# Patient Record
Sex: Female | Born: 1935 | Race: White | Hispanic: No | Smoking: Never smoker
Health system: Southern US, Community
[De-identification: ages and names within clinical notes are randomized; demographics above are authoritative.]

## PROBLEM LIST (undated history)

## (undated) DIAGNOSIS — E119 Type 2 diabetes mellitus without complications: Secondary | ICD-10-CM

## (undated) DIAGNOSIS — I10 Essential (primary) hypertension: Secondary | ICD-10-CM

## (undated) DIAGNOSIS — G9341 Metabolic encephalopathy: Secondary | ICD-10-CM

## (undated) DIAGNOSIS — I639 Cerebral infarction, unspecified: Secondary | ICD-10-CM

## (undated) DIAGNOSIS — T7840XA Allergy, unspecified, initial encounter: Secondary | ICD-10-CM

## (undated) DIAGNOSIS — E7800 Pure hypercholesterolemia, unspecified: Secondary | ICD-10-CM

## (undated) DIAGNOSIS — B019 Varicella without complication: Secondary | ICD-10-CM

## (undated) HISTORY — DX: Essential (primary) hypertension: I10

## (undated) HISTORY — DX: Cerebral infarction, unspecified: I63.9

## (undated) HISTORY — DX: Varicella without complication: B01.9

## (undated) HISTORY — DX: Allergy, unspecified, initial encounter: T78.40XA

## (undated) HISTORY — PX: APPENDECTOMY: SHX54

## (undated) HISTORY — DX: Type 2 diabetes mellitus without complications: E11.9

## (undated) HISTORY — DX: Pure hypercholesterolemia, unspecified: E78.00

---
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
## 1968-07-20 HISTORY — PX: DILATION AND CURETTAGE OF UTERUS: SHX78

## 1969-07-20 HISTORY — PX: ABDOMINAL HYSTERECTOMY: SHX81

## 2004-07-22 ENCOUNTER — Ambulatory Visit: Payer: Self-pay | Admitting: Internal Medicine

## 2004-11-14 ENCOUNTER — Ambulatory Visit: Payer: Self-pay | Admitting: Internal Medicine

## 2004-11-17 ENCOUNTER — Ambulatory Visit: Payer: Self-pay | Admitting: Internal Medicine

## 2005-02-03 ENCOUNTER — Ambulatory Visit: Payer: Self-pay | Admitting: Ophthalmology

## 2005-03-17 ENCOUNTER — Ambulatory Visit: Payer: Self-pay | Admitting: Ophthalmology

## 2005-03-25 ENCOUNTER — Ambulatory Visit: Payer: Self-pay | Admitting: Ophthalmology

## 2005-08-12 ENCOUNTER — Ambulatory Visit: Payer: Self-pay | Admitting: Internal Medicine

## 2006-07-21 LAB — HM COLONOSCOPY

## 2006-10-06 ENCOUNTER — Ambulatory Visit: Payer: Self-pay | Admitting: Internal Medicine

## 2006-10-28 ENCOUNTER — Ambulatory Visit: Payer: Self-pay | Admitting: Internal Medicine

## 2007-10-04 ENCOUNTER — Ambulatory Visit: Payer: Self-pay | Admitting: Internal Medicine

## 2007-10-11 ENCOUNTER — Ambulatory Visit: Payer: Self-pay | Admitting: Vascular Surgery

## 2007-11-23 ENCOUNTER — Ambulatory Visit: Payer: Self-pay | Admitting: Internal Medicine

## 2008-11-26 ENCOUNTER — Ambulatory Visit: Payer: Self-pay | Admitting: Internal Medicine

## 2010-01-02 ENCOUNTER — Ambulatory Visit: Payer: Self-pay | Admitting: Internal Medicine

## 2010-01-02 LAB — HM MAMMOGRAPHY

## 2011-01-05 ENCOUNTER — Ambulatory Visit: Payer: Self-pay | Admitting: Internal Medicine

## 2011-09-08 ENCOUNTER — Ambulatory Visit: Payer: Self-pay | Admitting: Internal Medicine

## 2012-07-11 ENCOUNTER — Ambulatory Visit: Payer: Self-pay | Admitting: Internal Medicine

## 2012-07-21 ENCOUNTER — Encounter: Payer: Self-pay | Admitting: Internal Medicine

## 2012-07-21 ENCOUNTER — Ambulatory Visit (INDEPENDENT_AMBULATORY_CARE_PROVIDER_SITE_OTHER): Payer: Medicare Other | Admitting: Internal Medicine

## 2012-07-21 VITALS — BP 146/82 | HR 74 | Temp 98.4°F | Ht 65.0 in | Wt 118.8 lb

## 2012-07-21 DIAGNOSIS — E119 Type 2 diabetes mellitus without complications: Secondary | ICD-10-CM

## 2012-07-21 DIAGNOSIS — I639 Cerebral infarction, unspecified: Secondary | ICD-10-CM

## 2012-07-21 DIAGNOSIS — R5381 Other malaise: Secondary | ICD-10-CM

## 2012-07-21 DIAGNOSIS — Z8673 Personal history of transient ischemic attack (TIA), and cerebral infarction without residual deficits: Secondary | ICD-10-CM | POA: Insufficient documentation

## 2012-07-21 DIAGNOSIS — I10 Essential (primary) hypertension: Secondary | ICD-10-CM

## 2012-07-21 DIAGNOSIS — I6350 Cerebral infarction due to unspecified occlusion or stenosis of unspecified cerebral artery: Secondary | ICD-10-CM

## 2012-07-21 DIAGNOSIS — E7800 Pure hypercholesterolemia, unspecified: Secondary | ICD-10-CM | POA: Insufficient documentation

## 2012-07-21 DIAGNOSIS — R5383 Other fatigue: Secondary | ICD-10-CM

## 2012-07-21 DIAGNOSIS — E1169 Type 2 diabetes mellitus with other specified complication: Secondary | ICD-10-CM | POA: Insufficient documentation

## 2012-07-21 LAB — CBC WITH DIFFERENTIAL/PLATELET
Basophils Absolute: 0 10*3/uL (ref 0.0–0.1)
Basophils Relative: 0.3 % (ref 0.0–3.0)
Eosinophils Absolute: 0.2 10*3/uL (ref 0.0–0.7)
Eosinophils Relative: 2.6 % (ref 0.0–5.0)
HCT: 46.8 % — ABNORMAL HIGH (ref 36.0–46.0)
Hemoglobin: 15.9 g/dL — ABNORMAL HIGH (ref 12.0–15.0)
Lymphocytes Relative: 25.6 % (ref 12.0–46.0)
Lymphs Abs: 1.9 10*3/uL (ref 0.7–4.0)
MCHC: 33.9 g/dL (ref 30.0–36.0)
MCV: 92.9 fl (ref 78.0–100.0)
Monocytes Absolute: 0.7 10*3/uL (ref 0.1–1.0)
Monocytes Relative: 9.3 % (ref 3.0–12.0)
Neutro Abs: 4.6 10*3/uL (ref 1.4–7.7)
Neutrophils Relative %: 62.2 % (ref 43.0–77.0)
Platelets: 124 10*3/uL — ABNORMAL LOW (ref 150.0–400.0)
RBC: 5.04 Mil/uL (ref 3.87–5.11)
RDW: 12.6 % (ref 11.5–14.6)
WBC: 7.4 10*3/uL (ref 4.5–10.5)

## 2012-07-21 LAB — LIPID PANEL
Cholesterol: 155 mg/dL (ref 0–200)
HDL: 41 mg/dL (ref >39.00)
LDL Cholesterol: 78 mg/dL (ref 0–99)
Total CHOL/HDL Ratio: 4
Triglycerides: 180 mg/dL — ABNORMAL HIGH (ref 0.0–149.0)
VLDL: 36 mg/dL (ref 0.0–40.0)

## 2012-07-21 LAB — HEPATIC FUNCTION PANEL
ALT: 18 U/L (ref 0–35)
AST: 25 U/L (ref 0–37)
Albumin: 4.2 g/dL (ref 3.5–5.2)
Alkaline Phosphatase: 77 U/L (ref 39–117)
Bilirubin, Direct: 0.1 mg/dL (ref 0.0–0.3)
Total Bilirubin: 1.2 mg/dL (ref 0.3–1.2)
Total Protein: 8.3 g/dL (ref 6.0–8.3)

## 2012-07-21 LAB — BASIC METABOLIC PANEL
BUN: 17 mg/dL (ref 6–23)
CO2: 28 mEq/L (ref 19–32)
Calcium: 9.5 mg/dL (ref 8.4–10.5)
Chloride: 101 mEq/L (ref 96–112)
Creatinine, Ser: 0.8 mg/dL (ref 0.4–1.2)
GFR: 76.1 mL/min (ref >60.00)
Glucose, Bld: 184 mg/dL — ABNORMAL HIGH (ref 70–99)
Potassium: 4.5 mEq/L (ref 3.5–5.1)
Sodium: 138 mEq/L (ref 135–145)

## 2012-07-21 LAB — TSH: TSH: 3.09 u[IU]/mL (ref 0.35–5.50)

## 2012-07-22 ENCOUNTER — Other Ambulatory Visit (INDEPENDENT_AMBULATORY_CARE_PROVIDER_SITE_OTHER): Payer: Medicare Other

## 2012-07-22 ENCOUNTER — Telehealth: Payer: Self-pay | Admitting: Internal Medicine

## 2012-07-22 DIAGNOSIS — E119 Type 2 diabetes mellitus without complications: Secondary | ICD-10-CM

## 2012-07-22 LAB — HEMOGLOBIN A1C: Hgb A1c MFr Bld: 8.5 % — ABNORMAL HIGH (ref 4.6–6.5)

## 2012-07-22 NOTE — Telephone Encounter (Signed)
I need to add an a1c to her labs drawn (07/21/12).  Dx 250.00.  Thanks.  She had a cbc drawn - so should be able to add.  I apparently accidentally put the a1c for a future order.  Let me know if a problem.

## 2012-07-23 ENCOUNTER — Telehealth: Payer: Self-pay | Admitting: Internal Medicine

## 2012-07-23 ENCOUNTER — Encounter: Payer: Self-pay | Admitting: Internal Medicine

## 2012-07-23 DIAGNOSIS — D696 Thrombocytopenia, unspecified: Secondary | ICD-10-CM

## 2012-07-23 NOTE — Assessment & Plan Note (Signed)
On simvastatin.  Check lipid panel and liver function.   

## 2012-07-23 NOTE — Assessment & Plan Note (Signed)
Blood pressure doing well.  Same medication regimen.  Check metabolic panel.  

## 2012-07-23 NOTE — Telephone Encounter (Signed)
Pt notified of lab results and need for a follow up platelet count.  Pt coming 08/01/12 at 10:00 for follow up lab.  Please put on lab schedule.  Pt aware of appt.

## 2012-07-23 NOTE — Progress Notes (Signed)
  Subjective:    Patient ID: Angela Patterson, female    DOB: 1921/06/16, 79 y.o.   MRN: [id number]  HPI 79 year old female with past history of hypertension, CVA, hypercholesterolemia and diabetes who comes in today for a scheduled follow up.  She states she feels good.  Stays active.  No cardiac symptoms with increased activity or exertion.  Breathing stable.  Bowels doing well.  States her sugars in the am have been running in the 140s.  Have tried her on diabetic medications in the past.   Does not feel as good on these medications.  Overall she feels she is doing well.    Past Medical History  Diagnosis Date  . Hypertension   . Hypercholesterolemia   . Diabetes mellitus   . CVA (cerebral vascular accident)   . Chicken pox   . Allergy     Current Outpatient Prescriptions on File Prior to Visit  Medication Sig Dispense Refill  . Calcium Carbonate-Vitamin D (CALCIUM 600+D) 600-400 MG-UNIT per tablet Take 1 tablet by mouth 2 (two) times daily.      . felodipine (PLENDIL) 5 MG 24 hr tablet Take 5 mg by mouth 2 (two) times daily.      . hydrochlorothiazide (HYDRODIURIL) 25 MG tablet Take 25 mg by mouth daily.      . simvastatin (ZOCOR) 40 MG tablet Take 40 mg by mouth every evening.        Review of Systems Patient denies any headache, lightheadedness or dizziness.  No significant sinus or allergy symptoms.  No chest pain, tightness or palpitations.  No increased shortness of breath, cough or congestion.  No nausea or vomiting.  No abdominal pain or cramping.  No bowel change, such as diarrhea, constipation, BRBPR or melana.  No urine change.        Objective:   Physical Exam Filed Vitals:   07/21/12 1011  BP: 146/82  Pulse: 74  Temp: 98.4 F (36.9 C)   Blood pressure recheck:  49/71  79 year old female in no acute distress.   HEENT:  Nares - clear.  OP- without lesions or erythema.  NECK:  Supple, nontender.  No audible bruit.   HEART:  Appears to be regular. LUNGS:  Without  crackles or wheezing audible.  Respirations even and unlabored.   RADIAL PULSE:  Equal bilaterally.  ABDOMEN:  Soft, nontender.  No audible abdominal bruit.   EXTREMITIES:  No increased edema to be present.                    Assessment & Plan:  CARDIOVASCULAR.  Asymptomatic.  Continue risk factor modification.   HEALTH MAINTENANCE.  Will obtain outside records for review.  Schedule her for her physical.

## 2012-07-23 NOTE — Assessment & Plan Note (Signed)
Low carb diet.  Did not tolerate various diabetic medications.  Will allow her to run a little higher.  She feels good.  Follow. Check metabolic panel and a1c.    

## 2012-07-23 NOTE — Assessment & Plan Note (Signed)
Currently doing well.  Has had no reoccurring problems.  Continue plavix.   

## 2012-07-25 NOTE — Telephone Encounter (Signed)
Appointment made

## 2012-08-01 ENCOUNTER — Other Ambulatory Visit (INDEPENDENT_AMBULATORY_CARE_PROVIDER_SITE_OTHER): Payer: Medicare Other

## 2012-08-01 ENCOUNTER — Other Ambulatory Visit: Payer: Self-pay | Admitting: Internal Medicine

## 2012-08-01 DIAGNOSIS — D696 Thrombocytopenia, unspecified: Secondary | ICD-10-CM

## 2012-08-01 LAB — CBC WITH DIFFERENTIAL/PLATELET
Basophils Absolute: 0 10*3/uL (ref 0.0–0.1)
Basophils Relative: 0.5 % (ref 0.0–3.0)
Eosinophils Absolute: 0.1 10*3/uL (ref 0.0–0.7)
Eosinophils Relative: 2.2 % (ref 0.0–5.0)
HCT: 47.9 % — ABNORMAL HIGH (ref 36.0–46.0)
Hemoglobin: 16.1 g/dL — ABNORMAL HIGH (ref 12.0–15.0)
Lymphocytes Relative: 30 % (ref 12.0–46.0)
Lymphs Abs: 2 10*3/uL (ref 0.7–4.0)
MCHC: 33.6 g/dL (ref 30.0–36.0)
MCV: 92.7 fl (ref 78.0–100.0)
Monocytes Absolute: 0.6 10*3/uL (ref 0.1–1.0)
Monocytes Relative: 9.5 % (ref 3.0–12.0)
Neutro Abs: 3.9 10*3/uL (ref 1.4–7.7)
Neutrophils Relative %: 57.8 % (ref 43.0–77.0)
Platelets: 142 10*3/uL — ABNORMAL LOW (ref 150.0–400.0)
RBC: 5.17 Mil/uL — ABNORMAL HIGH (ref 3.87–5.11)
RDW: 12.9 % (ref 11.5–14.6)
WBC: 6.8 10*3/uL (ref 4.5–10.5)

## 2012-08-01 NOTE — Progress Notes (Signed)
Order placed for follow up lab.  

## 2012-08-04 ENCOUNTER — Encounter: Payer: Self-pay | Admitting: *Deleted

## 2012-09-06 ENCOUNTER — Other Ambulatory Visit: Payer: Self-pay | Admitting: Internal Medicine

## 2012-09-06 NOTE — Telephone Encounter (Signed)
Sent in to pharmacy.  

## 2012-09-15 ENCOUNTER — Other Ambulatory Visit: Payer: Medicare Other

## 2012-09-20 ENCOUNTER — Other Ambulatory Visit: Payer: Medicare Other

## 2012-09-27 ENCOUNTER — Other Ambulatory Visit (INDEPENDENT_AMBULATORY_CARE_PROVIDER_SITE_OTHER): Payer: Medicare Other

## 2012-09-27 DIAGNOSIS — D696 Thrombocytopenia, unspecified: Secondary | ICD-10-CM

## 2012-09-27 LAB — CBC WITH DIFFERENTIAL/PLATELET
Basophils Absolute: 0 10*3/uL (ref 0.0–0.1)
Basophils Relative: 0.6 % (ref 0.0–3.0)
Eosinophils Absolute: 0.1 10*3/uL (ref 0.0–0.7)
Eosinophils Relative: 2 % (ref 0.0–5.0)
HCT: 47.4 % — ABNORMAL HIGH (ref 36.0–46.0)
Hemoglobin: 16.1 g/dL — ABNORMAL HIGH (ref 12.0–15.0)
Lymphocytes Relative: 31.3 % (ref 12.0–46.0)
Lymphs Abs: 2.1 10*3/uL (ref 0.7–4.0)
MCHC: 34 g/dL (ref 30.0–36.0)
MCV: 92.5 fl (ref 78.0–100.0)
Monocytes Absolute: 0.6 10*3/uL (ref 0.1–1.0)
Monocytes Relative: 8.5 % (ref 3.0–12.0)
Neutro Abs: 3.8 10*3/uL (ref 1.4–7.7)
Neutrophils Relative %: 57.6 % (ref 43.0–77.0)
Platelets: 137 10*3/uL — ABNORMAL LOW (ref 150.0–400.0)
RBC: 5.13 Mil/uL — ABNORMAL HIGH (ref 3.87–5.11)
RDW: 13 % (ref 11.5–14.6)
WBC: 6.6 10*3/uL (ref 4.5–10.5)

## 2012-10-06 ENCOUNTER — Other Ambulatory Visit: Payer: Self-pay | Admitting: Internal Medicine

## 2012-10-06 NOTE — Telephone Encounter (Signed)
Sent in to pharmacy.  

## 2012-11-18 ENCOUNTER — Ambulatory Visit (INDEPENDENT_AMBULATORY_CARE_PROVIDER_SITE_OTHER): Payer: Medicare Other | Admitting: Internal Medicine

## 2012-11-18 ENCOUNTER — Encounter: Payer: Self-pay | Admitting: Internal Medicine

## 2012-11-18 VITALS — BP 138/80 | HR 74 | Temp 97.7°F | Ht 62.25 in | Wt 115.5 lb

## 2012-11-18 DIAGNOSIS — I10 Essential (primary) hypertension: Secondary | ICD-10-CM

## 2012-11-18 DIAGNOSIS — I639 Cerebral infarction, unspecified: Secondary | ICD-10-CM

## 2012-11-18 DIAGNOSIS — E7800 Pure hypercholesterolemia, unspecified: Secondary | ICD-10-CM

## 2012-11-18 DIAGNOSIS — D696 Thrombocytopenia, unspecified: Secondary | ICD-10-CM

## 2012-11-18 DIAGNOSIS — I6350 Cerebral infarction due to unspecified occlusion or stenosis of unspecified cerebral artery: Secondary | ICD-10-CM

## 2012-11-18 DIAGNOSIS — E119 Type 2 diabetes mellitus without complications: Secondary | ICD-10-CM

## 2012-11-18 LAB — HEMOGLOBIN A1C: Hgb A1c MFr Bld: 8.5 % — ABNORMAL HIGH (ref 4.6–6.5)

## 2012-11-20 ENCOUNTER — Encounter: Payer: Self-pay | Admitting: Internal Medicine

## 2012-11-20 DIAGNOSIS — D696 Thrombocytopenia, unspecified: Secondary | ICD-10-CM | POA: Insufficient documentation

## 2012-11-20 NOTE — Assessment & Plan Note (Signed)
Low carb diet.  Did not tolerate various diabetic medications.  Will allow her to run a little higher.  She feels good.  Follow. Check metabolic panel and a1c.    

## 2012-11-20 NOTE — Progress Notes (Signed)
Subjective:    Patient ID: Angela Patterson, female    DOB: 06-02-1921, 79 y.o.   MRN: [id number]  HPI 79 year old female with past history of hypertension, CVA, hypercholesterolemia and diabetes who comes in today to follow up on these issues as well as for a complete physical exam.  She states she feels good.  Stays active.  No cardiac symptoms with increased activity or exertion.  Breathing stable.  Bowels doing well.  States her sugars are doing well.  She brought in no recorded sugar readings.  Have tried her on diabetic medications in the past.   Does not feel as good on these medications.  Overall she feels she is doing well.  Has been under increased stress recently.  A good friend of hers just recently passed away unexpectedly.  Also, another good friend - is in Hospice and her brother is sick.  She feels she is coping relatively well.  Does not feel she needs any further intervention.    Past Medical History  Diagnosis Date  . Hypertension   . Hypercholesterolemia   . Diabetes mellitus   . CVA (cerebral vascular accident)   . Chicken pox   . Allergy     Current Outpatient Prescriptions on File Prior to Visit  Medication Sig Dispense Refill  . aspirin 81 MG tablet Take 81 mg by mouth daily.      . Calcium Carbonate-Vitamin D (CALCIUM 600+D) 600-400 MG-UNIT per tablet Take 1 tablet by mouth 2 (two) times daily.      . Cholecalciferol (VITAMIN D3) 2000 UNITS TABS Take 1 tablet by mouth daily.      . clopidogrel (PLAVIX) 75 MG tablet TAKE ONE (1) TABLET EACH DAY  30 tablet  5  . felodipine (PLENDIL) 5 MG 24 hr tablet TAKE ONE TABLET BY MOUTH TWICE DAILY  60 tablet  5  . fish oil-omega-3 fatty acids 1000 MG capsule Take 2 g by mouth daily.      Marland Kitchen glucose blood test strip Contour test strips Check blood sugar bid      . hydrochlorothiazide (HYDRODIURIL) 25 MG tablet Take 25 mg by mouth daily.      . Multiple Vitamin (MULTIVITAMIN) tablet Take 1 tablet by mouth daily.      . simvastatin  (ZOCOR) 40 MG tablet Take 40 mg by mouth every evening.      . timolol (BETIMOL) 0.5 % ophthalmic solution 1 drop 2 (two) times daily.       No current facility-administered medications on file prior to visit.    Review of Systems Patient denies any headache, lightheadedness or dizziness.  No significant sinus or allergy symptoms.  No chest pain, tightness or palpitations.  No increased shortness of breath, cough or congestion.  No nausea or vomiting.  No abdominal pain or cramping.  No bowel change, such as diarrhea, constipation, BRBPR or melana.  No urine change.  Coping well with the increased stress. Overall she feels she is dong well.      Objective:   Physical Exam  Filed Vitals:   11/18/12 1021  BP: 138/80  Pulse: 74  Temp: 97.7 F (36.5 C)   Blood pressure recheck:  42-4/22  79 year old female in no acute distress.   HEENT:  Nares- clear.  Oropharynx - without lesions. NECK:  Supple.  Nontender.  No audible bruit.  HEART:  Appears to be regular. LUNGS:  No crackles or wheezing audible.  Respirations even and unlabored.  RADIAL  PULSE:  Equal bilaterally.    BREASTS:  No nipple discharge or nipple retraction present.  Could not appreciate any distinct nodules or axillary adenopathy.  ABDOMEN:  Soft, nontender.  Bowel sounds present and normal.  No audible abdominal bruit.  GU:  She deferred.    RECTAL:  She deferred.  EXTREMITIES:  No increased edema present.  DP pulses palpable and equal bilaterally.           Assessment & Plan:  CARDIOVASCULAR.  Asymptomatic.  Continue risk factor modification.   INCREASED PSYCHOSOCIAL STRESSORS.  She feels she is doing well.  Handling stress well.  Desires no further intervention.  Will notify me if she feels she needs anything more.   HEALTH MAINTENANCE.  Physical today.  She declines mammogram and further screening.

## 2012-11-20 NOTE — Assessment & Plan Note (Signed)
On simvastatin.  Check lipid panel and liver function.   

## 2012-11-20 NOTE — Assessment & Plan Note (Signed)
Has been relatively stable.  Recheck cbc.   

## 2012-11-20 NOTE — Assessment & Plan Note (Signed)
Currently doing well.  Has had no reoccurring problems.  Continue plavix.   

## 2012-11-20 NOTE — Assessment & Plan Note (Signed)
Blood pressure doing well.  Same medication regimen.  Check metabolic panel.  

## 2012-11-21 LAB — LIPID PANEL
Cholesterol: 158 mg/dL (ref 0–200)
HDL: 47.9 mg/dL (ref >39.00)
LDL Cholesterol: 75 mg/dL (ref 0–99)
Total CHOL/HDL Ratio: 3
Triglycerides: 174 mg/dL — ABNORMAL HIGH (ref 0.0–149.0)
VLDL: 34.8 mg/dL (ref 0.0–40.0)

## 2012-11-21 LAB — BASIC METABOLIC PANEL
BUN: 18 mg/dL (ref 6–23)
CO2: 29 mEq/L (ref 19–32)
Calcium: 9.8 mg/dL (ref 8.4–10.5)
Chloride: 100 mEq/L (ref 96–112)
Creatinine, Ser: 0.8 mg/dL (ref 0.4–1.2)
GFR: 68.69 mL/min (ref >60.00)
Glucose, Bld: 227 mg/dL — ABNORMAL HIGH (ref 70–99)
Potassium: 4.6 mEq/L (ref 3.5–5.1)
Sodium: 138 mEq/L (ref 135–145)

## 2012-11-21 LAB — HEPATIC FUNCTION PANEL
ALT: 15 U/L (ref 0–35)
AST: 26 U/L (ref 0–37)
Albumin: 4.7 g/dL (ref 3.5–5.2)
Alkaline Phosphatase: 83 U/L (ref 39–117)
Bilirubin, Direct: 0.2 mg/dL (ref 0.0–0.3)
Total Bilirubin: 1 mg/dL (ref 0.3–1.2)
Total Protein: 9 g/dL — ABNORMAL HIGH (ref 6.0–8.3)

## 2012-11-26 ENCOUNTER — Telehealth: Payer: Self-pay | Admitting: Internal Medicine

## 2012-11-26 ENCOUNTER — Other Ambulatory Visit: Payer: Self-pay | Admitting: Internal Medicine

## 2012-11-26 DIAGNOSIS — E8809 Other disorders of plasma-protein metabolism, not elsewhere classified: Secondary | ICD-10-CM

## 2012-11-26 DIAGNOSIS — D696 Thrombocytopenia, unspecified: Secondary | ICD-10-CM

## 2012-11-26 NOTE — Telephone Encounter (Signed)
Pt notified of lab results and need for f/u lab in a couple of weeks.  She is planning to come in on 12/07/12 at 10:00 for lab.  Please put on lab schedule.  Pt aware of appt.  Thanks.

## 2012-11-26 NOTE — Progress Notes (Signed)
Order placed for follow up liver panel, cbc and SIEP.

## 2012-11-28 NOTE — Telephone Encounter (Signed)
Appointment made

## 2012-12-07 ENCOUNTER — Other Ambulatory Visit (INDEPENDENT_AMBULATORY_CARE_PROVIDER_SITE_OTHER): Payer: Medicare Other

## 2012-12-07 DIAGNOSIS — E8809 Other disorders of plasma-protein metabolism, not elsewhere classified: Secondary | ICD-10-CM

## 2012-12-07 DIAGNOSIS — D696 Thrombocytopenia, unspecified: Secondary | ICD-10-CM

## 2012-12-07 LAB — HEPATIC FUNCTION PANEL
ALT: 18 U/L (ref 0–35)
AST: 22 U/L (ref 0–37)
Albumin: 3.9 g/dL (ref 3.5–5.2)
Alkaline Phosphatase: 73 U/L (ref 39–117)
Bilirubin, Direct: 0.2 mg/dL (ref 0.0–0.3)
Total Bilirubin: 1.2 mg/dL (ref 0.3–1.2)
Total Protein: 7.1 g/dL (ref 6.0–8.3)

## 2012-12-07 LAB — CBC WITH DIFFERENTIAL/PLATELET
Basophils Absolute: 0 10*3/uL (ref 0.0–0.1)
Basophils Relative: 0.4 % (ref 0.0–3.0)
Eosinophils Absolute: 0.1 10*3/uL (ref 0.0–0.7)
Eosinophils Relative: 1.3 % (ref 0.0–5.0)
HCT: 44 % (ref 36.0–46.0)
Hemoglobin: 15 g/dL (ref 12.0–15.0)
Lymphocytes Relative: 23.2 % (ref 12.0–46.0)
Lymphs Abs: 2.2 10*3/uL (ref 0.7–4.0)
MCHC: 34.1 g/dL (ref 30.0–36.0)
MCV: 92.1 fl (ref 78.0–100.0)
Monocytes Absolute: 0.7 10*3/uL (ref 0.1–1.0)
Monocytes Relative: 7.9 % (ref 3.0–12.0)
Neutro Abs: 6.2 10*3/uL (ref 1.4–7.7)
Neutrophils Relative %: 67.2 % (ref 43.0–77.0)
Platelets: 121 10*3/uL — ABNORMAL LOW (ref 150.0–400.0)
RBC: 4.77 Mil/uL (ref 3.87–5.11)
RDW: 12.9 % (ref 11.5–14.6)
WBC: 9.3 10*3/uL (ref 4.5–10.5)

## 2012-12-09 LAB — PROTEIN ELECTROPHORESIS, SERUM
Albumin ELP: 54.5 % — ABNORMAL LOW (ref 55.8–66.1)
Alpha-1-Globulin: 3.6 % (ref 2.9–4.9)
Alpha-2-Globulin: 12.9 % — ABNORMAL HIGH (ref 7.1–11.8)
Beta 2: 8.2 % — ABNORMAL HIGH (ref 3.2–6.5)
Beta Globulin: 6.9 % (ref 4.7–7.2)
Gamma Globulin: 13.9 % (ref 11.1–18.8)
Total Protein, Serum Electrophoresis: 7.3 g/dL (ref 6.0–8.3)

## 2012-12-11 ENCOUNTER — Telehealth: Payer: Self-pay | Admitting: Internal Medicine

## 2012-12-11 DIAGNOSIS — D696 Thrombocytopenia, unspecified: Secondary | ICD-10-CM

## 2012-12-11 NOTE — Telephone Encounter (Signed)
Pt notified of lab results and need for f/u platelet count check in one month.  Pt coming in 01/11/13 at 9:30 for repeat lab.  Please put on lab schedule.  Pt aware of appt date and time.  Thanks.

## 2012-12-14 NOTE — Telephone Encounter (Signed)
Appointment made

## 2012-12-26 ENCOUNTER — Other Ambulatory Visit: Payer: Self-pay | Admitting: Internal Medicine

## 2013-01-11 ENCOUNTER — Other Ambulatory Visit (INDEPENDENT_AMBULATORY_CARE_PROVIDER_SITE_OTHER): Payer: Medicare Other

## 2013-01-11 DIAGNOSIS — D696 Thrombocytopenia, unspecified: Secondary | ICD-10-CM

## 2013-01-12 LAB — PLATELET COUNT: Platelets: 157 10*3/uL (ref 150–400)

## 2013-01-30 ENCOUNTER — Encounter: Payer: Self-pay | Admitting: Internal Medicine

## 2013-02-18 ENCOUNTER — Other Ambulatory Visit: Payer: Self-pay | Admitting: Internal Medicine

## 2013-03-28 ENCOUNTER — Other Ambulatory Visit: Payer: Self-pay | Admitting: Internal Medicine

## 2013-05-17 LAB — HM DIABETES EYE EXAM

## 2013-05-24 ENCOUNTER — Ambulatory Visit (INDEPENDENT_AMBULATORY_CARE_PROVIDER_SITE_OTHER): Payer: Medicare Other | Admitting: Internal Medicine

## 2013-05-24 ENCOUNTER — Encounter: Payer: Self-pay | Admitting: Internal Medicine

## 2013-05-24 VITALS — BP 130/70 | HR 72 | Temp 97.8°F | Ht 62.25 in | Wt 115.2 lb

## 2013-05-24 DIAGNOSIS — I639 Cerebral infarction, unspecified: Secondary | ICD-10-CM

## 2013-05-24 DIAGNOSIS — I6350 Cerebral infarction due to unspecified occlusion or stenosis of unspecified cerebral artery: Secondary | ICD-10-CM

## 2013-05-24 DIAGNOSIS — E119 Type 2 diabetes mellitus without complications: Secondary | ICD-10-CM

## 2013-05-24 DIAGNOSIS — I10 Essential (primary) hypertension: Secondary | ICD-10-CM

## 2013-05-24 DIAGNOSIS — E7800 Pure hypercholesterolemia, unspecified: Secondary | ICD-10-CM

## 2013-05-24 DIAGNOSIS — D696 Thrombocytopenia, unspecified: Secondary | ICD-10-CM

## 2013-05-24 LAB — LIPID PANEL
Cholesterol: 128 mg/dL (ref 0–200)
HDL: 42.4 mg/dL (ref >39.00)
Total CHOL/HDL Ratio: 3
Triglycerides: 206 mg/dL — ABNORMAL HIGH (ref 0.0–149.0)
VLDL: 41.2 mg/dL — ABNORMAL HIGH (ref 0.0–40.0)

## 2013-05-24 LAB — CBC WITH DIFFERENTIAL/PLATELET
Basophils Absolute: 0.1 10*3/uL (ref 0.0–0.1)
Basophils Relative: 0.7 % (ref 0.0–3.0)
Eosinophils Absolute: 0.1 10*3/uL (ref 0.0–0.7)
Eosinophils Relative: 1 % (ref 0.0–5.0)
HCT: 45.4 % (ref 36.0–46.0)
Hemoglobin: 15.6 g/dL — ABNORMAL HIGH (ref 12.0–15.0)
Lymphocytes Relative: 28.3 % (ref 12.0–46.0)
Lymphs Abs: 2.4 10*3/uL (ref 0.7–4.0)
MCHC: 34.3 g/dL (ref 30.0–36.0)
MCV: 92.3 fl (ref 78.0–100.0)
Monocytes Absolute: 0.7 10*3/uL (ref 0.1–1.0)
Monocytes Relative: 8.1 % (ref 3.0–12.0)
Neutro Abs: 5.2 10*3/uL (ref 1.4–7.7)
Neutrophils Relative %: 61.9 % (ref 43.0–77.0)
Platelets: 124 10*3/uL — ABNORMAL LOW (ref 150.0–400.0)
RBC: 4.92 Mil/uL (ref 3.87–5.11)
RDW: 12.2 % (ref 11.5–14.6)
WBC: 8.5 10*3/uL (ref 4.5–10.5)

## 2013-05-24 LAB — LDL CHOLESTEROL, DIRECT: Direct LDL: 66.4 mg/dL

## 2013-05-24 LAB — MICROALBUMIN / CREATININE URINE RATIO
Creatinine,U: 120.8 mg/dL
Microalb Creat Ratio: 1.5 mg/g (ref 0.0–30.0)
Microalb, Ur: 1.8 mg/dL (ref 0.0–1.9)

## 2013-05-24 LAB — HEPATIC FUNCTION PANEL
ALT: 27 U/L (ref 0–35)
AST: 28 U/L (ref 0–37)
Albumin: 4.4 g/dL (ref 3.5–5.2)
Alkaline Phosphatase: 68 U/L (ref 39–117)
Bilirubin, Direct: 0.2 mg/dL (ref 0.0–0.3)
Total Bilirubin: 1.1 mg/dL (ref 0.3–1.2)
Total Protein: 8.4 g/dL — ABNORMAL HIGH (ref 6.0–8.3)

## 2013-05-24 LAB — HEMOGLOBIN A1C: Hgb A1c MFr Bld: 8.5 % — ABNORMAL HIGH (ref 4.6–6.5)

## 2013-05-24 LAB — HM DIABETES FOOT EXAM

## 2013-05-24 LAB — BASIC METABOLIC PANEL
BUN: 15 mg/dL (ref 6–23)
CO2: 28 mEq/L (ref 19–32)
Calcium: 10 mg/dL (ref 8.4–10.5)
Chloride: 100 mEq/L (ref 96–112)
Creatinine, Ser: 0.6 mg/dL (ref 0.4–1.2)
GFR: 97.89 mL/min (ref >60.00)
Glucose, Bld: 172 mg/dL — ABNORMAL HIGH (ref 70–99)
Potassium: 3.9 mEq/L (ref 3.5–5.1)
Sodium: 137 mEq/L (ref 135–145)

## 2013-05-24 NOTE — Progress Notes (Signed)
Pre-visit discussion using our clinic review tool. No additional management support is needed unless otherwise documented below in the visit note.  

## 2013-05-25 ENCOUNTER — Other Ambulatory Visit: Payer: Self-pay | Admitting: Internal Medicine

## 2013-05-25 ENCOUNTER — Encounter: Payer: Self-pay | Admitting: *Deleted

## 2013-05-25 DIAGNOSIS — D696 Thrombocytopenia, unspecified: Secondary | ICD-10-CM

## 2013-05-25 DIAGNOSIS — E8809 Other disorders of plasma-protein metabolism, not elsewhere classified: Secondary | ICD-10-CM

## 2013-05-25 NOTE — Progress Notes (Signed)
Order placed for f/u labs.  

## 2013-05-28 ENCOUNTER — Encounter: Payer: Self-pay | Admitting: Internal Medicine

## 2013-05-28 NOTE — Assessment & Plan Note (Signed)
Blood pressure doing well.  Same medication regimen.  Check metabolic panel.  

## 2013-05-28 NOTE — Assessment & Plan Note (Signed)
Low carb diet.  Did not tolerate various diabetic medications.  Will allow her to run a little higher.  She feels good.  Follow. Check metabolic panel and a1c.    

## 2013-05-28 NOTE — Assessment & Plan Note (Signed)
Currently doing well.  Has had no reoccurring problems.  Continue plavix.   

## 2013-05-28 NOTE — Assessment & Plan Note (Signed)
Has been relatively stable.  Recheck cbc.   

## 2013-05-28 NOTE — Assessment & Plan Note (Signed)
On simvastatin.  Check lipid panel and liver function.   

## 2013-05-28 NOTE — Progress Notes (Signed)
Subjective:    Patient ID: Angela Patterson, female    DOB: 02-25-1921, 79 y.o.   MRN: [id number]  HPI 79 year old female with past history of hypertension, CVA, hypercholesterolemia and diabetes who comes in today for a scheduled follow up.   She states she feels good.  Stays active.  No cardiac symptoms with increased activity or exertion. Breathing stable.  Bowels doing well.  States her sugars are doing well.  She brought in no recorded sugar readings.  Have tried her on diabetic medications in the past.   Does not feel as good on these medications.  Overall she feels she is doing well.  Handling stress well.  Had her eyes checked last week.  Sees Dr Angela Patterson.    Past Medical History  Diagnosis Date  . Hypertension   . Hypercholesterolemia   . Diabetes mellitus   . CVA (cerebral vascular accident)   . Chicken pox   . Allergy     Current Outpatient Prescriptions on File Prior to Visit  Medication Sig Dispense Refill  . aspirin 81 MG tablet Take 81 mg by mouth daily.      . Calcium Carbonate-Vitamin D (CALCIUM 600+D) 600-400 MG-UNIT per tablet Take 1 tablet by mouth 2 (two) times daily.      . Cholecalciferol (VITAMIN D3) 2000 UNITS TABS Take 1 tablet by mouth daily.      . clopidogrel (PLAVIX) 75 MG tablet TAKE ONE (1) TABLET BY MOUTH EVERY DAY  30 tablet  5  . felodipine (PLENDIL) 5 MG 24 hr tablet TAKE ONE TABLET TWICE DAILY  60 tablet  5  . fish oil-omega-3 fatty acids 1000 MG capsule Take 2 g by mouth daily.      Marland Kitchen glucose blood test strip TEST BLOOD GLUCOSE LEVELS TWICE DAILY  200 each  5  . hydrochlorothiazide (HYDRODIURIL) 25 MG tablet Take 25 mg by mouth daily.      . Multiple Vitamin (MULTIVITAMIN) tablet Take 1 tablet by mouth daily.      . simvastatin (ZOCOR) 40 MG tablet TAKE ONE TABLET DAILY AT BEDTIME  30 tablet  5  . timolol (BETIMOL) 0.5 % ophthalmic solution 1 drop 2 (two) times daily.       No current facility-administered medications on file prior to visit.     Review of Systems Patient denies any headache, lightheadedness or dizziness.  No significant sinus or allergy symptoms.  No chest pain, tightness or palpitations.  No increased shortness of breath, cough or congestion.  No nausea or vomiting.  No abdominal pain or cramping.  No bowel change, such as diarrhea, constipation, BRBPR or melana.  No urine change.  Coping well with the increased stress. Overall she feels she is dong well.      Objective:   Physical Exam  Filed Vitals:   05/24/13 1100  BP: 130/70  Pulse: 72  Temp: 97.8 F (36.6 C)   Blood pressure recheck:  63/61  79 year old female in no acute distress.   HEENT:  Nares- clear.  Oropharynx - without lesions. NECK:  Supple.  Nontender.  No audible bruit.  HEART:  Appears to be regular. LUNGS:  No crackles or wheezing audible.  Respirations even and unlabored.  RADIAL PULSE:  Equal bilaterally.     ABDOMEN:  Soft, nontender.  Bowel sounds present and normal.  No audible abdominal bruit.   EXTREMITIES:  No increased edema present.  DP pulses palpable and equal bilaterally.   FEET:  Without  lesions.          Assessment & Plan:  CARDIOVASCULAR.  Asymptomatic.  Continue risk factor modification.   INCREASED PSYCHOSOCIAL STRESSORS.  She feels she is doing well.  Handling stress well.  Desires no further intervention.  Will notify me if she feels she needs anything more.   HEALTH MAINTENANCE.  Physical 11/18/12.  She declines mammogram and further screening.

## 2013-07-06 ENCOUNTER — Other Ambulatory Visit (INDEPENDENT_AMBULATORY_CARE_PROVIDER_SITE_OTHER): Payer: Medicare Other

## 2013-07-06 DIAGNOSIS — D696 Thrombocytopenia, unspecified: Secondary | ICD-10-CM

## 2013-07-06 DIAGNOSIS — E8809 Other disorders of plasma-protein metabolism, not elsewhere classified: Secondary | ICD-10-CM

## 2013-07-06 LAB — CBC WITH DIFFERENTIAL/PLATELET
Basophils Absolute: 0 10*3/uL (ref 0.0–0.1)
Basophils Relative: 0.5 % (ref 0.0–3.0)
Eosinophils Absolute: 0.1 10*3/uL (ref 0.0–0.7)
Eosinophils Relative: 1.7 % (ref 0.0–5.0)
HCT: 45.7 % (ref 36.0–46.0)
Hemoglobin: 15.5 g/dL — ABNORMAL HIGH (ref 12.0–15.0)
Lymphocytes Relative: 28 % (ref 12.0–46.0)
Lymphs Abs: 2.2 10*3/uL (ref 0.7–4.0)
MCHC: 33.9 g/dL (ref 30.0–36.0)
MCV: 93.2 fl (ref 78.0–100.0)
Monocytes Absolute: 0.7 10*3/uL (ref 0.1–1.0)
Monocytes Relative: 8.9 % (ref 3.0–12.0)
Neutro Abs: 4.8 10*3/uL (ref 1.4–7.7)
Neutrophils Relative %: 60.9 % (ref 43.0–77.0)
Platelets: 140 10*3/uL — ABNORMAL LOW (ref 150.0–400.0)
RBC: 4.91 Mil/uL (ref 3.87–5.11)
RDW: 12.9 % (ref 11.5–14.6)
WBC: 7.8 10*3/uL (ref 4.5–10.5)

## 2013-07-06 LAB — PROTEIN, TOTAL: Total Protein: 7.9 g/dL (ref 6.0–8.3)

## 2013-07-07 ENCOUNTER — Encounter: Payer: Self-pay | Admitting: *Deleted

## 2013-07-26 ENCOUNTER — Other Ambulatory Visit: Payer: Self-pay | Admitting: Internal Medicine

## 2013-08-07 ENCOUNTER — Telehealth: Payer: Self-pay | Admitting: Internal Medicine

## 2013-08-07 NOTE — Telephone Encounter (Signed)
Please advise see below.

## 2013-08-07 NOTE — Telephone Encounter (Signed)
It is ok to change her felodipine (plendil) to amlodipine 5mg  bid.

## 2013-08-07 NOTE — Telephone Encounter (Signed)
Asking [place] to call: Angela Patterson DOB 02/12/1966, granddaughter  Pt [phone]  States if there is a problem with HIPAA to please call the pt, but is afraid the pt may get confused.    States the pt is on felodipine, which is a tier 3 drug with her insurance company.  States they have spoken with her insurance company and were given suggestions of: Nifedipine EX - tier 2 Amlodipine immediate release - tier 1  Has allergic reactions to some meds but is concerned about reactions.  Would like Dr. Nicki Reaper to check into this and see if it is possible to switch the medication for cost reasons, but be sure she will not have a reaction.  Pt had f/u w/ Dr. Regarding her foot.  She is not sure of the outcome at this point.

## 2013-08-09 ENCOUNTER — Other Ambulatory Visit: Payer: Self-pay | Admitting: *Deleted

## 2013-08-09 MED ORDER — AMLODIPINE BESYLATE 5 MG PO TABS: 5.0000 mg | ORAL_TABLET | Freq: Two times a day (BID) | ORAL | Status: DC

## 2013-08-09 NOTE — Telephone Encounter (Signed)
Sent in Amlodipine to pharmacy & granddaughter aware

## 2013-08-18 ENCOUNTER — Other Ambulatory Visit: Payer: Self-pay | Admitting: *Deleted

## 2013-08-18 MED ORDER — PRAVASTATIN SODIUM 40 MG PO TABS: 40.0000 mg | ORAL_TABLET | Freq: Every day | ORAL | Status: DC

## 2013-09-11 ENCOUNTER — Telehealth: Payer: Self-pay | Admitting: *Deleted

## 2013-09-11 NOTE — Telephone Encounter (Signed)
C/O leg cramps-both legs. since changing two of her medications. (Amlodipine & Pravastatin). She has been on them 1 week now. Please advise

## 2013-09-11 NOTE — Telephone Encounter (Signed)
The amlodipine is the same type of medication she had been taking.  The cholesterol medication is more likely to cause cramps.  I would have her hold the pravastatin over the next few weeks.  See if cramps subside.  Will need to let us know either way.

## 2013-09-11 NOTE — Telephone Encounter (Signed)
Pt notified & will give an update at upcoming appt on 09/25/13

## 2013-09-25 ENCOUNTER — Ambulatory Visit (INDEPENDENT_AMBULATORY_CARE_PROVIDER_SITE_OTHER): Payer: Medicare Other | Admitting: Internal Medicine

## 2013-09-25 ENCOUNTER — Other Ambulatory Visit: Payer: Self-pay | Admitting: Internal Medicine

## 2013-09-25 ENCOUNTER — Encounter: Payer: Self-pay | Admitting: Internal Medicine

## 2013-09-25 VITALS — BP 130/70 | HR 68 | Temp 97.8°F | Ht 62.25 in | Wt 110.2 lb

## 2013-09-25 DIAGNOSIS — D696 Thrombocytopenia, unspecified: Secondary | ICD-10-CM

## 2013-09-25 DIAGNOSIS — R5383 Other fatigue: Secondary | ICD-10-CM

## 2013-09-25 DIAGNOSIS — E7800 Pure hypercholesterolemia, unspecified: Secondary | ICD-10-CM

## 2013-09-25 DIAGNOSIS — I10 Essential (primary) hypertension: Secondary | ICD-10-CM

## 2013-09-25 DIAGNOSIS — I6350 Cerebral infarction due to unspecified occlusion or stenosis of unspecified cerebral artery: Secondary | ICD-10-CM

## 2013-09-25 DIAGNOSIS — I639 Cerebral infarction, unspecified: Secondary | ICD-10-CM

## 2013-09-25 DIAGNOSIS — R5381 Other malaise: Secondary | ICD-10-CM

## 2013-09-25 DIAGNOSIS — E119 Type 2 diabetes mellitus without complications: Secondary | ICD-10-CM

## 2013-09-25 LAB — CBC WITH DIFFERENTIAL/PLATELET
Basophils Absolute: 0.1 10*3/uL (ref 0.0–0.1)
Basophils Relative: 0.7 % (ref 0.0–3.0)
Eosinophils Absolute: 0.2 10*3/uL (ref 0.0–0.7)
Eosinophils Relative: 2.5 % (ref 0.0–5.0)
HCT: 46.5 % — ABNORMAL HIGH (ref 36.0–46.0)
Hemoglobin: 15.5 g/dL — ABNORMAL HIGH (ref 12.0–15.0)
Lymphocytes Relative: 23.3 % (ref 12.0–46.0)
Lymphs Abs: 1.7 10*3/uL (ref 0.7–4.0)
MCHC: 33.3 g/dL (ref 30.0–36.0)
MCV: 93.8 fl (ref 78.0–100.0)
Monocytes Absolute: 0.6 10*3/uL (ref 0.1–1.0)
Monocytes Relative: 8.8 % (ref 3.0–12.0)
Neutro Abs: 4.6 10*3/uL (ref 1.4–7.7)
Neutrophils Relative %: 64.7 % (ref 43.0–77.0)
Platelets: 117 10*3/uL — ABNORMAL LOW (ref 150.0–400.0)
RBC: 4.96 Mil/uL (ref 3.87–5.11)
RDW: 13.1 % (ref 11.5–14.6)
WBC: 7.1 10*3/uL (ref 4.5–10.5)

## 2013-09-25 LAB — BASIC METABOLIC PANEL
BUN: 11 mg/dL (ref 6–23)
CO2: 28 mEq/L (ref 19–32)
Calcium: 9.7 mg/dL (ref 8.4–10.5)
Chloride: 99 mEq/L (ref 96–112)
Creatinine, Ser: 0.7 mg/dL (ref 0.4–1.2)
GFR: 87.78 mL/min (ref >60.00)
Glucose, Bld: 251 mg/dL — ABNORMAL HIGH (ref 70–99)
Potassium: 4.3 mEq/L (ref 3.5–5.1)
Sodium: 137 mEq/L (ref 135–145)

## 2013-09-25 LAB — HEPATIC FUNCTION PANEL
ALT: 24 U/L (ref 0–35)
AST: 26 U/L (ref 0–37)
Albumin: 4.1 g/dL (ref 3.5–5.2)
Alkaline Phosphatase: 65 U/L (ref 39–117)
Bilirubin, Direct: 0.2 mg/dL (ref 0.0–0.3)
Total Bilirubin: 1.4 mg/dL — ABNORMAL HIGH (ref 0.3–1.2)
Total Protein: 7.6 g/dL (ref 6.0–8.3)

## 2013-09-25 LAB — LIPID PANEL
Cholesterol: 183 mg/dL (ref 0–200)
HDL: 45.1 mg/dL (ref >39.00)
LDL Cholesterol: 100 mg/dL — ABNORMAL HIGH (ref 0–99)
Total CHOL/HDL Ratio: 4
Triglycerides: 191 mg/dL — ABNORMAL HIGH (ref 0.0–149.0)
VLDL: 38.2 mg/dL (ref 0.0–40.0)

## 2013-09-25 LAB — HEMOGLOBIN A1C: Hgb A1c MFr Bld: 10 % — ABNORMAL HIGH (ref 4.6–6.5)

## 2013-09-25 LAB — TSH: TSH: 2.69 u[IU]/mL (ref 0.35–5.50)

## 2013-09-25 NOTE — Assessment & Plan Note (Signed)
Low carb diet.  Did not tolerate various diabetic medications.  Will allow her to run a little higher.  She feels good.  Follow. Check metabolic panel and U9W.

## 2013-09-25 NOTE — Assessment & Plan Note (Signed)
Off simvastatin and now off pravastatin.  Pravastatin was causing cramps.  Cramps better now.   Check lipid panel and liver function.   Follow up in 2-3 weeks for reevaluation.  If continuing to do well, will try a different cholesterol medication.

## 2013-09-25 NOTE — Assessment & Plan Note (Signed)
Currently doing well.  Has had no reoccurring problems.  Continue plavix.   

## 2013-09-25 NOTE — Assessment & Plan Note (Signed)
Blood pressure doing well.  Same medication regimen.  Check metabolic panel.  

## 2013-09-25 NOTE — Assessment & Plan Note (Signed)
Has been relatively stable.  Recheck cbc.   

## 2013-09-25 NOTE — Progress Notes (Signed)
Pre-visit discussion using our clinic review tool. No additional management support is needed unless otherwise documented below in the visit note.  

## 2013-09-25 NOTE — Progress Notes (Signed)
Subjective:    Patient ID: Angela Patterson, female    DOB: 28-Dec-1920, 80 y.o.   MRN: [id number]  HPI 80 year old female with past history of hypertension, CVA, hypercholesterolemia and diabetes who comes in today for a scheduled follow up.   She states she feels good.  Stays active.  No cardiac symptoms with increased activity or exertion. Breathing stable.  Bowels doing well.  States her sugars are doing well.  She brought in no recorded sugar readings.  Sates sugars are averaging 160 both in the am and pm.  Have tried her on diabetic medications in the past.   Does not feel as good on these medications.  Overall she feels she is doing well.  Handling stress well.  Sees Dr Wallace Going for her eye exams.  Stopped her cholesterol medication recently secondary to cramps.  Feeling better.     Past Medical History  Diagnosis Date  . Hypertension   . Hypercholesterolemia   . Diabetes mellitus   . CVA (cerebral vascular accident)   . Chicken pox   . Allergy     Current Outpatient Prescriptions on File Prior to Visit  Medication Sig Dispense Refill  . amLODipine (NORVASC) 5 MG tablet Take 1 tablet (5 mg total) by mouth 2 (two) times daily.  60 tablet  5  . aspirin 81 MG tablet Take 81 mg by mouth daily.      . Calcium Carbonate-Vitamin D (CALCIUM 600+D) 600-400 MG-UNIT per tablet Take 1 tablet by mouth 2 (two) times daily.      . Cholecalciferol (VITAMIN D3) 2000 UNITS TABS Take 1 tablet by mouth daily.      . clopidogrel (PLAVIX) 75 MG tablet TAKE ONE (1) TABLET BY MOUTH EVERY DAY  30 tablet  5  . felodipine (PLENDIL) 5 MG 24 hr tablet TAKE ONE TABLET TWICE DAILY  60 tablet  5  . fish oil-omega-3 fatty acids 1000 MG capsule Take 2 g by mouth daily.      Marland Kitchen glucose blood test strip TEST BLOOD GLUCOSE LEVELS TWICE DAILY  200 each  5  . hydrochlorothiazide (HYDRODIURIL) 25 MG tablet Take 25 mg by mouth daily.      . Multiple Vitamin (MULTIVITAMIN) tablet Take 1 tablet by mouth daily.      . timolol  (BETIMOL) 0.5 % ophthalmic solution 1 drop 2 (two) times daily.       No current facility-administered medications on file prior to visit.    Review of Systems Patient denies any headache, lightheadedness or dizziness.  No significant sinus or allergy symptoms.  No chest pain, tightness or palpitations.  No increased shortness of breath, cough or congestion.  No nausea or vomiting.  No abdominal pain or cramping.  No bowel change, such as diarrhea, constipation, BRBPR or melana.  No urine change.  Coping well with the increased stress. Overall she feels she is dong well.  Had increased cramps with changing from simvastatin to pravastatin.  Off pravastatin now and cramps are better.       Objective:   Physical Exam  Filed Vitals:   09/25/13 0834  BP: 130/70  Pulse: 68  Temp: 97.8 F (36.6 C)   Blood pressure recheck:  24/38  80 year old female in no acute distress.   HEENT:  Nares- clear.  Oropharynx - without lesions. NECK:  Supple.  Nontender.  No audible bruit.  HEART:  Appears to be regular. LUNGS:  No crackles or wheezing audible.  Respirations even  and unlabored.  RADIAL PULSE:  Equal bilaterally.     ABDOMEN:  Soft, nontender.  Bowel sounds present and normal.  No audible abdominal bruit.   EXTREMITIES:  No increased edema present.  DP pulses palpable and equal bilaterally.   FEET:  Without open lesions.          Assessment & Plan:  CARDIOVASCULAR.  Asymptomatic.  Continue risk factor modification.   INCREASED PSYCHOSOCIAL STRESSORS.  She feels she is doing well.  Handling stress well.  Desires no further intervention.  Will notify me if she feels she needs anything more.   HEALTH MAINTENANCE.  Physical 11/18/12.  She declines mammogram and further screening.

## 2013-10-19 ENCOUNTER — Encounter: Payer: Self-pay | Admitting: Internal Medicine

## 2013-10-19 ENCOUNTER — Ambulatory Visit (INDEPENDENT_AMBULATORY_CARE_PROVIDER_SITE_OTHER): Payer: Medicare Other | Admitting: Internal Medicine

## 2013-10-19 VITALS — BP 130/80 | HR 74 | Temp 97.9°F | Ht 62.25 in | Wt 108.0 lb

## 2013-10-19 DIAGNOSIS — I10 Essential (primary) hypertension: Secondary | ICD-10-CM

## 2013-10-19 DIAGNOSIS — E119 Type 2 diabetes mellitus without complications: Secondary | ICD-10-CM

## 2013-10-19 DIAGNOSIS — E7800 Pure hypercholesterolemia, unspecified: Secondary | ICD-10-CM

## 2013-10-19 MED ORDER — METFORMIN HCL 500 MG PO TABS: 500.0000 mg | ORAL_TABLET | Freq: Every day | ORAL | Status: DC

## 2013-10-19 NOTE — Progress Notes (Signed)
Pre-visit discussion using our clinic review tool. No additional management support is needed unless otherwise documented below in the visit note.  

## 2013-10-22 ENCOUNTER — Encounter: Payer: Self-pay | Admitting: Internal Medicine

## 2013-10-22 NOTE — Assessment & Plan Note (Signed)
Blood pressure doing well.  Same medication regimen.  Follow metabolic panel.   

## 2013-10-22 NOTE — Assessment & Plan Note (Signed)
Off simvastatin and now off pravastatin.  Pravastatin was causing cramps.  Cramps better now.   Remain off for now.  Once cramps have subsided, will try another cholesterol medication.

## 2013-10-22 NOTE — Progress Notes (Signed)
Subjective:    Patient ID: Angela Patterson, female    DOB: 12-Sep-1920, 80 y.o.   MRN: [id number]  HPI 80 year old female with past history of hypertension, CVA, hypercholesterolemia and diabetes who comes in today for a scheduled follow up.  Here to discuss her blood sugars.  She states she feels good.  Stays active.  No cardiac symptoms with increased activity or exertion. Breathing stable.  Bowels doing well.  Brought in sugar readings from home.  AM sugars averaging 170-220s and PM sugars averaging 170-230.  A1c recently elevated to 10.0.  Higher than it has ever been.  Trying to watch what she eats.  She is off her cholesterol medication.  Cramps are better.  Still present occasionally, but better.     Past Medical History  Diagnosis Date  . Hypertension   . Hypercholesterolemia   . Diabetes mellitus   . CVA (cerebral vascular accident)   . Chicken pox   . Allergy     Current Outpatient Prescriptions on File Prior to Visit  Medication Sig Dispense Refill  . amLODipine (NORVASC) 5 MG tablet Take 1 tablet (5 mg total) by mouth 2 (two) times daily.  60 tablet  5  . aspirin 81 MG tablet Take 81 mg by mouth daily.      . Calcium Carbonate-Vitamin D (CALCIUM 600+D) 600-400 MG-UNIT per tablet Take 1 tablet by mouth 2 (two) times daily.      . Cholecalciferol (VITAMIN D3) 2000 UNITS TABS Take 1 tablet by mouth daily.      . clopidogrel (PLAVIX) 75 MG tablet TAKE ONE (1) TABLET EACH DAY  30 tablet  5  . felodipine (PLENDIL) 5 MG 24 hr tablet TAKE ONE TABLET TWICE DAILY  60 tablet  5  . fish oil-omega-3 fatty acids 1000 MG capsule Take 2 g by mouth daily.      Marland Kitchen glucose blood test strip TEST BLOOD GLUCOSE LEVELS TWICE DAILY  200 each  5  . hydrochlorothiazide (HYDRODIURIL) 25 MG tablet Take 25 mg by mouth daily.      . Multiple Vitamin (MULTIVITAMIN) tablet Take 1 tablet by mouth daily.      . timolol (BETIMOL) 0.5 % ophthalmic solution 1 drop 2 (two) times daily.       No current  facility-administered medications on file prior to visit.    Review of Systems Patient denies any headache, lightheadedness or dizziness.  No significant sinus or allergy symptoms.  No chest pain, tightness or palpitations.  No increased shortness of breath, cough or congestion.  No nausea or vomiting.  No abdominal pain or cramping.  No bowel change, such as diarrhea, constipation, BRBPR or melana.  No urine change.   Had increased cramps with changing from simvastatin to pravastatin.  Off pravastatin now and cramps are better.  Still present some , but better.  Sugars as outlined.        Objective:   Physical Exam  Filed Vitals:   10/19/13 1453  BP: 130/80  Pulse: 74  Temp: 97.9 F (47.82 C)   80 year old female in no acute distress.  NECK:  Supple.  Nontender.  No audible bruit.  HEART:  Appears to be regular. LUNGS:  No crackles or wheezing audible.  Respirations even and unlabored.  RADIAL PULSE:  Equal bilaterally.     ABDOMEN:  Soft, nontender.  Bowel sounds present and normal.  No audible abdominal bruit.          Assessment &  Plan:  CARDIOVASCULAR.  Asymptomatic.  Continue risk factor modification.   HEALTH MAINTENANCE.  Physical 11/18/12.  She declines mammogram and further screening.

## 2013-10-22 NOTE — Assessment & Plan Note (Signed)
Sugars as outlined.  Elevated.  Last a1c 10.0.  Will start metformin 500mg  q day.  Discussed with endocrinology.  Titrate up if needed.  Get her back in soon to reassess.

## 2013-10-30 ENCOUNTER — Other Ambulatory Visit: Payer: Self-pay | Admitting: Internal Medicine

## 2013-11-09 ENCOUNTER — Other Ambulatory Visit: Payer: Self-pay | Admitting: Internal Medicine

## 2013-11-09 ENCOUNTER — Telehealth: Payer: Self-pay | Admitting: *Deleted

## 2013-11-09 DIAGNOSIS — E119 Type 2 diabetes mellitus without complications: Secondary | ICD-10-CM

## 2013-11-09 NOTE — Telephone Encounter (Signed)
Date:   AM Readings:  PM Readings:  4/16   174   277    4/17   168   183  4/18   171   192    4/19   154   231  4/20   166   176  4/21   155   228  4/22   157   228

## 2013-11-09 NOTE — Telephone Encounter (Signed)
Went on Metformin close to 3 weeks ago & has not noticed any improvements. States she doesn't feel any better. Has an appt on 5/11 for a one month f/u. Please advise.

## 2013-11-09 NOTE — Telephone Encounter (Signed)
Sugars appear to be some better.  See if she can come in the next few days to confirm kidney function ok.  This will not be a fasting lab. If ok, then I will increase her medication to bid.

## 2013-11-09 NOTE — Progress Notes (Signed)
Order placed for f/u met b 

## 2013-11-09 NOTE — Telephone Encounter (Signed)
Pt notified & scheduled lab appt for 11/14/13, also want to know if she should keep appt on 11/27/13 (1 mth f/u of sugars)

## 2013-11-09 NOTE — Telephone Encounter (Signed)
I only started her on metformin 500mg  q day.  Need to know what her am sugars and pm sugars are averaging.  May need to adjust the dose of metformin.  Also, confirm no acute symptoms.

## 2013-11-09 NOTE — Telephone Encounter (Signed)
Yes, keep appt

## 2013-11-10 NOTE — Telephone Encounter (Signed)
Mailed letter with appt reminder

## 2013-11-14 ENCOUNTER — Other Ambulatory Visit (INDEPENDENT_AMBULATORY_CARE_PROVIDER_SITE_OTHER): Payer: Medicare Other

## 2013-11-14 ENCOUNTER — Ambulatory Visit (INDEPENDENT_AMBULATORY_CARE_PROVIDER_SITE_OTHER): Payer: Medicare Other | Admitting: *Deleted

## 2013-11-14 DIAGNOSIS — E119 Type 2 diabetes mellitus without complications: Secondary | ICD-10-CM

## 2013-11-14 LAB — BASIC METABOLIC PANEL
BUN: 18 mg/dL (ref 6–23)
CO2: 26 mEq/L (ref 19–32)
Calcium: 9.9 mg/dL (ref 8.4–10.5)
Chloride: 98 mEq/L (ref 96–112)
Creatinine, Ser: 0.7 mg/dL (ref 0.4–1.2)
GFR: 87.75 mL/min (ref >60.00)
Glucose, Bld: 161 mg/dL — ABNORMAL HIGH (ref 70–99)
Potassium: 4.5 mEq/L (ref 3.5–5.1)
Sodium: 135 mEq/L (ref 135–145)

## 2013-11-14 NOTE — Progress Notes (Signed)
   Subjective:    Patient ID: Angela Patterson, female    DOB: [birth year], 80 y.o.   MRN: [id number]  HPI    Review of Systems     Objective:   Physical Exam        Assessment & Plan:  Patient and her daughter came in to shown how to use the new glucometer she just received from the pharmacy. Calibrated and set up glucometer for first time use, instructed patient to make sure the code on the bottle of strips matched the code appearing on her glucometer screen anytime she began using a new bottle of test strips. While testing her home blood sugars she need to be sure she completely fill the groove of the strip or she will receive an error message. Patient was confused about where to put the needle. Informed patient to use her lancing device to do her finger stick, demonstrated how to do that accurately. Also instructed patient to use a new lancet needle for each needle stick. Patient and daughter both seem to have a better understanding of the use of the new machine. Will call office back is she has any further instructions.

## 2013-11-15 ENCOUNTER — Encounter: Payer: Self-pay | Admitting: *Deleted

## 2013-11-21 ENCOUNTER — Telehealth: Payer: Self-pay | Admitting: *Deleted

## 2013-11-21 NOTE — Telephone Encounter (Signed)
Pharmacy calling for clarification of metformin dose.

## 2013-11-21 NOTE — Telephone Encounter (Signed)
Pharmacy Note:  Pt says directions/ dose have changed please verify and update RX

## 2013-11-21 NOTE — Telephone Encounter (Signed)
Spoke with pharmacist & pt notified them both that it was prescribed once daily. Will titrate up if needed.

## 2013-11-27 ENCOUNTER — Ambulatory Visit: Payer: Medicare Other | Admitting: Internal Medicine

## 2013-11-27 ENCOUNTER — Ambulatory Visit (INDEPENDENT_AMBULATORY_CARE_PROVIDER_SITE_OTHER): Payer: Medicare Other | Admitting: Internal Medicine

## 2013-11-27 ENCOUNTER — Encounter: Payer: Self-pay | Admitting: Internal Medicine

## 2013-11-27 VITALS — BP 120/70 | HR 83 | Temp 98.1°F | Ht 62.25 in | Wt 106.5 lb

## 2013-11-27 DIAGNOSIS — D696 Thrombocytopenia, unspecified: Secondary | ICD-10-CM

## 2013-11-27 DIAGNOSIS — E119 Type 2 diabetes mellitus without complications: Secondary | ICD-10-CM

## 2013-11-27 DIAGNOSIS — I10 Essential (primary) hypertension: Secondary | ICD-10-CM

## 2013-11-27 DIAGNOSIS — R17 Unspecified jaundice: Secondary | ICD-10-CM

## 2013-11-27 MED ORDER — METFORMIN HCL 500 MG PO TABS: 500.0000 mg | ORAL_TABLET | Freq: Two times a day (BID) | ORAL | Status: DC

## 2013-11-27 NOTE — Progress Notes (Signed)
Pre visit review using our clinic review tool, if applicable. No additional management support is needed unless otherwise documented below in the visit note. 

## 2013-11-27 NOTE — Patient Instructions (Signed)
Change metformin to 500mg  twice a day.

## 2013-11-28 ENCOUNTER — Telehealth: Payer: Self-pay | Admitting: Internal Medicine

## 2013-11-28 ENCOUNTER — Encounter: Payer: Self-pay | Admitting: Internal Medicine

## 2013-11-28 NOTE — Assessment & Plan Note (Signed)
Sugars as outlined.  Elevated.  Last a1c 10.0.  On metformin 500mg  q day.  AM sugars have improved.  Will increase metformin to 500mg  bid.  Follow sugars.   Get her back in soon to reassess.

## 2013-11-28 NOTE — Telephone Encounter (Signed)
Please call and notify pt that she needs to come in for labs within the next week.  Non fasting lab.

## 2013-11-28 NOTE — Telephone Encounter (Signed)
5/19 lab appointment pt aware

## 2013-11-28 NOTE — Assessment & Plan Note (Signed)
Blood pressure doing well.  Same medication regimen.  Follow metabolic panel.   

## 2013-11-28 NOTE — Progress Notes (Signed)
Subjective:    Patient ID: Angela Patterson, female    DOB: 11/13/1920, 80 y.o.   MRN: [id number]  HPI 80 year old female with past history of hypertension, CVA, hypercholesterolemia and diabetes who comes in today for a scheduled follow up.  Here to discuss her blood sugars.  She states she feels good.  Stays active.  No cardiac symptoms with increased activity or exertion. Breathing stable.  Bowels doing well.  Brought in sugar readings from home.  AM sugars averaging 130-170 and PM sugars averaging 200s. A1c recently elevated to 10.0.  Higher than it has ever been.  Trying to watch what she eats.  Daughter accompanies her today.  States that she previously was not eating - feeling that this would lower her sugars.  She has started eating more regular now.  No nausea or vomiting.  No abdominal pain or cramping.  Taking one metformin daily now.      Past Medical History  Diagnosis Date  . Hypertension   . Hypercholesterolemia   . Diabetes mellitus   . CVA (cerebral vascular accident)   . Chicken pox   . Allergy     Current Outpatient Prescriptions on File Prior to Visit  Medication Sig Dispense Refill  . amLODipine (NORVASC) 5 MG tablet Take 1 tablet (5 mg total) by mouth 2 (two) times daily.  60 tablet  5  . aspirin 81 MG tablet Take 81 mg by mouth daily.      . Calcium Carbonate-Vitamin D (CALCIUM 600+D) 600-400 MG-UNIT per tablet Take 1 tablet by mouth 2 (two) times daily.      . Cholecalciferol (VITAMIN D3) 2000 UNITS TABS Take 1 tablet by mouth daily.      . clopidogrel (PLAVIX) 75 MG tablet TAKE ONE (1) TABLET EACH DAY  30 tablet  5  . felodipine (PLENDIL) 5 MG 24 hr tablet TAKE ONE TABLET TWICE DAILY  60 tablet  5  . fish oil-omega-3 fatty acids 1000 MG capsule Take 2 g by mouth daily.      Marland Kitchen glucose blood test strip TEST BLOOD GLUCOSE LEVELS TWICE DAILY  200 each  5  . hydrochlorothiazide (HYDRODIURIL) 25 MG tablet Take 25 mg by mouth daily.      [employer] Devices (ONE TOUCH DELICA  LANCING DEV) MISC Use twice daily Dx: 250.00  100 each  5  . Multiple Vitamin (MULTIVITAMIN) tablet Take 1 tablet by mouth daily.      . timolol (BETIMOL) 0.5 % ophthalmic solution 1 drop 2 (two) times daily.       No current facility-administered medications on file prior to visit.    Review of Systems Patient denies any headache, lightheadedness or dizziness.  No significant sinus or allergy symptoms.  No chest pain, tightness or palpitations.  No increased shortness of breath, cough or congestion.  No nausea or vomiting.  No abdominal pain or cramping.  No bowel change, such as diarrhea, constipation, BRBPR or melana.  No urine change.   Had increased cramps with changing from simvastatin to pravastatin.   Sugars as outlined.        Objective:   Physical Exam  Filed Vitals:   11/27/13 0907  BP: 120/70  Pulse: 83  Temp: 98.1 F (40.1 C)   80 year old female in no acute distress.   HEENT:  Nares- clear.  Oropharynx - without lesions. NECK:  Supple.  Nontender.  No audible bruit.  HEART:  Appears to be regular. LUNGS:  No  crackles or wheezing audible.  Respirations even and unlabored.  RADIAL PULSE:  Equal bilaterally. ABDOMEN:  Soft, nontender.  Bowel sounds present and normal.  No audible abdominal bruit.   EXTREMITIES:  No increased edema present.  DP pulses palpable and equal bilaterally.      FEET:  No lesions.        Assessment & Plan:  CARDIOVASCULAR.  Asymptomatic.  Continue risk factor modification.   HEALTH MAINTENANCE.  Physical 11/18/12.  She declines mammogram and further screening.

## 2013-12-05 ENCOUNTER — Other Ambulatory Visit (INDEPENDENT_AMBULATORY_CARE_PROVIDER_SITE_OTHER): Payer: Medicare Other

## 2013-12-05 ENCOUNTER — Encounter: Payer: Self-pay | Admitting: Internal Medicine

## 2013-12-05 DIAGNOSIS — R17 Unspecified jaundice: Secondary | ICD-10-CM

## 2013-12-05 DIAGNOSIS — D696 Thrombocytopenia, unspecified: Secondary | ICD-10-CM

## 2013-12-05 DIAGNOSIS — I10 Essential (primary) hypertension: Secondary | ICD-10-CM

## 2013-12-05 DIAGNOSIS — E119 Type 2 diabetes mellitus without complications: Secondary | ICD-10-CM

## 2013-12-05 LAB — CBC WITH DIFFERENTIAL/PLATELET
Basophils Absolute: 0 10*3/uL (ref 0.0–0.1)
Basophils Relative: 0.5 % (ref 0.0–3.0)
Eosinophils Absolute: 0.1 10*3/uL (ref 0.0–0.7)
Eosinophils Relative: 1.4 % (ref 0.0–5.0)
HCT: 45.7 % (ref 36.0–46.0)
Hemoglobin: 15.6 g/dL — ABNORMAL HIGH (ref 12.0–15.0)
Lymphocytes Relative: 24.8 % (ref 12.0–46.0)
Lymphs Abs: 1.8 10*3/uL (ref 0.7–4.0)
MCHC: 34.1 g/dL (ref 30.0–36.0)
MCV: 94.7 fl (ref 78.0–100.0)
Monocytes Absolute: 0.6 10*3/uL (ref 0.1–1.0)
Monocytes Relative: 8.2 % (ref 3.0–12.0)
Neutro Abs: 4.7 10*3/uL (ref 1.4–7.7)
Neutrophils Relative %: 65.1 % (ref 43.0–77.0)
Platelets: 144 10*3/uL — ABNORMAL LOW (ref 150.0–400.0)
RBC: 4.82 Mil/uL (ref 3.87–5.11)
RDW: 13.1 % (ref 11.5–15.5)
WBC: 7.3 10*3/uL (ref 4.0–10.5)

## 2013-12-05 LAB — HEPATIC FUNCTION PANEL
ALT: 16 U/L (ref 0–35)
AST: 23 U/L (ref 0–37)
Albumin: 3.8 g/dL (ref 3.5–5.2)
Alkaline Phosphatase: 56 U/L (ref 39–117)
Bilirubin, Direct: 0.2 mg/dL (ref 0.0–0.3)
Total Bilirubin: 0.8 mg/dL (ref 0.2–1.2)
Total Protein: 6.9 g/dL (ref 6.0–8.3)

## 2013-12-05 LAB — BASIC METABOLIC PANEL
BUN: 18 mg/dL (ref 6–23)
CO2: 28 mEq/L (ref 19–32)
Calcium: 9.4 mg/dL (ref 8.4–10.5)
Chloride: 105 mEq/L (ref 96–112)
Creatinine, Ser: 0.7 mg/dL (ref 0.4–1.2)
GFR: 78.23 mL/min (ref >60.00)
Glucose, Bld: 141 mg/dL — ABNORMAL HIGH (ref 70–99)
Potassium: 4.2 mEq/L (ref 3.5–5.1)
Sodium: 141 mEq/L (ref 135–145)

## 2013-12-27 ENCOUNTER — Ambulatory Visit (INDEPENDENT_AMBULATORY_CARE_PROVIDER_SITE_OTHER): Payer: Medicare Other | Admitting: Internal Medicine

## 2013-12-27 ENCOUNTER — Encounter: Payer: Self-pay | Admitting: Internal Medicine

## 2013-12-27 VITALS — BP 110/60 | HR 67 | Temp 98.0°F | Ht 62.25 in | Wt 105.2 lb

## 2013-12-27 DIAGNOSIS — E119 Type 2 diabetes mellitus without complications: Secondary | ICD-10-CM

## 2013-12-27 DIAGNOSIS — E7800 Pure hypercholesterolemia, unspecified: Secondary | ICD-10-CM

## 2013-12-27 DIAGNOSIS — I6350 Cerebral infarction due to unspecified occlusion or stenosis of unspecified cerebral artery: Secondary | ICD-10-CM

## 2013-12-27 DIAGNOSIS — I639 Cerebral infarction, unspecified: Secondary | ICD-10-CM

## 2013-12-27 DIAGNOSIS — R634 Abnormal weight loss: Secondary | ICD-10-CM

## 2013-12-27 DIAGNOSIS — I10 Essential (primary) hypertension: Secondary | ICD-10-CM

## 2013-12-27 DIAGNOSIS — D696 Thrombocytopenia, unspecified: Secondary | ICD-10-CM

## 2013-12-27 NOTE — Assessment & Plan Note (Signed)
Off simvastatin and now off pravastatin.  Pravastatin was causing cramps.  Cramps better now.   Remain off for now.  Once cramps have subsided, will try another cholesterol medication.  Check cholesterol with next labs.

## 2013-12-27 NOTE — Assessment & Plan Note (Signed)
Blood pressure has been doing well.  Same medication regimen.  Follow metabolic panel.

## 2013-12-27 NOTE — Assessment & Plan Note (Signed)
Has been relatively stable.  Recheck cbc.

## 2013-12-27 NOTE — Assessment & Plan Note (Signed)
Currently doing well.  Has had no reoccurring problems.  Continue plavix.

## 2013-12-27 NOTE — Assessment & Plan Note (Signed)
Sugars as outlined.  Improved.  She is eating better.  Adjusted her diet.  Feels good.  Stays active.  Continue on metformin.  Renal function ok.  Check met b and a1c with next fasting labs.

## 2013-12-27 NOTE — Assessment & Plan Note (Signed)
She initially was not eating.  Now eating better and has adjusted her diet.  Will follow her weight.

## 2013-12-27 NOTE — Progress Notes (Signed)
Subjective:    Patient ID: Angela Patterson, female    DOB: 06-06-1921, 80 y.o.   MRN: [id number]  HPI 80 year old female with past history of hypertension, CVA, hypercholesterolemia and diabetes who comes in today for a scheduled follow up.  Here to discuss her blood sugars.  She states she feels good.  Stays active.  No cardiac symptoms with increased activity or exertion. Breathing stable.  Bowels doing well.  Brought in sugar readings from home.  AM sugars averaging 120-130s and PM sugars averaging 130s-160s.   Trying to watch what she eats.  She is eating regular meals and snacks.  Eating better.  Has lost weight.  Appears to be leveling off.  Daughter accompanies her today.  States that she previously was not eating - feeling that this would lower her sugars.  She has started eating more regular now.  No nausea or vomiting.  No abdominal pain or cramping.  Overall feels food and doing better.     Past Medical History  Diagnosis Date  . Hypertension   . Hypercholesterolemia   . Diabetes mellitus   . CVA (cerebral vascular accident)   . Chicken pox   . Allergy     Current Outpatient Prescriptions on File Prior to Visit  Medication Sig Dispense Refill  . amLODipine (NORVASC) 5 MG tablet Take 1 tablet (5 mg total) by mouth 2 (two) times daily.  60 tablet  5  . aspirin 81 MG tablet Take 81 mg by mouth daily.      . Calcium Carbonate-Vitamin D (CALCIUM 600+D) 600-400 MG-UNIT per tablet Take 1 tablet by mouth 2 (two) times daily.      . Cholecalciferol (VITAMIN D3) 2000 UNITS TABS Take 1 tablet by mouth daily.      . clopidogrel (PLAVIX) 75 MG tablet TAKE ONE (1) TABLET EACH DAY  30 tablet  5  . felodipine (PLENDIL) 5 MG 24 hr tablet TAKE ONE TABLET TWICE DAILY  60 tablet  5  . fish oil-omega-3 fatty acids 1000 MG capsule Take 2 g by mouth daily.      Marland Kitchen glucose blood test strip TEST BLOOD GLUCOSE LEVELS TWICE DAILY  200 each  5  . hydrochlorothiazide (HYDRODIURIL) 25 MG tablet Take 25 mg by  mouth daily.      [employer] Devices (ONE TOUCH DELICA LANCING DEV) MISC Use twice daily Dx: 250.00  100 each  5  . metFORMIN (GLUCOPHAGE) 500 MG tablet Take 1 tablet (500 mg total) by mouth 2 (two) times daily with a meal.  60 tablet  2  . Multiple Vitamin (MULTIVITAMIN) tablet Take 1 tablet by mouth daily.      . timolol (BETIMOL) 0.5 % ophthalmic solution 1 drop 2 (two) times daily.       No current facility-administered medications on file prior to visit.    Review of Systems Patient denies any headache, lightheadedness or dizziness.  No significant sinus or allergy symptoms.  No chest pain, tightness or palpitations.  No increased shortness of breath, cough or congestion.  No nausea or vomiting.  No abdominal pain or cramping.  No bowel change, such as diarrhea, constipation, BRBPR or melana.  No urine change.  Sugars as outlined.   Eating better.  Stays active.  Feels good.       Objective:   Physical Exam  Filed Vitals:   12/27/13 0954  BP: 110/60  Pulse: 67  Temp: 98 F (36.7 C)   Blood pressure recheck:  41/40  80 year old female in no acute distress.   HEENT:  Nares- clear.  Oropharynx - without lesions. NECK:  Supple.  Nontender.    HEART:  Appears to be regular. LUNGS:  No crackles or wheezing audible.  Respirations even and unlabored.  RADIAL PULSE:  Equal bilaterally. ABDOMEN:  Soft, nontender.  Bowel sounds present and normal.  No audible abdominal bruit.   EXTREMITIES:  No increased edema present.  DP pulses palpable and equal bilaterally.      FEET:  No lesions.        Assessment & Plan:  CARDIOVASCULAR.  Asymptomatic.  Continue risk factor modification.   HEALTH MAINTENANCE.  Physical 11/18/12.  She declines mammogram and further screening.

## 2014-01-22 ENCOUNTER — Other Ambulatory Visit: Payer: Self-pay | Admitting: Internal Medicine

## 2014-02-16 ENCOUNTER — Other Ambulatory Visit (INDEPENDENT_AMBULATORY_CARE_PROVIDER_SITE_OTHER): Payer: Medicare Other

## 2014-02-16 DIAGNOSIS — E7800 Pure hypercholesterolemia, unspecified: Secondary | ICD-10-CM

## 2014-02-16 DIAGNOSIS — D696 Thrombocytopenia, unspecified: Secondary | ICD-10-CM

## 2014-02-16 DIAGNOSIS — E119 Type 2 diabetes mellitus without complications: Secondary | ICD-10-CM

## 2014-02-16 LAB — LIPID PANEL
Cholesterol: 166 mg/dL (ref 0–200)
HDL: 44.1 mg/dL (ref >39.00)
LDL Cholesterol: 96 mg/dL (ref 0–99)
NonHDL: 121.9
Total CHOL/HDL Ratio: 4
Triglycerides: 128 mg/dL (ref 0.0–149.0)
VLDL: 25.6 mg/dL (ref 0.0–40.0)

## 2014-02-16 LAB — CBC WITH DIFFERENTIAL/PLATELET
Basophils Absolute: 0 10*3/uL (ref 0.0–0.1)
Basophils Relative: 0.4 % (ref 0.0–3.0)
Eosinophils Absolute: 0.1 10*3/uL (ref 0.0–0.7)
Eosinophils Relative: 2.4 % (ref 0.0–5.0)
HCT: 45.9 % (ref 36.0–46.0)
Hemoglobin: 15.4 g/dL — ABNORMAL HIGH (ref 12.0–15.0)
Lymphocytes Relative: 26.7 % (ref 12.0–46.0)
Lymphs Abs: 1.6 10*3/uL (ref 0.7–4.0)
MCHC: 33.6 g/dL (ref 30.0–36.0)
MCV: 94.4 fl (ref 78.0–100.0)
Monocytes Absolute: 0.5 10*3/uL (ref 0.1–1.0)
Monocytes Relative: 8.6 % (ref 3.0–12.0)
Neutro Abs: 3.7 10*3/uL (ref 1.4–7.7)
Neutrophils Relative %: 61.9 % (ref 43.0–77.0)
Platelets: 159 10*3/uL (ref 150.0–400.0)
RBC: 4.87 Mil/uL (ref 3.87–5.11)
RDW: 13 % (ref 11.5–15.5)
WBC: 6 10*3/uL (ref 4.0–10.5)

## 2014-02-16 LAB — BASIC METABOLIC PANEL
BUN: 17 mg/dL (ref 6–23)
CO2: 32 mEq/L (ref 19–32)
Calcium: 9.4 mg/dL (ref 8.4–10.5)
Chloride: 104 mEq/L (ref 96–112)
Creatinine, Ser: 0.6 mg/dL (ref 0.4–1.2)
GFR: 105.68 mL/min (ref >60.00)
Glucose, Bld: 142 mg/dL — ABNORMAL HIGH (ref 70–99)
Potassium: 4.5 mEq/L (ref 3.5–5.1)
Sodium: 140 mEq/L (ref 135–145)

## 2014-02-16 LAB — HEPATIC FUNCTION PANEL
ALT: 14 U/L (ref 0–35)
AST: 20 U/L (ref 0–37)
Albumin: 3.9 g/dL (ref 3.5–5.2)
Alkaline Phosphatase: 48 U/L (ref 39–117)
Bilirubin, Direct: 0.2 mg/dL (ref 0.0–0.3)
Total Bilirubin: 1 mg/dL (ref 0.2–1.2)
Total Protein: 7.5 g/dL (ref 6.0–8.3)

## 2014-02-16 LAB — HEMOGLOBIN A1C: Hgb A1c MFr Bld: 7.3 % — ABNORMAL HIGH (ref 4.6–6.5)

## 2014-02-17 ENCOUNTER — Encounter: Payer: Self-pay | Admitting: Internal Medicine

## 2014-02-23 ENCOUNTER — Encounter: Payer: Self-pay | Admitting: Internal Medicine

## 2014-02-23 ENCOUNTER — Ambulatory Visit (INDEPENDENT_AMBULATORY_CARE_PROVIDER_SITE_OTHER): Payer: Medicare Other | Admitting: Internal Medicine

## 2014-02-23 ENCOUNTER — Other Ambulatory Visit: Payer: Self-pay | Admitting: Internal Medicine

## 2014-02-23 VITALS — BP 148/78 | HR 80 | Temp 97.9°F | Resp 16 | Ht 62.25 in | Wt 106.5 lb

## 2014-02-23 DIAGNOSIS — I6350 Cerebral infarction due to unspecified occlusion or stenosis of unspecified cerebral artery: Secondary | ICD-10-CM

## 2014-02-23 DIAGNOSIS — E119 Type 2 diabetes mellitus without complications: Secondary | ICD-10-CM

## 2014-02-23 DIAGNOSIS — I639 Cerebral infarction, unspecified: Secondary | ICD-10-CM

## 2014-02-23 DIAGNOSIS — D696 Thrombocytopenia, unspecified: Secondary | ICD-10-CM

## 2014-02-23 DIAGNOSIS — I10 Essential (primary) hypertension: Secondary | ICD-10-CM

## 2014-02-23 DIAGNOSIS — R634 Abnormal weight loss: Secondary | ICD-10-CM

## 2014-02-23 DIAGNOSIS — E7800 Pure hypercholesterolemia, unspecified: Secondary | ICD-10-CM

## 2014-02-23 NOTE — Progress Notes (Signed)
Pre-visit discussion using our clinic review tool. No additional management support is needed unless otherwise documented below in the visit note.  

## 2014-02-25 ENCOUNTER — Encounter: Payer: Self-pay | Admitting: Internal Medicine

## 2014-02-25 NOTE — Assessment & Plan Note (Addendum)
Off simvastatin and now off pravastatin.  Pravastatin was causing cramps.  Cramps better now.   Remain off for now.  Follow cholesterol.

## 2014-02-25 NOTE — Assessment & Plan Note (Signed)
Stable now.  Up one pound from last check.  Follow.

## 2014-02-25 NOTE — Assessment & Plan Note (Signed)
Has been relatively stable.  Last check platelet count wnl.

## 2014-02-25 NOTE — Assessment & Plan Note (Signed)
Blood pressure has been doing well.  Same medication regimen.  Follow metabolic panel.

## 2014-02-25 NOTE — Progress Notes (Signed)
Subjective:    Patient ID: Angela Patterson, female    DOB: 18-Mar-1921, 80 y.o.   MRN: [id number]  HPI 80 year old female with past history of hypertension, CVA, hypercholesterolemia and diabetes who comes in today for a scheduled follow up.   She states she feels good.  Stays active.  No cardiac symptoms with increased activity or exertion. Breathing stable.  Bowels doing well.  Brought in sugar readings from home.  AM sugars averaging 120-130s and PM sugars averaging 160-200.   Trying to watch what she eats.  She is eating regular meals and snacks.  Eating better.  Had lost weight.  Has leveled off now.  Increased one pound now.   Daughter accompanies her today.  States that she previously was not eating - feeling that this would lower her sugars.  She has started eating more regular now.  No nausea or vomiting.  No abdominal pain or cramping.  Overall feels food and doing better.     Past Medical History  Diagnosis Date  . Hypertension   . Hypercholesterolemia   . Diabetes mellitus   . CVA (cerebral vascular accident)   . Chicken pox   . Allergy     Current Outpatient Prescriptions on File Prior to Visit  Medication Sig Dispense Refill  . aspirin 81 MG tablet Take 81 mg by mouth daily.      . Calcium Carbonate-Vitamin D (CALCIUM 600+D) 600-400 MG-UNIT per tablet Take 1 tablet by mouth 2 (two) times daily.      . Cholecalciferol (VITAMIN D3) 2000 UNITS TABS Take 1 tablet by mouth daily.      . clopidogrel (PLAVIX) 75 MG tablet TAKE ONE (1) TABLET EACH DAY  30 tablet  5  . felodipine (PLENDIL) 5 MG 24 hr tablet TAKE ONE TABLET TWICE DAILY  60 tablet  5  . fish oil-omega-3 fatty acids 1000 MG capsule Take 2 g by mouth daily.      Marland Kitchen glucose blood test strip TEST BLOOD GLUCOSE LEVELS TWICE DAILY  200 each  5  . hydrochlorothiazide (HYDRODIURIL) 25 MG tablet Take 25 mg by mouth daily.      [employer] Devices (ONE TOUCH DELICA LANCING DEV) MISC Use twice daily Dx: 250.00  100 each  5  . metFORMIN  (GLUCOPHAGE) 500 MG tablet TAKE ONE TABLET BY MOUTH TWICE DAILY WITH MEALS  60 tablet  2  . Multiple Vitamin (MULTIVITAMIN) tablet Take 1 tablet by mouth daily.      . timolol (BETIMOL) 0.5 % ophthalmic solution 1 drop 2 (two) times daily.       No current facility-administered medications on file prior to visit.    Review of Systems Patient denies any headache, lightheadedness or dizziness.  No significant sinus or allergy symptoms.  No chest pain, tightness or palpitations.  No increased shortness of breath, cough or congestion.  No nausea or vomiting.  No abdominal pain or cramping.  No bowel change, such as diarrhea, constipation, BRBPR or melana.  No urine change.  Sugars as outlined.   Eating better.  Stays active.  Feels good.       Objective:   Physical Exam  Filed Vitals:   02/23/14 1505  BP: 148/78  Pulse: 80  Temp: 97.9 F (36.6 C)  Resp: 16   Blood pressure recheck:  40/80  80 year old female in no acute distress.   HEENT:  Nares- clear.  Oropharynx - without lesions. NECK:  Supple.  Nontender.  HEART:  Appears to be regular. LUNGS:  No crackles or wheezing audible.  Respirations even and unlabored.  RADIAL PULSE:  Equal bilaterally. ABDOMEN:  Soft, nontender.  Bowel sounds present and normal.  No audible abdominal bruit.   EXTREMITIES:  No increased edema present.  DP pulses palpable and equal bilaterally.      FEET:  No lesions.        Assessment & Plan:  CARDIOVASCULAR.  Asymptomatic.  Continue risk factor modification.   HEALTH MAINTENANCE.  Physical 11/18/12.  She declines mammogram and further screening.

## 2014-02-25 NOTE — Assessment & Plan Note (Signed)
Currently doing well.  Has had no reoccurring problems.  Continue plavix.

## 2014-02-25 NOTE — Assessment & Plan Note (Signed)
Sugars as outlined.  Improved.  She is eating better.  Adjusted her diet.  Feels good.  Stays active.  Continue on metformin.  Renal function ok.  Check met b and a1c with next fasting labs.     

## 2014-03-27 ENCOUNTER — Other Ambulatory Visit: Payer: Self-pay | Admitting: Internal Medicine

## 2014-05-02 ENCOUNTER — Other Ambulatory Visit: Payer: Self-pay | Admitting: Internal Medicine

## 2014-06-18 ENCOUNTER — Other Ambulatory Visit: Payer: Self-pay | Admitting: *Deleted

## 2014-06-18 MED ORDER — GLUCOSE BLOOD VI STRP: ORAL_STRIP | Status: DC

## 2014-06-25 ENCOUNTER — Other Ambulatory Visit: Payer: Self-pay | Admitting: Internal Medicine

## 2014-07-06 ENCOUNTER — Other Ambulatory Visit (INDEPENDENT_AMBULATORY_CARE_PROVIDER_SITE_OTHER): Payer: Medicare Other

## 2014-07-06 DIAGNOSIS — E119 Type 2 diabetes mellitus without complications: Secondary | ICD-10-CM

## 2014-07-06 DIAGNOSIS — E7800 Pure hypercholesterolemia, unspecified: Secondary | ICD-10-CM

## 2014-07-06 LAB — HEPATIC FUNCTION PANEL
ALT: 15 U/L (ref 0–35)
AST: 19 U/L (ref 0–37)
Albumin: 4 g/dL (ref 3.5–5.2)
Alkaline Phosphatase: 51 U/L (ref 39–117)
Bilirubin, Direct: 0.1 mg/dL (ref 0.0–0.3)
Total Bilirubin: 1 mg/dL (ref 0.2–1.2)
Total Protein: 7.7 g/dL (ref 6.0–8.3)

## 2014-07-06 LAB — LIPID PANEL
Cholesterol: 175 mg/dL (ref 0–200)
HDL: 36.2 mg/dL — ABNORMAL LOW (ref >39.00)
LDL Cholesterol: 115 mg/dL — ABNORMAL HIGH (ref 0–99)
NonHDL: 138.8
Total CHOL/HDL Ratio: 5
Triglycerides: 121 mg/dL (ref 0.0–149.0)
VLDL: 24.2 mg/dL (ref 0.0–40.0)

## 2014-07-06 LAB — HEMOGLOBIN A1C: Hgb A1c MFr Bld: 6.8 % — ABNORMAL HIGH (ref 4.6–6.5)

## 2014-07-06 LAB — BASIC METABOLIC PANEL
BUN: 14 mg/dL (ref 6–23)
CO2: 27 mEq/L (ref 19–32)
Calcium: 9.6 mg/dL (ref 8.4–10.5)
Chloride: 104 mEq/L (ref 96–112)
Creatinine, Ser: 0.6 mg/dL (ref 0.4–1.2)
GFR: 101.47 mL/min (ref >60.00)
Glucose, Bld: 140 mg/dL — ABNORMAL HIGH (ref 70–99)
Potassium: 4.2 mEq/L (ref 3.5–5.1)
Sodium: 141 mEq/L (ref 135–145)

## 2014-07-08 ENCOUNTER — Encounter: Payer: Self-pay | Admitting: Internal Medicine

## 2014-07-09 ENCOUNTER — Ambulatory Visit (INDEPENDENT_AMBULATORY_CARE_PROVIDER_SITE_OTHER): Payer: Medicare Other | Admitting: Internal Medicine

## 2014-07-09 ENCOUNTER — Encounter: Payer: Self-pay | Admitting: Internal Medicine

## 2014-07-09 VITALS — BP 130/80 | HR 73 | Temp 97.5°F | Ht 63.25 in | Wt 105.5 lb

## 2014-07-09 DIAGNOSIS — I639 Cerebral infarction, unspecified: Secondary | ICD-10-CM

## 2014-07-09 DIAGNOSIS — D696 Thrombocytopenia, unspecified: Secondary | ICD-10-CM

## 2014-07-09 DIAGNOSIS — E7800 Pure hypercholesterolemia, unspecified: Secondary | ICD-10-CM

## 2014-07-09 DIAGNOSIS — R634 Abnormal weight loss: Secondary | ICD-10-CM

## 2014-07-09 DIAGNOSIS — E119 Type 2 diabetes mellitus without complications: Secondary | ICD-10-CM

## 2014-07-09 DIAGNOSIS — I10 Essential (primary) hypertension: Secondary | ICD-10-CM

## 2014-07-09 DIAGNOSIS — Z23 Encounter for immunization: Secondary | ICD-10-CM

## 2014-07-09 LAB — HM DIABETES FOOT EXAM

## 2014-07-09 NOTE — Progress Notes (Signed)
Pre visit review using our clinic review tool, if applicable. No additional management support is needed unless otherwise documented below in the visit note. 

## 2014-07-09 NOTE — Progress Notes (Signed)
Subjective:    Patient ID: Angela Patterson, female    DOB: [birth year], 80 y.o.   MRN: [id number]  HPI 80 year old female with past history of hypertension, CVA, hypercholesterolemia and diabetes who comes in today to follow up on these issues as well as for a complete physical exam.   She states she feels good.  Stays active.  No cardiac symptoms with increased activity or exertion. Breathing stable.  Bowels doing well.  Brought in sugar readings from home.  AM sugars averaging 120-130s and PM sugars averaging 160-200.   Trying to watch what she eats.  She is eating regular meals and snacks.  Eating better.  Had lost weight.  Has leveled off now.  Decreased one pound from last check.   Daughter accompanies her today.  No nausea or vomiting.  No abdominal pain or cramping.  Overall feels food and doing better.  Has persistent left foot lesion.  Seeing Dr Elvina Mattes.  Lesion better.     Past Medical History  Diagnosis Date  . Hypertension   . Hypercholesterolemia   . Diabetes mellitus   . CVA (cerebral vascular accident)   . Chicken pox   . Allergy     Current Outpatient Prescriptions on File Prior to Visit  Medication Sig Dispense Refill  . amLODipine (NORVASC) 5 MG tablet TAKE ONE TABLET TWICE DAILY 60 tablet 6  . aspirin 81 MG tablet Take 81 mg by mouth daily.    . Calcium Carbonate-Vitamin D (CALCIUM 600+D) 600-400 MG-UNIT per tablet Take 1 tablet by mouth 2 (two) times daily.    . Cholecalciferol (VITAMIN D3) 2000 UNITS TABS Take 1 tablet by mouth daily.    . clopidogrel (PLAVIX) 75 MG tablet TAKE ONE (1) TABLET BY MOUTH EVERY DAY 30 tablet 6  . felodipine (PLENDIL) 5 MG 24 hr tablet TAKE ONE TABLET TWICE DAILY 60 tablet 5  . fish oil-omega-3 fatty acids 1000 MG capsule Take 2 g by mouth daily.    Marland Kitchen glucose blood test strip TEST BLOOD GLUCOSE LEVELS TWICE DAILY. One Touch. Dx E11.9 100 each 5  . hydrochlorothiazide (HYDRODIURIL) 25 MG tablet Take 25 mg by mouth daily.    [employer] Devices  (ONE TOUCH DELICA LANCING DEV) MISC Use twice daily Dx: 250.00 100 each 5  . metFORMIN (GLUCOPHAGE) 500 MG tablet TAKE ONE TABLET BY MOUTH TWICE DAILY WITH FOOD AS DIRECTED 60 tablet 6  . Multiple Vitamin (MULTIVITAMIN) tablet Take 1 tablet by mouth daily.    . timolol (BETIMOL) 0.5 % ophthalmic solution 1 drop 2 (two) times daily.     No current facility-administered medications on file prior to visit.    Review of Systems Patient denies any headache, lightheadedness or dizziness.  No significant sinus or allergy symptoms.  No chest pain, tightness or palpitations.  No increased shortness of breath, cough or congestion.  No nausea or vomiting.  No abdominal pain or cramping.  No bowel change, such as diarrhea, constipation, BRBPR or melana.  No urine change.  Sugars as outlined.   Eating better.  Stays active.  Feels good.  She did notice sugars increased.  Stopped mello yellows.       Objective:   Physical Exam  Filed Vitals:   07/09/14 0943  BP: 130/80  Pulse: 73  Temp: 97.5 F (45.54 C)   80 year old female in no acute distress.   HEENT:  Nares- clear.  Oropharynx - without lesions. NECK:  Supple.  Nontender.  No audible bruit.  HEART:  Appears to be regular. LUNGS:  No crackles or wheezing audible.  Respirations even and unlabored.  RADIAL PULSE:  Equal bilaterally.    BREASTS:  No nipple discharge or nipple retraction present.  Could not appreciate any distinct nodules or axillary adenopathy.  ABDOMEN:  Soft, nontender.  Bowel sounds present and normal.  No audible abdominal bruit.  GU:  Not performed.     EXTREMITIES:  No increased edema present.  DP pulses palpable and equal bilaterally.    FEET:  Healing lesion.         Assessment & Plan:  1. Essential hypertension Blood pressure doing well.  Same medication regimen.  Follow.    2. CVA (cerebral vascular accident) No reoccurring symptoms.  On plavix.  Follow.    3. Type 2 diabetes mellitus without complication Low  carb diet.  Eat regular meals.   - Basic metabolic panel; Future - Hemoglobin A1c; Future - Microalbumin / creatinine urine ratio; Future Lab Results  Component Value Date   HGBA1C 6.8* 07/06/2014   4. Thrombocytopenia Platelet count 02/16/14 - wnl - 159.    5. Hypercholesterolemia Low cholesterol diet and exercise.   - Lipid panel; Future - Hepatic function panel; Future Lab Results  Component Value Date   CHOL 175 07/06/2014   HDL 36.20* 07/06/2014   LDLCALC 115* 07/06/2014   LDLDIRECT 66.4 05/24/2013   TRIG 121.0 07/06/2014   CHOLHDL 5 07/06/2014   6. Weight loss Overall stable.  Slightly decreased from the last check, but overall stable.  Follow.   - TSH; Future  7. Need for prophylactic vaccination against Streptococcus pneumoniae (pneumococcus) - Pneumococcal conjugate vaccine 13-valent  8. CARDIOVASCULAR.  Asymptomatic.  Continue risk factor modification.   HEALTH MAINTENANCE.  Physical today.  She declines mammogram and further screening.

## 2014-07-15 ENCOUNTER — Encounter: Payer: Self-pay | Admitting: Internal Medicine

## 2014-09-21 ENCOUNTER — Other Ambulatory Visit: Payer: Self-pay | Admitting: Internal Medicine

## 2014-09-24 ENCOUNTER — Encounter: Payer: Self-pay | Admitting: Surgery

## 2014-09-24 DIAGNOSIS — L97512 Non-pressure chronic ulcer of other part of right foot with fat layer exposed: Secondary | ICD-10-CM | POA: Diagnosis not present

## 2014-09-24 DIAGNOSIS — E11621 Type 2 diabetes mellitus with foot ulcer: Secondary | ICD-10-CM | POA: Diagnosis not present

## 2014-09-24 DIAGNOSIS — L97522 Non-pressure chronic ulcer of other part of left foot with fat layer exposed: Secondary | ICD-10-CM | POA: Diagnosis not present

## 2014-09-25 DIAGNOSIS — L97509 Non-pressure chronic ulcer of other part of unspecified foot with unspecified severity: Secondary | ICD-10-CM | POA: Diagnosis not present

## 2014-09-26 ENCOUNTER — Ambulatory Visit: Payer: Self-pay | Admitting: Surgery

## 2014-09-26 DIAGNOSIS — M7989 Other specified soft tissue disorders: Secondary | ICD-10-CM | POA: Diagnosis not present

## 2014-09-26 DIAGNOSIS — S91302A Unspecified open wound, left foot, initial encounter: Secondary | ICD-10-CM | POA: Diagnosis not present

## 2014-09-26 DIAGNOSIS — M85872 Other specified disorders of bone density and structure, left ankle and foot: Secondary | ICD-10-CM | POA: Diagnosis not present

## 2014-10-04 DIAGNOSIS — L97522 Non-pressure chronic ulcer of other part of left foot with fat layer exposed: Secondary | ICD-10-CM | POA: Diagnosis not present

## 2014-10-04 DIAGNOSIS — L97512 Non-pressure chronic ulcer of other part of right foot with fat layer exposed: Secondary | ICD-10-CM | POA: Diagnosis not present

## 2014-10-04 DIAGNOSIS — E11621 Type 2 diabetes mellitus with foot ulcer: Secondary | ICD-10-CM | POA: Diagnosis not present

## 2014-10-09 ENCOUNTER — Ambulatory Visit: Payer: Self-pay | Admitting: Surgery

## 2014-10-09 DIAGNOSIS — L97529 Non-pressure chronic ulcer of other part of left foot with unspecified severity: Secondary | ICD-10-CM | POA: Diagnosis not present

## 2014-10-09 DIAGNOSIS — S91302A Unspecified open wound, left foot, initial encounter: Secondary | ICD-10-CM | POA: Diagnosis not present

## 2014-10-11 DIAGNOSIS — L97512 Non-pressure chronic ulcer of other part of right foot with fat layer exposed: Secondary | ICD-10-CM | POA: Diagnosis not present

## 2014-10-11 DIAGNOSIS — L97522 Non-pressure chronic ulcer of other part of left foot with fat layer exposed: Secondary | ICD-10-CM | POA: Diagnosis not present

## 2014-10-11 DIAGNOSIS — E11621 Type 2 diabetes mellitus with foot ulcer: Secondary | ICD-10-CM | POA: Diagnosis not present

## 2014-10-19 ENCOUNTER — Other Ambulatory Visit (INDEPENDENT_AMBULATORY_CARE_PROVIDER_SITE_OTHER): Payer: Medicare Other

## 2014-10-19 DIAGNOSIS — E7800 Pure hypercholesterolemia, unspecified: Secondary | ICD-10-CM

## 2014-10-19 DIAGNOSIS — R634 Abnormal weight loss: Secondary | ICD-10-CM

## 2014-10-19 DIAGNOSIS — E119 Type 2 diabetes mellitus without complications: Secondary | ICD-10-CM | POA: Diagnosis not present

## 2014-10-19 LAB — HEPATIC FUNCTION PANEL
ALT: 11 U/L (ref 0–35)
AST: 18 U/L (ref 0–37)
Albumin: 4.1 g/dL (ref 3.5–5.2)
Alkaline Phosphatase: 53 U/L (ref 39–117)
Bilirubin, Direct: 0.2 mg/dL (ref 0.0–0.3)
Total Bilirubin: 1 mg/dL (ref 0.2–1.2)
Total Protein: 7.3 g/dL (ref 6.0–8.3)

## 2014-10-19 LAB — LIPID PANEL
Cholesterol: 163 mg/dL (ref 0–200)
HDL: 48.7 mg/dL (ref >39.00)
LDL Cholesterol: 90 mg/dL (ref 0–99)
NonHDL: 114.3
Total CHOL/HDL Ratio: 3
Triglycerides: 122 mg/dL (ref 0.0–149.0)
VLDL: 24.4 mg/dL (ref 0.0–40.0)

## 2014-10-19 LAB — BASIC METABOLIC PANEL
BUN: 12 mg/dL (ref 6–23)
CO2: 32 mEq/L (ref 19–32)
Calcium: 9.5 mg/dL (ref 8.4–10.5)
Chloride: 104 mEq/L (ref 96–112)
Creatinine, Ser: 0.61 mg/dL (ref 0.40–1.20)
GFR: 97.58 mL/min (ref >60.00)
Glucose, Bld: 136 mg/dL — ABNORMAL HIGH (ref 70–99)
Potassium: 4.1 mEq/L (ref 3.5–5.1)
Sodium: 140 mEq/L (ref 135–145)

## 2014-10-19 LAB — HEMOGLOBIN A1C: Hgb A1c MFr Bld: 6.8 % — ABNORMAL HIGH (ref 4.6–6.5)

## 2014-10-19 LAB — TSH: TSH: 4.11 u[IU]/mL (ref 0.35–4.50)

## 2014-10-19 LAB — MICROALBUMIN / CREATININE URINE RATIO
Creatinine,U: 60.6 mg/dL
Microalb Creat Ratio: 2.3 mg/g (ref 0.0–30.0)
Microalb, Ur: 1.4 mg/dL (ref 0.0–1.9)

## 2014-10-20 ENCOUNTER — Encounter: Payer: Self-pay | Admitting: Internal Medicine

## 2014-10-23 ENCOUNTER — Ambulatory Visit (INDEPENDENT_AMBULATORY_CARE_PROVIDER_SITE_OTHER): Payer: Medicare Other | Admitting: Internal Medicine

## 2014-10-23 ENCOUNTER — Encounter: Payer: Self-pay | Admitting: Internal Medicine

## 2014-10-23 VITALS — BP 120/70 | HR 85 | Temp 97.7°F | Ht 63.25 in | Wt 105.4 lb

## 2014-10-23 DIAGNOSIS — E7800 Pure hypercholesterolemia, unspecified: Secondary | ICD-10-CM

## 2014-10-23 DIAGNOSIS — D696 Thrombocytopenia, unspecified: Secondary | ICD-10-CM

## 2014-10-23 DIAGNOSIS — Z0000 Encounter for general adult medical examination without abnormal findings: Secondary | ICD-10-CM

## 2014-10-23 DIAGNOSIS — E119 Type 2 diabetes mellitus without complications: Secondary | ICD-10-CM | POA: Diagnosis not present

## 2014-10-23 DIAGNOSIS — I10 Essential (primary) hypertension: Secondary | ICD-10-CM | POA: Diagnosis not present

## 2014-10-23 DIAGNOSIS — I639 Cerebral infarction, unspecified: Secondary | ICD-10-CM

## 2014-10-23 DIAGNOSIS — R634 Abnormal weight loss: Secondary | ICD-10-CM

## 2014-10-23 MED ORDER — METFORMIN HCL 500 MG PO TABS: ORAL_TABLET | ORAL | Status: DC

## 2014-10-23 MED ORDER — AMLODIPINE BESYLATE 5 MG PO TABS: ORAL_TABLET | ORAL | Status: DC

## 2014-10-23 MED ORDER — CLOPIDOGREL BISULFATE 75 MG PO TABS: ORAL_TABLET | ORAL | Status: DC

## 2014-10-23 NOTE — Telephone Encounter (Signed)
Unread mychart message mailed to patient 

## 2014-10-23 NOTE — Progress Notes (Signed)
Pre visit review using our clinic review tool, if applicable. No additional management support is needed unless otherwise documented below in the visit note. 

## 2014-10-23 NOTE — Progress Notes (Signed)
Patient ID: Angela Patterson, female   DOB: 11/07/1920, 81 y.o.   MRN: [id number]   Subjective:    Patient ID: Angela Patterson, female    DOB: 1921/04/13, 81 y.o.   MRN: [id number]  HPI  Patient here for a scheduled follow up.  State she is doing well.  Stays active.  No cardiac symptoms with increased activity or exertion.  Breathing stable.  Eating and drinking well.  Weight stable.  Feels good.  Sugars doing well.     Past Medical History  Diagnosis Date  . Hypertension   . Hypercholesterolemia   . Diabetes mellitus   . CVA (cerebral vascular accident)   . Chicken pox   . Allergy     Current Outpatient Prescriptions on File Prior to Visit  Medication Sig Dispense Refill  . aspirin 81 MG tablet Take 81 mg by mouth daily.    . Calcium Carbonate-Vitamin D (CALCIUM 600+D) 600-400 MG-UNIT per tablet Take 1 tablet by mouth 2 (two) times daily.    . Cholecalciferol (VITAMIN D3) 2000 UNITS TABS Take 1 tablet by mouth daily.    . felodipine (PLENDIL) 5 MG 24 hr tablet TAKE ONE TABLET TWICE DAILY 60 tablet 5  . fish oil-omega-3 fatty acids 1000 MG capsule Take 2 g by mouth daily.    Marland Kitchen glucose blood test strip TEST BLOOD GLUCOSE LEVELS TWICE DAILY. One Touch. Dx E11.9 100 each 5  . hydrochlorothiazide (HYDRODIURIL) 25 MG tablet Take 25 mg by mouth daily.    [employer] (ONE TOUCH DELICA LANCING DEV) MISC Use twice daily Dx: 250.00 100 each 5  . Multiple Vitamin (MULTIVITAMIN) tablet Take 1 tablet by mouth daily.    . timolol (BETIMOL) 0.5 % ophthalmic solution 1 drop 2 (two) times daily.     No current facility-administered medications on file prior to visit.    Review of Systems  Constitutional: Negative for appetite change and unexpected weight change.  HENT: Negative for congestion and sinus pressure.   Respiratory: Negative for cough, chest tightness and shortness of breath.   Cardiovascular: Negative for chest pain, palpitations and leg swelling.  Gastrointestinal: Negative for  nausea, vomiting, abdominal pain and diarrhea.  Neurological: Negative for dizziness, light-headedness and headaches.       Objective:    Physical Exam  HENT:  Nose: Nose normal.  Mouth/Throat: Oropharynx is clear and moist.  Neck: Neck supple. No thyromegaly present.  Cardiovascular: Normal rate and regular rhythm.   Pulmonary/Chest: Breath sounds normal. No respiratory distress. She has no wheezes.  Abdominal: Soft. Bowel sounds are normal. There is no tenderness.  Musculoskeletal: She exhibits no edema or tenderness.  Lymphadenopathy:    She has no cervical adenopathy.    BP 120/70 mmHg  Pulse 85  Temp(Src) 97.7 F (36.5 C) (Oral)  Ht 5' 3.25" (1.607 m)  Wt 105 lb 6 oz (47.798 kg)  BMI 18.51 kg/m2  SpO2 97%  LMP 07/21/1966 Wt Readings from Last 3 Encounters:  10/23/14 105 lb 6 oz (47.798 kg)  07/09/14 105 lb 8 oz (47.854 kg)  02/23/14 106 lb 8 oz (48.308 kg)     Lab Results  Component Value Date   WBC 6.0 02/16/2014   HGB 15.4* 02/16/2014   HCT 45.9 02/16/2014   PLT 159.0 02/16/2014   GLUCOSE 136* 10/19/2014   CHOL 163 10/19/2014   TRIG 122.0 10/19/2014   HDL 48.70 10/19/2014   LDLDIRECT 66.4 05/24/2013   LDLCALC 90 10/19/2014   ALT 11  10/19/2014   AST 18 10/19/2014   NA 140 10/19/2014   K 4.1 10/19/2014   CL 104 10/19/2014   CREATININE 0.61 10/19/2014   BUN 12 10/19/2014   CO2 32 10/19/2014   TSH 4.11 10/19/2014   HGBA1C 6.8* 10/19/2014   MICROALBUR 1.4 10/19/2014       Assessment & Plan:   Problem List Items Addressed This Visit    CVA (cerebral vascular accident) - Primary    Currently doing well.  On plavix.  No recurring symptoms.  Follow.       Relevant Medications   amLODIpine (NORVASC) tablet   Diabetes mellitus    A1c just checked - 6.8.  Continue same medication regimen.  Follow sugars.        Relevant Medications   metFORMIN (GLUCOPHAGE) tablet   Other Relevant Orders   Hemoglobin A1c   Health care maintenance    Physical  07/09/14.  Declines mammogram and colon evaluation.       Hypercholesterolemia    Cholesterol doing well.  LDL 90.  Follow.       Relevant Medications   amLODIpine (NORVASC) tablet   Other Relevant Orders   Lipid panel   Hepatic function panel   Hypertension    Blood pressure doing well.  Same medication regimen.  Follow met b.  Follow pressures.        Relevant Medications   amLODIpine (NORVASC) tablet   Other Relevant Orders   Basic metabolic panel   Thrombocytopenia    Recheck cbc with next labs.       Relevant Orders   CBC with Differential/Platelet   Weight loss    Weight stable.  Eating and drinking well.  Follow.            Einar Pheasant, MD

## 2014-10-27 ENCOUNTER — Encounter: Payer: Self-pay | Admitting: Internal Medicine

## 2014-10-27 DIAGNOSIS — Z0000 Encounter for general adult medical examination without abnormal findings: Secondary | ICD-10-CM | POA: Insufficient documentation

## 2014-10-27 NOTE — Assessment & Plan Note (Signed)
Weight stable.  Eating and drinking well.  Follow.

## 2014-10-27 NOTE — Assessment & Plan Note (Signed)
A1c just checked - 6.8.  Continue same medication regimen.  Follow sugars.

## 2014-10-27 NOTE — Assessment & Plan Note (Signed)
Currently doing well.  On plavix.  No recurring symptoms.  Follow.

## 2014-10-27 NOTE — Assessment & Plan Note (Signed)
Physical 07/09/14.  Declines mammogram and colon evaluation.

## 2014-10-27 NOTE — Assessment & Plan Note (Signed)
Recheck cbc with next labs.   

## 2014-10-27 NOTE — Assessment & Plan Note (Signed)
Blood pressure doing well.  Same medication regimen.  Follow met b.  Follow pressures.

## 2014-10-27 NOTE — Assessment & Plan Note (Signed)
Cholesterol doing well.  LDL 90.  Follow.

## 2014-11-19 DIAGNOSIS — H4011X2 Primary open-angle glaucoma, moderate stage: Secondary | ICD-10-CM | POA: Diagnosis not present

## 2014-11-28 ENCOUNTER — Other Ambulatory Visit: Payer: Self-pay | Admitting: Internal Medicine

## 2014-12-03 ENCOUNTER — Telehealth: Payer: Self-pay

## 2014-12-03 DIAGNOSIS — S91309A Unspecified open wound, unspecified foot, initial encounter: Secondary | ICD-10-CM

## 2014-12-03 NOTE — Telephone Encounter (Signed)
The patient's son called and is hoping to get the patient a referral to a wound care specialist.  He states he has a sore on her foot the size of a silver dollar.   Wound Care - Dr.Britto (in [place])

## 2014-12-03 NOTE — Telephone Encounter (Signed)
I have placed the order for the referral.  Do they feel she needs to be seen here first for evaluation - to see if need something more before going to wound center.

## 2014-12-04 ENCOUNTER — Encounter: Payer: Self-pay | Admitting: *Deleted

## 2014-12-04 ENCOUNTER — Encounter: Payer: Medicare Other | Attending: Surgery | Admitting: Surgery

## 2014-12-04 DIAGNOSIS — L97522 Non-pressure chronic ulcer of other part of left foot with fat layer exposed: Secondary | ICD-10-CM | POA: Diagnosis not present

## 2014-12-04 DIAGNOSIS — E11621 Type 2 diabetes mellitus with foot ulcer: Secondary | ICD-10-CM | POA: Insufficient documentation

## 2014-12-04 DIAGNOSIS — Z8673 Personal history of transient ischemic attack (TIA), and cerebral infarction without residual deficits: Secondary | ICD-10-CM | POA: Diagnosis not present

## 2014-12-04 DIAGNOSIS — L84 Corns and callosities: Secondary | ICD-10-CM | POA: Diagnosis not present

## 2014-12-04 DIAGNOSIS — L97521 Non-pressure chronic ulcer of other part of left foot limited to breakdown of skin: Secondary | ICD-10-CM | POA: Diagnosis not present

## 2014-12-04 NOTE — Telephone Encounter (Signed)
Sent mychart message

## 2014-12-06 DIAGNOSIS — H4011X2 Primary open-angle glaucoma, moderate stage: Secondary | ICD-10-CM | POA: Diagnosis not present

## 2014-12-06 LAB — HM DIABETES EYE EXAM

## 2014-12-06 NOTE — Progress Notes (Signed)
VERBIE, BABIC ([id number]) Visit Report for 12/04/2014 Allergy List Details Patient Name: Angela Patterson, Angela Patterson. Date of Service: 12/04/2014 1:00 PM Medical Record Number: [id number] Patient Account Number: [id number] Date of Birth/Sex: 21-Mar-1921 (81 y.o. Female) Treating RN: Afful, RN, BSN, Velva Harman Primary Care Physician: Einar Pheasant Other Clinician: Referring Physician: Einar Pheasant Treating Physician/Extender: Frann Rider in Treatment: 0 Allergies Active Allergies Iodinated Contrast Media - IV Dye Allergy Notes Electronic Signature(s) Signed: 12/05/2014 5:13:49 PM By: Regan Lemming BSN, RN Entered By: Regan Lemming on 12/04/2014 13:27:42 Angela Patterson ([id number]) -------------------------------------------------------------------------------- Arrival Information Details Patient Name: Angela Patterson. Date of Service: 12/04/2014 1:00 PM Medical Record Number: [id number] Patient Account Number: [id number] Date of Birth/Sex: Apr 18, 1921 (81 y.o. Female) Treating RN: Afful, RN, BSN, Velva Harman Primary Care Physician: Einar Pheasant Other Clinician: Referring Physician: Einar Pheasant Treating Physician/Extender: Frann Rider in Treatment: 0 Visit Information Patient Arrived: Ambulatory Arrival Time: 13:19 Accompanied By: son Transfer Assistance: None Patient Identification Verified: Yes Secondary Verification Process Yes Completed: Patient Requires Transmission- No Based Precautions: Patient Has Alerts: Yes Patient Alerts: 09/24/14 ABI L:1.05, R:0.99 DM II History Since Last Visit Any new allergies or adverse reactions: No Had a fall or experienced change in activities of daily living that may affect risk of falls: No Signs or symptoms of abuse/neglect since last visito No Hospitalized since last visit: No Electronic Signature(s) Signed: 12/05/2014 5:13:49 PM By: Regan Lemming BSN, RN Entered By: Regan Lemming on 12/04/2014 14:27:21 Angela Patterson  ([id number]) -------------------------------------------------------------------------------- Clinic Level of Care Assessment Details Patient Name: Angela Patterson. Date of Service: 12/04/2014 1:00 PM Medical Record Number: [id number] Patient Account Number: [id number] Date of Birth/Sex: 05-29-1921 (81 y.o. Female) Treating RN: Junious Dresser Primary Care Physician: Einar Pheasant Other Clinician: Referring Physician: Einar Pheasant Treating Physician/Extender: Frann Rider in Treatment: 0 Clinic Level of Care Assessment Items TOOL 1 Quantity Score []  - Use when EandM and Procedure is performed on INITIAL visit 0 ASSESSMENTS - Nursing Assessment / Reassessment []  - General Physical Exam (combine w/ comprehensive assessment (listed just 0 below) when performed on new pt. evals) X - Comprehensive Assessment (HX, ROS, Risk Assessments, Wounds Hx, etc.) 1 25 ASSESSMENTS - Wound and Skin Assessment / Reassessment []  - Dermatologic / Skin Assessment (not related to wound area) 0 ASSESSMENTS - Ostomy and/or Continence Assessment and Care []  - Incontinence Assessment and Management 0 []  - Ostomy Care Assessment and Management (repouching, etc.) 0 PROCESS - Coordination of Care X - Simple Patient / Family Education for ongoing care 1 15 []  - Complex (extensive) Patient / Family Education for ongoing care 0 X - Staff obtains Programmer, systems, Records, Test Results / Process Orders 1 10 []  - Staff telephones HHA, Nursing Homes / Clarify orders / etc 0 []  - Routine Transfer to another Facility (non-emergent condition) 0 []  - Routine Hospital Admission (non-emergent condition) 0 X - New Admissions / Biomedical engineer / Ordering NPWT, Apligraf, etc. 1 15 []  - Emergency Hospital Admission (emergent condition) 0 PROCESS - Special Needs []  - Pediatric / Minor Patient Management 0 []  - Isolation Patient Management 0 AALIYANA, FREDERICKS ([id number]) []  - Hearing / Language / Visual special needs  0 []  - Assessment of Community assistance (transportation, D/C planning, etc.) 0 []  - Additional assistance / Altered mentation 0 []  - Support Surface(s) Assessment (bed, cushion, seat, etc.) 0 INTERVENTIONS - Miscellaneous []  - External ear exam 0 []  - Patient Transfer (multiple staff / Civil Service fast streamer / Similar devices) 0 []  -  Simple Staple / Suture removal (25 or less) 0 []  - Complex Staple / Suture removal (26 or more) 0 []  - Hypo/Hyperglycemic Management (do not check if billed separately) 0 X - Ankle / Brachial Index (ABI) - do not check if billed separately 1 15 Has the patient been seen at the hospital within the last three years: Yes Total Score: 80 Level Of Care: New/Established - Level 3 Electronic Signature(s) Signed: 12/04/2014 4:40:32 PM By: Junious Dresser RN Entered By: Junious Dresser on 12/04/2014 15:05:40 Angela Patterson ([id number]) -------------------------------------------------------------------------------- Encounter Discharge Information Details Patient Name: Angela Patterson. Date of Service: 12/04/2014 1:00 PM Medical Record Number: [id number] Patient Account Number: [id number] Date of Birth/Sex: 04-19-1921 (81 y.o. Female) Treating RN: Primary Care Physician: Einar Pheasant Other Clinician: Referring Physician: Einar Pheasant Treating Physician/Extender: Frann Rider in Treatment: 0 Encounter Discharge Information Items Schedule Follow-up Appointment: No Medication Reconciliation completed No and provided to Patient/Care Novah Goza: Provided on Clinical Summary of Care: 12/04/2014 Form Type Recipient Paper Patient EG Electronic Signature(s) Signed: 12/04/2014 11:18:10 AM By: Ruthine Dose Entered By: Ruthine Dose on 12/04/2014 14:18:10 Angela Patterson ([id number]) -------------------------------------------------------------------------------- Lower Extremity Assessment Details Patient Name: Angela Patterson. Date of Service: 12/04/2014 1:00 PM Medical  Record Number: [id number] Patient Account Number: [id number] Date of Birth/Sex: [birth year] (81 y.o. Female) Treating RN: Afful, RN, BSN, Velva Harman Primary Care Physician: Einar Pheasant Other Clinician: Referring Physician: Einar Pheasant Treating Physician/Extender: Frann Rider in Treatment: 0 Vascular Assessment Claudication: Claudication Assessment [Left:None] Pulses: Posterior Tibial Palpable: [Left:Yes] Dorsalis Pedis Palpable: [Left:Yes] Extremity colors, hair growth, and conditions: Extremity Color: [Left:Normal] Hair Growth on Extremity: [Left:No] Temperature of Extremity: [Left:Warm] Capillary Refill: [Left:< 3 seconds] Dependent Rubor: [Left:No] Blanched when Elevated: [Left:No] Toe Nail Assessment Left: Right: Thick: Yes Discolored: Yes Deformed: No Improper Length and Hygiene: No Electronic Signature(s) Signed: 12/05/2014 5:13:49 PM By: Regan Lemming BSN, RN Entered By: Regan Lemming on 12/04/2014 13:31:48 Angela Patterson ([id number]) -------------------------------------------------------------------------------- Multi Wound Chart Details Patient Name: Angela Patterson. Date of Service: 12/04/2014 1:00 PM Medical Record Number: [id number] Patient Account Number: [id number] Date of Birth/Sex: 10/20/1920 (81 y.o. 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] Comorbid History: [2:Cataracts, Glaucoma, Hypertension, Type II Diabetes, History of  pressure wounds, Neuropathy] [N/A:N/A] Date Acquired: [2:11/13/2014] [N/A:N/A] Weeks of Treatment: [2:0] [N/A:N/A] Wound Status: [2:Open] [N/A:N/A] Measurements L x W x D 2.5x4x0.1 [N/A:N/A] (cm) Area (cm) : [2:7.854] [N/A:N/A] Volume (cm) : [2:0.785] [N/A:N/A] % Reduction in Area: [2:0.00%] [N/A:N/A] % Reduction in Volume: 0.00% [N/A:N/A] Classification: [2:Unable to visualize wound N/A bed] Exudate Amount: [2:Small] [N/A:N/A] Exudate Type: [2:Serosanguineous] [N/A:N/A] Exudate Color: [2:red, brown] [N/A:N/A] Foul Odor After [2:Yes] [N/A:N/A] Cleansing: Odor Anticipated Due to No [N/A:N/A] Product Use: Wound Margin: [2:Indistinct, nonvisible] [N/A:N/A] Granulation Amount: [2:None Present (0%)] [N/A:N/A] Necrotic Amount: Large (67-100%) N/A N/A Necrotic Tissue: Eschar, Adherent Slough N/A N/A Exposed Structures: Fascia: No N/A N/A Fat: No Tendon: No Muscle: No Joint: No Bone: No Limited to Skin Breakdown Epithelialization: None N/A N/A Periwound Skin Texture: Callus: Yes N/A N/A Edema: No Excoriation: No Induration: No Crepitus: No Fluctuance: No Friable: No Rash: No Scarring: No Periwound  Skin Moist: Yes N/A N/A Moisture: Maceration: No Dry/Scaly: No Periwound Skin Color: Atrophie Blanche: No N/A N/A Cyanosis: No Ecchymosis: No Erythema: No Hemosiderin Staining: No Mottled: No Pallor: No Rubor: No Temperature: No Abnormality N/A N/A Tenderness on No N/A N/A Palpation: Wound Preparation: Ulcer Cleansing: N/A N/A Rinsed/Irrigated with Saline Topical Anesthetic Applied: Other: Lidocaine 4% Ointment Treatment Notes Electronic Signature(s) Signed: 12/04/2014 4:40:32 PM By: Junious Dresser RN Entered By: Junious Dresser on 12/04/2014 13:50:15 Angela Patterson ([id number]) -------------------------------------------------------------------------------- [place] Details Patient Name: Angela Patterson. Date of Service: 12/04/2014 1:00  PM Medical Record Number: [id number] Patient Account Number: [id number] Date of Birth/Sex: [birth year] (81 y.o. Female) Treating RN: Junious Dresser Primary Care Physician: Einar Pheasant Other Clinician: Referring Physician: Einar Pheasant Treating Physician/Extender: Frann Rider in Treatment: 0 Active Inactive Orientation to the Wound Care Program Nursing Diagnoses: Knowledge deficit related to the wound healing center program Goals: Patient/caregiver will verbalize understanding of the [facility] Program Date Initiated: 12/04/2014 Goal Status: Active Interventions: Provide education on orientation to the wound center Notes: Wound/Skin Impairment Nursing Diagnoses: Impaired tissue integrity Goals: Patient/caregiver will verbalize understanding of skin care regimen Date Initiated: 12/04/2014 Goal Status: Active Ulcer/skin breakdown will heal within 14 weeks Date Initiated: 12/04/2014 Goal Status: Active Interventions: Assess patient/caregiver ability to obtain necessary supplies Assess patient/caregiver ability to perform ulcer/skin care regimen upon admission and as needed Provide education on ulcer and skin care Treatment Activities: Skin care regimen initiated : 12/04/2014 Topical wound management initiated : 12/04/2014 Angela Patterson ([id number]) Notes: Electronic Signature(s) Signed: 12/04/2014 4:40:32 PM By: Junious Dresser RN Entered By: Junious Dresser on 12/04/2014 13:49:15 Angela Patterson ([id number]) -------------------------------------------------------------------------------- Pain Assessment Details Patient Name: Angela Patterson. Date of Service: 12/04/2014 1:00 PM Medical Record Number: [id number] Patient Account Number: [id number] Date of Birth/Sex: [birth year] (81 y.o. Female) Treating RN: Baruch Gouty, RN, BSN, Velva Harman Primary Care Physician: Einar Pheasant Other Clinician: Referring Physician: Einar Pheasant Treating Physician/Extender: Frann Rider in Treatment: 0 Active Problems Location of Pain Severity and Description of Pain Patient Has Paino No Site Locations Pain Management and Medication Current Pain Management: Electronic Signature(s) Signed: 12/05/2014 5:13:49 PM By: Regan Lemming BSN, RN Entered By: Regan Lemming on 12/04/2014 13:20:41 Angela Patterson ([id number]) -------------------------------------------------------------------------------- Wound Assessment Details Patient Name: Angela Patterson. Date of Service: 12/04/2014 1:00 PM Medical Record Number: [id number] Patient Account Number: [id number] Date of Birth/Sex: 09-07-1920 (81 y.o. Female) Treating RN: Afful, RN, BSN, Velva Harman Primary Care Physician: Einar Pheasant Other Clinician: Referring Physician: Einar Pheasant Treating Physician/Extender: Frann Rider in Treatment: 0 Wound Status Wound Number: 2 Primary Diabetic Wound/Ulcer of the Lower Etiology: Extremity Wound Location: Left Foot - Plantar Wound Open Wounding Event: Gradually Appeared Status: Date Acquired: 11/13/2014 Comorbid Cataracts, Glaucoma, Hypertension, Weeks Of Treatment: 0 History: Type II Diabetes, History of pressure Clustered Wound: No wounds, Neuropathy Photos Photo Uploaded By: Regan Lemming on 12/04/2014 14:32:59 Wound Measurements Length: (cm) 2.5 Width: (cm) 4 Depth: (cm) 0.1 Area: (cm) 7.854 Volume: (cm) 0.785 % Reduction in Area: 0% % Reduction in Volume: 0% Epithelialization: None Tunneling: No Wound Description Classification: Unable to visualize wound bed Wound Margin: Indistinct, nonvisible Exudate Amount: Small Exudate Type: Serosanguineous Exudate Color: red, brown Foul Odor After Cleansing: Yes Due to Product Use: No Wound Bed Granulation Amount: None Present (0%) Exposed Structure Necrotic Amount: Large (67-100%) Fascia Exposed: No Necrotic Quality: Eschar, Adherent Slough Fat Layer Exposed: No Tendon Exposed: No Angela Patterson  ([id number]) Muscle Exposed: No  Joint Exposed: No Bone Exposed: No Limited to Skin Breakdown Periwound Skin Texture Texture Color No Abnormalities Noted: No No Abnormalities Noted: No Callus: Yes Atrophie Blanche: No Crepitus: No Cyanosis: No Excoriation: No Ecchymosis: No Fluctuance: No Erythema: No Friable: No Hemosiderin Staining: No Induration: No Mottled: No Localized Edema: No Pallor: No Rash: No Rubor: No Scarring: No Temperature / Pain Moisture Temperature: No Abnormality No Abnormalities Noted: No Dry / Scaly: No Maceration: No Moist: Yes Wound Preparation Ulcer Cleansing: Rinsed/Irrigated with Saline Topical Anesthetic Applied: Other: Lidocaine 4% Ointment, Electronic Signature(s) Signed: 12/05/2014 5:13:49 PM By: Regan Lemming BSN, RN Entered By: Regan Lemming on 12/04/2014 13:34:23 Angela Patterson ([id number]) -------------------------------------------------------------------------------- Vitals Details Patient Name: Angela Patterson. Date of Service: 12/04/2014 1:00 PM Medical Record Number: [id number] Patient Account Number: [id number] Date of Birth/Sex: 13-Apr-1921 (81 y.o. Female) Treating RN: Afful, RN, BSN, Velva Harman Primary Care Physician: Einar Pheasant Other Clinician: Referring Physician: Einar Pheasant Treating Physician/Extender: Frann Rider in Treatment: 0 Vital Signs Time Taken: 13:20 Temperature (F): 97.7 Height (in): 65 Pulse (bpm): 67 Source: Stated Respiratory Rate (breaths/min): 16 Weight (lbs): 105 Blood Pressure (mmHg): 162/66 Source: Measured Reference Range: 80 - 120 mg / dl Body Mass Index (BMI): 17.5 Electronic Signature(s) Signed: 12/05/2014 5:13:49 PM By: Regan Lemming BSN, RN Entered By: Regan Lemming on 12/04/2014 13:39:18

## 2014-12-06 NOTE — Progress Notes (Signed)
Angela Patterson, Angela Patterson ([id number]) Visit Report for 12/04/2014 Chief Complaint Document Details Patient Name: Angela Patterson, Angela Patterson. Date of Service: 12/04/2014 1:00 PM Medical Record Number: [id number] Patient Account Number: [id number] Date of Birth/Sex: 12/17/1920 (81 y.o. Female) Treating RN: Primary Care Physician: Einar Pheasant Other Clinician: Referring Physician: Einar Pheasant Treating Physician/Extender: Frann Rider in Treatment: 0 Information Obtained from: Patient Chief Complaint Patient presents to the wound care center for a consult due non healing wound Pleasant 81 year old comes along with her son with a recurrent ulceration and callosity on her left foot. she has had this for about 2 weeks now. Electronic Signature(s) Signed: 12/04/2014 2:50:30 PM By: Christin Fudge MD, FACS Entered By: Christin Fudge on 12/04/2014 14:09:29 Angela Patterson ([id number]) -------------------------------------------------------------------------------- Debridement Details Patient Name: Angela Patterson. Date of Service: 12/04/2014 1:00 PM Medical Record Number: [id number] Patient Account Number: [id number] Date of Birth/Sex: 23-Feb-1921 (81 y.o. Female) Treating RN: Primary Care Physician: Einar Pheasant Other Clinician: Referring Physician: Einar Pheasant Treating Physician/Extender: Frann Rider in Treatment: 0 Debridement Performed for Wound #2 Left,Plantar Foot Assessment: Performed By: Physician Pat Patrick., MD Debridement: Open Wound/Selective Debridement Selective Description: Pre-procedure Yes Verification/Time Out Taken: Start Time: 13:51 Pain Control: Lidocaine 4% Topical Solution Level: Non-Viable Tissue Total Area Debrided (L x 2.5 (cm) x 4 (cm) = 10 (cm) W): Tissue and other Viable, Non-Viable, Callus, Eschar, Fibrin/Slough, Skin, Subcutaneous material debrided: Instrument: Forceps, Scissors Bleeding: Minimum Hemostasis Achieved: Pressure End Time:  14:00 Procedural Pain: 0 Post Procedural Pain: 0 Response to Treatment: Procedure was tolerated well Post Debridement Measurements of Total Wound Length: (cm) 1 Width: (cm) 0.6 Depth: (cm) 0.3 Volume: (cm) 0.141 Electronic Signature(s) Signed: 12/04/2014 2:50:30 PM By: Christin Fudge MD, FACS Entered By: Christin Fudge on 12/04/2014 14:08:17 Angela Patterson ([id number]) -------------------------------------------------------------------------------- HPI Details Patient Name: Angela Patterson. Date of Service: 12/04/2014 1:00 PM Medical Record Number: [id number] Patient Account Number: [id number] Date of Birth/Sex: 27-May-1921 (81 y.o. Female) Treating RN: Primary Care Physician: Einar Pheasant Other Clinician: Referring Physician: Einar Pheasant Treating Physician/Extender: Frann Rider in Treatment: 0 History of Present Illness Location: left foot Quality: mild pain occasionally. Severity: the problem is been getting worse for the last 1 months or so. Duration: she's had this for almost a year now. Timing: pain is intermittent and occasional. Context: she was seen by me earlier in March and was healed by 10/14/2014. Associated Signs and Symptoms: worse on standing on her feet for too long. HPI Description: this very pleasant 81 year old patient who is extremely active and ambulating all day has had problems with the left foot for over a year. Her daughter who is her caregiver says she drives around and even goes around and does her chores by herself. she saw her PCP for this who has been taking care of this by trimming the callus and has also requested some special diabetic shoes. She has been on the shoes for about 6 months now. her diabetes is under control and her blood sugars run from a anywhere between 110 to 130 and she checks them twice a day. no fever or discharge. 10/04/2014 she has been doing very well and wearing her darko offloading shoe and her daughter has  been doing the dressing appropriately. She has no fresh complaints. An x-ray of her left foot was done on 09/26/2014. There was no cortical erosion noted there was mild osteopenia in the distal first metatarsal. Radiologist has recommended a MRI of the left foot to exclude osteomyelitis. 10/11/2014 --  MRI done on 10/09/2014 reveals that the area where she had a phlegmon is without osteomyelitis. the bone marrow and other areas are compatible with reactive edema rather than osteomyelitis. She was seen by me in March 2016, after 3 visits we had healed out her left forefoot where she had a ulceration with condoms and callosities. She had done very well and was supposed to be fitted with diabetic shoes, and proper insoles but for some reason this did not get done.her last hemoglobin A1c done in April 2016 was 6.8. Electronic Signature(s) Signed: 12/04/2014 2:50:30 PM By: Christin Fudge MD, FACS Entered By: Christin Fudge on 12/04/2014 14:31:40 Angela Patterson ([id number]) -------------------------------------------------------------------------------- Physical Exam Details Patient Name: Angela Patterson. Date of Service: 12/04/2014 1:00 PM Medical Record Number: [id number] Patient Account Number: [id number] Date of Birth/Sex: [birth year] (81 y.o. Female) Treating RN: Primary Care Physician: Einar Pheasant Other Clinician: Referring Physician: Einar Pheasant Treating Physician/Extender: Frann Rider in Treatment: 0 Constitutional . Pulse regular. Respirations normal and unlabored. Afebrile. . Eyes Nonicteric. Reactive to light. Ears, Nose, Mouth, and Throat Lips, teeth, and gums WNL.Marland Kitchen Moist mucosa without lesions . Neck supple and nontender. No palpable supraclavicular or cervical adenopathy. Normal sized without goiter. Respiratory WNL. No retractions.. Cardiovascular Pedal Pulses WNL. No clubbing, cyanosis or edema. Gastrointestinal (GI) Abdomen without masses or tenderness.. No  liver or spleen enlargement or tenderness.. Musculoskeletal Adexa without tenderness or enlargement.. Digits and nails w/o clubbing, cyanosis, infection, petechiae, ischemia, or inflammatory conditions.. Integumentary (Hair, Skin) she has a large callosity on the plantar aspect of the left foot near the head of the first metatarsal. It is foul- smelling and has some moisture under it.. No crepitus or fluctuance. No peri-wound warmth or erythema. No masses.Marland Kitchen Psychiatric Judgement and insight Intact.. No evidence of depression, anxiety, or agitation.. Electronic Signature(s) Signed: 12/04/2014 2:50:30 PM By: Christin Fudge MD, FACS Entered By: Christin Fudge on 12/04/2014 14:32:45 Angela Patterson ([id number]) -------------------------------------------------------------------------------- Physician Orders Details Patient Name: Angela Patterson. Date of Service: 12/04/2014 1:00 PM Medical Record Number: [id number] Patient Account Number: [id number] Date of Birth/Sex: 1920-09-30 (81 y.o. Female) Treating RN: Junious Dresser Primary Care Physician: Einar Pheasant Other Clinician: Referring Physician: Einar Pheasant Treating Physician/Extender: Frann Rider in Treatment: 0 Verbal / Phone Orders: Yes Clinician: Junious Dresser Read Back and Verified: Yes Diagnosis Coding Wound Cleansing Wound #2 [place] wound with Normal Saline. Anesthetic Wound #2 Left,Plantar Foot o Topical Lidocaine 4% cream applied to wound bed prior to debridement Primary Wound Dressing Wound #2 Left,Plantar Foot o Aquacel Ag Secondary Dressing Wound #2 Left,Plantar Foot o Gauze and Kerlix/Conform - ring of felt cut to fit around ulcer Dressing Change Frequency Wound #2 Left,Plantar Foot o Change dressing every other day. Follow-up Appointments Wound #2 [place] o Return Appointment in 1 week. Off-Loading Wound #2 Left,Plantar Foot o Other: - front off-loading  darco Electronic Signature(s) Signed: 12/04/2014 2:50:30 PM By: Christin Fudge MD, FACS Signed: 12/04/2014 4:40:32 PM By: Junious Dresser RN Entered By: Junious Dresser on 12/04/2014 14:05:17 Angela Patterson ([id number]) Angela Patterson, Angela Patterson ([id number]) -------------------------------------------------------------------------------- Problem List Details Patient Name: ANJU, SERENO. Date of Service: 12/04/2014 1:00 PM Medical Record Number: [id number] Patient Account Number: [id number] Date of Birth/Sex: [birth year] (81 y.o. 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 Date of Service: 12/04/2014 1:00 PM Medical Record Number: [id number] Patient Account Number: [id number] Date of Birth/Sex: 04-04-1921 (81 y.o. Female) Treating RN: Primary Care Physician: Einar Pheasant Other Clinician: Referring Physician: Einar Pheasant Treating Physician/Extender: Frann Rider in Treatment: 0 Subjective Chief Complaint Information obtained from Patient Patient presents to the wound care center for a consult due non healing wound Pleasant 81 year old comes along with her son with a recurrent ulceration and callosity on her left foot. she has had this for about 2 weeks  now. History of Present Illness (HPI) The following HPI elements were documented for the patient's wound: Location: left foot Quality: mild pain occasionally. Severity: the problem is been getting worse for the last 1 months or so. Duration: she's had this for almost a year now. Timing: pain is intermittent and occasional. Context: she was seen by me earlier in March and was healed by 10/14/2014. Associated Signs and Symptoms: worse on standing on her feet for too long. this very pleasant 81 year old patient who is extremely active and ambulating all day has had problems with the left foot for over a year. Her daughter who is her caregiver says she drives around and even goes around and does her chores by herself. she saw her PCP for this who has been taking care of this by trimming the callus and has also requested some special diabetic shoes. She has been on the shoes for about 6 months now. her diabetes is under control and her blood sugars run from a anywhere between 110 to 130 and she checks them twice a day. no fever or discharge. 10/04/2014 she has been doing very well and wearing her darko offloading shoe and her daughter has been doing the dressing appropriately. She has no fresh complaints. An x-ray of her left foot was done on 09/26/2014. There was no cortical erosion noted there was mild osteopenia in the distal first metatarsal. Radiologist has recommended a MRI of the left foot to exclude osteomyelitis. 10/11/2014 -- MRI done on 10/09/2014 reveals that the area where she had a phlegmon is without osteomyelitis. the bone marrow and other areas are compatible with reactive edema rather than osteomyelitis. She was seen by me in March 2016, after 3 visits we had healed out her left forefoot where she had a Angela Patterson, Angela Patterson. ([id number]) ulceration with condoms and callosities. She had done very well and was supposed to be fitted with diabetic shoes, and proper insoles but for  some reason this did not get done.her last hemoglobin A1c done in April 2016 was 6.8. Wound History Patient presents with 1 open wound that has been present for approximately unsure. The wound has been healed in the past but has re-opened. Laboratory tests have not been performed in the last month. Patient reportedly has not tested positive for an antibiotic resistant organism. Patient reportedly has not tested positive for osteomyelitis. Patient History Information obtained from Patient. Allergies Iodinated Contrast Media - IV Dye Family History Cancer - Father, Diabetes - Siblings, Stroke - Mother, No family history of Heart Disease, Hereditary Spherocytosis, Hypertension, Kidney Disease, Lung Disease, Seizures, Thyroid Problems, Tuberculosis. Social History Never smoker, Marital Status - Widowed,  Alcohol Use - Never, Drug Use - No History, Caffeine Use - Daily. Medical History Ear/Nose/Mouth/Throat Denies history of Chronic sinus problems/congestion, Middle ear problems Hematologic/Lymphatic Denies history of Anemia, Hemophilia, Human Immunodeficiency Virus, Lymphedema, Sickle Cell Disease Respiratory Denies history of Aspiration, Asthma, Chronic Obstructive Pulmonary Disease (COPD), Pneumothorax, Sleep Apnea, Tuberculosis Gastrointestinal Denies history of Cirrhosis , Crohn s, Hepatitis A, Hepatitis B, Hepatitis C Endocrine Patient has history of Type II Diabetes Genitourinary Denies history of End Stage Renal Disease Immunological Denies history of Lupus Erythematosus, Raynaud s, Scleroderma Musculoskeletal Denies history of Gout, Rheumatoid Arthritis, Osteoarthritis, Osteomyelitis Patient is treated with Oral Agents. Blood sugar results noted at the following times: Breakfast - 120. Medical And Surgical History Notes Cardiovascular TIA Neurologic Angela Patterson, Angela Patterson. ([id number]) TIA Review of Systems (ROS) Eyes The patient has no complaints or  symptoms. Ear/Nose/Mouth/Throat The patient has no complaints or symptoms. Hematologic/Lymphatic The patient has no complaints or symptoms. Respiratory The patient has no complaints or symptoms. Cardiovascular The patient has no complaints or symptoms. Gastrointestinal The patient has no complaints or symptoms. Endocrine The patient has no complaints or symptoms. Immunological The patient has no complaints or symptoms. Musculoskeletal The patient has no complaints or symptoms. Neurologic The patient has no complaints or symptoms. Objective Constitutional Pulse regular. Respirations normal and unlabored. Afebrile. Vitals Time Taken: 1:20 PM, Height: 65 in, Source: Stated, Weight: 105 lbs, Source: Measured, BMI: 17.5, Temperature: 97.7 F, Pulse: 67 bpm, Respiratory Rate: 16 breaths/min, Blood Pressure: 162/66 mmHg. Eyes Nonicteric. Reactive to light. Ears, Nose, Mouth, and Throat Lips, teeth, and gums WNL.Marland Kitchen Moist mucosa without lesions . Neck supple and nontender. No palpable supraclavicular or cervical adenopathy. Normal sized without goiter. Respiratory WNL. No retractions.Marland Kitchen Angela Patterson, Angela Patterson ([id number]) Cardiovascular Pedal Pulses WNL. No clubbing, cyanosis or edema. Gastrointestinal (GI) Abdomen without masses or tenderness.. No liver or spleen enlargement or tenderness.. Musculoskeletal Adexa without tenderness or enlargement.. Digits and nails w/o clubbing, cyanosis, infection, petechiae, ischemia, or inflammatory conditions.Marland Kitchen Psychiatric Judgement and insight Intact.. No evidence of depression, anxiety, or agitation.. Integumentary (Hair, Skin) she has a large callosity on the plantar aspect of the left foot near the head of the first metatarsal. It is foul- smelling and has some moisture under it.. No crepitus or fluctuance. No peri-wound warmth or erythema. No masses.. Wound #2 status is Open. Original cause of wound was Gradually Appeared. The wound is located on  the [place]. The wound measures 2.5cm length x 4cm width x 0.1cm depth; 7.854cm^2 area and 0.785cm^3 volume. The wound is limited to skin breakdown. There is no tunneling noted. There is a small amount of serosanguineous drainage noted. The wound margin is indistinct and nonvisible. There is no granulation within the wound bed. There is a large (67-100%) amount of necrotic tissue within the wound bed including Eschar and Adherent Slough. The periwound skin appearance exhibited: Callus, Moist. The periwound skin appearance did not exhibit: Crepitus, Excoriation, Fluctuance, Friable, Induration, Localized Edema, Rash, Scarring, Dry/Scaly, Maceration, Atrophie Blanche, Cyanosis, Ecchymosis, Hemosiderin Staining, Mottled, Pallor, Rubor, Erythema. Periwound temperature was noted as No Abnormality. Assessment Active Problems ICD-10 E11.621 - Type 2 diabetes mellitus with foot ulcer L97.522 - Non-pressure chronic ulcer of other part of left foot with fat layer exposed L84 - Corns and callosities Diagnoses ICD-10 E11.621: Type 2 diabetes mellitus with foot ulcer L97.522: Non-pressure chronic ulcer of other part of left foot with fat layer exposed L84: Corns and callosities Angela Patterson, Angela Patterson. ([id number]) This pleasant and who was seen by  me in March 2016 with a similar problem has now a recurrent callus and a large area which needed to be debrided sharply today. Under that there is a small ulcerated area but the crux of the problem was she did not get her diabetic shoes and has not had any protection for her feet since we discharged her at the end of March. She has a Wagner grade 2 ulceration of the foot and will need silver alginate along with offloading to be done. I have recommended a felt pad to be circularly cut around this and to wear a Darco front offloading shoe. Her son is at the bedside seems very concerned and he is going to make sure that this thing gets taken care of this time  around and that a proper referable to the PCP and then to her orthotic shoe company will be done appropriately. All questions have been answered and I will see her back next week. Procedures Wound #2 Wound #2 is a Diabetic Wound/Ulcer of the Lower Extremity located on the Left,Plantar Foot . There was a Non-Viable Tissue Open Wound/Selective [phone]) debridement with total area of 10 sq cm performed by Benyamin Jeff, Jackson Latino., MD. with the following instrument(s): Forceps and Scissors to remove Viable and Non-Viable tissue/material including Fibrin/Slough, Eschar, Skin, Callus, and Subcutaneous after achieving pain control using Lidocaine 4% Topical Solution. A time out was conducted prior to the start of the procedure. A Minimum amount of bleeding was controlled with Pressure. The procedure was tolerated well with a pain level of 0 throughout and a pain level of 0 following the procedure. Post Debridement Measurements: 1cm length x 0.6cm width x 0.3cm depth; 0.141cm^3 volume. Plan Wound Cleansing: Wound #2 Left,Plantar Foot: Clean wound with Normal Saline. Anesthetic: Wound #2 Left,Plantar Foot: Topical Lidocaine 4% cream applied to wound bed prior to debridement Primary Wound Dressing: Wound #2 Left,Plantar Foot: Aquacel Ag Secondary Dressing: Wound #2 Left,Plantar Foot: Gauze and Kerlix/Conform - ring of felt cut to fit around ulcer Dressing Change Frequency: Angela Patterson, Angela Patterson ([id number]) Wound #2 Left,Plantar Foot: Change dressing every other day. Follow-up Appointments: Wound #2 Left,Plantar Foot: Return Appointment in 1 week. Off-Loading: Wound #2 Left,Plantar Foot: Other: - front off-loading darco Follow-Up Appointments: A Patient Clinical Summary of Care was provided to EG This pleasant and who was seen by me in March 2016 with a similar problem has now a recurrent callus and a large area which needed to be debrided sharply today. Under that there is a small ulcerated area  but the crux of the problem was she did not get her diabetic shoes and has not had any protection for her feet since we discharged her at the end of March. She has a Wagner grade 2 ulceration of the foot and will need silver alginate along with offloading to be done. I have recommended a felt pad to be circularly cut around this and to wear a Darco front offloading shoe. Her son is at the bedside seems very concerned and he is going to make sure that this thing gets taken care of this time around and that a proper referable to the PCP and then to her orthotic shoe company will be done appropriately. All questions have been answered and I will see her back next week. Electronic Signature(s) Signed: 12/04/2014 3:08:23 PM By: Junious Dresser RN Signed: 12/04/2014 4:07:01 PM By: Christin Fudge MD, FACS Previous Signature: 12/04/2014 2:50:30 PM Version By: Christin Fudge MD, FACS Entered By: Junious Dresser on 12/04/2014  15:08:23 Angela Patterson, Angela Patterson ([id number]) -------------------------------------------------------------------------------- ROS/PFSH Details Patient Name: Angela Patterson, Angela Patterson. Date of Service: 12/04/2014 1:00 PM Medical Record Patient Account Number: [id number] Number: Afful, RN, BSN, Treating RN: [birth year] [phone] y.o. Velva Harman Date of Birth/Sex: Female) Other Clinician: Primary Care Physician: Einar Pheasant Treating Tyeshia Cornforth Referring Physician: Einar Pheasant Physician/Extender: Suella [place] in Treatment: 0 Information Obtained From Patient Wound History Do you currently have one or more open woundso Yes How many open wounds do you currently haveo 1 Approximately how long have you had your woundso unsure Has your wound(s) ever healed and then re-openedo Yes Have you had any lab work done in the past montho No Have you tested positive for an antibiotic resistant organism (MRSA, VRE)o No Have you tested positive for osteomyelitis (bone infection)o No Endocrine Complaints and  Symptoms: No Complaints or Symptoms Complaints and Symptoms: Negative for: Hepatitis; Thyroid disease; Polydypsia (Excessive Thirst) Medical History: Positive for: Type II Diabetes Time with diabetes: 2 years Treated with: Oral agents Blood sugar testing results: Breakfast: 120 Eyes Complaints and Symptoms: No Complaints or Symptoms Medical History: Positive for: Cataracts - removed "a few years ago" (B ); Glaucoma Ear/Nose/Mouth/Throat Complaints and Symptoms: No Complaints or Symptoms AMIAYAH, GIEBEL ([id number]) Medical History: Negative for: Chronic sinus problems/congestion; Middle ear problems Hematologic/Lymphatic Complaints and Symptoms: No Complaints or Symptoms Medical History: Negative for: Anemia; Hemophilia; Human Immunodeficiency Virus; Lymphedema; Sickle Cell Disease Respiratory Complaints and Symptoms: No Complaints or Symptoms Medical History: Negative for: Aspiration; Asthma; Chronic Obstructive Pulmonary Disease (COPD); Pneumothorax; Sleep Apnea; Tuberculosis Cardiovascular Complaints and Symptoms: No Complaints or Symptoms Medical History: Positive for: Hypertension Past Medical History Notes: TIA Gastrointestinal Complaints and Symptoms: No Complaints or Symptoms Medical History: Negative for: Cirrhosis ; Crohnos; Hepatitis A; Hepatitis B; Hepatitis C Genitourinary Medical History: Negative for: End Stage Renal Disease Immunological Complaints and Symptoms: No Complaints or Symptoms Medical History: Negative for: Lupus Erythematosus; Raynaudos; Scleroderma Integumentary (Skin) DEANNIE, RESETAR ([id number]) Medical History: Positive for: History of pressure wounds Negative for: History of Burn Musculoskeletal Complaints and Symptoms: No Complaints or Symptoms Medical History: Negative for: Gout; Rheumatoid Arthritis; Osteoarthritis; Osteomyelitis Neurologic Complaints and Symptoms: No Complaints or Symptoms Medical History: Positive  for: Neuropathy Past Medical History Notes: TIA HBO Extended History Items Eyes: Eyes: Cataracts Glaucoma Family and Social History Cancer: Yes - Father; Diabetes: Yes - Siblings; Heart Disease: No; Hereditary Spherocytosis: No; Hypertension: No; Kidney Disease: No; Lung Disease: No; Seizures: No; Stroke: Yes - Mother; Thyroid Problems: No; Tuberculosis: No; Never smoker; Marital Status - Widowed; Alcohol Use: Never; Drug Use: No History; Caffeine Use: Daily; Financial Concerns: No; Food, Clothing or Shelter Needs: No; Support System Lacking: No; Transportation Concerns: No; Advanced Directives: Yes (Not Provided); Patient does not want information on Advanced Directives; Do not resuscitate: No; Living Will: Yes (Copy provided); Medical Power of Attorney: Yes - Olen Cordial- daughter (Copy provided) Physician Affirmation I have reviewed and agree with the above information. Electronic Signature(s) Signed: 12/04/2014 2:50:30 PM By: Christin Fudge MD, FACS Signed: 12/05/2014 5:13:49 PM By: Regan Lemming BSN, RN Entered By: Christin Fudge on 12/04/2014 14:33:05 Angela Patterson ([id number]) -------------------------------------------------------------------------------- SuperBill Details Patient Name: Angela Patterson. Date of Service: 12/04/2014 Medical Record Number: [id number] Patient Account Number: [id number] Date of Birth/Sex: 31-Mar-1921 (81 y.o. Female) Treating RN: Primary Care Physician: Einar Pheasant Other Clinician: Referring Physician: Einar Pheasant Treating Physician/Extender: Frann Rider in Treatment: 0 Diagnosis Coding ICD-10 Codes Code Description E11.621 Type 2 diabetes mellitus with foot ulcer L97.522 Non-pressure  chronic ulcer of other part of left foot with fat layer exposed L84 Corns and callosities Facility Procedures CPT4 Code Description: 18841660 99213 - WOUND CARE VISIT-LEV 3 EST PT Modifier: Quantity: 1 CPT4 Code Description: 63016010 97597 - [facility]BR[facility]  WOUND 1ST 20 SQ CM OR < ICD-10 Description Diagnosis E11.621 Type 2 diabetes mellitus with 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 L84 Corns and callosities Modifier: with fat lay Quantity: 1 er exposed Physician Procedures CPT4 Code Description: 9323557 99214 - WC PHYS LEVEL 4 - EST PT ICD-10 Description Diagnosis E11.621 Type 2 diabetes mellitus with 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 L84 Corns and callosities Modifier: with fat laye Quantity: 1 r exposed CPT4 Code Description: 3220254 27062 - WC PHYS DEBR WO ANESTH 20 SQ CM ICD-10 Description Diagnosis E11.621 Type 2 diabetes mellitus with 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 L84 Corns and callosities NICKAYLA, MCINNIS  ([id number]) Modifier: with fat laye Quantity: 1 r exposed Electronic Signature(s) Signed: 12/04/2014 4:07:01 PM By: Christin Fudge MD, FACS Signed: 12/04/2014 4:40:32 PM By: Junious Dresser RN Previous Signature: 12/04/2014 2:50:30 PM Version By: Christin Fudge MD, FACS Entered By: Junious Dresser on 12/04/2014 15:05:56

## 2014-12-06 NOTE — Progress Notes (Signed)
Angela Patterson, Angela Patterson ([id number]) Visit Report for 12/04/2014 Abuse/Suicide Risk Screen Details Patient Name: Angela Patterson, Angela Patterson. Date of Service: 12/04/2014 1:00 PM Medical Record Patient Account Number: [id number] Number: Afful, RN, BSN, Treating RN: 1920-10-27 [phone] y.o. Angela Patterson Date of Birth/Sex: Female) Other Clinician: Primary Care Physician: Einar Pheasant Treating Britto, Errol Referring Physician: Einar Pheasant Physician/Extender: Weeks in Treatment: 0 Abuse/Suicide Risk Screen Items Answer ABUSE/SUICIDE RISK SCREEN: Has anyone close to you tried to hurt or harm you recentlyo No Do you feel uncomfortable with anyone in your familyo No Has anyone forced you do things that you didnot want to doo No Do you have any thoughts of harming yourselfo No Patient displays signs or symptoms of abuse and/or neglect. No Electronic Signature(s) Signed: 12/05/2014 5:13:49 PM By: Regan Lemming BSN, RN Entered By: Regan Lemming on 12/04/2014 13:24:12 Angela Patterson ([id number]) -------------------------------------------------------------------------------- Activities of Daily Living Details Patient Name: Angela Patterson, Angela Patterson. Date of Service: 12/04/2014 1:00 PM Medical Record Patient Account Number: [id number] Number: Afful, RN, BSN, Treating RN: October 28, 1920 [phone] y.o. Angela Patterson Date of Birth/Sex: Female) Other Clinician: Primary Care Physician: Einar Pheasant Treating Christin Fudge Referring Physician: Einar Pheasant Physician/Extender: Suella Grove in Treatment: 0 Activities of Daily Living Items Answer Activities of Daily Living (Please select one for each item) Drive Automobile Completely Able Take Medications Completely Able Use Telephone Completely Able Care for Appearance Completely Able Use Toilet Completely Able Bath / Shower Completely Able Dress Self Completely Able Feed Self Completely Able Walk Completely Able Get In / Out Bed Completely Able Housework Need Assistance Prepare  Meals Need Assistance Handle Money Completely Able Shop for Self Completely Able Electronic Signature(s) Signed: 12/05/2014 5:13:49 PM By: Regan Lemming BSN, RN Entered By: Regan Lemming on 12/04/2014 13:24:49 Angela Patterson ([id number]) -------------------------------------------------------------------------------- Education Assessment Details Patient Name: Angela Patterson. Date of Service: 12/04/2014 1:00 PM Medical Record Patient Account Number: [id number] Number: Afful, RN, BSN, Treating RN: [birth year] [phone] y.o. Angela Patterson Date of Birth/Sex: Female) Other Clinician: Primary Care Physician: Einar Pheasant Treating Christin Fudge Referring Physician: Einar Pheasant Physician/Extender: Suella Grove in Treatment: 0 Primary Learner Assessed: Patient Learning Preferences/Education Level/Primary Language Learning Preference: Explanation Highest Education Level: College or Above Preferred Language: English Cognitive Barrier Assessment/Beliefs Language Barrier: No Physical Barrier Assessment Impaired Vision: No Impaired Hearing: No Decreased Hand dexterity: No Knowledge/Comprehension Assessment Knowledge Level: Medium Comprehension Level: Medium Ability to understand written Medium instructions: Ability to understand verbal Medium instructions: Motivation Assessment Anxiety Level: Calm Cooperation: Cooperative Education Importance: Acknowledges Need Interest in Health Problems: Asks Questions Perception: Coherent Willingness to Engage in Self- High Management Activities: Readiness to Engage in Self- High Management Activities: Electronic Signature(s) Signed: 12/05/2014 5:13:49 PM By: Regan Lemming BSN, RN Entered By: Regan Lemming on 12/04/2014 13:26:46 Angela Patterson ([id number]) Angela Patterson, Angela Patterson ([id number]) -------------------------------------------------------------------------------- Fall Risk Assessment Details Patient Name: Angela Patterson. Date of Service: 12/04/2014  1:00 PM Medical Record Patient Account Number: [id number] Number: Afful, RN, BSN, Treating RN: 1920/12/20 [phone] y.o. Angela Patterson Date of Birth/Sex: Female) Other Clinician: Primary Care Physician: Einar Pheasant Treating Britto, Errol Referring Physician: Einar Pheasant Physician/Extender: Suella Grove in Treatment: 0 Fall Risk Assessment Items FALL RISK ASSESSMENT: History of falling - immediate or within 3 months 0 No Secondary diagnosis 0 No Ambulatory aid None/bed rest/wheelchair/nurse 0 Yes Crutches/cane/walker 0 No Furniture 0 No IV Access/Saline Lock 0 No Gait/Training Normal/bed rest/immobile 0 Yes Weak 0 No Impaired 0 No Mental Status Oriented to own ability 0 Yes Electronic Signature(s) Signed: 12/05/2014 5:13:49 PM  By: Regan Lemming BSN, RN Entered By: Regan Lemming on 12/04/2014 13:26:55 Angela Patterson ([id number]) -------------------------------------------------------------------------------- Foot Assessment Details Patient Name: Angela Patterson. Date of Service: 12/04/2014 1:00 PM Medical Record Patient Account Number: [id number] Number: Afful, RN, BSN, Treating RN: [phone] y.o. Angela Patterson Date of Birth/Sex: Female) Other Clinician: Primary Care Physician: Einar Pheasant Treating Britto, Errol Referring Physician: Einar Pheasant Physician/Extender: Suella Grove in Treatment: 0 Foot Assessment Items Site Locations + = Sensation present, - = Sensation absent, C = Callus, U = Ulcer R = Redness, W = Warmth, M = Maceration, PU = Pre-ulcerative lesion F = Fissure, S = Swelling, D = Dryness Assessment Right: Left: Other Deformity: No No Prior Foot Ulcer: No No Prior Amputation: No No Charcot Joint: No No Ambulatory Status: Ambulatory Without Help Gait: Steady Electronic Signature(s) Signed: 12/05/2014 5:13:49 PM By: Regan Lemming BSN, RN Entered By: Regan Lemming on 12/04/2014 13:27:25 Angela Patterson  ([id number]) -------------------------------------------------------------------------------- Nutrition Risk Assessment Details Patient Name: Angela Patterson. Date of Service: 12/04/2014 1:00 PM Medical Record Patient Account Number: [id number] Number: Afful, RN, BSN, Treating RN: 08-11-1920 [phone] y.o. Angela Patterson Date of Birth/Sex: Female) Other Clinician: Primary Care Physician: Einar Pheasant Treating Britto, Errol Referring Physician: Einar Pheasant Physician/Extender: Suella Grove in Treatment: 0 Height (in): 65 Weight (lbs): 105 Body Mass Index (BMI): 17.5 Nutrition Risk Assessment Items NUTRITION RISK SCREEN: I have an illness or condition that made me change the kind and/or 0 No amount of food I eat I eat fewer than two meals per day 0 No I eat few fruits and vegetables, or milk products 0 No I have three or more drinks of beer, liquor or wine almost every day 0 No I have tooth or mouth problems that make it hard for me to eat 0 No I don't always have enough money to buy the food I need 0 No I eat alone most of the time 0 No I take three or more different prescribed or over-the-counter drugs a 0 No day Without wanting to, I have lost or gained 10 pounds in the last six 0 No months I am not always physically able to shop, cook and/or feed myself 0 No Nutrition Protocols Good Risk Protocol 0 No interventions needed Moderate Risk Protocol Electronic Signature(s) Signed: 12/05/2014 5:13:49 PM By: Regan Lemming BSN, RN Entered By: Regan Lemming on 12/04/2014 13:27:01

## 2014-12-07 ENCOUNTER — Encounter: Payer: Self-pay | Admitting: *Deleted

## 2014-12-13 ENCOUNTER — Encounter: Payer: Medicare Other | Admitting: Surgery

## 2014-12-13 DIAGNOSIS — E11621 Type 2 diabetes mellitus with foot ulcer: Secondary | ICD-10-CM | POA: Diagnosis not present

## 2014-12-13 DIAGNOSIS — L84 Corns and callosities: Secondary | ICD-10-CM | POA: Diagnosis not present

## 2014-12-13 DIAGNOSIS — Z8673 Personal history of transient ischemic attack (TIA), and cerebral infarction without residual deficits: Secondary | ICD-10-CM | POA: Diagnosis not present

## 2014-12-13 DIAGNOSIS — L97522 Non-pressure chronic ulcer of other part of left foot with fat layer exposed: Secondary | ICD-10-CM | POA: Diagnosis not present

## 2014-12-14 ENCOUNTER — Telehealth: Payer: Self-pay | Admitting: Internal Medicine

## 2014-12-14 NOTE — Telephone Encounter (Signed)
Pt need to get rx to [place] to get diabetic shoe/msn

## 2014-12-14 NOTE — Telephone Encounter (Signed)
Pt would need to be seen and this addressed at appt.  Can address this at next appt if ok.

## 2014-12-14 NOTE — Progress Notes (Signed)
WYLODEAN, SHIMMEL ([id number]) Visit Report for 12/13/2014 Arrival Information Details Patient Name: Angela Patterson, Angela Patterson. Date of Service: 12/13/2014 3:45 PM Medical Record Number: [id number] Patient Account Number: [id number] Date of Birth/Sex: 1920-10-04 (81 y.o. Female) Treating RN: Cornell Barman Primary Care Physician: Einar Pheasant Other Clinician: Referring Physician: Einar Pheasant Treating Physician/Extender: BURNS, Charlean Sanfilippo in Treatment: 1 Visit Information History Since Last Visit Added or deleted any medications: No Patient Arrived: Ambulatory Any new allergies or adverse reactions: No Arrival Time: 15:55 Had a fall or experienced change in No Accompanied By: daughter activities of daily living that may affect Transfer Assistance: None risk of falls: Patient Identification Verified: Yes Signs or symptoms of abuse/neglect since last No Secondary Verification Process Yes visito Completed: Hospitalized since last visit: No Patient Requires Transmission- No Has Dressing in Place as Prescribed: Yes Based Precautions: Has Footwear/Offloading in Place as Yes Patient Has Alerts: Yes Prescribed: Patient Alerts: 09/24/14 ABI L:1.05, Left: Wedge R:0.99 Shoe DM II Pain Present Now: No Electronic Signature(s) Signed: 12/13/2014 4:59:01 PM By: Gretta Cool, RN, BSN, Kim RN, BSN Entered By: Gretta Cool, RN, BSN, Kim on 12/13/2014 15:56:02 Angela Patterson ([id number]) -------------------------------------------------------------------------------- Encounter Discharge Information Details Patient Name: Angela Patterson. Date of Service: 12/13/2014 3:45 PM Medical Record Number: [id number] Patient Account Number: [id number] Date of Birth/Sex: [birth year] (81 y.o. Female) Treating RN: Cornell Barman Primary Care Physician: Einar Pheasant Other Clinician: Referring Physician: Einar Pheasant Treating Physician/Extender: BURNS, Charlean Sanfilippo in Treatment: 1 Encounter Discharge Information Items Discharge  Pain Level: 0 Discharge Condition: Stable Ambulatory Status: Ambulatory Discharge Destination: Home Transportation: Private Auto Accompanied By: daughter Schedule Follow-up Appointment: Yes Medication Reconciliation completed and provided to Patient/Care Yes Hillari Zumwalt: Provided on Clinical Summary of Care: 12/13/2014 Form Type Recipient Paper Patient EG Electronic Signature(s) Signed: 12/13/2014 4:59:01 PM By: Gretta Cool RN, BSN, Kim RN, BSN Previous Signature: 12/13/2014 4:29:54 PM Version By: Ruthine Dose Entered By: Gretta Cool RN, BSN, Kim on 12/13/2014 16:35:47 Angela Patterson ([id number]) -------------------------------------------------------------------------------- Lower Extremity Assessment Details Patient Name: Angela Patterson. Date of Service: 12/13/2014 3:45 PM Medical Record Number: [id number] Patient Account Number: [id number] Date of Birth/Sex: [birth year] (81 y.o. Female) Treating RN: Cornell Barman Primary Care Physician: Einar Pheasant Other Clinician: Referring Physician: Einar Pheasant Treating Physician/Extender: BURNS, Charlean Sanfilippo in Treatment: 1 Edema Assessment Assessed: [Left: No] [Right: No] Edema: [Left: N] [Right: o] Vascular Assessment Pulses: Posterior Tibial Dorsalis Pedis Palpable: [Left:Yes] Extremity colors, hair growth, and conditions: Extremity Color: [Left:Normal] Hair Growth on Extremity: [Left:No] Temperature of Extremity: [Left:Warm] Capillary Refill: [Left:< 3 seconds] Toe Nail Assessment Left: Right: Thick: No Discolored: No Deformed: No Improper Length and Hygiene: No Electronic Signature(s) Signed: 12/13/2014 4:59:01 PM By: Gretta Cool, RN, BSN, Kim RN, BSN Entered By: Gretta Cool, RN, BSN, Kim on 12/13/2014 15:59:56 Angela Patterson ([id number]) -------------------------------------------------------------------------------- Multi Wound Chart Details Patient Name: Angela Patterson. Date of Service: 12/13/2014 3:45 PM Medical Record Number:  [id number] Patient Account Number: [id number] Date of Birth/Sex: 1921-03-29 (81 y.o. Female) Treating RN: Cornell Barman Primary Care Physician: Einar Pheasant Other Clinician: Referring Physician: Einar Pheasant Treating Physician/Extender: BURNS, Charlean Sanfilippo in Treatment: 1 Vital Signs Height(in): 65 Pulse(bpm): 56 Weight(lbs): 105 Blood Pressure 157/98 (mmHg): Body Mass Index(BMI): 17 Temperature(F): 97.7 Respiratory Rate 18 (breaths/min): Photos: [2:No Photos] [N/A:N/A] Wound Location: [2:Left Foot - Plantar] [N/A:N/A] Wounding Event: [2:Gradually Appeared] [N/A:N/A] Primary Etiology: [2:Diabetic Wound/Ulcer of N/A the Lower Extremity] Comorbid History: [2:Cataracts, Glaucoma, Hypertension, Type II Diabetes, History of pressure wounds, Neuropathy] [N/A:N/A] Date Acquired: [2:11/13/2014] [N/A:N/A] Weeks of Treatment: [2:1] [  N/A:N/A] Wound Status: [2:Open] [N/A:N/A] Measurements L x W x D 0.2x0.3x0.1 [N/A:N/A] (cm) Area (cm) : [2:0.047] [N/A:N/A] Volume (cm) : [2:0.005] [N/A:N/A] % Reduction in Area: [2:99.40%] [N/A:N/A] % Reduction in Volume: 99.40% [N/A:N/A] Classification: [2:Unable to visualize wound N/A bed] Exudate Amount: [2:Small] [N/A:N/A] Exudate Type: [2:Serosanguineous] [N/A:N/A] Exudate Color: [2:red, brown] [N/A:N/A] Foul Odor After [2:Yes] [N/A:N/A] Cleansing: Odor Anticipated Due to No [N/A:N/A] Product Use: Wound Margin: [2:Indistinct, nonvisible] [N/A:N/A] Granulation Amount: [2:Medium (34-66%)] [N/A:N/A] Necrotic Amount: Small (1-33%) N/A N/A Exposed Structures: Fascia: No N/A N/A Fat: No Tendon: No Muscle: No Joint: No Bone: No Limited to Skin Breakdown Epithelialization: None N/A N/A Debridement: N/A N/A N/A Periwound Skin Texture: Callus: Yes N/A N/A Edema: No Excoriation: No Induration: No Crepitus: No Fluctuance: No Friable: No Rash: No Scarring: No Periwound Skin Moist: Yes N/A N/A Moisture: Maceration: No Dry/Scaly:  No Periwound Skin Color: Atrophie Blanche: No N/A N/A Cyanosis: No Ecchymosis: No Erythema: No Hemosiderin Staining: No Mottled: No Pallor: No Rubor: No Temperature: No Abnormality N/A N/A Tenderness on No N/A N/A Palpation: Wound Preparation: Ulcer Cleansing: N/A N/A Rinsed/Irrigated with Saline Topical Anesthetic Applied: Other: Lidocaine 4% Ointment Procedures Performed: Debridement N/A N/A Treatment Notes Electronic Signature(s) Signed: 12/13/2014 4:59:01 PM By: Gretta Cool, RN, BSN, Kim RN, BSN Entered By: Gretta Cool, RN, BSN, Kim on 12/13/2014 16:18:11 Angela Patterson ([name] ([id number]) -------------------------------------------------------------------------------- [place] Details Patient Name: Angela Patterson, Angela Patterson. Date of Service: 12/13/2014 3:45 PM Medical Record Number: [id number] Patient Account Number: [id number] Date of Birth/Sex: [birth year] (81 y.o. Female) Treating RN: Cornell Barman Primary Care Physician: Einar Pheasant Other Clinician: Referring Physician: Einar Pheasant Treating Physician/Extender: BURNS, Charlean Sanfilippo in Treatment: 1 Active Inactive Orientation to the Wound Care Program Nursing Diagnoses: Knowledge deficit related to the wound healing center program Goals: Patient/caregiver will verbalize understanding of the [place] Program Date Initiated: 12/04/2014 Goal Status: Active Interventions: Provide education on orientation to the wound center Notes: Wound/Skin Impairment Nursing Diagnoses: Impaired tissue integrity Goals: Patient/caregiver will verbalize understanding of skin care regimen Date Initiated: 12/04/2014 Goal Status: Active Ulcer/skin breakdown will heal within 14 weeks Date Initiated: 12/04/2014 Goal Status: Active Interventions: Assess patient/caregiver ability to obtain necessary supplies Assess patient/caregiver ability to perform ulcer/skin care regimen upon admission and as  needed Provide education on ulcer and skin care Treatment Activities: Skin care regimen initiated : 12/13/2014 Topical wound management initiated : 12/13/2014 Angela Patterson ([id number]) Notes: Electronic Signature(s) Signed: 12/13/2014 4:59:01 PM By: Gretta Cool, RN, BSN, Kim RN, BSN Entered By: Gretta Cool, RN, BSN, Kim on 12/13/2014 16:18:03 Angela Patterson ([id number]) -------------------------------------------------------------------------------- Pain Assessment Details Patient Name: Angela Patterson. Date of Service: 12/13/2014 3:45 PM Medical Record Number: [id number] Patient Account Number: [id number] Date of Birth/Sex: [birth year] (81 y.o. Female) Treating RN: Cornell Barman Primary Care Physician: Einar Pheasant Other Clinician: Referring Physician: Einar Pheasant Treating Physician/Extender: BURNS, Charlean Sanfilippo in Treatment: 1 Active Problems Location of Pain Severity and Description of Pain Patient Has Paino No Site Locations Pain Management and Medication Current Pain Management: Electronic Signature(s) Signed: 12/13/2014 4:59:01 PM By: Gretta Cool, RN, BSN, Kim RN, BSN Entered By: Gretta Cool, RN, BSN, Kim on 12/13/2014 15:56:08 Angela Patterson ([id number]) -------------------------------------------------------------------------------- Patient/Caregiver Education Details Patient Name: Angela Patterson. Date of Service: 12/13/2014 3:45 PM Medical Record Number: [id number] Patient Account Number: [id number] Date of Birth/Gender: 16-Apr-1921 (81 y.o. Female) Treating RN: Cornell Barman Primary Care Physician: Einar Pheasant Other Clinician: Referring Physician: Einar Pheasant Treating Physician/Extender: BURNS, Charlean Sanfilippo in Treatment: 1  Education Assessment Education Provided To: Patient Education Topics Provided Offloading: Handouts: Other: Need to be fitted for diabetic shoes Methods: Explain/Verbal Responses: State content correctly Electronic Signature(s) Signed: 12/13/2014 4:59:01 PM By:  Gretta Cool, RN, BSN, Kim RN, BSN Entered By: Gretta Cool, RN, BSN, Kim on 12/13/2014 16:34:43 Angela Patterson ([id number]) -------------------------------------------------------------------------------- Wound Assessment Details Patient Name: Angela Patterson. Date of Service: 12/13/2014 3:45 PM Medical Record Number: [id number] Patient Account Number: [id number] Date of Birth/Sex: [birth year] (81 y.o. Female) Treating RN: Cornell Barman Primary Care Physician: Einar Pheasant Other Clinician: Referring Physician: Einar Pheasant Treating Physician/Extender: BURNS, Charlean Sanfilippo in Treatment: 1 Wound Status Wound Number: 2 Primary Diabetic Wound/Ulcer of the Lower Etiology: Extremity Wound Location: Left Foot - Plantar Wound Open Wounding Event: Gradually Appeared Status: Date Acquired: 11/13/2014 Comorbid Cataracts, Glaucoma, Hypertension, Weeks Of Treatment: 1 History: Type II Diabetes, History of pressure Clustered Wound: No wounds, Neuropathy Wound Measurements Length: (cm) 0.2 Width: (cm) 0.3 Depth: (cm) 0.1 Area: (cm) 0.047 Volume: (cm) 0.005 % Reduction in Area: 99.4% % Reduction in Volume: 99.4% Epithelialization: None Wound Description Classification: Unable to visualize wound bed Wound Margin: Indistinct, nonvisible Exudate Amount: Small Exudate Type: Serosanguineous Exudate Color: red, brown Foul Odor After Cleansing: Yes Due to Product Use: No Wound Bed Granulation Amount: Medium (34-66%) Exposed Structure Necrotic Amount: Small (1-33%) Fascia Exposed: No Necrotic Quality: Adherent Slough Fat Layer Exposed: No Tendon Exposed: No Muscle Exposed: No Joint Exposed: No Bone Exposed: No Limited to Skin Breakdown Periwound Skin Texture Texture Color No Abnormalities Noted: No No Abnormalities Noted: No Callus: Yes Atrophie Blanche: No Crepitus: No Cyanosis: No Excoriation: No Ecchymosis: No KAITLYNNE, WENZ ([id number]) Fluctuance: No Erythema: No Friable:  No Hemosiderin Staining: No Induration: No Mottled: No Localized Edema: No Pallor: No Rash: No Rubor: No Scarring: No Temperature / Pain Moisture Temperature: No Abnormality No Abnormalities Noted: No Dry / Scaly: No Maceration: No Moist: Yes Wound Preparation Ulcer Cleansing: Rinsed/Irrigated with Saline Topical Anesthetic Applied: Other: Lidocaine 4% Ointment, Treatment Notes Wound #2 (Left, Plantar Foot) 1. Cleansed with: Clean wound with Normal Saline 2. Anesthetic Topical Lidocaine 4% cream to wound bed prior to debridement 4. Dressing Applied: Prisma Ag 6. Footwear/Offloading device applied Multipodus Splint/Boot Notes felt for offloading, conform to hold in place Electronic Signature(s) Signed: 12/13/2014 4:59:01 PM By: Gretta Cool, RN, BSN, Kim RN, BSN Entered By: Gretta Cool, RN, BSN, Kim on 12/13/2014 16:06:52 Angela Patterson ([id number]) -------------------------------------------------------------------------------- [place] Details Patient Name: Angela Patterson. Date of Service: 12/13/2014 3:45 PM Medical Record Number: [id number] Patient Account Number: [id number] Date of Birth/Sex: [birth year] (81 y.o. Female) Treating RN: Cornell Barman Primary Care Physician: Einar Pheasant Other Clinician: Referring Physician: Einar Pheasant Treating Physician/Extender: BURNS, Charlean Sanfilippo in Treatment: 1 Vital Signs Time Taken: 15:36 Temperature (F): 97.7 Height (in): 65 Pulse (bpm): 56 Weight (lbs): 105 Respiratory Rate (breaths/min): 18 Body Mass Index (BMI): 17.5 Blood Pressure (mmHg): 157/98 Reference Range: 80 - 120 mg / dl Electronic Signature(s) Signed: 12/13/2014 4:59:01 PM By: Gretta Cool, RN, BSN, Kim RN, BSN Entered By: Gretta Cool, RN, BSN, Kim on 12/13/2014 15:56:30

## 2014-12-14 NOTE — Telephone Encounter (Signed)
Dr. Nicki Reaper,  Is this something you are willing to write for? Please advise

## 2014-12-14 NOTE — Progress Notes (Signed)
GABRIELLAH, RABEL ([id number]) Visit Report for 12/13/2014 Chief Complaint Document Details Patient Name: Angela Patterson, Angela Patterson. Date of Service: 12/13/2014 3:45 PM Medical Record Number: [id number] Patient Account Number: [id number] Date of Birth/Sex: [birth year] (81 y.o. Female) Treating RN: Primary Care Physician: Einar Pheasant Other Clinician: Referring Physician: Einar Pheasant Treating Physician/Extender: BURNS, Charlean Sanfilippo in Treatment: 1 Information Obtained from: Patient Chief Complaint Patient presents to the wound care center for a consult due non healing wound Pleasant 81 year old comes along with her son with a recurrent ulceration and callosity on her left foot. she has had this for about 2 weeks now. Electronic Signature(s) Signed: 12/13/2014 4:25:37 PM By: Loletha Grayer MD Entered By: Loletha Grayer on 12/13/2014 16:21:49 Angela Patterson ([id number]) -------------------------------------------------------------------------------- Debridement Details Patient Name: Angela Patterson. Date of Service: 12/13/2014 3:45 PM Medical Record Number: [id number] Patient Account Number: [id number] Date of Birth/Sex: [birth year] (81 y.o. Female) Treating RN: Primary Care Physician: Einar Pheasant Other Clinician: Referring Physician: Einar Pheasant Treating Physician/Extender: BURNS, Charlean Sanfilippo in Treatment: 1 Debridement Performed for Wound #2 Left,Plantar Foot Assessment: Performed By: Physician BURNS, Teressa Senter., MD Debridement: Debridement Pre-procedure Yes Verification/Time Out Taken: Start Time: 04:12 Level: Skin/Subcutaneous Tissue Total Area Debrided (L x 0.2 (cm) x 0.3 (cm) = 0.06 (cm) W): Tissue and other Viable, Non-Viable, Callus, Fat, Fibrin/Slough, Skin, Subcutaneous material debrided: Instrument: Curette, Scissors Bleeding: Minimum Hemostasis Achieved: Pressure End Time: 04:17 Procedural Pain: 0 Post Procedural Pain: 0 Response to Treatment: Procedure  was tolerated well Post Debridement Measurements of Total Wound Length: (cm) 0.2 Width: (cm) 0.3 Depth: (cm) 0.2 Volume: (cm) 0.009 Electronic Signature(s) Signed: 12/13/2014 4:25:37 PM By: Loletha Grayer MD Entered By: Loletha Grayer on 12/13/2014 16:21:38 Angela Patterson ([id number]) -------------------------------------------------------------------------------- HPI Details Patient Name: Angela Patterson. Date of Service: 12/13/2014 3:45 PM Medical Record Number: [id number] Patient Account Number: [id number] Date of Birth/Sex: [birth year] (81 y.o. Female) Treating RN: Primary Care Physician: Einar Pheasant Other Clinician: Referring Physician: Einar Pheasant Treating Physician/Extender: BURNS, Charlean Sanfilippo in Treatment: 1 History of Present Illness Location: left foot Quality: mild pain occasionally. Severity: the problem is been getting worse for the last 1 months or so. Duration: she's had this for almost a year now. Timing: pain is intermittent and occasional. Context: she was seen by me earlier in March and was healed by 10/14/2014. Associated Signs and Symptoms: worse on standing on her feet for too long. HPI Description: this very pleasant 81 year old patient who is extremely active and ambulating all day has had problems with the left foot for over a year. Her daughter who is her caregiver says she drives around and even goes around and does her chores by herself. she saw her PCP for this who has been taking care of this by trimming the callus and has also requested some special diabetic shoes. She has been on the shoes for about 6 months now. her diabetes is under control and her blood sugars run from a anywhere between 110 to 130 and she checks them twice a day. no fever or discharge. 10/04/2014 she has been doing very well and wearing her darko offloading shoe and her daughter has been doing the dressing appropriately. She has no fresh complaints. An x-ray  of her left foot was done on 09/26/2014. There was no cortical erosion noted there was mild osteopenia in the distal first metatarsal. Radiologist has recommended a MRI of the left foot to exclude osteomyelitis. 10/11/2014 -- MRI done on 10/09/2014 reveals that the  area where she had a phlegmon is without osteomyelitis. the bone marrow and other areas are compatible with reactive edema rather than osteomyelitis. She was seen by me in March 2016, after 3 visits we had healed out her left forefoot where she had a ulceration with corns and callosities. She had done very well and was supposed to be fitted with diabetic shoes, and proper insoles but for some reason this did not get done.her last hemoglobin A1c done in April 2016 was 6.8. 12/13/2014 - No complaints today. Tolerating Darco shoe. No falls. No pain. No fever or chills. No significant drainage. Electronic Signature(s) Signed: 12/13/2014 4:25:37 PM By: Loletha Grayer MD Angela Patterson ([id number]) Entered By: Loletha Grayer on 12/13/2014 16:22:52 Angela Patterson ([id number]) -------------------------------------------------------------------------------- Physical Exam Details Patient Name: Angela Patterson. Date of Service: 12/13/2014 3:45 PM Medical Record Number: [id number] Patient Account Number: [id number] Date of Birth/Sex: [birth year] (81 y.o. Female) Treating RN: Primary Care Physician: Einar Pheasant Other Clinician: Referring Physician: Einar Pheasant Treating Physician/Extender: BURNS, Charlean Sanfilippo in Treatment: 1 Constitutional . Pulse regular. Respirations normal and unlabored. Afebrile. Marland Kitchen Respiratory WNL. No retractions.. Cardiovascular Pedal Pulses WNL. Integumentary (Hair, Skin) .Marland Kitchen Neurological . Psychiatric Judgement and insight Intact.. Oriented times 3.. No evidence of depression, anxiety, or agitation.. Notes Left first metatarsal head ulceration much improved based on pictures and measurements.  Central areas full-thickness. No exposed deep structures. No probed bone. Surrounding callus and nonviable skin sharply excised. No cellulitis. Palpable DP. No edema. Electronic Signature(s) Signed: 12/13/2014 4:25:37 PM By: Loletha Grayer MD Entered By: Loletha Grayer on 12/13/2014 16:24:02 Angela Patterson ([id number]) -------------------------------------------------------------------------------- Physician Orders Details Patient Name: Angela Patterson. Date of Service: 12/13/2014 3:45 PM Medical Record Number: [id number] Patient Account Number: [id number] Date of Birth/Sex: Nov 05, 1920 (81 y.o. Female) Treating RN: Cornell Barman Primary Care Physician: Einar Pheasant Other Clinician: Referring Physician: Einar Pheasant Treating Physician/Extender: BURNS, Charlean Sanfilippo in Treatment: 1 Verbal / Phone Orders: Yes Clinician: Cornell Barman Read Back and Verified: Yes Diagnosis Coding Wound Cleansing Wound #2 Left,Plantar Foot o Clean wound with Normal Saline. Anesthetic Wound #2 Left,Plantar Foot o Topical Lidocaine 4% cream applied to wound bed prior to debridement Primary Wound Dressing Wound #2 Left,Plantar Foot o Prisma Ag Secondary Dressing Wound #2 Left,Plantar Foot o Gauze and Kerlix/Conform - ring of felt cut to fit around ulcer Dressing Change Frequency Wound #2 Left,Plantar Foot o Change dressing every other day. Follow-up Appointments Wound #2 [place] o Return Appointment in 1 week. Off-Loading Wound #2 Left,Plantar Foot o Other: - front off-loading darco Electronic Signature(s) Signed: 12/13/2014 4:25:37 PM By: Loletha Grayer MD Signed: 12/13/2014 4:59:01 PM By: Gretta Cool RN, BSN, Kim RN, BSN Entered By: Gretta Cool, RN, BSN, Kim on 12/13/2014 16:18:51 Angela Patterson ([name] ([id number]) -------------------------------------------------------------------------------- Problem List Details Patient Name: MACHAELA, CATERINO. Date  of Service: 12/13/2014 3:45 PM Medical Record Number: [id number] Patient Account Number: [id number] Date of Birth/Sex: [birth year] (81 y.o. Female) Treating RN: Primary Care Physician: Einar Pheasant Other Clinician: Referring Physician: Einar Pheasant Treating Physician/Extender: BURNS, Charlean Sanfilippo in Treatment: 1 Active Problems ICD-10 Encounter Code Description Active Date Diagnosis E11.621 Type 2 diabetes mellitus with foot ulcer 12/04/2014 Yes L97.522 Non-pressure chronic ulcer of other part of left foot with fat 12/04/2014 Yes layer exposed L84 Corns and callosities 12/04/2014 Yes Inactive Problems Resolved Problems Electronic Signature(s) Signed: 12/13/2014 4:25:37 PM By: Loletha Grayer MD Entered By: Loletha Grayer on 12/13/2014 16:20:32 Angela Sandhoff  Jerilynn Patterson ([id number]) -------------------------------------------------------------------------------- Progress Note Details Patient Name: Angela Patterson, Angela Patterson. Date of Service: 12/13/2014 3:45 PM Medical Record Number: [id number] Patient Account Number: [id number] Date of Birth/Sex: 1920-11-15 (81 y.o. Female) Treating RN: Primary Care Physician: Einar Pheasant Other Clinician: Referring Physician: Einar Pheasant Treating Physician/Extender: BURNS, Charlean Sanfilippo in Treatment: 1 Subjective Chief Complaint Information obtained from Patient Patient presents to the wound care center for a consult due non healing wound Pleasant 81 year old comes along with her son with a recurrent ulceration and callosity on her left foot. she has had this for about 2 weeks now. History of Present Illness (HPI) The following HPI elements were documented for the patient's wound: Location: left foot Quality: mild pain occasionally. Severity: the problem is been getting worse for the last 1 months or so. Duration: she's had this for almost a year now. Timing: pain is intermittent and occasional. Context: she was seen by me earlier in March and was  healed by 10/14/2014. Associated Signs and Symptoms: worse on standing on her feet for too long. this very pleasant 81 year old patient who is extremely active and ambulating all day has had problems with the left foot for over a year. Her daughter who is her caregiver says she drives around and even goes around and does her chores by herself. she saw her PCP for this who has been taking care of this by trimming the callus and has also requested some special diabetic shoes. She has been on the shoes for about 6 months now. her diabetes is under control and her blood sugars run from a anywhere between 110 to 130 and she checks them twice a day. no fever or discharge. 10/04/2014 she has been doing very well and wearing her darko offloading shoe and her daughter has been doing the dressing appropriately. She has no fresh complaints. An x-ray of her left foot was done on 09/26/2014. There was no cortical erosion noted there was mild osteopenia in the distal first metatarsal. Radiologist has recommended a MRI of the left foot to exclude osteomyelitis. 10/11/2014 -- MRI done on 10/09/2014 reveals that the area where she had a phlegmon is without osteomyelitis. the bone marrow and other areas are compatible with reactive edema rather than osteomyelitis. She was seen by me in March 2016, after 3 visits we had healed out her left forefoot where she had a Angela Patterson, Angela Patterson. ([id number]) ulceration with corns and callosities. She had done very well and was supposed to be fitted with diabetic shoes, and proper insoles but for some reason this did not get done.her last hemoglobin A1c done in April 2016 was 6.8. 12/13/2014 - No complaints today. Tolerating Darco shoe. No falls. No pain. No fever or chills. No significant drainage. Objective Constitutional Pulse regular. Respirations normal and unlabored. Afebrile. Vitals Time Taken: 3:36 PM, Height: 65 in, Weight: 105 lbs, BMI: 17.5, Temperature: 97.7  F, Pulse: 56 bpm, Respiratory Rate: 18 breaths/min, Blood Pressure: 157/98 mmHg. Respiratory WNL. No retractions.. Cardiovascular Pedal Pulses WNL. Psychiatric Judgement and insight Intact.. Oriented times 3.. No evidence of depression, anxiety, or agitation.. General Notes: Left first metatarsal head ulceration much improved based on pictures and measurements. Central areas full-thickness. No exposed deep structures. No probed bone. Surrounding callus and nonviable skin sharply excised. No cellulitis. Palpable DP. No edema. Integumentary (Hair, Skin) Wound #2 status is Open. Original cause of wound was Gradually Appeared. The wound is located on the [place]. The wound measures 0.2cm length x 0.3cm width x 0.1cm depth; 0.047cm^2  area and 0.005cm^3 volume. The wound is limited to skin breakdown. There is a small amount of serosanguineous drainage noted. The wound margin is indistinct and nonvisible. There is medium (34-66%) granulation within the wound bed. There is a small (1-33%) amount of necrotic tissue within the wound bed including Adherent Slough. The periwound skin appearance exhibited: Callus, Moist. The periwound skin appearance did not exhibit: Crepitus, Excoriation, Fluctuance, Friable, Induration, Localized Edema, Rash, Scarring, Dry/Scaly, Maceration, Atrophie Blanche, Cyanosis, Ecchymosis, Hemosiderin Staining, Mottled, Pallor, Rubor, Erythema. Periwound temperature was noted as No Abnormality. Angela Patterson, Angela Patterson ([id number]) Assessment Active Problems ICD-10 E11.621 - Type 2 diabetes mellitus with foot ulcer L97.522 - Non-pressure chronic ulcer of other part of left foot with fat layer exposed L84 - Corns and callosities Left first metatarsal head diabetic foot ulceration, Wagner grade 1. Procedures Wound #2 Wound #2 is a Diabetic Wound/Ulcer of the Lower Extremity located on the Left,Plantar Foot . There was a Skin/Subcutaneous Tissue Debridement ([id number])  debridement with total area of 0.06 sq cm performed by BURNS, Teressa Senter., MD. with the following instrument(s): Curette and Scissors to remove Viable and Non-Viable tissue/material including Fat, Fibrin/Slough, Skin, Callus, and Subcutaneous. A time out was conducted prior to the start of the procedure. A Minimum amount of bleeding was controlled with Pressure. The procedure was tolerated well with a pain level of 0 throughout and a pain level of 0 following the procedure. Post Debridement Measurements: 0.2cm length x 0.3cm width x 0.2cm depth; 0.009cm^3 volume. Plan Wound Cleansing: Wound #2 Left,Plantar Foot: Clean wound with Normal Saline. Anesthetic: Wound #2 Left,Plantar Foot: Topical Lidocaine 4% cream applied to wound bed prior to debridement Primary Wound Dressing: Wound #2 Left,Plantar Foot: Prisma Ag Secondary Dressing: Wound #2 Left,Plantar Foot: Gauze and Kerlix/Conform - ring of felt cut to fit around ulcer Angela Patterson ([id number]) Dressing Change Frequency: Wound #2 Left,Plantar Foot: Change dressing every other day. Follow-up Appointments: Wound #2 Left,Plantar Foot: Return Appointment in 1 week. Off-Loading: Wound #2 Left,Plantar Foot: Other: - front off-loading darco Prisma. Continue offloading with Darco shoe. Patient is awaiting new orthotic. Electronic Signature(s) Signed: 12/13/2014 4:25:37 PM By: Loletha Grayer MD Entered By: Loletha Grayer on 12/13/2014 16:24:52 Angela Patterson ([id number]) -------------------------------------------------------------------------------- SuperBill Details Patient Name: Angela Patterson. Date of Service: 12/13/2014 Medical Record Number: [id number] Patient Account Number: [id number] Date of Birth/Sex: 1921-01-31 (81 y.o. Female) Treating RN: Primary Care Physician: Einar Pheasant Other Clinician: Referring Physician: Einar Pheasant Treating Physician/Extender: BURNS, Charlean Sanfilippo in Treatment: 1 Diagnosis  Coding ICD-10 Codes Code Description E11.621 Type 2 diabetes mellitus with 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 with fat layer exposed L84 Corns and callosities Facility Procedures CPT4 Code: [id number] Description: 79480 - DEB SUBQ TISSUE 20 SQ CM/< ICD-10 Description Diagnosis E11.621 Type 2 diabetes mellitus with foot ulcer Modifier: Quantity: 1 Physician Procedures CPT4 Code: [id number] Description: WC PHYS LEVEL 3 o NEW PT ICD-10 Description Diagnosis E11.621 Type 2 diabetes mellitus with foot ulcer Modifier: Quantity: 1 CPT4 Code: [id number] Description: 75449 - WC PHYS SUBQ TISS 20 SQ CM ICD-10 Description Diagnosis E11.621 Type 2 diabetes mellitus with foot ulcer Modifier: Quantity: 1 Electronic Signature(s) Signed: 12/13/2014 4:25:37 PM By: Loletha Grayer MD Entered By: Loletha Grayer on 12/13/2014 16:25:09

## 2014-12-14 NOTE — Telephone Encounter (Signed)
Latoya,  Can you assist with this?  I am not sure how it is done.  Thanks

## 2014-12-14 NOTE — Telephone Encounter (Signed)
Spoke with Patient on the phone.  Patient is currently in a boot and doesn't know when the boot will come off.  I told her that she should ask at her follow up wound appointment next week if it is something they can handle writing for as they have a clear idea of what is needed.  Patient agreed and will call back if needed.

## 2014-12-20 ENCOUNTER — Encounter: Payer: Medicare Other | Attending: Surgery | Admitting: Surgery

## 2014-12-20 DIAGNOSIS — L97522 Non-pressure chronic ulcer of other part of left foot with fat layer exposed: Secondary | ICD-10-CM | POA: Diagnosis not present

## 2014-12-20 DIAGNOSIS — E11621 Type 2 diabetes mellitus with foot ulcer: Secondary | ICD-10-CM | POA: Diagnosis not present

## 2014-12-20 DIAGNOSIS — L84 Corns and callosities: Secondary | ICD-10-CM | POA: Diagnosis not present

## 2014-12-21 NOTE — Progress Notes (Signed)
TIFFNAY, BOSSI ([id number]) Visit Report for 12/20/2014 Arrival Information Details Patient Name: Angela Patterson, Angela Patterson. Date of Service: 12/20/2014 4:00 PM Medical Record Number: [id number] Patient Account Number: [id number] Date of Birth/Sex: 1921-05-07 (81 y.o. Female) Treating RN: Montey Hora Primary Care Physician: Einar Pheasant Other Clinician: Referring Physician: Einar Pheasant Treating Physician/Extender: Frann Rider in Treatment: 2 Visit Information History Since Last Visit Any new allergies or adverse reactions: No Patient Arrived: Ambulatory Had a fall or experienced change in No Arrival Time: 16:09 activities of daily living that may affect Accompanied By: dtgr risk of falls: Transfer Assistance: None Signs or symptoms of abuse/neglect since last No Patient Identification Verified: Yes visito Secondary Verification Process Yes Hospitalized since last visit: No Completed: Has Dressing in Place as Prescribed: Yes Patient Requires Transmission- No Pain Present Now: No Based Precautions: Patient Has Alerts: Yes Patient Alerts: 09/24/14 ABI L:1.05, R:0.99 DM II Electronic Signature(s) Signed: 12/20/2014 5:10:45 PM By: Montey Hora Entered By: Montey Hora on 12/20/2014 16:09:32 Angela Patterson ([id number]) -------------------------------------------------------------------------------- Encounter Discharge Information Details Patient Name: Angela Patterson. Date of Service: 12/20/2014 4:00 PM Medical Record Number: [id number] Patient Account Number: [id number] Date of Birth/Sex: August 27, 1920 (81 y.o. Female) Treating RN: Montey Hora Primary Care Physician: Einar Pheasant Other Clinician: Referring Physician: Einar Pheasant Treating Physician/Extender: Frann Rider in Treatment: 2 Encounter Discharge Information Items Discharge Pain Level: 0 Discharge Condition: Stable Ambulatory Status: Ambulatory Discharge Destination: Home Transportation: Private  Auto Accompanied By: dtr Schedule Follow-up Appointment: Yes Medication Reconciliation completed and provided to Patient/Care No Tiwanna Tuch: Provided on Clinical Summary of Care: 12/20/2014 Form Type Recipient Paper Patient EG Electronic Signature(s) Signed: 12/20/2014 4:21:52 PM By: Ruthine Dose Entered By: Ruthine Dose on 12/20/2014 16:21:52 Angela Patterson ([id number]) -------------------------------------------------------------------------------- General Visit Notes Details Patient Name: Angela Patterson. Date of Service: 12/20/2014 4:00 PM Medical Record Number: [id number] Patient Account Number: [id number] Date of Birth/Sex: August 05, 1920 (81 y.o. Female) Treating RN: Baruch Gouty, RN, BSN, Velva Harman Primary Care Physician: Einar Pheasant Other Clinician: Referring Physician: Einar Pheasant Treating Physician/Extender: Frann Rider in Treatment: 2 Notes Foam felt applied for offloading. Electronic Signature(s) Unsigned Entered By: Regan Lemming on 12/20/2014 16:47:40 Signature(s): Date(s): Angela Patterson ([id number]) -------------------------------------------------------------------------------- Lower Extremity Assessment Details Patient Name: Angela Patterson. Date of Service: 12/20/2014 4:00 PM Medical Record Number: [id number] Patient Account Number: [id number] Date of Birth/Sex: Feb 21, 1921 (81 y.o. Female) Treating RN: Montey Hora Primary Care Physician: Einar Pheasant Other Clinician: Referring Physician: Einar Pheasant Treating Physician/Extender: Frann Rider in Treatment: 2 Vascular Assessment Pulses: Posterior Tibial Extremity colors, hair growth, and conditions: Extremity Color: [Left:Normal] Hair Growth on Extremity: [Left:Yes] Temperature of Extremity: [Left:Warm] Capillary Refill: [Left:< 3 seconds] Dependent Rubor: [Left:No] Blanched when Elevated: [Left:No] Lipodermatosclerosis: [Left:No] Toe Nail Assessment Left: Right: Thick: No Discolored:  No Deformed: No Improper Length and Hygiene: No Electronic Signature(s) Signed: 12/20/2014 5:10:45 PM By: Montey Hora Entered By: Montey Hora on 12/20/2014 16:09:07 Angela Patterson ([id number]) -------------------------------------------------------------------------------- Multi Wound Chart Details Patient Name: Angela Patterson. Date of Service: 12/20/2014 4:00 PM Medical Record Number: [id number] Patient Account Number: [id number] Date of Birth/Sex: 1921/07/05 (81 y.o. Female) Treating RN: Montey Hora Primary Care Physician: Einar Pheasant Other Clinician: Referring Physician: Einar Pheasant Treating Physician/Extender: Frann Rider in Treatment: 2 Photos: [2:No Photos] [N/A:N/A] Wound Location: [2:Left, Plantar Foot] [N/A:N/A] Wounding Event: [2:Gradually Appeared] [N/A:N/A] Primary Etiology: [2:Diabetic Wound/Ulcer of N/A the Lower Extremity] Date Acquired: [2:11/13/2014] [N/A:N/A] Weeks of Treatment: [2:2] [N/A:N/A] Wound Status: [2:Open] [N/A:N/A] Measurements L x W  x D 0x0x0 [N/A:N/A] (cm) Area (cm) : [2:0] [N/A:N/A] Volume (cm) : [2:0] [N/A:N/A] % Reduction in Area: [2:100.00%] [N/A:N/A] % Reduction in Volume: 100.00% [N/A:N/A] Classification: [2:Unable to visualize wound N/A bed] Periwound Skin Texture: No Abnormalities Noted N/A Periwound Skin [2:No Abnormalities Noted N/A] Moisture: Periwound Skin Color: No Abnormalities Noted N/A Tenderness on [2:No] [N/A:N/A] Treatment Notes Electronic Signature(s) Signed: 12/20/2014 5:10:45 PM By: Montey Hora Entered By: Montey Hora on 12/20/2014 16:12:56 Angela Patterson ([id number]) -------------------------------------------------------------------------------- [place] Details Patient Name: Angela Patterson. Date of Service: 12/20/2014 4:00 PM Medical Record Number: [id number] Patient Account Number: [id number] Date of Birth/Sex: Nov 26, 1920 (81 y.o. Female) Treating RN: Montey Hora Primary Care Physician: Einar Pheasant Other Clinician: Referring Physician: Einar Pheasant Treating Physician/Extender: Frann Rider in Treatment: 2 Active Inactive Electronic Signature(s) Signed: 12/20/2014 5:10:45 PM By: Montey Hora Entered By: Montey Hora on 12/20/2014 16:14:30 Angela Patterson ([id number]) -------------------------------------------------------------------------------- Pain Assessment Details Patient Name: Angela Patterson. Date of Service: 12/20/2014 4:00 PM Medical Record Number: [id number] Patient Account Number: [id number] Date of Birth/Sex: 1920-08-23 (81 y.o. Female) Treating RN: Montey Hora Primary Care Physician: Einar Pheasant Other Clinician: Referring Physician: Einar Pheasant Treating Physician/Extender: Frann Rider in Treatment: 2 Active Problems Location of Pain Severity and Description of Pain Patient Has Paino No Site Locations Pain Management and Medication Current Pain Management: Electronic Signature(s) Signed: 12/20/2014 5:10:45 PM By: Montey Hora Entered By: Montey Hora on 12/20/2014 16:09:14 Angela Patterson ([id number]) -------------------------------------------------------------------------------- Patient/Caregiver Education Details Patient Name: Angela Patterson. Date of Service: 12/20/2014 4:00 PM Medical Record Number: [id number] Patient Account Number: [id number] Date of Birth/Gender: February 16, 1921 (81 y.o. Female) Treating RN: Montey Hora Primary Care Physician: Einar Pheasant Other Clinician: Referring Physician: Einar Pheasant Treating Physician/Extender: Frann Rider in Treatment: 2 Education Assessment Education Provided To: Patient and Caregiver Education Topics Provided Offloading: Handouts: Other: need for diabetic shoes - must have Methods: Explain/Verbal Responses: State content correctly Electronic Signature(s) Signed: 12/20/2014 5:10:45 PM By: Montey Hora Entered By:  Montey Hora on 12/20/2014 16:13:38 Angela Patterson ([id number]) -------------------------------------------------------------------------------- Wound Assessment Details Patient Name: Angela Patterson. Date of Service: 12/20/2014 4:00 PM Medical Record Number: [id number] Patient Account Number: [id number] Date of Birth/Sex: 02/10/1921 (81 y.o. Female) Treating RN: Montey Hora Primary Care Physician: Einar Pheasant Other Clinician: Referring Physician: Einar Pheasant Treating Physician/Extender: Frann Rider in Treatment: 2 Wound Status Wound Number: 2 Primary Diabetic Wound/Ulcer of the Lower Etiology: Extremity Wound Location: Left, Plantar Foot Wound Status: Open Wounding Event: Gradually Appeared Date Acquired: 11/13/2014 Weeks Of Treatment: 2 Clustered Wound: No Photos Photo Uploaded By: Regan Lemming on 12/20/2014 16:43:10 Wound Measurements Length: (cm) Width: (cm) Depth: (cm) Area: (cm) Volume: (cm) 0 % Reduction in Area: 100% 0 % Reduction in Volume: 100% 0 0 0 Wound Description Classification: Unable to visualize wound bed Periwound Skin Texture Texture Color No Abnormalities Noted: No No Abnormalities Noted: No Moisture No Abnormalities Noted: No Electronic Signature(s) Signed: 12/20/2014 5:10:45 PM By: Shelva Majestic ([id number]) Entered By: Montey Hora on 12/20/2014 16:08:48 Angela Patterson ([id number]) -------------------------------------------------------------------------------- Vitals Details Patient Name: Angela Patterson. Date of Service: 12/20/2014 4:00 PM Medical Record Number: [id number] Patient Account Number: [id number] Date of Birth/Sex: 1921/02/11 (81 y.o. Female) Treating RN: Montey Hora Primary Care Physician: Einar Pheasant Other Clinician: Referring Physician: Einar Pheasant Treating Physician/Extender: Frann Rider in Treatment: 2 Vital Signs Time Taken: 16:09 Temperature (F): 97.6 Height  (in): 65 Pulse (bpm): 72 Weight (lbs): 105 Respiratory  Rate (breaths/min): 16 Body Mass Index (BMI): 17.5 Blood Pressure (mmHg): 118/76 Reference Range: 80 - 120 mg / dl Electronic Signature(s) Signed: 12/20/2014 4:46:46 PM By: Regan Lemming BSN, RN Entered By: Regan Lemming on 12/20/2014 16:30:17

## 2014-12-21 NOTE — Progress Notes (Signed)
Angela Patterson, Angela Patterson ([id number]) Visit Report for 12/20/2014 Chief Complaint Document Details Patient Name: Angela Patterson, Angela Patterson. Date of Service: 12/20/2014 4:00 PM Medical Record Number: [id number] Patient Account Number: [id number] Date of Birth/Sex: 12/11/1920 (81 y.o. Female) Treating RN: Primary Care Physician: Einar Pheasant Other Clinician: Referring Physician: Einar Pheasant Treating Physician/Extender: Frann Rider in Treatment: 2 Information Obtained from: Patient Chief Complaint Patient presents to the wound care center for a consult due non healing wound Pleasant 81 year old comes along with her son with a recurrent ulceration and callosity on her left foot. she has had this for about 2 weeks now. Electronic Signature(s) Signed: 12/20/2014 4:40:03 PM By: Christin Fudge MD, FACS Entered By: Christin Fudge on 12/20/2014 16:36:33 Angela Patterson ([id number]) -------------------------------------------------------------------------------- HPI Details Patient Name: Angela Patterson. Date of Service: 12/20/2014 4:00 PM Medical Record Number: [id number] Patient Account Number: [id number] Date of Birth/Sex: 09-08-1920 (81 y.o. Female) Treating RN: Primary Care Physician: Einar Pheasant Other Clinician: Referring Physician: Einar Pheasant Treating Physician/Extender: Frann Rider in Treatment: 2 History of Present Illness Location: left foot Quality: mild pain occasionally. Severity: the problem is been getting worse for the last 1 months or so. Duration: she's had this for almost a year now. Timing: pain is intermittent and occasional. Context: she was seen by me earlier in March and was healed by 10/14/2014. Associated Signs and Symptoms: worse on standing on her feet for too long. HPI Description: this very pleasant 81 year old patient who is extremely active and ambulating all day has had problems with the left foot for over a year. Her daughter who is her caregiver says she  drives around and even goes around and does her chores by herself. she saw her PCP for this who has been taking care of this by trimming the callus and has also requested some special diabetic shoes. She has been on the shoes for about 6 months now. her diabetes is under control and her blood sugars run from a anywhere between 110 to 130 and she checks them twice a day. no fever or discharge. 10/04/2014 she has been doing very well and wearing her darko offloading shoe and her daughter has been doing the dressing appropriately. She has no fresh complaints. An x-ray of her left foot was done on 09/26/2014. There was no cortical erosion noted there was mild osteopenia in the distal first metatarsal. Radiologist has recommended a MRI of the left foot to exclude osteomyelitis. 10/11/2014 -- MRI done on 10/09/2014 reveals that the area where she had a phlegmon is without osteomyelitis. the bone marrow and other areas are compatible with reactive edema rather than osteomyelitis. She was seen by me in March 2016, after 3 visits we had healed out her left forefoot where she had a ulceration with corns and callosities. She had done very well and was supposed to be fitted with diabetic shoes, and proper insoles but for some reason this did not get done.her last hemoglobin A1c done in April 2016 was 6.8. 12/13/2014 - No complaints today. Tolerating Darco shoe. No falls. No pain. No fever or chills. No significant drainage. Electronic Signature(s) Signed: 12/20/2014 4:40:03 PM By: Christin Fudge MD, FACS Angela Patterson ([id number]) Entered By: Christin Fudge on 12/20/2014 16:36:43 Angela Patterson ([id number]) -------------------------------------------------------------------------------- Physical Exam Details Patient Name: Angela Patterson. Date of Service: 12/20/2014 4:00 PM Medical Record Number: [id number] Patient Account Number: [id number] Date of Birth/Sex: [birth year] (81 y.o. Female) Treating  RN: Primary Care Physician: Einar Pheasant Other Clinician: Referring Physician:  SCOTT, CHARLENE Treating Physician/Extender: Christin Fudge Weeks in Treatment: 2 Constitutional . Pulse regular. Respirations normal and unlabored. Afebrile. . Eyes Nonicteric. Reactive to light. Ears, Nose, Mouth, and Throat Lips, teeth, and gums WNL.Marland Kitchen Moist mucosa without lesions . Neck supple and nontender. No palpable supraclavicular or cervical adenopathy. Normal sized without goiter. Respiratory WNL. No retractions.. Cardiovascular Pedal Pulses WNL. No clubbing, cyanosis or edema. Musculoskeletal Adexa without tenderness or enlargement.. Digits and nails w/o clubbing, cyanosis, infection, petechiae, ischemia, or inflammatory conditions.. Integumentary (Hair, Skin) No suspicious lesions. the ulcerated area is completely healed and there is no open ulceration.. No crepitus or fluctuance. No peri-wound warmth or erythema. No masses.Marland Kitchen Psychiatric Judgement and insight Intact.. No evidence of depression, anxiety, or agitation.. Electronic Signature(s) Signed: 12/20/2014 4:40:03 PM By: Christin Fudge MD, FACS Entered By: Christin Fudge on 12/20/2014 16:37:41 Angela Patterson ([id number]) -------------------------------------------------------------------------------- Physician Orders Details Patient Name: Angela Patterson. Date of Service: 12/20/2014 4:00 PM Medical Record Number: [id number] Patient Account Number: [id number] Date of Birth/Sex: March 31, 1921 (81 y.o. Female) Treating RN: Montey Hora Primary Care Physician: Einar Pheasant Other Clinician: Referring Physician: Einar Pheasant Treating Physician/Extender: Frann Rider in Treatment: 2 Verbal / Phone Orders: Yes Clinician: Montey Hora Read Back and Verified: Yes Diagnosis Coding Discharge From [facility] Services o Discharge from [place] - you must get diabetic shoes Electronic Signature(s) Signed: 12/20/2014 4:40:03 PM By:  Christin Fudge MD, FACS Signed: 12/20/2014 5:10:45 PM By: Montey Hora Entered By: Montey Hora on 12/20/2014 16:14:56 Angela Patterson ([id number]) -------------------------------------------------------------------------------- Problem List Details Patient Name: Angela Patterson. Date of Service: 12/20/2014 4:00 PM Medical Record Number: [id number] Patient Account Number: [id number] Date of Birth/Sex: 1920-10-25 (81 y.o. Female) Treating RN: Primary Care Physician: Einar Pheasant Other Clinician: Referring Physician: Einar Pheasant Treating Physician/Extender: Frann Rider in Treatment: 2 Active Problems ICD-10 Encounter Code Description Active Date Diagnosis E11.621 Type 2 diabetes mellitus with foot ulcer 12/04/2014 Yes L97.522 Non-pressure chronic ulcer of other part of left foot with fat 12/04/2014 Yes layer exposed L84 Corns and callosities 12/04/2014 Yes Inactive Problems Resolved Problems Electronic Signature(s) Signed: 12/20/2014 4:40:03 PM By: Christin Fudge MD, FACS Entered By: Christin Fudge on 12/20/2014 16:36:25 Angela Patterson ([id number]) -------------------------------------------------------------------------------- Progress Note Details Patient Name: Angela Patterson. Date of Service: 12/20/2014 4:00 PM Medical Record Number: [id number] Patient Account Number: [id number] Date of Birth/Sex: 12-Dec-1920 (81 y.o. Female) Treating RN: Primary Care Physician: Einar Pheasant Other Clinician: Referring Physician: Einar Pheasant Treating Physician/Extender: Frann Rider in Treatment: 2 Subjective Chief Complaint Information obtained from Patient Patient presents to the wound care center for a consult due non healing wound Pleasant 81 year old comes along with her son with a recurrent ulceration and callosity on her left foot. she has had this for about 2 weeks now. History of Present Illness (HPI) The following HPI elements were documented for the patient's  wound: Location: left foot Quality: mild pain occasionally. Severity: the problem is been getting worse for the last 1 months or so. Duration: she's had this for almost a year now. Timing: pain is intermittent and occasional. Context: she was seen by me earlier in March and was healed by 10/14/2014. Associated Signs and Symptoms: worse on standing on her feet for too long. this very pleasant 81 year old patient who is extremely active and ambulating all day has had problems with the left foot for over a year. Her daughter who is her caregiver says she drives around and even goes around and does her chores by  herself. she saw her PCP for this who has been taking care of this by trimming the callus and has also requested some special diabetic shoes. She has been on the shoes for about 6 months now. her diabetes is under control and her blood sugars run from a anywhere between 110 to 130 and she checks them twice a day. no fever or discharge. 10/04/2014 she has been doing very well and wearing her darko offloading shoe and her daughter has been doing the dressing appropriately. She has no fresh complaints. An x-ray of her left foot was done on 09/26/2014. There was no cortical erosion noted there was mild osteopenia in the distal first metatarsal. Radiologist has recommended a MRI of the left foot to exclude osteomyelitis. 10/11/2014 -- MRI done on 10/09/2014 reveals that the area where she had a phlegmon is without osteomyelitis. the bone marrow and other areas are compatible with reactive edema rather than osteomyelitis. She was seen by me in March 2016, after 3 visits we had healed out her left forefoot where she had a Angela Patterson, Angela Patterson. ([id number]) ulceration with corns and callosities. She had done very well and was supposed to be fitted with diabetic shoes, and proper insoles but for some reason this did not get done.her last hemoglobin A1c done in April 2016 was 6.8. 12/13/2014 - No  complaints today. Tolerating Darco shoe. No falls. No pain. No fever or chills. No significant drainage. Objective Constitutional Pulse regular. Respirations normal and unlabored. Afebrile. Vitals Time Taken: 4:09 PM, Height: 65 in, Weight: 105 lbs, BMI: 17.5, Temperature: 97.6 F, Pulse: 72 bpm, Respiratory Rate: 16 breaths/min, Blood Pressure: 118/76 mmHg. Eyes Nonicteric. Reactive to light. Ears, Nose, Mouth, and Throat Lips, teeth, and gums WNL.Marland Kitchen Moist mucosa without lesions . Neck supple and nontender. No palpable supraclavicular or cervical adenopathy. Normal sized without goiter. Respiratory WNL. No retractions.. Cardiovascular Pedal Pulses WNL. No clubbing, cyanosis or edema. Musculoskeletal Adexa without tenderness or enlargement.. Digits and nails w/o clubbing, cyanosis, infection, petechiae, ischemia, or inflammatory conditions.Marland Kitchen Psychiatric Judgement and insight Intact.. No evidence of depression, anxiety, or agitation.. Integumentary (Hair, Skin) No suspicious lesions. the ulcerated area is completely healed and there is no open ulceration.. No crepitus or fluctuance. No peri-wound warmth or erythema. No masses.. Wound #2 status is Open. Original cause of wound was Gradually Appeared. The wound is located on the [place]. ([id number]) Left,Plantar Foot. The wound measures 0cm length x 0cm width x 0cm depth; 0cm^2 area and 0cm^3 volume. Assessment Active Problems ICD-10 E11.621 - Type 2 diabetes mellitus with foot ulcer L97.522 - Non-pressure chronic ulcer of other part of left foot with fat layer exposed L84 - Corns and callosities The patient's wound is completely healed and I have recommended a felt offloading device to be placed on her foot and then she continued to whether dark or shoe. I spent a great deal of time discussing with the daughter who was at the bedside that my instructions for a diabetic shoe was not followed the last time around. Given  detailed instructions regarding how this has to be gotten from the [employer] in [place]. The daughter fully understands the treatment plan and will get a letter from the PCP to provide details. The patient is discharged from our services and was seen back on an as-needed basis. Plan Discharge From [facility] Services: Discharge from [place] - you must get diabetic shoes The patient's wound is completely healed and I have recommended  a felt offloading device to be placed on her foot and then she continued to whether dark or shoe. I spent a great deal of time discussing with the daughter who was at the bedside that my instructions for a diabetic shoe was not followed the last time around. Given detailed instructions regarding how this has to be gotten from the [employer] in [place]. Angela Patterson, Angela Patterson ([id number]) The daughter fully understands the treatment plan and will get a letter from the PCP to provide details. The patient is discharged from our services and was seen back on an as-needed basis. Electronic Signature(s) Signed: 12/20/2014 4:40:03 PM By: Christin Fudge MD, FACS Entered By: Christin Fudge on 12/20/2014 16:39:15 Angela Patterson ([id number]) -------------------------------------------------------------------------------- [place] Details Patient Name: Angela Patterson. Date of Service: 12/20/2014 Medical Record Number: [id number] Patient Account Number: [id number] Date of Birth/Sex: 10-28-1920 (81 y.o. Female) Treating RN: Primary Care Physician: Einar Pheasant Other Clinician: Referring Physician: Einar Pheasant Treating Physician/Extender: Frann Rider in Treatment: 2 Diagnosis Coding ICD-10 Codes Code Description E11.621 Type 2 diabetes mellitus with 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 with fat layer exposed L84 Corns and callosities Physician Procedures CPT4  Code Description: [id number] - WC PHYS LEVEL 3 - EST PT ICD-10 Description Diagnosis E11.621 Type 2 diabetes mellitus with foot ulcer L97.522 Non-pressure chronic ulcer of other part of left foo L84 Corns and callosities Modifier: t with fat laye Quantity: 1 r exposed Electronic Signature(s) Signed: 12/20/2014 4:40:03 PM By: Christin Fudge MD, FACS Entered By: Christin Fudge on 12/20/2014 16:39:32

## 2014-12-24 ENCOUNTER — Encounter: Payer: Self-pay | Admitting: Internal Medicine

## 2014-12-24 ENCOUNTER — Ambulatory Visit (INDEPENDENT_AMBULATORY_CARE_PROVIDER_SITE_OTHER): Payer: Medicare Other | Admitting: Internal Medicine

## 2014-12-24 VITALS — BP 157/73 | HR 73 | Temp 97.6°F | Ht 63.25 in | Wt 104.2 lb

## 2014-12-24 DIAGNOSIS — E1140 Type 2 diabetes mellitus with diabetic neuropathy, unspecified: Secondary | ICD-10-CM

## 2014-12-24 DIAGNOSIS — I10 Essential (primary) hypertension: Secondary | ICD-10-CM

## 2014-12-24 DIAGNOSIS — R634 Abnormal weight loss: Secondary | ICD-10-CM

## 2014-12-24 DIAGNOSIS — S91309D Unspecified open wound, unspecified foot, subsequent encounter: Secondary | ICD-10-CM | POA: Diagnosis not present

## 2014-12-24 NOTE — Progress Notes (Signed)
Patient ID: Angela Patterson, female   DOB: [birth year], 81 y.o.   MRN: [id number]   Subjective:    Patient ID: Angela Patterson, female    DOB: [birth year], 81 y.o.   MRN: [id number]  HPI  Patient here as a work in with concerns regarding a persistent foot lesion/wound and is in need of diabetic shoes.  This has been present for months.  Has been to the wound center.  They have been following.  Still not healed.  They recommended for her to get diabetic shoes.  She lives by herself and is still very active.  Needs to be able to walk around and take care of herself and things around her house.  States she is eating and drinking well.  Sugars have been doing better.    Past Medical History  Diagnosis Date  . Hypertension   . Hypercholesterolemia   . Diabetes mellitus   . CVA (cerebral vascular accident)   . Chicken pox   . Allergy     Current Outpatient Prescriptions on File Prior to Visit  Medication Sig Dispense Refill  . amLODipine (NORVASC) 5 MG tablet TAKE ONE (1) TABLET BY MOUTH TWO (2) TIMES DAILY 180 tablet 1  . aspirin 81 MG tablet Take 81 mg by mouth daily.    . Calcium Carbonate-Vitamin D (CALCIUM 600+D) 600-400 MG-UNIT per tablet Take 1 tablet by mouth 2 (two) times daily.    . Cholecalciferol (VITAMIN D3) 2000 UNITS TABS Take 1 tablet by mouth daily.    . clopidogrel (PLAVIX) 75 MG tablet TAKE ONE (1) TABLET BY MOUTH EVERY DAY 90 tablet 1  . felodipine (PLENDIL) 5 MG 24 hr tablet TAKE ONE TABLET TWICE DAILY 60 tablet 5  . fish oil-omega-3 fatty acids 1000 MG capsule Take 2 g by mouth daily.    Marland Kitchen glucose blood test strip TEST BLOOD GLUCOSE LEVELS TWICE DAILY. One Touch. Dx E11.9 100 each 5  . hydrochlorothiazide (HYDRODIURIL) 25 MG tablet Take 25 mg by mouth daily.    Elmore Guise Devices (ONE TOUCH DELICA LANCING DEV) MISC Use twice daily Dx: 250.00 100 each 5  . metFORMIN (GLUCOPHAGE) 500 MG tablet TAKE ONE TABLET BY MOUTH TWICE DAILY WITH FOOD AS DIRECTED 180 tablet 1  . metFORMIN  (GLUCOPHAGE) 500 MG tablet TAKE ONE TABLET BY MOUTH TWICE DAILY WITH FOOD AS DIRECTED 60 tablet 6  . Multiple Vitamin (MULTIVITAMIN) tablet Take 1 tablet by mouth daily.    . timolol (BETIMOL) 0.5 % ophthalmic solution 1 drop 2 (two) times daily.     No current facility-administered medications on file prior to visit.    Review of Systems  Constitutional: Negative for appetite change and unexpected weight change (weight overall stable from  our last check.  son was concerned.  ).  Respiratory: Negative for cough and shortness of breath.   Gastrointestinal: Negative for nausea, vomiting, abdominal pain and diarrhea.  Musculoskeletal: Negative for back pain and joint swelling.  Skin:       Wound plantar surface of foot with hard thickened skin surrounding.  Open area in center.  No surrounding erythema.    Neurological: Negative for dizziness and light-headedness.       Objective:    Physical Exam  Constitutional: She appears well-developed and well-nourished. No distress.  HENT:  Nose: Nose normal.  Mouth/Throat: Oropharynx is clear and moist.  Neck: Neck supple.  Cardiovascular: Normal rate and regular rhythm.   Pulmonary/Chest: Breath sounds normal. No respiratory distress. She  has no wheezes.  Abdominal: Soft. Bowel sounds are normal. There is no tenderness.  Musculoskeletal: She exhibits no tenderness.  Lymphadenopathy:    She has no cervical adenopathy.  Skin:  Circular wound plantar surface of foot.  Hard thickened skin surrounding.  Open central area.  No surrounding erythema.  DP pulse palpable and equal bilateral.    BP 157/73 mmHg  Pulse 73  Temp(Src) 97.6 F (36.4 C) (Oral)  Ht 5' 3.25" (1.607 m)  Wt 104 lb 4 oz (47.287 kg)  BMI 18.31 kg/m2  SpO2 97%  LMP 07/21/1966 Wt Readings from Last 3 Encounters:  12/24/14 104 lb 4 oz (47.287 kg)  10/23/14 105 lb 6 oz (47.798 kg)  07/09/14 105 lb 8 oz (47.854 kg)     Lab Results  Component Value Date   WBC 6.0  02/16/2014   HGB 15.4* 02/16/2014   HCT 45.9 02/16/2014   PLT 159.0 02/16/2014   GLUCOSE 136* 10/19/2014   CHOL 163 10/19/2014   TRIG 122.0 10/19/2014   HDL 48.70 10/19/2014   LDLDIRECT 66.4 05/24/2013   LDLCALC 90 10/19/2014   ALT 11 10/19/2014   AST 18 10/19/2014   NA 140 10/19/2014   K 4.1 10/19/2014   CL 104 10/19/2014   CREATININE 0.61 10/19/2014   BUN 12 10/19/2014   CO2 32 10/19/2014   TSH 4.11 10/19/2014   HGBA1C 6.8* 10/19/2014   MICROALBUR 1.4 10/19/2014       Assessment & Plan:   Problem List Items Addressed This Visit    Diabetes mellitus    Is diabetic.  Has neuropathy - evident on exam.  Persistent foot wound.  Needs diabetic shoes.  Followed at wound clinic.  Form completed for shoes.        Hypertension - Primary    Blood pressure is under good control.  Same medication regimen.       Open wound of foot excluding toes without complication    Persistent open area and lesion.  Has been to the wound center.  In need of diabetic shoes.  Has diabetes and neuropathy as well.  Form completed for shoes.  Continue to f/u with wound center.        Weight loss    Discussed weight loss and discussed nutritional supplements.          I spent 25 minutes with the patient and more than 50% of the time was spent in consultation regarding the above.     Einar Pheasant, MD

## 2014-12-24 NOTE — Progress Notes (Signed)
Pre visit review using our clinic review tool, if applicable. No additional management support is needed unless otherwise documented below in the visit note. 

## 2014-12-26 ENCOUNTER — Encounter: Payer: Self-pay | Admitting: Internal Medicine

## 2014-12-26 ENCOUNTER — Telehealth: Payer: Self-pay | Admitting: *Deleted

## 2014-12-26 DIAGNOSIS — S91309A Unspecified open wound, unspecified foot, initial encounter: Secondary | ICD-10-CM | POA: Insufficient documentation

## 2014-12-26 NOTE — Telephone Encounter (Signed)
Note in system.  Please fax.  Thanks.

## 2014-12-26 NOTE — Telephone Encounter (Signed)
Pharmacist from [facility] called requesting OV notes from 6.6.16 indicating need for DM shoes.  Office notes not in system.  Requesting office notes be faxed to [phone].  Please advise

## 2014-12-26 NOTE — Assessment & Plan Note (Signed)
Persistent open area and lesion.  Has been to the wound center.  In need of diabetic shoes.  Has diabetes and neuropathy as well.  Form completed for shoes.  Continue to f/u with wound center.

## 2014-12-26 NOTE — Telephone Encounter (Signed)
Office notes faxed to pharmacy

## 2014-12-26 NOTE — Assessment & Plan Note (Signed)
Discussed weight loss and discussed nutritional supplements.

## 2014-12-26 NOTE — Assessment & Plan Note (Signed)
Is diabetic.  Has neuropathy - evident on exam.  Persistent foot wound.  Needs diabetic shoes.  Followed at wound clinic.  Form completed for shoes.

## 2014-12-26 NOTE — Assessment & Plan Note (Signed)
Blood pressure is under good control.  Same medication regimen.

## 2015-01-24 DIAGNOSIS — E119 Type 2 diabetes mellitus without complications: Secondary | ICD-10-CM | POA: Diagnosis not present

## 2015-02-16 ENCOUNTER — Other Ambulatory Visit: Payer: Self-pay | Admitting: Internal Medicine

## 2015-02-18 ENCOUNTER — Other Ambulatory Visit: Payer: Self-pay | Admitting: Internal Medicine

## 2015-02-22 ENCOUNTER — Encounter: Payer: Self-pay | Admitting: Internal Medicine

## 2015-02-22 ENCOUNTER — Ambulatory Visit (INDEPENDENT_AMBULATORY_CARE_PROVIDER_SITE_OTHER): Payer: Medicare Other | Admitting: Internal Medicine

## 2015-02-22 VITALS — BP 118/70 | HR 69 | Temp 98.0°F | Ht 63.25 in | Wt 106.5 lb

## 2015-02-22 DIAGNOSIS — I639 Cerebral infarction, unspecified: Secondary | ICD-10-CM

## 2015-02-22 DIAGNOSIS — E7800 Pure hypercholesterolemia, unspecified: Secondary | ICD-10-CM

## 2015-02-22 DIAGNOSIS — E1140 Type 2 diabetes mellitus with diabetic neuropathy, unspecified: Secondary | ICD-10-CM | POA: Diagnosis not present

## 2015-02-22 DIAGNOSIS — I10 Essential (primary) hypertension: Secondary | ICD-10-CM

## 2015-02-22 DIAGNOSIS — R634 Abnormal weight loss: Secondary | ICD-10-CM

## 2015-02-22 DIAGNOSIS — Z0000 Encounter for general adult medical examination without abnormal findings: Secondary | ICD-10-CM | POA: Diagnosis not present

## 2015-02-22 DIAGNOSIS — D696 Thrombocytopenia, unspecified: Secondary | ICD-10-CM

## 2015-02-22 NOTE — Progress Notes (Signed)
Pre visit review using our clinic review tool, if applicable. No additional management support is needed unless otherwise documented below in the visit note. 

## 2015-02-22 NOTE — Progress Notes (Signed)
Patient ID: Angela Patterson, female   DOB: 10/27/1920, 81 y.o.   MRN: [id number]   Subjective:    Patient ID: Angela Patterson, female    DOB: Dec 22, 1920, 81 y.o.   MRN: [id number]  HPI  Patient here for a scheduled follow up.  Weight is up a couple of pounds.  She is eating more regular meals.  Stays active.  No cardiac symptoms with increased activity or exertion.  No sob.  No acid reflux.  No nausea or vomiting.  Bowels stable.  Sugars in am averaging 115-130 and pm sugars averaging [phone].     Past Medical History  Diagnosis Date  . Hypertension   . Hypercholesterolemia   . Diabetes mellitus   . CVA (cerebral vascular accident)   . Chicken pox   . Allergy     Outpatient Encounter Prescriptions as of 02/22/2015  Medication Sig  . amLODipine (NORVASC) 5 MG tablet Take 1 tablet by mouth two  times daily  . aspirin 81 MG tablet Take 81 mg by mouth daily.  . Calcium Carbonate-Vitamin D (CALCIUM 600+D) 600-400 MG-UNIT per tablet Take 1 tablet by mouth 2 (two) times daily.  . Cholecalciferol (VITAMIN D3) 2000 UNITS TABS Take 1 tablet by mouth daily.  . clopidogrel (PLAVIX) 75 MG tablet Take 1 tablet by mouth  every day  . felodipine (PLENDIL) 5 MG 24 hr tablet TAKE ONE TABLET TWICE DAILY  . fish oil-omega-3 fatty acids 1000 MG capsule Take 2 g by mouth daily.  Marland Kitchen glucose blood test strip TEST BLOOD GLUCOSE LEVELS TWICE DAILY. One Touch. Dx E11.9  . hydrochlorothiazide (HYDRODIURIL) 25 MG tablet Take 25 mg by mouth daily.  Elmore Guise Devices (ONE TOUCH DELICA LANCING DEV) MISC Use twice daily Dx: 250.00  . metFORMIN (GLUCOPHAGE) 500 MG tablet TAKE ONE TABLET BY MOUTH TWICE DAILY WITH FOOD AS DIRECTED  . metFORMIN (GLUCOPHAGE) 500 MG tablet Take 1 tablet by mouth  twice a day as directed  . Multiple Vitamin (MULTIVITAMIN) tablet Take 1 tablet by mouth daily.  . timolol (BETIMOL) 0.5 % ophthalmic solution 1 drop 2 (two) times daily.  . [DISCONTINUED] metFORMIN (GLUCOPHAGE) 500 MG tablet TAKE ONE  TABLET BY MOUTH TWICE DAILY WITH FOOD AS DIRECTED   No facility-administered encounter medications on file as of 02/22/2015.    Review of Systems  Constitutional: Negative for appetite change and unexpected weight change.       Weight is up a couple of pounds.    HENT: Negative for congestion and sinus pressure.   Respiratory: Negative for cough, chest tightness and shortness of breath.   Cardiovascular: Negative for chest pain, palpitations and leg swelling.  Gastrointestinal: Negative for nausea, vomiting, abdominal pain and diarrhea.  Skin: Negative for color change and rash.  Neurological: Negative for dizziness, light-headedness and headaches.  Psychiatric/Behavioral: Negative for dysphoric mood and agitation.       Objective:    Physical Exam  Constitutional: She appears well-developed and well-nourished. No distress.  HENT:  Nose: Nose normal.  Mouth/Throat: Oropharynx is clear and moist.  Neck: Neck supple. No thyromegaly present.  Cardiovascular: Normal rate and regular rhythm.   Pulmonary/Chest: Breath sounds normal. No respiratory distress. She has no wheezes.  Abdominal: Soft. Bowel sounds are normal. There is no tenderness.  Musculoskeletal: She exhibits no edema or tenderness.  Lymphadenopathy:    She has no cervical adenopathy.  Skin: No rash noted. No erythema.  Psychiatric: She has a normal mood and affect. Her behavior  is normal.    BP 118/70 mmHg  Pulse 69  Temp(Src) 98 F (36.7 C) (Oral)  Ht 5' 3.25" (1.607 m)  Wt 106 lb 8 oz (48.308 kg)  BMI 18.71 kg/m2  SpO2 94%  LMP 07/21/1966 Wt Readings from Last 3 Encounters:  02/22/15 106 lb 8 oz (48.308 kg)  12/24/14 104 lb 4 oz (47.287 kg)  10/23/14 105 lb 6 oz (47.798 kg)     Lab Results  Component Value Date   WBC 6.0 02/16/2014   HGB 15.4* 02/16/2014   HCT 45.9 02/16/2014   PLT 159.0 02/16/2014   GLUCOSE 136* 10/19/2014   CHOL 163 10/19/2014   TRIG 122.0 10/19/2014   HDL 48.70 10/19/2014    LDLDIRECT 66.4 05/24/2013   LDLCALC 90 10/19/2014   ALT 11 10/19/2014   AST 18 10/19/2014   NA 140 10/19/2014   K 4.1 10/19/2014   CL 104 10/19/2014   CREATININE 0.61 10/19/2014   BUN 12 10/19/2014   CO2 32 10/19/2014   TSH 4.11 10/19/2014   HGBA1C 6.8* 10/19/2014   MICROALBUR 1.4 10/19/2014       Assessment & Plan:   Problem List Items Addressed This Visit    CVA (cerebral vascular accident) - Primary    Currently doing well.  On plavix.  No recurring symptoms.  Follow.       Diabetes mellitus    Sugars as outlined.  Follow met b and a1c.  Has been doing well.  Eating more regular meals.   Lab Results  Component Value Date   HGBA1C 6.8* 10/19/2014        Health care maintenance    Physical 07/09/14.  Declines mammogram and colon evaluation.       Hypercholesterolemia    Follow lipid panel.   Lab Results  Component Value Date   CHOL 163 10/19/2014   HDL 48.70 10/19/2014   LDLCALC 90 10/19/2014   LDLDIRECT 66.4 05/24/2013   TRIG 122.0 10/19/2014   CHOLHDL 3 10/19/2014        Hypertension    Blood pressure has been under good control.  Continue same medication regimen.  Follow pressures.  Follow metabolic panel.        Thrombocytopenia    Follow cbc.       Weight loss    Weight is up a couple of pounds.  Eating more regular meals.  Follow.            Einar Pheasant, MD

## 2015-02-24 ENCOUNTER — Encounter: Payer: Self-pay | Admitting: Internal Medicine

## 2015-02-24 NOTE — Assessment & Plan Note (Signed)
Physical 07/09/14.  Declines mammogram and colon evaluation.

## 2015-02-24 NOTE — Assessment & Plan Note (Signed)
Sugars as outlined.  Follow met b and a1c.  Has been doing well.  Eating more regular meals.   Lab Results  Component Value Date   HGBA1C 6.8* 10/19/2014

## 2015-02-24 NOTE — Assessment & Plan Note (Signed)
Currently doing well.  On plavix.  No recurring symptoms.  Follow.

## 2015-02-24 NOTE — Assessment & Plan Note (Signed)
Follow lipid panel.   Lab Results  Component Value Date   CHOL 163 10/19/2014   HDL 48.70 10/19/2014   LDLCALC 90 10/19/2014   LDLDIRECT 66.4 05/24/2013   TRIG 122.0 10/19/2014   CHOLHDL 3 10/19/2014

## 2015-02-24 NOTE — Assessment & Plan Note (Signed)
Weight is up a couple of pounds.  Eating more regular meals.  Follow.

## 2015-02-24 NOTE — Assessment & Plan Note (Signed)
Blood pressure has been under good control.  Continue same medication regimen.  Follow pressures.  Follow metabolic panel.   

## 2015-02-24 NOTE — Assessment & Plan Note (Signed)
Follow cbc.  

## 2015-03-14 ENCOUNTER — Other Ambulatory Visit (INDEPENDENT_AMBULATORY_CARE_PROVIDER_SITE_OTHER): Payer: Medicare Other

## 2015-03-14 DIAGNOSIS — E119 Type 2 diabetes mellitus without complications: Secondary | ICD-10-CM

## 2015-03-14 DIAGNOSIS — E7800 Pure hypercholesterolemia, unspecified: Secondary | ICD-10-CM

## 2015-03-14 DIAGNOSIS — I10 Essential (primary) hypertension: Secondary | ICD-10-CM | POA: Diagnosis not present

## 2015-03-14 DIAGNOSIS — D696 Thrombocytopenia, unspecified: Secondary | ICD-10-CM

## 2015-03-14 LAB — HEPATIC FUNCTION PANEL
ALT: 12 U/L (ref 0–35)
AST: 19 U/L (ref 0–37)
Albumin: 4.2 g/dL (ref 3.5–5.2)
Alkaline Phosphatase: 47 U/L (ref 39–117)
Bilirubin, Direct: 0.1 mg/dL (ref 0.0–0.3)
Total Bilirubin: 0.9 mg/dL (ref 0.2–1.2)
Total Protein: 7.6 g/dL (ref 6.0–8.3)

## 2015-03-14 LAB — BASIC METABOLIC PANEL
BUN: 15 mg/dL (ref 6–23)
CO2: 30 mEq/L (ref 19–32)
Calcium: 9.5 mg/dL (ref 8.4–10.5)
Chloride: 104 mEq/L (ref 96–112)
Creatinine, Ser: 0.62 mg/dL (ref 0.40–1.20)
GFR: 95.68 mL/min (ref >60.00)
Glucose, Bld: 137 mg/dL — ABNORMAL HIGH (ref 70–99)
Potassium: 4.2 mEq/L (ref 3.5–5.1)
Sodium: 140 mEq/L (ref 135–145)

## 2015-03-14 LAB — CBC WITH DIFFERENTIAL/PLATELET
Basophils Absolute: 0 10*3/uL (ref 0.0–0.1)
Basophils Relative: 0.5 % (ref 0.0–3.0)
Eosinophils Absolute: 0.2 10*3/uL (ref 0.0–0.7)
Eosinophils Relative: 3.6 % (ref 0.0–5.0)
HCT: 44.1 % (ref 36.0–46.0)
Hemoglobin: 14.8 g/dL (ref 12.0–15.0)
Lymphocytes Relative: 30.2 % (ref 12.0–46.0)
Lymphs Abs: 2 10*3/uL (ref 0.7–4.0)
MCHC: 33.6 g/dL (ref 30.0–36.0)
MCV: 93.9 fl (ref 78.0–100.0)
Monocytes Absolute: 0.5 10*3/uL (ref 0.1–1.0)
Monocytes Relative: 7.3 % (ref 3.0–12.0)
Neutro Abs: 3.9 10*3/uL (ref 1.4–7.7)
Neutrophils Relative %: 58.4 % (ref 43.0–77.0)
Platelets: 149 10*3/uL — ABNORMAL LOW (ref 150.0–400.0)
RBC: 4.69 Mil/uL (ref 3.87–5.11)
RDW: 13.2 % (ref 11.5–15.5)
WBC: 6.6 10*3/uL (ref 4.0–10.5)

## 2015-03-14 LAB — LIPID PANEL
Cholesterol: 170 mg/dL (ref 0–200)
HDL: 44.7 mg/dL (ref >39.00)
LDL Cholesterol: 96 mg/dL (ref 0–99)
NonHDL: 125.76
Total CHOL/HDL Ratio: 4
Triglycerides: 150 mg/dL — ABNORMAL HIGH (ref 0.0–149.0)
VLDL: 30 mg/dL (ref 0.0–40.0)

## 2015-03-14 LAB — HEMOGLOBIN A1C: Hgb A1c MFr Bld: 6.5 % (ref 4.6–6.5)

## 2015-03-15 ENCOUNTER — Other Ambulatory Visit: Payer: Self-pay | Admitting: Internal Medicine

## 2015-03-15 ENCOUNTER — Encounter: Payer: Self-pay | Admitting: *Deleted

## 2015-03-15 DIAGNOSIS — D696 Thrombocytopenia, unspecified: Secondary | ICD-10-CM

## 2015-03-15 NOTE — Progress Notes (Signed)
Order placed for f/u cbc.   

## 2015-04-16 ENCOUNTER — Other Ambulatory Visit (INDEPENDENT_AMBULATORY_CARE_PROVIDER_SITE_OTHER): Payer: Medicare Other

## 2015-04-16 DIAGNOSIS — D696 Thrombocytopenia, unspecified: Secondary | ICD-10-CM | POA: Diagnosis not present

## 2015-04-16 LAB — CBC WITH DIFFERENTIAL/PLATELET
Basophils Absolute: 0 10*3/uL (ref 0.0–0.1)
Basophils Relative: 0.6 % (ref 0.0–3.0)
Eosinophils Absolute: 0.1 10*3/uL (ref 0.0–0.7)
Eosinophils Relative: 1.8 % (ref 0.0–5.0)
HCT: 42.8 % (ref 36.0–46.0)
Hemoglobin: 14.2 g/dL (ref 12.0–15.0)
Lymphocytes Relative: 26.2 % (ref 12.0–46.0)
Lymphs Abs: 2 10*3/uL (ref 0.7–4.0)
MCHC: 33.2 g/dL (ref 30.0–36.0)
MCV: 95.1 fl (ref 78.0–100.0)
Monocytes Absolute: 0.8 10*3/uL (ref 0.1–1.0)
Monocytes Relative: 10.9 % (ref 3.0–12.0)
Neutro Abs: 4.6 10*3/uL (ref 1.4–7.7)
Neutrophils Relative %: 60.5 % (ref 43.0–77.0)
Platelets: 148 10*3/uL — ABNORMAL LOW (ref 150.0–400.0)
RBC: 4.5 Mil/uL (ref 3.87–5.11)
RDW: 13.4 % (ref 11.5–15.5)
WBC: 7.7 10*3/uL (ref 4.0–10.5)

## 2015-04-17 ENCOUNTER — Encounter: Payer: Self-pay | Admitting: Internal Medicine

## 2015-04-22 NOTE — Telephone Encounter (Signed)
Unread mychart message mailed to patient 

## 2015-04-25 ENCOUNTER — Other Ambulatory Visit: Payer: Self-pay | Admitting: Internal Medicine

## 2015-06-04 DIAGNOSIS — H401132 Primary open-angle glaucoma, bilateral, moderate stage: Secondary | ICD-10-CM | POA: Diagnosis not present

## 2015-07-11 ENCOUNTER — Encounter: Payer: Self-pay | Admitting: Internal Medicine

## 2015-07-11 ENCOUNTER — Ambulatory Visit (INDEPENDENT_AMBULATORY_CARE_PROVIDER_SITE_OTHER): Payer: Medicare Other | Admitting: Internal Medicine

## 2015-07-11 VITALS — BP 130/80 | HR 84 | Temp 97.4°F | Ht 63.25 in | Wt 105.0 lb

## 2015-07-11 DIAGNOSIS — R634 Abnormal weight loss: Secondary | ICD-10-CM | POA: Diagnosis not present

## 2015-07-11 DIAGNOSIS — Q845 Enlarged and hypertrophic nails: Secondary | ICD-10-CM

## 2015-07-11 DIAGNOSIS — E1140 Type 2 diabetes mellitus with diabetic neuropathy, unspecified: Secondary | ICD-10-CM

## 2015-07-11 DIAGNOSIS — Z0000 Encounter for general adult medical examination without abnormal findings: Secondary | ICD-10-CM | POA: Diagnosis not present

## 2015-07-11 DIAGNOSIS — L602 Onychogryphosis: Secondary | ICD-10-CM

## 2015-07-11 DIAGNOSIS — Z8673 Personal history of transient ischemic attack (TIA), and cerebral infarction without residual deficits: Secondary | ICD-10-CM

## 2015-07-11 DIAGNOSIS — E7800 Pure hypercholesterolemia, unspecified: Secondary | ICD-10-CM

## 2015-07-11 DIAGNOSIS — S91309D Unspecified open wound, unspecified foot, subsequent encounter: Secondary | ICD-10-CM

## 2015-07-11 DIAGNOSIS — I10 Essential (primary) hypertension: Secondary | ICD-10-CM | POA: Diagnosis not present

## 2015-07-11 DIAGNOSIS — D696 Thrombocytopenia, unspecified: Secondary | ICD-10-CM

## 2015-07-11 NOTE — Progress Notes (Signed)
Pre-visit discussion using our clinic review tool. No additional management support is needed unless otherwise documented below in the visit note.  

## 2015-07-11 NOTE — Progress Notes (Signed)
Patient ID: Angela Patterson, female   DOB: 04-02-1921, 81 y.o.   MRN: [id number]   Subjective:    Patient ID: Angela Patterson, female    DOB: 10-27-1920, 81 y.o.   MRN: [id number]  HPI  Patient with past history of hypercholesterolemia, CVA, hypertension and diabetes.  She comes in today to follow up on these issues as well as for a complete physical exam.  She is accompanied by her daughter.  History obtained from both of them.  She stays active.  No cardiac symptoms with increased activity or exertion.  Discussed eating regular meals.  Sugars averaging 120-140 in the am and 140-180s in the pm.  No nausea or vomiting.  Bowels stable.   Still has the foot lesion.  She has been applying neosporin and soaking for one hour per day.  We discussed letting the area dry out.  Hold on topical medication and soaks for now.  Discussed referral back to the wound center.  Her daughter request referral to podiatry for toe nails.  Also having issues with decreased hearing.  Request referral for formal hearing evaluation.  She did trip over a chair recently.  Hit her elbow on the counter.  No other pain.  Did not hit her head.     Past Medical History  Diagnosis Date  . Hypertension   . Hypercholesterolemia   . Diabetes mellitus ([place])   . CVA (cerebral vascular accident) ([place])   . Chicken pox   . Allergy    Past Surgical History  Procedure Laterality Date  . Appendectomy  age 61  . Dilation and curettage of uterus  1970  . Abdominal hysterectomy  1971    excessive bleeding  . Breast lumpectomy  1958    benign   Family History  Problem Relation Age of Onset  . Liver cancer Father   . Stroke Mother   . Breast cancer Neg Hx   . Colon cancer Neg Hx    Social History   Social History  . Marital Status: Widowed    Spouse Name: N/A  . Number of Children: 3  . Years of Education: N/A   Social History Main Topics  . Smoking status: Never Smoker   . Smokeless tobacco: Never Used  . Alcohol Use: No  .  Drug Use: No  . Sexual Activity: Not Asked   Other Topics Concern  . None   Social History Narrative    Outpatient Encounter Prescriptions as of 07/11/2015  Medication Sig  . amLODipine (NORVASC) 5 MG tablet Take 1 tablet by mouth two  times daily  . aspirin 81 MG tablet Take 81 mg by mouth daily.  . Calcium Carbonate-Vitamin D (CALCIUM 600+D) 600-400 MG-UNIT per tablet Take 1 tablet by mouth 2 (two) times daily.  . Cholecalciferol (VITAMIN D3) 2000 UNITS TABS Take 1 tablet by mouth daily.  . clopidogrel (PLAVIX) 75 MG tablet Take 1 tablet by mouth  every day  . felodipine (PLENDIL) 5 MG 24 hr tablet TAKE ONE TABLET TWICE DAILY  . fish oil-omega-3 fatty acids 1000 MG capsule Take 2 g by mouth daily.  . hydrochlorothiazide (HYDRODIURIL) 25 MG tablet Take 25 mg by mouth daily.  Elmore Guise Devices (ONE TOUCH DELICA LANCING DEV) MISC Use twice daily Dx: 250.00  . metFORMIN (GLUCOPHAGE) 500 MG tablet Take 1 tablet by mouth  twice a day as directed  . Multiple Vitamin (MULTIVITAMIN) tablet Take 1 tablet by mouth daily.  . ONE TOUCH ULTRA TEST  test strip TEST BG TWICE DAILY  . timolol (BETIMOL) 0.5 % ophthalmic solution 1 drop 2 (two) times daily.  . [DISCONTINUED] metFORMIN (GLUCOPHAGE) 500 MG tablet TAKE ONE TABLET BY MOUTH TWICE DAILY WITH FOOD AS DIRECTED   No facility-administered encounter medications on file as of 07/11/2015.    Review of Systems  Constitutional: Negative for appetite change and unexpected weight change.  HENT: Negative for congestion and sinus pressure.   Eyes: Negative for pain and visual disturbance.  Respiratory: Negative for cough, chest tightness and shortness of breath.   Cardiovascular: Negative for chest pain, palpitations and leg swelling.  Gastrointestinal: Negative for nausea, vomiting, abdominal pain and diarrhea.  Genitourinary: Negative for dysuria and difficulty urinating.  Musculoskeletal: Negative for back pain and joint swelling.  Skin: Negative  for color change and rash.       Persistent foot lesion/wound.    Neurological: Negative for dizziness and headaches.  Hematological: Negative for adenopathy. Does not bruise/bleed easily.  Psychiatric/Behavioral: Negative for dysphoric mood and agitation.       Objective:    Physical Exam  Constitutional: She is oriented to person, place, and time. She appears well-developed and well-nourished. No distress.  HENT:  Nose: Nose normal.  Mouth/Throat: Oropharynx is clear and moist.  Eyes: Right eye exhibits no discharge. Left eye exhibits no discharge. No scleral icterus.  Neck: Neck supple. No thyromegaly present.  Cardiovascular: Normal rate and regular rhythm.   Pulmonary/Chest: Breath sounds normal. No accessory muscle usage. No tachypnea. No respiratory distress. She has no decreased breath sounds. She has no wheezes. She has no rhonchi. Right breast exhibits no inverted nipple, no mass, no nipple discharge and no tenderness (no axillary adenopathy). Left breast exhibits no inverted nipple, no mass, no nipple discharge and no tenderness (no axilarry adenopathy).  Abdominal: Soft. Bowel sounds are normal. There is no tenderness.  Musculoskeletal: She exhibits no edema or tenderness.  Lymphadenopathy:    She has no cervical adenopathy.  Neurological: She is alert and oriented to person, place, and time.  Skin: Skin is warm. No rash noted. No erythema.  Persistent circular wound/lesion - bottom of foot.  No surrounding erythema.   Skin abrasion - left elbow.  No surrounding erythema.   Psychiatric: She has a normal mood and affect. Her behavior is normal.    BP 130/80 mmHg  Pulse 84  Temp(Src) 97.4 F (36.3 C) (Oral)  Ht 5' 3.25" (1.607 m)  Wt 105 lb (47.628 kg)  BMI 18.44 kg/m2  SpO2 96%  LMP 07/21/1966 Wt Readings from Last 3 Encounters:  07/11/15 105 lb (47.628 kg)  02/22/15 106 lb 8 oz (48.308 kg)  12/24/14 104 lb 4 oz (47.287 kg)     Lab Results  Component Value  Date   WBC 7.7 04/16/2015   HGB 14.2 04/16/2015   HCT 42.8 04/16/2015   PLT 148.0* 04/16/2015   GLUCOSE 137* 03/14/2015   CHOL 170 03/14/2015   TRIG 150.0* 03/14/2015   HDL 44.70 03/14/2015   LDLDIRECT 66.4 05/24/2013   LDLCALC 96 03/14/2015   ALT 12 03/14/2015   AST 19 03/14/2015   NA 140 03/14/2015   K 4.2 03/14/2015   CL 104 03/14/2015   CREATININE 0.62 03/14/2015   BUN 15 03/14/2015   CO2 30 03/14/2015   TSH 4.11 10/19/2014   HGBA1C 6.5 03/14/2015   MICROALBUR 1.4 10/19/2014       Assessment & Plan:   Problem List Items Addressed This Visit  Diabetes mellitus ([place])    Sugars as outlined.  Discussed continuing to eat regular meals and snacks.  Follow met b and a1c.   Lab Results  Component Value Date   HGBA1C 6.5 03/14/2015        Relevant Orders   Hemoglobin A1c   Health care maintenance    Physical today 07/11/15.  Declines mammogram and colon evaluation.       History of CVA (cerebrovascular accident)    On plavix.  Doing well.        Hypercholesterolemia    Low cholesterol diet and exercise.  Follow lipid panel.   Lab Results  Component Value Date   CHOL 170 03/14/2015   HDL 44.70 03/14/2015   LDLCALC 96 03/14/2015   LDLDIRECT 66.4 05/24/2013   TRIG 150.0* 03/14/2015   CHOLHDL 4 03/14/2015        Relevant Orders   Lipid panel   Hepatic function panel   Hypertension    Blood pressure has been doing well.  Same medication regimen.  Follow pressures.  Follow metabolic panel.        Relevant Orders   Basic metabolic panel   Open wound of foot excluding toes without complication    Persistent.  Discussed referral back to wound center.  She is in agreement.  Order placed for referral.  Stop the neosporin.  Will hold on soaking.  Has been soaking for one hour.  Also, daughter wanted referral to podiatry for toe nails as well.        Relevant Orders   AMB referral to wound care center   Ambulatory referral to Podiatry   Thrombocytopenia  ([place])    Last platelet count stable at 148.  Follow.        Relevant Orders   CBC with Differential/Platelet   Weight loss    Weight stable over the last few checks.  Discussed diet and eating regular meals and snacks.  Follow.         Other Visit Diagnoses    Routine general medical examination at a health care facility    -  Primary    Thickened nails        Relevant Orders    Ambulatory referral to Podiatry        Einar Pheasant, MD

## 2015-07-16 ENCOUNTER — Encounter: Payer: Self-pay | Admitting: Internal Medicine

## 2015-07-16 NOTE — Assessment & Plan Note (Signed)
Blood pressure has been doing well.  Same medication regimen.  Follow pressures.  Follow metabolic panel.   

## 2015-07-16 NOTE — Assessment & Plan Note (Signed)
Weight stable over the last few checks.  Discussed diet and eating regular meals and snacks.  Follow.

## 2015-07-16 NOTE — Assessment & Plan Note (Signed)
Persistent.  Discussed referral back to wound center.  She is in agreement.  Order placed for referral.  Stop the neosporin.  Will hold on soaking.  Has been soaking for one hour.  Also, daughter wanted referral to podiatry for toe nails as well.

## 2015-07-16 NOTE — Assessment & Plan Note (Signed)
On plavix.  Doing well.  

## 2015-07-16 NOTE — Assessment & Plan Note (Signed)
Low cholesterol diet and exercise.  Follow lipid panel.   Lab Results  Component Value Date   CHOL 170 03/14/2015   HDL 44.70 03/14/2015   LDLCALC 96 03/14/2015   LDLDIRECT 66.4 05/24/2013   TRIG 150.0* 03/14/2015   CHOLHDL 4 03/14/2015

## 2015-07-16 NOTE — Assessment & Plan Note (Signed)
Sugars as outlined.  Discussed continuing to eat regular meals and snacks.  Follow met b and a1c.   Lab Results  Component Value Date   HGBA1C 6.5 03/14/2015

## 2015-07-16 NOTE — Assessment & Plan Note (Signed)
Physical today 07/11/15.  Declines mammogram and colon evaluation.

## 2015-07-16 NOTE — Assessment & Plan Note (Signed)
Last platelet count stable at 148.  Follow.

## 2015-07-26 ENCOUNTER — Other Ambulatory Visit (INDEPENDENT_AMBULATORY_CARE_PROVIDER_SITE_OTHER): Payer: Medicare Other

## 2015-07-26 DIAGNOSIS — E1140 Type 2 diabetes mellitus with diabetic neuropathy, unspecified: Secondary | ICD-10-CM | POA: Diagnosis not present

## 2015-07-26 DIAGNOSIS — I10 Essential (primary) hypertension: Secondary | ICD-10-CM

## 2015-07-26 DIAGNOSIS — D696 Thrombocytopenia, unspecified: Secondary | ICD-10-CM

## 2015-07-26 DIAGNOSIS — E7800 Pure hypercholesterolemia, unspecified: Secondary | ICD-10-CM | POA: Diagnosis not present

## 2015-07-26 LAB — CBC WITH DIFFERENTIAL/PLATELET
Basophils Absolute: 0 10*3/uL (ref 0.0–0.1)
Basophils Relative: 0.5 % (ref 0.0–3.0)
Eosinophils Absolute: 0.2 10*3/uL (ref 0.0–0.7)
Eosinophils Relative: 2.4 % (ref 0.0–5.0)
HCT: 43.8 % (ref 36.0–46.0)
Hemoglobin: 14.5 g/dL (ref 12.0–15.0)
Lymphocytes Relative: 29.2 % (ref 12.0–46.0)
Lymphs Abs: 2 10*3/uL (ref 0.7–4.0)
MCHC: 33.1 g/dL (ref 30.0–36.0)
MCV: 93.6 fl (ref 78.0–100.0)
Monocytes Absolute: 0.6 10*3/uL (ref 0.1–1.0)
Monocytes Relative: 9.3 % (ref 3.0–12.0)
Neutro Abs: 4 10*3/uL (ref 1.4–7.7)
Neutrophils Relative %: 58.6 % (ref 43.0–77.0)
Platelets: 153 10*3/uL (ref 150.0–400.0)
RBC: 4.67 Mil/uL (ref 3.87–5.11)
RDW: 12.7 % (ref 11.5–15.5)
WBC: 6.8 10*3/uL (ref 4.0–10.5)

## 2015-07-26 LAB — BASIC METABOLIC PANEL
BUN: 15 mg/dL (ref 6–23)
CO2: 30 mEq/L (ref 19–32)
Calcium: 9.7 mg/dL (ref 8.4–10.5)
Chloride: 102 mEq/L (ref 96–112)
Creatinine, Ser: 0.59 mg/dL (ref 0.40–1.20)
GFR: 101.24 mL/min (ref >60.00)
Glucose, Bld: 150 mg/dL — ABNORMAL HIGH (ref 70–99)
Potassium: 4.4 mEq/L (ref 3.5–5.1)
Sodium: 140 mEq/L (ref 135–145)

## 2015-07-26 LAB — LIPID PANEL
Cholesterol: 181 mg/dL (ref 0–200)
HDL: 46.5 mg/dL (ref >39.00)
LDL Cholesterol: 105 mg/dL — ABNORMAL HIGH (ref 0–99)
NonHDL: 134.05
Total CHOL/HDL Ratio: 4
Triglycerides: 144 mg/dL (ref 0.0–149.0)
VLDL: 28.8 mg/dL (ref 0.0–40.0)

## 2015-07-26 LAB — HEPATIC FUNCTION PANEL
ALT: 11 U/L (ref 0–35)
AST: 17 U/L (ref 0–37)
Albumin: 4.2 g/dL (ref 3.5–5.2)
Alkaline Phosphatase: 68 U/L (ref 39–117)
Bilirubin, Direct: 0.2 mg/dL (ref 0.0–0.3)
Total Bilirubin: 0.9 mg/dL (ref 0.2–1.2)
Total Protein: 7.2 g/dL (ref 6.0–8.3)

## 2015-07-26 LAB — HEMOGLOBIN A1C: Hgb A1c MFr Bld: 6.6 % — ABNORMAL HIGH (ref 4.6–6.5)

## 2015-07-27 ENCOUNTER — Encounter: Payer: Self-pay | Admitting: Internal Medicine

## 2015-07-30 ENCOUNTER — Other Ambulatory Visit: Payer: Self-pay | Admitting: Surgery

## 2015-07-30 ENCOUNTER — Encounter: Payer: Medicare Other | Attending: Surgery | Admitting: Surgery

## 2015-07-30 ENCOUNTER — Ambulatory Visit
Admission: RE | Admit: 2015-07-30 | Discharge: 2015-07-30 | Disposition: A | Discharge status: Home / Self Care | Payer: Medicare Other | Source: Ambulatory Visit | Attending: Surgery | Admitting: Surgery

## 2015-07-30 DIAGNOSIS — E1140 Type 2 diabetes mellitus with diabetic neuropathy, unspecified: Secondary | ICD-10-CM | POA: Diagnosis not present

## 2015-07-30 DIAGNOSIS — I10 Essential (primary) hypertension: Secondary | ICD-10-CM | POA: Insufficient documentation

## 2015-07-30 DIAGNOSIS — L97522 Non-pressure chronic ulcer of other part of left foot with fat layer exposed: Secondary | ICD-10-CM | POA: Insufficient documentation

## 2015-07-30 DIAGNOSIS — L84 Corns and callosities: Secondary | ICD-10-CM | POA: Diagnosis not present

## 2015-07-30 DIAGNOSIS — M8610 Other acute osteomyelitis, unspecified site: Secondary | ICD-10-CM

## 2015-07-30 DIAGNOSIS — M8580 Other specified disorders of bone density and structure, unspecified site: Secondary | ICD-10-CM | POA: Insufficient documentation

## 2015-07-30 DIAGNOSIS — E11621 Type 2 diabetes mellitus with foot ulcer: Secondary | ICD-10-CM | POA: Diagnosis not present

## 2015-07-30 DIAGNOSIS — M868X7 Other osteomyelitis, ankle and foot: Secondary | ICD-10-CM | POA: Insufficient documentation

## 2015-07-30 DIAGNOSIS — S91302A Unspecified open wound, left foot, initial encounter: Secondary | ICD-10-CM | POA: Diagnosis not present

## 2015-07-30 DIAGNOSIS — Z8673 Personal history of transient ischemic attack (TIA), and cerebral infarction without residual deficits: Secondary | ICD-10-CM | POA: Diagnosis not present

## 2015-07-31 NOTE — Progress Notes (Signed)
[name], [name] ([id number]) Visit Report for 07/30/2015 Chief Complaint Document Details Patient Name: Angela, Patterson. Date of Service: 07/30/2015 1:30 PM Medical Record Patient Account Number: [id number] Number: Afful, RN, BSN, Treating RN: 06/10/1921 [phone] y.o. Velva Harman Date of Birth/Sex: Female) Other Clinician: Primary Care Physician: Einar Pheasant Treating Christin Fudge Referring Physician: Einar Pheasant Physician/Extender: Suella Grove in Treatment: 0 Information Obtained from: Patient Chief Complaint Patient presents to the wound care center for a consult due non healing wound Pleasant 82 year old comes along with her daughter with a recurrent ulceration and callosity on her left foot. she has had this for about 3 weeks now. Electronic Signature(s) Signed: 07/30/2015 2:33:46 PM By: Christin Fudge MD, FACS Entered By: Christin Fudge on 07/30/2015 14:33:46 Angela Patterson ([id number]) -------------------------------------------------------------------------------- Debridement Details Patient Name: Angela Patterson. Date of Service: 07/30/2015 1:30 PM Medical Record Patient Account Number: [id number] Number: Afful, RN, BSN, Treating RN: 09-27-1920 [phone] y.o. Velva Harman Date of Birth/Sex: Female) Other Clinician: Primary Care Physician: Einar Pheasant Treating Marlena Barbato Referring Physician: Einar Pheasant Physician/Extender: Suella Grove in Treatment: 0 Debridement Performed for Wound #3 Left,Plantar Metatarsal head fifth Assessment: Performed By: Physician Christin Fudge, MD Debridement: Debridement Pre-procedure Yes Verification/Time Out Taken: Start Time: 14:16 Pain Control: Lidocaine 4% Topical Solution Level: Skin/Subcutaneous Tissue Total Area Debrided (L x 2 (cm) x 3 (cm) = 6 (cm) W): Tissue and other Viable, Non-Viable, Callus, Eschar, Exudate, Fibrin/Slough, Skin, material debrided: Subcutaneous Instrument: Forceps, Scissors Bleeding: Large Hemostasis  Achieved: Silver Nitrate End Time: 14:29 Procedural Pain: 0 Post Procedural Pain: 0 Response to Treatment: Procedure was tolerated well Post Debridement Measurements of Total Wound Length: (cm) 2 Width: (cm) 3 Depth: (cm) 0.2 Volume: (cm) 0.942 Post Procedure Diagnosis Same as Pre-procedure Electronic Signature(s) Signed: 07/30/2015 2:33:04 PM By: Christin Fudge MD, FACS Signed: 07/30/2015 4:56:50 PM By: Regan Lemming BSN, RN Entered By: Christin Fudge on 07/30/2015 14:33:04 Angela Patterson ([id number]) -------------------------------------------------------------------------------- HPI Details Patient Name: Angela Patterson. Date of Service: 07/30/2015 1:30 PM Medical Record Patient Account Number: [id number] Number: Afful, RN, BSN, Treating RN: 09-11-1920 [phone] y.o. Velva Harman Date of Birth/Sex: Female) Other Clinician: Primary Care Physician: Einar Pheasant Treating Christin Fudge Referring Physician: Einar Pheasant Physician/Extender: Weeks in Treatment: 0 History of Present Illness Location: left foot Quality: mild pain occasionally. Severity: the problem is been getting worse for the last 3 weeks Duration: she's had this for almost a year now on and off. Timing: pain is intermittent and occasional. Context: she was seen by me earlier in March and again in May and was healed by early June 2016 Modifying Factors: she has had diabetic shoes made and is compliant with wearing these. Associated Signs and Symptoms: worse on standing on her feet for too long. HPI Description: this very pleasant 82 year old patient who is extremely active and ambulating all day has had problems with the left foot for over a year. Her daughter who is her caregiver says she drives around and even goes around and does her chores by herself. this is the third recurrence and after the last time we saw in June and discharged her her daughter has got her to pace of diabetic shoes and she's been wearing  these regularly. She has been on the shoes for about 6 months now. Her diabetes is under control and her blood sugars run from a anywhere between 110 to 130 and she checks them twice a day. no fever or discharge. During her initial visit, an x-ray of her left foot  was done on 09/26/2014. There was no cortical erosion noted there was mild osteopenia in the distal first metatarsal. Radiologist has recommended a MRI of the left foot to exclude osteomyelitis. MRI done on 10/09/2014 reveals that the area where she had a phlegmon is without osteomyelitis. the bone marrow and other areas are compatible with reactive edema rather than osteomyelitis. Electronic Signature(s) Signed: 07/30/2015 2:37:09 PM By: Christin Fudge MD, FACS Entered By: Christin Fudge on 07/30/2015 14:37:08 Angela Patterson ([id number]) -------------------------------------------------------------------------------- Physical Exam Details Patient Name: Angela Patterson. Date of Service: 07/30/2015 1:30 PM Medical Record Patient Account Number: [id number] Number: Afful, RN, BSN, Treating RN: 1920-08-21 [phone] y.o. Velva Harman Date of Birth/Sex: Female) Other Clinician: Primary Care Physician: Einar Pheasant Treating Christin Fudge Referring Physician: Einar Pheasant Physician/Extender: Weeks in Treatment: 0 Constitutional . Pulse regular. Respirations normal and unlabored. Afebrile. . Eyes Nonicteric. Reactive to light. Ears, Nose, Mouth, and Throat Lips, teeth, and gums WNL.Marland Kitchen Moist mucosa without lesions. Neck supple and nontender. No palpable supraclavicular or cervical adenopathy. Normal sized without goiter. Respiratory WNL. No retractions.. Cardiovascular Pedal Pulses WNL. ABI on the left is 0.92 in the right is 0.95. No clubbing, cyanosis or edema. Lymphatic No adneopathy. No adenopathy. No adenopathy. Musculoskeletal Adexa without tenderness or enlargement.. Digits and nails w/o clubbing, cyanosis, infection,  petechiae, ischemia, or inflammatory conditions.. Integumentary (Hair, Skin) No suspicious lesions. No crepitus or fluctuance. No peri-wound warmth or erythema. No masses.Marland Kitchen Psychiatric Judgement and insight Intact.. No evidence of depression, anxiety, or agitation.. Notes she has a large callus buildup on the plantar aspect of her left forefoot in the region of the first metatarsal head. This is rather large and necrotic and will need sharp debridement and this has been done with forceps and scissors down to the subcutis tissue where there has been brisk bleeding which is controlled with Silver nitrate sticks. Electronic Signature(s) Signed: 07/30/2015 2:38:00 PM By: Christin Fudge MD, FACS Entered By: Christin Fudge on 07/30/2015 14:37:59 Angela Patterson ([id number]) -------------------------------------------------------------------------------- Physician Orders Details Patient Name: Angela Patterson. Date of Service: 07/30/2015 1:30 PM Medical Record Patient Account Number: [id number] Number: Afful, RN, BSN, Treating RN: Oct 10, 1920 [phone] y.o. Velva Harman Date of Birth/Sex: Female) Other Clinician: Primary Care Physician: Einar Pheasant Treating Christin Fudge Referring Physician: Einar Pheasant Physician/Extender: Suella Grove in Treatment: 0 Verbal / Phone Orders: Yes Clinician: Afful, RN, BSN, Rita Read Back and Verified: Yes Diagnosis Coding Wound Cleansing Wound #3 Left,Plantar Metatarsal head fifth o Cleanse wound with mild soap and water o May Shower, gently pat wound dry prior to applying new dressing. Anesthetic Wound #3 Left,Plantar Metatarsal head fifth o Topical Lidocaine 4% cream applied to wound bed prior to debridement Primary Wound Dressing Wound #3 Left,Plantar Metatarsal head fifth o Aquacel Ag Secondary Dressing Wound #3 Left,Plantar Metatarsal head fifth o Boardered Foam Dressing Dressing Change Frequency Wound #3 Left,Plantar Metatarsal head  fifth o Change dressing every other day. Follow-up Appointments Wound #3 Left,Plantar Metatarsal head fifth o Return Appointment in 1 week. Off-Loading Wound #3 Left,Plantar Metatarsal head fifth o Open toe surgical shoe to: - front offloader darco Additional Orders / Instructions Wound #3 Left,Plantar Metatarsal head fifth o Increase protein intake. o Activity as tolerated Angela, Patterson. ([id number]) o Other: - Keep blood sugars under control Radiology o X-ray, foot - 3 plus view of left foot. Electronic Signature(s) Signed: 07/30/2015 4:32:01 PM By: Christin Fudge MD, FACS Signed: 07/30/2015 4:56:50 PM By: Regan Lemming BSN, RN Entered By: Regan Lemming on 07/30/2015  14:32:50 Angela, Patterson ([id number]) -------------------------------------------------------------------------------- Problem List Details Patient Name: Angela, Patterson. Date of Service: 07/30/2015 1:30 PM Medical Record Patient Account Number: [id number] Number: Afful, RN, BSN, Treating RN: 1921-01-28 [phone] y.o. Velva Harman Date of Birth/Sex: Female) Other Clinician: Primary Care Physician: Einar Pheasant Treating Christin Fudge Referring Physician: Einar Pheasant Physician/Extender: Suella Grove in Treatment: 0 Active Problems ICD-10 Encounter Code Description Active Date Diagnosis E11.621 Type 2 diabetes mellitus with foot ulcer 07/30/2015 Yes L97.522 Non-pressure chronic ulcer of other part of left foot with fat 07/30/2015 Yes layer exposed L84 Corns and callosities 07/30/2015 Yes Inactive Problems Resolved Problems Electronic Signature(s) Signed: 07/30/2015 2:32:43 PM By: Christin Fudge MD, FACS Entered By: Christin Fudge on 07/30/2015 14:32:43 Angela Patterson ([id number]) -------------------------------------------------------------------------------- Progress Note Details Patient Name: Angela Patterson. Date of Service: 07/30/2015 1:30 PM Medical Record Patient Account Number:  [id number] Number: Afful, RN, BSN, Treating RN: 01-29-1921 [phone] y.o. Velva Harman Date of Birth/Sex: Female) Other Clinician: Primary Care Physician: Einar Pheasant Treating Christin Fudge Referring Physician: Einar Pheasant Physician/Extender: Suella Grove in Treatment: 0 Subjective Chief Complaint Information obtained from Patient Patient presents to the wound care center for a consult due non healing wound Pleasant 82 year old comes along with her daughter with a recurrent ulceration and callosity on her left foot. she has had this for about 3 weeks now. History of Present Illness (HPI) The following HPI elements were documented for the patient's wound: Location: left foot Quality: mild pain occasionally. Severity: the problem is been getting worse for the last 3 weeks Duration: she's had this for almost a year now on and off. Timing: pain is intermittent and occasional. Context: she was seen by me earlier in March and again in May and was healed by early June 2016 Modifying Factors: she has had diabetic shoes made and is compliant with wearing these. Associated Signs and Symptoms: worse on standing on her feet for too long. this very pleasant 82 year old patient who is extremely active and ambulating all day has had problems with the left foot for over a year. Her daughter who is her caregiver says she drives around and even goes around and does her chores by herself. this is the third recurrence and after the last time we saw in June and discharged her her daughter has got her to pace of diabetic shoes and she's been wearing these regularly. She has been on the shoes for about 6 months now. Her diabetes is under control and her blood sugars run from a anywhere between 110 to 130 and she checks them twice a day. no fever or discharge. During her initial visit, an x-ray of her left foot was done on 09/26/2014. There was no cortical erosion noted there was mild osteopenia in the  distal first metatarsal. Radiologist has recommended a MRI of the left foot to exclude osteomyelitis. MRI done on 10/09/2014 reveals that the area where she had a phlegmon is without osteomyelitis. the bone marrow and other areas are compatible with reactive edema rather than osteomyelitis. Wound History Patient presents with 1 open wound that has been present for approximately 66month. Patient has been Angela, Patterson. ([id number]) treating wound in the following manner: dry dressing, neosporin. The wound has been healed in the past but has re-opened. Laboratory tests have been performed in the last month. Patient reportedly has not tested positive for an antibiotic resistant organism. Patient reportedly has not tested positive for osteomyelitis. Patient reportedly has not had testing performed to evaluate circulation in the legs.  Patient History Information obtained from Patient. Allergies Iodinated Contrast Media - IV Dye Family History Cancer - Father, Diabetes - Siblings, Stroke - Mother, No family history of Heart Disease, Hereditary Spherocytosis, Hypertension, Kidney Disease, Lung Disease, Seizures, Thyroid Problems, Tuberculosis. Social History Never smoker, Marital Status - Widowed, Alcohol Use - Never, Drug Use - No History, Caffeine Use - Daily. Medical History Oncologic Denies history of Received Chemotherapy, Received Radiation Medical And Surgical History Notes Cardiovascular TIA Neurologic TIA Review of Systems (ROS) Eyes The patient has no complaints or symptoms. Ear/Nose/Mouth/Throat The patient has no complaints or symptoms. Respiratory The patient has no complaints or symptoms. Cardiovascular The patient has no complaints or symptoms. Gastrointestinal The patient has no complaints or symptoms. Endocrine The patient has no complaints or symptoms. Genitourinary The patient has no complaints or symptoms. Immunological The patient has no complaints or  symptoms. Integumentary (Skin) Complains or has symptoms of Wounds, Breakdown. Neurologic The patient has no complaints or symptoms. Angela, Patterson ([id number]) Oncologic The patient has no complaints or symptoms. Objective Constitutional Pulse regular. Respirations normal and unlabored. Afebrile. Vitals Time Taken: 1:32 PM, Height: 65 in, Source: Stated, Weight: 107 lbs, Source: Measured, BMI: 17.8, Pulse: 68 bpm, Respiratory Rate: 18 breaths/min, Blood Pressure: 172/53 mmHg. Eyes Nonicteric. Reactive to light. Ears, Nose, Mouth, and Throat Lips, teeth, and gums WNL.Marland Kitchen Moist mucosa without lesions. Neck supple and nontender. No palpable supraclavicular or cervical adenopathy. Normal sized without goiter. Respiratory WNL. No retractions.. Cardiovascular Pedal Pulses WNL. ABI on the left is 0.92 in the right is 0.95. No clubbing, cyanosis or edema. Lymphatic No adneopathy. No adenopathy. No adenopathy. Musculoskeletal Adexa without tenderness or enlargement.. Digits and nails w/o clubbing, cyanosis, infection, petechiae, ischemia, or inflammatory conditions.Marland Kitchen Psychiatric Judgement and insight Intact.. No evidence of depression, anxiety, or agitation.. General Notes: she has a large callus buildup on the plantar aspect of her left forefoot in the region of the first metatarsal head. This is rather large and necrotic and will need sharp debridement and this has been done with forceps and scissors down to the subcutis tissue where there has been brisk bleeding which is controlled with Silver nitrate sticks. Integumentary (Hair, Skin) No suspicious lesions. No crepitus or fluctuance. No peri-wound warmth or erythema. No masses.Marland Kitchen Angela, Patterson ([id number]) Wound #3 status is Open. Original cause of wound was Gradually Appeared. The wound is located on the Left,Plantar Metatarsal head fifth. The wound measures 2cm length x 3cm width x 0.2cm depth; 4.712cm^2 area and 0.942cm^3 volume.  The wound is limited to skin breakdown. There is no tunneling or undermining noted. There is a small amount of serous drainage noted. The wound margin is distinct with the outline attached to the wound base. There is no granulation within the wound bed. There is a large (67-100%) amount of necrotic tissue within the wound bed including Eschar. The periwound skin appearance exhibited: Callus, Dry/Scaly, Moist. The periwound skin appearance did not exhibit: Crepitus, Excoriation, Fluctuance, Friable, Induration, Localized Edema, Rash, Scarring, Maceration, Atrophie Blanche, Cyanosis, Ecchymosis, Hemosiderin Staining, Mottled, Pallor, Rubor, Erythema. Periwound temperature was noted as No Abnormality. Assessment Active Problems ICD-10 E11.621 - Type 2 diabetes mellitus with foot ulcer L97.522 - Non-pressure chronic ulcer of other part of left foot with fat layer exposed L84 - Corns and callosities This very pleasant 82 year old patient was known to me from 2 previous visits now comes with her third recurrence of 4 large callosity in the left forefoot in the region of her first metatarsal  head. This time around she has been compliant with wearing the diabetic shoes and in spite of this she has developed this. And distend she has a podiatry appointment pending and I would like to speak to the podiatrist about her care. After sharply debriding the wound I have recommended silver alginate with a offloading felt ring around it. She will also use a Darco front off loading shoe. She will come back to see me every week. Procedures Wound #3 Wound #3 is a Diabetic Wound/Ulcer of the Lower Extremity located on the Left,Plantar Metatarsal head fifth . There was a Skin/Subcutaneous Tissue Debridement [id number]) debridement with total area of 6 sq cm performed by Christin Fudge, MD. with the following instrument(s): Forceps and Scissors to remove Viable and Non-Viable tissue/material including Exudate,  Fibrin/Slough, Eschar, Skin, Callus, and Subcutaneous after achieving pain control using Lidocaine 4% Topical Solution. A time out was conducted prior to the start of the procedure. A Large amount of bleeding was controlled with Silver Nitrate. The procedure was tolerated well with a pain level of 0 throughout and a pain level of 0 following the procedure. Angela, Patterson ([id number]) Post Debridement Measurements: 2cm length x 3cm width x 0.2cm depth; 0.942cm^3 volume. Post procedure Diagnosis Wound #3: Same as Pre-Procedure Plan Wound Cleansing: Wound #3 Left,Plantar Metatarsal head fifth: Cleanse wound with mild soap and water May Shower, gently pat wound dry prior to applying new dressing. Anesthetic: Wound #3 Left,Plantar Metatarsal head fifth: Topical Lidocaine 4% cream applied to wound bed prior to debridement Primary Wound Dressing: Wound #3 Left,Plantar Metatarsal head fifth: Aquacel Ag Secondary Dressing: Wound #3 Left,Plantar Metatarsal head fifth: Boardered Foam Dressing Dressing Change Frequency: Wound #3 Left,Plantar Metatarsal head fifth: Change dressing every other day. Follow-up Appointments: Wound #3 Left,Plantar Metatarsal head fifth: Return Appointment in 1 week. Off-Loading: Wound #3 Left,Plantar Metatarsal head fifth: Open toe surgical shoe to: - front offloader darco Additional Orders / Instructions: Wound #3 Left,Plantar Metatarsal head fifth: Increase protein intake. Activity as tolerated Other: - Keep blood sugars under control Radiology ordered were: X-ray, foot - 3 plus view of left foot. This very pleasant 82 year old patient was known to me from 2 previous visits now comes with her third recurrence of 4 large callosity in the left forefoot in the region of her first metatarsal head. Angela, Patterson ([id number]) This time around she has been compliant with wearing the diabetic shoes and in spite of this she has developed this. And distend she has a  podiatry appointment pending and I would like to speak to the podiatrist about her care. After sharply debriding the wound I have recommended silver alginate with a offloading felt ring around it. She will also use a Darco front off loading shoe. She will come back to see me every week. Electronic Signature(s) Signed: 07/30/2015 2:39:30 PM By: Christin Fudge MD, FACS Entered By: Christin Fudge on 07/30/2015 14:39:29 Angela Patterson ([id number]) -------------------------------------------------------------------------------- ROS/PFSH Details Patient Name: Angela Patterson. Date of Service: 07/30/2015 1:30 PM Medical Record Patient Account Number: [id number] Number: Afful, RN, BSN, Treating RN: [birth year] [phone] y.o. Velva Harman Date of Birth/Sex: Female) Other Clinician: Primary Care Physician: Einar Pheasant Treating Christin Fudge Referring Physician: Einar Pheasant Physician/Extender: Suella Grove in Treatment: 0 Information Obtained From Patient Wound History Do you currently have one or more open woundso Yes How many open wounds do you currently haveo 1 Approximately how long have you had your woundso 46month How have you been treating your wound(s) until nowo dry  dressing, neosporin Has your wound(s) ever healed and then re-openedo Yes Have you tested positive for an antibiotic resistant organism (MRSA, VRE)o No Have you tested positive for osteomyelitis (bone infection)o No Have you had any tests for circulation on your legso No Integumentary (Skin) Complaints and Symptoms: Positive for: Wounds; Breakdown Medical History: Positive for: History of pressure wounds Negative for: History of Burn Eyes Complaints and Symptoms: No Complaints or Symptoms Medical History: Positive for: Cataracts - removed "a few years ago" (B ); Glaucoma Ear/Nose/Mouth/Throat Complaints and Symptoms: No Complaints or Symptoms Medical History: Negative for: Chronic sinus problems/congestion; Middle ear  problems Hematologic/Lymphatic HADIYA, HALTIWANGER ([id number]) Medical History: Negative for: Anemia; Hemophilia; Human Immunodeficiency Virus; Lymphedema; Sickle Cell Disease Respiratory Complaints and Symptoms: No Complaints or Symptoms Medical History: Negative for: Aspiration; Asthma; Chronic Obstructive Pulmonary Disease (COPD); Pneumothorax; Sleep Apnea; Tuberculosis Cardiovascular Complaints and Symptoms: No Complaints or Symptoms Medical History: Positive for: Hypertension Past Medical History Notes: TIA Gastrointestinal Complaints and Symptoms: No Complaints or Symptoms Medical History: Negative for: Cirrhosis ; Crohnos; Hepatitis A; Hepatitis B; Hepatitis C Endocrine Complaints and Symptoms: No Complaints or Symptoms Medical History: Positive for: Type II Diabetes Time with diabetes: 2 years Treated with: Oral agents Blood sugar testing results: Breakfast: 120 Genitourinary Complaints and Symptoms: No Complaints or Symptoms Medical History: Negative for: End Stage Renal Disease Angela, Patterson ([id number]) Immunological Complaints and Symptoms: No Complaints or Symptoms Medical History: Negative for: Lupus Erythematosus; Raynaudos; Scleroderma Musculoskeletal Medical History: Negative for: Gout; Rheumatoid Arthritis; Osteoarthritis; Osteomyelitis Neurologic Complaints and Symptoms: No Complaints or Symptoms Medical History: Positive for: Neuropathy Past Medical History Notes: TIA Oncologic Complaints and Symptoms: No Complaints or Symptoms Medical History: Negative for: Received Chemotherapy; Received Radiation HBO Extended History Items Eyes: Eyes: Cataracts Glaucoma Family and Social History Cancer: Yes - Father; Diabetes: Yes - Siblings; Heart Disease: No; Hereditary Spherocytosis: No; Hypertension: No; Kidney Disease: No; Lung Disease: No; Seizures: No; Stroke: Yes - Mother; Thyroid Problems: No; Tuberculosis: No; Never smoker; Marital Status -  Widowed; Alcohol Use: Never; Drug Use: No History; Caffeine Use: Daily; Financial Concerns: No; Food, Clothing or Shelter Needs: No; Support System Lacking: No; Transportation Concerns: No; Advanced Directives: Yes (Not Provided); Patient does not want information on Advanced Directives; Do not resuscitate: No; Living Will: Yes (Copy provided); Medical Power of Attorney: Yes - Angela Patterson- daughter (Copy provided) Physician Affirmation I have reviewed and agree with the above information. Electronic Signature(s) Signed: 07/30/2015 2:31:53 PM By: Christin Fudge MD, FACS Signed: 07/30/2015 4:56:50 PM By: Regan Lemming BSN, RN Entered By: Christin Fudge on 07/30/2015 14:31:51 Angela Patterson ([id number], Patterson ([id number]) -------------------------------------------------------------------------------- SuperBill Details Patient Name: Angela Patterson. Date of Service: 07/30/2015 Medical Record Patient Account Number: [id number] Number: Afful, RN, BSN, Treating RN: 07-Sep-1920 [phone] y.o. 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 L84 Corns and callosities Facility Procedures CPT4 Code Description: [id number] - WOUND CARE VISIT-LEV 3 EST PT Modifier: Quantity: 1 CPT4 Code Description: [id number] - DEB SUBQ TISSUE 20 SQ CM/< ICD-10 Description Diagnosis E11.621 Type 2 diabetes mellitus with 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 L84 Corns and callosities Modifier: with fat laye Quantity: 1 r exposed Physician  Procedures CPT4 Code Description: [id number] - WC PHYS LEVEL 4 - EST PT ICD-10 Description  Diagnosis E11.621 Type 2 diabetes mellitus with 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 L84 Corns and callosities Modifier: with fat layer Quantity: 1 exposed CPT4 Code Description: [id number] - WC PHYS SUBQ TISS 20 SQ CM ICD-10 Description Diagnosis E11.621 Type 2 diabetes mellitus with 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 L84 Corns and callosities ARISBEL, KEITA  ([id number]) Modifier: with fat layer Quantity: 1 exposed Electronic Signature(s) Signed: 07/30/2015 4:32:01 PM By: Christin Fudge MD, FACS Signed: 07/30/2015 4:56:50 PM By: Regan Lemming BSN, RN Previous Signature: 07/30/2015 2:39:48 PM Version By: Christin Fudge MD, FACS Entered By: Regan Lemming on 07/30/2015 14:55:49

## 2015-07-31 NOTE — Progress Notes (Signed)
Angela Patterson ([id number]) Visit Report for 07/30/2015 Abuse/Suicide Risk Screen Details Patient Name: Angela Patterson, Angela Patterson. Date of Service: 07/30/2015 1:30 PM Medical Record Patient Account Number: [id number] Number: Afful, RN, BSN, Treating RN: [birth year] [phone] y.o. Velva Harman Date of Birth/Sex: Female) Other Clinician: Primary Care Physician: Einar Pheasant Treating Britto, Errol Referring Physician: Einar Pheasant Physician/Extender: Weeks in Treatment: 0 Abuse/Suicide Risk Screen Items Answer ABUSE/SUICIDE RISK SCREEN: Has anyone close to you tried to hurt or harm you recentlyo No Do you feel uncomfortable with anyone in your familyo No Has anyone forced you do things that you didnot want to doo No Do you have any thoughts of harming yourselfo No Patient displays signs or symptoms of abuse and/or neglect. No Electronic Signature(s) Signed: 07/30/2015 4:56:50 PM By: Regan Lemming BSN, RN Entered By: Regan Lemming on 07/30/2015 13:38:12 Angela Patterson ([id number]) -------------------------------------------------------------------------------- Activities of Daily Living Details Patient Name: Angela Patterson. Date of Service: 07/30/2015 1:30 PM Medical Record Patient Account Number: [id number] Number: Afful, RN, BSN, Treating RN: 12-01-1920 [phone] y.o. Velva Harman Date of Birth/Sex: Female) Other Clinician: Primary Care Physician: Einar Pheasant Treating Christin Fudge Referring Physician: Einar Pheasant Physician/Extender: Suella Grove in Treatment: 0 Activities of Daily Living Items Answer Activities of Daily Living (Please select one for each item) Drive Automobile Need Assistance Take Medications Need Assistance Use Telephone Need Assistance Care for Appearance Need Assistance Use Toilet Need Assistance Bath / Shower Need Assistance Dress Self Need Assistance Feed Self Completely Able Walk Completely Able Get In / Out Bed Completely Lester Need Assistance Shop for Self Need Assistance Electronic Signature(s) Signed: 07/30/2015 4:56:50 PM By: Regan Lemming BSN, RN Entered By: Regan Lemming on 07/30/2015 13:38:40 Angela Patterson ([id number]) -------------------------------------------------------------------------------- Education Assessment Details Patient Name: Angela Patterson. Date of Service: 07/30/2015 1:30 PM Medical Record Patient Account Number: [id number] Number: Afful, RN, BSN, Treating RN: 03/02/1921 [phone] y.o. Velva Harman Date of Birth/Sex: Female) Other Clinician: Primary Care Physician: Einar Pheasant Treating Christin Fudge Referring Physician: Einar Pheasant Physician/Extender: Suella Grove in Treatment: 0 Primary Learner Assessed: Patient Learning Preferences/Education Level/Primary Language Learning Preference: Explanation Highest Education Level: College or Above Preferred Language: English Cognitive Barrier Assessment/Beliefs Language Barrier: No Physical Barrier Assessment Impaired Vision: No Impaired Hearing: No Decreased Hand dexterity: No Knowledge/Comprehension Assessment Knowledge Level: Low Comprehension Level: Low Ability to understand written Low instructions: Ability to understand verbal Low instructions: Motivation Assessment Anxiety Level: Calm Cooperation: Cooperative Education Importance: Acknowledges Need Interest in Health Problems: Asks Questions Perception: Coherent Willingness to Engage in Self- Low Management Activities: Readiness to Engage in Self- Low Management Activities: Electronic Signature(s) Signed: 07/30/2015 4:56:50 PM By: Regan Lemming BSN, RN Entered By: Regan Lemming on 07/30/2015 13:39:11 Angela Patterson ([id number], BROOKING ([id number]) -------------------------------------------------------------------------------- Fall Risk Assessment Details Patient Name: Angela Patterson. Date of Service: 07/30/2015 1:30 PM Medical  Record Patient Account Number: [id number] Number: Afful, RN, BSN, Treating RN: June 01, 1921 [phone] y.o. Velva Harman Date of Birth/Sex: Female) Other Clinician: Primary Care Physician: Einar Pheasant Treating Britto, Errol Referring Physician: Einar Pheasant Physician/Extender: Suella Grove in Treatment: 0 Fall Risk Assessment Items Have you had 2 or more falls in the last 12 monthso 0 No Have you had any fall that resulted in injury in the last 12 monthso 0 No FALL RISK ASSESSMENT: History of falling - immediate or within 3 months 0 No Secondary diagnosis 0 No Ambulatory aid None/bed rest/wheelchair/nurse 0 Yes Crutches/cane/walker 0 No Furniture 0 No  IV Access/Saline Lock 0 No Gait/Training Normal/bed rest/immobile 0 Yes Weak 0 No Impaired 0 No Mental Status Oriented to own ability 0 Yes Electronic Signature(s) Signed: 07/30/2015 4:56:50 PM By: Regan Lemming BSN, RN Entered By: Regan Lemming on 07/30/2015 13:40:28 Angela Patterson ([id number]) -------------------------------------------------------------------------------- Foot Assessment Details Patient Name: Angela Patterson. Date of Service: 07/30/2015 1:30 PM Medical Record Patient Account Number: [id number] Number: Afful, RN, BSN, Treating RN: 07-23-1920 [phone] y.o. Velva Harman Date of Birth/Sex: Female) Other Clinician: Primary Care Physician: Einar Pheasant Treating Britto, Errol Referring Physician: Einar Pheasant Physician/Extender: Suella Grove in Treatment: 0 Foot Assessment Items Site Locations + = Sensation present, - = Sensation absent, C = Callus, U = Ulcer R = Redness, W = Warmth, M = Maceration, PU = Pre-ulcerative lesion F = Fissure, S = Swelling, D = Dryness Assessment Right: Left: Other Deformity: No No Prior Foot Ulcer: No No Prior Amputation: No No Charcot Joint: No No Ambulatory Status: Ambulatory Without Help Gait: Steady Electronic Signature(s) Signed: 07/30/2015 4:56:50 PM By: Regan Lemming BSN,  RN Entered By: Regan Lemming on 07/30/2015 13:40:43 Angela Patterson ([id number]) -------------------------------------------------------------------------------- Nutrition Risk Assessment Details Patient Name: Angela Patterson. Date of Service: 07/30/2015 1:30 PM Medical Record Patient Account Number: [id number] Number: Afful, RN, BSN, Treating RN: 12/19/1920 [phone] y.o. Velva Harman Date of Birth/Sex: Female) Other Clinician: Primary Care Physician: Einar Pheasant Treating Britto, Errol Referring Physician: Einar Pheasant Physician/Extender: Suella Grove in Treatment: 0 Height (in): 65 Weight (lbs): 107 Body Mass Index (BMI): 17.8 Nutrition Risk Assessment Items NUTRITION RISK SCREEN: I have an illness or condition that made me change the kind and/or 0 No amount of food I eat I eat fewer than two meals per day 0 No I eat few fruits and vegetables, or milk products 0 No I have three or more drinks of beer, liquor or wine almost every day 0 No I have tooth or mouth problems that make it hard for me to eat 0 No I don't always have enough money to buy the food I need 0 No I eat alone most of the time 0 No I take three or more different prescribed or over-the-counter drugs a 0 No day Without wanting to, I have lost or gained 10 pounds in the last six 0 No months I am not always physically able to shop, cook and/or feed myself 0 No Nutrition Protocols Good Risk Protocol 0 No interventions needed Moderate Risk Protocol Electronic Signature(s) Signed: 07/30/2015 4:56:50 PM By: Regan Lemming BSN, RN Entered By: Regan Lemming on 07/30/2015 13:40:34

## 2015-07-31 NOTE — Progress Notes (Signed)
Angela, Patterson ([id number]) Visit Report for 07/30/2015 Allergy List Details Patient Name: Angela Patterson, Angela Patterson. Date of Service: 07/30/2015 1:30 PM Medical Record Number: [id number] Patient Account Number: [id number] Date of Birth/Sex: 06-07-1921 (82 y.o. Female) Treating RN: Afful, RN, BSN, Velva Harman Primary Care Physician: Einar Pheasant Other Clinician: Referring Physician: Einar Pheasant Treating Physician/Extender: Frann Rider in Treatment: 0 Allergies Active Allergies Iodinated Contrast Media - IV Dye Allergy Notes Electronic Signature(s) Signed: 07/30/2015 4:56:50 PM By: Regan Lemming BSN, RN Entered By: Regan Lemming on 07/30/2015 13:34:33 Angela Patterson ([id number]) -------------------------------------------------------------------------------- Arrival Information Details Patient Name: Angela Patterson. Date of Service: 07/30/2015 1:30 PM Medical Record Number: [id number] Patient Account Number: [id number] Date of Birth/Sex: Jun 02, 1921 (82 y.o. Female) Treating RN: Afful, RN, BSN, Velva Harman Primary Care Physician: Einar Pheasant Other Clinician: Referring Physician: Einar Pheasant Treating Physician/Extender: Frann Rider in Treatment: 0 Visit Information Patient Arrived: Ambulatory Arrival Time: 13:31 Accompanied By: dtr Transfer Assistance: None Patient Identification Verified: Yes Secondary Verification Process Yes Completed: Patient Requires Transmission-Based No Precautions: Patient Has Alerts: No History Since Last Visit Added or deleted any medications: No Any new allergies or adverse reactions: No Had a fall or experienced change in activities of daily living that may affect risk of falls: No Signs or symptoms of abuse/neglect since last visito No Hospitalized since last visit: No Has Dressing in Place as Prescribed: Yes Electronic Signature(s) Signed: 07/30/2015 4:56:50 PM By: Regan Lemming BSN, RN Entered By: Regan Lemming on 07/30/2015 13:31:38 Angela Patterson ([id number]) -------------------------------------------------------------------------------- Clinic Level of Care Assessment Details Patient Name: Angela Patterson. Date of Service: 07/30/2015 1:30 PM Medical Record Number: [id number] Patient Account Number: [id number] Date of Birth/Sex: 08/14/1920 (82 y.o. Female) Treating RN: Afful, RN, BSN, Velva Harman Primary Care Physician: Einar Pheasant Other Clinician: Referring Physician: Einar Pheasant Treating Physician/Extender: Frann Rider in Treatment: 0 Clinic Level of Care Assessment Items TOOL 1 Quantity Score []  - Use when EandM and Procedure is performed on INITIAL visit 0 ASSESSMENTS - Nursing Assessment / Reassessment X - General Physical Exam (combine w/ comprehensive assessment (listed just 1 20 below) when performed on new pt. evals) X - Comprehensive Assessment (HX, ROS, Risk Assessments, Wounds Hx, etc.) 1 25 ASSESSMENTS - Wound and Skin Assessment / Reassessment []  - Dermatologic / Skin Assessment (not related to wound area) 0 ASSESSMENTS - Ostomy and/or Continence Assessment and Care []  - Incontinence Assessment and Management 0 []  - Ostomy Care Assessment and Management (repouching, etc.) 0 PROCESS - Coordination of Care X - Simple Patient / Family Education for ongoing care 1 15 []  - Complex (extensive) Patient / Family Education for ongoing care 0 X - Staff obtains Programmer, systems, Records, Test Results / Process Orders 1 10 []  - Staff telephones HHA, Nursing Homes / Clarify orders / etc 0 []  - Routine Transfer to another Facility (non-emergent condition) 0 []  - Routine Hospital Admission (non-emergent condition) 0 X - New Admissions / Biomedical engineer / Ordering NPWT, Apligraf, etc. 1 15 []  - Emergency Hospital Admission (emergent condition) 0 PROCESS - Special Needs []  - Pediatric / Minor Patient Management 0 []  - Isolation Patient Management 0 SHERYLE, FESTA ([id number]) []  - Hearing / Language / Visual  special needs 0 []  - Assessment of Community assistance (transportation, D/C planning, etc.) 0 []  - Additional assistance / Altered mentation 0 []  - Support Surface(s) Assessment (bed, cushion, seat, etc.) 0 INTERVENTIONS - Miscellaneous []  - External ear exam 0 []  - Patient Transfer (multiple staff / Harrel Lemon  Lift / Similar devices) 0 []  - Simple Staple / Suture removal (25 or less) 0 []  - Complex Staple / Suture removal (26 or more) 0 []  - Hypo/Hyperglycemic Management (do not check if billed separately) 0 X - Ankle / Brachial Index (ABI) - do not check if billed separately 1 15 Has the patient been seen at the hospital within the last three years: Yes Total Score: 100 Level Of Care: New/Established - Level 3 Electronic Signature(s) Signed: 07/30/2015 4:56:50 PM By: Regan Lemming BSN, RN Entered By: Regan Lemming on 07/30/2015 14:55:09 Angela Patterson ([id number]) -------------------------------------------------------------------------------- Encounter Discharge Information Details Patient Name: Angela Patterson. Date of Service: 07/30/2015 1:30 PM Medical Record Number: [id number] Patient Account Number: [id number] Date of Birth/Sex: 12-Feb-1921 (82 y.o. Female) Treating RN: Afful, RN, BSN, Velva Harman Primary Care Physician: Einar Pheasant Other Clinician: Referring Physician: Einar Pheasant Treating Physician/Extender: Frann Rider in Treatment: 0 Encounter Discharge Information Items Discharge Pain Level: 0 Discharge Condition: Stable Ambulatory Status: Ambulatory Discharge Destination: Home Transportation: Private Auto Accompanied By: dtr Schedule Follow-up Appointment: No Medication Reconciliation completed and provided to Patient/Care No Tupac Jeffus: Provided on Clinical Summary of Care: 07/30/2015 Form Type Recipient Paper Patient EG Electronic Signature(s) Signed: 07/30/2015 4:56:50 PM By: Regan Lemming BSN, RN Previous Signature: 07/30/2015 2:43:37 PM Version By: Ruthine Dose Entered By: Regan Lemming on 07/30/2015 14:47:48 Angela Patterson ([id number]) -------------------------------------------------------------------------------- Lower Extremity Assessment Details Patient Name: Angela Patterson. Date of Service: 07/30/2015 1:30 PM Medical Record Number: [id number] Patient Account Number: [id number] Date of Birth/Sex: May 25, 1921 (82 y.o. Female) Treating RN: Afful, RN, BSN, Velva Harman Primary Care Physician: Einar Pheasant Other Clinician: Referring Physician: Einar Pheasant Treating Physician/Extender: Frann Rider in Treatment: 0 Vascular Assessment Pulses: Posterior Tibial Palpable: [Left:Yes] [Right:Yes] Doppler: [Left:Multiphasic] [Right:Multiphasic] Dorsalis Pedis Palpable: [Left:Yes] [Right:Yes] Doppler: [Left:Multiphasic] [Right:Monophasic] Extremity colors, hair growth, and conditions: Extremity Color: [Left:Normal] [Right:Normal] Hair Growth on Extremity: [Left:Yes] [Right:Yes] Temperature of Extremity: [Left:Warm] [Right:Warm] Capillary Refill: [Left:< 3 seconds] [Right:< 3 seconds] Blood Pressure: Brachial: [Left:172] Dorsalis Pedis: [Left:Dorsalis Pedis: [id number] Ankle: Posterior Tibial: 158 [Left:Posterior Tibial: 160 0.92] [Right:0.95] Toe Nail Assessment Left: Right: Thick: Yes Yes Discolored: No No Deformed: No No Improper Length and Hygiene: No No Electronic Signature(s) Signed: 07/30/2015 4:56:50 PM By: Regan Lemming BSN, RN Entered By: Regan Lemming on 07/30/2015 13:51:24 Angela Patterson ([id number]) -------------------------------------------------------------------------------- Multi Wound Chart Details Patient Name: Angela Patterson. Date of Service: 07/30/2015 1:30 PM Medical Record Number: [id number] Patient Account Number: [id number] Date of Birth/Sex: [birth year] (82 y.o. 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] Comorbid History: [3:Cataracts, Glaucoma, Hypertension, Type II Diabetes, History of pressure wounds, Neuropathy] [N/A:N/A] Date Acquired: [3:07/01/2015] [N/A:N/A] Weeks of Treatment: [3:0] [N/A:N/A] Wound Status: [3:Open] [N/A:N/A] Measurements L x W x D 2x3x0.2 [N/A:N/A] (cm) Area (cm) : [3:4.712] [N/A:N/A] Volume (cm) : [3:0.942] [N/A:N/A] % Reduction in Area: [3:0.00%] [N/A:N/A] % Reduction in Volume: 0.00% [N/A:N/A] Classification: [3:Grade 1] [N/A:N/A] Exudate Amount: [3:Small] [N/A:N/A] Exudate Type: [3:Serous] [N/A:N/A] Exudate Color: [3:amber] [N/A:N/A] Wound Margin: [3:Distinct, outline attached N/A] Granulation Amount: [3:None Present (0%)] [N/A:N/A] Necrotic Amount: [3:Large (67-100%)] [N/A:N/A] Necrotic Tissue: [3:Eschar] [  N/A:N/A] Exposed Structures: [3:Fascia: No Fat: No] [N/A:N/A] Tendon: No Muscle: No Joint: No Bone: No Limited to Skin Breakdown Epithelialization: None N/A N/A Debridement: Debridement [id number]- N/A N/A [id number]) Time-Out Taken: Yes N/A N/A Pain Control: Lidocaine 4% Topical N/A N/A Solution Tissue Debrided: Fibrin/Slough, Callus, N/A N/A Subcutaneous Level: Skin/Subcutaneous N/A N/A Tissue Debridement Area (sq 6 N/A N/A cm): Instrument: Forceps, Scissors N/A N/A Bleeding: Minimum N/A N/A Hemostasis Achieved: Pressure N/A N/A Procedural Pain: 0 N/A N/A Post Procedural Pain: 0 N/A N/A Debridement Treatment Procedure was tolerated N/A N/A Response: well Post Debridement 2x3x0.2 N/A N/A Measurements  L x W x D (cm) Post Debridement 0.942 N/A N/A Volume: (cm) Periwound Skin Texture: Callus: Yes N/A N/A Edema: No Excoriation: No Induration: No Crepitus: No Fluctuance: No Friable: No Rash: No Scarring: No Periwound Skin Moist: Yes N/A N/A Moisture: Dry/Scaly: Yes Maceration: No Periwound Skin Color: Atrophie Blanche: No N/A N/A Cyanosis: No Ecchymosis: No Erythema: No Hemosiderin Staining: No Mottled: No Pallor: No Rubor: No IVELIS, MIMBS. ([id number]) Temperature: No Abnormality N/A N/A Tenderness on No N/A N/A Palpation: Wound Preparation: Ulcer Cleansing: N/A N/A Rinsed/Irrigated with Saline Topical Anesthetic Applied: Other: lidocaine 4% Procedures Performed: Debridement N/A N/A Treatment Notes Electronic Signature(s) Signed: 07/30/2015 4:56:50 PM By: Regan Lemming BSN, RN Entered By: Regan Lemming on 07/30/2015 14:29:07 Angela Patterson ([id number]) -------------------------------------------------------------------------------- [place] Details Patient Name: Angela Patterson. Date of Service: 07/30/2015 1:30 PM Medical Record Number: [id number] Patient Account Number: [id number] Date of Birth/Sex: 11-09-1920 (82 y.o. 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 Anxiety related to knowledge deficit of hyperbaric oxygen therapy and treatment procedures Discomfort related to temperature and humidity changes inside hyperbaric chamber Potential for barotraumas to ears, sinuses, teeth, and lungs or cerebral gas embolism related to changes in atmospheric pressure inside hyperbaric oxygen chamber Potential for oxygen toxicity seizures related to delivery of 100%  oxygen at an increased atmospheric pressure Potential for pulmonary oxygen toxicity related to delivery of 100% oxygen at an increased atmospheric pressure Goals: Barotrauma will be prevented during HBO2 Date Initiated: 07/30/2015 Goal Status: Active Patient and/or family will be able to state/discuss factors appropriate to the management of their disease process during treatment Date Initiated: 07/30/2015 Goal Status: Active Patient will tolerate the hyperbaric oxygen therapy treatment Date Initiated: 07/30/2015 Goal Status: Active Patient will tolerate the internal climate of the chamber Date Initiated: 07/30/2015 Goal Status: Active Patient/caregiver will verbalize understanding of HBO goals, rationale, procedures and potential hazards Date Initiated: 07/30/2015 Goal Status: Active Signs and symptoms of pulmonary oxygen toxicity will be recognized and promptly addressed Date Initiated: 07/30/2015 Goal Status: Active Signs and symptoms of seizure will be recognized and promptly addressed ; seizing patients will suffer no harm Date Initiated: 07/30/2015 Angela Patterson ([id number]) Goal Status: Active Interventions: Administer a five (5) minute air break for patient if signs and symptoms of seizure appear and notify the hyperbaric physician Administer a ten (10) minute air break for patient if signs and symptoms of seizure appear and notify the hyperbaric physician Administer decongestants, per physician orders, prior to HBO2 Administer the correct therapeutic gas delivery based on the patients needs and limitations, per physician order Assess and provide for patientos comfort related to the hyperbaric environment and equalization of middle ear Assess for  signs and symptoms related to adverse events, including but not limited to confinement anxiety, pneumothorax, oxygen toxicity and baurotrauma Assess patient for any history of confinement anxiety Assess patient's knowledge and  expectations regarding hyperbaric medicine and provide education related to the hyperbaric environment, goals of treatment and prevention of adverse events Implement protocols to decrease risk of pneumothorax in high risk patients Notes: Orientation to the Wound Care Program Nursing Diagnoses: Knowledge deficit related to the wound healing center program Goals: Patient/caregiver will verbalize understanding of the [facility] Program Date Initiated: 07/30/2015 Goal Status: Active Interventions: Provide education on orientation to the wound center Notes: Wound/Skin Impairment Nursing Diagnoses: Impaired tissue integrity Knowledge deficit related to smoking impact on wound healing Goals: Patient will have a decrease in wound volume by X% from date: (specify in notes) Date Initiated: 07/30/2015 Goal Status: Active Patient/caregiver will verbalize understanding of skin care regimen LISMARY, HANDS ([id number]) Date Initiated: 07/30/2015 Goal Status: Active Ulcer/skin breakdown will have a volume reduction of 30% by week 4 Date Initiated: 07/30/2015 Goal Status: Active Ulcer/skin breakdown will have a volume reduction of 50% by week 8 Date Initiated: 07/30/2015 Goal Status: Active Ulcer/skin breakdown will have a volume reduction of 80% by week 12 Date Initiated: 07/30/2015 Goal Status: Active Ulcer/skin breakdown will heal within 14 weeks Date Initiated: 07/30/2015 Goal Status: Active Interventions: Assess patient/caregiver ability to obtain necessary supplies Assess patient/caregiver ability to perform ulcer/skin care regimen upon admission and as needed Provide education on ulcer and skin care Notes: Electronic Signature(s) Signed: 07/30/2015 4:56:50 PM By: Regan Lemming BSN, RN Entered By: Regan Lemming on 07/30/2015 14:22:49 Angela Patterson ([id number]) -------------------------------------------------------------------------------- Pain Assessment Details Patient Name:  Angela Patterson. Date of Service: 07/30/2015 1:30 PM Medical Record Number: [id number] Patient Account Number: [id number] Date of Birth/Sex: [birth year] (82 y.o. Female) Treating RN: Baruch Gouty, RN, BSN, Velva Harman Primary Care Physician: Einar Pheasant Other Clinician: Referring Physician: Einar Pheasant Treating Physician/Extender: Frann Rider in Treatment: 0 Active Problems Location of Pain Severity and Description of Pain Patient Has Paino No Site Locations Pain Management and Medication Current Pain Management: Electronic Signature(s) Signed: 07/30/2015 4:56:50 PM By: Regan Lemming BSN, RN Entered By: Regan Lemming on 07/30/2015 13:31:47 Angela Patterson ([id number]) -------------------------------------------------------------------------------- Patient/Caregiver Education Details Patient Name: Angela Patterson. Date of Service: 07/30/2015 1:30 PM Medical Record Number: [id number] Patient Account Number: [id number] Date of Birth/Gender: 02/05/1921 (82 y.o. Female) Treating RN: Baruch Gouty, RN, BSN, Velva Harman Primary Care Physician: Einar Pheasant Other Clinician: Referring Physician: Einar Pheasant Treating Physician/Extender: Frann Rider in Treatment: 0 Education Assessment Education Provided To: Patient Education Topics Provided Welcome To [facility] [place]: Methods: Explain/Verbal Responses: State content correctly Wound/Skin Impairment: Methods: Explain/Verbal Responses: State content correctly Electronic Signature(s) Signed: 07/30/2015 4:56:50 PM By: Regan Lemming BSN, RN Entered By: Regan Lemming on 07/30/2015 14:48:20 Angela Patterson ([id number]) -------------------------------------------------------------------------------- Wound Assessment Details Patient Name: Angela Patterson. Date of Service: 07/30/2015 1:30 PM Medical Record Number: [id number] Patient Account Number: [id number] Date of Birth/Sex: May 02, 1921 (82 y.o. Female) Treating RN: Afful, RN, BSN, Velva Harman Primary Care  Physician: Einar Pheasant Other Clinician: Referring Physician: Einar Pheasant Treating Physician/Extender: Frann Rider in Treatment: 0 Wound Status Wound Number: 3 Primary Diabetic Wound/Ulcer of the Lower Etiology: Extremity Wound Location: Left Metatarsal head fifth - Plantar Wound Open Status: Wounding Event: Gradually Appeared Comorbid Cataracts, Glaucoma, Hypertension, Date Acquired: 07/01/2015 History: Type II Diabetes, History of pressure Weeks Of Treatment: 0 wounds, Neuropathy Clustered Wound: No Photos Photo Uploaded By:  Regan Lemming on 07/30/2015 16:33:24 Wound Measurements Length: (cm) 2 Width: (cm) 3 Depth: (cm) 0.2 Area: (cm) 4.712 Volume: (cm) 0.942 % Reduction in Area: 0% % Reduction in Volume: 0% Epithelialization: None Tunneling: No Undermining: No Wound Description Classification: Grade 1 Wound Margin: Distinct, outline attached Exudate Amount: Small Exudate Type: Serous Exudate Color: amber Foul Odor After Cleansing: No Wound Bed Granulation Amount: None Present (0%) Exposed Structure Necrotic Amount: Large (67-100%) Fascia Exposed: No Necrotic Quality: Eschar Fat Layer Exposed: No OLIVIAGRACE, TERNES ([id number]) Tendon Exposed: No Muscle Exposed: No Joint Exposed: No Bone Exposed: No Limited to Skin Breakdown Periwound Skin Texture Texture Color No Abnormalities Noted: No No Abnormalities Noted: No Callus: Yes Atrophie Blanche: No Crepitus: No Cyanosis: No Excoriation: No Ecchymosis: No Fluctuance: No Erythema: No Friable: No Hemosiderin Staining: No Induration: No Mottled: No Localized Edema: No Pallor: No Rash: No Rubor: No Scarring: No Temperature / Pain Moisture Temperature: No Abnormality No Abnormalities Noted: No Dry / Scaly: Yes Maceration: No Moist: Yes Wound Preparation Ulcer Cleansing: Rinsed/Irrigated with Saline Topical Anesthetic Applied: Other: lidocaine 4%, Treatment Notes Wound #3 (Left,  Plantar Metatarsal head fifth) 1. Cleansed with: Clean wound with Normal Saline 4. Dressing Applied: Aquacel Ag 5. Secondary Dressing Applied Bordered Foam Dressing 6. Footwear/Offloading device applied Wedge shoe Notes darco front Dietitian) Signed: 07/30/2015 4:56:50 PM By: Regan Lemming BSN, RN Entered By: Regan Lemming on 07/30/2015 13:43:45 Angela Patterson ([id number]) -------------------------------------------------------------------------------- Vitals Details Patient Name: Angela Patterson. Date of Service: 07/30/2015 1:30 PM Medical Record Number: [id number] Patient Account Number: [id number] Date of Birth/Sex: [birth year] (82 y.o. Female) Treating RN: Afful, RN, BSN, [place] Primary Care Physician: Einar Pheasant Other Clinician: Referring Physician: Einar Pheasant Treating Physician/Extender: Frann Rider in Treatment: 0 Vital Signs Time Taken: 13:32 Pulse (bpm): 68 Height (in): 65 Respiratory Rate (breaths/min): 18 Source: Stated Blood Pressure (mmHg): 172/53 Weight (lbs): 107 Reference Range: 80 - 120 mg / dl Source: Measured Body Mass Index (BMI): 17.8 Electronic Signature(s) Signed: 07/30/2015 4:56:50 PM By: Regan Lemming BSN, RN Entered By: Regan Lemming on 07/30/2015 13:36:47

## 2015-08-03 ENCOUNTER — Other Ambulatory Visit: Payer: Self-pay | Admitting: Internal Medicine

## 2015-08-06 ENCOUNTER — Encounter: Payer: Medicare Other | Admitting: Surgery

## 2015-08-06 DIAGNOSIS — I10 Essential (primary) hypertension: Secondary | ICD-10-CM | POA: Diagnosis not present

## 2015-08-06 DIAGNOSIS — L97522 Non-pressure chronic ulcer of other part of left foot with fat layer exposed: Secondary | ICD-10-CM | POA: Diagnosis not present

## 2015-08-06 DIAGNOSIS — E11621 Type 2 diabetes mellitus with foot ulcer: Secondary | ICD-10-CM | POA: Diagnosis not present

## 2015-08-06 DIAGNOSIS — L84 Corns and callosities: Secondary | ICD-10-CM | POA: Diagnosis not present

## 2015-08-06 DIAGNOSIS — Z8673 Personal history of transient ischemic attack (TIA), and cerebral infarction without residual deficits: Secondary | ICD-10-CM | POA: Diagnosis not present

## 2015-08-06 DIAGNOSIS — E1140 Type 2 diabetes mellitus with diabetic neuropathy, unspecified: Secondary | ICD-10-CM | POA: Diagnosis not present

## 2015-08-07 NOTE — Progress Notes (Signed)
NADIRAH, FINNEN ([id number]) Visit Report for 08/06/2015 Chief Complaint Document Details Patient Name: Angela Patterson, Angela Patterson. Date of Service: 08/06/2015 3:45 PM Medical Record Patient Account Number: [id number] Number: Afful, RN, BSN, Treating RN: 1921/04/15 [phone] y.o. Velva Harman Date of Birth/Sex: Female) Other Clinician: Primary Care Physician: Einar Pheasant Treating Christin Fudge Referring Physician: Einar Pheasant Physician/Extender: Suella Grove in Treatment: 1 Information Obtained from: Patient Chief Complaint Patient presents to the wound care center for a consult due non healing wound Pleasant 82 year old comes along with her daughter with a recurrent ulceration and callosity on her left foot. she has had this for about 3 weeks now. Electronic Signature(s) Signed: 08/06/2015 4:02:23 PM By: Christin Fudge MD, FACS Entered By: Christin Fudge on 08/06/2015 16:02:23 Angela Patterson ([id number]) -------------------------------------------------------------------------------- HPI Details Patient Name: Angela Patterson. Date of Service: 08/06/2015 3:45 PM Medical Record Patient Account Number: [id number] Number: Afful, RN, BSN, Treating RN: Jun 16, 1921 [phone] y.o. Velva Harman Date of Birth/Sex: Female) Other Clinician: Primary Care Physician: Einar Pheasant Treating Christin Fudge Referring Physician: Einar Pheasant Physician/Extender: Weeks in Treatment: 1 History of Present Illness Location: left foot Quality: mild pain occasionally. Severity: the problem is been getting worse for the last 3 weeks Duration: she's had this for almost a year now on and off. Timing: pain is intermittent and occasional. Context: she was seen by me earlier in March and again in May and was healed by early June 2016 Modifying Factors: she has had diabetic shoes made and is compliant with wearing these. Associated Signs and Symptoms: worse on standing on her feet for too long. HPI Description: this very  pleasant 82 year old patient who is extremely active and ambulating all day has had problems with the left foot for over a year. Her daughter who is her caregiver says she drives around and even goes around and does her chores by herself. this is the third recurrence and after the last time we saw in June and discharged her her daughter has got her to pace of diabetic shoes and she's been wearing these regularly. She has been on the shoes for about 6 months now. Her diabetes is under control and her blood sugars run from a anywhere between 110 to 130 and she checks them twice a day. no fever or discharge. During her initial visit, an x-ray of her left foot was done on 09/26/2014. There was no cortical erosion noted there was mild osteopenia in the distal first metatarsal. Radiologist has recommended a MRI of the left foot to exclude osteomyelitis. MRI done on 10/09/2014 reveals that the area where she had a phlegmon is without osteomyelitis. the bone marrow and other areas are compatible with reactive edema rather than osteomyelitis. 08/06/2015 -- x-ray of the left foot done on 07/30/2015 shows IMPRESSION:Soft tissue wound with a bandage is noted over the plantar aspect of the distal left foot. Diffuse osteopenia degenerative change. No acute bony abnormality Electronic Signature(s) Signed: 08/06/2015 4:03:08 PM By: Christin Fudge MD, FACS Entered By: Christin Fudge on 08/06/2015 16:03:08 Angela Patterson ([id number]) -------------------------------------------------------------------------------- Physical Exam Details Patient Name: Angela Patterson. Date of Service: 08/06/2015 3:45 PM Medical Record Patient Account Number: [id number] Number: Afful, RN, BSN, Treating RN: Apr 16, 1921 [phone] y.o. Velva Harman Date of Birth/Sex: Female) Other Clinician: Primary Care Physician: Einar Pheasant Treating Christin Fudge Referring Physician: Einar Pheasant Physician/Extender: Weeks in Treatment:  1 Constitutional . Pulse regular. Respirations normal and unlabored. Afebrile. . Eyes Nonicteric. Reactive to light. Ears, Nose, Mouth, and Throat Lips, teeth, and  gums WNL.Marland Kitchen Moist mucosa without lesions. Neck supple and nontender. No palpable supraclavicular or cervical adenopathy. Normal sized without goiter. Respiratory WNL. No retractions.. Breath sounds WNL, No rubs, rales, rhonchi, or wheeze.. Cardiovascular Heart rhythm and rate regular, no murmur or gallop.. Pedal Pulses WNL. No clubbing, cyanosis or edema. Chest Breasts symmetical and no nipple discharge.. Breast tissue WNL, no masses, lumps, or tenderness.. Lymphatic No adneopathy. No adenopathy. No adenopathy. Musculoskeletal Adexa without tenderness or enlargement.. Digits and nails w/o clubbing, cyanosis, infection, petechiae, ischemia, or inflammatory conditions.. Integumentary (Hair, Skin) No suspicious lesions. No crepitus or fluctuance. No peri-wound warmth or erythema. No masses.Marland Kitchen Psychiatric Judgement and insight Intact.. No evidence of depression, anxiety, or agitation.. Notes the callus is supple and there is good resolution around the ulcerated area which is clean and does not have any debris to sharply dissect Electronic Signature(s) Signed: 08/06/2015 4:03:36 PM By: Christin Fudge MD, FACS Entered By: Christin Fudge on 08/06/2015 16:03:35 Angela Patterson ([id number]) -------------------------------------------------------------------------------- Physician Orders Details Patient Name: Angela Patterson. Date of Service: 08/06/2015 3:45 PM Medical Record Patient Account Number: [id number] Number: Afful, RN, BSN, Treating RN: [birth year] [phone] y.o. Velva Harman Date of Birth/Sex: Female) Other Clinician: Primary Care Physician: Einar Pheasant Treating Christin Fudge Referring Physician: Einar Pheasant Physician/Extender: Suella Grove in Treatment: 1 Verbal / Phone Orders: Yes Clinician: Afful, RN, BSN, Rita Read  Back and Verified: Yes Diagnosis Coding Wound Cleansing Wound #3 Left,Plantar Metatarsal head fifth o Cleanse wound with mild soap and water o May Shower, gently pat wound dry prior to applying new dressing. Anesthetic Wound #3 Left,Plantar Metatarsal head fifth o Topical Lidocaine 4% cream applied to wound bed prior to debridement Primary Wound Dressing Wound #3 Left,Plantar Metatarsal head fifth o Aquacel Ag Secondary Dressing Wound #3 Left,Plantar Metatarsal head fifth o Boardered Foam Dressing Dressing Change Frequency Wound #3 Left,Plantar Metatarsal head fifth o Change dressing every other day. Follow-up Appointments Wound #3 Left,Plantar Metatarsal head fifth o Return Appointment in 1 week. Off-Loading Wound #3 Left,Plantar Metatarsal head fifth o Open toe surgical shoe to: - front offloader darco Additional Orders / Instructions Wound #3 Left,Plantar Metatarsal head fifth o Increase protein intake. o Activity as tolerated Angela Patterson, Angela Patterson ([id number]) o Other: - Keep blood sugars under control Electronic Signature(s) Signed: 08/06/2015 4:29:09 PM By: Christin Fudge MD, FACS Signed: 08/06/2015 5:00:07 PM By: Regan Lemming BSN, RN Entered By: Regan Lemming on 08/06/2015 15:58:05 Angela Patterson ([id number]) -------------------------------------------------------------------------------- Problem List Details Patient Name: Angela Patterson. Date of Service: 08/06/2015 3:45 PM Medical Record Patient Account Number: [id number] Number: Afful, RN, BSN, Treating RN: 1920-10-07 [phone] y.o. Velva Harman Date of Birth/Sex: Female) Other Clinician: Primary Care Physician: Einar Pheasant Treating Christin Fudge Referring Physician: Einar Pheasant Physician/Extender: Suella Grove in Treatment: 1 Active Problems ICD-10 Encounter Code Description Active Date Diagnosis E11.621 Type 2 diabetes mellitus with foot ulcer 07/30/2015 Yes L97.522 Non-pressure chronic ulcer of  other part of left foot with fat 07/30/2015 Yes layer exposed L84 Corns and callosities 07/30/2015 Yes Inactive Problems Resolved Problems Electronic Signature(s) Signed: 08/06/2015 4:02:17 PM By: Christin Fudge MD, FACS Entered By: Christin Fudge on 08/06/2015 16:02:17 Angela Patterson ([id number]) -------------------------------------------------------------------------------- Progress Note Details Patient Name: Angela Patterson. Date of Service: 08/06/2015 3:45 PM Medical Record Patient Account Number: [id number] Number: Afful, RN, BSN, Treating RN: 1921/05/27 [phone] y.o. Velva Harman Date of Birth/Sex: Female) Other Clinician: Primary Care Physician: Einar Pheasant Treating Christin Fudge Referring Physician: Einar Pheasant Physician/Extender: Suella Grove in Treatment: 1 Subjective Chief Complaint  Information obtained from Patient Patient presents to the wound care center for a consult due non healing wound Pleasant 82 year old comes along with her daughter with a recurrent ulceration and callosity on her left foot. she has had this for about 3 weeks now. History of Present Illness (HPI) The following HPI elements were documented for the patient's wound: Location: left foot Quality: mild pain occasionally. Severity: the problem is been getting worse for the last 3 weeks Duration: she's had this for almost a year now on and off. Timing: pain is intermittent and occasional. Context: she was seen by me earlier in March and again in May and was healed by early June 2016 Modifying Factors: she has had diabetic shoes made and is compliant with wearing these. Associated Signs and Symptoms: worse on standing on her feet for too long. this very pleasant 82 year old patient who is extremely active and ambulating all day has had problems with the left foot for over a year. Her daughter who is her caregiver says she drives around and even goes around and does her chores by herself. this is the  third recurrence and after the last time we saw in June and discharged her her daughter has got her to pace of diabetic shoes and she's been wearing these regularly. She has been on the shoes for about 6 months now. Her diabetes is under control and her blood sugars run from a anywhere between 110 to 130 and she checks them twice a day. no fever or discharge. During her initial visit, an x-ray of her left foot was done on 09/26/2014. There was no cortical erosion noted there was mild osteopenia in the distal first metatarsal. Radiologist has recommended a MRI of the left foot to exclude osteomyelitis. MRI done on 10/09/2014 reveals that the area where she had a phlegmon is without osteomyelitis. the bone marrow and other areas are compatible with reactive edema rather than osteomyelitis. 08/06/2015 -- x-ray of the left foot done on 07/30/2015 shows IMPRESSION:Soft tissue wound with a bandage is noted over the plantar aspect of the distal left foot. Diffuse osteopenia degenerative change. No acute bony abnormality Angela Patterson, Angela Patterson. ([id number]) Objective Constitutional Pulse regular. Respirations normal and unlabored. Afebrile. Vitals Time Taken: 3:51 PM, Height: 65 in, Weight: 107 lbs, BMI: 17.8, Temperature: 97.3 F, Pulse: 72 bpm, Respiratory Rate: 20 breaths/min, Blood Pressure: 160/80 mmHg. Eyes Nonicteric. Reactive to light. Ears, Nose, Mouth, and Throat Lips, teeth, and gums WNL.Marland Kitchen Moist mucosa without lesions. Neck supple and nontender. No palpable supraclavicular or cervical adenopathy. Normal sized without goiter. Respiratory WNL. No retractions.. Breath sounds WNL, No rubs, rales, rhonchi, or wheeze.. Cardiovascular Heart rhythm and rate regular, no murmur or gallop.. Pedal Pulses WNL. No clubbing, cyanosis or edema. Chest Breasts symmetical and no nipple discharge.. Breast tissue WNL, no masses, lumps, or tenderness.. Lymphatic No adneopathy. No adenopathy. No  adenopathy. Musculoskeletal Adexa without tenderness or enlargement.. Digits and nails w/o clubbing, cyanosis, infection, petechiae, ischemia, or inflammatory conditions.Marland Kitchen Psychiatric Judgement and insight Intact.. No evidence of depression, anxiety, or agitation.. General Notes: the callus is supple and there is good resolution around the ulcerated area which is clean and does not have any debris to sharply dissect Integumentary (Hair, Skin) No suspicious lesions. No crepitus or fluctuance. No peri-wound warmth or erythema. No masses.Marland Kitchen Angela Patterson, [id number] ([id number]) Wound #3 status is Open. Original cause of wound was Gradually Appeared. The wound is located on the Left,Plantar Metatarsal head fifth. The wound measures 1.2cm length x  1cm width x 0.2cm depth; 0.942cm^2 area and 0.188cm^3 volume. The wound is limited to skin breakdown. There is no tunneling or undermining noted. There is a small amount of serous drainage noted. The wound margin is distinct with the outline attached to the wound base. There is large (67-100%) pink, pale granulation within the wound bed. There is a small (1-33%) amount of necrotic tissue within the wound bed including Eschar. The periwound skin appearance exhibited: Callus, Dry/Scaly, Moist. The periwound skin appearance did not exhibit: Crepitus, Excoriation, Fluctuance, Friable, Induration, Localized Edema, Rash, Scarring, Maceration, Atrophie Blanche, Cyanosis, Ecchymosis, Hemosiderin Staining, Mottled, Pallor, Rubor, Erythema. Periwound temperature was noted as No Abnormality. Assessment Active Problems ICD-10 E11.621 - Type 2 diabetes mellitus with foot ulcer L97.522 - Non-pressure chronic ulcer of other part of left foot with fat layer exposed L84 - Corns and callosities Plan Wound Cleansing: Wound #3 Left,Plantar Metatarsal head fifth: Cleanse wound with mild soap and water May Shower, gently pat wound dry prior to applying new  dressing. Anesthetic: Wound #3 Left,Plantar Metatarsal head fifth: Topical Lidocaine 4% cream applied to wound bed prior to debridement Primary Wound Dressing: Wound #3 Left,Plantar Metatarsal head fifth: Aquacel Ag Secondary Dressing: Wound #3 Left,Plantar Metatarsal head fifth: Boardered Foam Dressing Dressing Change Frequency: Wound #3 Left,Plantar Metatarsal head fifth: Change dressing every other day. Follow-up Appointments: Wound #3 Left,Plantar Metatarsal head fifth: Return Appointment in 1 week. Off-Loading: Angela Patterson, Angela Patterson ([id number]) Wound #3 Left,Plantar Metatarsal head fifth: Open toe surgical shoe to: - front offloader darco Additional Orders / Instructions: Wound #3 Left,Plantar Metatarsal head fifth: Increase protein intake. Activity as tolerated Other: - Keep blood sugars under control I have recommended silver alginate with a offloading felt ring around it. She will also use a Darco front off loading shoe. She will come back to see me every week. The daughter and I have discussed need for a podiatry opinion which she will be having next week and also talk to the [facility] for possible readjustment of her insoles appropriately. Electronic Signature(s) Signed: 08/06/2015 4:04:52 PM By: Christin Fudge MD, FACS Entered By: Christin Fudge on 08/06/2015 16:04:51 Angela Patterson ([id number]) -------------------------------------------------------------------------------- SuperBill Details Patient Name: Angela Patterson. Date of Service: 08/06/2015 Medical Record Patient Account Number: [id number] Number: Afful, RN, BSN, Treating RN: [birth year] [phone] y.o. Velva Harman Date of Birth/Sex: Female) Other Clinician: Primary Care Physician: Einar Pheasant Treating Christin Fudge Referring Physician: Einar Pheasant Physician/Extender: Suella Grove in Treatment: 1 Diagnosis Coding ICD-10 Codes Code Description E11.621 Type 2 diabetes mellitus with foot ulcer L97.522  Non-pressure chronic ulcer of other part of left foot with fat layer exposed L84 Corns and callosities Facility Procedures CPT4 Code: [id number] Description: [id number] - WOUND CARE VISIT-LEV 2 EST PT Modifier: Quantity: 1 Physician Procedures CPT4 Code Description: [id number] - WC PHYS LEVEL 3 - EST PT ICD-10 Description Diagnosis E11.621 Type 2 diabetes mellitus with foot ulcer L97.522 Non-pressure chronic ulcer of other part of left foo L84 Corns and callosities Modifier: t with fat layer Quantity: 1 exposed Electronic Signature(s) Signed: 08/06/2015 4:05:04 PM By: Christin Fudge MD, FACS Entered By: Christin Fudge on 08/06/2015 16:05:04

## 2015-08-07 NOTE — Progress Notes (Signed)
Angela, Patterson ([id number]) Visit Report for 08/06/2015 Arrival Information Details Patient Name: Angela Patterson, Angela Patterson. Date of Service: 08/06/2015 3:45 PM Medical Record Number: [id number] Patient Account Number: [id number] Date of Birth/Sex: Dec 12, 1920 (82 y.o. Female) Treating RN: Afful, RN, BSN, Velva Harman Primary Care Physician: Einar Pheasant Other Clinician: Referring Physician: Einar Pheasant Treating Physician/Extender: Frann Rider in Treatment: 1 Visit Information History Since Last Visit Added or deleted any medications: No Patient Arrived: Ambulatory Any new allergies or adverse reactions: No Arrival Time: 15:45 Had a fall or experienced change in No Accompanied By: dtr activities of daily living that may affect Transfer Assistance: None risk of falls: Patient Identification Verified: Yes Signs or symptoms of abuse/neglect since last No Secondary Verification Process Yes visito Completed: Hospitalized since last visit: No Patient Requires Transmission-Based No Has Dressing in Place as Prescribed: Yes Precautions: Pain Present Now: No Patient Has Alerts: No Electronic Signature(s) Signed: 08/06/2015 5:00:07 PM By: Regan Lemming BSN, RN Entered By: Regan Lemming on 08/06/2015 15:45:47 Angela Patterson ([id number]) -------------------------------------------------------------------------------- Clinic Level of Care Assessment Details Patient Name: Angela Patterson. Date of Service: 08/06/2015 3:45 PM Medical Record Number: [id number] Patient Account Number: [id number] Date of Birth/Sex: [birth year] (82 y.o. Female) Treating RN: Afful, RN, BSN, Velva Harman Primary Care Physician: Einar Pheasant Other Clinician: Referring Physician: Einar Pheasant Treating Physician/Extender: Frann Rider in Treatment: 1 Clinic Level of Care Assessment Items TOOL 4 Quantity Score []  - Use when only an EandM is performed on FOLLOW-UP visit 0 ASSESSMENTS - Nursing Assessment / Reassessment X  - Reassessment of Co-morbidities (includes updates in patient status) 1 10 X - Reassessment of Adherence to Treatment Plan 1 5 ASSESSMENTS - Wound and Skin Assessment / Reassessment X - Simple Wound Assessment / Reassessment - one wound 1 5 []  - Complex Wound Assessment / Reassessment - multiple wounds 0 []  - Dermatologic / Skin Assessment (not related to wound area) 0 ASSESSMENTS - Focused Assessment []  - Circumferential Edema Measurements - multi extremities 0 []  - Nutritional Assessment / Counseling / Intervention 0 X - Lower Extremity Assessment (monofilament, tuning fork, pulses) 1 5 []  - Peripheral Arterial Disease Assessment (using hand held doppler) 0 ASSESSMENTS - Ostomy and/or Continence Assessment and Care []  - Incontinence Assessment and Management 0 []  - Ostomy Care Assessment and Management (repouching, etc.) 0 PROCESS - Coordination of Care X - Simple Patient / Family Education for ongoing care 1 15 []  - Complex (extensive) Patient / Family Education for ongoing care 0 []  - Staff obtains Programmer, systems, Records, Test Results / Process Orders 0 []  - Staff telephones HHA, Nursing Homes / Clarify orders / etc 0 []  - Routine Transfer to another Facility (non-emergent condition) 0 Angela Patterson, Angela Patterson ([id number]) []  - Routine Hospital Admission (non-emergent condition) 0 []  - New Admissions / Insurance Authorizations / Ordering NPWT, Apligraf, etc. 0 []  - Emergency Hospital Admission (emergent condition) 0 []  - Simple Discharge Coordination 0 []  - Complex (extensive) Discharge Coordination 0 PROCESS - Special Needs []  - Pediatric / Minor Patient Management 0 []  - Isolation Patient Management 0 []  - Hearing / Language / Visual special needs 0 []  - Assessment of Community assistance (transportation, D/C planning, etc.) 0 []  - Additional assistance / Altered mentation 0 []  - Support Surface(s) Assessment (bed, cushion, seat, etc.) 0 INTERVENTIONS - Wound Cleansing / Measurement X -  Simple Wound Cleansing - one wound 1 5 []  - Complex Wound Cleansing - multiple wounds 0 X - Wound Imaging (photographs - any number of  wounds) 1 5 []  - Wound Tracing (instead of photographs) 0 X - Simple Wound Measurement - one wound 1 5 []  - Complex Wound Measurement - multiple wounds 0 INTERVENTIONS - Wound Dressings X - Small Wound Dressing one or multiple wounds 1 10 []  - Medium Wound Dressing one or multiple wounds 0 []  - Large Wound Dressing one or multiple wounds 0 []  - Application of Medications - topical 0 []  - Application of Medications - injection 0 INTERVENTIONS - Miscellaneous []  - External ear exam 0 Angela Patterson, Angela Patterson ([id number]) []  - Specimen Collection (cultures, biopsies, blood, body fluids, etc.) 0 []  - Specimen(s) / Culture(s) sent or taken to Lab for analysis 0 []  - Patient Transfer (multiple staff / Harrel Lemon Lift / Similar devices) 0 []  - Simple Staple / Suture removal (25 or less) 0 []  - Complex Staple / Suture removal (26 or more) 0 []  - Hypo / Hyperglycemic Management (close monitor of Blood Glucose) 0 []  - Ankle / Brachial Index (ABI) - do not check if billed separately 0 X - Vital Signs 1 5 Has the patient been seen at the hospital within the last three years: Yes Total Score: 70 Level Of Care: New/Established - Level 2 Electronic Signature(s) Signed: 08/06/2015 5:00:07 PM By: Regan Lemming BSN, RN Entered By: Regan Lemming on 08/06/2015 15:58:37 Angela Patterson ([id number]) -------------------------------------------------------------------------------- Encounter Discharge Information Details Patient Name: Angela Patterson. Date of Service: 08/06/2015 3:45 PM Medical Record Number: [id number] Patient Account Number: [id number] Date of Birth/Sex: 1920-12-15 (82 y.o. Female) Treating RN: Afful, RN, BSN, Velva Harman Primary Care Physician: Einar Pheasant Other Clinician: Referring Physician: Einar Pheasant Treating Physician/Extender: Frann Rider in Treatment:  1 Encounter Discharge Information Items Discharge Pain Level: 0 Discharge Condition: Stable Ambulatory Status: Ambulatory Discharge Destination: Home Transportation: Private Auto Accompanied By: dtr Schedule Follow-up Appointment: No Medication Reconciliation completed and provided to Patient/Care No Angela Patterson: Provided on Clinical Summary of Care: 08/06/2015 Form Type Recipient Paper Patient EG Electronic Signature(s) Signed: 08/06/2015 4:07:31 PM By: Ruthine Dose Entered By: Ruthine Dose on 08/06/2015 16:07:30 Angela Patterson ([id number]) -------------------------------------------------------------------------------- Lower Extremity Assessment Details Patient Name: Angela Patterson. Date of Service: 08/06/2015 3:45 PM Medical Record Number: [id number] Patient Account Number: [id number] Date of Birth/Sex: 1921-06-18 (82 y.o. Female) Treating RN: Afful, RN, BSN, Velva Harman Primary Care Physician: Einar Pheasant Other Clinician: Referring Physician: Einar Pheasant Treating Physician/Extender: Frann Rider in Treatment: 1 Vascular Assessment Pulses: Posterior Tibial Dorsalis Pedis Palpable: [Left:Yes] Extremity colors, hair growth, and conditions: Extremity Color: [Left:Normal] Hair Growth on Extremity: [Left:No] Temperature of Extremity: [Left:Warm] Capillary Refill: [Left:< 3 seconds] Toe Nail Assessment Left: Right: Thick: Yes Discolored: No Deformed: No Improper Length and Hygiene: No Electronic Signature(s) Signed: 08/06/2015 5:00:07 PM By: Regan Lemming BSN, RN Entered By: Regan Lemming on 08/06/2015 15:48:16 Angela Patterson ([id number]) -------------------------------------------------------------------------------- Multi Wound Chart Details Patient Name: Angela Patterson. Date of Service: 08/06/2015 3:45 PM Medical Record Number: [id number] Patient Account Number: [id number] Date of Birth/Sex: 1920-09-05 (82 y.o. 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] Comorbid History: [3:Cataracts, Glaucoma, Hypertension, Type II Diabetes, History of  pressure wounds, Neuropathy] [N/A:N/A] Date Acquired: [3:07/01/2015] [N/A:N/A] Weeks of Treatment: [3:1] [N/A:N/A] Wound Status: [3:Open] [N/A:N/A] Measurements L x W x D 1.2x1x0.2 [N/A:N/A] (cm) Area (cm) : [3:0.942] [N/A:N/A] Volume (cm) : [3:0.188] [N/A:N/A] % Reduction in Area: [3:80.00%] [N/A:N/A] % Reduction in Volume: 80.00% [N/A:N/A] Classification: [3:Grade 1] [N/A:N/A] Exudate Amount: [3:Small] [N/A:N/A] Exudate Type: [3:Serous] [N/A:N/A] Exudate Color: [3:amber] [N/A:N/A] Wound Margin: [3:Distinct, outline attached N/A] Granulation Amount: [3:Large (67-100%)] [N/A:N/A] Granulation Quality: [3:Pink, Pale] [N/A:N/A] Necrotic Amount: [3:Small (1-33%)] [N/A:N/A] Necrotic Tissue: [3:Eschar] [N/A:N/A] Exposed Structures: [N/A:N/A] Fascia: No Fat: No Tendon: No Muscle: No Joint: No Bone: No Limited to Skin Breakdown Epithelialization: None N/A N/A Periwound Skin Texture: Callus: Yes N/A N/A Edema: No Excoriation: No Induration: No Crepitus: No Fluctuance: No Friable: No Rash: No Scarring: No Periwound Skin Moist: Yes N/A N/A Moisture: Dry/Scaly: Yes Maceration: No Periwound Skin Color: Atrophie Blanche: No N/A N/A Cyanosis: No Ecchymosis: No Erythema: No Hemosiderin Staining: No Mottled: No Pallor: No Rubor: No Temperature: No  Abnormality N/A N/A Tenderness on No N/A N/A Palpation: Wound Preparation: Ulcer Cleansing: N/A N/A Rinsed/Irrigated with Saline Topical Anesthetic Applied: Other: lidocaine 4% Treatment Notes Electronic Signature(s) Signed: 08/06/2015 5:00:07 PM By: Regan Lemming BSN, RN Entered By: Regan Lemming on 08/06/2015 15:57:17 Angela Patterson ([id number]) -------------------------------------------------------------------------------- [facility] Details Patient Name: Angela Patterson. Date of Service: 08/06/2015 3:45 PM Medical Record Number: [id number] Patient Account Number: [id number] Date of Birth/Sex: 1921-02-22 (82 y.o. 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 Anxiety related to knowledge deficit of hyperbaric oxygen therapy and treatment procedures Discomfort related to temperature and humidity changes inside hyperbaric chamber Potential for barotraumas to ears, sinuses, teeth, and lungs or cerebral gas embolism related to changes in atmospheric pressure inside hyperbaric oxygen chamber Potential for oxygen toxicity seizures related to delivery of 100% oxygen at an increased atmospheric pressure Potential for pulmonary oxygen toxicity related to delivery of 100% oxygen at an increased atmospheric pressure Goals: Barotrauma will be prevented during HBO2 Date Initiated: 07/30/2015 Goal Status: Active Patient and/or family will be able to state/discuss factors appropriate to the management of their disease process during treatment Date Initiated: 07/30/2015 Goal Status: Active Patient will tolerate the hyperbaric oxygen therapy treatment Date Initiated: 07/30/2015 Goal Status:  Active Patient will tolerate the internal climate of the chamber Date Initiated: 07/30/2015 Goal Status: Active Patient/caregiver will verbalize understanding of HBO goals, rationale, procedures and potential hazards Date Initiated: 07/30/2015 Goal Status: Active Signs and symptoms of pulmonary oxygen toxicity will be recognized and promptly addressed Date Initiated: 07/30/2015 Goal Status: Active Signs and symptoms of seizure will be recognized and promptly addressed ; seizing patients will suffer no harm Date Initiated: 07/30/2015 Angela Patterson ([id number]) Goal Status: Active Interventions: Administer a five (5) minute air break for patient if signs and symptoms of seizure appear and notify the hyperbaric physician Administer a ten (10) minute air break for patient if signs and symptoms of seizure appear and notify the hyperbaric physician Administer decongestants, per physician orders, prior to HBO2 Administer the correct therapeutic gas delivery based on the patients needs and limitations, per physician order Assess and provide for patientos comfort related to the hyperbaric environment and equalization of middle ear Assess for signs and symptoms related to adverse events, including but not limited to  confinement anxiety, pneumothorax, oxygen toxicity and baurotrauma Assess patient for any history of confinement anxiety Assess patient's knowledge and expectations regarding hyperbaric medicine and provide education related to the hyperbaric environment, goals of treatment and prevention of adverse events Implement protocols to decrease risk of pneumothorax in high risk patients Notes: Orientation to the Wound Care Program Nursing Diagnoses: Knowledge deficit related to the wound healing center program Goals: Patient/caregiver will verbalize understanding of the [facility] Date Initiated: 07/30/2015 Goal Status: Active Interventions: Provide education on  orientation to the wound center Notes: Wound/Skin Impairment Nursing Diagnoses: Impaired tissue integrity Knowledge deficit related to smoking impact on wound healing Goals: Patient will have a decrease in wound volume by X% from date: (specify in notes) Date Initiated: 07/30/2015 Goal Status: Active Patient/caregiver will verbalize understanding of skin care regimen Angela Patterson, Angela Patterson ([id number]) Date Initiated: 07/30/2015 Goal Status: Active Ulcer/skin breakdown will have a volume reduction of 30% by week 4 Date Initiated: 07/30/2015 Goal Status: Active Ulcer/skin breakdown will have a volume reduction of 50% by week 8 Date Initiated: 07/30/2015 Goal Status: Active Ulcer/skin breakdown will have a volume reduction of 80% by week 12 Date Initiated: 07/30/2015 Goal Status: Active Ulcer/skin breakdown will heal within 14 weeks Date Initiated: 07/30/2015 Goal Status: Active Interventions: Assess patient/caregiver ability to obtain necessary supplies Assess patient/caregiver ability to perform ulcer/skin care regimen upon admission and as needed Provide education on ulcer and skin care Notes: Electronic Signature(s) Signed: 08/06/2015 5:00:07 PM By: Regan Lemming BSN, RN Entered By: Regan Lemming on 08/06/2015 15:57:08 Angela Patterson ([id number]) -------------------------------------------------------------------------------- Pain Assessment Details Patient Name: Angela Patterson. Date of Service: 08/06/2015 3:45 PM Medical Record Number: [id number] Patient Account Number: [id number] Date of Birth/Sex: Jun 15, 1921 (82 y.o. Female) Treating RN: Baruch Gouty, RN, BSN, Velva Harman Primary Care Physician: Einar Pheasant Other Clinician: Referring Physician: Einar Pheasant Treating Physician/Extender: Frann Rider in Treatment: 1 Active Problems Location of Pain Severity and Description of Pain Patient Has Paino No Site Locations Pain Management and Medication Current Pain Management: Electronic  Signature(s) Signed: 08/06/2015 5:00:07 PM By: Regan Lemming BSN, RN Entered By: Regan Lemming on 08/06/2015 15:45:59 Angela Patterson ([id number]) -------------------------------------------------------------------------------- Patient/Caregiver Education Details Patient Name: Angela Patterson. Date of Service: 08/06/2015 3:45 PM Medical Record Number: [id number] Patient Account Number: [id number] Date of Birth/Gender: 08-22-1920 (82 y.o. Female) Treating RN: Baruch Gouty, RN, BSN, Velva Harman Primary Care Physician: Einar Pheasant Other Clinician: Referring Physician: Einar Pheasant Treating Physician/Extender: Frann Rider in Treatment: 1 Education Assessment Education Provided To: Patient Education Topics Provided Welcome To The [facility]: Methods: Explain/Verbal Responses: State content correctly Wound/Skin Impairment: Methods: Explain/Verbal Responses: State content correctly Electronic Signature(s) Signed: 08/06/2015 5:00:07 PM By: Regan Lemming BSN, RN Entered By: Regan Lemming on 08/06/2015 15:59:41 Angela Patterson ([id number]) -------------------------------------------------------------------------------- Wound Assessment Details Patient Name: Angela Patterson. Date of Service: 08/06/2015 3:45 PM Medical Record Number: [id number] Patient Account Number: [id number] Date of Birth/Sex: [birth year] (82 y.o. Female) Treating RN: Afful, RN, BSN, Velva Harman Primary Care Physician: Einar Pheasant Other Clinician: Referring Physician: Einar Pheasant Treating Physician/Extender: Frann Rider in Treatment: 1 Wound Status Wound Number: 3 Primary Diabetic Wound/Ulcer of the Lower Etiology: Extremity Wound Location: Left Metatarsal head fifth - Plantar Wound Open Status: Wounding Event: Gradually Appeared Comorbid Cataracts, Glaucoma, Hypertension, Date Acquired: 07/01/2015 History: Type II Diabetes, History of pressure Weeks Of Treatment: 1 wounds, Neuropathy Clustered Wound:  No Photos Photo Uploaded By: Regan Lemming on 08/06/2015 16:33:15 Wound Measurements Length: (cm) 1.2 Width: (cm)  1 Depth: (cm) 0.2 Area: (cm) 0.942 Volume: (cm) 0.188 % Reduction in Area: 80% % Reduction in Volume: 80% Epithelialization: None Tunneling: No Undermining: No Wound Description Classification: Grade 1 Wound Margin: Distinct, outline attached Exudate Amount: Small Exudate Type: Serous Exudate Color: amber Foul Odor After Cleansing: No Wound Bed Granulation Amount: Large (67-100%) Exposed Structure Granulation Quality: Pink, Pale Fascia Exposed: No Necrotic Amount: Small (1-33%) Fat Layer Exposed: No Angela Patterson, Angela Patterson ([id number]) Necrotic Quality: Eschar Tendon Exposed: No Muscle Exposed: No Joint Exposed: No Bone Exposed: No Limited to Skin Breakdown Periwound Skin Texture Texture Color No Abnormalities Noted: No No Abnormalities Noted: No Callus: Yes Atrophie Blanche: No Crepitus: No Cyanosis: No Excoriation: No Ecchymosis: No Fluctuance: No Erythema: No Friable: No Hemosiderin Staining: No Induration: No Mottled: No Localized Edema: No Pallor: No Rash: No Rubor: No Scarring: No Temperature / Pain Moisture Temperature: No Abnormality No Abnormalities Noted: No Dry / Scaly: Yes Maceration: No Moist: Yes Wound Preparation Ulcer Cleansing: Rinsed/Irrigated with Saline Topical Anesthetic Applied: Other: lidocaine 4%, Treatment Notes Wound #3 (Left, Plantar Metatarsal head fifth) 1. Cleansed with: Clean wound with Normal Saline 4. Dressing Applied: Aquacel Ag 5. Secondary Dressing Applied Foam Kerlix/Conform 6. Footwear/Offloading device applied Other footwear/offloading device applied (specify in notes) Notes darco front Dietitian) Signed: 08/06/2015 5:00:07 PM By: Regan Lemming BSN, RN Entered By: Regan Lemming on 08/06/2015 15:54:52 Angela Patterson ([name]  ([id number]) -------------------------------------------------------------------------------- Vitals Details Patient Name: Angela Patterson. Date of Service: 08/06/2015 3:45 PM Medical Record Number: [id number] Patient Account Number: [id number] Date of Birth/Sex: [birth year] (82 y.o. Female) Treating RN: Afful, RN, BSN, Velva Harman Primary Care Physician: Einar Pheasant Other Clinician: Referring Physician: Einar Pheasant Treating Physician/Extender: Frann Rider in Treatment: 1 Vital Signs Time Taken: 15:51 Temperature (F): 97.3 Height (in): 65 Pulse (bpm): 72 Weight (lbs): 107 Respiratory Rate (breaths/min): 20 Body Mass Index (BMI): 17.8 Blood Pressure (mmHg): 160/80 Reference Range: 80 - 120 mg / dl Electronic Signature(s) Signed: 08/06/2015 5:00:07 PM By: Regan Lemming BSN, RN Entered By: Regan Lemming on 08/06/2015 15:51:29

## 2015-08-09 ENCOUNTER — Ambulatory Visit (INDEPENDENT_AMBULATORY_CARE_PROVIDER_SITE_OTHER): Payer: Medicare Other | Admitting: Sports Medicine

## 2015-08-09 ENCOUNTER — Encounter: Payer: Self-pay | Admitting: Sports Medicine

## 2015-08-09 VITALS — BP 138/72 | HR 80 | Resp 16

## 2015-08-09 DIAGNOSIS — E1142 Type 2 diabetes mellitus with diabetic polyneuropathy: Secondary | ICD-10-CM

## 2015-08-09 DIAGNOSIS — M79672 Pain in left foot: Secondary | ICD-10-CM | POA: Diagnosis not present

## 2015-08-09 DIAGNOSIS — E13621 Other specified diabetes mellitus with foot ulcer: Secondary | ICD-10-CM | POA: Diagnosis not present

## 2015-08-09 DIAGNOSIS — L02619 Cutaneous abscess of unspecified foot: Secondary | ICD-10-CM | POA: Diagnosis not present

## 2015-08-09 DIAGNOSIS — M79671 Pain in right foot: Secondary | ICD-10-CM

## 2015-08-09 DIAGNOSIS — B351 Tinea unguium: Secondary | ICD-10-CM

## 2015-08-09 DIAGNOSIS — L97509 Non-pressure chronic ulcer of other part of unspecified foot with unspecified severity: Secondary | ICD-10-CM | POA: Diagnosis not present

## 2015-08-09 DIAGNOSIS — L03119 Cellulitis of unspecified part of limb: Secondary | ICD-10-CM

## 2015-08-09 MED ORDER — AMOXICILLIN-POT CLAVULANATE 875-125 MG PO TABS: 1.0000 | ORAL_TABLET | Freq: Two times a day (BID) | ORAL | Status: DC

## 2015-08-09 NOTE — Progress Notes (Signed)
Subjective:    Patient ID: Angela Patterson, female    DOB: 26-Sep-1920, 82 y.o.   MRN: [id number]  HPI Angela Patterson is a 82 y.o. female patient seen in office for nail care referred by Dr. Nicki Reaper. Patient also has an ulceration to the left foot that is being treated by [facility]. Patient is assisted by daughter who states that wound has been present 2 years and has opened up and healed twice. Patient has a history of diabetes and a blood glucose level  today that wasn't recorded but is "good". Patient has daughter changing the dressing every other day when she is not at wound center using aquacel. Denies nausea/fever/vomiting/chills/night sweats/shortness of breath/pain. Patient has no other pedal complaints at this time.  Patient Active Problem List   Diagnosis Date Noted  . Open wound of foot excluding toes without complication 56/21/3086  . Health care maintenance 10/27/2014  . Weight loss 12/27/2013  . Thrombocytopenia ([place]) 11/20/2012  . Hypertension 07/21/2012  . Hypercholesterolemia 07/21/2012  . History of CVA (cerebrovascular accident) 07/21/2012  . Diabetes mellitus ([place]) 07/21/2012  . Cerebral artery occlusion with cerebral infarction ([place]) 07/21/2012  . Essential (primary) hypertension 07/21/2012  . Pure hypercholesterolemia 07/21/2012  . Type 2 diabetes mellitus ([place]) 07/21/2012   Current Outpatient Prescriptions on File Prior to Visit  Medication Sig Dispense Refill  . amLODipine (NORVASC) 5 MG tablet Take 1 tablet by mouth two  times daily 180 tablet 1  . clopidogrel (PLAVIX) 75 MG tablet Take 1 tablet by mouth  every day 90 tablet 3  . Lancet Devices (ONE TOUCH DELICA LANCING DEV) MISC Use twice daily Dx: 250.00 100 each 5  . metFORMIN (GLUCOPHAGE) 500 MG tablet Take 1 tablet by mouth  twice a day as directed 180 tablet 1  . Multiple Vitamin (MULTIVITAMIN) tablet Take 1 tablet by mouth daily.    . ONE TOUCH ULTRA TEST test strip TEST BG TWICE DAILY 100 each 3  .  timolol (BETIMOL) 0.5 % ophthalmic solution 1 drop 2 (two) times daily.     No current facility-administered medications on file prior to visit.   Allergies  Allergen Reactions  . Actos [Pioglitazone] Swelling  . Contrast Media [Iodinated Diagnostic Agents] Swelling  . Prandin [Repaglinide] Swelling  . Pravastatin Sodium     cramps    Review of Systems  Gastrointestinal: Positive for constipation.  Skin: Positive for wound.  Hematological: Bruises/bleeds easily.  All other systems reviewed and are negative.      Objective:   Physical Exam  Objective: Vitals: Reviewed  General: Patient is awake, alert, oriented x 3 and in no acute distress.  Dermatology: Nails x 10 severely thickened and dystrophic with subungal debris consistent with onychomycosis. Skin is warm and dry bilateral with a full thickness ulceration present left sub met 1. Ulceration measures 1 cm x 1.8cm x 0.4cm. There is a  Mildly keratotic border with dry heme from recent debridement with a granular base. The ulceration does not  probe to bone. There is no malodor, + serous active drainage, mild erythema, mild edema and warmth. No other acute signs of infection.   Vascular: Dorsalis Pedis pulse = 1/4 Bilateral,  Posterior Tibial pulse = 1/4 Bilateral,  Capillary Fill Time < 5 seconds  Neurologic: Epicritic sensation absent to the level of ankle using the 5.07/10g Semmes Weinstein Monofilament bilateral.  Musculosketal:No Pain with palpation to ulcerated area. No pain with compression to calves bilateral. Significant bunion L>R and  lesser hammertoe bony deformities noted bilateral.  Xray 07-30-15 CLINICAL DATA: Nonhealing wound.  EXAM: LEFT FOOT - COMPLETE 3+ VIEW  COMPARISON: MRI 10/09/2014.  FINDINGS: Diffuse osteopenia and degenerative change. No acute bony abnormality identified. A a wound with a bandage is noted over the plantar aspect of the distal foot. No adjacent acute bony abnormality. If  osteomyelitis is of concern MRI can be obtained.  IMPRESSION: Soft tissue wound with a bandage is noted over the plantar aspect of the distal left foot. Diffuse osteopenia degenerative change. No acute bony abnormality.    Assessment & Plan:   Problem List Items Addressed This Visit    None    Visit Diagnoses    Cellulitis and abscess of foot, except toes    -  Primary    Relevant Medications    amoxicillin-clavulanate (AUGMENTIN) 875-125 MG tablet    Foot ulcer due to secondary DM (HCC)        Left sub met 1    Relevant Medications    amoxicillin-clavulanate (AUGMENTIN) 875-125 MG tablet    Diabetic polyneuropathy associated with type 2 diabetes mellitus (HCC)        Relevant Medications    amoxicillin-clavulanate (AUGMENTIN) 875-125 MG tablet    Dermatophytosis of nail        Relevant Medications    mupirocin cream (BACTROBAN) 2 %    Foot pain, bilateral          -Patient seen and evaluated -Mechanically debrided nails x 10 using sterile nail nipper and dremel without complication. -Xrays reviewed. Discussed the progression of the wound and treatment alternatives.Patient to continue with follow up with wound care center for left foot ulcer wound care. -Applied offloading pad and dry sterile dressing and instructed patient to continue with daily dressings at home consisting of Aquacel and dry sterile dressing as instructed by wound care center. -Due to concern for infection started patient on 10 day course of Augmentin 853m bid -Cont with post op shoe on left - Advised patient to go to the ER or return to office if the wound worsens or if constitutional symptoms are present. -Patient to return to office as scheduled in 3 months for routine foot care or sooner if problems arise. Advised daughter if wound is not healing with care at wound care center that she and her mother can return to me for care for ulceration; Daughter expressed understanding and stated that they will  continue for now with wound care at wound care center.   TLandis Martins DPM

## 2015-08-13 ENCOUNTER — Encounter: Payer: Medicare Other | Admitting: Surgery

## 2015-08-13 DIAGNOSIS — E11621 Type 2 diabetes mellitus with foot ulcer: Secondary | ICD-10-CM | POA: Diagnosis not present

## 2015-08-13 DIAGNOSIS — I10 Essential (primary) hypertension: Secondary | ICD-10-CM | POA: Diagnosis not present

## 2015-08-13 DIAGNOSIS — E1140 Type 2 diabetes mellitus with diabetic neuropathy, unspecified: Secondary | ICD-10-CM | POA: Diagnosis not present

## 2015-08-13 DIAGNOSIS — Z8673 Personal history of transient ischemic attack (TIA), and cerebral infarction without residual deficits: Secondary | ICD-10-CM | POA: Diagnosis not present

## 2015-08-13 DIAGNOSIS — L97522 Non-pressure chronic ulcer of other part of left foot with fat layer exposed: Secondary | ICD-10-CM | POA: Diagnosis not present

## 2015-08-13 DIAGNOSIS — L84 Corns and callosities: Secondary | ICD-10-CM | POA: Diagnosis not present

## 2015-08-13 NOTE — Progress Notes (Signed)
Angela Patterson ([id number]) Visit Report for 08/13/2015 Arrival Information Details Patient Name: Angela Patterson, Angela Patterson. Date of Service: 08/13/2015 10:00 AM Medical Record Number: [id number] Patient Account Number: [id number] Date of Birth/Sex: 10-11-1920 (82 y.o. Female) Treating RN: Afful, RN, BSN, Velva Harman Primary Care Physician: Einar Pheasant Other Clinician: Referring Physician: Einar Pheasant Treating Physician/Extender: Frann Rider in Treatment: 2 Visit Information History Since Last Visit Any new allergies or adverse reactions: No Patient Arrived: Ambulatory Had a fall or experienced change in No Arrival Time: 09:56 activities of daily living that may affect Accompanied By: grddtr risk of falls: Transfer Assistance: None Signs or symptoms of abuse/neglect since last No Patient Identification Verified: Yes visito Secondary Verification Process Yes Hospitalized since last visit: No Completed: Has Dressing in Place as Prescribed: Yes Patient Requires Transmission-Based No Pain Present Now: No Precautions: Patient Has Alerts: No Electronic Signature(s) Signed: 08/13/2015 1:00:56 PM By: Regan Lemming BSN, RN Entered By: Regan Lemming on 08/13/2015 09:57:51 Angela Patterson ([id number]) -------------------------------------------------------------------------------- Encounter Discharge Information Details Patient Name: Angela Patterson. Date of Service: 08/13/2015 10:00 AM Medical Record Number: [id number] Patient Account Number: [id number] Date of Birth/Sex: 1921/06/05 (82 y.o. Female) Treating RN: Baruch Gouty, RN, BSN, Velva Harman Primary Care Physician: Einar Pheasant Other Clinician: Referring Physician: Einar Pheasant Treating Physician/Extender: Frann Rider in Treatment: 2 Encounter Discharge Information Items Schedule Follow-up Appointment: No Medication Reconciliation completed No and provided to Patient/Care Surena Welge: Provided on Clinical Summary of  Care: 08/13/2015 Form Type Recipient Paper Patient EG Electronic Signature(s) Signed: 08/13/2015 10:18:05 AM By: Ruthine Dose Entered By: Ruthine Dose on 08/13/2015 10:18:05 Angela Patterson ([id number]) -------------------------------------------------------------------------------- Lower Extremity Assessment Details Patient Name: Angela Patterson. Date of Service: 08/13/2015 10:00 AM Medical Record Number: [id number] Patient Account Number: [id number] Date of Birth/Sex: [birth year] (82 y.o. Female) Treating RN: Afful, RN, BSN, Velva Harman Primary Care Physician: Einar Pheasant Other Clinician: Referring Physician: Einar Pheasant Treating Physician/Extender: Frann Rider in Treatment: 2 Vascular Assessment Pulses: Popliteal Palpable: [Left:No] Posterior Tibial Dorsalis Pedis Palpable: [Left:Yes] Extremity colors, hair growth, and conditions: Extremity Color: [Left:Normal] Hair Growth on Extremity: [Left:Yes] Temperature of Extremity: [Left:Warm] Capillary Refill: [Left:< 3 seconds] Toe Nail Assessment Left: Right: Thick: No Discolored: No Deformed: No Improper Length and Hygiene: No Electronic Signature(s) Signed: 08/13/2015 1:00:56 PM By: Regan Lemming BSN, RN Entered By: Regan Lemming on 08/13/2015 09:59:24 Angela Patterson ([id number]) -------------------------------------------------------------------------------- Multi Wound Chart Details Patient Name: Angela Patterson. Date of Service: 08/13/2015 10:00 AM Medical Record Number: [id number] Patient Account Number: [id number] Date of Birth/Sex: Jan 03, 1921 (82 y.o. Female) Treating RN: Baruch Gouty, RN, BSN, Velva Harman Primary Care Physician: Einar Pheasant Other Clinician: Referring Physician: Einar Pheasant Treating Physician/Extender: Frann Rider in Treatment: 2 Vital Signs Height(in): 65 Pulse(bpm): 66 Weight(lbs): 107 Blood Pressure 158/48 (mmHg): Body Mass Index(BMI): 18 Temperature(F): 97.5 Respiratory  Rate 20 (breaths/min): Photos: [3:No Photos] [N/A:N/A] Wound Location: [3:Left Metatarsal head fifth - N/A Plantar] Wounding Event: [3:Gradually Appeared] [N/A:N/A] Primary Etiology: [3:Diabetic Wound/Ulcer of N/A the Lower Extremity] Comorbid History: [3:Cataracts, Glaucoma, Hypertension, Type II Diabetes, History of pressure wounds, Neuropathy] [N/A:N/A] Date Acquired: [3:07/01/2015] [N/A:N/A] Weeks of Treatment: [3:2] [N/A:N/A] Wound Status: [3:Open] [N/A:N/A] Measurements L x W x D 1x1x0.2 [N/A:N/A] (cm) Area (cm) : [3:0.785] [N/A:N/A] Volume (cm) : [3:0.157] [N/A:N/A] % Reduction in Area: [3:83.30%] [N/A:N/A] % Reduction in Volume: 83.30% [N/A:N/A] Classification: [3:Grade 1] [N/A:N/A] Exudate Amount: [3:Small] [N/A:N/A] Exudate Type: [3:Serous] [N/A:N/A] Exudate Color: [3:amber] [N/A:N/A] Wound Margin: [3:Distinct, outline attached N/A] Granulation Amount: [3:Large (67-100%)] [N/A:N/A] Granulation Quality: [3:Pink,  Pale] [N/A:N/A] Necrotic Amount: [3:Small (1-33%)] [N/A:N/A] Necrotic Tissue: [3:Eschar] [N/A:N/A] Exposed Structures: [N/A:N/A] Fascia: No Fat: No Tendon: No Muscle: No Joint: No Bone: No Limited to Skin Breakdown Epithelialization: None N/A N/A Periwound Skin Texture: Callus: Yes N/A N/A Edema: No Excoriation: No Induration: No Crepitus: No Fluctuance: No Friable: No Rash: No Scarring: No Periwound Skin Moist: Yes N/A N/A Moisture: Maceration: No Dry/Scaly: No Periwound Skin Color: Atrophie Blanche: No N/A N/A Cyanosis: No Ecchymosis: No Erythema: No Hemosiderin Staining: No Mottled: No Pallor: No Rubor: No Temperature: No Abnormality N/A N/A Tenderness on No N/A N/A Palpation: Wound Preparation: Ulcer Cleansing: N/A N/A Rinsed/Irrigated with Saline Topical Anesthetic Applied: Other: lidocaine 4% Treatment Notes Electronic Signature(s) Signed: 08/13/2015 1:00:56 PM By: Regan Lemming BSN, RN Entered By: Regan Lemming on 08/13/2015  10:07:05 Angela Patterson ([id number]) -------------------------------------------------------------------------------- [place] Details Patient Name: Angela Patterson. Date of Service: 08/13/2015 10:00 AM Medical Record Number: [id number] Patient Account Number: [id number] Date of Birth/Sex: 1920-12-16 (82 y.o. Female) Treating RN: Afful, RN, BSN, Velva Harman Primary Care Physician: Einar Pheasant Other Clinician: Referring Physician: Einar Pheasant Treating Physician/Extender: Frann Rider in Treatment: 2 Active Inactive HBO Nursing Diagnoses: Anxiety related to feelings of confinement associated with the hyperbaric oxygen chamber Anxiety related to knowledge deficit of hyperbaric oxygen therapy and treatment procedures Discomfort related to temperature and humidity changes inside hyperbaric chamber Potential for barotraumas to ears, sinuses, teeth, and lungs or cerebral gas embolism related to changes in atmospheric pressure inside hyperbaric oxygen chamber Potential for oxygen toxicity seizures related to delivery of 100% oxygen at an increased atmospheric pressure Potential for pulmonary oxygen toxicity related to delivery of 100% oxygen at an increased atmospheric pressure Goals: Barotrauma will be prevented during HBO2 Date Initiated: 07/30/2015 Goal Status: Active Patient and/or family will be able to state/discuss factors appropriate to the management of their disease process during treatment Date Initiated: 07/30/2015 Goal Status: Active Patient will tolerate the hyperbaric oxygen therapy treatment Date Initiated: 07/30/2015 Goal Status: Active Patient will tolerate the internal climate of the chamber Date Initiated: 07/30/2015 Goal Status: Active Patient/caregiver will verbalize understanding of HBO goals, rationale, procedures and potential hazards Date Initiated: 07/30/2015 Goal Status: Active Signs and symptoms of pulmonary oxygen toxicity will be  recognized and promptly addressed Date Initiated: 07/30/2015 Goal Status: Active Signs and symptoms of seizure will be recognized and promptly addressed ; seizing patients will suffer no harm Date Initiated: 07/30/2015 Angela Patterson ([id number]) Goal Status: Active Interventions: Administer a five (5) minute air break for patient if signs and symptoms of seizure appear and notify the hyperbaric physician Administer a ten (10) minute air break for patient if signs and symptoms of seizure appear and notify the hyperbaric physician Administer decongestants, per physician orders, prior to HBO2 Administer the correct therapeutic gas delivery based on the patients needs and limitations, per physician order Assess and provide for patientos comfort related to the hyperbaric environment and equalization of middle ear Assess for signs and symptoms related to adverse events, including but not limited to confinement anxiety, pneumothorax, oxygen toxicity and baurotrauma Assess patient for any history of confinement anxiety Assess patient's knowledge and expectations regarding hyperbaric medicine and provide education related to the hyperbaric environment, goals of treatment and prevention of adverse events Implement protocols to decrease risk of pneumothorax in high risk patients Notes: Orientation to the Wound Care Program Nursing Diagnoses: Knowledge deficit related to the wound healing center program Goals: Patient/caregiver will verbalize understanding of the [facility]  Program Date Initiated: 07/30/2015 Goal Status: Active Interventions: Provide education on orientation to the wound center Notes: Wound/Skin Impairment Nursing Diagnoses: Impaired tissue integrity Knowledge deficit related to smoking impact on wound healing Goals: Patient will have a decrease in wound volume by X% from date: (specify in notes) Date Initiated: 07/30/2015 Goal Status: Active Patient/caregiver  will verbalize understanding of skin care regimen LAMECA, KOLAKOWSKI ([id number]) Date Initiated: 07/30/2015 Goal Status: Active Ulcer/skin breakdown will have a volume reduction of 30% by week 4 Date Initiated: 07/30/2015 Goal Status: Active Ulcer/skin breakdown will have a volume reduction of 50% by week 8 Date Initiated: 07/30/2015 Goal Status: Active Ulcer/skin breakdown will have a volume reduction of 80% by week 12 Date Initiated: 07/30/2015 Goal Status: Active Ulcer/skin breakdown will heal within 14 weeks Date Initiated: 07/30/2015 Goal Status: Active Interventions: Assess patient/caregiver ability to obtain necessary supplies Assess patient/caregiver ability to perform ulcer/skin care regimen upon admission and as needed Provide education on ulcer and skin care Notes: Electronic Signature(s) Signed: 08/13/2015 1:00:56 PM By: Regan Lemming BSN, RN Entered By: Regan Lemming on 08/13/2015 10:04:10 Angela Patterson ([id number]) -------------------------------------------------------------------------------- Pain Assessment Details Patient Name: Angela Patterson. Date of Service: 08/13/2015 10:00 AM Medical Record Number: [id number] Patient Account Number: [id number] Date of Birth/Sex: 08/24/1920 (82 y.o. Female) Treating RN: Baruch Gouty, RN, BSN, Velva Harman Primary Care Physician: Einar Pheasant Other Clinician: Referring Physician: Einar Pheasant Treating Physician/Extender: Frann Rider in Treatment: 2 Active Problems Location of Pain Severity and Description of Pain Patient Has Paino No Site Locations Pain Management and Medication Current Pain Management: Electronic Signature(s) Signed: 08/13/2015 1:00:56 PM By: Regan Lemming BSN, RN Entered By: Regan Lemming on 08/13/2015 09:58:05 Angela Patterson ([id number]) -------------------------------------------------------------------------------- Wound Assessment Details Patient Name: Angela Patterson. Date of Service: 08/13/2015 10:00 AM Medical  Record Number: [id number] Patient Account Number: [id number] Date of Birth/Sex: 1921/06/18 (82 y.o. Female) Treating RN: Afful, RN, BSN, Velva Harman Primary Care Physician: Einar Pheasant Other Clinician: Referring Physician: Einar Pheasant Treating Physician/Extender: Frann Rider in Treatment: 2 Wound Status Wound Number: 3 Primary Diabetic Wound/Ulcer of the Lower Etiology: Extremity Wound Location: Left Metatarsal head fifth - Plantar Wound Open Status: Wounding Event: Gradually Appeared Comorbid Cataracts, Glaucoma, Hypertension, Date Acquired: 07/01/2015 History: Type II Diabetes, History of pressure Weeks Of Treatment: 2 wounds, Neuropathy Clustered Wound: No Photos Photo Uploaded By: Regan Lemming on 08/13/2015 12:59:56 Wound Measurements Length: (cm) 1 Width: (cm) 1 Depth: (cm) 0.2 Area: (cm) 0.785 Volume: (cm) 0.157 % Reduction in Area: 83.3% % Reduction in Volume: 83.3% Epithelialization: None Tunneling: No Undermining: No Wound Description Classification: Grade 1 Wound Margin: Distinct, outline attached Exudate Amount: Small Exudate Type: Serous Exudate Color: amber Foul Odor After Cleansing: No Wound Bed Granulation Amount: Large (67-100%) Exposed Structure Granulation Quality: Pink, Pale Fascia Exposed: No Necrotic Amount: Small (1-33%) Fat Layer Exposed: No LINDITA, JUNGHANS ([id number]) Necrotic Quality: Eschar Tendon Exposed: No Muscle Exposed: No Joint Exposed: No Bone Exposed: No Limited to Skin Breakdown Periwound Skin Texture Texture Color No Abnormalities Noted: No No Abnormalities Noted: No Callus: Yes Atrophie Blanche: No Crepitus: No Cyanosis: No Excoriation: No Ecchymosis: No Fluctuance: No Erythema: No Friable: No Hemosiderin Staining: No Induration: No Mottled: No Localized Edema: No Pallor: No Rash: No Rubor: No Scarring: No Temperature / Pain Moisture Temperature: No Abnormality No Abnormalities Noted: No Dry /  Scaly: No Maceration: No Moist: Yes Wound Preparation Ulcer Cleansing: Rinsed/Irrigated with Saline Topical Anesthetic Applied: Other: lidocaine 4%, Treatment Notes Wound #3 (Left, Plantar  Metatarsal head fifth) 1. Cleansed with: Clean wound with Normal Saline 4. Dressing Applied: Aquacel Ag 5. Secondary Dressing Applied Bordered Foam Dressing 6. Footwear/Offloading device applied Other footwear/offloading device applied (specify in notes) Notes darco front Dietitian) Signed: 08/13/2015 1:00:56 PM By: Regan Lemming BSN, RN Entered By: Regan Lemming on 08/13/2015 10:04:03 Angela Patterson ([id number]) -------------------------------------------------------------------------------- Vitals Details Patient Name: Angela Patterson. Date of Service: 08/13/2015 10:00 AM Medical Record Number: [id number] Patient Account Number: [id number] Date of Birth/Sex: 11/12/1920 (82 y.o. Female) Treating RN: Afful, RN, BSN, Velva Harman Primary Care Physician: Einar Pheasant Other Clinician: Referring Physician: Einar Pheasant Treating Physician/Extender: Frann Rider in Treatment: 2 Vital Signs Time Taken: 09:58 Temperature (F): 97.5 Height (in): 65 Pulse (bpm): 66 Weight (lbs): 107 Respiratory Rate (breaths/min): 20 Body Mass Index (BMI): 17.8 Blood Pressure (mmHg): 158/48 Reference Range: 80 - 120 mg / dl Electronic Signature(s) Signed: 08/13/2015 1:00:56 PM By: Regan Lemming BSN, RN Entered By: Regan Lemming on 08/13/2015 09:58:30

## 2015-08-13 NOTE — Progress Notes (Signed)
MALLEY, ESCOBAR ([id number]) Visit Report for 08/13/2015 Chief Complaint Document Details Patient Name: Angela Patterson, Angela Patterson 08/13/2015 10:00 Date of Service: AM Medical Record [id number] Number: Patient Account Number: [id number] 02/02/1921 (82 y.o. Treating RN: Afful, RN, BSN, Velva Harman Date of Birth/Sex: Female) Other Clinician: Primary Care Physician: Einar Pheasant Treating Shariya Gaster Referring Physician: Einar Pheasant Physician/Extender: Weeks in Treatment: 2 Information Obtained from: Patient Chief Complaint Patient presents to the wound care center for a consult due non healing wound Pleasant 82 year old comes along with her daughter with a recurrent ulceration and callosity on her left foot. she has had this for about 3 weeks now. Electronic Signature(s) Signed: 08/13/2015 10:21:11 AM By: Christin Fudge MD, FACS Entered By: Christin Fudge on 08/13/2015 10:21:11 [name] ([id number]) -------------------------------------------------------------------------------- Debridement Details Patient Name: Angela Patterson 08/13/2015 10:00 Date of Service: AM Medical Record [id number] Number: Patient Account Number: [id number] 03/25/1921 (82 y.o. Treating RN: Afful, RN, BSN, Velva Harman Date of Birth/Sex: Female) Other Clinician: Primary Care Physician: Einar Pheasant Treating Alcee Sipos Referring Physician: Einar Pheasant Physician/Extender: Weeks in Treatment: 2 Debridement Performed for Wound #3 Left,Plantar Metatarsal head fifth Assessment: Performed By: Physician Christin Fudge, MD Debridement: Debridement Pre-procedure Yes Verification/Time Out Taken: Start Time: 10:07 Pain Control: Lidocaine 4% Topical Solution Level: Skin/Subcutaneous Tissue Total Area Debrided (L x 1 (cm) x 1 (cm) = 1 (cm) W): Tissue and other Viable, Non-Viable, Callus, Fibrin/Slough, Skin, Subcutaneous material debrided: Instrument: Curette Bleeding: Moderate Hemostasis Achieved: Silver  Nitrate End Time: 10:12 Procedural Pain: 0 Post Procedural Pain: 0 Response to Treatment: Procedure was tolerated well Post Debridement Measurements of Total Wound Length: (cm) 1 Width: (cm) 1 Depth: (cm) 0.2 Volume: (cm) 0.157 Post Procedure Diagnosis Same as Pre-procedure Electronic Signature(s) Signed: 08/13/2015 10:21:05 AM By: Christin Fudge MD, FACS Signed: 08/13/2015 1:00:56 PM By: Regan Lemming BSN, RN Entered By: Christin Fudge on 08/13/2015 10:21:05 [name] ([id number]) -------------------------------------------------------------------------------- HPI Details Patient Name: Angela Patterson. 08/13/2015 10:00 Date of Service: AM Medical Record [id number] Number: Patient Account Number: [id number] December 20, 1920 (82 y.o. Treating RN: Baruch Gouty, RN, BSN, Velva Harman Date of Birth/Sex: Female) Other Clinician: Primary Care Physician: Einar Pheasant Treating Aretha Levi Referring Physician: Einar Pheasant Physician/Extender: Weeks in Treatment: 2 History of Present Illness Location: left foot Quality: mild pain occasionally. Severity: the problem is been getting worse for the last 3 weeks Duration: she's had this for almost a year now on and off. Timing: pain is intermittent and occasional. Context: she was seen by me earlier in March and again in May and was healed by early June 2016 Modifying Factors: she has had diabetic shoes made and is compliant with wearing these. Associated Signs and Symptoms: worse on standing on her feet for too long. HPI Description: this very pleasant 82 year old patient who is extremely active and ambulating all day has had problems with the left foot for over a year. Her daughter who is her caregiver says she drives around and even goes around and does her chores by herself. this is the third recurrence and after the last time we saw in June and discharged her her daughter has got her to pace of diabetic shoes and she's been wearing these  regularly. She has been on the shoes for about 6 months now. Her diabetes is under control and her blood sugars run from a anywhere between 110 to 130 and she checks them twice a day. no fever or discharge. During her initial visit, an x-ray of her left foot was done on  09/26/2014. There was no cortical erosion noted there was mild osteopenia in the distal first metatarsal. Radiologist has recommended a MRI of the left foot to exclude osteomyelitis. MRI done on 10/09/2014 reveals that the area where she had a phlegmon is without osteomyelitis. the bone marrow and other areas are compatible with reactive edema rather than osteomyelitis. 08/06/2015 -- x-ray of the left foot done on 07/30/2015 shows IMPRESSION:Soft tissue wound with a bandage is noted over the plantar aspect of the distal left foot. Diffuse osteopenia degenerative change. No acute bony abnormality Electronic Signature(s) Signed: 08/13/2015 10:21:16 AM By: Christin Fudge MD, FACS Entered By: Christin Fudge on 08/13/2015 10:21:16 [name] ([id number]) -------------------------------------------------------------------------------- Physical Exam Details Patient Name: Angela Patterson 08/13/2015 10:00 Date of Service: AM Medical Record [id number] Number: Patient Account Number: [id number] 06-Aug-1920 (82 y.o. Treating RN: Baruch Gouty, RN, BSN, Velva Harman Date of Birth/Sex: Female) Other Clinician: Primary Care Physician: Einar Pheasant Treating Madolin Twaddle Referring Physician: Einar Pheasant Physician/Extender: Weeks in Treatment: 2 Constitutional . Pulse regular. Respirations normal and unlabored. Afebrile. . Eyes Nonicteric. Reactive to light. Ears, Nose, Mouth, and Throat Lips, teeth, and gums WNL.Marland Kitchen Moist mucosa without lesions. Neck supple and nontender. No palpable supraclavicular or cervical adenopathy. Normal sized without goiter. Respiratory WNL. No retractions.. Cardiovascular Pedal Pulses WNL. No clubbing,  cyanosis or edema. Gastrointestinal (GI) Abdomen without masses or tenderness.. No liver or spleen enlargement or tenderness.. Lymphatic No adneopathy. No adenopathy. No adenopathy. Musculoskeletal Adexa without tenderness or enlargement.. Digits and nails w/o clubbing, cyanosis, infection, petechiae, ischemia, or inflammatory conditions.. Integumentary (Hair, Skin) No suspicious lesions. No crepitus or fluctuance. No peri-wound warmth or erythema. No masses.Marland Kitchen Psychiatric Judgement and insight Intact.. No evidence of depression, anxiety, or agitation.. Notes she continues to have some callus buildup at the edges and this was sharply debrided down to the subcutaneous tissue with a curette. Electronic Signature(s) Signed: 08/13/2015 10:21:48 AM By: Christin Fudge MD, FACS Entered By: Christin Fudge on 08/13/2015 10:21:47 [name] ([id number]) -------------------------------------------------------------------------------- Physician Orders Details Patient Name: CHAYLEN, FALCK 08/13/2015 10:00 Date of Service: AM Medical Record [id number] Number: Patient Account Number: [id number] [birth year] (82 y.o. Treating RN: Afful, RN, BSN, Velva Harman Date of Birth/Sex: Female) Other Clinician: Primary Care Physician: Einar Pheasant Treating Otillia Cordone Referring Physician: Einar Pheasant Physician/Extender: Suella Grove in Treatment: 2 Verbal / Phone Orders: Yes Clinician: Afful, RN, BSN, Rita Read Back and Verified: Yes Diagnosis Coding Wound Cleansing Wound #3 Left,Plantar Metatarsal head fifth o Cleanse wound with mild soap and water o May Shower, gently pat wound dry prior to applying new dressing. Anesthetic Wound #3 Left,Plantar Metatarsal head fifth o Topical Lidocaine 4% cream applied to wound bed prior to debridement Primary Wound Dressing Wound #3 Left,Plantar Metatarsal head fifth o Aquacel Ag Secondary Dressing Wound #3 Left,Plantar Metatarsal head fifth o Boardered  Foam Dressing Dressing Change Frequency Wound #3 Left,Plantar Metatarsal head fifth o Change dressing every other day. Follow-up Appointments Wound #3 Left,Plantar Metatarsal head fifth o Return Appointment in 1 week. Off-Loading Wound #3 Left,Plantar Metatarsal head fifth o Open toe surgical shoe to: - front offloader darco Additional Orders / Instructions Wound #3 Left,Plantar Metatarsal head fifth o Increase protein intake. o Activity as tolerated JOSSELYNN, FEESE ([id number]) o Other: - Keep blood sugars under control Electronic Signature(s) Signed: 08/13/2015 1:00:56 PM By: Regan Lemming BSN, RN Signed: 08/13/2015 2:32:13 PM By: Christin Fudge MD, FACS Entered By: Regan Lemming on 08/13/2015 10:12:25 [name] ([id number]) -------------------------------------------------------------------------------- Problem List Details Patient Name: Virdia, Sabater  Sissy M. 08/13/2015 10:00 Date of Service: AM Medical Record [id number] Number: Patient Account Number: [id number] August 08, 1920 (82 y.o. Treating RN: Afful, RN, BSN, Velva Harman Date of Birth/Sex: Female) Other Clinician: Primary Care Physician: Einar Pheasant Treating Christin Fudge Referring Physician: Einar Pheasant Physician/Extender: Weeks in Treatment: 2 Active Problems ICD-10 Encounter Code Description Active Date Diagnosis E11.621 Type 2 diabetes mellitus with foot ulcer 07/30/2015 Yes L97.522 Non-pressure chronic ulcer of other part of left foot with fat 07/30/2015 Yes layer exposed L84 Corns and callosities 07/30/2015 Yes Inactive Problems Resolved Problems Electronic Signature(s) Signed: 08/13/2015 10:20:51 AM By: Christin Fudge MD, FACS Entered By: Christin Fudge on 08/13/2015 10:20:51 [name] ([id number]) -------------------------------------------------------------------------------- Progress Note Details Patient Name: [name] 08/13/2015 10:00 Date of Service: AM Medical  Record [id number] Number: Patient Account Number: [id number] 1920-08-29 (82 y.o. Treating RN: Baruch Gouty, RN, BSN, Velva Harman Date of Birth/Sex: Female) Other Clinician: Primary Care Physician: Einar Pheasant Treating Wilsie Kern Referring Physician: Einar Pheasant Physician/Extender: Weeks in Treatment: 2 Subjective Chief Complaint Information obtained from Patient Patient presents to the wound care center for a consult due non healing wound Pleasant 82 year old comes along with her daughter with a recurrent ulceration and callosity on her left foot. she has had this for about 3 weeks now. History of Present Illness (HPI) The following HPI elements were documented for the patient's wound: Location: left foot Quality: mild pain occasionally. Severity: the problem is been getting worse for the last 3 weeks Duration: she's had this for almost a year now on and off. Timing: pain is intermittent and occasional. Context: she was seen by me earlier in March and again in May and was healed by early June 2016 Modifying Factors: she has had diabetic shoes made and is compliant with wearing these. Associated Signs and Symptoms: worse on standing on her feet for too long. this very pleasant 82 year old patient who is extremely active and ambulating all day has had problems with the left foot for over a year. Her daughter who is her caregiver says she drives around and even goes around and does her chores by herself. this is the third recurrence and after the last time we saw in June and discharged her her daughter has got her to pace of diabetic shoes and she's been wearing these regularly. She has been on the shoes for about 6 months now. Her diabetes is under control and her blood sugars run from a anywhere between 110 to 130 and she checks them twice a day. no fever or discharge. During her initial visit, an x-ray of her left foot was done on 09/26/2014. There was no cortical erosion noted there  was mild osteopenia in the distal first metatarsal. Radiologist has recommended a MRI of the left foot to exclude osteomyelitis. MRI done on 10/09/2014 reveals that the area where she had a phlegmon is without osteomyelitis. the bone marrow and other areas are compatible with reactive edema rather than osteomyelitis. 08/06/2015 -- x-ray of the left foot done on 07/30/2015 shows IMPRESSION:Soft tissue wound with a bandage is noted over the plantar aspect of the distal left foot. Diffuse osteopenia degenerative change. No acute bony abnormality PERLA, EBLE. ([id number]) Objective Constitutional Pulse regular. Respirations normal and unlabored. Afebrile. Vitals Time Taken: 9:58 AM, Height: 65 in, Weight: 107 lbs, BMI: 17.8, Temperature: 97.5 F, Pulse: 66 bpm, Respiratory Rate: 20 breaths/min, Blood Pressure: 158/48 mmHg. Eyes Nonicteric. Reactive to light. Ears, Nose, Mouth, and Throat Lips, teeth, and gums WNL.Marland Kitchen Moist mucosa without lesions.  Neck supple and nontender. No palpable supraclavicular or cervical adenopathy. Normal sized without goiter. Respiratory WNL. No retractions.. Cardiovascular Pedal Pulses WNL. No clubbing, cyanosis or edema. Gastrointestinal (GI) Abdomen without masses or tenderness.. No liver or spleen enlargement or tenderness.. Lymphatic No adneopathy. No adenopathy. No adenopathy. Musculoskeletal Adexa without tenderness or enlargement.. Digits and nails w/o clubbing, cyanosis, infection, petechiae, ischemia, or inflammatory conditions.Marland Kitchen Psychiatric Judgement and insight Intact.. No evidence of depression, anxiety, or agitation.. General Notes: she continues to have some callus buildup at the edges and this was sharply debrided down to the subcutaneous tissue with a curette. Integumentary (Hair, Skin) No suspicious lesions. No crepitus or fluctuance. No peri-wound warmth or erythema. No masses.Marland Kitchen DENESIA, [id number] ([id number]) Wound #3 status is Open.  Original cause of wound was Gradually Appeared. The wound is located on the Left,Plantar Metatarsal head fifth. The wound measures 1cm length x 1cm width x 0.2cm depth; 0.785cm^2 area and 0.157cm^3 volume. The wound is limited to skin breakdown. There is no tunneling or undermining noted. There is a small amount of serous drainage noted. The wound margin is distinct with the outline attached to the wound base. There is large (67-100%) pink, pale granulation within the wound bed. There is a small (1-33%) amount of necrotic tissue within the wound bed including Eschar. The periwound skin appearance exhibited: Callus, Moist. The periwound skin appearance did not exhibit: Crepitus, Excoriation, Fluctuance, Friable, Induration, Localized Edema, Rash, Scarring, Dry/Scaly, Maceration, Atrophie Blanche, Cyanosis, Ecchymosis, Hemosiderin Staining, Mottled, Pallor, Rubor, Erythema. Periwound temperature was noted as No Abnormality. 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 She will also use a Darco front off loading shoe. She will come back to see me every week. Procedures Wound #3 Wound #3 is a Diabetic Wound/Ulcer of the Lower Extremity located on the Left,Plantar Metatarsal head fifth . There was a Skin/Subcutaneous Tissue Debridement [id number]) debridement with total area of 1 sq cm performed by Christin Fudge, MD. with the following instrument(s): Curette to remove Viable and Non- Viable tissue/material including Fibrin/Slough, Skin, Callus, and Subcutaneous after achieving pain control using Lidocaine 4% Topical Solution. A time out was conducted prior to the start of the procedure. A Moderate amount of bleeding was controlled with Silver Nitrate. The procedure was  tolerated well with a pain level of 0 throughout and a pain level of 0 following the procedure. Post Debridement Measurements: 1cm length x 1cm width x 0.2cm depth; 0.157cm^3 volume. Post procedure Diagnosis Wound #3: Same as Pre-Procedure SHARMONIQUE, MILMAN ([id number]) Plan Wound Cleansing: Wound #3 Left,Plantar Metatarsal head fifth: Cleanse wound with mild soap and water May Shower, gently pat wound dry prior to applying new dressing. Anesthetic: Wound #3 Left,Plantar Metatarsal head fifth: Topical Lidocaine 4% cream applied to wound bed prior to debridement Primary Wound Dressing: Wound #3 Left,Plantar Metatarsal head fifth: Aquacel Ag Secondary Dressing: Wound #3 Left,Plantar Metatarsal head fifth: Boardered Foam Dressing Dressing Change Frequency: Wound #3 Left,Plantar Metatarsal head fifth: Change dressing every other day. Follow-up Appointments: Wound #3 Left,Plantar Metatarsal head fifth: Return Appointment in 1 week. Off-Loading: Wound #3 Left,Plantar Metatarsal head fifth: Open toe surgical shoe to: - front offloader darco Additional Orders / Instructions: Wound #3 Left,Plantar Metatarsal head fifth: Increase protein intake. Activity as tolerated Other: - Keep blood sugars under  control I have recommended silver alginate with a offloading felt ring around it. She will also use a Darco front off loading shoe. She will come back to see me every week. Electronic Signature(s) Signed: 08/13/2015 10:22:28 AM By: Christin Fudge MD, FACS Entered By: Christin Fudge on 08/13/2015 10:22:27 [name] ([id number]) -------------------------------------------------------------------------------- SuperBill Details Patient Name: [name] Date of Service: 08/13/2015 Medical Record Patient Account Number: [id number] Number: Afful, RN, BSN, Treating RN: [birth year] [phone] y.o. Velva Harman Date of Birth/Sex: Female) Other Clinician: Primary Care Physician: Einar Pheasant  Treating Merril Isakson Referring Physician: Einar Pheasant Physician/Extender: Suella Grove in Treatment: 2 Diagnosis Coding ICD-10 Codes Code Description E11.621 Type 2 diabetes mellitus with 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 with fat layer exposed L84 Corns and callosities Facility Procedures CPT4 Code Description: [id number] - DEB SUBQ TISSUE 20 SQ CM/< ICD-10 Description Diagnosis E11.621 Type 2 diabetes mellitus with 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 L84 Corns and callosities Modifier: with fat laye Quantity: 1 r exposed Physician Procedures CPT4 Code Description: [id number] - WC PHYS SUBQ TISS 20 SQ CM ICD-10 Description Diagnosis E11.621 Type 2 diabetes mellitus with 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 L84 Corns and callosities Modifier: with fat layer Quantity: 1 exposed Electronic Signature(s) Signed: 08/13/2015 10:22:39 AM By: Christin Fudge MD, FACS Entered By: Christin Fudge on 08/13/2015 10:22:38

## 2015-08-20 ENCOUNTER — Encounter: Payer: Medicare Other | Admitting: Surgery

## 2015-08-20 DIAGNOSIS — E11621 Type 2 diabetes mellitus with foot ulcer: Secondary | ICD-10-CM | POA: Diagnosis not present

## 2015-08-20 DIAGNOSIS — L97521 Non-pressure chronic ulcer of other part of left foot limited to breakdown of skin: Secondary | ICD-10-CM | POA: Diagnosis not present

## 2015-08-20 DIAGNOSIS — I10 Essential (primary) hypertension: Secondary | ICD-10-CM | POA: Diagnosis not present

## 2015-08-20 DIAGNOSIS — E1140 Type 2 diabetes mellitus with diabetic neuropathy, unspecified: Secondary | ICD-10-CM | POA: Diagnosis not present

## 2015-08-20 DIAGNOSIS — L97522 Non-pressure chronic ulcer of other part of left foot with fat layer exposed: Secondary | ICD-10-CM | POA: Diagnosis not present

## 2015-08-20 DIAGNOSIS — L84 Corns and callosities: Secondary | ICD-10-CM | POA: Diagnosis not present

## 2015-08-20 DIAGNOSIS — Z8673 Personal history of transient ischemic attack (TIA), and cerebral infarction without residual deficits: Secondary | ICD-10-CM | POA: Diagnosis not present

## 2015-08-22 NOTE — Progress Notes (Signed)
HERMELA, NEEDLEMAN ([id number]) Visit Report for 08/20/2015 Arrival Information Details Patient Name: Angela Patterson, Angela Patterson. Date of Service: 08/20/2015 12:45 PM Medical Record Number: [id number] Patient Account Number: [id number] Date of Birth/Sex: 06-02-1921 (82 y.o. Female) Treating RN: Carolyne Fiscal, Debi Primary Care Physician: Einar Pheasant Other Clinician: Referring Physician: Einar Pheasant Treating Physician/Extender: Frann Rider in Treatment: 3 Visit Information History Since Last Visit All ordered tests and consults were completed: No Patient Arrived: Ambulatory Added or deleted any medications: No Arrival Time: 13:02 Any new allergies or adverse reactions: No Accompanied By: granddaughter Had a fall or experienced change in No Transfer Assistance: None activities of daily living that may affect Patient Identification Verified: Yes risk of falls: Secondary Verification Process Yes Signs or symptoms of abuse/neglect since last No Completed: visito Patient Requires Transmission- No Hospitalized since last visit: No Based Precautions: Pain Present Now: No Patient Has Alerts: No Electronic Signature(s) Signed: 08/21/2015 5:03:09 PM By: Alric Quan Entered By: Alric Quan on 08/20/2015 13:02:58 Angela Patterson ([id number]) -------------------------------------------------------------------------------- Encounter Discharge Information Details Patient Name: Angela Patterson. Date of Service: 08/20/2015 12:45 PM Medical Record Number: [id number] Patient Account Number: [id number] Date of Birth/Sex: 12-Feb-1921 (82 y.o. Female) Treating RN: Carolyne Fiscal, Debi Primary Care Physician: Einar Pheasant Other Clinician: Referring Physician: Einar Pheasant Treating Physician/Extender: Frann Rider in Treatment: 3 Encounter Discharge Information Items Discharge Pain Level: 0 Discharge Condition: Stable Ambulatory Status: Ambulatory Discharge Destination:  Home Transportation: Private Auto Accompanied By: granddaughter Schedule Follow-up Appointment: Yes Medication Reconciliation completed and provided to Yes Patient/Care Derrin Currey: Provided on Clinical Summary of Care: 08/20/2015 Form Type Recipient Paper Patient EG Electronic Signature(s) Signed: 08/21/2015 5:03:09 PM By: Alric Quan Previous Signature: 08/20/2015 1:40:28 PM Version By: Ruthine Dose Entered By: Alric Quan on 08/20/2015 13:42:59 Angela Patterson ([id number]) -------------------------------------------------------------------------------- Lower Extremity Assessment Details Patient Name: Angela Patterson. Date of Service: 08/20/2015 12:45 PM Medical Record Number: [id number] Patient Account Number: [id number] Date of Birth/Sex: 08/17/1920 (82 y.o. Female) Treating RN: Carolyne Fiscal, Debi Primary Care Physician: Einar Pheasant Other Clinician: Referring Physician: Einar Pheasant Treating Physician/Extender: Frann Rider in Treatment: 3 Vascular Assessment Pulses: Posterior Tibial Dorsalis Pedis Palpable: [Left:Yes] Extremity colors, hair growth, and conditions: Extremity Color: [Left:Normal] Temperature of Extremity: [Left:Warm] Capillary Refill: [Left:< 3 seconds] Toe Nail Assessment Left: Right: Thick: No Discolored: No Deformed: No Improper Length and Hygiene: No Electronic Signature(s) Signed: 08/21/2015 5:03:09 PM By: Alric Quan Entered By: Alric Quan on 08/20/2015 13:08:19 Angela Patterson ([id number]) -------------------------------------------------------------------------------- Multi Wound Chart Details Patient Name: Angela Patterson. Date of Service: 08/20/2015 12:45 PM Medical Record Number: [id number] Patient Account Number: [id number] Date of Birth/Sex: 1920-08-16 (82 y.o. 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] Comorbid History: [3:Cataracts, Glaucoma, Hypertension, Type II Diabetes, History of pressure wounds, Neuropathy] [N/A:N/A] Date Acquired: [3:07/01/2015] [N/A:N/A] Weeks of Treatment: [3:3] [N/A:N/A] Wound Status: [3:Open] [N/A:N/A] Measurements L x W x D 1.2x1.2x0.2 [N/A:N/A] (cm) Area (cm) : [3:1.131] [N/A:N/A] Volume (cm) : [3:0.226] [N/A:N/A] % Reduction in Area: [3:76.00%] [N/A:N/A] % Reduction in Volume: 76.00% [N/A:N/A] Starting Position 1 9 (o'clock): Ending Position 1 [3:9] (o'clock): Maximum Distance 1 1 (  cm): Undermining: [3:Yes] [N/A:N/A] Classification: [3:Grade 1] [N/A:N/A] Exudate Amount: [3:Large] [N/A:N/A] Exudate Type: [3:Serosanguineous] [N/A:N/A] Exudate Color: red, brown N/A N/A Wound Margin: Distinct, outline attached N/A N/A Granulation Amount: Small (1-33%) N/A N/A Granulation Quality: Pink, Pale N/A N/A Necrotic Amount: Large (67-100%) N/A N/A Necrotic Tissue: Eschar, Adherent Slough N/A N/A Exposed Structures: Fascia: No N/A N/A Fat: No Tendon: No Muscle: No Joint: No Bone: No Limited to Skin Breakdown Epithelialization: None N/A N/A Periwound Skin Texture: Callus: Yes N/A N/A Edema: No Excoriation: No Induration: No Crepitus: No Fluctuance: No Friable: No Rash: No Scarring: No Periwound Skin Moist: Yes N/A N/A Moisture: Maceration: No Dry/Scaly: No Periwound Skin Color: Atrophie Blanche: No N/A N/A Cyanosis: No Ecchymosis: No Erythema: No Hemosiderin Staining: No Mottled: No Pallor:  No Rubor: No Temperature: No Abnormality N/A N/A Tenderness on Yes N/A N/A Palpation: Wound Preparation: Ulcer Cleansing: N/A N/A Rinsed/Irrigated with Saline Topical Anesthetic Applied: Other: lidocaine 4% Treatment Notes Electronic Signature(s) Angela Patterson, Angela Patterson ([id number]) Signed: 08/21/2015 5:03:09 PM By: Alric Quan Entered By: Alric Quan on 08/20/2015 13:14:47 Angela Patterson ([id number]) -------------------------------------------------------------------------------- [place] Details Patient Name: Angela Patterson. Date of Service: 08/20/2015 12:45 PM Medical Record Number: [id number] Patient Account Number: [id number] Date of Birth/Sex: 27-Apr-1921 (82 y.o. 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 Anxiety related to knowledge deficit of hyperbaric oxygen therapy and treatment procedures Discomfort related to temperature and humidity changes inside hyperbaric chamber Potential for barotraumas to ears, sinuses, teeth, and lungs or cerebral gas embolism related to changes in atmospheric pressure inside hyperbaric oxygen chamber Potential for oxygen toxicity seizures related to delivery of 100% oxygen at an increased atmospheric pressure Potential for pulmonary oxygen toxicity related to delivery of 100% oxygen at an increased atmospheric pressure Goals: Barotrauma will be prevented during HBO2 Date Initiated: 07/30/2015 Goal Status: Active Patient and/or family will be able to state/discuss factors appropriate to the management of their disease process during treatment Date Initiated: 07/30/2015 Goal Status: Active Patient will tolerate the hyperbaric oxygen therapy  treatment Date Initiated: 07/30/2015 Goal Status: Active Patient will tolerate the internal climate of the chamber Date Initiated: 07/30/2015 Goal Status: Active Patient/caregiver will verbalize understanding of HBO goals, rationale, procedures and potential hazards Date Initiated: 07/30/2015 Goal Status: Active Signs and symptoms of pulmonary oxygen toxicity will be recognized and promptly addressed Date Initiated: 07/30/2015 Goal Status: Active Signs and symptoms of seizure will be recognized and promptly addressed ; seizing patients will suffer no harm Date Initiated: 07/30/2015 Angela Patterson ([id number]) Goal Status: Active Interventions: Administer a five (5) minute air break for patient if signs and symptoms of seizure appear and notify the hyperbaric physician Administer a ten (10) minute air break for patient if signs and symptoms of seizure appear and notify the hyperbaric physician Administer decongestants, per physician orders, prior to HBO2 Administer the correct therapeutic gas delivery based on the patients needs and limitations, per physician order Assess and provide for patientos comfort related to the hyperbaric environment and equalization of middle ear Assess for signs and symptoms related to adverse events, including but not limited to confinement anxiety, pneumothorax, oxygen toxicity and baurotrauma Assess patient for any history of confinement anxiety Assess patient's knowledge and expectations regarding hyperbaric medicine and provide education related to the hyperbaric environment, goals of treatment  and prevention of adverse events Implement protocols to decrease risk of pneumothorax in high risk patients Notes: Orientation to the Wound Care Program Nursing Diagnoses: Knowledge deficit related to the wound healing center program Goals: Patient/caregiver will verbalize understanding of the [place] Date Initiated: 07/30/2015 Goal Status:  Active Interventions: Provide education on orientation to the wound center Notes: Wound/Skin Impairment Nursing Diagnoses: Impaired tissue integrity Knowledge deficit related to smoking impact on wound healing Goals: Patient will have a decrease in wound volume by X% from date: (specify in notes) Date Initiated: 07/30/2015 Goal Status: Active Patient/caregiver will verbalize understanding of skin care regimen Angela Patterson, Angela Patterson ([id number]) Date Initiated: 07/30/2015 Goal Status: Active Ulcer/skin breakdown will have a volume reduction of 30% by week 4 Date Initiated: 07/30/2015 Goal Status: Active Ulcer/skin breakdown will have a volume reduction of 50% by week 8 Date Initiated: 07/30/2015 Goal Status: Active Ulcer/skin breakdown will have a volume reduction of 80% by week 12 Date Initiated: 07/30/2015 Goal Status: Active Ulcer/skin breakdown will heal within 14 weeks Date Initiated: 07/30/2015 Goal Status: Active Interventions: Assess patient/caregiver ability to obtain necessary supplies Assess patient/caregiver ability to perform ulcer/skin care regimen upon admission and as needed Provide education on ulcer and skin care Notes: Electronic Signature(s) Signed: 08/21/2015 5:03:09 PM By: Alric Quan Entered By: Alric Quan on 08/20/2015 13:14:38 Angela Patterson ([id number]) -------------------------------------------------------------------------------- Pain Assessment Details Patient Name: Angela Patterson. Date of Service: 08/20/2015 12:45 PM Medical Record Number: [id number] Patient Account Number: [id number] Date of Birth/Sex: Feb 04, 1921 (82 y.o. Female) Treating RN: Carolyne Fiscal, Debi Primary Care Physician: Einar Pheasant Other Clinician: Referring Physician: Einar Pheasant Treating Physician/Extender: Frann Rider in Treatment: 3 Active Problems Location of Pain Severity and Description of Pain Patient Has Paino No Site Locations Pain Management and  Medication Current Pain Management: Electronic Signature(s) Signed: 08/21/2015 5:03:09 PM By: Alric Quan Entered By: Alric Quan on 08/20/2015 13:03:04 Angela Patterson ([id number]) -------------------------------------------------------------------------------- Patient/Caregiver Education Details Patient Name: Angela Patterson. Date of Service: 08/20/2015 12:45 PM Medical Record Number: [id number] Patient Account Number: [id number] Date of Birth/Gender: 07-31-1920 (82 y.o. Female) Treating RN: Carolyne Fiscal, Debi Primary Care Physician: Einar Pheasant Other Clinician: Referring Physician: Einar Pheasant Treating Physician/Extender: Frann Rider in Treatment: 3 Education Assessment Education Provided To: Patient Education Topics Provided Wound/Skin Impairment: Handouts: Other: change dressing as ordered Methods: Demonstration, Explain/Verbal Responses: State content correctly Electronic Signature(s) Signed: 08/21/2015 5:03:09 PM By: Alric Quan Entered By: Alric Quan on 08/20/2015 13:43:13 Angela Patterson ([id number]) -------------------------------------------------------------------------------- Wound Assessment Details Patient Name: Angela Patterson. Date of Service: 08/20/2015 12:45 PM Medical Record Number: [id number] Patient Account Number: [id number] Date of Birth/Sex: 1921-04-16 (82 y.o. Female) Treating RN: Carolyne Fiscal, Debi Primary Care Physician: Einar Pheasant Other Clinician: Referring Physician: Einar Pheasant Treating Physician/Extender: Frann Rider in Treatment: 3 Wound Status Wound Number: 3 Primary Diabetic Wound/Ulcer of the Lower Etiology: Extremity Wound Location: Left Metatarsal head fifth - Plantar Wound Open Status: Wounding Event: Gradually Appeared Comorbid Cataracts, Glaucoma, Hypertension, Date Acquired: 07/01/2015 History: Type II Diabetes, History of pressure Weeks Of Treatment: 3 wounds, Neuropathy Clustered  Wound: No Photos Photo Uploaded By: Alric Quan on 08/20/2015 15:44:35 Wound Measurements Length: (cm) 1.2 Width: (cm) 1.2 Depth: (cm) 0.2 Area: (cm) 1.131 Volume: (cm) 0.226 % Reduction in Area: 76% % Reduction in Volume: 76% Epithelialization: None Tunneling: No Undermining: Yes Starting Position (o'clock): 9 Ending Position (o'clock): 9 Maximum Distance: (cm) 1 Wound Description Classification: Grade 1 Wound Margin: Distinct, outline attached Exudate Amount: Large  Exudate Type: Serosanguineous Exudate Color: red, brown Foul Odor After Cleansing: No Wound Bed Angela Patterson, Angela Patterson. ([id number]) Granulation Amount: Small (1-33%) Exposed Structure Granulation Quality: Pink, Pale Fascia Exposed: No Necrotic Amount: Large (67-100%) Fat Layer Exposed: No Necrotic Quality: Eschar, Adherent Slough Tendon Exposed: No Muscle Exposed: No Joint Exposed: No Bone Exposed: No Limited to Skin Breakdown Periwound Skin Texture Texture Color No Abnormalities Noted: No No Abnormalities Noted: No Callus: Yes Atrophie Blanche: No Crepitus: No Cyanosis: No Excoriation: No Ecchymosis: No Fluctuance: No Erythema: No Friable: No Hemosiderin Staining: No Induration: No Mottled: No Localized Edema: No Pallor: No Rash: No Rubor: No Scarring: No Temperature / Pain Moisture Temperature: No Abnormality No Abnormalities Noted: No Tenderness on Palpation: Yes Dry / Scaly: No Maceration: No Moist: Yes Wound Preparation Ulcer Cleansing: Rinsed/Irrigated with Saline Topical Anesthetic Applied: Other: lidocaine 4%, Treatment Notes Wound #3 (Left, Plantar Metatarsal head fifth) 1. Cleansed with: Clean wound with Normal Saline 2. Anesthetic Topical Lidocaine 4% cream to wound bed prior to debridement 4. Dressing Applied: Aquacel Ag Foam 5. Secondary Dressing Applied Gauze and Kerlix/Conform 7. 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 Date of Service: 08/20/2015 12:45 PM Medical Record Number: [id number] Patient Account Number: [id number] Date of Birth/Sex: 1921/06/03 (82 y.o. Female) Treating RN: Carolyne Fiscal, Debi Primary Care Physician: Einar Pheasant Other Clinician: Referring Physician: Einar Pheasant Treating Physician/Extender: Frann Rider in Treatment: 3 Vital Signs Time Taken: 13:08 Temperature (F): 97.8 Height (in): 65 Pulse (bpm): 86 Weight (lbs): 107 Respiratory Rate (breaths/min): 20 Body Mass Index (BMI): 17.8 Blood Pressure (mmHg): 161/81 Reference Range: 80 - 120 mg / dl Electronic Signature(s) Signed: 08/21/2015 5:03:09 PM By: Alric Quan Entered By: Alric Quan on 08/20/2015 13:08:12

## 2015-08-22 NOTE — Progress Notes (Signed)
Angela Patterson ([id number]) Visit Report for 08/20/2015 Chief Complaint Document Details Patient Name: Angela Patterson 08/20/2015 12:45 Date of Service: PM Medical Record [id number] Number: Patient Account Number: [id number] Jan 28, 1921 (82 y.o. Treating RN: Ahmed Prima Date of Birth/Sex: Female) Other Clinician: Primary Care Physician: Einar Pheasant Treating Christin Fudge Referring Physician: Einar Pheasant Physician/Extender: Suella Grove in Treatment: 3 Information Obtained from: Patient Chief Complaint Patient presents to the wound care center for a consult due non healing wound Pleasant 82 year old comes along with her daughter with a recurrent ulceration and callosity on her left foot. she has had this for about 3 weeks now. Electronic Signature(s) Signed: 08/20/2015 1:43:47 PM By: Christin Fudge MD, FACS Entered By: Christin Fudge on 08/20/2015 13:43:47 Angela Patterson ([id number]) -------------------------------------------------------------------------------- Debridement Details Patient Name: Angela Patterson 08/20/2015 12:45 Date of Service: PM Medical Record [id number] Number: Patient Account Number: [id number] 12/08/1920 (82 y.o. Treating RN: Ahmed Prima Date of Birth/Sex: Female) Other Clinician: Primary Care Physician: Einar Pheasant Treating Christin Fudge Referring Physician: Einar Pheasant Physician/Extender: Suella Grove in Treatment: 3 Debridement Performed for Wound #3 Left,Plantar Metatarsal head fifth Assessment: Performed By: Physician Christin Fudge, MD Debridement: Debridement Pre-procedure Yes Verification/Time Out Taken: Start Time: 13:21 Pain Control: Other : lidocaine 4% cream Level: Skin/Subcutaneous Tissue Total Area Debrided (L x 1.2 (cm) x 1.2 (cm) = 1.44 (cm) W): Tissue and other Viable, Non-Viable, Callus, Exudate, Fibrin/Slough, Subcutaneous material debrided: Instrument: Forceps, Scissors Bleeding: Moderate Hemostasis Achieved: Silver  Nitrate End Time: 13:28 Procedural Pain: 0 Post Procedural Pain: 0 Response to Treatment: Procedure was tolerated well Post Debridement Measurements of Total Wound Length: (cm) 1.2 Width: (cm) 1.2 Depth: (cm) 0.2 Volume: (cm) 0.226 Post Procedure Diagnosis Same as Pre-procedure Electronic Signature(s) Signed: 08/20/2015 1:42:17 PM By: Christin Fudge MD, FACS Signed: 08/21/2015 5:03:09 PM By: Alric Quan Entered By: Christin Fudge on 08/20/2015 13:42:17 Angela Patterson ([id number]) -------------------------------------------------------------------------------- HPI Details Patient Name: Angela Patterson. 08/20/2015 12:45 Date of Service: PM Medical Record [id number] Number: Patient Account Number: [id number] [birth year] (82 y.o. Treating RN: Ahmed Prima Date of Birth/Sex: Female) Other Clinician: Primary Care Physician: Einar Pheasant Treating Christin Fudge Referring Physician: Einar Pheasant Physician/Extender: Weeks in Treatment: 3 History of Present Illness Location: left foot Quality: mild pain occasionally. Severity: the problem is been getting worse for the last 3 weeks Duration: she's had this for almost a year now on and off. Timing: pain is intermittent and occasional. Context: she was seen by me earlier in March and again in May and was healed by early June 2016 Modifying Factors: she has had diabetic shoes made and is compliant with wearing these. Associated Signs and Symptoms: worse on standing on her feet for too long. HPI Description: this very pleasant 82 year old patient who is extremely active and ambulating all day has had problems with the left foot for over a year. Her daughter who is her caregiver says she drives around and even goes around and does her chores by herself. this is the third recurrence and after the last time we saw in June and discharged her her daughter has got her to pace of diabetic shoes and she's been wearing these  regularly. She has been on the shoes for about 6 months now. Her diabetes is under control and her blood sugars run from a anywhere between 110 to 130 and she checks them twice a day. no fever or discharge. During her initial visit, an x-ray of her left foot was done on 09/26/2014. There was no cortical erosion  noted there was mild osteopenia in the distal first metatarsal. Radiologist has recommended a MRI of the left foot to exclude osteomyelitis. MRI done on 10/09/2014 reveals that the area where she had a phlegmon is without osteomyelitis. the bone marrow and other areas are compatible with reactive edema rather than osteomyelitis. 08/06/2015 -- x-ray of the left foot done on 07/30/2015 shows IMPRESSION:Soft tissue wound with a bandage is noted over the plantar aspect of the distal left foot. Diffuse osteopenia degenerative change. No acute bony abnormality Electronic Signature(s) Signed: 08/20/2015 1:43:54 PM By: Christin Fudge MD, FACS Entered By: Christin Fudge on 08/20/2015 13:43:54 Angela Patterson ([id number]) -------------------------------------------------------------------------------- Physical Exam Details Patient Name: Angela Patterson 08/20/2015 12:45 Date of Service: PM Medical Record [id number] Number: Patient Account Number: [id number] 31-Jul-1920 (82 y.o. Treating RN: Ahmed Prima Date of Birth/Sex: Female) Other Clinician: Primary Care Physician: Einar Pheasant Treating Christin Fudge Referring Physician: Einar Pheasant Physician/Extender: Weeks in Treatment: 3 Constitutional . Pulse regular. Respirations normal and unlabored. Afebrile. . Eyes Nonicteric. Reactive to light. Ears, Nose, Mouth, and Throat Lips, teeth, and gums WNL.Marland Kitchen Moist mucosa without lesions. Neck supple and nontender. No palpable supraclavicular or cervical adenopathy. Normal sized without goiter. Respiratory WNL. No retractions.. Cardiovascular Pedal Pulses WNL. No clubbing, cyanosis or  edema. Chest Breasts symmetical and no nipple discharge.. Breast tissue WNL, no masses, lumps, or tenderness.. Lymphatic No adneopathy. No adenopathy. No adenopathy. Musculoskeletal Adexa without tenderness or enlargement.. Digits and nails w/o clubbing, cyanosis, infection, petechiae, ischemia, or inflammatory conditions.. Integumentary (Hair, Skin) No suspicious lesions. No crepitus or fluctuance. No peri-wound warmth or erythema. No masses.Marland Kitchen Psychiatric Judgement and insight Intact.. No evidence of depression, anxiety, or agitation.. Notes she continues to have some callus buildup medially and this was sharply debrided including some of the subcutaneous tissue and hemostasis was achieved with silver nitrate. Electronic Signature(s) Signed: 08/20/2015 1:44:31 PM By: Christin Fudge MD, FACS Entered By: Christin Fudge on 08/20/2015 13:44:30 Angela Patterson ([id number]) -------------------------------------------------------------------------------- Physician Orders Details Patient Name: MAKALEIGH, HAUSCH 08/20/2015 12:45 Date of Service: PM Medical Record [id number] Number: Patient Account Number: [id number] April 26, 1921 (82 y.o. Treating RN: Ahmed Prima Date of Birth/Sex: Female) Other Clinician: Primary Care Physician: Einar Pheasant Treating Christin Fudge Referring Physician: Einar Pheasant Physician/Extender: Suella Grove in Treatment: 3 Verbal / Phone Orders: Yes ClinicianCarolyne Fiscal, Debi Read Back and Verified: Yes Diagnosis Coding Wound Cleansing Wound #3 Left,Plantar Metatarsal head fifth o Cleanse wound with mild soap and water o May Shower, gently pat wound dry prior to applying new dressing. Anesthetic Wound #3 Left,Plantar Metatarsal head fifth o Topical Lidocaine 4% cream applied to wound bed prior to debridement Primary Wound Dressing Wound #3 Left,Plantar Metatarsal head fifth o Aquacel Ag Secondary Dressing Wound #3 Left,Plantar Metatarsal head  fifth o Gauze and Kerlix/Conform o Foam Dressing Change Frequency Wound #3 Left,Plantar Metatarsal head fifth o Change dressing every other day. Follow-up Appointments Wound #3 Left,Plantar Metatarsal head fifth o Return Appointment in 1 week. Off-Loading Wound #3 Left,Plantar Metatarsal head fifth o Open toe surgical shoe to: - front offloader darco Additional Orders / Instructions Wound #3 Left,Plantar Metatarsal head fifth o Increase protein intake. RAELAN, TERUEL ([id number]) o Activity as tolerated o Other: - Keep blood sugars under control Electronic Signature(s) Signed: 08/20/2015 3:33:01 PM By: Christin Fudge MD, FACS Signed: 08/21/2015 5:03:09 PM By: Alric Quan Entered By: Alric Quan on 08/20/2015 13:31:34 Angela Patterson ([id number]) -------------------------------------------------------------------------------- Problem List Details Patient Name: ABELINA, WARNELL. 08/20/2015 12:45 Date of Service: PM  Medical Record [id number] Number: Patient Account Number: [id number] 1920-09-03 (82 y.o. 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 08/20/2015 12:45 Date of Service: PM Medical  Record [id number] Number: Patient Account Number: [id number] [birth year] (82 y.o. Treating RN: Ahmed Prima Date of Birth/Sex: Female) Other Clinician: Primary Care Physician: Einar Pheasant Treating Christin Fudge Referring Physician: Einar Pheasant Physician/Extender: Suella Grove in Treatment: 3 Subjective Chief Complaint Information obtained from Patient Patient presents to the wound care center for a consult due non healing wound Pleasant 82 year old comes along with her daughter with a recurrent ulceration and callosity on her left foot. she has had this for about 3 weeks now. History of Present Illness (HPI) The following HPI elements were documented for the patient's wound: Location: left foot Quality: mild pain occasionally. Severity: the problem is been getting worse for the last 3 weeks Duration: she's had this for almost a year now on and off. Timing: pain is intermittent and occasional. Context: she was seen by me earlier in March and again in May and was healed by early June 2016 Modifying Factors: she has had diabetic shoes made and is compliant with wearing these. Associated Signs and Symptoms: worse on standing on her feet for too long. this very pleasant 82 year old patient who is extremely active and ambulating all day has had problems with the left foot for over a year. Her daughter who is her caregiver says she drives around and even goes around and does her chores by herself. this is the third recurrence and after the last time we saw in June and discharged her her daughter has got her to pace of diabetic shoes and she's been wearing these regularly. She has been on the shoes for about 6 months now. Her diabetes is under control and her blood sugars run from a anywhere between 110 to 130 and she checks them twice a day. no fever or discharge. During her initial visit, an x-ray of her left foot was done on 09/26/2014. There was no cortical erosion noted there was  mild osteopenia in the distal first metatarsal. Radiologist has recommended a MRI of the left foot to exclude osteomyelitis. MRI done on 10/09/2014 reveals that the area where she had a phlegmon is without osteomyelitis. the bone marrow and other areas are compatible with reactive edema rather than osteomyelitis. 08/06/2015 -- x-ray of the left foot done on 07/30/2015 shows IMPRESSION:Soft tissue wound with a bandage is noted over the plantar aspect of the distal left foot. Diffuse osteopenia degenerative change. No acute bony abnormality RAYLENA, CORNWALL. ([id number]) Objective Constitutional Pulse regular. Respirations normal and unlabored. Afebrile. Vitals Time Taken: 1:08 PM, Height: 65 in, Weight: 107 lbs, BMI: 17.8, Temperature: 97.8 F, Pulse: 86 bpm, Respiratory Rate: 20 breaths/min, Blood Pressure: 161/81 mmHg. Eyes Nonicteric. Reactive to light. Ears, Nose, Mouth, and Throat Lips, teeth, and gums WNL.Marland Kitchen Moist mucosa without lesions. Neck supple and nontender. No palpable supraclavicular or cervical adenopathy. Normal sized  without goiter. Respiratory WNL. No retractions.. Cardiovascular Pedal Pulses WNL. No clubbing, cyanosis or edema. Chest Breasts symmetical and no nipple discharge.. Breast tissue WNL, no masses, lumps, or tenderness.. Lymphatic No adneopathy. No adenopathy. No adenopathy. Musculoskeletal Adexa without tenderness or enlargement.. Digits and nails w/o clubbing, cyanosis, infection, petechiae, ischemia, or inflammatory conditions.Marland Kitchen Psychiatric Judgement and insight Intact.. No evidence of depression, anxiety, or agitation.. General Notes: she continues to have some callus buildup medially and this was sharply debrided including some of the subcutaneous tissue and hemostasis was achieved with silver nitrate. Integumentary (Hair, Skin) No suspicious lesions. No crepitus or fluctuance. No peri-wound warmth or erythema. No masses.Marland Kitchen KINZE, [id number]  ([id number]) Wound #3 status is Open. Original cause of wound was Gradually Appeared. The wound is located on the Left,Plantar Metatarsal head fifth. The wound measures 1.2cm length x 1.2cm width x 0.2cm depth; 1.131cm^2 area and 0.226cm^3 volume. The wound is limited to skin breakdown. There is no tunneling noted, however, there is undermining starting at 9:00 and ending at 9:00 with a maximum distance of 1cm. There is a large amount of serosanguineous drainage noted. The wound margin is distinct with the outline attached to the wound base. There is small (1-33%) pink, pale granulation within the wound bed. There is a large (67-100%) amount of necrotic tissue within the wound bed including Eschar and Adherent Slough. The periwound skin appearance exhibited: Callus, Moist. The periwound skin appearance did not exhibit: Crepitus, Excoriation, Fluctuance, Friable, Induration, Localized Edema, Rash, Scarring, Dry/Scaly, Maceration, Atrophie Blanche, Cyanosis, Ecchymosis, Hemosiderin Staining, Mottled, Pallor, Rubor, Erythema. Periwound temperature was noted as No Abnormality. The periwound has tenderness on palpation. Assessment Active Problems ICD-10 E11.621 - Type 2 diabetes mellitus with foot ulcer L97.522 - Non-pressure chronic ulcer of other part of left foot with fat layer exposed L84 - Corns and callosities Procedures Wound #3 Wound #3 is a Diabetic Wound/Ulcer of the Lower Extremity located on the Left,Plantar Metatarsal head fifth . There was a Skin/Subcutaneous Tissue Debridement [id number]) debridement with total area of 1.44 sq cm performed by Christin Fudge, MD. with the following instrument(s): Forceps and Scissors to remove Viable and Non-Viable tissue/material including Exudate, Fibrin/Slough, Callus, and Subcutaneous after achieving pain control using Other (lidocaine 4% cream). A time out was conducted prior to the start of the procedure. A Moderate amount of bleeding was  controlled with Silver Nitrate. The procedure was tolerated well with a pain level of 0 throughout and a pain level of 0 following the procedure. Post Debridement Measurements: 1.2cm length x 1.2cm width x 0.2cm depth; 0.226cm^3 volume. Post procedure Diagnosis Wound #3: Same as Pre-Procedure Plan TERA, EMGE ([id number]) Wound Cleansing: Wound #3 Left,Plantar Metatarsal head fifth: Cleanse wound with mild soap and water May Shower, gently pat wound dry prior to applying new dressing. Anesthetic: Wound #3 Left,Plantar Metatarsal head fifth: Topical Lidocaine 4% cream applied to wound bed prior to debridement Primary Wound Dressing: Wound #3 Left,Plantar Metatarsal head fifth: Aquacel Ag Secondary Dressing: Wound #3 Left,Plantar Metatarsal head fifth: Gauze and Kerlix/Conform Foam Dressing Change Frequency: Wound #3 Left,Plantar Metatarsal head fifth: Change dressing every other day. Follow-up Appointments: Wound #3 Left,Plantar Metatarsal head fifth: Return Appointment in 1 week. Off-Loading: Wound #3 Left,Plantar Metatarsal head fifth: Open toe surgical shoe to: - front offloader darco Additional Orders / Instructions: Wound #3 Left,Plantar Metatarsal head fifth: Increase protein intake. Activity as tolerated Other: - Keep blood sugars under control I have recommended silver alginate with a offloading felt ring around it.  She will also use a Darco front off loading shoe. She will come back to see me every week. Electronic Signature(s) Signed: 08/20/2015 1:44:50 PM By: Christin Fudge MD, FACS Entered By: Christin Fudge on 08/20/2015 13:44:50 Angela Patterson ([id number]) -------------------------------------------------------------------------------- SuperBill Details Patient Name: Angela Patterson. Date of Service: 08/20/2015 Medical Record Number: [id number] Patient Account Number: [id number] Date of Birth/Sex: 04/02/1921 (82 y.o. Female) Treating RN: Carolyne Fiscal, Debi Primary  Care Physician: Einar Pheasant Other Clinician: Referring Physician: Einar Pheasant Treating Physician/Extender: Frann Rider in Treatment: 3 Diagnosis Coding ICD-10 Codes Code Description E11.621 Type 2 diabetes mellitus with 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 with fat layer exposed L84 Corns and callosities Facility Procedures CPT4 Code Description: [id number] - DEB SUBQ TISSUE 20 SQ CM/< ICD-10 Description Diagnosis E11.621 Type 2 diabetes mellitus with 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 L84 Corns and callosities Modifier: with fat lay Quantity: 1 er exposed Physician Procedures CPT4 Code Description: [id number] - WC PHYS SUBQ TISS 20 SQ CM ICD-10 Description Diagnosis E11.621 Type 2 diabetes mellitus with 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 L84 Corns and callosities Modifier: with fat laye Quantity: 1 r exposed Electronic Signature(s) Signed: 08/20/2015 1:45:01 PM By: Christin Fudge MD, FACS Entered By: Christin Fudge on 08/20/2015 13:45:01

## 2015-08-26 ENCOUNTER — Encounter: Payer: Medicare Other | Attending: Surgery | Admitting: Surgery

## 2015-08-26 DIAGNOSIS — I10 Essential (primary) hypertension: Secondary | ICD-10-CM | POA: Diagnosis not present

## 2015-08-26 DIAGNOSIS — E1140 Type 2 diabetes mellitus with diabetic neuropathy, unspecified: Secondary | ICD-10-CM | POA: Insufficient documentation

## 2015-08-26 DIAGNOSIS — L97522 Non-pressure chronic ulcer of other part of left foot with fat layer exposed: Secondary | ICD-10-CM | POA: Diagnosis not present

## 2015-08-26 DIAGNOSIS — E11621 Type 2 diabetes mellitus with foot ulcer: Secondary | ICD-10-CM | POA: Insufficient documentation

## 2015-08-26 DIAGNOSIS — L97509 Non-pressure chronic ulcer of other part of unspecified foot with unspecified severity: Secondary | ICD-10-CM | POA: Diagnosis not present

## 2015-08-26 DIAGNOSIS — L84 Corns and callosities: Secondary | ICD-10-CM | POA: Insufficient documentation

## 2015-08-26 DIAGNOSIS — L97521 Non-pressure chronic ulcer of other part of left foot limited to breakdown of skin: Secondary | ICD-10-CM | POA: Diagnosis not present

## 2015-08-27 ENCOUNTER — Ambulatory Visit: Payer: Medicare Other | Admitting: Internal Medicine

## 2015-08-27 NOTE — Progress Notes (Signed)
NAKOMA, BROWNBACK ([id number]) Visit Report for 08/26/2015 Chief Complaint Document Details Patient Name: Angela Patterson, Angela Patterson. Date of Service: 08/26/2015 10:00 AM Medical Record Number: [id number] Patient Account Number: [id number] Date of Birth/Sex: 1920-11-23 (82 y.o. Female) Treating RN: Carolyne Fiscal, Debi Primary Care Physician: Einar Pheasant Other Clinician: Referring Physician: Einar Pheasant Treating Physician/Extender: Frann Rider in Treatment: 3 Information Obtained from: Patient Chief Complaint Patient presents to the wound care center for a consult due non healing wound Pleasant 82 year old comes along with her daughter with a recurrent ulceration and callosity on her left foot. she has had this for about 3 weeks now. Electronic Signature(s) Signed: 08/26/2015 10:46:58 AM By: Christin Fudge MD, FACS Entered By: Christin Fudge on 08/26/2015 10:46:58 Angela Patterson ([id number]) -------------------------------------------------------------------------------- Debridement Details Patient Name: Angela Patterson. Date of Service: 08/26/2015 10:00 AM Medical Record Number: [id number] Patient Account Number: [id number] Date of Birth/Sex: [birth year] (82 y.o. Female) Treating RN: Carolyne Fiscal, Debi Primary Care Physician: Einar Pheasant Other Clinician: Referring Physician: Einar Pheasant Treating Physician/Extender: Frann Rider in Treatment: 3 Debridement Performed for Wound #3 Left,Plantar Metatarsal head fifth Assessment: Performed By: Physician Christin Fudge, MD Debridement: Debridement Pre-procedure Yes Verification/Time Out Taken: Start Time: 10:32 Pain Control: Other : lidocaine 4% cream Level: Skin/Subcutaneous Tissue Total Area Debrided (L x 0.5 (cm) x 0.9 (cm) = 0.45 (cm) W): Tissue and other Viable, Non-Viable, Callus, Exudate, Fibrin/Slough, Subcutaneous material debrided: Instrument: Forceps, Scissors Bleeding: Minimum Hemostasis Achieved: Pressure End Time:  10:40 Procedural Pain: 0 Post Procedural Pain: 0 Response to Treatment: Procedure was tolerated well Post Debridement Measurements of Total Wound Length: (cm) 0.8 Width: (cm) 1 Depth: (cm) 0.3 Volume: (cm) 0.188 Post Procedure Diagnosis Same as Pre-procedure Electronic Signature(s) Signed: 08/26/2015 10:46:51 AM By: Christin Fudge MD, FACS Signed: 08/26/2015 4:43:39 PM By: Alric Quan Entered By: Christin Fudge on 08/26/2015 10:46:51 Angela Patterson ([id number]) -------------------------------------------------------------------------------- HPI Details Patient Name: Angela Patterson. Date of Service: 08/26/2015 10:00 AM Medical Record Number: [id number] Patient Account Number: [id number] Date of Birth/Sex: 24-Mar-1921 (82 y.o. Female) Treating RN: Carolyne Fiscal, Debi Primary Care Physician: Einar Pheasant Other Clinician: Referring Physician: Einar Pheasant Treating Physician/Extender: Frann Rider in Treatment: 3 History of Present Illness Location: left foot Quality: mild pain occasionally. Severity: the problem is been getting worse for the last 3 weeks Duration: she's had this for almost a year now on and off. Timing: pain is intermittent and occasional. Context: she was seen by me earlier in March and again in May and was healed by early June 2016 Modifying Factors: she has had diabetic shoes made and is compliant with wearing these. Associated Signs and Symptoms: worse on standing on her feet for too long. HPI Description: this very pleasant 82 year old patient who is extremely active and ambulating all day has had problems with the left foot for over a year. Her daughter who is her caregiver says she drives around and even goes around and does her chores by herself. this is the third recurrence and after the last time we saw in June and discharged her her daughter has got her to pace of diabetic shoes and she's been wearing these regularly. She has been on the shoes  for about 6 months now. Her diabetes is under control and her blood sugars run from a anywhere between 110 to 130 and she checks them twice a day. no fever or discharge. During her initial visit, an x-ray of her left foot was done on 09/26/2014. There was no cortical erosion noted  there was mild osteopenia in the distal first metatarsal. Radiologist has recommended a MRI of the left foot to exclude osteomyelitis. MRI done on 10/09/2014 reveals that the area where she had a phlegmon is without osteomyelitis. the bone marrow and other areas are compatible with reactive edema rather than osteomyelitis. 08/06/2015 -- x-ray of the left foot done on 07/30/2015 shows IMPRESSION:Soft tissue wound with a bandage is noted over the plantar aspect of the distal left foot. Diffuse osteopenia degenerative change. No acute bony abnormality Electronic Signature(s) Signed: 08/26/2015 10:47:03 AM By: Christin Fudge MD, FACS Entered By: Christin Fudge on 08/26/2015 10:47:03 Angela Patterson ([id number]) -------------------------------------------------------------------------------- Physical Exam Details Patient Name: Angela Patterson. Date of Service: 08/26/2015 10:00 AM Medical Record Number: [id number] Patient Account Number: [id number] Date of Birth/Sex: Sep 21, 1920 (82 y.o. Female) Treating RN: Carolyne Fiscal, Debi Primary Care Physician: Einar Pheasant Other Clinician: Referring Physician: Einar Pheasant Treating Physician/Extender: Frann Rider in Treatment: 3 Constitutional . Pulse regular. Respirations normal and unlabored. Afebrile. . Eyes Nonicteric. Reactive to light. Ears, Nose, Mouth, and Throat Lips, teeth, and gums WNL.Marland Kitchen Moist mucosa without lesions. Neck supple and nontender. No palpable supraclavicular or cervical adenopathy. Normal sized without goiter. Respiratory WNL. No retractions.. Cardiovascular Pedal Pulses WNL. No clubbing, cyanosis or edema. Lymphatic No adneopathy. No  adenopathy. No adenopathy. Musculoskeletal Adexa without tenderness or enlargement.. Digits and nails w/o clubbing, cyanosis, infection, petechiae, ischemia, or inflammatory conditions.. Integumentary (Hair, Skin) No suspicious lesions. No crepitus or fluctuance. No peri-wound warmth or erythema. No masses.Marland Kitchen Psychiatric Judgement and insight Intact.. No evidence of depression, anxiety, or agitation.. Notes sharp debridement was done today with forceps and scissors and allowed of the callus and the subcutis debris was removed and bleeding controlled with pressure Electronic Signature(s) Signed: 08/26/2015 10:47:37 AM By: Christin Fudge MD, FACS Entered By: Christin Fudge on 08/26/2015 10:47:37 Angela Patterson ([id number]) -------------------------------------------------------------------------------- Physician Orders Details Patient Name: Angela Patterson. Date of Service: 08/26/2015 10:00 AM Medical Record Number: [id number] Patient Account Number: [id number] Date of Birth/Sex: 12/06/1920 (82 y.o. Female) Treating RN: Carolyne Fiscal, Debi Primary Care Physician: Einar Pheasant Other Clinician: Referring Physician: Einar Pheasant Treating Physician/Extender: Frann Rider in Treatment: 3 Verbal / Phone Orders: Yes Clinician: Carolyne Fiscal, Debi Read Back and Verified: Yes Diagnosis Coding Wound Cleansing Wound #3 Left,Plantar Metatarsal head fifth o Cleanse wound with mild soap and water o May Shower, gently pat wound dry prior to applying new dressing. Anesthetic Wound #3 Left,Plantar Metatarsal head fifth o Topical Lidocaine 4% cream applied to wound bed prior to debridement Primary Wound Dressing Wound #3 Left,Plantar Metatarsal head fifth o Aquacel Ag Secondary Dressing Wound #3 Left,Plantar Metatarsal head fifth o Gauze and Kerlix/Conform o Foam Dressing Change Frequency Wound #3 Left,Plantar Metatarsal head fifth o Change dressing every other day. Follow-up  Appointments Wound #3 Left,Plantar Metatarsal head fifth o Return Appointment in 1 week. Off-Loading Wound #3 Left,Plantar Metatarsal head fifth o Open toe surgical shoe to: - front offloader darco Additional Orders / Instructions Wound #3 Left,Plantar Metatarsal head fifth o Increase protein intake. o Activity as tolerated o Other: - Keep blood sugars under control Angela Patterson, Angela Patterson ([id number]) Electronic Signature(s) Signed: 08/26/2015 4:17:59 PM By: Christin Fudge MD, FACS Signed: 08/26/2015 4:43:39 PM By: Alric Quan Entered By: Alric Quan on 08/26/2015 10:43:14 Angela Patterson ([id number]) -------------------------------------------------------------------------------- Problem List Details Patient Name: Angela Patterson. Date of Service: 08/26/2015 10:00 AM Medical Record Number: [id number] Patient Account Number: [id number] Date of Birth/Sex: 12/01/1920 (82 y.o. 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 Date of Service: 08/26/2015 10:00 AM Medical Record Number: [id number] Patient Account Number: [id number] Date of Birth/Sex: 1920/11/29 (82 y.o. Female) Treating RN: Carolyne Fiscal, Debi Primary Care Physician: Einar Pheasant Other Clinician: Referring Physician: Einar Pheasant Treating Physician/Extender: Frann Rider in Treatment: 3 Subjective Chief Complaint Information obtained from Patient Patient presents to the wound care center for a consult due non healing wound Pleasant 82 year old comes along with her daughter with a recurrent ulceration and callosity on her left foot. she has had this for about 3 weeks now. History of Present Illness (HPI) The following HPI elements were documented for the patient's wound: Location: left foot Quality: mild pain occasionally. Severity: the problem is been getting worse for the last 3 weeks Duration: she's had this for almost a year now on and off. Timing: pain is intermittent and occasional. Context: she was seen by me earlier in March and again in May and was healed by early June 2016 Modifying Factors: she has had diabetic shoes made and is compliant with wearing these. Associated Signs and Symptoms: worse on standing on her feet for too long. this very pleasant 82 year old patient who is extremely active and ambulating all day has had problems with the left foot for over a year. Her daughter who is her caregiver says she drives around and even goes around and does her chores by herself. this is the third recurrence and after the last time we saw in June and discharged her her daughter has got her to pace of diabetic shoes and she's been wearing these regularly. She has been on the shoes for about 6 months now. Her diabetes is under control and her blood sugars run from a anywhere between 110 to 130 and she checks them twice a day. no fever or discharge. During her initial visit, an x-ray of her left foot was done on 09/26/2014. There was no cortical erosion noted there was mild osteopenia in the distal first metatarsal. Radiologist has recommended a MRI of the left foot to exclude osteomyelitis. MRI done on 10/09/2014 reveals that the area  where she had a phlegmon is without osteomyelitis. the bone marrow and other areas are compatible with reactive edema rather than osteomyelitis. 08/06/2015 -- x-ray of the left foot done on 07/30/2015 shows IMPRESSION:Soft tissue wound with a bandage is noted over the plantar aspect of the distal left foot. Diffuse osteopenia degenerative change. No acute bony abnormality Angela Patterson, Angela Patterson. ([id number]) Objective Constitutional Pulse regular. Respirations normal and unlabored. Afebrile. Vitals Time Taken: 10:16 AM, Height: 65 in, Weight: 107 lbs, BMI: 17.8, Temperature: 97.5 F, Pulse: 68 bpm, Respiratory Rate: 20 breaths/min, Blood Pressure: 152/69 mmHg. Eyes Nonicteric. Reactive to light. Ears, Nose, Mouth, and Throat Lips, teeth, and gums WNL.Marland Kitchen Moist mucosa without lesions. Neck supple and nontender. No palpable supraclavicular or cervical adenopathy. Normal sized without goiter. Respiratory WNL. No retractions.. Cardiovascular Pedal Pulses WNL. No clubbing, cyanosis or edema. Lymphatic No  adneopathy. No adenopathy. No adenopathy. Musculoskeletal Adexa without tenderness or enlargement.. Digits and nails w/o clubbing, cyanosis, infection, petechiae, ischemia, or inflammatory conditions.Marland Kitchen Psychiatric Judgement and insight Intact.. No evidence of depression, anxiety, or agitation.. General Notes: sharp debridement was done today with forceps and scissors and allowed of the callus and the subcutis debris was removed and bleeding controlled with pressure Integumentary (Hair, Skin) No suspicious lesions. No crepitus or fluctuance. No peri-wound warmth or erythema. No masses.. Wound #3 status is Open. Original cause of wound was Gradually Appeared. The wound is located on the Left,Plantar Metatarsal head fifth. The wound measures 0.5cm length x 0.9cm width x 0.3cm depth; 0.353cm^2 area and 0.106cm^3 volume. The wound is limited to skin breakdown. There is no tunneling noted, however,  there is undermining starting at 3:00 and ending at :00 with a maximum distance of 0.3cm. There is a large amount of serosanguineous drainage noted. The wound margin is distinct with the outline attached to the wound base. There is medium (34-66%) pink, pale granulation within the wound bed. There Angela Patterson, Angela Patterson. ([id number]) is a medium (34-66%) amount of necrotic tissue within the wound bed including Eschar and Adherent Slough. The periwound skin appearance exhibited: Callus, Moist. The periwound skin appearance did not exhibit: Crepitus, Excoriation, Fluctuance, Friable, Induration, Localized Edema, Rash, Scarring, Dry/Scaly, Maceration, Atrophie Blanche, Cyanosis, Ecchymosis, Hemosiderin Staining, Mottled, Pallor, Rubor, Erythema. Periwound temperature was noted as No Abnormality. The periwound has tenderness on palpation. Assessment Active Problems ICD-10 E11.621 - Type 2 diabetes mellitus with foot ulcer L97.522 - Non-pressure chronic ulcer of other part of left foot with fat layer exposed L84 - Corns and callosities Procedures Wound #3 Wound #3 is a Diabetic Wound/Ulcer of the Lower Extremity located on the Left,Plantar Metatarsal head fifth . There was a Skin/Subcutaneous Tissue Debridement [id number]) debridement with total area of 0.45 sq cm performed by Christin Fudge, MD. with the following instrument(s): Forceps and Scissors to remove Viable and Non-Viable tissue/material including Exudate, Fibrin/Slough, Callus, and Subcutaneous after achieving pain control using Other (lidocaine 4% cream). A time out was conducted prior to the start of the procedure. A Minimum amount of bleeding was controlled with Pressure. The procedure was tolerated well with a pain level of 0 throughout and a pain level of 0 following the procedure. Post Debridement Measurements: 0.8cm length x 1cm width x 0.3cm depth; 0.188cm^3 volume. Post procedure Diagnosis Wound #3: Same as Pre-Procedure Plan Wound  Cleansing: Wound #3 Left,Plantar Metatarsal head fifth: Cleanse wound with mild soap and water May Shower, gently pat wound dry prior to applying new dressing. Anesthetic: Angela Patterson, Angela Patterson ([id number]) Wound #3 Left,Plantar Metatarsal head fifth: Topical Lidocaine 4% cream applied to wound bed prior to debridement Primary Wound Dressing: Wound #3 Left,Plantar Metatarsal head fifth: Aquacel Ag Secondary Dressing: Wound #3 Left,Plantar Metatarsal head fifth: Gauze and Kerlix/Conform Foam Dressing Change Frequency: Wound #3 Left,Plantar Metatarsal head fifth: Change dressing every other day. Follow-up Appointments: Wound #3 Left,Plantar Metatarsal head fifth: Return Appointment in 1 week. Off-Loading: Wound #3 Left,Plantar Metatarsal head fifth: Open toe surgical shoe to: - front offloader darco Additional Orders / Instructions: Wound #3 Left,Plantar Metatarsal head fifth: Increase protein intake. Activity as tolerated Other: - Keep blood sugars under control I have recommended silver alginate with a offloading felt ring around it. She will also use a Darco front off loading shoe. She will come back to see me every week. Electronic Signature(s) Signed: 08/26/2015 10:47:48 AM By: Christin Fudge MD, FACS Entered By: Christin Fudge  on 08/26/2015 10:47:48 Angela Patterson, Angela Patterson ([id number]) -------------------------------------------------------------------------------- SuperBill Details Patient Name: Angela Patterson, Angela Patterson. Date of Service: 08/26/2015 Medical Record Number: [id number] Patient Account Number: [id number] Date of Birth/Sex: 31-Mar-1921 (82 y.o. Female) Treating RN: Carolyne Fiscal, Debi Primary Care Physician: Einar Pheasant Other Clinician: Referring Physician: Einar Pheasant Treating Physician/Extender: Frann Rider in Treatment: 3 Diagnosis Coding ICD-10 Codes Code Description E11.621 Type 2 diabetes mellitus with foot ulcer L97.522 Non-pressure chronic ulcer of other part of  left foot with fat layer exposed L84 Corns and callosities Facility Procedures CPT4 Code Description: [id number] - DEB SUBQ TISSUE 20 SQ CM/< ICD-10 Description Diagnosis E11.621 Type 2 diabetes mellitus with 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 L84 Corns and callosities Modifier: with fat lay Quantity: 1 er exposed Physician Procedures CPT4 Code Description: [id number] - WC PHYS SUBQ TISS 20 SQ CM ICD-10 Description Diagnosis E11.621 Type 2 diabetes mellitus with 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 L84 Corns and callosities Modifier: with fat laye Quantity: 1 r exposed Electronic Signature(s) Signed: 08/26/2015 10:47:58 AM By: Christin Fudge MD, FACS Entered By: Christin Fudge on 08/26/2015 10:47:57

## 2015-08-27 NOTE — Progress Notes (Signed)
Angela Patterson, Angela Patterson ([id number]) Visit Report for 08/26/2015 Arrival Information Details Patient Name: Angela Patterson, Angela Patterson. Date of Service: 08/26/2015 10:00 AM Medical Record Number: [id number] Patient Account Number: [id number] Date of Birth/Sex: [birth year] (82 y.o. Female) Treating RN: Carolyne Fiscal, Debi Primary Care Physician: Einar Pheasant Other Clinician: Referring Physician: Einar Pheasant Treating Physician/Extender: Frann Rider in Treatment: 3 Visit Information History Since Last Visit All ordered tests and consults were completed: No Patient Arrived: Ambulatory Added or deleted any medications: No Arrival Time: 10:14 Any new allergies or adverse reactions: No Accompanied By: granddaughter Had a fall or experienced change in No Transfer Assistance: None activities of daily living that may affect Patient Identification Verified: Yes risk of falls: Secondary Verification Process Yes Signs or symptoms of abuse/neglect since last No Completed: visito Patient Requires Transmission- No Hospitalized since last visit: No Based Precautions: Pain Present Now: No Patient Has Alerts: No Electronic Signature(s) Signed: 08/26/2015 4:43:39 PM By: Alric Quan Entered By: Alric Quan on 08/26/2015 12:02:11 Angela Patterson ([id number]) -------------------------------------------------------------------------------- Encounter Discharge Information Details Patient Name: Angela Patterson. Date of Service: 08/26/2015 10:00 AM Medical Record Number: [id number] Patient Account Number: [id number] Date of Birth/Sex: 04/27/1921 (82 y.o. Female) Treating RN: Carolyne Fiscal, Debi Primary Care Physician: Einar Pheasant Other Clinician: Referring Physician: Einar Pheasant Treating Physician/Extender: Frann Rider in Treatment: 3 Encounter Discharge Information Items Discharge Pain Level: 0 Discharge Condition: Stable Ambulatory Status: Ambulatory Discharge Destination:  Home Transportation: Private Auto Accompanied By: granddaughter Schedule Follow-up Appointment: Yes Medication Reconciliation completed and provided to Patient/Care Yes Evanthia Maund: Provided on Clinical Summary of Care: 08/26/2015 Form Type Recipient Paper Patient EG Electronic Signature(s) Signed: 08/26/2015 10:51:19 AM By: Ruthine Dose Entered By: Ruthine Dose on 08/26/2015 10:51:19 Angela Patterson ([id number]) -------------------------------------------------------------------------------- Lower Extremity Assessment Details Patient Name: Angela Patterson. Date of Service: 08/26/2015 10:00 AM Medical Record Number: [id number] Patient Account Number: [id number] Date of Birth/Sex: 04-Mar-1921 (82 y.o. Female) Treating RN: Carolyne Fiscal, Debi Primary Care Physician: Einar Pheasant Other Clinician: Referring Physician: Einar Pheasant Treating Physician/Extender: Frann Rider in Treatment: 3 Vascular Assessment Pulses: Posterior Tibial Dorsalis Pedis Palpable: [Left:Yes] Extremity colors, hair growth, and conditions: Extremity Color: [Left:Normal] Temperature of Extremity: [Left:Warm] Capillary Refill: [Left:< 3 seconds] Toe Nail Assessment Left: Right: Thick: No Discolored: No Deformed: No Improper Length and Hygiene: No Electronic Signature(s) Signed: 08/26/2015 4:43:39 PM By: Alric Quan Entered By: Alric Quan on 08/26/2015 10:19:54 Angela Patterson ([id number]) -------------------------------------------------------------------------------- Multi Wound Chart Details Patient Name: Angela Patterson. Date of Service: 08/26/2015 10:00 AM Medical Record Number: [id number] Patient Account Number: [id number] Date of Birth/Sex: [birth year] (82 y.o. 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] Comorbid History: [3:Cataracts, Glaucoma, Hypertension, Type II Diabetes, History of pressure wounds, Neuropathy] [N/A:N/A] Date Acquired: [3:07/01/2015] [N/A:N/A] Weeks of Treatment: [3:3] [N/A:N/A] Wound Status: [3:Open] [N/A:N/A] Measurements L x W x D 0.5x0.9x0.3 [N/A:N/A] (cm) Area (cm) : [3:0.353] [N/A:N/A] Volume (cm) : [3:0.106] [N/A:N/A] % Reduction in Area: [3:92.50%] [N/A:N/A] % Reduction in Volume: 88.70% [N/A:N/A] Starting Position 1 3 (o'clock): Ending Position 1 (o'clock): Maximum Distance 1 0.3 (cm): Undermining: [3:Yes] [N/A:N/A] Classification: [3:Grade 1] [N/A:N/A] Exudate Amount: [  3:Large] [N/A:N/A] Exudate Type: [3:Serosanguineous] [N/A:N/A] Exudate Color: red, brown N/A N/A Wound Margin: Distinct, outline attached N/A N/A Granulation Amount: Medium (34-66%) N/A N/A Granulation Quality: Pink, Pale N/A N/A Necrotic Amount: Medium (34-66%) N/A N/A Necrotic Tissue: Eschar, Adherent Slough N/A N/A Exposed Structures: Fascia: No N/A N/A Fat: No Tendon: No Muscle: No Joint: No Bone: No Limited to Skin Breakdown Epithelialization: None N/A N/A Periwound Skin Texture: Callus: Yes N/A N/A Edema: No Excoriation: No Induration: No Crepitus: No Fluctuance: No Friable: No Rash: No Scarring: No Periwound Skin Moist: Yes N/A N/A Moisture: Maceration: No Dry/Scaly: No Periwound Skin Color: Atrophie Blanche: No N/A N/A Cyanosis: No Ecchymosis: No Erythema: No Hemosiderin Staining: No Mottled: No Pallor: No Rubor: No Temperature: No Abnormality N/A N/A Tenderness on Yes N/A  N/A Palpation: Wound Preparation: Ulcer Cleansing: N/A N/A Rinsed/Irrigated with Saline Topical Anesthetic Applied: Other: lidocaine 4% Treatment Notes Electronic Signature(s) Angela Patterson, Angela Patterson ([id number]) Signed: 08/26/2015 4:43:39 PM By: Alric Quan Entered By: Alric Quan on 08/26/2015 10:27:26 Angela Patterson ([id number]) -------------------------------------------------------------------------------- [place] Details Patient Name: Angela Patterson. Date of Service: 08/26/2015 10:00 AM Medical Record Number: [id number] Patient Account Number: [id number] Date of Birth/Sex: Jul 17, 1921 (82 y.o. 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 Anxiety related to knowledge deficit of hyperbaric oxygen therapy and treatment procedures Discomfort related to temperature and humidity changes inside hyperbaric chamber Potential for barotraumas to ears, sinuses, teeth, and lungs or cerebral gas embolism related to changes in atmospheric pressure inside hyperbaric oxygen chamber Potential for oxygen toxicity seizures related to delivery of 100% oxygen at an increased atmospheric pressure Potential for pulmonary oxygen toxicity related to delivery of 100% oxygen at an increased atmospheric pressure Goals: Barotrauma will be prevented during HBO2 Date Initiated: 07/30/2015 Goal Status: Active Patient and/or family will be able to state/discuss factors appropriate to the management of their disease process during treatment Date Initiated: 07/30/2015 Goal Status: Active Patient will tolerate the hyperbaric oxygen therapy treatment Date Initiated: 07/30/2015 Goal Status: Active Patient will  tolerate the internal climate of the chamber Date Initiated: 07/30/2015 Goal Status: Active Patient/caregiver will verbalize understanding of HBO goals, rationale, procedures and potential hazards Date Initiated: 07/30/2015 Goal Status: Active Signs and symptoms of pulmonary oxygen toxicity will be recognized and promptly addressed Date Initiated: 07/30/2015 Goal Status: Active Signs and symptoms of seizure will be recognized and promptly addressed ; seizing patients will suffer no harm Date Initiated: 07/30/2015 Angela Patterson ([id number]) Goal Status: Active Interventions: Administer a five (5) minute air break for patient if signs and symptoms of seizure appear and notify the hyperbaric physician Administer a ten (10) minute air break for patient if signs and symptoms of seizure appear and notify the hyperbaric physician Administer decongestants, per physician orders, prior to HBO2 Administer the correct therapeutic gas delivery based on the patients needs and limitations, per physician order Assess and provide for patientos comfort related to the hyperbaric environment and equalization of middle ear Assess for signs and symptoms related to adverse events, including but not limited to confinement anxiety, pneumothorax, oxygen toxicity and baurotrauma Assess patient for any history of confinement anxiety Assess patient's knowledge and expectations regarding hyperbaric medicine and provide education related to the hyperbaric environment, goals of treatment and prevention of adverse events Implement protocols to decrease risk  of pneumothorax in high risk patients Notes: Orientation to the Wound Care Program Nursing Diagnoses: Knowledge deficit related to the wound healing center program Goals: Patient/caregiver will verbalize understanding of the [facility] [place] [facility] Date Initiated: 07/30/2015 Goal Status: Active Interventions: Provide education on orientation to the wound  center Notes: Wound/Skin Impairment Nursing Diagnoses: Impaired tissue integrity Knowledge deficit related to smoking impact on wound healing Goals: Patient will have a decrease in wound volume by X% from date: (specify in notes) Date Initiated: 07/30/2015 Goal Status: Active Patient/caregiver will verbalize understanding of skin care regimen Angela Patterson, Angela Patterson ([id number]) Date Initiated: 07/30/2015 Goal Status: Active Ulcer/skin breakdown will have a volume reduction of 30% by week 4 Date Initiated: 07/30/2015 Goal Status: Active Ulcer/skin breakdown will have a volume reduction of 50% by week 8 Date Initiated: 07/30/2015 Goal Status: Active Ulcer/skin breakdown will have a volume reduction of 80% by week 12 Date Initiated: 07/30/2015 Goal Status: Active Ulcer/skin breakdown will heal within 14 weeks Date Initiated: 07/30/2015 Goal Status: Active Interventions: Assess patient/caregiver ability to obtain necessary supplies Assess patient/caregiver ability to perform ulcer/skin care regimen upon admission and as needed Provide education on ulcer and skin care Notes: Electronic Signature(s) Signed: 08/26/2015 4:43:39 PM By: Alric Quan Entered By: Alric Quan on 08/26/2015 10:27:18 Angela Patterson ([id number]) -------------------------------------------------------------------------------- Pain Assessment Details Patient Name: Angela Patterson. Date of Service: 08/26/2015 10:00 AM Medical Record Number: [id number] Patient Account Number: [id number] Date of Birth/Sex: 09/03/1920 (82 y.o. Female) Treating RN: Carolyne Fiscal, Debi Primary Care Physician: Einar Pheasant Other Clinician: Referring Physician: Einar Pheasant Treating Physician/Extender: Frann Rider in Treatment: 3 Active Problems Location of Pain Severity and Description of Pain Patient Has Paino No Site Locations Pain Management and Medication Current Pain Management: Electronic Signature(s) Signed:  08/26/2015 4:43:39 PM By: Alric Quan Entered By: Alric Quan on 08/26/2015 10:16:23 Angela Patterson ([id number]) -------------------------------------------------------------------------------- Patient/Caregiver Education Details Patient Name: Angela Patterson. Date of Service: 08/26/2015 10:00 AM Medical Record Number: [id number] Patient Account Number: [id number] Date of Birth/Gender: October 19, 1920 (82 y.o. Female) Treating RN: Carolyne Fiscal, Debi Primary Care Physician: Einar Pheasant Other Clinician: Referring Physician: Einar Pheasant Treating Physician/Extender: Frann Rider in Treatment: 3 Education Assessment Education Provided To: Patient Education Topics Provided Wound/Skin Impairment: Handouts: Other: change dressing as ordered Methods: Demonstration, Explain/Verbal Responses: State content correctly Electronic Signature(s) Signed: 08/26/2015 4:43:39 PM By: Alric Quan Entered By: Alric Quan on 08/26/2015 10:44:45 Angela Patterson ([id number]) -------------------------------------------------------------------------------- Wound Assessment Details Patient Name: Angela Patterson. Date of Service: 08/26/2015 10:00 AM Medical Record Number: [id number] Patient Account Number: [id number] Date of Birth/Sex: [birth year] (82 y.o. Female) Treating RN: Carolyne Fiscal, Debi Primary Care Physician: Einar Pheasant Other Clinician: Referring Physician: Einar Pheasant Treating Physician/Extender: Frann Rider in Treatment: 3 Wound Status Wound Number: 3 Primary Diabetic Wound/Ulcer of the Lower Etiology: Extremity Wound Location: Left Metatarsal head fifth - Plantar Wound Open Status: Wounding Event: Gradually Appeared Comorbid Cataracts, Glaucoma, Hypertension, Date Acquired: 07/01/2015 History: Type II Diabetes, History of pressure Weeks Of Treatment: 3 wounds, Neuropathy Clustered Wound: No Photos Photo Uploaded By: Alric Quan on 08/26/2015  13:26:09 Wound Measurements Length: (cm) 0.5 Width: (cm) 0.9 Depth: (cm) 0.3 Area: (cm) 0.353 Volume: (cm) 0.106 % Reduction in Area: 92.5% % Reduction in Volume: 88.7% Epithelialization: None Tunneling: No Undermining: Yes Starting Position (o'clock): 3 Maximum Distance: (cm) 0.3 Wound Description Classification: Grade 1 Wound Margin: Distinct, outline attached Exudate Amount: Large Exudate Type: Serosanguineous Exudate Color: red, brown Foul Odor After Cleansing: No Wound Bed  Angela Patterson, Angela Patterson ([id number]) Granulation Amount: Medium (34-66%) Exposed Structure Granulation Quality: Pink, Pale Fascia Exposed: No Necrotic Amount: Medium (34-66%) Fat Layer Exposed: No Necrotic Quality: Eschar, Adherent Slough Tendon Exposed: No Muscle Exposed: No Joint Exposed: No Bone Exposed: No Limited to Skin Breakdown Periwound Skin Texture Texture Color No Abnormalities Noted: No No Abnormalities Noted: No Callus: Yes Atrophie Blanche: No Crepitus: No Cyanosis: No Excoriation: No Ecchymosis: No Fluctuance: No Erythema: No Friable: No Hemosiderin Staining: No Induration: No Mottled: No Localized Edema: No Pallor: No Rash: No Rubor: No Scarring: No Temperature / Pain Moisture Temperature: No Abnormality No Abnormalities Noted: No Tenderness on Palpation: Yes Dry / Scaly: No Maceration: No Moist: Yes Wound Preparation Ulcer Cleansing: Rinsed/Irrigated with Saline Topical Anesthetic Applied: Other: lidocaine 4%, Treatment Notes Wound #3 (Left, Plantar Metatarsal head fifth) 1. Cleansed with: Clean wound with Normal Saline 2. Anesthetic Topical Lidocaine 4% cream to wound bed prior to debridement 4. Dressing Applied: Aquacel Ag Foam 5. Secondary Dressing Applied Gauze and Kerlix/Conform 7. 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 Date of Service: 08/26/2015 10:00 AM Medical Record Number: [id number] Patient Account Number: [id number] Date of Birth/Sex: 28-Jan-1921 (82 y.o. Female) Treating RN: Carolyne Fiscal, Debi Primary Care Physician: Einar Pheasant Other Clinician: Referring Physician: Einar Pheasant Treating Physician/Extender: Frann Rider in Treatment: 3 Vital Signs Time Taken: 10:16 Temperature (F): 97.5 Height (in): 65 Pulse (bpm): 68 Weight (lbs): 107 Respiratory Rate (breaths/min): 20 Body Mass Index (BMI): 17.8 Blood Pressure (mmHg): 152/69 Reference Range: 80 - 120 mg / dl Electronic Signature(s) Signed: 08/26/2015 4:43:39 PM By: Alric Quan Entered By: Alric Quan on 08/26/2015 10:18:59

## 2015-09-02 ENCOUNTER — Encounter: Payer: Medicare Other | Admitting: Surgery

## 2015-09-02 DIAGNOSIS — E1140 Type 2 diabetes mellitus with diabetic neuropathy, unspecified: Secondary | ICD-10-CM | POA: Diagnosis not present

## 2015-09-02 DIAGNOSIS — L97522 Non-pressure chronic ulcer of other part of left foot with fat layer exposed: Secondary | ICD-10-CM | POA: Diagnosis not present

## 2015-09-02 DIAGNOSIS — I10 Essential (primary) hypertension: Secondary | ICD-10-CM | POA: Diagnosis not present

## 2015-09-02 DIAGNOSIS — L97521 Non-pressure chronic ulcer of other part of left foot limited to breakdown of skin: Secondary | ICD-10-CM | POA: Diagnosis not present

## 2015-09-02 DIAGNOSIS — L84 Corns and callosities: Secondary | ICD-10-CM | POA: Diagnosis not present

## 2015-09-02 DIAGNOSIS — E11621 Type 2 diabetes mellitus with foot ulcer: Secondary | ICD-10-CM | POA: Diagnosis not present

## 2015-09-03 NOTE — Progress Notes (Signed)
REBCCA, KNIPFER ([id number]) Visit Report for 09/02/2015 Arrival Information Details Patient Name: Angela Patterson, Angela Patterson. Date of Service: 09/02/2015 3:30 PM Medical Record Number: [id number] Patient Account Number: [id number] Date of Birth/Sex: 1920/10/11 (82 y.o. Female) Treating RN: Carolyne Fiscal, Debi Primary Care Physician: Einar Pheasant Other Clinician: Referring Physician: Einar Pheasant Treating Physician/Extender: Frann Rider in Treatment: 4 Visit Information History Since Last Visit All ordered tests and consults were completed: No Patient Arrived: Ambulatory Added or deleted any medications: No Arrival Time: 15:30 Any new allergies or adverse reactions: No Accompanied By: daughter Had a fall or experienced change in No Transfer Assistance: None activities of daily living that may affect Patient Identification Verified: Yes risk of falls: Secondary Verification Process Yes Signs or symptoms of abuse/neglect since last No Completed: visito Patient Requires Transmission-Based No Hospitalized since last visit: No Precautions: Pain Present Now: No Patient Has Alerts: No Electronic Signature(s) Signed: 09/02/2015 5:59:16 PM By: Alric Quan Entered By: Alric Quan on 09/02/2015 15:32:34 Angela Patterson ([id number]) -------------------------------------------------------------------------------- Encounter Discharge Information Details Patient Name: Angela Patterson. Date of Service: 09/02/2015 3:30 PM Medical Record Number: [id number] Patient Account Number: [id number] Date of Birth/Sex: 1921-07-18 (82 y.o. Female) Treating RN: Carolyne Fiscal, Debi Primary Care Physician: Einar Pheasant Other Clinician: Referring Physician: Einar Pheasant Treating Physician/Extender: Frann Rider in Treatment: 4 Encounter Discharge Information Items Discharge Pain Level: 0 Discharge Condition: Stable Ambulatory Status: Ambulatory Discharge Destination: Home Transportation:  Private Auto Accompanied By: daughter Schedule Follow-up Appointment: Yes Medication Reconciliation completed and provided to Patient/Care Yes Layton Naves: Provided on Clinical Summary of Care: 09/02/2015 Form Type Recipient Paper Patient EG Electronic Signature(s) Signed: 09/02/2015 5:59:16 PM By: Alric Quan Previous Signature: 09/02/2015 4:06:54 PM Version By: Ruthine Dose Entered By: Alric Quan on 09/02/2015 16:12:04 Angela Patterson ([id number]) -------------------------------------------------------------------------------- Lower Extremity Assessment Details Patient Name: Angela Patterson. Date of Service: 09/02/2015 3:30 PM Medical Record Number: [id number] Patient Account Number: [id number] Date of Birth/Sex: 1920-12-17 (82 y.o. Female) Treating RN: Carolyne Fiscal, Debi Primary Care Physician: Einar Pheasant Other Clinician: Referring Physician: Einar Pheasant Treating Physician/Extender: Frann Rider in Treatment: 4 Vascular Assessment Pulses: Posterior Tibial Dorsalis Pedis Palpable: [Left:Yes] Extremity colors, hair growth, and conditions: Extremity Color: [Left:Normal] Temperature of Extremity: [Left:Warm] Capillary Refill: [Left:< 3 seconds] Toe Nail Assessment Left: Right: Thick: No Discolored: No Deformed: No Improper Length and Hygiene: No Electronic Signature(s) Signed: 09/02/2015 5:59:16 PM By: Alric Quan Entered By: Alric Quan on 09/02/2015 15:36:30 Angela Patterson ([id number]) -------------------------------------------------------------------------------- Multi Wound Chart Details Patient Name: Angela Patterson. Date of Service: 09/02/2015 3:30 PM Medical Record Number: [id number] Patient Account Number: [id number] Date of Birth/Sex: [birth year] (82 y.o. 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] Comorbid History: [3:Cataracts, Glaucoma, Hypertension, Type II Diabetes, History of pressure wounds, Neuropathy] [N/A:N/A] Date Acquired: [3:07/01/2015] [N/A:N/A] Weeks of Treatment: [3:4] [N/A:N/A] Wound Status: [3:Open] [N/A:N/A] Measurements L x W x D 0.8x0.7x0.3 [N/A:N/A] (cm) Area (cm) : [3:0.44] [N/A:N/A] Volume (cm) : [3:0.132] [N/A:N/A] % Reduction in Area: [3:90.70%] [N/A:N/A] % Reduction in Volume: 86.00% [N/A:N/A] Classification: [3:Grade 1] [N/A:N/A] Exudate Amount: [3:Large] [N/A:N/A] Exudate Type: [3:Serosanguineous] [N/A:N/A] Exudate Color: [3:red,  brown] [N/A:N/A] Wound Margin: [3:Distinct, outline attached N/A] Granulation Amount: [3:Medium (34-66%)] [N/A:N/A] Granulation Quality: [3:Pink, Pale] [N/A:N/A] Necrotic Amount: [3:Medium (34-66%)] [N/A:N/A] Necrotic Tissue: [3:Eschar, Adherent Slough N/A] Exposed Structures: [N/A:N/A] Fascia: No Fat: No Tendon: No Muscle: No Joint: No Bone: No Limited to Skin Breakdown Epithelialization: None N/A N/A Periwound Skin Texture: Callus: Yes N/A N/A Edema: No Excoriation: No Induration: No Crepitus: No Fluctuance: No Friable: No Rash: No Scarring: No Periwound Skin Moist: Yes N/A N/A Moisture: Maceration: No Dry/Scaly: No Periwound Skin Color: Atrophie Blanche: No N/A N/A Cyanosis: No Ecchymosis: No Erythema: No Hemosiderin Staining: No Mottled: No Pallor: No Rubor: No Temperature: No Abnormality N/A N/A Tenderness on Yes N/A N/A Palpation: Wound Preparation: Ulcer Cleansing: N/A  N/A Rinsed/Irrigated with Saline Topical Anesthetic Applied: Other: lidocaine 4% Treatment Notes Electronic Signature(s) Signed: 09/02/2015 5:59:16 PM By: Alric Quan Entered By: Alric Quan on 09/02/2015 15:41:57 Angela Patterson ([id number]) -------------------------------------------------------------------------------- [place] Details Patient Name: Angela Patterson. Date of Service: 09/02/2015 3:30 PM Medical Record Number: [id number] Patient Account Number: [id number] Date of Birth/Sex: Aug 31, 1920 (82 y.o. 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 Anxiety related to knowledge deficit of hyperbaric oxygen therapy and treatment procedures Discomfort related to temperature and humidity changes inside hyperbaric chamber Potential for barotraumas to ears, sinuses, teeth, and lungs or cerebral gas embolism related to changes in atmospheric pressure inside hyperbaric oxygen chamber Potential for oxygen toxicity seizures related to delivery of 100% oxygen at an increased atmospheric pressure Potential for pulmonary oxygen toxicity related to delivery of 100% oxygen at an increased atmospheric pressure Goals: Barotrauma will be prevented during HBO2 Date Initiated: 07/30/2015 Goal Status: Active Patient and/or family will be able to state/discuss factors appropriate to the management of their disease process during treatment Date Initiated: 07/30/2015 Goal Status: Active Patient will tolerate the hyperbaric oxygen therapy treatment Date Initiated: 07/30/2015 Goal Status: Active Patient will tolerate the internal climate of the chamber Date Initiated: 07/30/2015 Goal Status:  Active Patient/caregiver will verbalize understanding of HBO goals, rationale, procedures and potential hazards Date Initiated: 07/30/2015 Goal Status: Active Signs and symptoms of pulmonary oxygen toxicity will be recognized and promptly addressed Date Initiated: 07/30/2015 Goal Status: Active Signs and symptoms of seizure will be recognized and promptly addressed ; seizing patients will suffer no harm Date Initiated: 07/30/2015 Angela Patterson ([id number]) Goal Status: Active Interventions: Administer a five (5) minute air break for patient if signs and symptoms of seizure appear and notify the hyperbaric physician Administer a ten (10) minute air break for patient if signs and symptoms of seizure appear and notify the hyperbaric physician Administer decongestants, per physician orders, prior to HBO2 Administer the correct therapeutic gas delivery based on the patients needs and limitations, per physician order Assess and provide for patientos comfort related to the hyperbaric environment and equalization of middle ear Assess for signs and symptoms related to adverse events, including but not limited to confinement anxiety, pneumothorax, oxygen toxicity and baurotrauma Assess patient for any history of confinement anxiety Assess patient's knowledge and expectations regarding hyperbaric medicine and provide education related to the hyperbaric environment, goals of treatment and prevention of adverse events Implement protocols to decrease risk of pneumothorax in high risk patients Notes: Orientation to the Wound Care Program Nursing Diagnoses: Knowledge deficit related to the  wound healing center program Goals: Patient/caregiver will verbalize understanding of the [place] Date Initiated: 07/30/2015 Goal Status: Active Interventions: Provide education on orientation to the wound center Notes: Wound/Skin Impairment Nursing Diagnoses: Impaired tissue  integrity Knowledge deficit related to smoking impact on wound healing Goals: Patient will have a decrease in wound volume by X% from date: (specify in notes) Date Initiated: 07/30/2015 Goal Status: Active Patient/caregiver will verbalize understanding of skin care regimen KRISTARA, TARRO ([id number]) Date Initiated: 07/30/2015 Goal Status: Active Ulcer/skin breakdown will have a volume reduction of 30% by week 4 Date Initiated: 07/30/2015 Goal Status: Active Ulcer/skin breakdown will have a volume reduction of 50% by week 8 Date Initiated: 07/30/2015 Goal Status: Active Ulcer/skin breakdown will have a volume reduction of 80% by week 12 Date Initiated: 07/30/2015 Goal Status: Active Ulcer/skin breakdown will heal within 14 weeks Date Initiated: 07/30/2015 Goal Status: Active Interventions: Assess patient/caregiver ability to obtain necessary supplies Assess patient/caregiver ability to perform ulcer/skin care regimen upon admission and as needed Provide education on ulcer and skin care Notes: Electronic Signature(s) Signed: 09/02/2015 5:59:16 PM By: Alric Quan Entered By: Alric Quan on 09/02/2015 15:41:50 Angela Patterson ([id number]) -------------------------------------------------------------------------------- Pain Assessment Details Patient Name: Angela Patterson. Date of Service: 09/02/2015 3:30 PM Medical Record Number: [id number] Patient Account Number: [id number] Date of Birth/Sex: 1920-08-20 (82 y.o. Female) Treating RN: Carolyne Fiscal, Debi Primary Care Physician: Einar Pheasant Other Clinician: Referring Physician: Einar Pheasant Treating Physician/Extender: Frann Rider in Treatment: 4 Active Problems Location of Pain Severity and Description of Pain Patient Has Paino No Site Locations Pain Management and Medication Current Pain Management: Electronic Signature(s) Signed: 09/02/2015 5:59:16 PM By: Alric Quan Entered By: Alric Quan on  09/02/2015 15:32:40 Angela Patterson ([id number]) -------------------------------------------------------------------------------- Patient/Caregiver Education Details Patient Name: Angela Patterson. Date of Service: 09/02/2015 3:30 PM Medical Record Number: [id number] Patient Account Number: [id number] Date of Birth/Gender: Sep 29, 1920 (82 y.o. Female) Treating RN: Carolyne Fiscal, Debi Primary Care Physician: Einar Pheasant Other Clinician: Referring Physician: Einar Pheasant Treating Physician/Extender: Frann Rider in Treatment: 4 Education Assessment Education Provided To: Patient and Caregiver Education Topics Provided Wound/Skin Impairment: Handouts: Other: change dressing as directed Methods: Demonstration, Explain/Verbal Responses: State content correctly Electronic Signature(s) Signed: 09/02/2015 5:59:16 PM By: Alric Quan Entered By: Alric Quan on 09/02/2015 16:12:21 Angela Patterson ([id number]) -------------------------------------------------------------------------------- Wound Assessment Details Patient Name: Angela Patterson. Date of Service: 09/02/2015 3:30 PM Medical Record Number: [id number] Patient Account Number: [id number] Date of Birth/Sex: 20-Nov-1920 (82 y.o. Female) Treating RN: Carolyne Fiscal, Debi Primary Care Physician: Einar Pheasant Other Clinician: Referring Physician: Einar Pheasant Treating Physician/Extender: Frann Rider in Treatment: 4 Wound Status Wound Number: 3 Primary Diabetic Wound/Ulcer of the Lower Etiology: Extremity Wound Location: Left Metatarsal head fifth - Plantar Wound Open Status: Wounding Event: Gradually Appeared Comorbid Cataracts, Glaucoma, Hypertension, Date Acquired: 07/01/2015 History: Type II Diabetes, History of pressure Weeks Of Treatment: 4 wounds, Neuropathy Clustered Wound: No Photos Photo Uploaded By: Alric Quan on 09/02/2015 16:30:29 Wound Measurements Length: (cm) 0.8 Width: (cm)  0.7 Depth: (cm) 0.3 Area: (cm) 0.44 Volume: (cm) 0.132 % Reduction in Area: 90.7% % Reduction in Volume: 86% Epithelialization: None Tunneling: No Undermining: No Wound Description Classification: Grade 1 Wound Margin: Distinct, outline attached Exudate Amount: Large Exudate Type: Serosanguineous Exudate Color: red, brown Foul Odor After Cleansing: No Wound Bed Granulation Amount: Medium (34-66%) Exposed Structure Granulation Quality: Pink, Pale Fascia Exposed: No Necrotic Amount: Medium (34-66%) Fat Layer Exposed: No Angela Patterson ([id number]) Necrotic  Quality: Eschar, Adherent Slough Tendon Exposed: No Muscle Exposed: No Joint Exposed: No Bone Exposed: No Limited to Skin Breakdown Periwound Skin Texture Texture Color No Abnormalities Noted: No No Abnormalities Noted: No Callus: Yes Atrophie Blanche: No Crepitus: No Cyanosis: No Excoriation: No Ecchymosis: No Fluctuance: No Erythema: No Friable: No Hemosiderin Staining: No Induration: No Mottled: No Localized Edema: No Pallor: No Rash: No Rubor: No Scarring: No Temperature / Pain Moisture Temperature: No Abnormality No Abnormalities Noted: No Tenderness on Palpation: Yes Dry / Scaly: No Maceration: No Moist: Yes Wound Preparation Ulcer Cleansing: Rinsed/Irrigated with Saline Topical Anesthetic Applied: Other: lidocaine 4%, Treatment Notes Wound #3 (Left, Plantar Metatarsal head fifth) 1. Cleansed with: Clean wound with Normal Saline 2. Anesthetic Topical Lidocaine 4% cream to wound bed prior to debridement 4. Dressing Applied: Aquacel Ag 5. Secondary Dressing Applied Gauze and Kerlix/Conform 7. Secured with Tape Notes darco front offloader, felt Electronic Signature(s) Signed: 09/02/2015 5:59:16 PM By: Alric Quan Entered By: Alric Quan on 09/02/2015 15:40:15 Angela Patterson ([id number]) NALLA, TAULBEE  ([id number]) -------------------------------------------------------------------------------- Vitals Details Patient Name: Angela Patterson. Date of Service: 09/02/2015 3:30 PM Medical Record Number: [id number] Patient Account Number: [id number] Date of Birth/Sex: 1921/05/18 (82 y.o. Female) Treating RN: Carolyne Fiscal, Debi Primary Care Physician: Einar Pheasant Other Clinician: Referring Physician: Einar Pheasant Treating Physician/Extender: Frann Rider in Treatment: 4 Vital Signs Time Taken: 15:32 Temperature (F): 98.1 Height (in): 65 Pulse (bpm): 79 Weight (lbs): 107 Respiratory Rate (breaths/min): 20 Body Mass Index (BMI): 17.8 Blood Pressure (mmHg): 164/73 Reference Range: 80 - 120 mg / dl Electronic Signature(s) Signed: 09/02/2015 5:59:16 PM By: Alric Quan Entered By: Alric Quan on 09/02/2015 15:35:25

## 2015-09-03 NOTE — Progress Notes (Signed)
GABBRIELLA, STROMGREN ([id number]) Visit Report for 09/02/2015 Chief Complaint Document Details Patient Name: Angela Patterson, Angela Patterson. Date of Service: 09/02/2015 3:30 PM Medical Record Number: [id number] Patient Account Number: [id number] Date of Birth/Sex: 06-16-1921 (82 y.o. Female) Treating RN: Carolyne Fiscal, Debi Primary Care Physician: Einar Pheasant Other Clinician: Referring Physician: Einar Pheasant Treating Physician/Extender: Frann Rider in Treatment: 4 Information Obtained from: Patient Chief Complaint Patient presents to the wound care center for a consult due non healing wound Pleasant 82 year old comes along with her daughter with a recurrent ulceration and callosity on her left foot. she has had this for about 3 weeks now. Electronic Signature(s) Signed: 09/02/2015 4:24:24 PM By: Christin Fudge MD, FACS Entered By: Christin Fudge on 09/02/2015 16:24:24 Angela Patterson ([id number]) -------------------------------------------------------------------------------- Debridement Details Patient Name: Angela Patterson. Date of Service: 09/02/2015 3:30 PM Medical Record Number: [id number] Patient Account Number: [id number] Date of Birth/Sex: 1921-07-11 (82 y.o. Female) Treating RN: Carolyne Fiscal, Debi Primary Care Physician: Einar Pheasant Other Clinician: Referring Physician: Einar Pheasant Treating Physician/Extender: Frann Rider in Treatment: 4 Debridement Performed for Wound #3 Left,Plantar Metatarsal head fifth Assessment: Performed By: Physician Christin Fudge, MD Debridement: Debridement Pre-procedure Yes Verification/Time Out Taken: Start Time: 15:47 Pain Control: Other : lidocaine 4% cream Level: Skin/Subcutaneous Tissue Total Area Debrided (L x 0.8 (cm) x 0.7 (cm) = 0.56 (cm) W): Tissue and other Viable, Non-Viable, Callus, Fibrin/Slough, Other, Subcutaneous material debrided: Instrument: Curette Bleeding: Minimum Hemostasis Achieved: Pressure End Time:  15:52 Procedural Pain: 0 Post Procedural Pain: 0 Response to Treatment: Procedure was tolerated well Post Debridement Measurements of Total Wound Length: (cm) 0.8 Width: (cm) 0.7 Depth: (cm) 0.3 Volume: (cm) 0.132 Post Procedure Diagnosis Same as Pre-procedure Electronic Signature(s) Signed: 09/02/2015 4:24:11 PM By: Christin Fudge MD, FACS Signed: 09/02/2015 5:59:16 PM By: Alric Quan Entered By: Christin Fudge on 09/02/2015 16:24:11 Angela Patterson ([id number]) -------------------------------------------------------------------------------- HPI Details Patient Name: Angela Patterson. Date of Service: 09/02/2015 3:30 PM Medical Record Number: [id number] Patient Account Number: [id number] Date of Birth/Sex: December 31, 1920 (82 y.o. Female) Treating RN: Carolyne Fiscal, Debi Primary Care Physician: Einar Pheasant Other Clinician: Referring Physician: Einar Pheasant Treating Physician/Extender: Frann Rider in Treatment: 4 History of Present Illness Location: left foot Quality: mild pain occasionally. Severity: the problem is been getting worse for the last 3 weeks Duration: she's had this for almost a year now on and off. Timing: pain is intermittent and occasional. Context: she was seen by me earlier in March and again in May and was healed by early June 2016 Modifying Factors: she has had diabetic shoes made and is compliant with wearing these. Associated Signs and Symptoms: worse on standing on her feet for too long. HPI Description: this very pleasant 82 year old patient who is extremely active and ambulating all day has had problems with the left foot for over a year. Her daughter who is her caregiver says she drives around and even goes around and does her chores by herself. this is the third recurrence and after the last time we saw in June and discharged her her daughter has got her to pace of diabetic shoes and she's been wearing these regularly. She has been on the shoes  for about 6 months now. Her diabetes is under control and her blood sugars run from a anywhere between 110 to 130 and she checks them twice a day. no fever or discharge. During her initial visit, an x-ray of her left foot was done on 09/26/2014. There was no cortical erosion noted there  was mild osteopenia in the distal first metatarsal. Radiologist has recommended a MRI of the left foot to exclude osteomyelitis. MRI done on 10/09/2014 reveals that the area where she had a phlegmon is without osteomyelitis. the bone marrow and other areas are compatible with reactive edema rather than osteomyelitis. 08/06/2015 -- x-ray of the left foot done on 07/30/2015 shows IMPRESSION:Soft tissue wound with a bandage is noted over the plantar aspect of the distal left foot. Diffuse osteopenia degenerative change. No acute bony abnormality Electronic Signature(s) Signed: 09/02/2015 4:24:29 PM By: Christin Fudge MD, FACS Entered By: Christin Fudge on 09/02/2015 16:24:29 Angela Patterson ([id number]) -------------------------------------------------------------------------------- Physical Exam Details Patient Name: Angela Patterson. Date of Service: 09/02/2015 3:30 PM Medical Record Number: [id number] Patient Account Number: [id number] Date of Birth/Sex: Jul 21, 1920 (82 y.o. Female) Treating RN: Carolyne Fiscal, Debi Primary Care Physician: Einar Pheasant Other Clinician: Referring Physician: Einar Pheasant Treating Physician/Extender: Frann Rider in Treatment: 4 Constitutional . Pulse regular. Respirations normal and unlabored. Afebrile. . Eyes Nonicteric. Reactive to light. Ears, Nose, Mouth, and Throat Lips, teeth, and gums WNL.Marland Kitchen Moist mucosa without lesions. Neck supple and nontender. No palpable supraclavicular or cervical adenopathy. Normal sized without goiter. Respiratory WNL. No retractions.. Cardiovascular Pedal Pulses WNL. No clubbing, cyanosis or edema. Lymphatic No adneopathy. No  adenopathy. No adenopathy. Musculoskeletal Adexa without tenderness or enlargement.. Digits and nails w/o clubbing, cyanosis, infection, petechiae, ischemia, or inflammatory conditions.. Integumentary (Hair, Skin) No suspicious lesions. No crepitus or fluctuance. No peri-wound warmth or erythema. No masses.Marland Kitchen Psychiatric Judgement and insight Intact.. No evidence of depression, anxiety, or agitation.. Notes a lot of subcutaneous debris and surrounding callus was sharply removed with a curette and brisk bleeding was controlled with pressure. Electronic Signature(s) Signed: 09/02/2015 4:24:55 PM By: Christin Fudge MD, FACS Entered By: Christin Fudge on 09/02/2015 16:24:55 Angela Patterson ([id number]) -------------------------------------------------------------------------------- Physician Orders Details Patient Name: Angela Patterson. Date of Service: 09/02/2015 3:30 PM Medical Record Number: [id number] Patient Account Number: [id number] Date of Birth/Sex: 02-04-1921 (82 y.o. Female) Treating RN: Carolyne Fiscal, Debi Primary Care Physician: Einar Pheasant Other Clinician: Referring Physician: Einar Pheasant Treating Physician/Extender: Frann Rider in Treatment: 4 Verbal / Phone Orders: Yes Clinician: Carolyne Fiscal, Debi Read Back and Verified: Yes Diagnosis Coding Wound Cleansing Wound #3 Left,Plantar Metatarsal head fifth o Cleanse wound with mild soap and water o May Shower, gently pat wound dry prior to applying new dressing. Anesthetic Wound #3 Left,Plantar Metatarsal head fifth o Topical Lidocaine 4% cream applied to wound bed prior to debridement Primary Wound Dressing Wound #3 Left,Plantar Metatarsal head fifth o Aquacel Ag Secondary Dressing Wound #3 Left,Plantar Metatarsal head fifth o Gauze and Kerlix/Conform o Foam - felt Dressing Change Frequency Wound #3 Left,Plantar Metatarsal head fifth o Change dressing every other day. Follow-up Appointments Wound  #3 Left,Plantar Metatarsal head fifth o Return Appointment in 1 week. Off-Loading Wound #3 Left,Plantar Metatarsal head fifth o Open toe surgical shoe to: - front offloader darco Additional Orders / Instructions Wound #3 Left,Plantar Metatarsal head fifth o Increase protein intake. o Activity as tolerated o Other: - Keep blood sugars under control Angela Patterson, Angela Patterson ([id number]) Electronic Signature(s) Signed: 09/02/2015 4:26:55 PM By: Christin Fudge MD, FACS Signed: 09/02/2015 5:59:16 PM By: Alric Quan Entered By: Alric Quan on 09/02/2015 16:09:29 Angela Patterson ([id number]) -------------------------------------------------------------------------------- Progress Note Details Patient Name: Angela Patterson. Date of Service: 09/02/2015 3:30 PM Medical Record Number: [id number] Patient Account Number: [id number] Date of Birth/Sex: 10-13-1920 (82 y.o. Female) Treating RN: Carolyne Fiscal, Debi Primary  Care Physician: Einar Pheasant Other Clinician: Referring Physician: Einar Pheasant Treating Physician/Extender: Frann Rider in Treatment: 4 Subjective Chief Complaint Information obtained from Patient Patient presents to the wound care center for a consult due non healing wound Pleasant 82 year old comes along with her daughter with a recurrent ulceration and callosity on her left foot. she has had this for about 3 weeks now. History of Present Illness (HPI) The following HPI elements were documented for the patient's wound: Location: left foot Quality: mild pain occasionally. Severity: the problem is been getting worse for the last 3 weeks Duration: she's had this for almost a year now on and off. Timing: pain is intermittent and occasional. Context: she was seen by me earlier in March and again in May and was healed by early June 2016 Modifying Factors: she has had diabetic shoes made and is compliant with wearing these. Associated Signs and Symptoms: worse on  standing on her feet for too long. this very pleasant 82 year old patient who is extremely active and ambulating all day has had problems with the left foot for over a year. Her daughter who is her caregiver says she drives around and even goes around and does her chores by herself. this is the third recurrence and after the last time we saw in June and discharged her her daughter has got her to pace of diabetic shoes and she's been wearing these regularly. She has been on the shoes for about 6 months now. Her diabetes is under control and her blood sugars run from a anywhere between 110 to 130 and she checks them twice a day. no fever or discharge. During her initial visit, an x-ray of her left foot was done on 09/26/2014. There was no cortical erosion noted there was mild osteopenia in the distal first metatarsal. Radiologist has recommended a MRI of the left foot to exclude osteomyelitis. MRI done on 10/09/2014 reveals that the area where she had a phlegmon is without osteomyelitis. the bone marrow and other areas are compatible with reactive edema rather than osteomyelitis. 08/06/2015 -- x-ray of the left foot done on 07/30/2015 shows IMPRESSION:Soft tissue wound with a bandage is noted over the plantar aspect of the distal left foot. Diffuse osteopenia degenerative change. No acute bony abnormality Angela Patterson, Angela Patterson. ([id number]) Objective Constitutional Pulse regular. Respirations normal and unlabored. Afebrile. Vitals Time Taken: 3:32 PM, Height: 65 in, Weight: 107 lbs, BMI: 17.8, Temperature: 98.1 F, Pulse: 79 bpm, Respiratory Rate: 20 breaths/min, Blood Pressure: 164/73 mmHg. Eyes Nonicteric. Reactive to light. Ears, Nose, Mouth, and Throat Lips, teeth, and gums WNL.Marland Kitchen Moist mucosa without lesions. Neck supple and nontender. No palpable supraclavicular or cervical adenopathy. Normal sized without goiter. Respiratory WNL. No retractions.. Cardiovascular Pedal Pulses WNL. No  clubbing, cyanosis or edema. Lymphatic No adneopathy. No adenopathy. No adenopathy. Musculoskeletal Adexa without tenderness or enlargement.. Digits and nails w/o clubbing, cyanosis, infection, petechiae, ischemia, or inflammatory conditions.Marland Kitchen Psychiatric Judgement and insight Intact.. No evidence of depression, anxiety, or agitation.. General Notes: a lot of subcutaneous debris and surrounding callus was sharply removed with a curette and brisk bleeding was controlled with pressure. Integumentary (Hair, Skin) No suspicious lesions. No crepitus or fluctuance. No peri-wound warmth or erythema. No masses.. Wound #3 status is Open. Original cause of wound was Gradually Appeared. The wound is located on the Left,Plantar Metatarsal head fifth. The wound measures 0.8cm length x 0.7cm width x 0.3cm depth; 0.44cm^2 area and 0.132cm^3 volume. The wound is limited to skin breakdown. There is no  tunneling or undermining noted. There is a large amount of serosanguineous drainage noted. The wound margin is distinct with the outline attached to the wound base. There is medium (34-66%) pink, pale granulation within the wound bed. There is a medium (34-66%) amount of necrotic tissue within the wound bed including Angela Patterson, Angela Patterson. ([id number]) Eschar and Adherent Slough. The periwound skin appearance exhibited: Callus, Moist. The periwound skin appearance did not exhibit: Crepitus, Excoriation, Fluctuance, Friable, Induration, Localized Edema, Rash, Scarring, Dry/Scaly, Maceration, Atrophie Blanche, Cyanosis, Ecchymosis, Hemosiderin Staining, Mottled, Pallor, Rubor, Erythema. Periwound temperature was noted as No Abnormality. The periwound has tenderness on palpation. Procedures Wound #3 Wound #3 is a Diabetic Wound/Ulcer of the Lower Extremity located on the Left,Plantar Metatarsal head fifth . There was a Skin/Subcutaneous Tissue Debridement [id number]) debridement with total area of 0.56 sq cm  performed by Christin Fudge, MD. with the following instrument(s): Curette to remove Viable and Non-Viable tissue/material including Fibrin/Slough, Other, Callus, and Subcutaneous after achieving pain control using Other (lidocaine 4% cream). A time out was conducted prior to the start of the procedure. A Minimum amount of bleeding was controlled with Pressure. The procedure was tolerated well with a pain level of 0 throughout and a pain level of 0 following the procedure. Post Debridement Measurements: 0.8cm length x 0.7cm width x 0.3cm depth; 0.132cm^3 volume. Post procedure Diagnosis Wound #3: Same as Pre-Procedure Plan Wound Cleansing: Wound #3 Left,Plantar Metatarsal head fifth: Cleanse wound with mild soap and water May Shower, gently pat wound dry prior to applying new dressing. Anesthetic: Wound #3 Left,Plantar Metatarsal head fifth: Topical Lidocaine 4% cream applied to wound bed prior to debridement Primary Wound Dressing: Wound #3 Left,Plantar Metatarsal head fifth: Aquacel Ag Secondary Dressing: Wound #3 Left,Plantar Metatarsal head fifth: Gauze and Kerlix/Conform Foam - felt Dressing Change Frequency: Wound #3 Left,Plantar Metatarsal head fifth: Change dressing every other day. Follow-up Appointments: Wound #3 Left,Plantar Metatarsal head fifth: Return Appointment in 1 week. Angela Patterson, Angela Patterson ([id number]) Off-Loading: Wound #3 Left,Plantar Metatarsal head fifth: Open toe surgical shoe to: - front offloader darco Additional Orders / Instructions: Wound #3 Left,Plantar Metatarsal head fifth: Increase protein intake. Activity as tolerated Other: - Keep blood sugars under control I have recommended silver alginate with a offloading felt ring around it. She will also use a Darco front off loading shoe. time around she has not done so well and if she does not continue to improve I may need to order a DH walking boot. She is unable to tolerate a total contact cast as she is  very unsteady and her feet. She will come back to see me every week. Electronic Signature(s) Signed: 09/02/2015 4:25:48 PM By: Christin Fudge MD, FACS Entered By: Christin Fudge on 09/02/2015 16:25:48 Angela Patterson ([id number]) -------------------------------------------------------------------------------- SuperBill Details Patient Name: Angela Patterson. Date of Service: 09/02/2015 Medical Record Number: [id number] Patient Account Number: [id number] Date of Birth/Sex: 12-Dec-1920 (82 y.o. Female) Treating RN: Carolyne Fiscal, Debi Primary Care Physician: Einar Pheasant Other Clinician: Referring Physician: Einar Pheasant Treating Physician/Extender: Frann Rider in Treatment: 4 Diagnosis Coding ICD-10 Codes Code Description E11.621 Type 2 diabetes mellitus with 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 with fat layer exposed L84 Corns and callosities Facility Procedures CPT4 Code Description: [id number] - DEB SUBQ TISSUE 20 SQ CM/< ICD-10 Description Diagnosis E11.621 Type 2 diabetes mellitus with 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 L84 Corns and callosities Modifier: with fat lay Quantity:  1 er exposed Physician Procedures CPT4 Code Description: [id number] - WC PHYS SUBQ TISS 20 SQ CM ICD-10 Description Diagnosis E11.621 Type 2 diabetes mellitus with 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 L84 Corns and callosities Modifier: with fat laye Quantity: 1 r exposed Electronic Signature(s) Signed: 09/02/2015 4:25:56 PM By: Christin Fudge MD, FACS Entered By: Christin Fudge on 09/02/2015 16:25:56

## 2015-09-09 ENCOUNTER — Encounter: Payer: Medicare Other | Admitting: Surgery

## 2015-09-09 DIAGNOSIS — L84 Corns and callosities: Secondary | ICD-10-CM | POA: Diagnosis not present

## 2015-09-09 DIAGNOSIS — E11621 Type 2 diabetes mellitus with foot ulcer: Secondary | ICD-10-CM | POA: Diagnosis not present

## 2015-09-09 DIAGNOSIS — I10 Essential (primary) hypertension: Secondary | ICD-10-CM | POA: Diagnosis not present

## 2015-09-09 DIAGNOSIS — L97522 Non-pressure chronic ulcer of other part of left foot with fat layer exposed: Secondary | ICD-10-CM | POA: Diagnosis not present

## 2015-09-09 DIAGNOSIS — L97521 Non-pressure chronic ulcer of other part of left foot limited to breakdown of skin: Secondary | ICD-10-CM | POA: Diagnosis not present

## 2015-09-09 DIAGNOSIS — E1140 Type 2 diabetes mellitus with diabetic neuropathy, unspecified: Secondary | ICD-10-CM | POA: Diagnosis not present

## 2015-09-10 ENCOUNTER — Ambulatory Visit: Payer: Medicare Other | Admitting: Sports Medicine

## 2015-09-10 NOTE — Progress Notes (Signed)
Angela Patterson, Angela Patterson ([id number]) Visit Report for 09/09/2015 Arrival Information Details Patient Name: Angela Patterson, RAK. Date of Service: 09/09/2015 3:30 PM Medical Record Number: [id number] Patient Account Number: [id number] Date of Birth/Sex: [birth year] (82 y.o. Female) Treating RN: Carolyne Fiscal, Debi Primary Care Physician: Einar Pheasant Other Clinician: Referring Physician: Einar Pheasant Treating Physician/Extender: Frann Rider in Treatment: 5 Visit Information History Since Last Visit All ordered tests and consults were completed: No Patient Arrived: Ambulatory Added or deleted any medications: No Arrival Time: 15:29 Any new allergies or adverse reactions: No Accompanied By: son and daughter in law Had a fall or experienced change in No activities of daily living that may affect Transfer Assistance: None risk of falls: Patient Identification Verified: Yes Signs or symptoms of abuse/neglect since last No Secondary Verification Process Yes visito Completed: Hospitalized since last visit: No Patient Requires Transmission- No Pain Present Now: No Based Precautions: Patient Has Alerts: No Electronic Signature(s) Signed: 09/09/2015 4:54:11 PM By: Rebecca Eaton, RN, Sendra Entered By: Rebecca Eaton RN, Sendra on 09/09/2015 16:23:08 Angela Patterson ([id number]) -------------------------------------------------------------------------------- Encounter Discharge Information Details Patient Name: Angela Patterson. Date of Service: 09/09/2015 3:30 PM Medical Record Number: [id number] Patient Account Number: [id number] Date of Birth/Sex: Jul 16, 1921 (82 y.o. Female) Treating RN: Carolyne Fiscal, Debi Primary Care Physician: Einar Pheasant Other Clinician: Referring Physician: Einar Pheasant Treating Physician/Extender: Frann Rider in Treatment: 5 Encounter Discharge Information Items Discharge Pain Level: 0 Discharge Condition: Stable Ambulatory Status: Ambulatory Discharge  Destination: Home Transportation: Private Auto son and Accompanied By: daughter in law Schedule Follow-up Appointment: Yes Medication Reconciliation completed and provided to Patient/Care Yes Hamzah Savoca: Clinical Summary of Care: Electronic Signature(s) Signed: 09/09/2015 4:54:11 PM By: Rebecca Eaton, RN, Sendra Entered By: Rebecca Eaton, RN, Sendra on 09/09/2015 16:22:35 Angela Patterson ([id number]) -------------------------------------------------------------------------------- Lower Extremity Assessment Details Patient Name: Angela Patterson. Date of Service: 09/09/2015 3:30 PM Medical Record Number: [id number] Patient Account Number: [id number] Date of Birth/Sex: 04-14-1921 (82 y.o. Female) Treating RN: Carolyne Fiscal, Debi Primary Care Physician: Einar Pheasant Other Clinician: Referring Physician: Einar Pheasant Treating Physician/Extender: Frann Rider in Treatment: 5 Vascular Assessment Pulses: Posterior Tibial Dorsalis Pedis Palpable: [Left:Yes] Extremity colors, hair growth, and conditions: Extremity Color: [Left:Normal] Temperature of Extremity: [Left:Warm] Capillary Refill: [Left:< 3 seconds] Toe Nail Assessment Left: Right: Thick: No Discolored: No Deformed: No Improper Length and Hygiene: No Electronic Signature(s) Signed: 09/09/2015 4:54:11 PM By: Ardean Larsen Signed: 09/10/2015 8:19:40 AM By: Alric Quan Entered By: Rebecca Eaton RN, Sendra on 09/09/2015 16:23:27 Angela Patterson ([id number]) -------------------------------------------------------------------------------- Multi Wound Chart Details Patient Name: Angela Patterson. Date of Service: 09/09/2015 3:30 PM Medical Record Number: [id number] Patient Account Number: [id number] Date of Birth/Sex: [birth year] (82 y.o. 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] Comorbid History: [3:Cataracts, Glaucoma, Hypertension, Type II Diabetes, History of pressure wounds, Neuropathy] [N/A:N/A] Date Acquired: [3:07/01/2015] [N/A:N/A] Weeks of Treatment: [3:5] [N/A:N/A] Wound Status: [3:Open] [N/A:N/A] Measurements L x W x D 0.4x0.4x0.3 [N/A:N/A] (cm) Area (cm) : [3:0.126] [N/A:N/A] Volume (cm) : [3:0.038] [N/A:N/A] % Reduction in Area: [3:97.30%] [N/A:N/A] % Reduction in Volume: 96.00% [N/A:N/A] Starting Position 1 12 (o'clock): Ending Position 1 [3:12] (o'clock): Maximum  Distance 1 1.4 (cm): Undermining: [3:Yes] [N/A:N/A] Classification: [3:Grade 1] [N/A:N/A] Exudate Amount: [3:Large] [N/A:N/A] Exudate Type: [3:Serosanguineous] [N/A:N/A] Exudate Color: red, brown N/A N/A Wound Margin: Distinct, outline attached N/A N/A Granulation Amount: Large (67-100%) N/A N/A Granulation Quality: Red, Pink N/A N/A Necrotic Amount: Small (1-33%) N/A N/A Exposed Structures: Fascia: No N/A N/A Fat: No Tendon: No Muscle: No Joint: No Bone: No Limited to Skin Breakdown Epithelialization: None N/A N/A Debridement: Debridement [id number] N/A N/A [id number]) Time-Out Taken: Yes N/A N/A Pain Control: Other N/A N/A Tissue Debrided: Fibrin/Slough, Exudates, N/A N/A Callus, Subcutaneous Level: Skin/Subcutaneous N/A N/A Tissue Debridement Area (sq 0.16 N/A N/A cm): Instrument: Forceps, Scissors N/A N/A Bleeding: Large N/A N/A Hemostasis Achieved: Silver Nitrate N/A N/A Procedural Pain: 0 N/A N/A Post Procedural Pain: 0 N/A N/A Debridement Treatment Procedure  was tolerated N/A N/A Response: well Post Debridement 0.8x2x0.2 N/A N/A Measurements L x W x D (cm) Post Debridement 0.251 N/A N/A Volume: (cm) Periwound Skin Texture: Callus: Yes N/A N/A Edema: No Excoriation: No Induration: No Crepitus: No Fluctuance: No Friable: No Rash: No Scarring: No Periwound Skin Moist: Yes N/A N/A Moisture: Maceration: No Dry/Scaly: No Periwound Skin Color: Atrophie Blanche: No N/A N/A Cyanosis: No Angela Patterson, Angela Patterson ([id number]) Ecchymosis: No Erythema: No Hemosiderin Staining: No Mottled: No Pallor: No Rubor: No Temperature: No Abnormality N/A N/A Tenderness on Yes N/A N/A Palpation: Wound Preparation: Ulcer Cleansing: N/A N/A Rinsed/Irrigated with Saline Topical Anesthetic Applied: Other: lidocaine 4% Procedures Performed: Debridement N/A N/A Treatment Notes Wound #3 (Left, Plantar Metatarsal head fifth) 1. Cleansed with: Clean wound with Normal Saline 2. Anesthetic Topical Lidocaine 4% cream to wound bed prior to debridement 3. Peri-wound Care: Skin Prep 4. Dressing Applied: Aquacel Ag 5. Secondary Dressing Applied Foam Gauze and Kerlix/Conform 6. Footwear/Offloading device applied Felt/Foam 7. Secured with Paper tape Notes darco front Dietitian) Signed: 09/09/2015 4:54:11 PM By: Rebecca Eaton, RN, Sendra Entered By: Rebecca Eaton RN, Sendra on 09/09/2015 16:23:51 Angela Patterson ([id number]) -------------------------------------------------------------------------------- [place] Details Patient Name: Angela Patterson. Date of Service: 09/09/2015 3:30 PM Medical Record Number: [id number] Patient Account Number: [id number] Date of Birth/Sex: 1920/07/23 (82 y.o. 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 Anxiety related to knowledge deficit of hyperbaric oxygen therapy and treatment procedures Discomfort related to temperature and humidity changes inside hyperbaric chamber Potential for barotraumas to ears, sinuses, teeth, and lungs or cerebral gas embolism related to changes in atmospheric pressure inside hyperbaric oxygen chamber Potential for oxygen toxicity seizures related to delivery of 100% oxygen at an increased atmospheric pressure Potential for pulmonary oxygen toxicity related to delivery of 100% oxygen at an increased atmospheric pressure Goals: Barotrauma will be prevented during HBO2 Date Initiated: 07/30/2015 Goal Status: Active Patient and/or family will be able to state/discuss factors appropriate to the management of their disease process during treatment Date Initiated: 07/30/2015 Goal Status: Active Patient will tolerate the hyperbaric oxygen therapy treatment Date Initiated: 07/30/2015 Goal Status: Active Patient will tolerate the internal climate of the chamber Date Initiated: 07/30/2015 Goal Status: Active Patient/caregiver will verbalize understanding of HBO goals, rationale, procedures and potential hazards Date Initiated: 07/30/2015 Goal Status: Active Signs and symptoms of pulmonary oxygen toxicity will be recognized and promptly addressed Date Initiated: 07/30/2015 Goal Status: Active Signs and symptoms of seizure will be  recognized and promptly addressed ; seizing patients will suffer no harm Date Initiated: 07/30/2015 Angela Patterson ([id number]) Goal Status: Active Interventions: Administer a five (5) minute air break for patient if signs and symptoms of seizure appear and notify the hyperbaric physician Administer a ten (10) minute air break for patient if signs and symptoms of seizure appear and notify the hyperbaric physician Administer decongestants,  per physician orders, prior to HBO2 Administer the correct therapeutic gas delivery based on the patients needs and limitations, per physician order Assess and provide for patientos comfort related to the hyperbaric environment and equalization of middle ear Assess for signs and symptoms related to adverse events, including but not limited to confinement anxiety, pneumothorax, oxygen toxicity and baurotrauma Assess patient for any history of confinement anxiety Assess patient's knowledge and expectations regarding hyperbaric medicine and provide education related to the hyperbaric environment, goals of treatment and prevention of adverse events Implement protocols to decrease risk of pneumothorax in high risk patients Notes: Orientation to the Wound Care Program Nursing Diagnoses: Knowledge deficit related to the wound healing center program Goals: Patient/caregiver will verbalize understanding of the [place] Program Date Initiated: 07/30/2015 Goal Status: Active Interventions: Provide education on orientation to the wound center Notes: Wound/Skin Impairment Nursing Diagnoses: Impaired tissue integrity Knowledge deficit related to smoking impact on wound healing Goals: Patient will have a decrease in wound volume by X% from date: (specify in notes) Date Initiated: 07/30/2015 Goal Status: Active Patient/caregiver will verbalize understanding of skin care regimen Angela Patterson, Angela Patterson ([id number]) Date Initiated: 07/30/2015 Goal Status: Active Ulcer/skin breakdown will have a volume reduction of 30% by week 4 Date Initiated: 07/30/2015 Goal Status: Active Ulcer/skin breakdown will have a volume reduction of 50% by week 8 Date Initiated: 07/30/2015 Goal Status: Active Ulcer/skin breakdown will have a volume reduction of 80% by week 12 Date Initiated: 07/30/2015 Goal Status: Active Ulcer/skin breakdown will heal within 14 weeks Date Initiated: 07/30/2015 Goal Status:  Active Interventions: Assess patient/caregiver ability to obtain necessary supplies Assess patient/caregiver ability to perform ulcer/skin care regimen upon admission and as needed Provide education on ulcer and skin care Notes: Electronic Signature(s) Signed: 09/09/2015 4:54:11 PM By: Rebecca Eaton RN, Roslynn Amble Signed: 09/10/2015 8:19:40 AM By: Alric Quan Entered By: Rebecca Eaton RN, Sendra on 09/09/2015 16:23:40 Angela Patterson ([id number]) -------------------------------------------------------------------------------- Pain Assessment Details Patient Name: Angela Patterson. Date of Service: 09/09/2015 3:30 PM Medical Record Number: [id number] Patient Account Number: [id number] Date of Birth/Sex: 02/26/1921 (82 y.o. Female) Treating RN: Carolyne Fiscal, Debi Primary Care Physician: Einar Pheasant Other Clinician: Referring Physician: Einar Pheasant Treating Physician/Extender: Frann Rider in Treatment: 5 Active Problems Location of Pain Severity and Description of Pain Patient Has Paino No Site Locations Pain Management and Medication Current Pain Management: Electronic Signature(s) Signed: 09/09/2015 4:54:11 PM By: Ardean Larsen Signed: 09/10/2015 8:19:40 AM By: Alric Quan Entered By: Rebecca Eaton RN, Sendra on 09/09/2015 16:23:15 Angela Patterson ([id number]) -------------------------------------------------------------------------------- Patient/Caregiver Education Details Patient Name: Angela Patterson. Date of Service: 09/09/2015 3:30 PM Medical Record Number: [id number] Patient Account Number: [id number] Date of Birth/Gender: 1921/03/26 (82 y.o. Female) Treating RN: Carolyne Fiscal, Debi Primary Care Physician: Einar Pheasant Other Clinician: Referring Physician: Einar Pheasant Treating Physician/Extender: Frann Rider in Treatment: 5 Education Assessment Education Provided To: Patient Education Topics Provided Wound/Skin Impairment: Handouts: Other: change  dressing as ordered Methods: Demonstration, Explain/Verbal Responses: State content correctly Electronic Signature(s) Signed: 09/09/2015 4:54:11 PM By: Rebecca Eaton, RN, Sendra Entered By: Rebecca Eaton, RN, Sendra on 09/09/2015 16:22:56 Angela Patterson. (  [id number]) -------------------------------------------------------------------------------- Wound Assessment Details Patient Name: Angela Patterson, Angela Patterson. Date of Service: 09/09/2015 3:30 PM Medical Record Number: [id number] Patient Account Number: [id number] Date of Birth/Sex: 1920/09/10 (82 y.o. Female) Treating RN: Macarthur Critchley Primary Care Physician: Einar Pheasant Other Clinician: Referring Physician: Einar Pheasant Treating Physician/Extender: Frann Rider in Treatment: 5 Wound Status Wound Number: 3 Primary Diabetic Wound/Ulcer of the Lower Etiology: Extremity Wound Location: Left Metatarsal head fifth - Plantar Wound Open Status: Wounding Event: Gradually Appeared Comorbid Cataracts, Glaucoma, Hypertension, Date Acquired: 07/01/2015 History: Type II Diabetes, History of pressure Weeks Of Treatment: 5 wounds, Neuropathy Clustered Wound: No Photos Photo Uploaded By: Alric Quan on 09/09/2015 17:29:23 Wound Measurements Length: (cm) 0.4 Width: (cm) 0.4 Depth: (cm) 0.3 Area: (cm) 0.126 Volume: (cm) 0.038 % Reduction in Area: 97.3% % Reduction in Volume: 96% Epithelialization: None Tunneling: No Undermining: Yes Starting Position (o'clock): 12 Ending Position (o'clock): 12 Maximum Distance: (cm) 1.4 Wound Description Classification: Grade 1 Wound Margin: Distinct, outline attached Exudate Amount: Large Exudate Type: Serosanguineous Exudate Color: red, brown Foul Odor After Cleansing: No Wound Bed Angela Patterson ([id number]) Granulation Amount: Large (67-100%) Exposed Structure Granulation Quality: Red, Pink Fascia Exposed: No Necrotic Amount: Small (1-33%) Fat Layer Exposed: No Necrotic Quality:  Adherent Slough Tendon Exposed: No Muscle Exposed: No Joint Exposed: No Bone Exposed: No Limited to Skin Breakdown Periwound Skin Texture Texture Color No Abnormalities Noted: No No Abnormalities Noted: No Callus: Yes Atrophie Blanche: No Crepitus: No Cyanosis: No Excoriation: No Ecchymosis: No Fluctuance: No Erythema: No Friable: No Hemosiderin Staining: No Induration: No Mottled: No Localized Edema: No Pallor: No Rash: No Rubor: No Scarring: No Temperature / Pain Moisture Temperature: No Abnormality No Abnormalities Noted: No Tenderness on Palpation: Yes Dry / Scaly: No Maceration: No Moist: Yes Wound Preparation Ulcer Cleansing: Rinsed/Irrigated with Saline Topical Anesthetic Applied: Other: lidocaine 4%, Treatment Notes Wound #3 (Left, Plantar Metatarsal head fifth) 1. Cleansed with: Clean wound with Normal Saline 2. Anesthetic Topical Lidocaine 4% cream to wound bed prior to debridement 3. Peri-wound Care: Skin Prep 4. Dressing Applied: Aquacel Ag 5. Secondary Dressing Applied Foam Gauze and Kerlix/Conform 6. Footwear/Offloading device applied Felt/Foam Angela Patterson, DRYE. ([id number]) 7. Secured with Paper tape Notes darco front Dietitian) Signed: 09/09/2015 4:54:11 PM By: Rebecca Eaton, RN, Sendra Entered By: Rebecca Eaton RN, Sendra on 09/09/2015 15:40:52 Angela Patterson ([id number]) -------------------------------------------------------------------------------- [place] Details Patient Name: Angela Patterson. Date of Service: 09/09/2015 3:30 PM Medical Record Number: [id number] Patient Account Number: [id number] Date of Birth/Sex: 03-13-1921 (82 y.o. Female) Treating RN: Carolyne Fiscal, Debi Primary Care Physician: Einar Pheasant Other Clinician: Referring Physician: Einar Pheasant Treating Physician/Extender: Frann Rider in Treatment: 5 Vital Signs Time Taken: 15:31 Temperature (F): 98.1 Height (in): 65 Pulse (bpm):  87 Weight (lbs): 107 Respiratory Rate (breaths/min): 20 Body Mass Index (BMI): 17.8 Blood Pressure (mmHg): 163/70 Reference Range: 80 - 120 mg / dl Electronic Signature(s) Signed: 09/09/2015 4:54:11 PM By: Rebecca Eaton RN, Sendra Entered By: Rebecca Eaton RN, Sendra on 09/09/2015 16:23:21

## 2015-09-10 NOTE — Progress Notes (Signed)
LAKELYN, FOCHTMAN ([id number]) Visit Report for 09/09/2015 Chief Complaint Document Details Patient Name: Angela Patterson, Angela Patterson. Date of Service: 09/09/2015 3:30 PM Medical Record Number: [id number] Patient Account Number: [id number] Date of Birth/Sex: [birth year] (82 y.o. Female) Treating RN: Carolyne Fiscal, Debi Primary Care Physician: Einar Pheasant Other Clinician: Referring Physician: Einar Pheasant Treating Physician/Extender: Frann Rider in Treatment: 5 Information Obtained from: Patient Chief Complaint Patient presents to the wound care center for a consult due non healing wound Pleasant 82 year old comes along with her daughter with a recurrent ulceration and callosity on her left foot. she has had this for about 3 weeks now. Electronic Signature(s) Signed: 09/09/2015 4:57:36 PM By: Christin Fudge MD, FACS Entered By: Christin Fudge on 09/09/2015 16:57:36 Angela Patterson ([id number]) -------------------------------------------------------------------------------- Debridement Details Patient Name: Angela Patterson. Date of Service: 09/09/2015 3:30 PM Medical Record Number: [id number] Patient Account Number: [id number] Date of Birth/Sex: July 03, 1921 (82 y.o. Female) Treating RN: Carolyne Fiscal, Debi Primary Care Physician: Einar Pheasant Other Clinician: Referring Physician: Einar Pheasant Treating Physician/Extender: Frann Rider in Treatment: 5 Debridement Performed for Wound #3 Left,Plantar Metatarsal head fifth Assessment: Performed By: Physician Christin Fudge, MD Debridement: Debridement Pre-procedure Yes Verification/Time Out Taken: Start Time: 15:49 Pain Control: Other : lidocaine 4% Level: Skin/Subcutaneous Tissue Total Area Debrided (L x 0.4 (cm) x 0.4 (cm) = 0.16 (cm) W): Tissue and other Viable, Non-Viable, Callus, Exudate, Fibrin/Slough, Subcutaneous material debrided: Instrument: Forceps, Scissors Bleeding: Large Hemostasis Achieved: Silver Nitrate End Time:  16:05 Procedural Pain: 0 Post Procedural Pain: 0 Response to Treatment: Procedure was tolerated well Post Debridement Measurements of Total Wound Length: (cm) 0.8 Width: (cm) 2 Depth: (cm) 0.2 Volume: (cm) 0.251 Post Procedure Diagnosis Same as Pre-procedure Electronic Signature(s) Signed: 09/09/2015 4:57:30 PM By: Christin Fudge MD, FACS Signed: 09/10/2015 8:19:40 AM By: Alric Quan Previous Signature: 09/09/2015 4:54:11 PM Version By: Rebecca Eaton RN, Sendra Entered By: Christin Fudge on 09/09/2015 16:57:30 Angela Patterson ([id number]) -------------------------------------------------------------------------------- HPI Details Patient Name: Angela Patterson. Date of Service: 09/09/2015 3:30 PM Medical Record Number: [id number] Patient Account Number: [id number] Date of Birth/Sex: [birth year] (82 y.o. Female) Treating RN: Carolyne Fiscal, Debi Primary Care Physician: Einar Pheasant Other Clinician: Referring Physician: Einar Pheasant Treating Physician/Extender: Frann Rider in Treatment: 5 History of Present Illness Location: left foot Quality: mild pain occasionally. Severity: the problem is been getting worse for the last 3 weeks Duration: she's had this for almost a year now on and off. Timing: pain is intermittent and occasional. Context: she was seen by me earlier in March and again in May and was healed by early June 2016 Modifying Factors: she has had diabetic shoes made and is compliant with wearing these. Associated Signs and Symptoms: worse on standing on her feet for too long. HPI Description: this very pleasant 82 year old patient who is extremely active and ambulating all day has had problems with the left foot for over a year. Her daughter who is her caregiver says she drives around and even goes around and does her chores by herself. this is the third recurrence and after the last time we saw in June and discharged her her daughter has got her to pace of diabetic  shoes and she's been wearing these regularly. She has been on the shoes for about 6 months now. Her diabetes is under control and her blood sugars run from a anywhere between 110 to 130 and she checks them twice a day. no fever or discharge. During her initial visit, an x-ray of her left foot  was done on 09/26/2014. There was no cortical erosion noted there was mild osteopenia in the distal first metatarsal. Radiologist has recommended a MRI of the left foot to exclude osteomyelitis. MRI done on 10/09/2014 reveals that the area where she had a phlegmon is without osteomyelitis. the bone marrow and other areas are compatible with reactive edema rather than osteomyelitis. 08/06/2015 -- x-ray of the left foot done on 07/30/2015 shows IMPRESSION:Soft tissue wound with a bandage is noted over the plantar aspect of the distal left foot. Diffuse osteopenia degenerative change. No acute bony abnormality Electronic Signature(s) Signed: 09/09/2015 4:57:44 PM By: Christin Fudge MD, FACS Entered By: Christin Fudge on 09/09/2015 16:57:44 Angela Patterson ([id number]) -------------------------------------------------------------------------------- Physical Exam Details Patient Name: Angela Patterson. Date of Service: 09/09/2015 3:30 PM Medical Record Number: [id number] Patient Account Number: [id number] Date of Birth/Sex: 17-Oct-1920 (82 y.o. Female) Treating RN: Carolyne Fiscal, Debi Primary Care Physician: Einar Pheasant Other Clinician: Referring Physician: Einar Pheasant Treating Physician/Extender: Frann Rider in Treatment: 5 Constitutional . Pulse regular. Respirations normal and unlabored. Afebrile. . Eyes Nonicteric. Reactive to light. Ears, Nose, Mouth, and Throat Lips, teeth, and gums WNL.Marland Kitchen Moist mucosa without lesions. Neck supple and nontender. No palpable supraclavicular or cervical adenopathy. Normal sized without goiter. Respiratory WNL. No retractions.. Cardiovascular Pedal Pulses  WNL. No clubbing, cyanosis or edema. Lymphatic No adneopathy. No adenopathy. No adenopathy. Musculoskeletal Adexa without tenderness or enlargement.. Digits and nails w/o clubbing, cyanosis, infection, petechiae, ischemia, or inflammatory conditions.. Integumentary (Hair, Skin) No suspicious lesions. No crepitus or fluctuance. No peri-wound warmth or erythema. No masses.Marland Kitchen Psychiatric Judgement and insight Intact.. No evidence of depression, anxiety, or agitation.. Notes with a forcep and scissors sharply removed a lot of subcutaneous tissue, callus and she had brisk bleeding which had to be controlled with Silver nitrate stick Electronic Signature(s) Signed: 09/09/2015 4:58:17 PM By: Christin Fudge MD, FACS Entered By: Christin Fudge on 09/09/2015 16:58:17 Angela Patterson ([id number]) -------------------------------------------------------------------------------- Physician Orders Details Patient Name: Angela Patterson. Date of Service: 09/09/2015 3:30 PM Medical Record Number: [id number] Patient Account Number: [id number] Date of Birth/Sex: June 30, 1921 (82 y.o. Female) Treating RN: Carolyne Fiscal, Debi Primary Care Physician: Einar Pheasant Other Clinician: Referring Physician: Einar Pheasant Treating Physician/Extender: Frann Rider in Treatment: 5 Verbal / Phone Orders: Yes Clinician: Carolyne Fiscal, Debi Read Back and Verified: Yes Diagnosis Coding Wound Cleansing Wound #3 Left,Plantar Metatarsal head fifth o Cleanse wound with mild soap and water o May Shower, gently pat wound dry prior to applying new dressing. Anesthetic Wound #3 Left,Plantar Metatarsal head fifth o Topical Lidocaine 4% cream applied to wound bed prior to debridement Primary Wound Dressing Wound #3 Left,Plantar Metatarsal head fifth o Aquacel Ag Secondary Dressing Wound #3 Left,Plantar Metatarsal head fifth o Gauze and Kerlix/Conform o Foam - felt Dressing Change Frequency Wound #3 Left,Plantar  Metatarsal head fifth o Change dressing every other day. Follow-up Appointments Wound #3 Left,Plantar Metatarsal head fifth o Return Appointment in 1 week. Off-Loading Wound #3 Left,Plantar Metatarsal head fifth o Open toe surgical shoe to: - front offloader darco o Other: - prescription for [facility] Walking Boot Additional Orders / Instructions Wound #3 Left,Plantar Metatarsal head fifth o Increase protein intake. o Activity as tolerated JAMILETH, FEULNER ([id number]) o Other: - Keep blood sugars under control Electronic Signature(s) Signed: 09/09/2015 4:54:11 PM By: Ardean Larsen Signed: 09/09/2015 5:00:32 PM By: Christin Fudge MD, FACS Entered By: Rebecca Eaton RN, Sendra on 09/09/2015 16:24:39 Angela Patterson ([id number]) -------------------------------------------------------------------------------- Problem List Details Patient Name: [name]  M. Date of Service: 09/09/2015 3:30 PM Medical Record Number: [id number] Patient Account Number: [id number] Date of Birth/Sex: 11-Oct-1920 (82 y.o. 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 Date of Service: 09/09/2015 3:30 PM Medical Record Number: [id number] Patient Account  Number: [id number] Date of Birth/Sex: 1920/09/30 (82 y.o. Female) Treating RN: Carolyne Fiscal, Debi Primary Care Physician: Einar Pheasant Other Clinician: Referring Physician: Einar Pheasant Treating Physician/Extender: Frann Rider in Treatment: 5 Subjective Chief Complaint Information obtained from Patient Patient presents to the wound care center for a consult due non healing wound Pleasant 82 year old comes along with her daughter with a recurrent ulceration and callosity on her left foot. she has had this for about 3 weeks now. History of Present Illness (HPI) The following HPI elements were documented for the patient's wound: Location: left foot Quality: mild pain occasionally. Severity: the problem is been getting worse for the last 3 weeks Duration: she's had this for almost a year now on and off. Timing: pain is intermittent and occasional. Context: she was seen by me earlier in March and again in May and was healed by early June 2016 Modifying Factors: she has had diabetic shoes made and is compliant with wearing these. Associated Signs and Symptoms: worse on standing on her feet for too long. this very pleasant 82 year old patient who is extremely active and ambulating all day has had problems with the left foot for over a year. Her daughter who is her caregiver says she drives around and even goes around and does her chores by herself. this is the third recurrence and after the last time we saw in June and discharged her her daughter has got her to pace of diabetic shoes and she's been wearing these regularly. She has been on the shoes for about 6 months now. Her diabetes is under control and her blood sugars run from a anywhere between 110 to 130 and she checks them twice a day. no fever or discharge. During her initial visit, an x-ray of her left foot was done on 09/26/2014. There was no cortical erosion noted there was mild osteopenia in the distal first  metatarsal. Radiologist has recommended a MRI of the left foot to exclude osteomyelitis. MRI done on 10/09/2014 reveals that the area where she had a phlegmon is without osteomyelitis. the bone marrow and other areas are compatible with reactive edema rather than osteomyelitis. 08/06/2015 -- x-ray of the left foot done on 07/30/2015 shows IMPRESSION:Soft tissue wound with a bandage is noted over the plantar aspect of the distal left foot. Diffuse osteopenia degenerative change. No acute bony abnormality VESSIE, SCHOENTHALER. ([id number]) Objective Constitutional Pulse regular. Respirations normal and unlabored. Afebrile. Vitals Time Taken: 3:31 PM, Height: 65 in, Weight: 107 lbs, BMI: 17.8, Temperature: 98.1 F, Pulse: 87 bpm, Respiratory Rate: 20 breaths/min, Blood Pressure: 163/70 mmHg. Eyes Nonicteric. Reactive to light. Ears, Nose, Mouth, and Throat Lips, teeth, and gums WNL.Marland Kitchen Moist mucosa without lesions. Neck supple and nontender. No  palpable supraclavicular or cervical adenopathy. Normal sized without goiter. Respiratory WNL. No retractions.. Cardiovascular Pedal Pulses WNL. No clubbing, cyanosis or edema. Lymphatic No adneopathy. No adenopathy. No adenopathy. Musculoskeletal Adexa without tenderness or enlargement.. Digits and nails w/o clubbing, cyanosis, infection, petechiae, ischemia, or inflammatory conditions.Marland Kitchen Psychiatric Judgement and insight Intact.. No evidence of depression, anxiety, or agitation.. General Notes: with a forcep and scissors sharply removed a lot of subcutaneous tissue, callus and she had brisk bleeding which had to be controlled with Silver nitrate stick Integumentary (Hair, Skin) No suspicious lesions. No crepitus or fluctuance. No peri-wound warmth or erythema. No masses.. Wound #3 status is Open. Original cause of wound was Gradually Appeared. The wound is located on the Left,Plantar Metatarsal head fifth. The wound measures 0.4cm length x 0.4cm width  x 0.3cm depth; 0.126cm^2 area and 0.038cm^3 volume. The wound is limited to skin breakdown. There is no tunneling noted, however, there is undermining starting at 12:00 and ending at 12:00 with a maximum distance of 1.4cm. There is a large amount of serosanguineous drainage noted. The wound margin is distinct with the outline attached to the wound base. There is large (67-100%) red, pink granulation within the wound bed. JOA, MADDUX ([id number]) There is a small (1-33%) amount of necrotic tissue within the wound bed including Adherent Slough. The periwound skin appearance exhibited: Callus, Moist. The periwound skin appearance did not exhibit: Crepitus, Excoriation, Fluctuance, Friable, Induration, Localized Edema, Rash, Scarring, Dry/Scaly, Maceration, Atrophie Blanche, Cyanosis, Ecchymosis, Hemosiderin Staining, Mottled, Pallor, Rubor, Erythema. Periwound temperature was noted as No Abnormality. The periwound has tenderness on palpation. Assessment Active Problems ICD-10 E11.621 - Type 2 diabetes mellitus with foot ulcer L97.522 - Non-pressure chronic ulcer of other part of left foot with fat layer exposed L84 - Corns and callosities Procedures Wound #3 Wound #3 is a Diabetic Wound/Ulcer of the Lower Extremity located on the Left,Plantar Metatarsal head fifth . There was a Skin/Subcutaneous Tissue Debridement [id number]) debridement with total area of 0.16 sq cm performed by Christin Fudge, MD. with the following instrument(s): Forceps and Scissors to remove Viable and Non-Viable tissue/material including Exudate, Fibrin/Slough, Callus, and Subcutaneous after achieving pain control using Other (lidocaine 4%). A time out was conducted prior to the start of the procedure. A Large amount of bleeding was controlled with Silver Nitrate. The procedure was tolerated well with a pain level of 0 throughout and a pain level of 0 following the procedure. Post Debridement Measurements: 0.8cm  length x 2cm width x 0.2cm depth; 0.251cm^3 volume. Post procedure Diagnosis Wound #3: Same as Pre-Procedure Plan Wound Cleansing: Wound #3 Left,Plantar Metatarsal head fifth: Cleanse wound with mild soap and water May Shower, gently pat wound dry prior to applying new dressing. Anesthetic: SAPHYRA, BOST ([id number]) Wound #3 Left,Plantar Metatarsal head fifth: Topical Lidocaine 4% cream applied to wound bed prior to debridement Primary Wound Dressing: Wound #3 Left,Plantar Metatarsal head fifth: Aquacel Ag Secondary Dressing: Wound #3 Left,Plantar Metatarsal head fifth: Gauze and Kerlix/Conform Foam - felt Dressing Change Frequency: Wound #3 Left,Plantar Metatarsal head fifth: Change dressing every other day. Follow-up Appointments: Wound #3 Left,Plantar Metatarsal head fifth: Return Appointment in 1 week. Off-Loading: Wound #3 Left,Plantar Metatarsal head fifth: Open toe surgical shoe to: - front offloader darco Other: - prescription for [facility] Walking Boot Additional Orders / Instructions: Wound #3 Left,Plantar Metatarsal head fifth: Increase protein intake. Activity as tolerated Other: - Keep blood sugars under control I have recommended silver alginate with a offloading felt ring around it. I  have ordered her a DH walking offloading boot and her son was at the bedside understands the treatment plan. Until then she will also use a Darco front off loading shoe. This time around she has not done so well and the fact remains that she is unable to tolerate a total contact cast as she is very unsteady and her feet. She will come back to see me every week. Electronic Signature(s) Signed: 09/09/2015 4:59:56 PM By: Christin Fudge MD, FACS Entered By: Christin Fudge on 09/09/2015 16:59:56 Angela Patterson ([id number]) -------------------------------------------------------------------------------- SuperBill Details Patient Name: Angela Patterson. Date of Service: 09/09/2015 Medical  Record Number: [id number] Patient Account Number: [id number] Date of Birth/Sex: 09/15/1920 (82 y.o. Female) Treating RN: Carolyne Fiscal, Debi Primary Care Physician: Einar Pheasant Other Clinician: Referring Physician: Einar Pheasant Treating Physician/Extender: Frann Rider in Treatment: 5 Diagnosis Coding ICD-10 Codes Code Description E11.621 Type 2 diabetes mellitus with 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 with fat layer exposed L84 Corns and callosities Facility Procedures CPT4 Code Description: [id number] - DEB SUBQ TISSUE 20 SQ CM/< ICD-10 Description Diagnosis E11.621 Type 2 diabetes mellitus with 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 L84 Corns and callosities Modifier: with fat lay Quantity: 1 er exposed Physician Procedures CPT4 Code Description: [id number] - WC PHYS SUBQ TISS 20 SQ CM ICD-10 Description Diagnosis E11.621 Type 2 diabetes mellitus with 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 L84 Corns and callosities Modifier: with fat laye Quantity: 1 r exposed Electronic Signature(s) Signed: 09/09/2015 5:00:06 PM By: Christin Fudge MD, FACS Entered By: Christin Fudge on 09/09/2015 17:00:05

## 2015-09-16 ENCOUNTER — Encounter: Payer: Medicare Other | Admitting: Surgery

## 2015-09-16 DIAGNOSIS — I10 Essential (primary) hypertension: Secondary | ICD-10-CM | POA: Diagnosis not present

## 2015-09-16 DIAGNOSIS — E11621 Type 2 diabetes mellitus with foot ulcer: Secondary | ICD-10-CM | POA: Diagnosis not present

## 2015-09-16 DIAGNOSIS — L97522 Non-pressure chronic ulcer of other part of left foot with fat layer exposed: Secondary | ICD-10-CM | POA: Diagnosis not present

## 2015-09-16 DIAGNOSIS — L97521 Non-pressure chronic ulcer of other part of left foot limited to breakdown of skin: Secondary | ICD-10-CM | POA: Diagnosis not present

## 2015-09-16 DIAGNOSIS — L84 Corns and callosities: Secondary | ICD-10-CM | POA: Diagnosis not present

## 2015-09-16 DIAGNOSIS — E1140 Type 2 diabetes mellitus with diabetic neuropathy, unspecified: Secondary | ICD-10-CM | POA: Diagnosis not present

## 2015-09-17 NOTE — Progress Notes (Signed)
Angela, Patterson ([id number]) Visit Report for 09/16/2015 Chief Complaint Document Details Patient Name: Angela Patterson, Angela Patterson. Date of Service: 09/16/2015 2:00 PM Medical Record Number: [id number] Patient Account Number: [id number] Date of Birth/Sex: April 15, 1921 (82 y.o. Female) Treating RN: Carolyne Fiscal, Debi Primary Care Physician: Einar Pheasant Other Clinician: Referring Physician: Einar Pheasant Treating Physician/Extender: Frann Rider in Treatment: 6 Information Obtained from: Patient Chief Complaint Patient presents to the wound care center for a consult due non healing wound Pleasant 82 year old comes along with her daughter with a recurrent ulceration and callosity on her left foot. she has had this for about 3 weeks now. Electronic Signature(s) Signed: 09/16/2015 3:01:55 PM By: Christin Fudge MD, FACS Entered By: Christin Fudge on 09/16/2015 15:01:55 Angela Patterson ([id number]) -------------------------------------------------------------------------------- Debridement Details Patient Name: Angela Patterson. Date of Service: 09/16/2015 2:00 PM Medical Record Number: [id number] Patient Account Number: [id number] Date of Birth/Sex: December 29, 1920 (82 y.o. Female) Treating RN: Carolyne Fiscal, Debi Primary Care Physician: Einar Pheasant Other Clinician: Referring Physician: Einar Pheasant Treating Physician/Extender: Frann Rider in Treatment: 6 Debridement Performed for Wound #3 Left,Plantar Metatarsal head fifth Assessment: Performed By: Physician Christin Fudge, MD Debridement: Debridement Pre-procedure Yes Verification/Time Out Taken: Start Time: 14:45 Pain Control: Other : lidocaine 4% cream Level: Skin/Subcutaneous Tissue Total Area Debrided (L x 0.7 (cm) x 0.9 (cm) = 0.63 (cm) W): Tissue and other Viable, Non-Viable, Exudate, Fibrin/Slough, Subcutaneous material debrided: Instrument: Forceps Bleeding: Minimum Hemostasis Achieved: Pressure End Time:  14:48 Procedural Pain: 0 Post Procedural Pain: 0 Response to Treatment: Procedure was tolerated well Post Debridement Measurements of Total Wound Length: (cm) 0.7 Width: (cm) 0.9 Depth: (cm) 0.2 Volume: (cm) 0.099 Post Procedure Diagnosis Same as Pre-procedure Electronic Signature(s) Signed: 09/16/2015 3:01:48 PM By: Christin Fudge MD, FACS Signed: 09/16/2015 4:52:05 PM By: Alric Quan Entered By: Christin Fudge on 09/16/2015 15:01:48 Angela Patterson ([id number]) -------------------------------------------------------------------------------- HPI Details Patient Name: Angela Patterson. Date of Service: 09/16/2015 2:00 PM Medical Record Number: [id number] Patient Account Number: [id number] Date of Birth/Sex: [birth year] (82 y.o. Female) Treating RN: Carolyne Fiscal, Debi Primary Care Physician: Einar Pheasant Other Clinician: Referring Physician: Einar Pheasant Treating Physician/Extender: Frann Rider in Treatment: 6 History of Present Illness Location: left foot Quality: mild pain occasionally. Severity: the problem is been getting worse for the last 3 weeks Duration: she's had this for almost a year now on and off. Timing: pain is intermittent and occasional. Context: she was seen by me earlier in March and again in May and was healed by early June 2016 Modifying Factors: she has had diabetic shoes made and is compliant with wearing these. Associated Signs and Symptoms: worse on standing on her feet for too long. HPI Description: this very pleasant 82 year old patient who is extremely active and ambulating all day has had problems with the left foot for over a year. Her daughter who is her caregiver says she drives around and even goes around and does her chores by herself. this is the third recurrence and after the last time we saw in June and discharged her her daughter has got her to pace of diabetic shoes and she's been wearing these regularly. She has been on the shoes  for about 6 months now. Her diabetes is under control and her blood sugars run from a anywhere between 110 to 130 and she checks them twice a day. no fever or discharge. During her initial visit, an x-ray of her left foot was done on 09/26/2014. There was no cortical erosion noted there was  mild osteopenia in the distal first metatarsal. Radiologist has recommended a MRI of the left foot to exclude osteomyelitis. MRI done on 10/09/2014 reveals that the area where she had a phlegmon is without osteomyelitis. the bone marrow and other areas are compatible with reactive edema rather than osteomyelitis. 08/06/2015 -- x-ray of the left foot done on 07/30/2015 shows IMPRESSION:Soft tissue wound with a bandage is noted over the plantar aspect of the distal left foot. Diffuse osteopenia degenerative change. No acute bony abnormality 09/16/2015 -- last week she had gone [facility] and met with Harrington Challenger who had spoken to me over the phone. Instead of a DH walking boot he has given her an offloading boot which will make her more steady and this boot is called DH healing sandal with a custom-made insole to offload this area. Electronic Signature(s) Signed: 09/16/2015 3:11:33 PM By: Christin Fudge MD, FACS Previous Signature: 09/16/2015 3:04:36 PM Version By: Christin Fudge MD, FACS Previous Signature: 09/16/2015 3:04:28 PM Version By: Christin Fudge MD, FACS Entered By: Christin Fudge on 09/16/2015 15:11:33 Angela Patterson ([name] ([id number]) -------------------------------------------------------------------------------- Physical Exam Details Patient Name: Angela Patterson, Angela Patterson. Date of Service: 09/16/2015 2:00 PM Medical Record Number: [id number] Patient Account Number: [id number] Date of Birth/Sex: 1920-11-05 (82 y.o. Female) Treating RN: Carolyne Fiscal, Debi Primary Care Physician: Einar Pheasant Other Clinician: Referring Physician: Einar Pheasant Treating  Physician/Extender: Frann Rider in Treatment: 6 Constitutional . Pulse regular. Respirations normal and unlabored. Afebrile. . Eyes Nonicteric. Reactive to light. Ears, Nose, Mouth, and Throat Lips, teeth, and gums WNL.Marland Kitchen Moist mucosa without lesions. Neck supple and nontender. No palpable supraclavicular or cervical adenopathy. Normal sized without goiter. Respiratory WNL. No retractions.. Cardiovascular Pedal Pulses WNL. No clubbing, cyanosis or edema. Lymphatic No adneopathy. No adenopathy. No adenopathy. Musculoskeletal Adexa without tenderness or enlargement.. Digits and nails w/o clubbing, cyanosis, infection, petechiae, ischemia, or inflammatory conditions.. Integumentary (Hair, Skin) No suspicious lesions. No crepitus or fluctuance. No peri-wound warmth or erythema. No masses.Marland Kitchen Psychiatric Judgement and insight Intact.. No evidence of depression, anxiety, or agitation.. Notes with the forcep I was able to debride all the subcutaneous debris and down to healthy granulation tissue. There is no undermining and the minimal callus is supple Electronic Signature(s) Signed: 09/16/2015 3:05:11 PM By: Christin Fudge MD, FACS Entered By: Christin Fudge on 09/16/2015 15:05:11 Angela Patterson ([id number]) -------------------------------------------------------------------------------- Physician Orders Details Patient Name: Angela Patterson. Date of Service: 09/16/2015 2:00 PM Medical Record Number: [id number] Patient Account Number: [id number] Date of Birth/Sex: 1921/05/17 (82 y.o. Female) Treating RN: Carolyne Fiscal, Debi Primary Care Physician: Einar Pheasant Other Clinician: Referring Physician: Einar Pheasant Treating Physician/Extender: Frann Rider in Treatment: 6 Verbal / Phone Orders: Yes Clinician: Carolyne Fiscal, Debi Read Back and Verified: Yes Diagnosis Coding Wound Cleansing Wound #3 Left,Plantar Metatarsal head fifth o Cleanse wound with mild soap and  water o May Shower, gently pat wound dry prior to applying new dressing. Anesthetic Wound #3 Left,Plantar Metatarsal head fifth o Topical Lidocaine 4% cream applied to wound bed prior to debridement Primary Wound Dressing Wound #3 Left,Plantar Metatarsal head fifth o Aquacel Ag Secondary Dressing Wound #3 Left,Plantar Metatarsal head fifth o Gauze and Kerlix/Conform o Foam - felt Dressing Change Frequency Wound #3 Left,Plantar Metatarsal head fifth o Change dressing every other day. Follow-up Appointments Wound #3 Left,Plantar Metatarsal head fifth o Return Appointment in 1 week. Off-Loading Wound #3 Left,Plantar Metatarsal head fifth o Other: - special shoe from [employer] Additional Orders / Instructions  Wound #3 Left,Plantar Metatarsal head fifth o Increase protein intake. o Activity as tolerated o Other: - Keep blood sugars under control Angela Patterson, Angela Patterson ([id number]) Electronic Signature(s) Signed: 09/16/2015 4:45:18 PM By: Christin Fudge MD, FACS Signed: 09/16/2015 4:52:05 PM By: Alric Quan Entered By: Alric Quan on 09/16/2015 14:50:39 Angela Patterson ([id number]) -------------------------------------------------------------------------------- Problem List Details Patient Name: Angela Patterson. Date of Service: 09/16/2015 2:00 PM Medical Record Number: [id number] Patient Account Number: [id number] Date of Birth/Sex: March 11, 1921 (82 y.o. Female) Treating RN: Carolyne Fiscal, Debi Primary Care Physician: Einar Pheasant Other Clinician: Referring Physician: Einar Pheasant Treating Physician/Extender: Frann Rider in Treatment: 6 Active Problems ICD-10 Encounter Code Description Active Date Diagnosis E11.621 Type 2 diabetes mellitus with foot ulcer 07/30/2015 Yes L97.522 Non-pressure chronic ulcer of other part of left foot with fat 07/30/2015 Yes layer exposed L84 Corns and callosities 07/30/2015 Yes Inactive Problems Resolved  Problems Electronic Signature(s) Signed: 09/16/2015 3:01:36 PM By: Christin Fudge MD, FACS Entered By: Christin Fudge on 09/16/2015 15:01:35 Angela Patterson ([id number]) -------------------------------------------------------------------------------- Progress Note Details Patient Name: Angela Patterson. Date of Service: 09/16/2015 2:00 PM Medical Record Number: [id number] Patient Account Number: [id number] Date of Birth/Sex: [birth year] (82 y.o. Female) Treating RN: Carolyne Fiscal, Debi Primary Care Physician: Einar Pheasant Other Clinician: Referring Physician: Einar Pheasant Treating Physician/Extender: Frann Rider in Treatment: 6 Subjective Chief Complaint Information obtained from Patient Patient presents to the wound care center for a consult due non healing wound Pleasant 82 year old comes along with her daughter with a recurrent ulceration and callosity on her left foot. she has had this for about 3 weeks now. History of Present Illness (HPI) The following HPI elements were documented for the patient's wound: Location: left foot Quality: mild pain occasionally. Severity: the problem is been getting worse for the last 3 weeks Duration: she's had this for almost a year now on and off. Timing: pain is intermittent and occasional. Context: she was seen by me earlier in March and again in May and was healed by early June 2016 Modifying Factors: she has had diabetic shoes made and is compliant with wearing these. Associated Signs and Symptoms: worse on standing on her feet for too long. this very pleasant 82 year old patient who is extremely active and ambulating all day has had problems with the left foot for over a year. Her daughter who is her caregiver says she drives around and even goes around and does her chores by herself. this is the third recurrence and after the last time we saw in June and discharged her her daughter has got her to pace of diabetic shoes and she's been  wearing these regularly. She has been on the shoes for about 6 months now. Her diabetes is under control and her blood sugars run from a anywhere between 110 to 130 and she checks them twice a day. no fever or discharge. During her initial visit, an x-ray of her left foot was done on 09/26/2014. There was no cortical erosion noted there was mild osteopenia in the distal first metatarsal. Radiologist has recommended a MRI of the left foot to exclude osteomyelitis. MRI done on 10/09/2014 reveals that the area where she had a phlegmon is without osteomyelitis. the bone marrow and other areas are compatible with reactive edema rather than osteomyelitis. 08/06/2015 -- x-ray of the left foot done on 07/30/2015 shows IMPRESSION:Soft tissue wound with a bandage is noted over the plantar aspect of the distal left foot. Diffuse osteopenia degenerative change. No acute bony abnormality 09/16/2015 --  last week she had gone [facility] and met with Harrington Challenger who had spoken to me over the phone. Angela Patterson, Angela Patterson ([id number]) Instead of a DH walking boot he has given her an offloading boot which will make her more steady and this boot is called DH healing sandal with a custom-made insole to offload this area. Objective Constitutional Pulse regular. Respirations normal and unlabored. Afebrile. Vitals Time Taken: 2:07 PM, Height: 65 in, Weight: 107 lbs, BMI: 17.8, Temperature: 98.4 F, Pulse: 65 bpm, Respiratory Rate: 20 breaths/min, Blood Pressure: 124/69 mmHg. Eyes Nonicteric. Reactive to light. Ears, Nose, Mouth, and Throat Lips, teeth, and gums WNL.Marland Kitchen Moist mucosa without lesions. Neck supple and nontender. No palpable supraclavicular or cervical adenopathy. Normal sized without goiter. Respiratory WNL. No retractions.. Cardiovascular Pedal Pulses WNL. No clubbing, cyanosis or edema. Lymphatic No adneopathy. No adenopathy. No adenopathy. Musculoskeletal Adexa  without tenderness or enlargement.. Digits and nails w/o clubbing, cyanosis, infection, petechiae, ischemia, or inflammatory conditions.Marland Kitchen Psychiatric Judgement and insight Intact.. No evidence of depression, anxiety, or agitation.. General Notes: with the forcep I was able to debride all the subcutaneous debris and down to healthy granulation tissue. There is no undermining and the minimal callus is supple Integumentary (Hair, Skin) No suspicious lesions. No crepitus or fluctuance. No peri-wound warmth or erythema. No masses.. Wound #3 status is Open. Original cause of wound was Gradually Appeared. The wound is located on the Angela Patterson, Angela Patterson. ([id number]) Left,Plantar Metatarsal head fifth. The wound measures 0.7cm length x 0.9cm width x 0.1cm depth; 0.495cm^2 area and 0.049cm^3 volume. The wound is limited to skin breakdown. There is no tunneling noted, however, there is undermining starting at 12:00 and ending at 3:00 with a maximum distance of 0.7cm. There is a large amount of serosanguineous drainage noted. The wound margin is distinct with the outline attached to the wound base. There is large (67-100%) red, pink granulation within the wound bed. There is a small (1-33%) amount of necrotic tissue within the wound bed including Eschar and Adherent Slough. The periwound skin appearance exhibited: Callus, Moist. The periwound skin appearance did not exhibit: Crepitus, Excoriation, Fluctuance, Friable, Induration, Localized Edema, Rash, Scarring, Dry/Scaly, Maceration, Atrophie Blanche, Cyanosis, Ecchymosis, Hemosiderin Staining, Mottled, Pallor, Rubor, Erythema. Periwound temperature was noted as No Abnormality. The periwound has tenderness on palpation. Assessment Active Problems ICD-10 E11.621 - Type 2 diabetes mellitus with foot ulcer L97.522 - Non-pressure chronic ulcer of other part of left foot with fat layer exposed L84 - Corns and callosities The patient's son from [place] is down and I have had a detailed discussion with him regarding the offloading technique with the new shoe obtained from [employer]. I have also discussed the dressing changes and I have discussed offloading in great detail. They will continue with Aquacel Ag a felt pad and dressing to be changed every other day. She will see me back next week. Procedures Wound #3 Wound #3 is a Diabetic Wound/Ulcer of the Lower Extremity located on the Left,Plantar Metatarsal head fifth . There was a Skin/Subcutaneous Tissue Debridement ([id number]) debridement with total area of 0.63 sq cm performed by Christin Fudge, MD. with the following instrument(s): Forceps to remove Viable and Non-Viable tissue/material including Exudate, Fibrin/Slough, and Subcutaneous after achieving pain control using Other (lidocaine 4% cream). A time out was conducted prior to the start of the procedure. A Minimum amount of bleeding was controlled with Pressure. The procedure was tolerated well with a pain level of 0  throughout and a pain level of 0 following the procedure. Post Debridement Measurements: 0.7cm length x 0.9cm width x 0.2cm depth; 0.099cm^3 volume. Post procedure Diagnosis Wound #3: Same as Pre-Procedure Angela Patterson, Angela Patterson ([id number]) Plan Wound Cleansing: Wound #3 Left,Plantar Metatarsal head fifth: Cleanse wound with mild soap and water May Shower, gently pat wound dry prior to applying new dressing. Anesthetic: Wound #3 Left,Plantar Metatarsal head fifth: Topical Lidocaine 4% cream applied to wound bed prior to debridement Primary Wound Dressing: Wound #3 Left,Plantar Metatarsal head fifth: Aquacel Ag Secondary Dressing: Wound #3 Left,Plantar Metatarsal head fifth: Gauze and Kerlix/Conform Foam - felt Dressing Change Frequency: Wound #3 Left,Plantar Metatarsal head fifth: Change dressing every other day. Follow-up Appointments: Wound #3 Left,Plantar Metatarsal head fifth: Return  Appointment in 1 week. Off-Loading: Wound #3 Left,Plantar Metatarsal head fifth: Other: - special shoe from [employer] Additional Orders / Instructions: Wound #3 Left,Plantar Metatarsal head fifth: Increase protein intake. Activity as tolerated Other: - Keep blood sugars under control The patient's son from [place] is down and I have had a detailed discussion with him regarding the offloading technique with the new shoe obtained from [employer]. I have also discussed the dressing changes and I have discussed offloading in great detail. They will continue with Aquacel Ag a felt pad and dressing to be changed every other day. She will see me back next week. Electronic Signature(s) Signed: 09/16/2015 3:11:47 PM By: Christin Fudge MD, FACS Previous Signature: 09/16/2015 3:10:48 PM Version By: Christin Fudge MD, FACS Angela Patterson ([id number]) Entered By: Christin Fudge on 09/16/2015 15:11:46 Angela Patterson ([id number]) -------------------------------------------------------------------------------- SuperBill Details Patient Name: Angela Patterson. Date of Service: 09/16/2015 Medical Record Number: [id number] Patient Account Number: [id number] Date of Birth/Sex: [birth year] (82 y.o. Female) Treating RN: Carolyne Fiscal, Debi Primary Care Physician: Einar Pheasant Other Clinician: Referring Physician: Einar Pheasant Treating Physician/Extender: Frann Rider in Treatment: 6 Diagnosis Coding ICD-10 Codes Code Description E11.621 Type 2 diabetes mellitus with 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 with fat layer exposed L84 Corns and callosities Facility Procedures CPT4 Code Description: [id number] - DEB SUBQ TISSUE 20 SQ CM/< ICD-10 Description Diagnosis E11.621 Type 2 diabetes mellitus with 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 L84 Corns and callosities Modifier: with fat lay Quantity: 1 er  exposed Physician Procedures CPT4 Code Description: [id number] - WC PHYS SUBQ TISS 20 SQ CM ICD-10 Description Diagnosis E11.621 Type 2 diabetes mellitus with 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 L84 Corns and callosities Modifier: with fat laye Quantity: 1 r exposed Electronic Signature(s) Signed: 09/16/2015 3:11:59 PM By: Christin Fudge MD, FACS Entered By: Christin Fudge on 09/16/2015 15:11:58

## 2015-09-17 NOTE — Progress Notes (Signed)
Angela Patterson, Angela Patterson ([id number]) Visit Report for 09/16/2015 Arrival Information Details Patient Name: Angela Patterson, Angela Patterson. Date of Service: 09/16/2015 2:00 PM Medical Record Number: [id number] Patient Account Number: [id number] Date of Birth/Sex: 03/15/1921 (82 y.o. Female) Treating RN: Carolyne Fiscal, Debi Primary Care Physician: Einar Pheasant Other Clinician: Referring Physician: Einar Pheasant Treating Physician/Extender: Frann Rider in Treatment: 6 Visit Information History Since Last Visit All ordered tests and consults were completed: No Patient Arrived: Ambulatory Added or deleted any medications: No Arrival Time: 14:06 Any new allergies or adverse reactions: No Accompanied By: son Had a fall or experienced change in No Transfer Assistance: None activities of daily living that may affect Patient Identification Verified: Yes risk of falls: Secondary Verification Process Yes Signs or symptoms of abuse/neglect since last No Completed: visito Patient Requires Transmission-Based No Hospitalized since last visit: No Precautions: Pain Present Now: No Patient Has Alerts: No Electronic Signature(s) Signed: 09/16/2015 4:52:05 PM By: Alric Quan Entered By: Alric Quan on 09/16/2015 14:07:18 Angela Patterson ([id number]) -------------------------------------------------------------------------------- Encounter Discharge Information Details Patient Name: Angela Patterson. Date of Service: 09/16/2015 2:00 PM Medical Record Number: [id number] Patient Account Number: [id number] Date of Birth/Sex: 02/14/1921 (82 y.o. Female) Treating RN: Carolyne Fiscal, Debi Primary Care Physician: Einar Pheasant Other Clinician: Referring Physician: Einar Pheasant Treating Physician/Extender: Frann Rider in Treatment: 6 Encounter Discharge Information Items Discharge Pain Level: 0 Discharge Condition: Stable Ambulatory Status: Ambulatory Discharge Destination: Home Transportation:  Private Auto Accompanied By: son Schedule Follow-up Appointment: Yes Medication Reconciliation completed and provided to Patient/Care Yes Tasnim Balentine: Provided on Clinical Summary of Care: 09/16/2015 Form Type Recipient Paper Patient EG Electronic Signature(s) Signed: 09/16/2015 3:08:19 PM By: Ruthine Dose Entered By: Ruthine Dose on 09/16/2015 15:08:19 Angela Patterson ([id number]) -------------------------------------------------------------------------------- Lower Extremity Assessment Details Patient Name: Angela Patterson. Date of Service: 09/16/2015 2:00 PM Medical Record Number: [id number] Patient Account Number: [id number] Date of Birth/Sex: Aug 25, 1920 (82 y.o. Female) Treating RN: Carolyne Fiscal, Debi Primary Care Physician: Einar Pheasant Other Clinician: Referring Physician: Einar Pheasant Treating Physician/Extender: Frann Rider in Treatment: 6 Vascular Assessment Pulses: Posterior Tibial Dorsalis Pedis Palpable: [Left:Yes] Extremity colors, hair growth, and conditions: Extremity Color: [Left:Normal] Temperature of Extremity: [Left:Warm] Capillary Refill: [Left:< 3 seconds] Toe Nail Assessment Left: Right: Thick: No Discolored: No Deformed: No Improper Length and Hygiene: No Electronic Signature(s) Signed: 09/16/2015 4:52:05 PM By: Alric Quan Entered By: Alric Quan on 09/16/2015 14:10:54 Angela Patterson ([id number]) -------------------------------------------------------------------------------- Multi Wound Chart Details Patient Name: Angela Patterson. Date of Service: 09/16/2015 2:00 PM Medical Record Number: [id number] Patient Account Number: [id number] Date of Birth/Sex: 12/02/1920 (82 y.o. 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] Comorbid History: [3:Cataracts, Glaucoma, Hypertension, Type II Diabetes, History of pressure wounds, Neuropathy] [N/A:N/A] Date Acquired: [3:07/01/2015] [N/A:N/A] Weeks of Treatment: [3:6] [N/A:N/A] Wound Status: [3:Open] [N/A:N/A] Measurements L x W x D 0.7x0.9x0.1 [N/A:N/A] (cm) Area (cm) : [3:0.495] [N/A:N/A] Volume (cm) : [3:0.049] [N/A:N/A] % Reduction in Area: [3:89.50%] [N/A:N/A] % Reduction in Volume: 94.80% [N/A:N/A] Starting Position 1 12 (o'clock): Ending Position 1 [3:3] (o'clock): Maximum Distance 1 0.7 (cm): Undermining: [3:Yes] [N/A:N/A] Classification: [3:Grade 1] [N/A:N/A] Exudate Amount: [  3:Large] [N/A:N/A] Exudate Type: [3:Serosanguineous] [N/A:N/A] Exudate Color: red, brown N/A N/A Wound Margin: Distinct, outline attached N/A N/A Granulation Amount: Large (67-100%) N/A N/A Granulation Quality: Red, Pink N/A N/A Necrotic Amount: Small (1-33%) N/A N/A Necrotic Tissue: Eschar, Adherent Slough N/A N/A Exposed Structures: Fascia: No N/A N/A Fat: No Tendon: No Muscle: No Joint: No Bone: No Limited to Skin Breakdown Epithelialization: None N/A N/A Periwound Skin Texture: Callus: Yes N/A N/A Edema: No Excoriation: No Induration: No Crepitus: No Fluctuance: No Friable: No Rash: No Scarring: No Periwound Skin Moist: Yes N/A N/A Moisture: Maceration: No Dry/Scaly: No Periwound Skin Color: Atrophie Blanche: No N/A N/A Cyanosis: No Ecchymosis: No Erythema: No Hemosiderin Staining: No Mottled: No Pallor: No Rubor: No Temperature: No Abnormality N/A N/A Tenderness on Yes N/A N/A Palpation: Wound Preparation: Ulcer  Cleansing: N/A N/A Rinsed/Irrigated with Saline Topical Anesthetic Applied: Other: lidocaine 4% Treatment Notes Electronic Signature(s) Angela Patterson, Angela Patterson ([id number]) Signed: 09/16/2015 4:52:05 PM By: Alric Quan Entered By: Alric Quan on 09/16/2015 14:19:01 Angela Patterson ([id number]) -------------------------------------------------------------------------------- [place] Details Patient Name: Angela Patterson. Date of Service: 09/16/2015 2:00 PM Medical Record Number: [id number] Patient Account Number: [id number] Date of Birth/Sex: 1920/07/29 (82 y.o. 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 Anxiety related to knowledge deficit of hyperbaric oxygen therapy and treatment procedures Discomfort related to temperature and humidity changes inside hyperbaric chamber Potential for barotraumas to ears, sinuses, teeth, and lungs or cerebral gas embolism related to changes in atmospheric pressure inside hyperbaric oxygen chamber Potential for oxygen toxicity seizures related to delivery of 100% oxygen at an increased atmospheric pressure Potential for pulmonary oxygen toxicity related to delivery of 100% oxygen at an increased atmospheric pressure Goals: Barotrauma will be prevented during HBO2 Date Initiated: 07/30/2015 Goal Status: Active Patient and/or family will be able to state/discuss factors appropriate to the management of their disease process during treatment Date Initiated: 07/30/2015 Goal Status: Active Patient will tolerate the hyperbaric oxygen therapy treatment Date Initiated: 07/30/2015 Goal Status: Active Patient will tolerate the internal climate of the  chamber Date Initiated: 07/30/2015 Goal Status: Active Patient/caregiver will verbalize understanding of HBO goals, rationale, procedures and potential hazards Date Initiated: 07/30/2015 Goal Status: Active Signs and symptoms of pulmonary oxygen toxicity will be recognized and promptly addressed Date Initiated: 07/30/2015 Goal Status: Active Signs and symptoms of seizure will be recognized and promptly addressed ; seizing patients will suffer no harm Date Initiated: 07/30/2015 Angela Patterson ([id number]) Goal Status: Active Interventions: Administer a five (5) minute air break for patient if signs and symptoms of seizure appear and notify the hyperbaric physician Administer a ten (10) minute air break for patient if signs and symptoms of seizure appear and notify the hyperbaric physician Administer decongestants, per physician orders, prior to HBO2 Administer the correct therapeutic gas delivery based on the patients needs and limitations, per physician order Assess and provide for patientos comfort related to the hyperbaric environment and equalization of middle ear Assess for signs and symptoms related to adverse events, including but not limited to confinement anxiety, pneumothorax, oxygen toxicity and baurotrauma Assess patient for any history of confinement anxiety Assess patient's knowledge and expectations regarding hyperbaric medicine and provide education related to the hyperbaric environment, goals of treatment and prevention of adverse events Implement protocols to decrease risk  of pneumothorax in high risk patients Notes: Orientation to the Wound Care Program Nursing Diagnoses: Knowledge deficit related to the wound healing center program Goals: Patient/caregiver will verbalize understanding of the [place] Program Date Initiated: 07/30/2015 Goal Status: Active Interventions: Provide education on orientation to the wound center Notes: Wound/Skin  Impairment Nursing Diagnoses: Impaired tissue integrity Knowledge deficit related to smoking impact on wound healing Goals: Patient will have a decrease in wound volume by X% from date: (specify in notes) Date Initiated: 07/30/2015 Goal Status: Active Patient/caregiver will verbalize understanding of skin care regimen Angela Patterson, Angela Patterson ([id number]) Date Initiated: 07/30/2015 Goal Status: Active Ulcer/skin breakdown will have a volume reduction of 30% by week 4 Date Initiated: 07/30/2015 Goal Status: Active Ulcer/skin breakdown will have a volume reduction of 50% by week 8 Date Initiated: 07/30/2015 Goal Status: Active Ulcer/skin breakdown will have a volume reduction of 80% by week 12 Date Initiated: 07/30/2015 Goal Status: Active Ulcer/skin breakdown will heal within 14 weeks Date Initiated: 07/30/2015 Goal Status: Active Interventions: Assess patient/caregiver ability to obtain necessary supplies Assess patient/caregiver ability to perform ulcer/skin care regimen upon admission and as needed Provide education on ulcer and skin care Notes: Electronic Signature(s) Signed: 09/16/2015 4:52:05 PM By: Alric Quan Entered By: Alric Quan on 09/16/2015 14:18:53 Angela Patterson ([id number]) -------------------------------------------------------------------------------- Pain Assessment Details Patient Name: Angela Patterson. Date of Service: 09/16/2015 2:00 PM Medical Record Number: [id number] Patient Account Number: [id number] Date of Birth/Sex: [birth year] (82 y.o. Female) Treating RN: Carolyne Fiscal, Debi Primary Care Physician: Einar Pheasant Other Clinician: Referring Physician: Einar Pheasant Treating Physician/Extender: Frann Rider in Treatment: 6 Active Problems Location of Pain Severity and Description of Pain Patient Has Paino No Site Locations Pain Management and Medication Current Pain Management: Electronic Signature(s) Signed: 09/16/2015 4:52:05 PM By:  Alric Quan Entered By: Alric Quan on 09/16/2015 14:07:49 Angela Patterson ([id number]) -------------------------------------------------------------------------------- Patient/Caregiver Education Details Patient Name: Angela Patterson. Date of Service: 09/16/2015 2:00 PM Medical Record Number: [id number] Patient Account Number: [id number] Date of Birth/Gender: [birth year] (82 y.o. Female) Treating RN: Carolyne Fiscal, Debi Primary Care Physician: Einar Pheasant Other Clinician: Referring Physician: Einar Pheasant Treating Physician/Extender: Frann Rider in Treatment: 6 Education Assessment Education Provided To: Patient and Caregiver Education Topics Provided Wound/Skin Impairment: Handouts: Other: change dressing as ordered Methods: Demonstration, Explain/Verbal Responses: State content correctly Electronic Signature(s) Signed: 09/16/2015 4:52:05 PM By: Alric Quan Entered By: Alric Quan on 09/16/2015 14:35:16 Angela Patterson ([id number]) -------------------------------------------------------------------------------- Wound Assessment Details Patient Name: Angela Patterson. Date of Service: 09/16/2015 2:00 PM Medical Record Number: [id number] Patient Account Number: [id number] Date of Birth/Sex: Dec 29, 1920 (82 y.o. Female) Treating RN: Carolyne Fiscal, Debi Primary Care Physician: Einar Pheasant Other Clinician: Referring Physician: Einar Pheasant Treating Physician/Extender: Frann Rider in Treatment: 6 Wound Status Wound Number: 3 Primary Diabetic Wound/Ulcer of the Lower Etiology: Extremity Wound Location: Left Metatarsal head fifth - Plantar Wound Open Status: Wounding Event: Gradually Appeared Comorbid Cataracts, Glaucoma, Hypertension, Date Acquired: 07/01/2015 History: Type II Diabetes, History of pressure Weeks Of Treatment: 6 wounds, Neuropathy Clustered Wound: No Photos Photo Uploaded By: Alric Quan on 09/16/2015 16:46:54 Wound  Measurements Length: (cm) 0.7 Width: (cm) 0.9 Depth: (cm) 0.1 Area: (cm) 0.495 Volume: (cm) 0.049 % Reduction in Area: 89.5% % Reduction in Volume: 94.8% Epithelialization: None Tunneling: No Undermining: Yes Starting Position (o'clock): 12 Ending Position (o'clock): 3 Maximum Distance: (cm) 0.7 Wound Description Classification: Grade 1 Wound Margin: Distinct, outline attached Exudate Amount: Large Exudate Type: Serosanguineous Exudate Color: red, brown Foul  Odor After Cleansing: No Wound Bed Angela Patterson, Angela Patterson. ([id number]) Granulation Amount: Large (67-100%) Exposed Structure Granulation Quality: Red, Pink Fascia Exposed: No Necrotic Amount: Small (1-33%) Fat Layer Exposed: No Necrotic Quality: Eschar, Adherent Slough Tendon Exposed: No Muscle Exposed: No Joint Exposed: No Bone Exposed: No Limited to Skin Breakdown Periwound Skin Texture Texture Color No Abnormalities Noted: No No Abnormalities Noted: No Callus: Yes Atrophie Blanche: No Crepitus: No Cyanosis: No Excoriation: No Ecchymosis: No Fluctuance: No Erythema: No Friable: No Hemosiderin Staining: No Induration: No Mottled: No Localized Edema: No Pallor: No Rash: No Rubor: No Scarring: No Temperature / Pain Moisture Temperature: No Abnormality No Abnormalities Noted: No Tenderness on Palpation: Yes Dry / Scaly: No Maceration: No Moist: Yes Wound Preparation Ulcer Cleansing: Rinsed/Irrigated with Saline Topical Anesthetic Applied: Other: lidocaine 4%, Treatment Notes Wound #3 (Left, Plantar Metatarsal head fifth) 1. Cleansed with: Clean wound with Normal Saline 2. Anesthetic Topical Lidocaine 4% cream to wound bed prior to debridement 3. Peri-wound Care: Skin Prep 4. Dressing Applied: Aquacel Ag 5. Secondary Dressing Applied Dry Gauze 6. Footwear/Offloading device applied Felt/Foam 7. Secured with Angela Patterson ([id number]) Tape Notes special shoe from [employer] Electronic  Signature(s) Signed: 09/16/2015 4:52:05 PM By: Alric Quan Entered By: Alric Quan on 09/16/2015 14:17:17 Angela Patterson ([id number]) -------------------------------------------------------------------------------- [place] Details Patient Name: Angela Patterson. Date of Service: 09/16/2015 2:00 PM Medical Record Number: [id number] Patient Account Number: [id number] Date of Birth/Sex: 05/07/1921 (82 y.o. Female) Treating RN: Carolyne Fiscal, Debi Primary Care Physician: Einar Pheasant Other Clinician: Referring Physician: Einar Pheasant Treating Physician/Extender: Frann Rider in Treatment: 6 Vital Signs Time Taken: 14:07 Temperature (F): 98.4 Height (in): 65 Pulse (bpm): 65 Weight (lbs): 107 Respiratory Rate (breaths/min): 20 Body Mass Index (BMI): 17.8 Blood Pressure (mmHg): 124/69 Reference Range: 80 - 120 mg / dl Electronic Signature(s) Signed: 09/16/2015 4:52:05 PM By: Alric Quan Entered By: Alric Quan on 09/16/2015 14:10:33

## 2015-09-23 ENCOUNTER — Encounter: Payer: Medicare Other | Attending: Surgery | Admitting: Surgery

## 2015-09-23 DIAGNOSIS — I10 Essential (primary) hypertension: Secondary | ICD-10-CM | POA: Insufficient documentation

## 2015-09-23 DIAGNOSIS — E11621 Type 2 diabetes mellitus with foot ulcer: Secondary | ICD-10-CM | POA: Diagnosis not present

## 2015-09-23 DIAGNOSIS — L84 Corns and callosities: Secondary | ICD-10-CM | POA: Diagnosis not present

## 2015-09-23 DIAGNOSIS — L97522 Non-pressure chronic ulcer of other part of left foot with fat layer exposed: Secondary | ICD-10-CM | POA: Diagnosis not present

## 2015-09-23 DIAGNOSIS — E1140 Type 2 diabetes mellitus with diabetic neuropathy, unspecified: Secondary | ICD-10-CM | POA: Insufficient documentation

## 2015-09-23 DIAGNOSIS — L97521 Non-pressure chronic ulcer of other part of left foot limited to breakdown of skin: Secondary | ICD-10-CM | POA: Diagnosis not present

## 2015-09-23 NOTE — Progress Notes (Addendum)
Angela Patterson, Angela Patterson ([id number]) Visit Report for 09/23/2015 Arrival Information Details Patient Name: Angela Patterson, Angela Patterson. Date of Service: 09/23/2015 1:30 PM Medical Record Number: [id number] Patient Account Number: [id number] Date of Birth/Sex: 1921/07/18 (82 y.o. Female) Treating RN: Afful, RN, BSN, Velva Harman Primary Care Physician: Einar Pheasant Other Clinician: Referring Physician: Einar Pheasant Treating Physician/Extender: Frann Rider in Treatment: 7 Visit Information History Since Last Visit Added or deleted any medications: No Patient Arrived: Ambulatory Any new allergies or adverse reactions: No Arrival Time: 13:29 Had a fall or experienced change in No Accompanied By: dtr activities of daily living that may affect Transfer Assistance: None risk of falls: Patient Identification Verified: Yes Signs or symptoms of abuse/neglect since last No Secondary Verification Process Yes visito Completed: Hospitalized since last visit: No Patient Requires Transmission-Based No Has Dressing in Place as Prescribed: Yes Precautions: Pain Present Now: No Patient Has Alerts: No Electronic Signature(s) Signed: 09/23/2015 2:30:40 PM By: Regan Lemming BSN, RN Entered By: Regan Lemming on 09/23/2015 13:29:46 Angela Patterson ([id number]) -------------------------------------------------------------------------------- Encounter Discharge Information Details Patient Name: Angela Patterson. Date of Service: 09/23/2015 1:30 PM Medical Record Number: [id number] Patient Account Number: [id number] Date of Birth/Sex: Dec 28, 1920 (82 y.o. Female) Treating RN: Afful, RN, BSN, Velva Harman Primary Care Physician: Einar Pheasant Other Clinician: Referring Physician: Einar Pheasant Treating Physician/Extender: Frann Rider in Treatment: 7 Encounter Discharge Information Items Discharge Pain Level: 0 Discharge Condition: Stable Ambulatory Status: Ambulatory Discharge Destination: Home Transportation: Private  Auto Accompanied By: dtr in law Schedule Follow-up Appointment: No Medication Reconciliation completed and provided to Patient/Care No Angela Patterson: Provided on Clinical Summary of Care: 09/23/2015 Form Type Recipient Paper Patient EG Electronic Signature(s) Signed: 09/23/2015 2:08:11 PM By: Ruthine Dose Entered By: Ruthine Dose on 09/23/2015 14:08:11 Angela Patterson ([id number]) -------------------------------------------------------------------------------- Lower Extremity Assessment Details Patient Name: Angela Patterson. Date of Service: 09/23/2015 1:30 PM Medical Record Number: [id number] Patient Account Number: [id number] Date of Birth/Sex: 1920/08/09 (82 y.o. Female) Treating RN: Afful, RN, BSN, Velva Harman Primary Care Physician: Einar Pheasant Other Clinician: Referring Physician: Einar Pheasant Treating Physician/Extender: Frann Rider in Treatment: 7 Vascular Assessment Pulses: Posterior Tibial Dorsalis Pedis Palpable: [Left:Yes] Extremity colors, hair growth, and conditions: Extremity Color: [Left:Normal] Hair Growth on Extremity: [Left:No] Temperature of Extremity: [Left:Warm] Capillary Refill: [Left:< 3 seconds] Toe Nail Assessment Left: Right: Thick: No Discolored: No Deformed: No Improper Length and Hygiene: No Electronic Signature(s) Signed: 09/23/2015 2:30:40 PM By: Regan Lemming BSN, RN Entered By: Regan Lemming on 09/23/2015 13:30:14 Angela Patterson ([id number]) -------------------------------------------------------------------------------- Multi Wound Chart Details Patient Name: Angela Patterson. Date of Service: 09/23/2015 1:30 PM Medical Record Number: [id number] Patient Account Number: [id number] Date of Birth/Sex: [birth year] (82 y.o. 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] Comorbid History: [3:Cataracts, Glaucoma, Hypertension, Type II Diabetes, History of pressure wounds, Neuropathy] [N/A:N/A] Date Acquired: [3:07/01/2015] [N/A:N/A] Weeks of Treatment: [3:7] [N/A:N/A] Wound Status: [3:Open] [N/A:N/A] Measurements L x W x D 2x2x0.3 [N/A:N/A] (cm) Area (cm) : [3:3.142] [N/A:N/A] Volume (cm) : [3:0.942] [N/A:N/A] % Reduction in Area: [3:33.30%] [N/A:N/A] % Reduction in Volume: 0.00% [N/A:N/A] Classification: [3:Grade 1] [N/A:N/A] Exudate Amount: [  3:Large] [N/A:N/A] Exudate Type: [3:Serosanguineous] [N/A:N/A] Exudate Color: [3:red, brown] [N/A:N/A] Foul Odor After [3:Yes] [N/A:N/A] Cleansing: Odor Anticipated Due to No [N/A:N/A] Product Use: Wound Margin: [3:Distinct, outline attached N/A] Granulation Amount: [3:Large (67-100%)] [N/A:N/A] Granulation Quality: Red, Pink N/A N/A Necrotic Amount: Small (1-33%) N/A N/A Necrotic Tissue: Eschar, Adherent Slough N/A N/A Exposed Structures: Fascia: No N/A N/A Fat: No Tendon: No Muscle: No Joint: No Bone: No Limited to Skin Breakdown Epithelialization: None N/A N/A Periwound Skin Texture: Callus: Yes N/A N/A Edema: No Excoriation: No Induration: No Crepitus: No Fluctuance: No Friable: No Rash: No Scarring: No Periwound Skin Moist: Yes N/A N/A Moisture: Maceration: No Dry/Scaly: No Periwound Skin Color: Atrophie Blanche: No N/A N/A Cyanosis: No Ecchymosis: No Erythema: No Hemosiderin Staining: No Mottled: No Pallor: No Rubor: No Temperature: No Abnormality N/A N/A Tenderness on Yes N/A N/A Palpation: Wound  Preparation: Ulcer Cleansing: N/A N/A Rinsed/Irrigated with Saline Topical Anesthetic Applied: Other: lidocaine 4% Treatment Notes Electronic Signature(s) Signed: 09/23/2015 2:30:40 PM By: Regan Lemming BSN, RN Entered By: Regan Lemming on 09/23/2015 Angela Patterson, [place]. ([name] ([id number]) -------------------------------------------------------------------------------- [place]thalia Details Patient Name: Angela Patterson, Angela Patterson. Date of Service: 09/23/2015 1:30 PM Medical Record Number: [id number] Patient Account Number: [id number] Date of Birth/Sex: [birth year] (82 y.o. Female) Treating RN: Afful, RN, BSN, Velva Harman Primary Care Physician: Einar Pheasant Other Clinician: Referring Physician: Einar Pheasant Treating Physician/Extender: Frann Rider in Treatment: 7 Active Inactive Electronic Signature(s) Signed: 10/18/2015 1:08:23 PM By: Regan Lemming BSN, RN Previous Signature: 09/23/2015 2:30:40 PM Version By: Regan Lemming BSN, RN Entered By: Regan Lemming on 10/18/2015 13:08:22 Angela Patterson ([id number]) -------------------------------------------------------------------------------- Pain Assessment Details Patient Name: Angela Patterson. Date of Service: 09/23/2015 1:30 PM Medical Record Number: [id number] Patient Account Number: [id number] Date of Birth/Sex: 1921/05/03 (82 y.o. Female) Treating RN: Baruch Gouty, RN, BSN, Velva Harman Primary Care Physician: Einar Pheasant Other Clinician: Referring Physician: Einar Pheasant Treating Physician/Extender: Frann Rider in Treatment: 7 Active Problems Location of Pain Severity and Description of Pain Patient Has Paino No Site Locations Pain Management and Medication Current Pain Management: Electronic Signature(s) Signed: 09/23/2015 2:30:40 PM By: Regan Lemming BSN, RN Entered By: Regan Lemming on 09/23/2015 13:32:48 Angela Patterson  ([id number]) -------------------------------------------------------------------------------- Patient/Caregiver Education Details Patient Name: Angela Patterson. Date of Service: 09/23/2015 1:30 PM Medical Record Number: [id number] Patient Account Number: [id number] Date of Birth/Gender: [birth year] (82 y.o. Female) Treating RN: Baruch Gouty, RN, BSN, Velva Harman Primary Care Physician: Einar Pheasant Other Clinician: Referring Physician: Einar Pheasant Treating Physician/Extender: Frann Rider in Treatment: 7 Education Assessment Education Provided To: Patient Education Topics Provided Basic Hygiene: Methods: Explain/Verbal Responses: State content correctly Welcome [facility]: Methods: Explain/Verbal Responses: State content correctly Wound/Skin Impairment: Methods: Explain/Verbal Responses: State content correctly Electronic Signature(s) Signed: 09/23/2015 2:30:40 PM By: Regan Lemming BSN, RN Entered By: Regan Lemming on 09/23/2015 14:01:40 Angela Patterson ([id number]) -------------------------------------------------------------------------------- Wound Assessment Details Patient Name: Angela Patterson. Date of Service: 09/23/2015 1:30 PM Medical Record Number: [id number] Patient Account Number: [id number] Date of Birth/Sex: 10/02/1920 (82 y.o. Female) Treating RN: Afful, RN, BSN, Velva Harman Primary Care Physician: Einar Pheasant Other Clinician: Referring Physician: Einar Pheasant Treating Physician/Extender: Frann Rider in Treatment: 7 Wound Status Wound Number: 3 Primary Diabetic Wound/Ulcer of the Lower Etiology: Extremity Wound Location: Left Metatarsal head fifth - Plantar Wound Open Status: Wounding Event: Gradually Appeared Comorbid Cataracts, Glaucoma, Hypertension, Date Acquired: 07/01/2015 History: Type II Diabetes, History of pressure Weeks Of Treatment: 7 wounds, Neuropathy  Clustered Wound: No Photos Photo Uploaded By: Regan Lemming on 09/23/2015  14:27:59 Wound Measurements Length: (cm) 2 Width: (cm) 2 Depth: (cm) 0.3 Area: (cm) 3.142 Volume: (cm) 0.942 % Reduction in Area: 33.3% % Reduction in Volume: 0% Epithelialization: None Tunneling: No Undermining: No Wound Description Classification: Grade 1 Foul Odor Af Wound Margin: Distinct, outline attached Due to Produ Exudate Amount: Large Exudate Type: Serosanguineous Exudate Color: red, brown ter Cleansing: Yes ct Use: No Wound Bed Granulation Amount: Large (67-100%) Exposed Structure Granulation Quality: Red, Pink Fascia Exposed: No Necrotic Amount: Small (1-33%) Fat Layer Exposed: No Angela Patterson, Angela Patterson ([id number]) Necrotic Quality: Eschar, Adherent Slough Tendon Exposed: No Muscle Exposed: No Joint Exposed: No Bone Exposed: No Limited to Skin Breakdown Periwound Skin Texture Texture Color No Abnormalities Noted: No No Abnormalities Noted: No Callus: Yes Atrophie Blanche: No Crepitus: No Cyanosis: No Excoriation: No Ecchymosis: No Fluctuance: No Erythema: No Friable: No Hemosiderin Staining: No Induration: No Mottled: No Localized Edema: No Pallor: No Rash: No Rubor: No Scarring: No Temperature / Pain Moisture Temperature: No Abnormality No Abnormalities Noted: No Tenderness on Palpation: Yes Dry / Scaly: No Maceration: No Moist: Yes Wound Preparation Ulcer Cleansing: Rinsed/Irrigated with Saline Topical Anesthetic Applied: Other: lidocaine 4%, Treatment Notes Wound #3 (Left, Plantar Metatarsal head fifth) 1. Cleansed with: Clean wound with Normal Saline 4. Dressing Applied: Aquacel Ag 5. Secondary Dressing Applied Foam 6. Footwear/Offloading device applied Diabetic shoe Notes special shoe from [employer]) Signed: 09/23/2015 2:30:40 PM By: Regan Lemming BSN, RN Entered By: Regan Lemming on 09/23/2015 13:39:53 Angela Patterson  ([id number]) -------------------------------------------------------------------------------- Vitals Details Patient Name: Angela Patterson. Date of Service: 09/23/2015 1:30 PM Medical Record Number: [id number] Patient Account Number: [id number] Date of Birth/Sex: 03/19/1921 (82 y.o. Female) Treating RN: Afful, RN, BSN, Velva Harman Primary Care Physician: Einar Pheasant Other Clinician: Referring Physician: Einar Pheasant Treating Physician/Extender: Frann Rider in Treatment: 7 Vital Signs Time Taken: 13:34 Temperature (F): 97.8 Height (in): 65 Pulse (bpm): 72 Weight (lbs): 107 Respiratory Rate (breaths/min): 18 Body Mass Index (BMI): 17.8 Blood Pressure (mmHg): 144/52 Reference Range: 80 - 120 mg / dl Electronic Signature(s) Signed: 09/23/2015 2:30:40 PM By: Regan Lemming BSN, RN Entered By: Regan Lemming on 09/23/2015 13:34:59

## 2015-09-25 NOTE — Progress Notes (Signed)
[name], [name] ([id number]) Visit Report for 09/23/2015 Chief Complaint Document Details Patient Name: Angela Patterson, Angela Patterson. Date of Service: 09/23/2015 1:30 PM Medical Record Patient Account Number: [id number] Number: Afful, RN, BSN, Treating RN: 14-Oct-1920 [phone] y.o. Velva Harman Date of Birth/Sex: Female) Other Clinician: Primary Care Physician: Einar Pheasant Treating Christin Fudge Referring Physician: Einar Pheasant Physician/Extender: Suella Grove in Treatment: 7 Information Obtained from: Patient Chief Complaint Patient presents to the wound care center for a consult due non healing wound Pleasant 82 year old comes along with her daughter with a recurrent ulceration and callosity on her left foot. she has had this for about 3 weeks now. Electronic Signature(s) Signed: 09/23/2015 3:31:34 PM By: Christin Fudge MD, FACS Entered By: Christin Fudge on 09/23/2015 15:31:33 Angela Patterson ([id number]) -------------------------------------------------------------------------------- Debridement Details Patient Name: Angela Patterson. Date of Service: 09/23/2015 1:30 PM Medical Record Patient Account Number: [id number] Number: Afful, RN, BSN, Treating RN: [birth year] [phone] y.o. Velva Harman Date of Birth/Sex: Female) Other Clinician: Primary Care Physician: Einar Pheasant Treating Christin Fudge Referring Physician: Einar Pheasant Physician/Extender: Suella Grove in Treatment: 7 Debridement Performed for Wound #3 Left,Plantar Metatarsal head fifth Assessment: Performed By: Physician Christin Fudge, MD Debridement: Debridement Pre-procedure Yes Verification/Time Out Taken: Start Time: 13:50 Pain Control: Lidocaine 4% Topical Solution Level: Skin/Subcutaneous Tissue Total Area Debrided (L x 2 (cm) x 2 (cm) = 4 (cm) W): Tissue and other Viable, Non-Viable, Callus, Exudate, Fibrin/Slough, Skin, Subcutaneous material debrided: Instrument: Forceps, Scissors Bleeding: Moderate Hemostasis Achieved:  Silver Nitrate End Time: 13:58 Procedural Pain: 0 Post Procedural Pain: 0 Response to Treatment: Procedure was tolerated well Post Debridement Measurements of Total Wound Length: (cm) 2 Width: (cm) 2 Depth: (cm) 0.3 Volume: (cm) 0.942 Post Procedure Diagnosis Same as Pre-procedure Electronic Signature(s) Signed: 09/23/2015 3:31:27 PM By: Christin Fudge MD, FACS Signed: 09/24/2015 4:45:50 PM By: Regan Lemming BSN, RN Previous Signature: 09/23/2015 2:30:40 PM Version By: Regan Lemming BSN, RN Entered By: Christin Fudge on 09/23/2015 15:31:26 Angela Patterson ([id number], ARRASMITH ([id number]) -------------------------------------------------------------------------------- HPI Details Patient Name: Angela Patterson. Date of Service: 09/23/2015 1:30 PM Medical Record Patient Account Number: [id number] Number: Afful, RN, BSN, Treating RN: [birth year] [phone] y.o. Velva Harman Date of Birth/Sex: Female) Other Clinician: Primary Care Physician: Einar Pheasant Treating Christin Fudge Referring Physician: Einar Pheasant Physician/Extender: Weeks in Treatment: 7 History of Present Illness Location: left foot Quality: mild pain occasionally. Severity: the problem is been getting worse for the last 3 weeks Duration: she's had this for almost a year now on and off. Timing: pain is intermittent and occasional. Context: she was seen by me earlier in March and again in May and was healed by early June 2016 Modifying Factors: she has had diabetic shoes made and is compliant with wearing these. Associated Signs and Symptoms: worse on standing on her feet for too long. HPI Description: this very pleasant 82 year old patient who is extremely active and ambulating all day has had problems with the left foot for over a year. Her daughter who is her caregiver says she drives around and even goes around and does her chores by herself. this is the third recurrence and after the last time we saw in June and  discharged her her daughter has got her to pace of diabetic shoes and she's been wearing these regularly. She has been on the shoes for about 6 months now. Her diabetes is under control and her blood sugars run from a anywhere between 110 to 130 and she checks them twice a day.  no fever or discharge. During her initial visit, an x-ray of her left foot was done on 09/26/2014. There was no cortical erosion noted there was mild osteopenia in the distal first metatarsal. Radiologist has recommended a MRI of the left foot to exclude osteomyelitis. MRI done on 10/09/2014 reveals that the area where she had a phlegmon is without osteomyelitis. the bone marrow and other areas are compatible with reactive edema rather than osteomyelitis. 08/06/2015 -- x-ray of the left foot done on 07/30/2015 shows IMPRESSION:Soft tissue wound with a bandage is noted over the plantar aspect of the distal left foot. Diffuse osteopenia degenerative change. No acute bony abnormality 09/16/2015 -- last week she had gone to [facility] and met with Harrington Challenger who had spoken to me over the phone. Instead of a DH walking boot he has given her an offloading boot which will make her more steady and this boot is called DH healing sandal with a custom-made insole to offload this area. 09/23/2015 -- the patient says she's been very compliant and using the boot all the time but she continues to have a lot of drainage and from a lot of macerated callus. Electronic Signature(s) Signed: 09/23/2015 3:32:21 PM By: Christin Fudge MD, FACS Angela Patterson ([id number]) Entered By: Christin Fudge on 09/23/2015 15:32:20 Angela Patterson ([id number]) -------------------------------------------------------------------------------- Physical Exam Details Patient Name: Angela Patterson. Date of Service: 09/23/2015 1:30 PM Medical Record Patient Account Number: [id number] Number: Afful, RN, BSN, Treating  RN: [birth year] [phone] y.o. Velva Harman Date of Birth/Sex: Female) Other Clinician: Primary Care Physician: Einar Pheasant Treating Christin Fudge Referring Physician: Einar Pheasant Physician/Extender: Weeks in Treatment: 7 Constitutional . Pulse regular. Respirations normal and unlabored. Afebrile. . Eyes Nonicteric. Reactive to light. Ears, Nose, Mouth, and Throat Lips, teeth, and gums WNL.Marland Kitchen Moist mucosa without lesions. Neck supple and nontender. No palpable supraclavicular or cervical adenopathy. Normal sized without goiter. Respiratory WNL. No retractions.. Cardiovascular Pedal Pulses WNL. No clubbing, cyanosis or edema. Lymphatic No adneopathy. No adenopathy. No adenopathy. Musculoskeletal Adexa without tenderness or enlargement.. Digits and nails w/o clubbing, cyanosis, infection, petechiae, ischemia, or inflammatory conditions.. Integumentary (Hair, Skin) No suspicious lesions. No crepitus or fluctuance. No peri-wound warmth or erythema. No masses.Marland Kitchen Psychiatric Judgement and insight Intact.. No evidence of depression, anxiety, or agitation.. Notes using a forcep and scissors I have removed a lot of necrotic debris, macerated callus and subcutaneous necrotic tissue with a forcep and scissors and bleeding was brisk. I used silver nitrate sticks to control the bleeding. Electronic Signature(s) Signed: 09/23/2015 3:33:33 PM By: Christin Fudge MD, FACS Entered By: Christin Fudge on 09/23/2015 15:33:33 Angela Patterson ([id number]) -------------------------------------------------------------------------------- Physician Orders Details Patient Name: Angela Patterson. Date of Service: 09/23/2015 1:30 PM Medical Record Patient Account Number: [id number] Number: Afful, RN, BSN, Treating RN: 1921-02-20 [phone] y.o. Velva Harman Date of Birth/Sex: Female) Other Clinician: Primary Care Physician: Einar Pheasant Treating Christin Fudge Referring Physician: Einar Pheasant Physician/Extender: Suella Grove in Treatment: 7 Verbal / Phone Orders: Yes Clinician: Afful, RN, BSN, Rita Read Back and Verified: Yes Diagnosis Coding Wound Cleansing Wound #3 Left,Plantar Metatarsal head fifth o Cleanse wound with mild soap and water o May Shower, gently pat wound dry prior to applying new dressing. Anesthetic Wound #3 Left,Plantar Metatarsal head fifth o Topical Lidocaine 4% cream applied to wound bed prior to debridement Primary Wound Dressing Wound #3 Left,Plantar Metatarsal head fifth o Aquacel Ag Secondary Dressing Wound #3 Left,Plantar Metatarsal head fifth o Gauze and Kerlix/Conform   o Foam - felt Dressing Change Frequency Wound #3 Left,Plantar Metatarsal head fifth o Change dressing every day. Follow-up Appointments Wound #3 Left,Plantar Metatarsal head fifth o Return Appointment in 1 week. Off-Loading Wound #3 Left,Plantar Metatarsal head fifth o Other: - special shoe from [employer] Additional Orders / Instructions Wound #3 Left,Plantar Metatarsal head fifth o Increase protein intake. MARLENI, GALLARDO ([id number]) o Activity as tolerated o Other: - Keep blood sugars under control Medications-please add to medication list. Wound #3 Left,Plantar Metatarsal head fifth o P.O. Antibiotics - Doxy 160m BID x 14 days Patient Medications Allergies: Iodinated Contrast Media - IV Dye Notifications Medication Indication Start End doxycycline hyclate 09/23/2015 DOSE 1 - oral 100 mg capsule - 1 capsule oral bid Electronic Signature(s) Signed: 09/23/2015 3:30:18 PM By: BChristin FudgeMD, FACS Previous Signature: 09/23/2015 2:30:40 PM Version By: ARegan LemmingBSN, RN Entered By: BChristin Fudgeon 09/23/2015 15:30:18 GOtilio Patterson([id number] -------------------------------------------------------------------------------- Prescription 09/23/2015 Patient Name: GOtilio Patterson Physician: BChristin FudgeMD Date of Birth: [birth year]NPI#:  [id number]: F DEA#: [id number] #: [phone] #: Patient Address[address] [place] [address] [place] [phone] Iodinated Contrast Media - IV Dye Physician's Orders P.O. Antibiotics - Doxy 1071mBID x 14 days Signature(s): Date(s): Electronic Signature(s) Signed: 09/23/2015 3:57:47 PM By: BrChristin FudgeD, FACS Previous Signature: 09/23/2015 2:30:40 PM Version By: AfRegan LemmingSN, RN Entered By: BrChristin Fudgen 09/23/2015 15:30:33 GROtilio [id number]--------------------------------------------------------------------------------  Problem List Details Patient Name: GROtilio MiuDate of Service: 09/23/2015 1:30 PM Medical Record Patient Account Number: [id number]: Afful, RN, BSN, Treating RN: [phone].o. RiVelva Harmanate of Birth/Sex: Female) Other Clinician: Primary Care Physician: SCEinar Pheasantreating BrChristin Fudgeeferring Physician: SCEinar Pheasanthysician/Extender: WeSuella Groven Treatment: 7 Active Problems ICD-10 Encounter Code Description Active Date Diagnosis E11.621 Type 2 diabetes mellitus with foot ulcer 07/30/2015 Yes L97.522 Non-pressure chronic ulcer of other part of left foot with fat 07/30/2015 Yes layer exposed L84 Corns and callosities 07/30/2015 Yes Inactive Problems Resolved Problems Electronic Signature(s) Signed: 09/23/2015 3:30:58 PM By: BrChristin FudgeD, FACS Entered By: BrChristin Fudgen 09/23/2015 15:30:58 GROtilio [id number]-------------------------------------------------------------------------------- Progress Note Details Patient Name: GROtilio MiuDate of Service: 09/23/2015 1:30 PM Medical Record Patient Account Number: [id number]: Afful, RN, BSN, Treating RN: [birth year] [phone].o. RiVelva Harmanate of Birth/Sex: Female)  Other Clinician: Primary Care Physician: SCEinar Pheasantreating BrChristin Fudgeeferring Physician: SCEinar Pheasanthysician/Extender: WeSuella Groven Treatment: 7 Subjective Chief Complaint Information obtained from Patient Patient presents to the wound care center for a consult due non healing wound Pleasant [age]ear old comes along with her daughter with a recurrent ulceration and callosity on her left foot. she has had this for about 3 weeks now. History of Present Illness (HPI) The following HPI elements were documented for the patient's wound: Location: left foot Quality: mild pain occasionally. Severity: the problem is been getting worse for the last 3 weeks Duration: she's had this for almost a year now on and off. Timing: pain is intermittent and occasional. Context: she was seen by me earlier in March and again in May and was healed by early June 2016 Modifying Factors: she has had diabetic shoes made and is compliant with wearing these. Associated Signs and Symptoms: worse on standing on her feet for too long. this very pleasant [age]ear old patient who is  extremely active and ambulating all day has had problems with the left foot for over a year. Her daughter who is her caregiver says she drives around and even goes around and does her chores by herself. this is the third recurrence and after the last time we saw in June and discharged her her daughter has got her to pace of diabetic shoes and she's been wearing these regularly. She has been on the shoes for about 6 months now. Her diabetes is under control and her blood sugars run from a anywhere between 110 to 130 and she checks them twice a day. no fever or discharge. During her initial visit, an x-ray of her left foot was done on 09/26/2014. There was no cortical erosion noted there was mild osteopenia in the distal first metatarsal. Radiologist has recommended a MRI of the left foot to exclude osteomyelitis. MRI done on  10/09/2014 reveals that the area where she had a phlegmon is without osteomyelitis. the bone marrow and other areas are compatible with reactive edema rather than osteomyelitis. 08/06/2015 -- x-ray of the left foot done on 07/30/2015 shows IMPRESSION:Soft tissue wound with a bandage is noted over the plantar aspect of the distal left foot. Diffuse osteopenia degenerative change. No acute bony abnormality HERBERT, AGUINALDO ([id number]) 09/16/2015 -- last week she had gone to the [facility] and met with Harrington Challenger who had spoken to me over the phone. Instead of a DH walking boot he has given her an offloading boot which will make her more steady and this boot is called DH healing sandal with a custom-made insole to offload this area. 09/23/2015 -- the patient says she's been very compliant and using the boot all the time but she continues to have a lot of drainage and from a lot of macerated callus. Objective Constitutional Pulse regular. Respirations normal and unlabored. Afebrile. Vitals Time Taken: 1:34 PM, Height: 65 in, Weight: 107 lbs, BMI: 17.8, Temperature: 97.8 F, Pulse: 72 bpm, Respiratory Rate: 18 breaths/min, Blood Pressure: 144/52 mmHg. Eyes Nonicteric. Reactive to light. Ears, Nose, Mouth, and Throat Lips, teeth, and gums WNL.Marland Kitchen Moist mucosa without lesions. Neck supple and nontender. No palpable supraclavicular or cervical adenopathy. Normal sized without goiter. Respiratory WNL. No retractions.. Cardiovascular Pedal Pulses WNL. No clubbing, cyanosis or edema. Lymphatic No adneopathy. No adenopathy. No adenopathy. Musculoskeletal Adexa without tenderness or enlargement.. Digits and nails w/o clubbing, cyanosis, infection, petechiae, ischemia, or inflammatory conditions.Marland Kitchen Psychiatric Judgement and insight Intact.. No evidence of depression, anxiety, or agitation.. General Notes: using a forcep and scissors I have removed a lot of necrotic debris,  macerated callus and subcutaneous necrotic tissue with a forcep and scissors and bleeding was brisk. I used silver nitrate sticks to control the bleeding. LETHER, TESCH ([id number]) Integumentary (Hair, Skin) No suspicious lesions. No crepitus or fluctuance. No peri-wound warmth or erythema. No masses.. Wound #3 status is Open. Original cause of wound was Gradually Appeared. The wound is located on the Left,Plantar Metatarsal head fifth. The wound measures 2cm length x 2cm width x 0.3cm depth; 3.142cm^2 area and 0.942cm^3 volume. The wound is limited to skin breakdown. There is no tunneling or undermining noted. There is a large amount of serosanguineous drainage noted. The wound margin is distinct with the outline attached to the wound base. There is large (67-100%) red, pink granulation within the wound bed. There is a small (1-33%) amount of necrotic tissue within the wound bed including Eschar and Adherent Slough. The periwound skin  appearance exhibited: Callus, Moist. The periwound skin appearance did not exhibit: Crepitus, Excoriation, Fluctuance, Friable, Induration, Localized Edema, Rash, Scarring, Dry/Scaly, Maceration, Atrophie Blanche, Cyanosis, Ecchymosis, Hemosiderin Staining, Mottled, Pallor, Rubor, Erythema. Periwound temperature was noted as No Abnormality. The periwound has tenderness on palpation. Assessment Active Problems ICD-10 E11.621 - Type 2 diabetes mellitus with foot ulcer L97.522 - Non-pressure chronic ulcer of other part of left foot with fat layer exposed L84 - Corns and callosities In spite of every good effort, the patient continues to have a lot of discharge, maceration and I don't believe she is being very compliant with offloading and staying off her feet. She has the [facility] walking sandal with a custom insert and I have told her to wear it all through the day. She is also encouraged to stay off her feet as much as possible and I have also recommended local  care on a daily basis as her wound is getting very macerated. We would use aqua cell AG and a bordered foam. I will also put her on doxycycline 100 mg twice a day for 2 weeks. This has been discussed thoroughly with her caregiver is at the bedside and she will communicate this to the rest of the family Procedures Wound #3 Wound #3 is a Diabetic Wound/Ulcer of the Lower Extremity located on the [place] Metatarsal head ANITRA, DOXTATER. ([id number]) fifth . There was a Skin/Subcutaneous Tissue Debridement ([id number]) debridement with total area of 4 sq cm performed by Christin Fudge, MD. with the following instrument(s): Forceps and Scissors to remove Viable and Non-Viable tissue/material including Exudate, Fibrin/Slough, Skin, Callus, and Subcutaneous after achieving pain control using Lidocaine 4% Topical Solution. A time out was conducted prior to the start of the procedure. A Moderate amount of bleeding was controlled with Silver Nitrate. The procedure was tolerated well with a pain level of 0 throughout and a pain level of 0 following the procedure. Post Debridement Measurements: 2cm length x 2cm width x 0.3cm depth; 0.942cm^3 volume. Post procedure Diagnosis Wound #3: Same as Pre-Procedure Plan Wound Cleansing: Wound #3 Left,Plantar Metatarsal head fifth: Cleanse wound with mild soap and water May Shower, gently pat wound dry prior to applying new dressing. Anesthetic: Wound #3 Left,Plantar Metatarsal head fifth: Topical Lidocaine 4% cream applied to wound bed prior to debridement Primary Wound Dressing: Wound #3 Left,Plantar Metatarsal head fifth: Aquacel Ag Secondary Dressing: Wound #3 Left,Plantar Metatarsal head fifth: Gauze and Kerlix/Conform Foam - felt Dressing Change Frequency: Wound #3 Left,Plantar Metatarsal head fifth: Change dressing every day. Follow-up Appointments: Wound #3 Left,Plantar Metatarsal head fifth: Return Appointment in 1  week. Off-Loading: Wound #3 Left,Plantar Metatarsal head fifth: Other: - special shoe from [employer] Additional Orders / Instructions: Wound #3 Left,Plantar Metatarsal head fifth: Increase protein intake. Activity as tolerated Other: - Keep blood sugars under control Medications-please add to medication list.: Wound #3 Left,Plantar Metatarsal head fifth: P.O. Antibiotics - Doxy 15m BID x 14 days The following medication(s) was prescribed: doxycycline hyclate oral 100 mg capsule 1 1 capsule oral bid starting 09/23/2015 GOtilio Patterson ([id number] In spite of every good effort, the patient continues to have a lot of discharge, maceration and I don't believe she is being very compliant with offloading and staying off her feet. [facility] with a custom insert and I have told her to wear it all through the day. She is also encouraged to stay off her feet as much as possible and I have also recommended local care on  a daily basis as her wound is getting very macerated. We would use aqua cell AG and a bordered foam. I will also put her on doxycycline 100 mg twice a day for 2 weeks. This has been discussed thoroughly with her caregiver is at the bedside and she will communicate this to the rest of the family Electronic Signature(s) Signed: 09/23/2015 3:35:28 PM By: Christin Fudge MD, FACS Entered By: Christin Fudge on 09/23/2015 15:35:28 Angela Patterson ([id number]) -------------------------------------------------------------------------------- SuperBill Details Patient Name: Angela Patterson. Date of Service: 09/23/2015 Medical Record Patient Account Number: [id number] Number: Afful, RN, BSN, Treating RN: 1921/04/02 [phone] y.o. Velva Harman Date of Birth/Sex: Female) Other Clinician: Primary Care Physician: Einar Pheasant Treating Jasira Robinson Referring Physician: Einar Pheasant Physician/Extender: Suella Grove in Treatment: 7 Diagnosis Coding ICD-10 Codes Code  Description E11.621 Type 2 diabetes mellitus with 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 with fat layer exposed L84 Corns and callosities Facility Procedures CPT4 Code Description: [id number] 11042 - DEB SUBQ TISSUE 20 SQ CM/< ICD-10 Description Diagnosis E11.621 Type 2 diabetes mellitus with 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 L84 Corns and callosities Modifier: with fat laye Quantity: 1 r exposed Physician Procedures CPT4 Code Description: [id number] 11042 - WC PHYS SUBQ TISS 20 SQ CM ICD-10 Description Diagnosis E11.621 Type 2 diabetes mellitus with 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 L84 Corns and callosities Modifier: with fat layer Quantity: 1 exposed Electronic Signature(s) Signed: 09/23/2015 3:35:41 PM By: Christin Fudge MD, FACS Entered By: Christin Fudge on 09/23/2015 15:35:41

## 2015-09-30 ENCOUNTER — Ambulatory Visit: Payer: Medicare Other | Admitting: Surgery

## 2015-09-30 ENCOUNTER — Encounter: Payer: Self-pay | Admitting: Internal Medicine

## 2015-09-30 DIAGNOSIS — S91309D Unspecified open wound, unspecified foot, subsequent encounter: Secondary | ICD-10-CM

## 2015-10-01 ENCOUNTER — Telehealth: Payer: Self-pay | Admitting: *Deleted

## 2015-10-01 ENCOUNTER — Ambulatory Visit (INDEPENDENT_AMBULATORY_CARE_PROVIDER_SITE_OTHER): Payer: Medicare Other | Admitting: Sports Medicine

## 2015-10-01 ENCOUNTER — Encounter: Payer: Self-pay | Admitting: Sports Medicine

## 2015-10-01 DIAGNOSIS — E1142 Type 2 diabetes mellitus with diabetic polyneuropathy: Secondary | ICD-10-CM

## 2015-10-01 DIAGNOSIS — M79673 Pain in unspecified foot: Secondary | ICD-10-CM

## 2015-10-01 DIAGNOSIS — L97522 Non-pressure chronic ulcer of other part of left foot with fat layer exposed: Secondary | ICD-10-CM

## 2015-10-01 DIAGNOSIS — L89891 Pressure ulcer of other site, stage 1: Secondary | ICD-10-CM | POA: Diagnosis not present

## 2015-10-01 MED ORDER — MUPIROCIN 2 % EX OINT: 1.0000 "application " | TOPICAL_OINTMENT | Freq: Two times a day (BID) | CUTANEOUS | Status: DC

## 2015-10-01 MED ORDER — MUPIROCIN CALCIUM 2 % EX CREA: 1.0000 "application " | TOPICAL_CREAM | Freq: Two times a day (BID) | CUTANEOUS | Status: DC

## 2015-10-01 NOTE — Progress Notes (Signed)
Patient ID: Angela Patterson, female   DOB: [birth year], 82 y.o.   MRN: [id number] Subjective: Angela Patterson is a 82 y.o. female patient seen in office for evaluation of ulceration of the sub met 1 ulceration on left foot; Patient has been going to wound care center without improvement for > 40month and was being treated there with Aquacel and PO antibiotics currently on Doxycycline. Patient is assisted by daughter who states that she wants something more to be done for her mother because she's not making improvements with wound care center. Patient's daughter also states that her sister ordered DermaWound and she was wondering if this is ok to use once they get it in the mail.  Patient has a history of diabetes and a blood glucose level  today of 140 mg/dl.  Patient is changing the dressing using Aquacel currently at home/ with help of daughter. Denies nausea/fever/vomiting/chills/night sweats/shortness of breath/pain. Patient has no other pedal complaints at this time.  Patient Active Problem List   Diagnosis Date Noted  . Open wound of foot excluding toes without complication 061/53/7943 . Health care maintenance 10/27/2014  . Weight loss 12/27/2013  . [place]ocyt[place] ([place] 11/20/2012  . Hypertension 07/21/2012  . Hypercholesterolemia 07/21/2012  . History of CVA (cerebrovascular accident) 07/21/2012  . Diabetes mellitus ([place] 07/21/2012  . Cerebral artery occlusion with cerebral infarction ([place] 07/21/2012  . Essential (primary) hypertension 07/21/2012  . Pure hypercholesterolemia 07/21/2012  . Type 2 diabetes mellitus ([place] 07/21/2012   Current Outpatient Prescriptions on File Prior to Visit  Medication Sig Dispense Refill  . amLODipine (NORVASC) 5 MG tablet Take 1 tablet by mouth two  times daily 180 tablet 1  . amoxicillin-clavulanate (AUGMENTIN) 875-125 MG tablet Take 1 tablet by mouth 2 (two) times daily. 20 tablet 0  . clopidogrel (PLAVIX) 75 MG tablet Take 1 tablet by mouth  every day  90 tablet 3  . Lancet Devices (ONE TOUCH DELICA LANCING DEV) MISC Use twice daily Dx: 250.00 100 each 5  . metFORMIN (GLUCOPHAGE) 500 MG tablet Take 1 tablet by mouth  twice a day as directed 180 tablet 1  . Multiple Vitamin (MULTIVITAMIN) tablet Take 1 tablet by mouth daily.    . ONE TOUCH ULTRA TEST test strip TEST BG TWICE DAILY 100 each 3  . timolol (BETIMOL) 0.5 % ophthalmic solution 1 drop 2 (two) times daily.     No current facility-administered medications on file prior to visit.   Allergies  Allergen Reactions  . Actos [Pioglitazone] Swelling  . Contrast Media [Iodinated Diagnostic Agents] Swelling  . Prandin [Repaglinide] Swelling  . Pravastatin Sodium     cramps    Recent Results (from the past 2160 hour(s))  CBC with Differential/Platelet     Status: None   Collection Time: 07/26/15  9:34 AM  Result Value Ref Range   WBC 6.8 4.0 - 10.5 K/uL   RBC 4.67 3.87 - 5.11 Mil/uL   Hemoglobin 14.5 12.0 - 15.0 g/dL   HCT 43.8 36.0 - 46.0 %   MCV 93.6 78.0 - 100.0 fl   MCHC 33.1 30.0 - 36.0 g/dL   RDW 12.7 11.5 - 15.5 %   Platelets 153.0 150.0 - 400.0 K/uL   Neutrophils Relative % 58.6 43.0 - 77.0 %   Lymphocytes Relative 29.2 12.0 - 46.0 %   Monocytes Relative 9.3 3.0 - 12.0 %   Eosinophils Relative 2.4 0.0 - 5.0 %   Basophils Relative 0.5 0.0 - 3.0 %  Neutro Abs 4.0 1.4 - 7.7 K/uL   Lymphs Abs 2.0 0.7 - 4.0 K/uL   Monocytes Absolute 0.6 0.1 - 1.0 K/uL   Eosinophils Absolute 0.2 0.0 - 0.7 K/uL   Basophils Absolute 0.0 0.0 - 0.1 K/uL  Lipid panel     Status: Abnormal   Collection Time: 07/26/15  9:34 AM  Result Value Ref Range   Cholesterol 181 0 - 200 mg/dL    Comment: ATP III Classification       Desirable:  < 200 mg/dL               Borderline High:  200 - 239 mg/dL          High:  > = 240 mg/dL   Triglycerides 144.0 0.0 - 149.0 mg/dL    Comment: Normal:  <150 mg/dLBorderline High:  150 - 199 mg/dL   HDL 46.50 >39.00 mg/dL   VLDL 28.8 0.0 - 40.0 mg/dL   LDL  Cholesterol 105 (H) 0 - 99 mg/dL   Total CHOL/HDL Ratio 4     Comment:                Men          Women1/2 Average Risk     3.4          3.3Average Risk          5.0          4.42X Average Risk          9.6          7.13X Average Risk          15.0          11.0                       NonHDL 134.05     Comment: NOTE:  Non-HDL goal should be 30 mg/dL higher than patient's LDL goal (i.e. LDL goal of < 70 mg/dL, would have non-HDL goal of < 100 mg/dL)  Hepatic function panel     Status: None   Collection Time: 07/26/15  9:34 AM  Result Value Ref Range   Total Bilirubin 0.9 0.2 - 1.2 mg/dL   Bilirubin, Direct 0.2 0.0 - 0.3 mg/dL   Alkaline Phosphatase 68 39 - 117 U/L   AST 17 0 - 37 U/L   ALT 11 0 - 35 U/L   Total Protein 7.2 6.0 - 8.3 g/dL   Albumin 4.2 3.5 - 5.2 g/dL  Hemoglobin A1c     Status: Abnormal   Collection Time: 07/26/15  9:34 AM  Result Value Ref Range   Hgb A1c MFr Bld 6.6 (H) 4.6 - 6.5 %    Comment: Glycemic Control Guidelines for People with Diabetes:Non Diabetic:  <6%Goal of Therapy: <7%Additional Action Suggested:  >4%   Basic metabolic panel     Status: Abnormal   Collection Time: 07/26/15  9:34 AM  Result Value Ref Range   Sodium 140 135 - 145 mEq/L   Potassium 4.4 3.5 - 5.1 mEq/L   Chloride 102 96 - 112 mEq/L   CO2 30 19 - 32 mEq/L   Glucose, Bld 150 (H) 70 - 99 mg/dL   BUN 15 6 - 23 mg/dL   Creatinine, Ser 0.59 0.40 - 1.20 mg/dL   Calcium 9.7 8.4 - 10.5 mg/dL   GFR 101.24 >60.00 mL/min    Objective: There were no vitals filed for this visit.  General: Patient  is awake, alert, oriented x 3 and in no acute distress.  Dermatology: Skin is warm and dry bilateral with a full thickness ulceration present  Sub met 1 left foot. Ulceration measures 2 cm x 1 cm x 0.5cm post debridement. There is a  Keratotic border with subdermal heme with a granular base. The ulceration does not  probe to bone. There is no malodor, no active drainage, no erythema, no edema. No acute  signs of infection. Nails are short and thickened, currently asymptomatic.    Vascular: Dorsalis Pedis pulse = 1/4 Bilateral,  Posterior Tibial pulse = 1/4 Bilateral,  Capillary Fill Time < 5 seconds  Neurologic: Epicritic sensation absent to the level of ankle using the 5.07/10g Semmes Weinstein Monofilament bilateral.  Musculosketal: Asymptomatic L>R bunion and hammertoes. No Pain with palpation to ulcerated area on left foot. No pain with compression to calves bilateral.  Assessment and Plan:  Problem List Items Addressed This Visit    None    Visit Diagnoses    Foot ulcer with fat layer exposed, left ([place])    -  Primary    sub met 1     Diabetic polyneuropathy associated with type 2 diabetes mellitus (HCC)        Foot pain, unspecified laterality          -Examined patient and discussed the progression of the wound and treatment alternatives. -Previous Xrays reviewed - Excisionally dedbrided ulceration to healthy bleeding borders using a sterile chisel  blade. -Applied topical antibiotic and dry sterile dressing and instructed patient to continue with daily dressings at home consisting of bactroban and bandaid/dry sterile dressing until dermawound is recieved. -Cont with offloading wound healing shoe -Will request biologic to assist with wound closure; Affinity fresh allograft  - Advised patient to go to the ER or return to office if the wound worsens or if constitutional symptoms are present. -Patient to return to office in 2 weeks for follow up care and evaluation or sooner if problems arise.  Landis Martins, DPM

## 2015-10-01 NOTE — Telephone Encounter (Signed)
Amy - [facility] states Mupirocin cream is $100.00 and the ointment is $20.00, they would like to change to the ointment.  Dr. Marcene Duos change to ointment.

## 2015-10-02 ENCOUNTER — Telehealth: Payer: Self-pay | Admitting: *Deleted

## 2015-10-02 NOTE — Telephone Encounter (Addendum)
-----   Message from Landis Martins, [place] sent at 10/01/2015  9:50 PM EDT ----- Regarding: Affinity fresh allograft Hi Noe [place] Can we request for Affinity Fresh Allograft for left foot ulceration. Richardson Landry from [facility]  Thanks Dr. Cannon Kettle.  10/02/2015-Contacted Richardson Landry of [facility] to order Affinity Fresh Allograft for pt.  Richardson Landry request pt's demographics and wound measurement sent to his email. Emailed requested pt information to:  [email or web address]  10/04/2015-Steve Thera Flake from [facility] will not discuss pt coverage with him, must call [phone] Benefits Coordinators, and once coverage was confirmed, call him and he would order the Affinity Fresh Allograft..  I left message with Baker Janus [phone] requesting help getting prior authorization for the Affinity Fresh Allograft and left my office number.  Rudene Christians states will send a 1.  [employer] Agreement that can be signed by an authority to disclose pt information, 2. Pt intake form, 3. Pt HIPPA form from the office and their office would see if needed prior approval, they would do the PA for insurances other than government or Medicare, but would give [place] the contact information to do so.

## 2015-10-02 NOTE — Telephone Encounter (Signed)
See my chart message.  Order placed for referral to wound center of her choice.

## 2015-10-15 ENCOUNTER — Encounter: Payer: Self-pay | Admitting: Sports Medicine

## 2015-10-15 ENCOUNTER — Ambulatory Visit (INDEPENDENT_AMBULATORY_CARE_PROVIDER_SITE_OTHER): Payer: Medicare Other | Admitting: Sports Medicine

## 2015-10-15 DIAGNOSIS — L89891 Pressure ulcer of other site, stage 1: Secondary | ICD-10-CM | POA: Diagnosis not present

## 2015-10-15 DIAGNOSIS — E1142 Type 2 diabetes mellitus with diabetic polyneuropathy: Secondary | ICD-10-CM

## 2015-10-15 DIAGNOSIS — M79673 Pain in unspecified foot: Secondary | ICD-10-CM

## 2015-10-15 DIAGNOSIS — L97521 Non-pressure chronic ulcer of other part of left foot limited to breakdown of skin: Secondary | ICD-10-CM

## 2015-10-15 NOTE — Progress Notes (Signed)
Patient ID: Angela Patterson, female   DOB: 1920/12/07, 82 y.o.   MRN: [id number]   Subjective: Angela Patterson is a 82 y.o. female patient seen in office for follow up evaluation of ulceration sub met 1 on left foot; Patient used bactroban for a few days but now has started with Dermawound with improvement. Patient has a history of diabetes and a blood glucose level today of 130 mg/dl.  Patient is changing the dressing using Dermawound currently at home/ with help of daughter. Denies nausea/fever/vomiting/chills/night sweats/shortness of breath/pain. Patient has no other pedal complaints at this time.  Patient is assisted by son at this visit.   Patient Active Problem List   Diagnosis Date Noted  . Open wound of foot excluding toes without complication 43/15/4008  . Health care maintenance 10/27/2014  . Weight loss 12/27/2013  . Thrombocytopenia ([place]) 11/20/2012  . Hypertension 07/21/2012  . Hypercholesterolemia 07/21/2012  . History of CVA (cerebrovascular accident) 07/21/2012  . Diabetes mellitus ([place]) 07/21/2012  . Cerebral artery occlusion with cerebral infarction ([place]) 07/21/2012  . Essential (primary) hypertension 07/21/2012  . Pure hypercholesterolemia 07/21/2012  . Type 2 diabetes mellitus ([place]) 07/21/2012   Current Outpatient Prescriptions on File Prior to Visit  Medication Sig Dispense Refill  . amLODipine (NORVASC) 5 MG tablet Take 1 tablet by mouth two  times daily 180 tablet 1  . amoxicillin-clavulanate (AUGMENTIN) 875-125 MG tablet Take 1 tablet by mouth 2 (two) times daily. 20 tablet 0  . clopidogrel (PLAVIX) 75 MG tablet Take 1 tablet by mouth  every day 90 tablet 3  . doxycycline (VIBRAMYCIN) 100 MG capsule     . Lancet Devices (ONE TOUCH DELICA LANCING DEV) MISC Use twice daily Dx: 250.00 100 each 5  . metFORMIN (GLUCOPHAGE) 500 MG tablet Take 1 tablet by mouth  twice a day as directed 180 tablet 1  . Multiple Vitamin (MULTIVITAMIN) tablet Take 1 tablet by mouth daily.    .  mupirocin ointment (BACTROBAN) 2 % Place 1 application into the nose 2 (two) times daily. 22 g 0  . ONE TOUCH ULTRA TEST test strip TEST BG TWICE DAILY 100 each 3  . timolol (BETIMOL) 0.5 % ophthalmic solution 1 drop 2 (two) times daily.    . timolol (TIMOPTIC) 0.5 % ophthalmic solution      No current facility-administered medications on file prior to visit.   Allergies  Allergen Reactions  . Actos [Pioglitazone] Swelling  . Contrast Media [Iodinated Diagnostic Agents] Swelling  . Prandin [Repaglinide] Swelling  . Pravastatin Sodium     cramps    Recent Results (from the past 2160 hour(s))  CBC with Differential/Platelet     Status: None   Collection Time: 07/26/15  9:34 AM  Result Value Ref Range   WBC 6.8 4.0 - 10.5 K/uL   RBC 4.67 3.87 - 5.11 Mil/uL   Hemoglobin 14.5 12.0 - 15.0 g/dL   HCT 43.8 36.0 - 46.0 %   MCV 93.6 78.0 - 100.0 fl   MCHC 33.1 30.0 - 36.0 g/dL   RDW 12.7 11.5 - 15.5 %   Platelets 153.0 150.0 - 400.0 K/uL   Neutrophils Relative % 58.6 43.0 - 77.0 %   Lymphocytes Relative 29.2 12.0 - 46.0 %   Monocytes Relative 9.3 3.0 - 12.0 %   Eosinophils Relative 2.4 0.0 - 5.0 %   Basophils Relative 0.5 0.0 - 3.0 %   Neutro Abs 4.0 1.4 - 7.7 K/uL   Lymphs Abs 2.0 0.7 -  4.0 K/uL   Monocytes Absolute 0.6 0.1 - 1.0 K/uL   Eosinophils Absolute 0.2 0.0 - 0.7 K/uL   Basophils Absolute 0.0 0.0 - 0.1 K/uL  Lipid panel     Status: Abnormal   Collection Time: 07/26/15  9:34 AM  Result Value Ref Range   Cholesterol 181 0 - 200 mg/dL    Comment: ATP III Classification       Desirable:  < 200 mg/dL               Borderline High:  200 - 239 mg/dL          High:  > = 240 mg/dL   Triglycerides 144.0 0.0 - 149.0 mg/dL    Comment: Normal:  <150 mg/dLBorderline High:  150 - 199 mg/dL   HDL 46.50 >39.00 mg/dL   VLDL 28.8 0.0 - 40.0 mg/dL   LDL Cholesterol 105 (H) 0 - 99 mg/dL   Total CHOL/HDL Ratio 4     Comment:                Men          Women1/2 Average Risk     3.4           3.3Average Risk          5.0          4.42X Average Risk          9.6          7.13X Average Risk          15.0          11.0                       NonHDL 134.05     Comment: NOTE:  Non-HDL goal should be 30 mg/dL higher than patient's LDL goal (i.e. LDL goal of < 70 mg/dL, would have non-HDL goal of < 100 mg/dL)  Hepatic function panel     Status: None   Collection Time: 07/26/15  9:34 AM  Result Value Ref Range   Total Bilirubin 0.9 0.2 - 1.2 mg/dL   Bilirubin, Direct 0.2 0.0 - 0.3 mg/dL   Alkaline Phosphatase 68 39 - 117 U/L   AST 17 0 - 37 U/L   ALT 11 0 - 35 U/L   Total Protein 7.2 6.0 - 8.3 g/dL   Albumin 4.2 3.5 - 5.2 g/dL  Hemoglobin A1c     Status: Abnormal   Collection Time: 07/26/15  9:34 AM  Result Value Ref Range   Hgb A1c MFr Bld 6.6 (H) 4.6 - 6.5 %    Comment: Glycemic Control Guidelines for People with Diabetes:Non Diabetic:  <6%Goal of Therapy: <7%Additional Action Suggested:  >1%   Basic metabolic panel     Status: Abnormal   Collection Time: 07/26/15  9:34 AM  Result Value Ref Range   Sodium 140 135 - 145 mEq/L   Potassium 4.4 3.5 - 5.1 mEq/L   Chloride 102 96 - 112 mEq/L   CO2 30 19 - 32 mEq/L   Glucose, Bld 150 (H) 70 - 99 mg/dL   BUN 15 6 - 23 mg/dL   Creatinine, Ser 0.59 0.40 - 1.20 mg/dL   Calcium 9.7 8.4 - 10.5 mg/dL   GFR 101.24 >60.00 mL/min    Objective: There were no vitals filed for this visit.  General: Patient is awake, alert, oriented x 3 and in no acute distress.  Dermatology: Skin  is warm and dry bilateral with a now partial thickness ulceration present  Sub met 1 left foot. Ulceration measures 1cmx 0.2cmx 0.1cm (last measurement 2 cm x 1 cm x 0.5cm) post debridement. There is a Keratotic border with subdermal heme with a granular base. The ulceration does not  probe to bone. There is no malodor, no active drainage, no erythema, no edema. No acute signs of infection. Nails are short and thickened, currently asymptomatic.    Vascular:  Dorsalis Pedis pulse = 1/4 Bilateral,  Posterior Tibial pulse = 1/4 Bilateral,  Capillary Fill Time < 5 seconds  Neurologic: Epicritic sensation absent to the level of ankle using the 5.07/10g Semmes Weinstein Monofilament bilateral.  Musculosketal: Asymptomatic L>R bunion and hammertoes. No Pain with palpation to ulcerated area on left foot. No pain with compression to calves bilateral.  Assessment and Plan:  Problem List Items Addressed This Visit    None    Visit Diagnoses    Foot ulcer, left, limited to breakdown of skin ([place])    -  Primary    sub met 1     Diabetic polyneuropathy associated with type 2 diabetes mellitus (HCC)        Foot pain, unspecified laterality          -Examined patient and discussed the progression of the wound and treatment alternatives. - Excisionally dedbrided ulceration to healthy bleeding borders using a sterile chisel  blade. -Applied dry sterile dressing and instructed patient to continue with daily dressings at home consisting of Dermawound and dry sterile dressing with offloading/extra 4x4 padding once daily. Ok to leave open to air a few hours before redressing. -Cont with offloading wound healing shoe -Awaiting biologic to assist with wound closure; Affinity fresh allograft requested; Advised son that if not completely closed at next visit will proceed with graft request. -Son informed me as well that patient is planning to go to a wound care specialist in [place]; I advised son that his mother's wound is >50% improved since last visit and that the wound is making progress. May get another opinion if they prefer but I don't see an immediate need to do so. - Advised patient to go to the ER or return to office if the wound worsens or if constitutional symptoms are present. -Patient to return to office in 2 weeks for follow up care/ulcer check or sooner if problems arise.  Landis Martins, DPM

## 2015-10-29 ENCOUNTER — Encounter: Payer: Self-pay | Admitting: Sports Medicine

## 2015-10-29 ENCOUNTER — Ambulatory Visit (INDEPENDENT_AMBULATORY_CARE_PROVIDER_SITE_OTHER): Payer: Medicare Other | Admitting: Sports Medicine

## 2015-10-29 VITALS — BP 159/80 | HR 75 | Resp 16

## 2015-10-29 DIAGNOSIS — L97521 Non-pressure chronic ulcer of other part of left foot limited to breakdown of skin: Secondary | ICD-10-CM

## 2015-10-29 DIAGNOSIS — M79673 Pain in unspecified foot: Secondary | ICD-10-CM

## 2015-10-29 DIAGNOSIS — L89891 Pressure ulcer of other site, stage 1: Secondary | ICD-10-CM

## 2015-10-29 DIAGNOSIS — E1142 Type 2 diabetes mellitus with diabetic polyneuropathy: Secondary | ICD-10-CM

## 2015-10-29 NOTE — Progress Notes (Signed)
Patient ID: Angela Patterson, female   DOB: 03-09-1921, 82 y.o.   MRN: [id number]  Subjective: Angela Patterson is a 82 y.o. female patient seen in office for follow up evaluation of ulceration sub met 1 on left foot; Patient has a history of diabetes and a blood glucose level today of 132 mg/dl.  Patient is changing the dressing using Dermawound currently at home/ with help of daughter. Denies nausea/fever/vomiting/chills/night sweats/shortness of breath/pain. Patient has no other pedal complaints at this time.  Patient is assisted by son and daughter at this visit.   Patient Active Problem List   Diagnosis Date Noted  . Open wound of foot excluding toes without complication 73/41/9379  . Health care maintenance 10/27/2014  . Weight loss 12/27/2013  . Thrombocytopenia ([place]) 11/20/2012  . Hypertension 07/21/2012  . Hypercholesterolemia 07/21/2012  . History of CVA (cerebrovascular accident) 07/21/2012  . Diabetes mellitus ([place]) 07/21/2012  . Cerebral artery occlusion with cerebral infarction ([place]) 07/21/2012  . Essential (primary) hypertension 07/21/2012  . Pure hypercholesterolemia 07/21/2012  . Type 2 diabetes mellitus ([place]) 07/21/2012   Current Outpatient Prescriptions on File Prior to Visit  Medication Sig Dispense Refill  . amLODipine (NORVASC) 5 MG tablet Take 1 tablet by mouth two  times daily 180 tablet 1  . amoxicillin-clavulanate (AUGMENTIN) 875-125 MG tablet Take 1 tablet by mouth 2 (two) times daily. 20 tablet 0  . clopidogrel (PLAVIX) 75 MG tablet Take 1 tablet by mouth  every day 90 tablet 3  . doxycycline (VIBRAMYCIN) 100 MG capsule     . Lancet Devices (ONE TOUCH DELICA LANCING DEV) MISC Use twice daily Dx: 250.00 100 each 5  . metFORMIN (GLUCOPHAGE) 500 MG tablet Take 1 tablet by mouth  twice a day as directed 180 tablet 1  . Multiple Vitamin (MULTIVITAMIN) tablet Take 1 tablet by mouth daily.    . mupirocin ointment (BACTROBAN) 2 % Place 1 application into the nose 2 (two)  times daily. 22 g 0  . ONE TOUCH ULTRA TEST test strip TEST BG TWICE DAILY 100 each 3  . timolol (BETIMOL) 0.5 % ophthalmic solution 1 drop 2 (two) times daily.    . timolol (TIMOPTIC) 0.5 % ophthalmic solution      No current facility-administered medications on file prior to visit.   Allergies  Allergen Reactions  . Actos [Pioglitazone] Swelling  . Contrast Media [Iodinated Diagnostic Agents] Swelling  . Prandin [Repaglinide] Swelling  . Pravastatin Sodium     cramps    No results found for this or any previous visit (from the past 2160 hour(s)).  Objective: There were no vitals filed for this visit.  General: Patient is awake, alert, oriented x 3 and in no acute distress.  Dermatology: Skin is warm and dry bilateral with a now partial thickness ulceration present  Sub met 1 left foot. Ulceration measures 0.8x.05x0.1cm (last measurement 1cmx 0.2cmx 0.1cm) post debridement. There is a Keratotic border with subdermal heme with a granular base. The ulceration does not probe to bone. There is no malodor, no active drainage, no erythema, no edema. No acute signs of infection. Nails are short and thickened, currently asymptomatic.    Vascular: Dorsalis Pedis pulse = 1/4 Bilateral,  Posterior Tibial pulse = 1/4 Bilateral,  Capillary Fill Time < 5 seconds  Neurologic: Epicritic sensation absent to the level of ankle using the 5.07/10g Semmes Weinstein Monofilament bilateral.  Musculosketal: Asymptomatic L>R bunion and hammertoes. No Pain with palpation to ulcerated area on left foot. No  pain with compression to calves bilateral.  Assessment and Plan:  Problem List Items Addressed This Visit    None    Visit Diagnoses    Foot ulcer, left, limited to breakdown of skin ([place])    -  Primary    Diabetic polyneuropathy associated with type 2 diabetes mellitus ([place])        Foot pain, unspecified laterality          -Examined patient and discussed the progression of the wound and  treatment alternatives. -Excisionally dedbrided ulceration to healthy bleeding borders using a sterile chisel  blade. -Applied dry sterile dressing and instructed patient to continue with daily dressings at home consisting of Dermawound and dry sterile dressing with offloading/extra 4x4 padding once daily. Ok to leave open to air a few hours before redressing. -Cont with offloading wound healing shoe once healed may consider revising her plastizote inserts -Awaiting biologic to assist with wound closure; Affinity fresh allograft requested - Advised patient to go to the ER or return to office if the wound worsens or if constitutional symptoms are present. -Patient to return to office in 3 weeks for follow up care/ulcer check or sooner if problems arise.  Angela Patterson, DPM

## 2015-11-08 ENCOUNTER — Ambulatory Visit: Payer: Medicare Other | Admitting: Sports Medicine

## 2015-11-12 ENCOUNTER — Encounter: Payer: Self-pay | Admitting: Internal Medicine

## 2015-11-12 ENCOUNTER — Ambulatory Visit (INDEPENDENT_AMBULATORY_CARE_PROVIDER_SITE_OTHER): Payer: Medicare Other | Admitting: Internal Medicine

## 2015-11-12 VITALS — BP 176/74 | HR 67 | Temp 98.1°F | Ht 65.0 in | Wt 103.6 lb

## 2015-11-12 DIAGNOSIS — I10 Essential (primary) hypertension: Secondary | ICD-10-CM | POA: Diagnosis not present

## 2015-11-12 DIAGNOSIS — R634 Abnormal weight loss: Secondary | ICD-10-CM | POA: Diagnosis not present

## 2015-11-12 DIAGNOSIS — E11621 Type 2 diabetes mellitus with foot ulcer: Secondary | ICD-10-CM | POA: Diagnosis not present

## 2015-11-12 DIAGNOSIS — E7800 Pure hypercholesterolemia, unspecified: Secondary | ICD-10-CM

## 2015-11-12 DIAGNOSIS — S91309D Unspecified open wound, unspecified foot, subsequent encounter: Secondary | ICD-10-CM

## 2015-11-12 DIAGNOSIS — D696 Thrombocytopenia, unspecified: Secondary | ICD-10-CM | POA: Diagnosis not present

## 2015-11-12 DIAGNOSIS — L97509 Non-pressure chronic ulcer of other part of unspecified foot with unspecified severity: Secondary | ICD-10-CM

## 2015-11-12 NOTE — Progress Notes (Signed)
Patient ID: Angela Patterson, female   DOB: [birth year], 82 y.o.   MRN: [id number]   Subjective:    Patient ID: Angela Patterson, female    DOB: 09/29/1920, 82 y.o.   MRN: [id number]  HPI  Patient here for a scheduled follow up.  States she is doing well.  Being followed for her foot wound.  Seeing podiatry.  See note.  Brought in sugar readings.  Am sugars averaging 120-140 and pm sugars averaging 150-200.  Occasionally will have higher readings.  States she is eating.  No chest pain or tightness.  No sob.  No acid reflux.  No abdominal pain or cramping.  Bowels stable.    Past Medical History  Diagnosis Date  . Hypertension   . Hypercholesterolemia   . Diabetes mellitus ([place])   . CVA (cerebral vascular accident) ([place])   . Chicken pox   . Allergy    Past Surgical History  Procedure Laterality Date  . Appendectomy  age 48  . Dilation and curettage of uterus  1970  . Abdominal hysterectomy  1971    excessive bleeding  . Breast lumpectomy  1958    benign   Family History  Problem Relation Age of Onset  . Liver cancer Father   . Stroke Mother   . Breast cancer Neg Hx   . Colon cancer Neg Hx    Social History   Social History  . Marital Status: Widowed    Spouse Name: N/A  . Number of Children: 3  . Years of Education: N/A   Social History Main Topics  . Smoking status: Never Smoker   . Smokeless tobacco: Never Used  . Alcohol Use: No  . Drug Use: No  . Sexual Activity: Not Asked   Other Topics Concern  . None   Social History Narrative    Outpatient Encounter Prescriptions as of 11/12/2015  Medication Sig  . amLODipine (NORVASC) 5 MG tablet Take 1 tablet by mouth two  times daily  . clopidogrel (PLAVIX) 75 MG tablet Take 1 tablet by mouth  every day  . doxycycline (VIBRAMYCIN) 100 MG capsule   . Lancet Devices (ONE TOUCH DELICA LANCING DEV) MISC Use twice daily Dx: 250.00  . metFORMIN (GLUCOPHAGE) 500 MG tablet Take 1 tablet by mouth  twice a day as directed  .  Multiple Vitamin (MULTIVITAMIN) tablet Take 1 tablet by mouth daily.  . mupirocin ointment (BACTROBAN) 2 % Place 1 application into the nose 2 (two) times daily.  . ONE TOUCH ULTRA TEST test strip TEST BG TWICE DAILY  . timolol (BETIMOL) 0.5 % ophthalmic solution 1 drop 2 (two) times daily.  . timolol (TIMOPTIC) 0.5 % ophthalmic solution   . [DISCONTINUED] amoxicillin-clavulanate (AUGMENTIN) 875-125 MG tablet Take 1 tablet by mouth 2 (two) times daily.   No facility-administered encounter medications on file as of 11/12/2015.    Review of Systems  Constitutional: Negative for appetite change.       States eating well.  Weight down some.    HENT: Negative for congestion and sinus pressure.   Respiratory: Negative for cough, chest tightness and shortness of breath.   Cardiovascular: Negative for chest pain, palpitations and leg swelling.  Gastrointestinal: Negative for nausea, vomiting, abdominal pain and diarrhea.  Genitourinary: Negative for dysuria and difficulty urinating.  Musculoskeletal: Negative for back pain and joint swelling.  Skin: Negative for color change and rash.  Neurological: Negative for dizziness, light-headedness and headaches.  Psychiatric/Behavioral: Negative for dysphoric mood and  agitation.       Objective:     Blood pressure rechecked by me:  144/78  Physical Exam  Constitutional: She appears well-developed and well-nourished. No distress.  HENT:  Nose: Nose normal.  Mouth/Throat: Oropharynx is clear and moist.  Neck: Neck supple. No thyromegaly present.  Cardiovascular: Normal rate and regular rhythm.   Pulmonary/Chest: Breath sounds normal. No respiratory distress. She has no wheezes.  Abdominal: Soft. Bowel sounds are normal. There is no tenderness.  Musculoskeletal: She exhibits no edema or tenderness.  Lymphadenopathy:    She has no cervical adenopathy.  Skin:  Persistent foot wound.  Followed by podiatry.   Psychiatric: She has a normal mood and  affect. Her behavior is normal.    BP 176/74 mmHg  Pulse 67  Temp(Src) 98.1 F (36.7 C) (Oral)  Ht 5' 5"  (1.651 m)  Wt 103 lb 9.6 oz (46.993 kg)  BMI 17.24 kg/m2  SpO2 96%  LMP 07/21/1966 Wt Readings from Last 3 Encounters:  11/12/15 103 lb 9.6 oz (46.993 kg)  07/11/15 105 lb (47.628 kg)  02/22/15 106 lb 8 oz (48.308 kg)     Lab Results  Component Value Date   WBC 6.8 07/26/2015   HGB 14.5 07/26/2015   HCT 43.8 07/26/2015   PLT 153.0 07/26/2015   GLUCOSE 150* 07/26/2015   CHOL 181 07/26/2015   TRIG 144.0 07/26/2015   HDL 46.50 07/26/2015   LDLDIRECT 66.4 05/24/2013   LDLCALC 105* 07/26/2015   ALT 11 07/26/2015   AST 17 07/26/2015   NA 140 07/26/2015   K 4.4 07/26/2015   CL 102 07/26/2015   CREATININE 0.59 07/26/2015   BUN 15 07/26/2015   CO2 30 07/26/2015   TSH 4.11 10/19/2014   HGBA1C 6.6* 07/26/2015   MICROALBUR 1.4 10/19/2014    Dg Foot Complete Left  07/30/2015  CLINICAL DATA:  Nonhealing wound. EXAM: LEFT FOOT - COMPLETE 3+ VIEW COMPARISON:  MRI 10/09/2014. FINDINGS: Diffuse osteopenia and degenerative change. No acute bony abnormality identified. A a wound with a bandage is noted over the plantar aspect of the distal foot. No adjacent acute bony abnormality. If osteomyelitis is of concern MRI can be obtained. IMPRESSION: Soft tissue wound with a bandage is noted over the plantar aspect of the distal left foot. Diffuse osteopenia degenerative change. No acute bony abnormality. Electronically Signed   By: Marcello Moores  Register   On: 07/30/2015 15:46       Assessment & Plan:   Problem List Items Addressed This Visit    Hypertension - Primary    Blood pressure on recheck improved.  Has been under good control.  Follow pressures.  Follow metabolic panel.        Open wound of foot excluding toes without complication    Seeing podiatry.  See notes.       Pure hypercholesterolemia    Low cholesterol diet and exercise.  Follow lipid panel.        Relevant  Orders   Lipid panel   Hepatic function panel   Thrombocytopenia (HCC)    Follow cbc.       Type 2 diabetes mellitus (HCC)    Sugars as outlined.  Hold on making changes.  Follow met b and a1c.        Relevant Orders   Hemoglobin H6W   Basic metabolic panel   Microalbumin / creatinine urine ratio   Weight loss    Weight back down a few pounds.  States she is eating  well.  Follow.        Relevant Orders   TSH       Einar Pheasant, MD

## 2015-11-12 NOTE — Progress Notes (Signed)
Pre visit review using our clinic review tool, if applicable. No additional management support is needed unless otherwise documented below in the visit note. 

## 2015-11-18 ENCOUNTER — Encounter: Payer: Self-pay | Admitting: Sports Medicine

## 2015-11-18 ENCOUNTER — Encounter: Payer: Self-pay | Admitting: Internal Medicine

## 2015-11-18 NOTE — Assessment & Plan Note (Signed)
Blood pressure on recheck improved.  Has been under good control.  Follow pressures.  Follow metabolic panel.

## 2015-11-18 NOTE — Assessment & Plan Note (Signed)
Weight back down a few pounds.  States she is eating well.  Follow.

## 2015-11-18 NOTE — Assessment & Plan Note (Signed)
Low cholesterol diet and exercise.  Follow lipid panel.   

## 2015-11-18 NOTE — Assessment & Plan Note (Signed)
Follow cbc.  

## 2015-11-18 NOTE — Assessment & Plan Note (Signed)
Sugars as outlined.  Hold on making changes.  Follow met b and a1c.   

## 2015-11-18 NOTE — Assessment & Plan Note (Signed)
Seeing podiatry.  See notes.

## 2015-11-19 ENCOUNTER — Telehealth: Payer: Self-pay | Admitting: Internal Medicine

## 2015-11-19 ENCOUNTER — Telehealth: Payer: Self-pay | Admitting: *Deleted

## 2015-11-19 NOTE — Telephone Encounter (Addendum)
[facility] supply request sent for 1" paper tape, and sterile saline for wound dressings.  Caryl Pina - [facility] asked if there was a billable medication to go with the paper tape and sterile saline order, if not [facility] would offer pt a discounted price for the paper tape and through in the complimentary sterile saline.  I told her there was no other medication orders at this time.

## 2015-11-19 NOTE — Telephone Encounter (Signed)
Pt granddaughter called wanting to know can her lab appt be resch closer to her up coming appt? Call [facility] @ [phone]. Thank you!

## 2015-11-19 NOTE — Telephone Encounter (Signed)
Spoke with the granddaughter and changed appt time. Thanks

## 2015-11-26 ENCOUNTER — Other Ambulatory Visit (INDEPENDENT_AMBULATORY_CARE_PROVIDER_SITE_OTHER): Payer: Medicare Other

## 2015-11-26 ENCOUNTER — Ambulatory Visit (INDEPENDENT_AMBULATORY_CARE_PROVIDER_SITE_OTHER): Payer: Medicare Other | Admitting: Sports Medicine

## 2015-11-26 ENCOUNTER — Other Ambulatory Visit: Payer: Medicare Other

## 2015-11-26 ENCOUNTER — Encounter: Payer: Self-pay | Admitting: Sports Medicine

## 2015-11-26 DIAGNOSIS — L89891 Pressure ulcer of other site, stage 1: Secondary | ICD-10-CM | POA: Diagnosis not present

## 2015-11-26 DIAGNOSIS — E7800 Pure hypercholesterolemia, unspecified: Secondary | ICD-10-CM

## 2015-11-26 DIAGNOSIS — L97509 Non-pressure chronic ulcer of other part of unspecified foot with unspecified severity: Secondary | ICD-10-CM | POA: Diagnosis not present

## 2015-11-26 DIAGNOSIS — E11621 Type 2 diabetes mellitus with foot ulcer: Secondary | ICD-10-CM | POA: Diagnosis not present

## 2015-11-26 DIAGNOSIS — E1142 Type 2 diabetes mellitus with diabetic polyneuropathy: Secondary | ICD-10-CM

## 2015-11-26 DIAGNOSIS — R634 Abnormal weight loss: Secondary | ICD-10-CM | POA: Diagnosis not present

## 2015-11-26 DIAGNOSIS — L97521 Non-pressure chronic ulcer of other part of left foot limited to breakdown of skin: Secondary | ICD-10-CM

## 2015-11-26 DIAGNOSIS — M79673 Pain in unspecified foot: Secondary | ICD-10-CM

## 2015-11-26 LAB — TSH: TSH: 3.46 u[IU]/mL (ref 0.35–4.50)

## 2015-11-26 LAB — BASIC METABOLIC PANEL
BUN: 16 mg/dL (ref 6–23)
CO2: 30 mEq/L (ref 19–32)
Calcium: 10.2 mg/dL (ref 8.4–10.5)
Chloride: 101 mEq/L (ref 96–112)
Creatinine, Ser: 0.59 mg/dL (ref 0.40–1.20)
GFR: 101.16 mL/min (ref >60.00)
Glucose, Bld: 149 mg/dL — ABNORMAL HIGH (ref 70–99)
Potassium: 3.7 mEq/L (ref 3.5–5.1)
Sodium: 142 mEq/L (ref 135–145)

## 2015-11-26 LAB — LIPID PANEL
Cholesterol: 207 mg/dL — ABNORMAL HIGH (ref 0–200)
HDL: 57 mg/dL (ref >39.00)
LDL Cholesterol: 117 mg/dL — ABNORMAL HIGH (ref 0–99)
NonHDL: 150.32
Total CHOL/HDL Ratio: 4
Triglycerides: 166 mg/dL — ABNORMAL HIGH (ref 0.0–149.0)
VLDL: 33.2 mg/dL (ref 0.0–40.0)

## 2015-11-26 LAB — HEPATIC FUNCTION PANEL
ALT: 12 U/L (ref 0–35)
AST: 18 U/L (ref 0–37)
Albumin: 4.9 g/dL (ref 3.5–5.2)
Alkaline Phosphatase: 58 U/L (ref 39–117)
Bilirubin, Direct: 0.2 mg/dL (ref 0.0–0.3)
Total Bilirubin: 1.2 mg/dL (ref 0.2–1.2)
Total Protein: 8.2 g/dL (ref 6.0–8.3)

## 2015-11-26 LAB — MICROALBUMIN / CREATININE URINE RATIO
Creatinine,U: 75.6 mg/dL
Microalb Creat Ratio: 11.4 mg/g (ref 0.0–30.0)
Microalb, Ur: 8.6 mg/dL — ABNORMAL HIGH (ref 0.0–1.9)

## 2015-11-26 LAB — HEMOGLOBIN A1C: Hgb A1c MFr Bld: 6.8 % — ABNORMAL HIGH (ref 4.6–6.5)

## 2015-11-26 NOTE — Progress Notes (Signed)
Patient ID: Angela Patterson, female   DOB: [birth year], 82 y.o.   MRN: [id number] Subjective: Angela Patterson is a 82 y.o. female patient seen in office for follow up evaluation of ulceration sub met 1 on left foot; Patient has a history of diabetes and a blood glucose level today not recorded. Patient is changing the dressing using Dermawound currently at home/ with help of daughter. Denies nausea/fever/vomiting/chills/night sweats/shortness of breath/pain. Patient has no other pedal complaints at this time.  Patient is assisted by daughter at this visit.   Patient Active Problem List   Diagnosis Date Noted  . Open wound of foot excluding toes without complication 20/25/4270  . Health care maintenance 10/27/2014  . Weight loss 12/27/2013  . Thrombocytopenia ([place]) 11/20/2012  . Hypertension 07/21/2012  . Hypercholesterolemia 07/21/2012  . History of CVA (cerebrovascular accident) 07/21/2012  . Diabetes mellitus ([place]) 07/21/2012  . Cerebral artery occlusion with cerebral infarction ([place]) 07/21/2012  . Essential (primary) hypertension 07/21/2012  . Pure hypercholesterolemia 07/21/2012  . Type 2 diabetes mellitus ([place]) 07/21/2012   Current Outpatient Prescriptions on File Prior to Visit  Medication Sig Dispense Refill  . amLODipine (NORVASC) 5 MG tablet Take 1 tablet by mouth two  times daily 180 tablet 1  . clopidogrel (PLAVIX) 75 MG tablet Take 1 tablet by mouth  every day 90 tablet 3  . doxycycline (VIBRAMYCIN) 100 MG capsule     . Lancet Devices (ONE TOUCH DELICA LANCING DEV) MISC Use twice daily Dx: 250.00 100 each 5  . metFORMIN (GLUCOPHAGE) 500 MG tablet Take 1 tablet by mouth  twice a day as directed 180 tablet 1  . Multiple Vitamin (MULTIVITAMIN) tablet Take 1 tablet by mouth daily.    . mupirocin ointment (BACTROBAN) 2 % Place 1 application into the nose 2 (two) times daily. 22 g 0  . ONE TOUCH ULTRA TEST test strip TEST BG TWICE DAILY 100 each 3  . timolol (BETIMOL) 0.5 % ophthalmic  solution 1 drop 2 (two) times daily.    . timolol (TIMOPTIC) 0.5 % ophthalmic solution      No current facility-administered medications on file prior to visit.   Allergies  Allergen Reactions  . Actos [Pioglitazone] Swelling  . Contrast Media [Iodinated Diagnostic Agents] Swelling  . Prandin [Repaglinide] Swelling  . Pravastatin Sodium     cramps    Recent Results (from the past 2160 hour(s))  Lipid panel     Status: Abnormal   Collection Time: 11/26/15  8:11 AM  Result Value Ref Range   Cholesterol 207 (H) 0 - 200 mg/dL    Comment: ATP III Classification       Desirable:  < 200 mg/dL               Borderline High:  200 - 239 mg/dL          High:  > = 240 mg/dL   Triglycerides 166.0 (H) 0.0 - 149.0 mg/dL    Comment: Normal:  <150 mg/dLBorderline High:  150 - 199 mg/dL   HDL 57.00 >39.00 mg/dL   VLDL 33.2 0.0 - 40.0 mg/dL   LDL Cholesterol 117 (H) 0 - 99 mg/dL   Total CHOL/HDL Ratio 4     Comment:                Men          Women1/2 Average Risk     3.4          3.3Average Risk  5.0          4.42X Average Risk          9.6          7.13X Average Risk          15.0          11.0                       NonHDL 150.32     Comment: NOTE:  Non-HDL goal should be 30 mg/dL higher than patient's LDL goal (i.e. LDL goal of < 70 mg/dL, would have non-HDL goal of < 100 mg/dL)  Hepatic function panel     Status: None   Collection Time: 11/26/15  8:11 AM  Result Value Ref Range   Total Bilirubin 1.2 0.2 - 1.2 mg/dL   Bilirubin, Direct 0.2 0.0 - 0.3 mg/dL   Alkaline Phosphatase 58 39 - 117 U/L   AST 18 0 - 37 U/L   ALT 12 0 - 35 U/L   Total Protein 8.2 6.0 - 8.3 g/dL   Albumin 4.9 3.5 - 5.2 g/dL  TSH     Status: None   Collection Time: 11/26/15  8:11 AM  Result Value Ref Range   TSH 3.46 0.35 - 4.50 uIU/mL  Hemoglobin A1c     Status: Abnormal   Collection Time: 11/26/15  8:11 AM  Result Value Ref Range   Hgb A1c MFr Bld 6.8 (H) 4.6 - 6.5 %    Comment: Glycemic Control  Guidelines for People with Diabetes:Non Diabetic:  <6%Goal of Therapy: <7%Additional Action Suggested:  >2%   Basic metabolic panel     Status: Abnormal   Collection Time: 11/26/15  8:11 AM  Result Value Ref Range   Sodium 142 135 - 145 mEq/L   Potassium 3.7 3.5 - 5.1 mEq/L   Chloride 101 96 - 112 mEq/L   CO2 30 19 - 32 mEq/L   Glucose, Bld 149 (H) 70 - 99 mg/dL   BUN 16 6 - 23 mg/dL   Creatinine, Ser 0.59 0.40 - 1.20 mg/dL   Calcium 10.2 8.4 - 10.5 mg/dL   GFR 101.16 >60.00 mL/min  Microalbumin / creatinine urine ratio     Status: Abnormal   Collection Time: 11/26/15  8:11 AM  Result Value Ref Range   Microalb, Ur 8.6 (H) 0.0 - 1.9 mg/dL   Creatinine,U 75.6 mg/dL   Microalb Creat Ratio 11.4 0.0 - 30.0 mg/g    Objective: There were no vitals filed for this visit.  General: Patient is awake, alert, oriented x 3 and in no acute distress.  Dermatology: Skin is warm and dry bilateral with a now partial thickness ulceration present  Sub met 1 left foot. Ulceration measures 0.4x.0.2x0.1cm (last measurement 0.8cmx 0.5cmx 0.1cm) post debridement. There is a Keratotic border with subdermal heme with a granular base. The ulceration does not probe to bone. There is no malodor, no active drainage, no erythema, no edema. No acute signs of infection. Nails are short and thickened, currently asymptomatic.    Vascular: Dorsalis Pedis pulse = 1/4 Bilateral,  Posterior Tibial pulse = 1/4 Bilateral,  Capillary Fill Time < 5 seconds  Neurologic: Epicritic sensation absent to the level of ankle using the 5.07/10g Semmes Weinstein Monofilament bilateral.  Musculosketal: Asymptomatic L>R bunion and hammertoes. No Pain with palpation to ulcerated area on left foot. No pain with compression to calves bilateral.  Assessment and Plan:  Problem List Items Addressed  This Visit    None    Visit Diagnoses    Foot ulcer, left, limited to breakdown of skin ([place])    -  Primary    Diabetic polyneuropathy  associated with type 2 diabetes mellitus (HCC)        Foot pain, unspecified laterality          -Examined patient and discussed the progression of the wound and treatment alternatives. -Excisionally dedbrided ulceration to healthy bleeding borders using a sterile chisel  blade. -Applied dry sterile dressing and instructed patient to continue with daily dressings at home consisting of Dermawound and dry sterile dressing with offloading/extra 4x4 padding once daily. To reorder supplies as needed from [facility] -Cont with offloading wound healing shoe once healed may consider revising her plastizote inserts -Will hold off on Affinity fresh allograft at this time since patient is making progress with current wound care plans -Encouraged patient's daughter to consider [facility] home care - Advised patient to go to the ER or return to office if the wound worsens or if constitutional symptoms are present. -Patient to return to office in 3 weeks for follow up care/ulcer check or sooner if problems arise.  Landis Martins, DPM

## 2015-11-27 ENCOUNTER — Encounter: Payer: Self-pay | Admitting: Internal Medicine

## 2015-12-03 ENCOUNTER — Other Ambulatory Visit: Payer: Medicare Other

## 2015-12-03 DIAGNOSIS — H401132 Primary open-angle glaucoma, bilateral, moderate stage: Secondary | ICD-10-CM | POA: Diagnosis not present

## 2015-12-09 DIAGNOSIS — H401132 Primary open-angle glaucoma, bilateral, moderate stage: Secondary | ICD-10-CM | POA: Diagnosis not present

## 2015-12-09 LAB — HM DIABETES EYE EXAM

## 2015-12-12 ENCOUNTER — Encounter: Payer: Self-pay | Admitting: Internal Medicine

## 2015-12-17 ENCOUNTER — Ambulatory Visit (INDEPENDENT_AMBULATORY_CARE_PROVIDER_SITE_OTHER): Payer: Medicare Other | Admitting: Sports Medicine

## 2015-12-17 ENCOUNTER — Encounter: Payer: Self-pay | Admitting: Sports Medicine

## 2015-12-17 ENCOUNTER — Other Ambulatory Visit: Payer: Self-pay | Admitting: Internal Medicine

## 2015-12-17 VITALS — BP 160/82 | HR 70 | Resp 16

## 2015-12-17 DIAGNOSIS — L89891 Pressure ulcer of other site, stage 1: Secondary | ICD-10-CM | POA: Diagnosis not present

## 2015-12-17 DIAGNOSIS — E1142 Type 2 diabetes mellitus with diabetic polyneuropathy: Secondary | ICD-10-CM

## 2015-12-17 DIAGNOSIS — L97521 Non-pressure chronic ulcer of other part of left foot limited to breakdown of skin: Secondary | ICD-10-CM

## 2015-12-17 NOTE — Progress Notes (Signed)
Patient ID: Angela Patterson, female   DOB: 14-Sep-1920, 82 y.o.   MRN: [id number] Subjective: Angela Patterson is a 82 y.o. female patient seen in office for follow up evaluation of ulceration sub met 1 on left foot; Patient has a history of diabetes and a blood glucose level today not recorded. Patient is changing the dressing using Dermawound currently at home/ with help of daughter. Denies nausea/fever/vomiting/chills/night sweats/shortness of breath/pain. Patient has no other pedal complaints at this time.  Patient is assisted by daughter at this visit.   Patient Active Problem List   Diagnosis Date Noted  . Open wound of foot excluding toes without complication 20/94/7096  . Health care maintenance 10/27/2014  . Weight loss 12/27/2013  . Thrombocytopenia ([place]) 11/20/2012  . Hypertension 07/21/2012  . Hypercholesterolemia 07/21/2012  . History of CVA (cerebrovascular accident) 07/21/2012  . Diabetes mellitus ([place]) 07/21/2012  . Cerebral artery occlusion with cerebral infarction ([place]) 07/21/2012  . Essential (primary) hypertension 07/21/2012  . Pure hypercholesterolemia 07/21/2012  . Type 2 diabetes mellitus ([place]) 07/21/2012   Current Outpatient Prescriptions on File Prior to Visit  Medication Sig Dispense Refill  . amLODipine (NORVASC) 5 MG tablet Take 1 tablet by mouth two  times daily 180 tablet 1  . clopidogrel (PLAVIX) 75 MG tablet Take 1 tablet by mouth  every day 90 tablet 3  . Lancet Devices (ONE TOUCH DELICA LANCING DEV) MISC Use twice daily Dx: 250.00 100 each 5  . metFORMIN (GLUCOPHAGE) 500 MG tablet Take 1 tablet by mouth  twice a day as directed 180 tablet 1  . Multiple Vitamin (MULTIVITAMIN) tablet Take 1 tablet by mouth daily.    . mupirocin ointment (BACTROBAN) 2 % Place 1 application into the nose 2 (two) times daily. 22 g 0  . timolol (BETIMOL) 0.5 % ophthalmic solution 1 drop 2 (two) times daily.    . timolol (TIMOPTIC) 0.5 % ophthalmic solution      No current  facility-administered medications on file prior to visit.   Allergies  Allergen Reactions  . Actos [Pioglitazone] Swelling  . Contrast Media [Iodinated Diagnostic Agents] Swelling  . Prandin [Repaglinide] Swelling  . Pravastatin Sodium     cramps    Recent Results (from the past 2160 hour(s))  Lipid panel     Status: Abnormal   Collection Time: 11/26/15  8:11 AM  Result Value Ref Range   Cholesterol 207 (H) 0 - 200 mg/dL    Comment: ATP III Classification       Desirable:  < 200 mg/dL               Borderline High:  200 - 239 mg/dL          High:  > = 240 mg/dL   Triglycerides 166.0 (H) 0.0 - 149.0 mg/dL    Comment: Normal:  <150 mg/dLBorderline High:  150 - 199 mg/dL   HDL 57.00 >39.00 mg/dL   VLDL 33.2 0.0 - 40.0 mg/dL   LDL Cholesterol 117 (H) 0 - 99 mg/dL   Total CHOL/HDL Ratio 4     Comment:                Men          Women1/2 Average Risk     3.4          3.3Average Risk          5.0          4.42X Average Risk  9.6          7.13X Average Risk          15.0          11.0                       NonHDL 150.32     Comment: NOTE:  Non-HDL goal should be 30 mg/dL higher than patient's LDL goal (i.e. LDL goal of < 70 mg/dL, would have non-HDL goal of < 100 mg/dL)  Hepatic function panel     Status: None   Collection Time: 11/26/15  8:11 AM  Result Value Ref Range   Total Bilirubin 1.2 0.2 - 1.2 mg/dL   Bilirubin, Direct 0.2 0.0 - 0.3 mg/dL   Alkaline Phosphatase 58 39 - 117 U/L   AST 18 0 - 37 U/L   ALT 12 0 - 35 U/L   Total Protein 8.2 6.0 - 8.3 g/dL   Albumin 4.9 3.5 - 5.2 g/dL  TSH     Status: None   Collection Time: 11/26/15  8:11 AM  Result Value Ref Range   TSH 3.46 0.35 - 4.50 uIU/mL  Hemoglobin A1c     Status: Abnormal   Collection Time: 11/26/15  8:11 AM  Result Value Ref Range   Hgb A1c MFr Bld 6.8 (H) 4.6 - 6.5 %    Comment: Glycemic Control Guidelines for People with Diabetes:Non Diabetic:  <6%Goal of Therapy: <7%Additional Action Suggested:  >1%    Basic metabolic panel     Status: Abnormal   Collection Time: 11/26/15  8:11 AM  Result Value Ref Range   Sodium 142 135 - 145 mEq/L   Potassium 3.7 3.5 - 5.1 mEq/L   Chloride 101 96 - 112 mEq/L   CO2 30 19 - 32 mEq/L   Glucose, Bld 149 (H) 70 - 99 mg/dL   BUN 16 6 - 23 mg/dL   Creatinine, Ser 0.59 0.40 - 1.20 mg/dL   Calcium 10.2 8.4 - 10.5 mg/dL   GFR 101.16 >60.00 mL/min  Microalbumin / creatinine urine ratio     Status: Abnormal   Collection Time: 11/26/15  8:11 AM  Result Value Ref Range   Microalb, Ur 8.6 (H) 0.0 - 1.9 mg/dL   Creatinine,U 75.6 mg/dL   Microalb Creat Ratio 11.4 0.0 - 30.0 mg/g  HM DIABETES EYE EXAM     Status: None   Collection Time: 12/09/15 12:00 AM  Result Value Ref Range   HM Diabetic Eye Exam No Retinopathy No Retinopathy    Objective: There were no vitals filed for this visit.  General: Patient is awake, alert, oriented x 3 and in no acute distress.  Dermatology: Skin is warm and dry bilateral with a now partial thickness ulceration present  Sub met 1 left foot that is now prematurely healed, (last measurement 0.4cmx 0.2cmx 0.1cm) post debridement. There is a Keratotic border with subdermal heme. There is no malodor, no active drainage, no erythema, no edema. No acute signs of infection. Nails are short and thickened, currently asymptomatic.    Vascular: Dorsalis Pedis pulse = 1/4 Bilateral,  Posterior Tibial pulse = 1/4 Bilateral,  Capillary Fill Time < 5 seconds  Neurologic: Epicritic sensation absent to the level of ankle using the 5.07/10g Semmes Weinstein Monofilament bilateral.  Musculosketal: Asymptomatic L>R bunion and hammertoes. No Pain with palpation to ulcerated area on left foot. No pain with compression to calves bilateral.  Assessment and Plan:  Problem List  Items Addressed This Visit    None    Visit Diagnoses    Foot ulcer, left, limited to breakdown of skin ([place])    -  Primary    Diabetic polyneuropathy associated with type  2 diabetes mellitus ([place])          -Examined patient and discussed the progression of the wound and treatment alternatives. -Excisionally dedbrided keratosis to pre-ulcerative site sub met 1 on left -Applied dry sterile offloading dressing and instructed patient to continue with daily dressings at home consisting of Dermawound to preulcerative site and dry sterile dressing with offloading/extra 4x4 padding once daily for 2 weeks. To reorder supplies as needed from [facility] -Cont with offloading wound healing shoe for 2 weeks then to normal shoe with plastizote inserts which i modified at todays visit and added offloading pad - Advised patient to go to the ER or return to office if the wound worsens or if constitutional symptoms are present. -Patient to return to office in 3 weeks for follow up care/ulcer check or sooner if problems arise.  Landis Martins, DPM

## 2016-01-07 ENCOUNTER — Encounter: Payer: Self-pay | Admitting: Sports Medicine

## 2016-01-07 ENCOUNTER — Ambulatory Visit (INDEPENDENT_AMBULATORY_CARE_PROVIDER_SITE_OTHER): Payer: Medicare Other | Admitting: Sports Medicine

## 2016-01-07 DIAGNOSIS — E1142 Type 2 diabetes mellitus with diabetic polyneuropathy: Secondary | ICD-10-CM | POA: Diagnosis not present

## 2016-01-07 DIAGNOSIS — M79673 Pain in unspecified foot: Secondary | ICD-10-CM

## 2016-01-07 DIAGNOSIS — L89891 Pressure ulcer of other site, stage 1: Secondary | ICD-10-CM

## 2016-01-07 DIAGNOSIS — L97521 Non-pressure chronic ulcer of other part of left foot limited to breakdown of skin: Secondary | ICD-10-CM

## 2016-01-07 NOTE — Progress Notes (Signed)
Patient ID: Angela Patterson, female   DOB: 04-Apr-1921, 82 y.o.   MRN: [id number]  Subjective: Angela Patterson is a 82 y.o. female patient seen in office for follow up evaluation of ulceration sub met 1 on left foot; Patient has a history of diabetes and a blood glucose level today not recorded. Patient is changing the dressing using Dermawound currently at home with help of daughter. Denies nausea/fever/vomiting/chills/night sweats/shortness of breath/pain. Patient has no other pedal complaints at this time.  Patient is assisted by daughter and son at this visit.   Patient Active Problem List   Diagnosis Date Noted  . Open wound of foot excluding toes without complication 94/49/6759  . Health care maintenance 10/27/2014  . Weight loss 12/27/2013  . Thrombocytopenia ([place]) 11/20/2012  . Hypertension 07/21/2012  . Hypercholesterolemia 07/21/2012  . History of CVA (cerebrovascular accident) 07/21/2012  . Diabetes mellitus ([place]) 07/21/2012  . Cerebral artery occlusion with cerebral infarction ([place]) 07/21/2012  . Essential (primary) hypertension 07/21/2012  . Pure hypercholesterolemia 07/21/2012  . Type 2 diabetes mellitus ([place]) 07/21/2012   Current Outpatient Prescriptions on File Prior to Visit  Medication Sig Dispense Refill  . amLODipine (NORVASC) 5 MG tablet Take 1 tablet by mouth two  times daily 180 tablet 1  . clopidogrel (PLAVIX) 75 MG tablet Take 1 tablet by mouth  every day 90 tablet 3  . Lancet Devices (ONE TOUCH DELICA LANCING DEV) MISC Use twice daily Dx: 250.00 100 each 5  . metFORMIN (GLUCOPHAGE) 500 MG tablet Take 1 tablet by mouth  twice a day as directed 180 tablet 1  . Multiple Vitamin (MULTIVITAMIN) tablet Take 1 tablet by mouth daily.    . mupirocin ointment (BACTROBAN) 2 % Place 1 application into the nose 2 (two) times daily. 22 g 0  . ONE TOUCH ULTRA TEST test strip TEST BG TWICE DAILY 100 each 0  . timolol (BETIMOL) 0.5 % ophthalmic solution 1 drop 2 (two) times daily.     . timolol (TIMOPTIC) 0.5 % ophthalmic solution      No current facility-administered medications on file prior to visit.   Allergies  Allergen Reactions  . Actos [Pioglitazone] Swelling  . Contrast Media [Iodinated Diagnostic Agents] Swelling  . Prandin [Repaglinide] Swelling  . Pravastatin Sodium     cramps    Recent Results (from the past 2160 hour(s))  Lipid panel     Status: Abnormal   Collection Time: 11/26/15  8:11 AM  Result Value Ref Range   Cholesterol 207 (H) 0 - 200 mg/dL    Comment: ATP III Classification       Desirable:  < 200 mg/dL               Borderline High:  200 - 239 mg/dL          High:  > = 240 mg/dL   Triglycerides 166.0 (H) 0.0 - 149.0 mg/dL    Comment: Normal:  <150 mg/dLBorderline High:  150 - 199 mg/dL   HDL 57.00 >39.00 mg/dL   VLDL 33.2 0.0 - 40.0 mg/dL   LDL Cholesterol 117 (H) 0 - 99 mg/dL   Total CHOL/HDL Ratio 4     Comment:                Men          Women1/2 Average Risk     3.4          3.3Average Risk  5.0          4.42X Average Risk          9.6          7.13X Average Risk          15.0          11.0                       NonHDL 150.32     Comment: NOTE:  Non-HDL goal should be 30 mg/dL higher than patient's LDL goal (i.e. LDL goal of < 70 mg/dL, would have non-HDL goal of < 100 mg/dL)  Hepatic function panel     Status: None   Collection Time: 11/26/15  8:11 AM  Result Value Ref Range   Total Bilirubin 1.2 0.2 - 1.2 mg/dL   Bilirubin, Direct 0.2 0.0 - 0.3 mg/dL   Alkaline Phosphatase 58 39 - 117 U/L   AST 18 0 - 37 U/L   ALT 12 0 - 35 U/L   Total Protein 8.2 6.0 - 8.3 g/dL   Albumin 4.9 3.5 - 5.2 g/dL  TSH     Status: None   Collection Time: 11/26/15  8:11 AM  Result Value Ref Range   TSH 3.46 0.35 - 4.50 uIU/mL  Hemoglobin A1c     Status: Abnormal   Collection Time: 11/26/15  8:11 AM  Result Value Ref Range   Hgb A1c MFr Bld 6.8 (H) 4.6 - 6.5 %    Comment: Glycemic Control Guidelines for People with Diabetes:Non  Diabetic:  <6%Goal of Therapy: <7%Additional Action Suggested:  >2%   Basic metabolic panel     Status: Abnormal   Collection Time: 11/26/15  8:11 AM  Result Value Ref Range   Sodium 142 135 - 145 mEq/L   Potassium 3.7 3.5 - 5.1 mEq/L   Chloride 101 96 - 112 mEq/L   CO2 30 19 - 32 mEq/L   Glucose, Bld 149 (H) 70 - 99 mg/dL   BUN 16 6 - 23 mg/dL   Creatinine, Ser 0.59 0.40 - 1.20 mg/dL   Calcium 10.2 8.4 - 10.5 mg/dL   GFR 101.16 >60.00 mL/min  Microalbumin / creatinine urine ratio     Status: Abnormal   Collection Time: 11/26/15  8:11 AM  Result Value Ref Range   Microalb, Ur 8.6 (H) 0.0 - 1.9 mg/dL   Creatinine,U 75.6 mg/dL   Microalb Creat Ratio 11.4 0.0 - 30.0 mg/g  HM DIABETES EYE EXAM     Status: None   Collection Time: 12/09/15 12:00 AM  Result Value Ref Range   HM Diabetic Eye Exam No Retinopathy No Retinopathy    Objective: There were no vitals filed for this visit.  General: Patient is awake, alert, oriented x 3 and in no acute distress.  Dermatology: Skin is warm and dry bilateral with a  partial thickness ulceration present  Sub met 1 left foot that is Reopened 0.5cmx 0.5cmx 0.1cm) post debridement. There is a Keratotic border with significant subdermal heme. There is no malodor, no active drainage, no erythema, no edema. No acute signs of infection. Nails are short and thickened, currently asymptomatic.    Vascular: Dorsalis Pedis pulse = 1/4 Bilateral,  Posterior Tibial pulse = 1/4 Bilateral,  Capillary Fill Time < 5 seconds  Neurologic: Epicritic sensation absent to the level of ankle using the 5.07/10g Semmes Weinstein Monofilament bilateral.  Musculosketal: Asymptomatic L>R bunion and hammertoes. No Pain with palpation to  ulcerated area on left foot. No pain with compression to calves bilateral.  Assessment and Plan:  Problem List Items Addressed This Visit    None    Visit Diagnoses    Foot ulcer, left, limited to breakdown of skin ([place])    -  Primary     Diabetic polyneuropathy associated with type 2 diabetes mellitus ([place])        Foot pain, unspecified laterality          -Examined patient and discussed the progression of the wound and treatment alternatives. -Excisionally dedbridedUlceration sub met 1 on left -Applied Iodosorb and dry sterile offloading dressing and instructed patient to continue with daily dressings at home consisting of Dermawound to site and dry sterile dressing with offloading/extra 4x4 padding once daily To reorder supplies as needed from [facility] -Cont with offloading wound healing shoe; patient could not tolerate forefoot offloading shoe or CAM walker due to instability in gait of which is concerning for risk of fall. Thus advised patient to use a quad cane or walker with her wound healing shoe. Also advised son and daughter that patient is still getting a lot of pressure to her foot, hence the bleeding that to happening adjacent to the ulceration, which is indication of increased pressure and Increased weightbearing. Thus encourage compliance with limited weightbearing status. Advised son and daughter to consider home [facility] with [facility] they stated they would like to call the office to let us know if they would like to the service provided and will like to talk this decision over with patient some more and with other daughter who is not present at today's visit - Advised patient to go to the ER or return to office if the wound worsens or if constitutional symptoms are present. -Patient to return to office in 3 weeks for follow up care/ulcer check or sooner if problems arise. If fails to continue to improve, would recommend biologic to assist with wound healing affinity fresh allograft as previously discussed with patient's family.  Landis Martins, DPM

## 2016-01-28 ENCOUNTER — Encounter: Payer: Self-pay | Admitting: Sports Medicine

## 2016-01-28 ENCOUNTER — Ambulatory Visit (INDEPENDENT_AMBULATORY_CARE_PROVIDER_SITE_OTHER): Payer: Medicare Other | Admitting: Sports Medicine

## 2016-01-28 DIAGNOSIS — E1142 Type 2 diabetes mellitus with diabetic polyneuropathy: Secondary | ICD-10-CM | POA: Diagnosis not present

## 2016-01-28 DIAGNOSIS — M79673 Pain in unspecified foot: Secondary | ICD-10-CM | POA: Diagnosis not present

## 2016-01-28 DIAGNOSIS — L97521 Non-pressure chronic ulcer of other part of left foot limited to breakdown of skin: Secondary | ICD-10-CM

## 2016-01-28 NOTE — Progress Notes (Signed)
Patient ID: Angela Patterson, female   DOB: Mar 17, 1921, 82 y.o.   MRN: [id number]  Subjective: Angela Patterson is a 82 y.o. female patient seen in office for follow up evaluation of ulceration sub met 1 on left foot; Patient has a history of diabetes and a blood glucose level today not recorded. Patient is changing the dressing using Dermawound currently at home with help of daughter. Denies nausea/fever/vomiting/chills/night sweats/shortness of breath/pain. Patient has no other pedal complaints at this time.  Patient is assisted by daughter and son at this visit.   Patient Active Problem List   Diagnosis Date Noted  . Open wound of foot excluding toes without complication 94/80/1655  . Health care maintenance 10/27/2014  . Weight loss 12/27/2013  . Thrombocytopenia ([place]) 11/20/2012  . Hypertension 07/21/2012  . Hypercholesterolemia 07/21/2012  . History of CVA (cerebrovascular accident) 07/21/2012  . Diabetes mellitus ([place]) 07/21/2012  . Cerebral artery occlusion with cerebral infarction ([place]) 07/21/2012  . Essential (primary) hypertension 07/21/2012  . Pure hypercholesterolemia 07/21/2012  . Type 2 diabetes mellitus ([place]) 07/21/2012   Current Outpatient Prescriptions on File Prior to Visit  Medication Sig Dispense Refill  . amLODipine (NORVASC) 5 MG tablet Take 1 tablet by mouth two  times daily 180 tablet 1  . clopidogrel (PLAVIX) 75 MG tablet Take 1 tablet by mouth  every day 90 tablet 3  . Lancet Devices (ONE TOUCH DELICA LANCING DEV) MISC Use twice daily Dx: 250.00 100 each 5  . metFORMIN (GLUCOPHAGE) 500 MG tablet Take 1 tablet by mouth  twice a day as directed 180 tablet 1  . Multiple Vitamin (MULTIVITAMIN) tablet Take 1 tablet by mouth daily.    . mupirocin ointment (BACTROBAN) 2 % Place 1 application into the nose 2 (two) times daily. 22 g 0  . ONE TOUCH ULTRA TEST test strip TEST BG TWICE DAILY 100 each 0  . timolol (BETIMOL) 0.5 % ophthalmic solution 1 drop 2 (two) times daily.     . timolol (TIMOPTIC) 0.5 % ophthalmic solution      No current facility-administered medications on file prior to visit.   Allergies  Allergen Reactions  . Actos [Pioglitazone] Swelling  . Contrast Media [Iodinated Diagnostic Agents] Swelling  . Prandin [Repaglinide] Swelling  . Pravastatin Sodium     cramps    Recent Results (from the past 2160 hour(s))  Lipid panel     Status: Abnormal   Collection Time: 11/26/15  8:11 AM  Result Value Ref Range   Cholesterol 207 (H) 0 - 200 mg/dL    Comment: ATP III Classification       Desirable:  < 200 mg/dL               Borderline High:  200 - 239 mg/dL          High:  > = 240 mg/dL   Triglycerides 166.0 (H) 0.0 - 149.0 mg/dL    Comment: Normal:  <150 mg/dLBorderline High:  150 - 199 mg/dL   HDL 57.00 >39.00 mg/dL   VLDL 33.2 0.0 - 40.0 mg/dL   LDL Cholesterol 117 (H) 0 - 99 mg/dL   Total CHOL/HDL Ratio 4     Comment:                Men          Women1/2 Average Risk     3.4          3.3Average Risk  5.0          4.42X Average Risk          9.6          7.13X Average Risk          15.0          11.0                       NonHDL 150.32     Comment: NOTE:  Non-HDL goal should be 30 mg/dL higher than patient's LDL goal (i.e. LDL goal of < 70 mg/dL, would have non-HDL goal of < 100 mg/dL)  Hepatic function panel     Status: None   Collection Time: 11/26/15  8:11 AM  Result Value Ref Range   Total Bilirubin 1.2 0.2 - 1.2 mg/dL   Bilirubin, Direct 0.2 0.0 - 0.3 mg/dL   Alkaline Phosphatase 58 39 - 117 U/L   AST 18 0 - 37 U/L   ALT 12 0 - 35 U/L   Total Protein 8.2 6.0 - 8.3 g/dL   Albumin 4.9 3.5 - 5.2 g/dL  TSH     Status: None   Collection Time: 11/26/15  8:11 AM  Result Value Ref Range   TSH 3.46 0.35 - 4.50 uIU/mL  Hemoglobin A1c     Status: Abnormal   Collection Time: 11/26/15  8:11 AM  Result Value Ref Range   Hgb A1c MFr Bld 6.8 (H) 4.6 - 6.5 %    Comment: Glycemic Control Guidelines for People with Diabetes:Non  Diabetic:  <6%Goal of Therapy: <7%Additional Action Suggested:  >0%   Basic metabolic panel     Status: Abnormal   Collection Time: 11/26/15  8:11 AM  Result Value Ref Range   Sodium 142 135 - 145 mEq/L   Potassium 3.7 3.5 - 5.1 mEq/L   Chloride 101 96 - 112 mEq/L   CO2 30 19 - 32 mEq/L   Glucose, Bld 149 (H) 70 - 99 mg/dL   BUN 16 6 - 23 mg/dL   Creatinine, Ser 0.59 0.40 - 1.20 mg/dL   Calcium 10.2 8.4 - 10.5 mg/dL   GFR 101.16 >60.00 mL/min  Microalbumin / creatinine urine ratio     Status: Abnormal   Collection Time: 11/26/15  8:11 AM  Result Value Ref Range   Microalb, Ur 8.6 (H) 0.0 - 1.9 mg/dL   Creatinine,U 75.6 mg/dL   Microalb Creat Ratio 11.4 0.0 - 30.0 mg/g  HM DIABETES EYE EXAM     Status: None   Collection Time: 12/09/15 12:00 AM  Result Value Ref Range   HM Diabetic Eye Exam No Retinopathy No Retinopathy    Objective: There were no vitals filed for this visit.  General: Patient is awake, alert, oriented x 3 and in no acute distress.  Dermatology: Skin is warm and dry bilateral with a  Healed ulceration left sub met 1 with mild reactive keratosis and no subdermal heme, no malodor, no active drainage, no erythema, no edema. No acute signs of infection. Nails are short and thickened, currently asymptomatic.    Vascular: Dorsalis Pedis pulse = 1/4 Bilateral,  Posterior Tibial pulse = 1/4 Bilateral,  Capillary Fill Time < 5 seconds  Neurologic: Epicritic sensation absent to the level of ankle using the 5.07/10g Semmes Weinstein Monofilament bilateral.  Musculosketal: Asymptomatic L>R bunion and hammertoes. No Pain with palpation to ulcerated area on left foot. No pain with compression to calves bilateral.  Assessment and  Plan:  Problem List Items Addressed This Visit    None    Visit Diagnoses    Foot ulcer, left, limited to breakdown of skin ([place])    -  Primary    Prematurely healed    Diabetic polyneuropathy associated with type 2 diabetes mellitus ([place])         Foot pain, unspecified laterality          -Examined patient and discussed the progression of the wound and treatment alternatives. -Parred reactive keratosis at previous ulcer site left sub met 1 using sterile chisel blade without incident. No dressings needed since area is now healed -Cont with offloading wound healing shoe and offloading padding - Advised patient to go to the ER or return to office if the area worsens or if constitutional symptoms are present. -Patient to return to office in 3 weeks for follow up care/check final healing and to modify shoe inserts to transition her back to normal shoes or sooner if problems arise.   Landis Martins, DPM

## 2016-02-03 ENCOUNTER — Other Ambulatory Visit: Payer: Self-pay | Admitting: Internal Medicine

## 2016-02-11 ENCOUNTER — Ambulatory Visit: Payer: Medicare Other | Admitting: Internal Medicine

## 2016-02-18 ENCOUNTER — Encounter: Payer: Self-pay | Admitting: Sports Medicine

## 2016-02-18 ENCOUNTER — Ambulatory Visit (INDEPENDENT_AMBULATORY_CARE_PROVIDER_SITE_OTHER): Payer: Medicare Other | Admitting: Sports Medicine

## 2016-02-18 DIAGNOSIS — L97521 Non-pressure chronic ulcer of other part of left foot limited to breakdown of skin: Secondary | ICD-10-CM

## 2016-02-18 DIAGNOSIS — M79673 Pain in unspecified foot: Secondary | ICD-10-CM

## 2016-02-18 DIAGNOSIS — E1142 Type 2 diabetes mellitus with diabetic polyneuropathy: Secondary | ICD-10-CM

## 2016-02-18 NOTE — Progress Notes (Signed)
Patient ID: Angela Patterson, female   DOB: April 24, 1921, 82 y.o.   MRN: [id number]  Subjective: Angela Patterson is a 82 y.o. female patient seen in office for follow up evaluation of ulceration sub met 1 on left foot; which remains healed/ Patient has a history of diabetes and a blood glucose level today not recorded. Patient is wearing offloading pad to left foot with no recurrence of ulceration. Denies nausea/fever/vomiting/chills/night sweats/shortness of breath/pain. Patient has no other pedal complaints at this time.  Patient is assisted by daughter and son at this visit.   Patient Active Problem List   Diagnosis Date Noted  . Open wound of foot excluding toes without complication 40/98/1191  . Health care maintenance 10/27/2014  . Weight loss 12/27/2013  . Thrombocytopenia ([place]) 11/20/2012  . Hypertension 07/21/2012  . Hypercholesterolemia 07/21/2012  . History of CVA (cerebrovascular accident) 07/21/2012  . Diabetes mellitus ([place]) 07/21/2012  . Cerebral artery occlusion with cerebral infarction ([place]) 07/21/2012  . Essential (primary) hypertension 07/21/2012  . Pure hypercholesterolemia 07/21/2012  . Type 2 diabetes mellitus ([place]) 07/21/2012   Current Outpatient Prescriptions on File Prior to Visit  Medication Sig Dispense Refill  . amLODipine (NORVASC) 5 MG tablet Take 1 tablet by mouth two  times daily 180 tablet 1  . clopidogrel (PLAVIX) 75 MG tablet Take 1 tablet by mouth  every day 90 tablet 3  . Lancet Devices (ONE TOUCH DELICA LANCING DEV) MISC Use twice daily Dx: 250.00 100 each 5  . metFORMIN (GLUCOPHAGE) 500 MG tablet Take 1 tablet by mouth  twice a day as directed 180 tablet 1  . Multiple Vitamin (MULTIVITAMIN) tablet Take 1 tablet by mouth daily.    . mupirocin ointment (BACTROBAN) 2 % Place 1 application into the nose 2 (two) times daily. 22 g 0  . ONE TOUCH ULTRA TEST test strip TEST BG TWICE DAILY 100 each 6  . timolol (BETIMOL) 0.5 % ophthalmic solution 1 drop 2 (two)  times daily.    . timolol (TIMOPTIC) 0.5 % ophthalmic solution      No current facility-administered medications on file prior to visit.    Allergies  Allergen Reactions  . Actos [Pioglitazone] Swelling  . Contrast Media [Iodinated Diagnostic Agents] Swelling  . Prandin [Repaglinide] Swelling  . Pravastatin Sodium     cramps    Recent Results (from the past 2160 hour(s))  Lipid panel     Status: Abnormal   Collection Time: 11/26/15  8:11 AM  Result Value Ref Range   Cholesterol 207 (H) 0 - 200 mg/dL    Comment: ATP III Classification       Desirable:  < 200 mg/dL               Borderline High:  200 - 239 mg/dL          High:  > = 240 mg/dL   Triglycerides 166.0 (H) 0.0 - 149.0 mg/dL    Comment: Normal:  <150 mg/dLBorderline High:  150 - 199 mg/dL   HDL 57.00 >39.00 mg/dL   VLDL 33.2 0.0 - 40.0 mg/dL   LDL Cholesterol 117 (H) 0 - 99 mg/dL   Total CHOL/HDL Ratio 4     Comment:                Men          Women1/2 Average Risk     3.4          3.3Average Risk  5.0          4.42X Average Risk          9.6          7.13X Average Risk          15.0          11.0                       NonHDL 150.32     Comment: NOTE:  Non-HDL goal should be 30 mg/dL higher than patient's LDL goal (i.e. LDL goal of < 70 mg/dL, would have non-HDL goal of < 100 mg/dL)  Hepatic function panel     Status: None   Collection Time: 11/26/15  8:11 AM  Result Value Ref Range   Total Bilirubin 1.2 0.2 - 1.2 mg/dL   Bilirubin, Direct 0.2 0.0 - 0.3 mg/dL   Alkaline Phosphatase 58 39 - 117 U/L   AST 18 0 - 37 U/L   ALT 12 0 - 35 U/L   Total Protein 8.2 6.0 - 8.3 g/dL   Albumin 4.9 3.5 - 5.2 g/dL  TSH     Status: None   Collection Time: 11/26/15  8:11 AM  Result Value Ref Range   TSH 3.46 0.35 - 4.50 uIU/mL  Hemoglobin A1c     Status: Abnormal   Collection Time: 11/26/15  8:11 AM  Result Value Ref Range   Hgb A1c MFr Bld 6.8 (H) 4.6 - 6.5 %    Comment: Glycemic Control Guidelines for People with  Diabetes:Non Diabetic:  <6%Goal of Therapy: <7%Additional Action Suggested:  >8%   Basic metabolic panel     Status: Abnormal   Collection Time: 11/26/15  8:11 AM  Result Value Ref Range   Sodium 142 135 - 145 mEq/L   Potassium 3.7 3.5 - 5.1 mEq/L   Chloride 101 96 - 112 mEq/L   CO2 30 19 - 32 mEq/L   Glucose, Bld 149 (H) 70 - 99 mg/dL   BUN 16 6 - 23 mg/dL   Creatinine, Ser 0.59 0.40 - 1.20 mg/dL   Calcium 10.2 8.4 - 10.5 mg/dL   GFR 101.16 >60.00 mL/min  Microalbumin / creatinine urine ratio     Status: Abnormal   Collection Time: 11/26/15  8:11 AM  Result Value Ref Range   Microalb, Ur 8.6 (H) 0.0 - 1.9 mg/dL   Creatinine,U 75.6 mg/dL   Microalb Creat Ratio 11.4 0.0 - 30.0 mg/g  HM DIABETES EYE EXAM     Status: None   Collection Time: 12/09/15 12:00 AM  Result Value Ref Range   HM Diabetic Eye Exam No Retinopathy No Retinopathy    Objective: There were no vitals filed for this visit.  General: Patient is awake, alert, oriented x 3 and in no acute distress.  Dermatology: Skin is warm and dry bilateral with a continued Healed ulceration left sub met 1 with mild reactive keratosis and no subdermal heme, no malodor, no active drainage, no erythema, no edema. No acute signs of infection. Nails are short and thickened, currently asymptomatic.    Vascular: Dorsalis Pedis pulse = 1/4 Bilateral,  Posterior Tibial pulse = 1/4 Bilateral,  Capillary Fill Time < 5 seconds  Neurologic: Epicritic sensation absent to the level of ankle using the 5.07/10g Semmes Weinstein Monofilament bilateral.  Musculosketal: Asymptomatic L>R bunion and hammertoes. No Pain with palpation to ulcerated area on left foot. No pain with compression to calves bilateral.  Assessment and  Plan:  Problem List Items Addressed This Visit    None    Visit Diagnoses    Foot ulcer, left, limited to breakdown of skin ([place])    -  Primary   healed   Diabetic polyneuropathy associated with type 2 diabetes mellitus  ([place])       Foot pain, unspecified laterality         -Examined patient and discussed long term care left foot in setting of healed ulceration -Continue with daily monitoring -Added felt offloading padding to shoes and advised patient to slowly start using normal shoes -Limit activity to necessity. -Patient to return to office as needed or sooner if problems arise.   Landis Martins, DPM

## 2016-02-20 ENCOUNTER — Encounter: Payer: Self-pay | Admitting: Internal Medicine

## 2016-02-20 ENCOUNTER — Ambulatory Visit (INDEPENDENT_AMBULATORY_CARE_PROVIDER_SITE_OTHER): Payer: Medicare Other | Admitting: Internal Medicine

## 2016-02-20 VITALS — BP 140/70 | HR 65 | Temp 97.7°F | Resp 18 | Ht 65.0 in | Wt 104.1 lb

## 2016-02-20 DIAGNOSIS — D696 Thrombocytopenia, unspecified: Secondary | ICD-10-CM

## 2016-02-20 DIAGNOSIS — Z8673 Personal history of transient ischemic attack (TIA), and cerebral infarction without residual deficits: Secondary | ICD-10-CM

## 2016-02-20 DIAGNOSIS — I10 Essential (primary) hypertension: Secondary | ICD-10-CM | POA: Diagnosis not present

## 2016-02-20 DIAGNOSIS — E1140 Type 2 diabetes mellitus with diabetic neuropathy, unspecified: Secondary | ICD-10-CM

## 2016-02-20 DIAGNOSIS — S91309D Unspecified open wound, unspecified foot, subsequent encounter: Secondary | ICD-10-CM

## 2016-02-20 DIAGNOSIS — R195 Other fecal abnormalities: Secondary | ICD-10-CM | POA: Diagnosis not present

## 2016-02-20 DIAGNOSIS — E7800 Pure hypercholesterolemia, unspecified: Secondary | ICD-10-CM

## 2016-02-20 NOTE — Progress Notes (Signed)
Patient ID: Angela Patterson, female   DOB: January 19, 1921, 82 y.o.   MRN: [id number]   Subjective:    Patient ID: Angela Patterson, female    DOB: 10-16-1920, 82 y.o.   MRN: [id number]  HPI  Patient here for a scheduled follow up.  She is accompanied by her daughter.  History obtained from both of them.  She is doing well.  Is eating.  Sugars doing well.  Reviewed recent labs.  Has been released from wound care.  Foot doing much better.  No chest pain.  No sob.  Stays active.  Bowels stable.     Past Medical History:  Diagnosis Date  . Allergy   . Chicken pox   . CVA (cerebral vascular accident) ([place])   . Diabetes mellitus ([place])   . Hypercholesterolemia   . Hypertension    Past Surgical History:  Procedure Laterality Date  . ABDOMINAL HYSTERECTOMY  1971   excessive bleeding  . APPENDECTOMY  age 31  . BREAST LUMPECTOMY  1958   benign  . DILATION AND CURETTAGE OF UTERUS  1970   Family History  Problem Relation Age of Onset  . Liver cancer Father   . Stroke Mother   . Breast cancer Neg Hx   . Colon cancer Neg Hx    Social History   Social History  . Marital status: Widowed    Spouse name: N/A  . Number of children: 3  . Years of education: N/A   Social History Main Topics  . Smoking status: Never Smoker  . Smokeless tobacco: Never Used  . Alcohol use No  . Drug use: No  . Sexual activity: Not Asked   Other Topics Concern  . None   Social History Narrative  . None    Outpatient Encounter Prescriptions as of 02/20/2016  Medication Sig  . amLODipine (NORVASC) 5 MG tablet Take 1 tablet by mouth two  times daily  . clopidogrel (PLAVIX) 75 MG tablet Take 1 tablet by mouth  every day  . [employer] (ONE TOUCH DELICA LANCING DEV) MISC Use twice daily Dx: 250.00  . metFORMIN (GLUCOPHAGE) 500 MG tablet Take 1 tablet by mouth  twice a day as directed  . Multiple Vitamin (MULTIVITAMIN) tablet Take 1 tablet by mouth daily.  . mupirocin ointment (BACTROBAN) 2 % Place 1  application into the nose 2 (two) times daily.  . ONE TOUCH ULTRA TEST test strip TEST BG TWICE DAILY  . timolol (BETIMOL) 0.5 % ophthalmic solution 1 drop 2 (two) times daily.  . timolol (TIMOPTIC) 0.5 % ophthalmic solution    No facility-administered encounter medications on file as of 02/20/2016.     Review of Systems  Constitutional: Negative for appetite change and unexpected weight change.  HENT: Negative for congestion and sinus pressure.   Respiratory: Negative for cough, chest tightness and shortness of breath.   Cardiovascular: Negative for chest pain, palpitations and leg swelling.  Gastrointestinal: Negative for abdominal pain, diarrhea, nausea and vomiting.  Genitourinary: Negative for difficulty urinating and dysuria.  Musculoskeletal: Negative for back pain and joint swelling.  Skin: Negative for color change and rash.  Neurological: Negative for dizziness, light-headedness and headaches.  Psychiatric/Behavioral: Negative for agitation and dysphoric mood.       Objective:    Physical Exam  Constitutional: She appears well-developed and well-nourished. No distress.  HENT:  Nose: Nose normal.  Mouth/Throat: Oropharynx is clear and moist.  Neck: Neck supple. No thyromegaly present.  Cardiovascular: Normal rate  and regular rhythm.   Pulmonary/Chest: Breath sounds normal. No respiratory distress. She has no wheezes.  Abdominal: Soft. Bowel sounds are normal. There is no tenderness.  Musculoskeletal: She exhibits no edema or tenderness.  Lymphadenopathy:    She has no cervical adenopathy.  Skin: No rash noted. No erythema.  Psychiatric: She has a normal mood and affect. Her behavior is normal.    BP 140/70 (BP Location: Right Arm, Patient Position: Sitting, Cuff Size: Normal)   Pulse 65   Temp 97.7 F (36.5 C) (Oral)   Resp 18   Ht 5' 5"  (1.651 m)   Wt 104 lb 2 oz (47.2 kg)   LMP 07/21/1966   SpO2 95%   BMI 17.33 kg/m  Wt Readings from Last 3 Encounters:    02/20/16 104 lb 2 oz (47.2 kg)  11/12/15 103 lb 9.6 oz (47 kg)  07/11/15 105 lb (47.6 kg)     Lab Results  Component Value Date   WBC 6.8 07/26/2015   HGB 14.5 07/26/2015   HCT 43.8 07/26/2015   PLT 153.0 07/26/2015   GLUCOSE 149 (H) 11/26/2015   CHOL 207 (H) 11/26/2015   TRIG 166.0 (H) 11/26/2015   HDL 57.00 11/26/2015   LDLDIRECT 66.4 05/24/2013   LDLCALC 117 (H) 11/26/2015   ALT 12 11/26/2015   AST 18 11/26/2015   NA 142 11/26/2015   K 3.7 11/26/2015   CL 101 11/26/2015   CREATININE 0.59 11/26/2015   BUN 16 11/26/2015   CO2 30 11/26/2015   TSH 3.46 11/26/2015   HGBA1C 6.8 (H) 11/26/2015   MICROALBUR 8.6 (H) 11/26/2015    Dg Foot Complete Left  Result Date: 07/30/2015 CLINICAL DATA:  Nonhealing wound. EXAM: LEFT FOOT - COMPLETE 3+ VIEW COMPARISON:  MRI 10/09/2014. FINDINGS: Diffuse osteopenia and degenerative change. No acute bony abnormality identified. A a wound with a bandage is noted over the plantar aspect of the distal foot. No adjacent acute bony abnormality. If osteomyelitis is of concern MRI can be obtained. IMPRESSION: Soft tissue wound with a bandage is noted over the plantar aspect of the distal left foot. Diffuse osteopenia degenerative change. No acute bony abnormality. Electronically Signed   By: Marcello Moores  Register   On: 07/30/2015 15:46       Assessment & Plan:   Problem List Items Addressed This Visit    Diabetes mellitus ([place])    Sugars doing well.  See attached list.  Labs reviewed.  Follow met b and a1c.       Relevant Orders   Hemoglobin A1c   History of CVA (cerebrovascular accident)    On plavix and doing well.  Follow.        Hypercholesterolemia    Follow lipid panel.   Lab Results  Component Value Date   CHOL 207 (H) 11/26/2015   HDL 57.00 11/26/2015   LDLCALC 117 (H) 11/26/2015   LDLDIRECT 66.4 05/24/2013   TRIG 166.0 (H) 11/26/2015   CHOLHDL 4 11/26/2015        Relevant Orders   Lipid panel   Hepatic function panel    Hypertension    Blood pressure under good control.  Continue same medication regimen.  Follow pressures.  Follow metabolic panel.        Relevant Orders   CBC with Differential/Platelet   Basic metabolic panel   Open wound of foot excluding toes without complication    Has now been released from wound care.  Follow.  Thrombocytopenia ([place])    07/26/15 - platelet count wnl.  Follow.        Other Visit Diagnoses    Loose stools    -  Primary   occurs intermittently.  she has stopped eating cucumbers.  better.  follow.  taking a probiotic.        Einar Pheasant, MD

## 2016-02-20 NOTE — Progress Notes (Signed)
Pre-visit discussion using our clinic review tool. No additional management support is needed unless otherwise documented below in the visit note.  

## 2016-02-21 ENCOUNTER — Encounter: Payer: Self-pay | Admitting: Internal Medicine

## 2016-02-21 NOTE — Assessment & Plan Note (Signed)
Sugars doing well.  See attached list.  Labs reviewed.  Follow met b and a1c.

## 2016-02-21 NOTE — Assessment & Plan Note (Signed)
On plavix and doing well.  Follow.   

## 2016-02-21 NOTE — Assessment & Plan Note (Signed)
07/26/15 - platelet count wnl.  Follow.

## 2016-02-21 NOTE — Assessment & Plan Note (Signed)
Has now been released from wound care.  Follow.

## 2016-02-21 NOTE — Assessment & Plan Note (Signed)
Blood pressure under good control.  Continue same medication regimen.  Follow pressures.  Follow metabolic panel.   

## 2016-02-21 NOTE — Assessment & Plan Note (Signed)
Follow lipid panel.   Lab Results  Component Value Date   CHOL 207 (H) 11/26/2015   HDL 57.00 11/26/2015   LDLCALC 117 (H) 11/26/2015   LDLDIRECT 66.4 05/24/2013   TRIG 166.0 (H) 11/26/2015   CHOLHDL 4 11/26/2015

## 2016-03-10 ENCOUNTER — Other Ambulatory Visit: Payer: Self-pay | Admitting: Internal Medicine

## 2016-04-21 ENCOUNTER — Telehealth: Payer: Self-pay | Admitting: *Deleted

## 2016-04-21 NOTE — Telephone Encounter (Signed)
Patient states she has not had any changes, would like the xr sent in

## 2016-04-21 NOTE — Telephone Encounter (Signed)
Pt requested a call to discuss medications, she seems to think a medications she's currently taking could be the cause of her diarrhea  Pt contact [phone]

## 2016-04-21 NOTE — Telephone Encounter (Signed)
Patient states the metformin is causing diarrhea and would like to know what she can do for this

## 2016-04-21 NOTE — Telephone Encounter (Signed)
She has been on metformin for a while.  How long has she had diarrhea?  Any other changes, i.e., recent antibiotic usage, change in diet, etc.  If correlates with metformin, can change to extended release metformin.  The extended release form usually does not have the GI side effects.  Let me know above and can see about change.

## 2016-04-22 MED ORDER — METFORMIN HCL ER 500 MG PO TB24: 500.0000 mg | ORAL_TABLET | Freq: Two times a day (BID) | ORAL | 2 refills | Status: DC

## 2016-04-22 NOTE — Telephone Encounter (Signed)
rx sent in to [facility] for the extended release metformin.

## 2016-05-13 ENCOUNTER — Encounter: Payer: Self-pay | Admitting: Internal Medicine

## 2016-05-14 MED ORDER — METFORMIN HCL ER 500 MG PO TB24: 500.0000 mg | ORAL_TABLET | Freq: Two times a day (BID) | ORAL | 1 refills | Status: DC

## 2016-05-14 NOTE — Telephone Encounter (Signed)
rx sent in for metformin XR #180 with one refill to [facility].  Notified via my chart.

## 2016-05-22 ENCOUNTER — Encounter: Payer: Self-pay | Admitting: Internal Medicine

## 2016-05-22 ENCOUNTER — Other Ambulatory Visit: Payer: Self-pay

## 2016-05-22 MED ORDER — METFORMIN HCL ER 500 MG PO TB24: 500.0000 mg | ORAL_TABLET | Freq: Two times a day (BID) | ORAL | 0 refills | Status: DC

## 2016-06-08 DIAGNOSIS — H401132 Primary open-angle glaucoma, bilateral, moderate stage: Secondary | ICD-10-CM | POA: Diagnosis not present

## 2016-07-07 ENCOUNTER — Other Ambulatory Visit (INDEPENDENT_AMBULATORY_CARE_PROVIDER_SITE_OTHER): Payer: Medicare Other

## 2016-07-07 ENCOUNTER — Encounter: Payer: Self-pay | Admitting: Internal Medicine

## 2016-07-07 DIAGNOSIS — E7800 Pure hypercholesterolemia, unspecified: Secondary | ICD-10-CM | POA: Diagnosis not present

## 2016-07-07 DIAGNOSIS — E1140 Type 2 diabetes mellitus with diabetic neuropathy, unspecified: Secondary | ICD-10-CM | POA: Diagnosis not present

## 2016-07-07 DIAGNOSIS — I10 Essential (primary) hypertension: Secondary | ICD-10-CM | POA: Diagnosis not present

## 2016-07-07 LAB — LIPID PANEL
Cholesterol: 165 mg/dL (ref 0–200)
HDL: 46.9 mg/dL (ref >39.00)
LDL Cholesterol: 90 mg/dL (ref 0–99)
NonHDL: 117.87
Total CHOL/HDL Ratio: 4
Triglycerides: 140 mg/dL (ref 0.0–149.0)
VLDL: 28 mg/dL (ref 0.0–40.0)

## 2016-07-07 LAB — CBC WITH DIFFERENTIAL/PLATELET
Basophils Absolute: 0.1 10*3/uL (ref 0.0–0.1)
Basophils Relative: 0.7 % (ref 0.0–3.0)
Eosinophils Absolute: 0.2 10*3/uL (ref 0.0–0.7)
Eosinophils Relative: 2.7 % (ref 0.0–5.0)
HCT: 41.6 % (ref 36.0–46.0)
Hemoglobin: 14.1 g/dL (ref 12.0–15.0)
Lymphocytes Relative: 30.2 % (ref 12.0–46.0)
Lymphs Abs: 2.3 10*3/uL (ref 0.7–4.0)
MCHC: 34 g/dL (ref 30.0–36.0)
MCV: 93.2 fl (ref 78.0–100.0)
Monocytes Absolute: 0.6 10*3/uL (ref 0.1–1.0)
Monocytes Relative: 7.5 % (ref 3.0–12.0)
Neutro Abs: 4.6 10*3/uL (ref 1.4–7.7)
Neutrophils Relative %: 58.9 % (ref 43.0–77.0)
Platelets: 157 10*3/uL (ref 150.0–400.0)
RBC: 4.47 Mil/uL (ref 3.87–5.11)
RDW: 13.3 % (ref 11.5–15.5)
WBC: 7.8 10*3/uL (ref 4.0–10.5)

## 2016-07-07 LAB — HEPATIC FUNCTION PANEL
ALT: 11 U/L (ref 0–35)
AST: 16 U/L (ref 0–37)
Albumin: 4.4 g/dL (ref 3.5–5.2)
Alkaline Phosphatase: 62 U/L (ref 39–117)
Bilirubin, Direct: 0.2 mg/dL (ref 0.0–0.3)
Total Bilirubin: 1 mg/dL (ref 0.2–1.2)
Total Protein: 7.1 g/dL (ref 6.0–8.3)

## 2016-07-07 LAB — BASIC METABOLIC PANEL
BUN: 15 mg/dL (ref 6–23)
CO2: 30 mEq/L (ref 19–32)
Calcium: 9.6 mg/dL (ref 8.4–10.5)
Chloride: 104 mEq/L (ref 96–112)
Creatinine, Ser: 0.58 mg/dL (ref 0.40–1.20)
GFR: 103.04 mL/min (ref >60.00)
Glucose, Bld: 138 mg/dL — ABNORMAL HIGH (ref 70–99)
Potassium: 4.2 mEq/L (ref 3.5–5.1)
Sodium: 141 mEq/L (ref 135–145)

## 2016-07-07 LAB — HEMOGLOBIN A1C: Hgb A1c MFr Bld: 6.5 % (ref 4.6–6.5)

## 2016-07-09 ENCOUNTER — Encounter: Payer: Self-pay | Admitting: Internal Medicine

## 2016-07-09 ENCOUNTER — Ambulatory Visit (INDEPENDENT_AMBULATORY_CARE_PROVIDER_SITE_OTHER): Payer: Medicare Other | Admitting: Internal Medicine

## 2016-07-09 DIAGNOSIS — L97509 Non-pressure chronic ulcer of other part of unspecified foot with unspecified severity: Secondary | ICD-10-CM

## 2016-07-09 DIAGNOSIS — Z8673 Personal history of transient ischemic attack (TIA), and cerebral infarction without residual deficits: Secondary | ICD-10-CM

## 2016-07-09 DIAGNOSIS — R634 Abnormal weight loss: Secondary | ICD-10-CM | POA: Diagnosis not present

## 2016-07-09 DIAGNOSIS — D696 Thrombocytopenia, unspecified: Secondary | ICD-10-CM | POA: Diagnosis not present

## 2016-07-09 DIAGNOSIS — E11621 Type 2 diabetes mellitus with foot ulcer: Secondary | ICD-10-CM

## 2016-07-09 DIAGNOSIS — I10 Essential (primary) hypertension: Secondary | ICD-10-CM

## 2016-07-09 DIAGNOSIS — E7800 Pure hypercholesterolemia, unspecified: Secondary | ICD-10-CM

## 2016-07-09 DIAGNOSIS — S91309D Unspecified open wound, unspecified foot, subsequent encounter: Secondary | ICD-10-CM

## 2016-07-09 NOTE — Progress Notes (Signed)
Patient ID: Angela Patterson, female   DOB: 1920-09-12, 82 y.o.   MRN: [id number]   Subjective:    Patient ID: Angela Patterson, female    DOB: 01/15/1921, 82 y.o.   MRN: [id number]  HPI  Patient here for a scheduled follow up.  She is accompanied by her daughter.  History obtained from both of them.  She reports she is doing well.  Eating well.  Watches her diet.  Sugars doing well.  a1c 6.5 - 07/07/16.  Discussed lab results.  She feels good.  Stays active.  No chest pain.  No sob.  No acid reflux.  No abdominal pain or cramping.  Bowels stable.  Persistent foot lesion.  Has been followed by podiatry and wound clinic.     Past Medical History:  Diagnosis Date  . Allergy   . Chicken pox   . CVA (cerebral vascular accident) ([place])   . Diabetes mellitus ([place])   . Hypercholesterolemia   . Hypertension    Past Surgical History:  Procedure Laterality Date  . ABDOMINAL HYSTERECTOMY  1971   excessive bleeding  . APPENDECTOMY  age 33  . BREAST LUMPECTOMY  1958   benign  . DILATION AND CURETTAGE OF UTERUS  1970   Family History  Problem Relation Age of Onset  . Liver cancer Father   . Stroke Mother   . Breast cancer Neg Hx   . Colon cancer Neg Hx    Social History   Social History  . Marital status: Widowed    Spouse name: N/A  . Number of children: 3  . Years of education: N/A   Social History Main Topics  . Smoking status: Never Smoker  . Smokeless tobacco: Never Used  . Alcohol use No  . Drug use: No  . Sexual activity: Not Asked   Other Topics Concern  . None   Social History Narrative  . None    Outpatient Encounter Prescriptions as of 07/09/2016  Medication Sig  . amLODipine (NORVASC) 5 MG tablet Take 1 tablet by mouth two  times daily  . clopidogrel (PLAVIX) 75 MG tablet Take 1 tablet by mouth  every day  . Lancet Devices (ONE TOUCH DELICA LANCING DEV) MISC Use twice daily Dx: 250.00  . Multiple Vitamin (MULTIVITAMIN) tablet Take 1 tablet by mouth daily.  .  mupirocin ointment (BACTROBAN) 2 % Place 1 application into the nose 2 (two) times daily.  . ONE TOUCH ULTRA TEST test strip TEST BG TWICE DAILY  . timolol (BETIMOL) 0.5 % ophthalmic solution 1 drop 2 (two) times daily.  . timolol (TIMOPTIC) 0.5 % ophthalmic solution   . metFORMIN (GLUCOPHAGE XR) 500 MG 24 hr tablet Take 1 tablet (500 mg total) by mouth 2 (two) times daily before a meal.   No facility-administered encounter medications on file as of 07/09/2016.     Review of Systems  Constitutional: Negative for appetite change and unexpected weight change.  HENT: Negative for congestion and sinus pain.   Respiratory: Negative for cough, chest tightness and shortness of breath.   Cardiovascular: Negative for chest pain, palpitations and leg swelling.  Gastrointestinal: Negative for abdominal pain, diarrhea, nausea and vomiting.  Genitourinary: Negative for difficulty urinating and dysuria.  Musculoskeletal: Negative for back pain and joint swelling.  Skin: Negative for color change and rash.  Neurological: Negative for dizziness, light-headedness and headaches.  Psychiatric/Behavioral: Negative for agitation and dysphoric mood.       Objective:    Physical Exam  Constitutional: She appears well-developed and well-nourished. No distress.  HENT:  Nose: Nose normal.  Mouth/Throat: Oropharynx is clear and moist.  Neck: Neck supple. No thyromegaly present.  Cardiovascular: Normal rate and regular rhythm.   Pulmonary/Chest: Breath sounds normal. No respiratory distress. She has no wheezes.  Abdominal: Soft. Bowel sounds are normal. There is no tenderness.  Musculoskeletal: She exhibits no edema or tenderness.  Lymphadenopathy:    She has no cervical adenopathy.  Skin: No rash noted. No erythema.  Psychiatric: She has a normal mood and affect. Her behavior is normal.    BP (!) 148/68   Pulse 71   Temp 97.5 F (36.4 C) (Oral)   Ht 5' 5"  (1.651 m)   Wt 106 lb 3.2 oz (48.2 kg)    LMP 07/21/1966   SpO2 94%   BMI 17.67 kg/m  Wt Readings from Last 3 Encounters:  07/09/16 106 lb 3.2 oz (48.2 kg)  02/20/16 104 lb 2 oz (47.2 kg)  11/12/15 103 lb 9.6 oz (47 kg)     Lab Results  Component Value Date   WBC 7.8 07/07/2016   HGB 14.1 07/07/2016   HCT 41.6 07/07/2016   PLT 157.0 07/07/2016   GLUCOSE 138 (H) 07/07/2016   CHOL 165 07/07/2016   TRIG 140.0 07/07/2016   HDL 46.90 07/07/2016   LDLDIRECT 66.4 05/24/2013   LDLCALC 90 07/07/2016   ALT 11 07/07/2016   AST 16 07/07/2016   NA 141 07/07/2016   K 4.2 07/07/2016   CL 104 07/07/2016   CREATININE 0.58 07/07/2016   BUN 15 07/07/2016   CO2 30 07/07/2016   TSH 3.46 11/26/2015   HGBA1C 6.5 07/07/2016   MICROALBUR 8.6 (H) 11/26/2015    Dg Foot Complete Left  Result Date: 07/30/2015 CLINICAL DATA:  Nonhealing wound. EXAM: LEFT FOOT - COMPLETE 3+ VIEW COMPARISON:  MRI 10/09/2014. FINDINGS: Diffuse osteopenia and degenerative change. No acute bony abnormality identified. A a wound with a bandage is noted over the plantar aspect of the distal foot. No adjacent acute bony abnormality. If osteomyelitis is of concern MRI can be obtained. IMPRESSION: Soft tissue wound with a bandage is noted over the plantar aspect of the distal left foot. Diffuse osteopenia degenerative change. No acute bony abnormality. Electronically Signed   By: Marcello Moores  Register   On: 07/30/2015 15:46       Assessment & Plan:   Problem List Items Addressed This Visit    History of CVA (cerebrovascular accident)    On plavix and doing well.  Follow.       Hypertension    Blood pressure slightly elevated on checks in the office.  Overall under reasonable control.  Follow pressures.  Follow metabolic panel.       Open wound of foot excluding toes without complication    Has been followed by wound care and podiatry.  Follow.        Pure hypercholesterolemia    Follow lipid panel and liver function tests.  Not on medication now.   Lab Results   Component Value Date   CHOL 165 07/07/2016   HDL 46.90 07/07/2016   LDLCALC 90 07/07/2016   LDLDIRECT 66.4 05/24/2013   TRIG 140.0 07/07/2016   CHOLHDL 4 07/07/2016        Relevant Orders   Lipid panel   Hepatic function panel   Thrombocytopenia ([facility])    Platelet count 07/07/16 - wnl.  Follow.       Type 2 diabetes mellitus ([place])  a1c 6.5.  Sugars doing well.  No low sugars.  Continue same medication regimen.  Follow met b and a1c.       Relevant Orders   Hemoglobin D8E   Basic metabolic panel   Weight loss    Weight up a couple of pounds from the last check.  Eating well.  Follow.            Einar Pheasant, MD

## 2016-07-09 NOTE — Progress Notes (Signed)
Pre visit review using our clinic review tool, if applicable. No additional management support is needed unless otherwise documented below in the visit note. 

## 2016-07-15 ENCOUNTER — Encounter: Payer: Self-pay | Admitting: Internal Medicine

## 2016-07-15 NOTE — Assessment & Plan Note (Signed)
Has been followed by wound care and podiatry.  Follow.

## 2016-07-15 NOTE — Assessment & Plan Note (Signed)
a1c 6.5.  Sugars doing well.  No low sugars.  Continue same medication regimen.  Follow met b and a1c.

## 2016-07-15 NOTE — Assessment & Plan Note (Signed)
Weight up a couple of pounds from the last check.  Eating well.  Follow.

## 2016-07-15 NOTE — Assessment & Plan Note (Signed)
Follow lipid panel and liver function tests.  Not on medication now.   Lab Results  Component Value Date   CHOL 165 07/07/2016   HDL 46.90 07/07/2016   LDLCALC 90 07/07/2016   LDLDIRECT 66.4 05/24/2013   TRIG 140.0 07/07/2016   CHOLHDL 4 07/07/2016

## 2016-07-15 NOTE — Assessment & Plan Note (Signed)
Platelet count 07/07/16 - wnl.  Follow.

## 2016-07-15 NOTE — Assessment & Plan Note (Signed)
On plavix and doing well.  Follow.   

## 2016-07-15 NOTE — Assessment & Plan Note (Signed)
Blood pressure slightly elevated on checks in the office.  Overall under reasonable control.  Follow pressures.  Follow metabolic panel.

## 2016-09-03 ENCOUNTER — Other Ambulatory Visit: Payer: Self-pay | Admitting: Internal Medicine

## 2016-10-14 ENCOUNTER — Other Ambulatory Visit (INDEPENDENT_AMBULATORY_CARE_PROVIDER_SITE_OTHER): Payer: Medicare Other

## 2016-10-14 DIAGNOSIS — E11621 Type 2 diabetes mellitus with foot ulcer: Secondary | ICD-10-CM

## 2016-10-14 DIAGNOSIS — L97509 Non-pressure chronic ulcer of other part of unspecified foot with unspecified severity: Secondary | ICD-10-CM

## 2016-10-14 DIAGNOSIS — E7800 Pure hypercholesterolemia, unspecified: Secondary | ICD-10-CM | POA: Diagnosis not present

## 2016-10-14 LAB — BASIC METABOLIC PANEL
BUN: 14 mg/dL (ref 6–23)
CO2: 30 mEq/L (ref 19–32)
Calcium: 9.6 mg/dL (ref 8.4–10.5)
Chloride: 105 mEq/L (ref 96–112)
Creatinine, Ser: 0.59 mg/dL (ref 0.40–1.20)
GFR: 100.97 mL/min (ref >60.00)
Glucose, Bld: 127 mg/dL — ABNORMAL HIGH (ref 70–99)
Potassium: 4.5 mEq/L (ref 3.5–5.1)
Sodium: 141 mEq/L (ref 135–145)

## 2016-10-14 LAB — HEPATIC FUNCTION PANEL
ALT: 10 U/L (ref 0–35)
AST: 16 U/L (ref 0–37)
Albumin: 4.3 g/dL (ref 3.5–5.2)
Alkaline Phosphatase: 60 U/L (ref 39–117)
Bilirubin, Direct: 0.1 mg/dL (ref 0.0–0.3)
Total Bilirubin: 0.7 mg/dL (ref 0.2–1.2)
Total Protein: 7.1 g/dL (ref 6.0–8.3)

## 2016-10-14 LAB — LIPID PANEL
Cholesterol: 168 mg/dL (ref 0–200)
HDL: 46.7 mg/dL (ref >39.00)
LDL Cholesterol: 94 mg/dL (ref 0–99)
NonHDL: 121.43
Total CHOL/HDL Ratio: 4
Triglycerides: 139 mg/dL (ref 0.0–149.0)
VLDL: 27.8 mg/dL (ref 0.0–40.0)

## 2016-10-14 LAB — HEMOGLOBIN A1C: Hgb A1c MFr Bld: 6.5 % (ref 4.6–6.5)

## 2016-10-15 ENCOUNTER — Encounter: Payer: Self-pay | Admitting: Internal Medicine

## 2016-10-19 ENCOUNTER — Encounter: Payer: Self-pay | Admitting: Internal Medicine

## 2016-10-19 ENCOUNTER — Ambulatory Visit (INDEPENDENT_AMBULATORY_CARE_PROVIDER_SITE_OTHER): Payer: Medicare Other | Admitting: Internal Medicine

## 2016-10-19 VITALS — BP 130/76 | HR 74 | Temp 98.4°F | Resp 16 | Ht 64.0 in | Wt 106.0 lb

## 2016-10-19 DIAGNOSIS — Z0000 Encounter for general adult medical examination without abnormal findings: Secondary | ICD-10-CM

## 2016-10-19 DIAGNOSIS — E7800 Pure hypercholesterolemia, unspecified: Secondary | ICD-10-CM

## 2016-10-19 DIAGNOSIS — S91309D Unspecified open wound, unspecified foot, subsequent encounter: Secondary | ICD-10-CM

## 2016-10-19 DIAGNOSIS — E1140 Type 2 diabetes mellitus with diabetic neuropathy, unspecified: Secondary | ICD-10-CM

## 2016-10-19 DIAGNOSIS — Z8673 Personal history of transient ischemic attack (TIA), and cerebral infarction without residual deficits: Secondary | ICD-10-CM | POA: Diagnosis not present

## 2016-10-19 DIAGNOSIS — I10 Essential (primary) hypertension: Secondary | ICD-10-CM

## 2016-10-19 DIAGNOSIS — R634 Abnormal weight loss: Secondary | ICD-10-CM

## 2016-10-19 DIAGNOSIS — D696 Thrombocytopenia, unspecified: Secondary | ICD-10-CM

## 2016-10-19 NOTE — Progress Notes (Signed)
Pre-visit discussion using our clinic review tool. No additional management support is needed unless otherwise documented below in the visit note.  

## 2016-10-19 NOTE — Progress Notes (Signed)
Patient ID: Angela Patterson, female   DOB: August 21, 1920, 83 y.o.   MRN: [id number]   Subjective:    Patient ID: Angela Patterson, female    DOB: June 23, 1921, 83 y.o.   MRN: [id number]  HPI  Patient here for her physical exam.   She is accompanied by her daughter.  History obtained from both of them.  She reports she is doing well.  Stays active.  No chest pain.  No sob.  No acid reflux.  No abdominal pain.  Bowels moving.  Sugars under good control.  a1c 6.5.  Blood pressure has been dong well.  Overall feels good.  Still with issues with her foot.  Has seen podiatry and wound clinic.  Stable.     Past Medical History:  Diagnosis Date  . Allergy   . Chicken pox   . CVA (cerebral vascular accident) ([place])   . Diabetes mellitus ([place])   . Hypercholesterolemia   . Hypertension    Past Surgical History:  Procedure Laterality Date  . ABDOMINAL HYSTERECTOMY  1971   excessive bleeding  . APPENDECTOMY  age 75  . BREAST LUMPECTOMY  1958   benign  . DILATION AND CURETTAGE OF UTERUS  1970   Family History  Problem Relation Age of Onset  . Liver cancer Father   . Stroke Mother   . Breast cancer Neg Hx   . Colon cancer Neg Hx    Social History   Social History  . Marital status: Widowed    Spouse name: N/A  . Number of children: 3  . Years of education: N/A   Social History Main Topics  . Smoking status: Never Smoker  . Smokeless tobacco: Never Used  . Alcohol use No  . Drug use: No  . Sexual activity: Not Asked   Other Topics Concern  . None   Social History Narrative  . None    Outpatient Encounter Prescriptions as of 10/19/2016  Medication Sig  . amLODipine (NORVASC) 5 MG tablet Take 1 tablet (5 mg total) by mouth 2 (two) times daily.  . clopidogrel (PLAVIX) 75 MG tablet Take 1 tablet (75 mg total) by mouth daily.  [place] [employer] Devices (ONE TOUCH DELICA LANCING DEV) MISC Use twice daily Dx: 250.00  . metFORMIN (GLUCOPHAGE-XR) 500 MG 24 hr tablet TAKE 1 TABLET BY MOUTH 2  TIMES DAILY  BEFORE A MEAL.  . Multiple Vitamin (MULTIVITAMIN) tablet Take 1 tablet by mouth daily.  . mupirocin ointment (BACTROBAN) 2 % Place 1 application into the nose 2 (two) times daily.  . ONE TOUCH ULTRA TEST test strip TEST BG TWICE DAILY  . timolol (BETIMOL) 0.5 % ophthalmic solution 1 drop 2 (two) times daily.  . timolol (TIMOPTIC) 0.5 % ophthalmic solution   . metFORMIN (GLUCOPHAGE XR) 500 MG 24 hr tablet Take 1 tablet (500 mg total) by mouth 2 (two) times daily before a meal.  . [DISCONTINUED] amLODipine (NORVASC) 5 MG tablet TAKE 1 TABLET BY MOUTH TWO  TIMES DAILY  . [DISCONTINUED] clopidogrel (PLAVIX) 75 MG tablet TAKE 1 TABLET BY MOUTH  EVERY DAY  . [DISCONTINUED] metFORMIN (GLUCOPHAGE-XR) 500 MG 24 hr tablet TAKE 1 TABLET BY MOUTH 2  TIMES DAILY BEFORE A MEAL.   No facility-administered encounter medications on file as of 10/19/2016.     Review of Systems  Constitutional: Negative for appetite change, fatigue and unexpected weight change.  HENT: Negative for congestion, sinus pressure and sore throat.   Eyes: Negative for pain and  visual disturbance.  Respiratory: Negative for cough, chest tightness and shortness of breath.   Cardiovascular: Negative for chest pain, palpitations and leg swelling.  Gastrointestinal: Negative for abdominal pain, diarrhea, nausea and vomiting.  Genitourinary: Negative for difficulty urinating and dysuria.  Musculoskeletal: Negative for back pain and joint swelling.  Skin: Negative for color change and rash.  Neurological: Negative for dizziness, light-headedness and headaches.  Hematological: Negative for adenopathy. Does not bruise/bleed easily.  Psychiatric/Behavioral: Negative for agitation and dysphoric mood.       Objective:    Physical Exam  Constitutional: She is oriented to person, place, and time. She appears well-developed and well-nourished. No distress.  HENT:  Nose: Nose normal.  Mouth/Throat: Oropharynx is clear and moist.  Eyes:  Right eye exhibits no discharge. Left eye exhibits no discharge. No scleral icterus.  Neck: Neck supple. No thyromegaly present.  Cardiovascular: Normal rate and regular rhythm.   Pulmonary/Chest: Breath sounds normal. No accessory muscle usage. No tachypnea. No respiratory distress. She has no decreased breath sounds. She has no wheezes. She has no rhonchi. Right breast exhibits no inverted nipple, no mass, no nipple discharge and no tenderness (no axillary adenopathy). Left breast exhibits no inverted nipple, no mass, no nipple discharge and no tenderness (no axilarry adenopathy).  Abdominal: Soft. Bowel sounds are normal. There is no tenderness.  Musculoskeletal: She exhibits no edema or tenderness.  Lymphadenopathy:    She has no cervical adenopathy.  Neurological: She is alert and oriented to person, place, and time.  Skin: Skin is warm. No rash noted. No erythema.  Psychiatric: She has a normal mood and affect. Her behavior is normal.    BP 130/76 (BP Location: Left Arm, Patient Position: Sitting, Cuff Size: Normal)   Pulse 74   Temp 98.4 F (36.9 C) (Oral)   Resp 16   Ht 5' 4"  (1.626 m)   Wt 106 lb (48.1 kg)   LMP 07/21/1966   SpO2 99%   BMI 18.19 kg/m  Wt Readings from Last 3 Encounters:  10/19/16 106 lb (48.1 kg)  07/09/16 106 lb 3.2 oz (48.2 kg)  02/20/16 104 lb 2 oz (47.2 kg)     Lab Results  Component Value Date   WBC 7.8 07/07/2016   HGB 14.1 07/07/2016   HCT 41.6 07/07/2016   PLT 157.0 07/07/2016   GLUCOSE 127 (H) 10/14/2016   CHOL 168 10/14/2016   TRIG 139.0 10/14/2016   HDL 46.70 10/14/2016   LDLDIRECT 66.4 05/24/2013   LDLCALC 94 10/14/2016   ALT 10 10/14/2016   AST 16 10/14/2016   NA 141 10/14/2016   K 4.5 10/14/2016   CL 105 10/14/2016   CREATININE 0.59 10/14/2016   BUN 14 10/14/2016   CO2 30 10/14/2016   TSH 3.46 11/26/2015   HGBA1C 6.5 10/14/2016   MICROALBUR 8.6 (H) 11/26/2015        Assessment & Plan:   Problem List Items Addressed  This Visit    Diabetes mellitus ([place])    Sugars under good control.  Same medication regimen.  Follow met b and a1c.   Lab Results  Component Value Date   HGBA1C 6.5 10/14/2016        Relevant Medications   metFORMIN (GLUCOPHAGE-XR) 500 MG 24 hr tablet   Other Relevant Orders   Hemoglobin A1c   Microalbumin / creatinine urine ratio   Health care maintenance    Physical today 10/19/16.  Declines mammogram and colon evaluation.  History of CVA (cerebrovascular accident)    On plavix and doing well.  Follow.        Hypercholesterolemia    Follow lipid panel.  Lab Results  Component Value Date   CHOL 168 10/14/2016   HDL 46.70 10/14/2016   LDLCALC 94 10/14/2016   LDLDIRECT 66.4 05/24/2013   TRIG 139.0 10/14/2016   CHOLHDL 4 10/14/2016        Relevant Medications   amLODipine (NORVASC) 5 MG tablet   Other Relevant Orders   Hepatic function panel   Lipid panel   Hypertension    Blood pressure under good control.  Continue same medication regimen.  Follow pressures.  Follow metabolic panel.        Relevant Medications   amLODipine (NORVASC) 5 MG tablet   Other Relevant Orders   TSH   Basic metabolic panel   Open wound of foot excluding toes without complication    Has been worked up by wound care and podiatry.  Stable.        Thrombocytopenia ([facility])    Platelet count wnl 06/2016.        Weight loss    Weight stable from previous check.  Follow.         Other Visit Diagnoses    Routine general medical examination at a health care facility    -  Primary       Einar Pheasant, MD

## 2016-10-25 ENCOUNTER — Encounter: Payer: Self-pay | Admitting: Internal Medicine

## 2016-10-25 MED ORDER — CLOPIDOGREL BISULFATE 75 MG PO TABS: 75.0000 mg | ORAL_TABLET | Freq: Every day | ORAL | 1 refills | Status: DC

## 2016-10-25 MED ORDER — AMLODIPINE BESYLATE 5 MG PO TABS: 5.0000 mg | ORAL_TABLET | Freq: Two times a day (BID) | ORAL | 1 refills | Status: DC

## 2016-10-25 MED ORDER — METFORMIN HCL ER 500 MG PO TB24: ORAL_TABLET | ORAL | 1 refills | Status: DC

## 2016-10-25 NOTE — Assessment & Plan Note (Signed)
Sugars under good control.  Same medication regimen.  Follow met b and a1c.   Lab Results  Component Value Date   HGBA1C 6.5 10/14/2016

## 2016-10-25 NOTE — Assessment & Plan Note (Signed)
Has been worked up by wound care and podiatry.  Stable.

## 2016-10-25 NOTE — Assessment & Plan Note (Signed)
Physical today 10/19/16.  Declines mammogram and colon evaluation.

## 2016-10-25 NOTE — Assessment & Plan Note (Signed)
Blood pressure under good control.  Continue same medication regimen.  Follow pressures.  Follow metabolic panel.   

## 2016-10-25 NOTE — Assessment & Plan Note (Signed)
Weight stable from previous check.  Follow.  

## 2016-10-25 NOTE — Assessment & Plan Note (Signed)
Platelet count wnl 06/2016.

## 2016-10-25 NOTE — Assessment & Plan Note (Signed)
Follow lipid panel.  Lab Results  Component Value Date   CHOL 168 10/14/2016   HDL 46.70 10/14/2016   LDLCALC 94 10/14/2016   LDLDIRECT 66.4 05/24/2013   TRIG 139.0 10/14/2016   CHOLHDL 4 10/14/2016

## 2016-10-25 NOTE — Assessment & Plan Note (Signed)
On plavix and doing well.  Follow.   

## 2016-12-08 DIAGNOSIS — H401132 Primary open-angle glaucoma, bilateral, moderate stage: Secondary | ICD-10-CM | POA: Diagnosis not present

## 2016-12-15 ENCOUNTER — Encounter: Payer: Self-pay | Admitting: Internal Medicine

## 2016-12-15 DIAGNOSIS — E119 Type 2 diabetes mellitus without complications: Secondary | ICD-10-CM | POA: Diagnosis not present

## 2016-12-15 LAB — HM DIABETES EYE EXAM

## 2017-01-04 ENCOUNTER — Other Ambulatory Visit: Payer: Self-pay | Admitting: Internal Medicine

## 2017-02-08 IMAGING — CR DG FOOT COMPLETE 3+V*L*
1 series · 3 of 3 positions shown · non-contrast
Comparison: None.

CLINICAL DATA: Nonhealing wound left foot.

EXAM:
LEFT FOOT - COMPLETE 3+ VIEW

[Series 1: x foot ap left · 0.14mm/px · 3 of 3 slices shown]
[im 1/3]
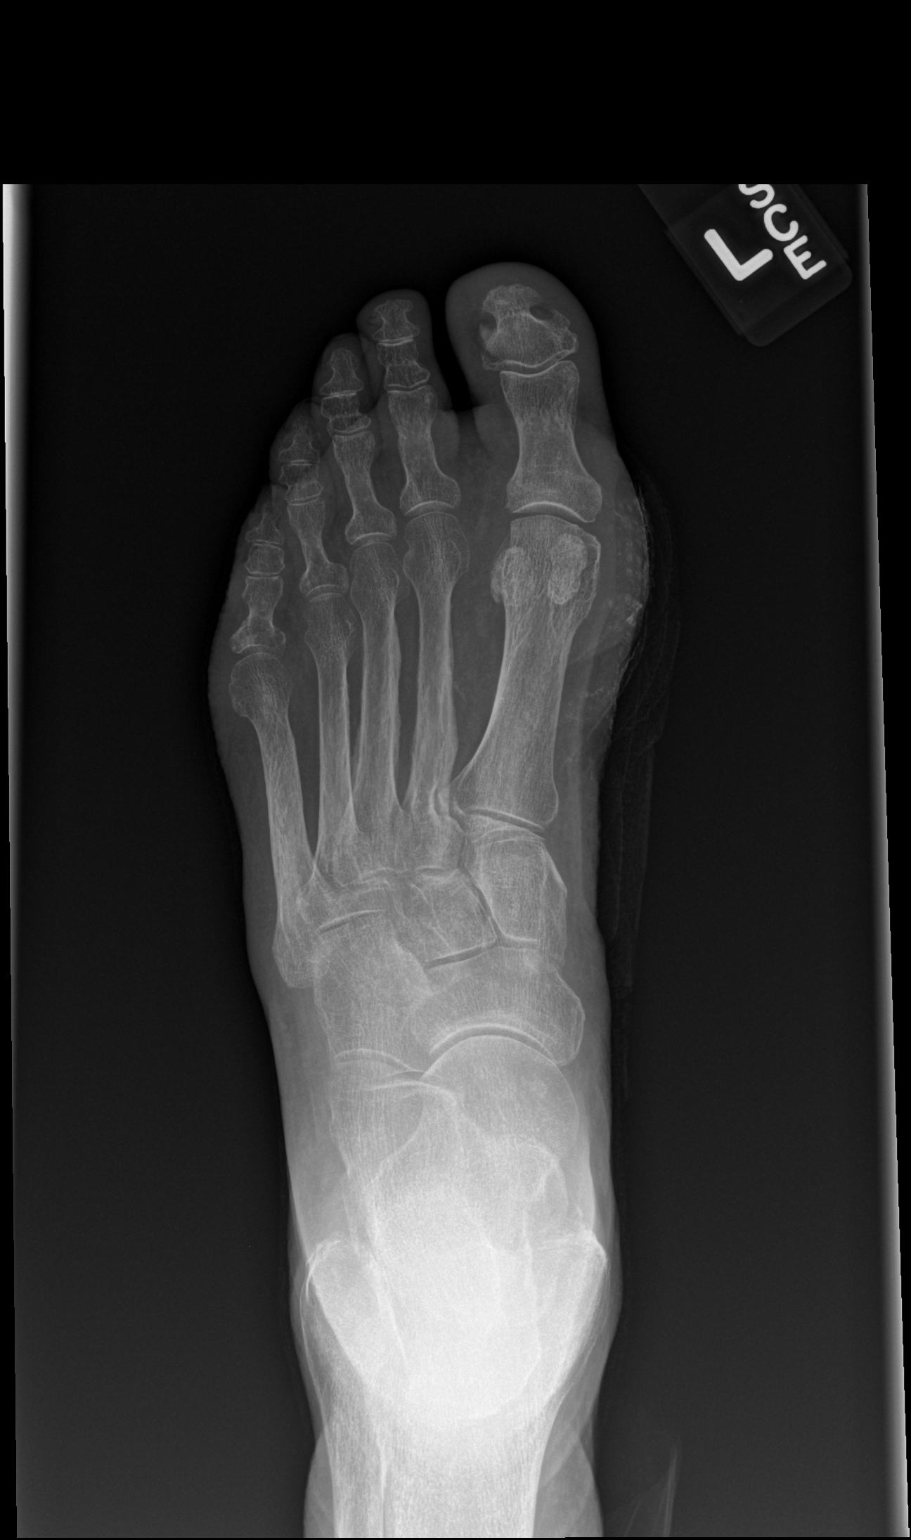
[im 2/3]
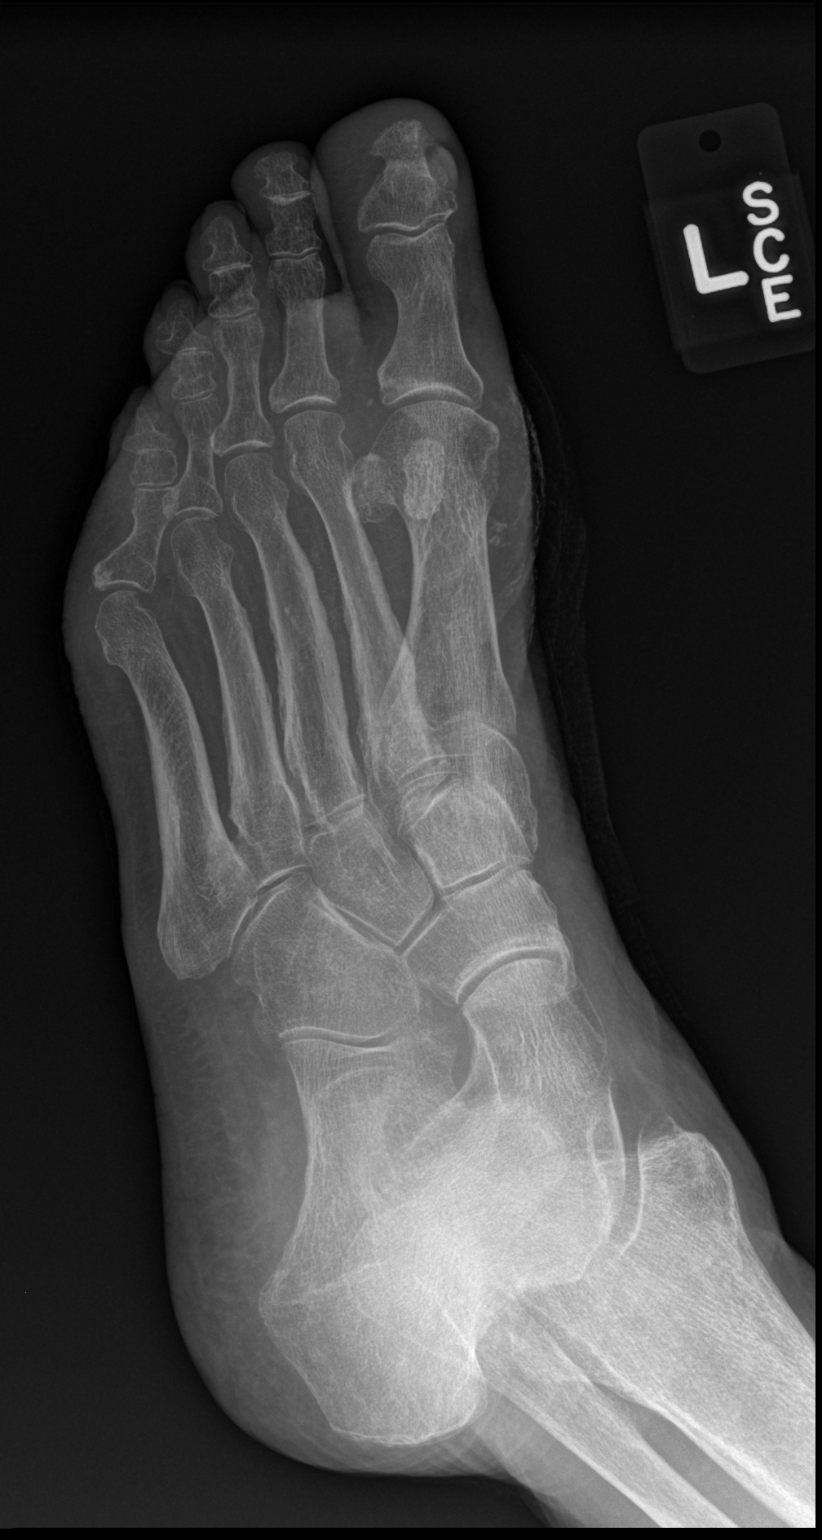
[im 3/3]
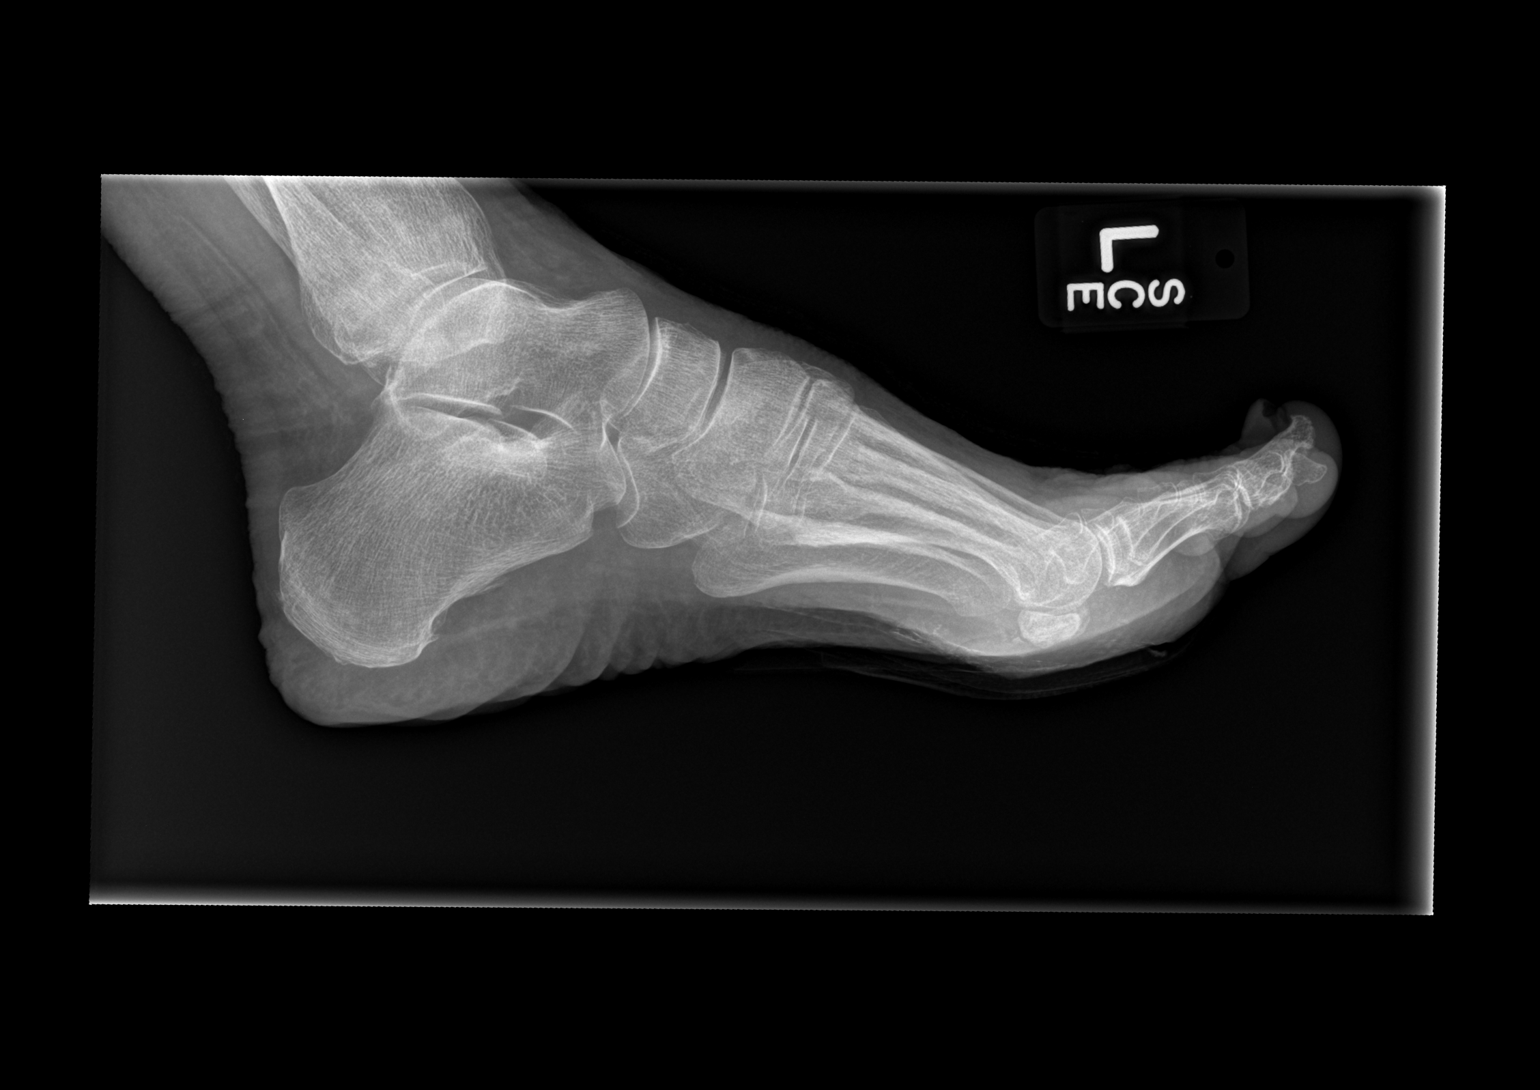

[3 of 3 positions shown; findings below may reference images not displayed]

FINDINGS: Diffuse osteopenia degenerative change. Soft tissue swelling is
noted adjacent to the medial aspect of the distal first metatarsal.
Overlying bandage is noted. Subtle osteopenia the distal left first
metatarsal. MRI of the left foot is suggested to further evaluate
for osteomyelitis.
IMPRESSION: Soft tissue swelling noted medial aspect of the left first
metatarsal. No adjacent bony cortical erosions are noted. Noted
however is mild osteopenia distal first metatarsal. MRI of the left
foot should be considered to exclude osteomyelitis.

## 2017-02-22 ENCOUNTER — Other Ambulatory Visit (INDEPENDENT_AMBULATORY_CARE_PROVIDER_SITE_OTHER): Payer: Medicare Other

## 2017-02-22 ENCOUNTER — Encounter: Payer: Self-pay | Admitting: Internal Medicine

## 2017-02-22 DIAGNOSIS — I10 Essential (primary) hypertension: Secondary | ICD-10-CM | POA: Diagnosis not present

## 2017-02-22 DIAGNOSIS — E7800 Pure hypercholesterolemia, unspecified: Secondary | ICD-10-CM | POA: Diagnosis not present

## 2017-02-22 DIAGNOSIS — E1140 Type 2 diabetes mellitus with diabetic neuropathy, unspecified: Secondary | ICD-10-CM | POA: Diagnosis not present

## 2017-02-22 LAB — BASIC METABOLIC PANEL
BUN: 17 mg/dL (ref 6–23)
CO2: 30 mEq/L (ref 19–32)
Calcium: 9.5 mg/dL (ref 8.4–10.5)
Chloride: 104 mEq/L (ref 96–112)
Creatinine, Ser: 0.68 mg/dL (ref 0.40–1.20)
GFR: 85.64 mL/min (ref >60.00)
Glucose, Bld: 143 mg/dL — ABNORMAL HIGH (ref 70–99)
Potassium: 4.2 mEq/L (ref 3.5–5.1)
Sodium: 141 mEq/L (ref 135–145)

## 2017-02-22 LAB — TSH: TSH: 3.06 u[IU]/mL (ref 0.35–4.50)

## 2017-02-22 LAB — LIPID PANEL
Cholesterol: 158 mg/dL (ref 0–200)
HDL: 48.8 mg/dL (ref >39.00)
LDL Cholesterol: 81 mg/dL (ref 0–99)
NonHDL: 109.21
Total CHOL/HDL Ratio: 3
Triglycerides: 142 mg/dL (ref 0.0–149.0)
VLDL: 28.4 mg/dL (ref 0.0–40.0)

## 2017-02-22 LAB — HEPATIC FUNCTION PANEL
ALT: 10 U/L (ref 0–35)
AST: 17 U/L (ref 0–37)
Albumin: 4.2 g/dL (ref 3.5–5.2)
Alkaline Phosphatase: 52 U/L (ref 39–117)
Bilirubin, Direct: 0.1 mg/dL (ref 0.0–0.3)
Total Bilirubin: 0.8 mg/dL (ref 0.2–1.2)
Total Protein: 7.5 g/dL (ref 6.0–8.3)

## 2017-02-22 LAB — HEMOGLOBIN A1C: Hgb A1c MFr Bld: 6.7 % — ABNORMAL HIGH (ref 4.6–6.5)

## 2017-02-22 NOTE — Addendum Note (Signed)
Addended by: Leeanne Rio on: 02/22/2017 08:41 AM   Modules accepted: Orders

## 2017-02-24 ENCOUNTER — Ambulatory Visit (INDEPENDENT_AMBULATORY_CARE_PROVIDER_SITE_OTHER): Payer: Medicare Other | Admitting: Internal Medicine

## 2017-02-24 ENCOUNTER — Encounter: Payer: Self-pay | Admitting: Internal Medicine

## 2017-02-24 DIAGNOSIS — I10 Essential (primary) hypertension: Secondary | ICD-10-CM

## 2017-02-24 DIAGNOSIS — L97509 Non-pressure chronic ulcer of other part of unspecified foot with unspecified severity: Secondary | ICD-10-CM

## 2017-02-24 DIAGNOSIS — E7800 Pure hypercholesterolemia, unspecified: Secondary | ICD-10-CM

## 2017-02-24 DIAGNOSIS — Z8673 Personal history of transient ischemic attack (TIA), and cerebral infarction without residual deficits: Secondary | ICD-10-CM

## 2017-02-24 DIAGNOSIS — E11621 Type 2 diabetes mellitus with foot ulcer: Secondary | ICD-10-CM | POA: Diagnosis not present

## 2017-02-24 DIAGNOSIS — D696 Thrombocytopenia, unspecified: Secondary | ICD-10-CM | POA: Diagnosis not present

## 2017-02-24 DIAGNOSIS — E1140 Type 2 diabetes mellitus with diabetic neuropathy, unspecified: Secondary | ICD-10-CM | POA: Diagnosis not present

## 2017-02-24 DIAGNOSIS — R634 Abnormal weight loss: Secondary | ICD-10-CM | POA: Diagnosis not present

## 2017-02-24 LAB — MICROALBUMIN / CREATININE URINE RATIO
Creatinine,U: 110.5 mg/dL
Microalb Creat Ratio: 2.7 mg/g (ref 0.0–30.0)
Microalb, Ur: 3 mg/dL — ABNORMAL HIGH (ref 0.0–1.9)

## 2017-02-24 NOTE — Progress Notes (Signed)
Patient ID: Angela Patterson, female   DOB: 1921/02/12, 83 y.o.   MRN: [id number]   Subjective:    Patient ID: Angela Patterson, female    DOB: November 17, 1920, 83 y.o.   MRN: [id number]  HPI  Patient here for a scheduled follow up.  She is accompanied by her daughter (Dot).   History obtained from both of them.  She reports she is doing relatively well.  Stays active.  No chest pain.  No sob.  No acid reflux.  No abdominal pain.  Diarrhea is much better since changing the metformin to extended release.  She is still having some intermittent episodes of loose stool.  Occurs intermittently.  Discussed possible triggers.  She noticed recently it occurred after eating an ice cream sandwich.  Discussed monitoring for triggers.  Discussed recent lab results.  Reviewed her sugars.  a1c 6.7.  Overall she feels she is doing well.     Past Medical History:  Diagnosis Date  . Allergy   . Chicken pox   . CVA (cerebral vascular accident) ([place])   . Diabetes mellitus ([place])   . Hypercholesterolemia   . Hypertension    Past Surgical History:  Procedure Laterality Date  . ABDOMINAL HYSTERECTOMY  1971   excessive bleeding  . APPENDECTOMY  age 29  . BREAST LUMPECTOMY  1958   benign  . DILATION AND CURETTAGE OF UTERUS  1970   Family History  Problem Relation Age of Onset  . Liver cancer Father   . Stroke Mother   . Breast cancer Neg Hx   . Colon cancer Neg Hx    Social History   Social History  . Marital status: Widowed    Spouse name: N/A  . Number of children: 3  . Years of education: N/A   Social History Main Topics  . Smoking status: Never Smoker  . Smokeless tobacco: Never Used  . Alcohol use No  . Drug use: No  . Sexual activity: Not Asked   Other Topics Concern  . None   Social History Narrative  . None    Outpatient Encounter Prescriptions as of 02/24/2017  Medication Sig  . amLODipine (NORVASC) 5 MG tablet TAKE 1 TABLET BY MOUTH TWO  TIMES DAILY  . clopidogrel (PLAVIX) 75 MG tablet  TAKE 1 TABLET BY MOUTH  EVERY DAY  . Lancet Devices (ONE TOUCH DELICA LANCING DEV) MISC Use twice daily Dx: 250.00  . metFORMIN (GLUCOPHAGE-XR) 500 MG 24 hr tablet TAKE 1 TABLET BY MOUTH 2  TIMES DAILY BEFORE A MEAL.  . Multiple Vitamin (MULTIVITAMIN) tablet Take 1 tablet by mouth daily.  . ONE TOUCH ULTRA TEST test strip TEST BG TWICE DAILY  . timolol (BETIMOL) 0.5 % ophthalmic solution 1 drop 2 (two) times daily.  . timolol (TIMOPTIC) 0.5 % ophthalmic solution   . [DISCONTINUED] mupirocin ointment (BACTROBAN) 2 % Place 1 application into the nose 2 (two) times daily.  . [DISCONTINUED] metFORMIN (GLUCOPHAGE XR) 500 MG 24 hr tablet Take 1 tablet (500 mg total) by mouth 2 (two) times daily before a meal.  . [DISCONTINUED] metFORMIN (GLUCOPHAGE-XR) 500 MG 24 hr tablet TAKE 1 TABLET BY MOUTH 2  TIMES DAILY BEFORE A MEAL.   No facility-administered encounter medications on file as of 02/24/2017.     Review of Systems  Constitutional: Negative for appetite change.       Some minimal weight loss.    HENT: Negative for congestion and sinus pressure.   Respiratory: Negative for  cough, chest tightness and shortness of breath.   Cardiovascular: Negative for chest pain, palpitations and leg swelling.  Gastrointestinal: Positive for diarrhea. Negative for abdominal pain, nausea and vomiting.  Genitourinary: Negative for difficulty urinating and dysuria.  Musculoskeletal: Negative for back pain and joint swelling.  Skin: Negative for color change and rash.  Neurological: Negative for dizziness, light-headedness and headaches.  Psychiatric/Behavioral: Negative for agitation and dysphoric mood.       Objective:    Physical Exam  Constitutional: She appears well-developed and well-nourished. No distress.  HENT:  Nose: Nose normal.  Mouth/Throat: Oropharynx is clear and moist.  Neck: Neck supple. No thyromegaly present.  Cardiovascular: Normal rate and regular rhythm.   Pulmonary/Chest: Breath  sounds normal. No respiratory distress. She has no wheezes.  Abdominal: Soft. Bowel sounds are normal. There is no tenderness.  Musculoskeletal: She exhibits no edema or tenderness.  Lymphadenopathy:    She has no cervical adenopathy.  Skin: No rash noted. No erythema.  Psychiatric: She has a normal mood and affect. Her behavior is normal.    BP 134/78 (BP Location: Left Arm, Patient Position: Sitting, Cuff Size: Normal)   Pulse 84   Temp (!) 97.5 F (36.4 C) (Oral)   Resp 12   Ht _0  (1.626 m)   Wt 104 lb 9.6 oz (47.4 kg)   LMP 07/21/1966   SpO2 95%   BMI 17.95 kg/m  Wt Readings from Last 3 Encounters:  02/24/17 104 lb 9.6 oz (47.4 kg)  10/19/16 106 lb (48.1 kg)  07/09/16 106 lb 3.2 oz (48.2 kg)     Lab Results  Component Value Date   WBC 7.8 07/07/2016   HGB 14.1 07/07/2016   HCT 41.6 07/07/2016   PLT 157.0 07/07/2016   GLUCOSE 143 (H) 02/22/2017   CHOL 158 02/22/2017   TRIG 142.0 02/22/2017   HDL 48.80 02/22/2017   LDLDIRECT 66.4 05/24/2013   LDLCALC 81 02/22/2017   ALT 10 02/22/2017   AST 17 02/22/2017   NA 141 02/22/2017   K 4.2 02/22/2017   CL 104 02/22/2017   CREATININE 0.68 02/22/2017   BUN 17 02/22/2017   CO2 30 02/22/2017   TSH 3.06 02/22/2017   HGBA1C 6.7 (H) 02/22/2017   MICROALBUR 3.0 (H) 02/24/2017       Assessment & Plan:   Problem List Items Addressed This Visit    Diabetes mellitus ([place])   Relevant Orders   Hemoglobin A1c   History of CVA (cerebrovascular accident)    On plavix and doing well.  Follow.        Hypercholesterolemia    Follow lipid panel.   Lab Results  Component Value Date   CHOL 158 02/22/2017   HDL 48.80 02/22/2017   LDLCALC 81 02/22/2017   LDLDIRECT 66.4 05/24/2013   TRIG 142.0 02/22/2017   CHOLHDL 3 02/22/2017        Relevant Orders   Hepatic function panel   Lipid panel   Hypertension    Blood pressure under good control.  Continue same medication regimen.  Follow pressures.  Follow metabolic panel.         Relevant Orders   Basic metabolic panel   Thrombocytopenia (HCC)    Last platelet count wnl.       Type 2 diabetes mellitus ([place])    Reviewed labs.  a1c 6.7.  Watches her diet.  Stays active.  Follow met b and a1c.        Relevant Orders  Hemoglobin A1c   Weight loss    Weight down a couple of pounds from the last check.  Eats regular meals.  Follow.           Einar Pheasant, MD

## 2017-02-26 ENCOUNTER — Telehealth: Payer: Self-pay | Admitting: Internal Medicine

## 2017-02-26 ENCOUNTER — Telehealth: Payer: Self-pay | Admitting: *Deleted

## 2017-02-26 NOTE — Telephone Encounter (Signed)
Hard copy mailed  

## 2017-02-26 NOTE — Telephone Encounter (Signed)
Patient will need nurse visit scheduled to have ears irrigated. Thanks.

## 2017-02-26 NOTE — Assessment & Plan Note (Signed)
Weight down a couple of pounds from the last check.  Eats regular meals.  Follow.

## 2017-02-26 NOTE — Assessment & Plan Note (Signed)
Blood pressure under good control.  Continue same medication regimen.  Follow pressures.  Follow metabolic panel.   

## 2017-02-26 NOTE — Telephone Encounter (Signed)
Duplicate note. See previous phone note.

## 2017-02-26 NOTE — Telephone Encounter (Signed)
Patient's daughter stated that can not hear, due to having a wax build up in her ear. Her hearing aids can no longer be effective. Daughter requested to have a order to have pt's ears cleaned  Pt's daughter contact [name]   [phone]

## 2017-02-26 NOTE — Assessment & Plan Note (Signed)
Follow lipid panel.   Lab Results  Component Value Date   CHOL 158 02/22/2017   HDL 48.80 02/22/2017   LDLCALC 81 02/22/2017   LDLDIRECT 66.4 05/24/2013   TRIG 142.0 02/22/2017   CHOLHDL 3 02/22/2017

## 2017-02-26 NOTE — Assessment & Plan Note (Signed)
On plavix and doing well.  Follow.   

## 2017-02-26 NOTE — Assessment & Plan Note (Signed)
Reviewed labs.  a1c 6.7.  Watches her diet.  Stays active.  Follow met b and a1c.

## 2017-02-26 NOTE — Assessment & Plan Note (Signed)
Last platelet count wnl.   

## 2017-02-26 NOTE — Telephone Encounter (Signed)
Pt son called about pt needing both ears irrigated. Pt went to get her hearing aid checked and was told pt has wax build up in both ears. Order needed please and thank you!  Call pt @ [phone]. Thank you!

## 2017-03-02 ENCOUNTER — Encounter: Payer: Self-pay | Admitting: Family

## 2017-03-02 ENCOUNTER — Ambulatory Visit (INDEPENDENT_AMBULATORY_CARE_PROVIDER_SITE_OTHER): Payer: Medicare Other | Admitting: Family

## 2017-03-02 VITALS — BP 138/80 | HR 82 | Temp 97.8°F | Ht 64.0 in | Wt 103.0 lb

## 2017-03-02 DIAGNOSIS — H6123 Impacted cerumen, bilateral: Secondary | ICD-10-CM | POA: Diagnosis not present

## 2017-03-02 NOTE — Progress Notes (Signed)
Subjective:    Patient ID: Angela Patterson, female    DOB: October 27, 1920, 83 y.o.   MRN: [id number]  CC: Angela Patterson is a 83 y.o. female who presents today for an acute visit.    HPI: CC: Ear fullness x one week, unchanged.  No discharge, fever, acute changes in hearing, headache.  Accompanied by daughter who states here today as patient recently was fitted with hearing aids and states earwax buildup was affecting hearing aids.' would ike the ears to be irrigated.'  Seasonal allergies. Occasional clear runny nasal discharge. No medications  No fever, sob, wheezing  HTN- compliant with medication. Denies exertional chest pain or pressure, numbness or tingling radiating to left arm or jaw, palpitations, dizziness, frequent headaches, changes in vision, or shortness of breath.       HISTORY:  Past Medical History:  Diagnosis Date  . Allergy   . Chicken pox   . CVA (cerebral vascular accident) ([place])   . Diabetes mellitus ([place])   . Hypercholesterolemia   . Hypertension    Past Surgical History:  Procedure Laterality Date  . ABDOMINAL HYSTERECTOMY  1971   excessive bleeding  . APPENDECTOMY  age 59  . BREAST LUMPECTOMY  1958   benign  . DILATION AND CURETTAGE OF UTERUS  1970   Family History  Problem Relation Age of Onset  . Liver cancer Father   . Stroke Mother   . Breast cancer Neg Hx   . Colon cancer Neg Hx     Allergies: Actos [pioglitazone]; Contrast media [iodinated diagnostic agents]; Prandin [repaglinide]; and Pravastatin sodium Current Outpatient Prescriptions on File Prior to Visit  Medication Sig Dispense Refill  . amLODipine (NORVASC) 5 MG tablet TAKE 1 TABLET BY MOUTH TWO  TIMES DAILY 180 tablet 1  . clopidogrel (PLAVIX) 75 MG tablet TAKE 1 TABLET BY MOUTH  EVERY DAY 90 tablet 0  . Lancet Devices (ONE TOUCH DELICA LANCING DEV) MISC Use twice daily Dx: 250.00 100 each 5  . metFORMIN (GLUCOPHAGE-XR) 500 MG 24 hr tablet TAKE 1 TABLET BY MOUTH 2  TIMES DAILY  BEFORE A MEAL. 180 tablet 1  . Multiple Vitamin (MULTIVITAMIN) tablet Take 1 tablet by mouth daily.    . ONE TOUCH ULTRA TEST test strip TEST BG TWICE DAILY 100 each 6  . timolol (BETIMOL) 0.5 % ophthalmic solution 1 drop 2 (two) times daily.    . timolol (TIMOPTIC) 0.5 % ophthalmic solution      No current facility-administered medications on file prior to visit.     Social History  Substance Use Topics  . Smoking status: Never Smoker  . Smokeless tobacco: Never Used  . Alcohol use No    Review of Systems  Constitutional: Negative for chills and fever.  HENT: Positive for hearing loss. Negative for ear discharge, ear pain, facial swelling, sinus pain and sore throat.   Respiratory: Negative for cough.   Cardiovascular: Negative for chest pain and palpitations.  Gastrointestinal: Negative for nausea and vomiting.  Neurological: Negative for headaches.      Objective:    BP 138/80   Pulse 82   Temp 97.8 F (36.6 C) (Oral)   Ht 5\' 4"  (1.626 m)   Wt 103 lb (46.7 kg)   LMP 07/21/1966   SpO2 98%   BMI 17.68 kg/m    Physical Exam  Constitutional: She appears well-developed and well-nourished.  HENT:  Head: Normocephalic and atraumatic.  Right Ear: Tympanic membrane, external ear and ear canal  normal. No drainage, swelling or tenderness. No foreign bodies. Tympanic membrane is not erythematous and not bulging. No middle ear effusion. Decreased hearing is noted.  Left Ear: Tympanic membrane, external ear and ear canal normal. No drainage, swelling or tenderness. No foreign bodies. Tympanic membrane is not erythematous and not bulging.  No middle ear effusion. Decreased hearing is noted.  Nose: Nose normal. No rhinorrhea. Right sinus exhibits no maxillary sinus tenderness and no frontal sinus tenderness. Left sinus exhibits no maxillary sinus tenderness and no frontal sinus tenderness.  Mouth/Throat: Uvula is midline, oropharynx is clear and moist and mucous membranes are normal.  No oropharyngeal exudate, posterior oropharyngeal edema, posterior oropharyngeal erythema or tonsillar abscesses.  Moderate cerumen noted bilateral ear canals. Able to visualize TM.   Eyes: Conjunctivae are normal.  Cardiovascular: Regular rhythm, normal heart sounds and normal pulses.   Pulmonary/Chest: Effort normal and breath sounds normal. She has no wheezes. She has no rhonchi. She has no rales.  Lymphadenopathy:       Head (right side): No submental, no submandibular, no tonsillar, no preauricular, no posterior auricular and no occipital adenopathy present.       Head (left side): No submental, no submandibular, no tonsillar, no preauricular, no posterior auricular and no occipital adenopathy present.    She has no cervical adenopathy.  Neurological: She is alert.  Skin: Skin is warm and dry.  Psychiatric: She has a normal mood and affect. Her speech is normal and behavior is normal. Thought content normal.  Vitals reviewed.  Bilateral cerumen impactions, resolved with irrigation by CMA in both left and right ear.  After which, I also examined patient and the EAC's and TM's are clear. Patient tolerated procedure well.     Assessment & Plan:   1. Bilateral impacted cerumen Resolved. Advised OTC antihistamine for post nasal drip. Return precautions given.     I am having [name] maintain her multivitamin, timolol, ONE TOUCH DELICA LANCING DEV, timolol, ONE TOUCH ULTRA TEST, amLODipine, metFORMIN, and clopidogrel.   No orders of the defined types were placed in this encounter.   Return precautions given.   Risks, benefits, and alternatives of the medications and treatment plan prescribed today were discussed, and patient expressed understanding.   Education regarding symptom management and diagnosis given to patient on AVS.  Continue to follow with Einar Pheasant, MD for routine health maintenance.   Angela Patterson and I agreed with plan.   Mable Paris, FNP

## 2017-03-02 NOTE — Progress Notes (Signed)
Pre visit review using our clinic review tool, if applicable. No additional management support is needed unless otherwise documented below in the visit note. 

## 2017-03-02 NOTE — Patient Instructions (Signed)
Do not use Q-tips in the ear. Use Hydrogen Peroxide or Debrox drops weekly as needed for cerumen build up. Never stick anything into the ear canal. Return to clinic if thin puss drains out or if foul odor comes from ear.  May try zyrtec or nasal saline spray for allergies.   Pleasure meeting you!

## 2017-03-08 ENCOUNTER — Other Ambulatory Visit: Payer: Self-pay | Admitting: Internal Medicine

## 2017-03-09 ENCOUNTER — Other Ambulatory Visit: Payer: Self-pay | Admitting: Internal Medicine

## 2017-04-13 ENCOUNTER — Other Ambulatory Visit: Payer: Self-pay

## 2017-04-13 ENCOUNTER — Other Ambulatory Visit: Payer: Self-pay | Admitting: Internal Medicine

## 2017-04-13 MED ORDER — GLUCOSE BLOOD VI STRP: ORAL_STRIP | 3 refills | Status: DC

## 2017-04-16 ENCOUNTER — Encounter: Payer: Self-pay | Admitting: Internal Medicine

## 2017-04-16 ENCOUNTER — Ambulatory Visit (INDEPENDENT_AMBULATORY_CARE_PROVIDER_SITE_OTHER): Payer: Medicare Other

## 2017-04-16 ENCOUNTER — Ambulatory Visit (INDEPENDENT_AMBULATORY_CARE_PROVIDER_SITE_OTHER): Payer: Medicare Other | Admitting: Internal Medicine

## 2017-04-16 VITALS — BP 160/80 | HR 87 | Temp 99.8°F | Resp 16 | Wt 103.8 lb

## 2017-04-16 DIAGNOSIS — J069 Acute upper respiratory infection, unspecified: Secondary | ICD-10-CM

## 2017-04-16 DIAGNOSIS — R059 Cough, unspecified: Secondary | ICD-10-CM

## 2017-04-16 DIAGNOSIS — R05 Cough: Secondary | ICD-10-CM

## 2017-04-16 DIAGNOSIS — L97509 Non-pressure chronic ulcer of other part of unspecified foot with unspecified severity: Secondary | ICD-10-CM | POA: Diagnosis not present

## 2017-04-16 DIAGNOSIS — I10 Essential (primary) hypertension: Secondary | ICD-10-CM | POA: Diagnosis not present

## 2017-04-16 DIAGNOSIS — L97529 Non-pressure chronic ulcer of other part of left foot with unspecified severity: Secondary | ICD-10-CM

## 2017-04-16 DIAGNOSIS — E11621 Type 2 diabetes mellitus with foot ulcer: Secondary | ICD-10-CM | POA: Diagnosis not present

## 2017-04-16 MED ORDER — AMOXICILLIN-POT CLAVULANATE 500-125 MG PO TABS: 1.0000 | ORAL_TABLET | Freq: Two times a day (BID) | ORAL | 0 refills | Status: DC

## 2017-04-16 NOTE — Patient Instructions (Signed)
nasacort nasal spray - 2 sprays each nostril one time per day.  Do this in the evening.    Robitussin DM - twice a day as needed.

## 2017-04-16 NOTE — Progress Notes (Signed)
Patient ID: Angela Patterson, female   DOB: 08-31-1920, 83 y.o.   MRN: [id number]   Subjective:    Patient ID: Angela Patterson, female    DOB: 24-Feb-1921, 83 y.o.   MRN: [id number]  HPI  Patient here as a work in with concerns regarding cough and congestion.  She is accompanied by her son.  History obtained from both of them.  States symptoms started over the last two days.  Reports sore throat yesterday.  Better today.  Runny nose.  Increased cough.  Non productive.  No chest congestion.  No sob.  No wheezing or chest tightness.  No acid reflux.  No abdominal pian.  Bowels moving.  No urine change.  Son also concerned regarding persistent foot wound.  Has seen wound clinic locally.  Will improve, but does not resolve.  Worsened recently.     Past Medical History:  Diagnosis Date  . Allergy   . Chicken pox   . CVA (cerebral vascular accident) ([place])   . Diabetes mellitus ([place])   . Hypercholesterolemia   . Hypertension    Past Surgical History:  Procedure Laterality Date  . ABDOMINAL HYSTERECTOMY  1971   excessive bleeding  . APPENDECTOMY  age 56  . BREAST LUMPECTOMY  1958   benign  . DILATION AND CURETTAGE OF UTERUS  1970   Family History  Problem Relation Age of Onset  . Liver cancer Father   . Stroke Mother   . Breast cancer Neg Hx   . Colon cancer Neg Hx    Social History   Social History  . Marital status: Widowed    Spouse name: N/A  . Number of children: 3  . Years of education: N/A   Social History Main Topics  . Smoking status: Never Smoker  . Smokeless tobacco: Never Used  . Alcohol use No  . Drug use: No  . Sexual activity: Not Asked   Other Topics Concern  . None   Social History Narrative  . None    Outpatient Encounter Prescriptions as of 04/16/2017  Medication Sig  . amLODipine (NORVASC) 5 MG tablet TAKE 1 TABLET BY MOUTH TWO  TIMES DAILY  . clopidogrel (PLAVIX) 75 MG tablet TAKE 1 TABLET BY MOUTH  EVERY DAY  . glucose blood (ONE TOUCH ULTRA TEST)  test strip TEST BG TWICE DAILY  . Lancet Devices (ONE TOUCH DELICA LANCING DEV) MISC Use twice daily Dx: 250.00  . metFORMIN (GLUCOPHAGE-XR) 500 MG 24 hr tablet TAKE 1 TABLET BY MOUTH TWO  TIMES DAILY BEFORE A MEAL.  . Multiple Vitamin (MULTIVITAMIN) tablet Take 1 tablet by mouth daily.  . timolol (BETIMOL) 0.5 % ophthalmic solution 1 drop 2 (two) times daily.  . timolol (TIMOPTIC) 0.5 % ophthalmic solution   . amoxicillin-clavulanate (AUGMENTIN) 500-125 MG tablet Take 1 tablet (500 mg total) by mouth 2 (two) times daily.   No facility-administered encounter medications on file as of 04/16/2017.     Review of Systems  Constitutional: Negative for appetite change and unexpected weight change.  HENT: Positive for congestion.        Runny nose.    Respiratory: Positive for cough. Negative for chest tightness and shortness of breath.   Cardiovascular: Negative for chest pain, palpitations and leg swelling.  Gastrointestinal: Negative for abdominal pain, diarrhea, nausea and vomiting.  Musculoskeletal: Negative for joint swelling and myalgias.  Skin:       Persistent foot wound.    Neurological: Negative for dizziness, light-headedness and  headaches.  Psychiatric/Behavioral: Negative for agitation and dysphoric mood.       Objective:    Physical Exam  Constitutional: She appears well-developed and well-nourished. No distress.  HENT:  Nose: Nose normal.  Mouth/Throat: Oropharynx is clear and moist.  Neck: Neck supple. No thyromegaly present.  Cardiovascular: Normal rate and regular rhythm.   Pulmonary/Chest: Breath sounds normal. No respiratory distress. She has no wheezes.  Abdominal: Soft. Bowel sounds are normal. There is no tenderness.  Musculoskeletal: She exhibits no edema or tenderness.  Lymphadenopathy:    She has no cervical adenopathy.  Skin: No rash noted. No erythema.  Wound - plantar surface of foot.  Some oozing with odor.  No surrounding erythema.    Psychiatric: She  has a normal mood and affect. Her behavior is normal.    BP (!) 160/80 (BP Location: Left Arm, Patient Position: Sitting, Cuff Size: Normal)   Pulse 87   Temp 99.8 F (37.7 C) (Oral)   Resp 16   Wt 103 lb 12.8 oz (47.1 kg)   LMP 07/21/1966   SpO2 98%   BMI 17.82 kg/m  Wt Readings from Last 3 Encounters:  04/16/17 103 lb 12.8 oz (47.1 kg)  03/02/17 103 lb (46.7 kg)  02/24/17 104 lb 9.6 oz (47.4 kg)     Lab Results  Component Value Date   WBC 7.8 07/07/2016   HGB 14.1 07/07/2016   HCT 41.6 07/07/2016   PLT 157.0 07/07/2016   GLUCOSE 143 (H) 02/22/2017   CHOL 158 02/22/2017   TRIG 142.0 02/22/2017   HDL 48.80 02/22/2017   LDLDIRECT 66.4 05/24/2013   LDLCALC 81 02/22/2017   ALT 10 02/22/2017   AST 17 02/22/2017   NA 141 02/22/2017   K 4.2 02/22/2017   CL 104 02/22/2017   CREATININE 0.68 02/22/2017   BUN 17 02/22/2017   CO2 30 02/22/2017   TSH 3.06 02/22/2017   HGBA1C 6.7 (H) 02/22/2017   MICROALBUR 3.0 (H) 02/24/2017    Dg Foot Complete Left  Result Date: 07/30/2015 CLINICAL DATA:  Nonhealing wound. EXAM: LEFT FOOT - COMPLETE 3+ VIEW COMPARISON:  MRI 10/09/2014. FINDINGS: Diffuse osteopenia and degenerative change. No acute bony abnormality identified. A a wound with a bandage is noted over the plantar aspect of the distal foot. No adjacent acute bony abnormality. If osteomyelitis is of concern MRI can be obtained. IMPRESSION: Soft tissue wound with a bandage is noted over the plantar aspect of the distal left foot. Diffuse osteopenia degenerative change. No acute bony abnormality. Electronically Signed   By: Marcello Moores  Register   On: 07/30/2015 15:46       Assessment & Plan:   Problem List Items Addressed This Visit    Foot ulcer ([place])    Persistent foot ulcer.  Was evaluated previously at local wound clinic.  Son request referral to [facility] wound clinic.  Some drainage.  Odor.  Cover with augmention.  Refer to wound clinic as outlined.        Hypertension    Blood  pressure elevated.  Has been under reasonable control.  Same medication regimen.  Follow pressures.  Follow metabolic panel.        Type 2 diabetes mellitus ([place])    She watches her diet.  Stays active.  Last a1c 6.7.        URI (upper respiratory infection)    Symptoms as outlined. Treat with steroid nasal spray and robitussin Dm as directed.  Will check cxr.  Being placed on  abx for her foot wound.  Should cover if any bacterial infection.  Follow.         Other Visit Diagnoses    Cough    -  Primary   Relevant Orders   DG Chest 2 View (Completed)       Einar Pheasant, MD

## 2017-04-18 ENCOUNTER — Encounter: Payer: Self-pay | Admitting: Internal Medicine

## 2017-04-18 DIAGNOSIS — J069 Acute upper respiratory infection, unspecified: Secondary | ICD-10-CM | POA: Insufficient documentation

## 2017-04-18 DIAGNOSIS — L97512 Non-pressure chronic ulcer of other part of right foot with fat layer exposed: Secondary | ICD-10-CM | POA: Insufficient documentation

## 2017-04-18 DIAGNOSIS — L97509 Non-pressure chronic ulcer of other part of unspecified foot with unspecified severity: Secondary | ICD-10-CM | POA: Insufficient documentation

## 2017-04-18 NOTE — Assessment & Plan Note (Signed)
She watches her diet.  Stays active.  Last a1c 6.7.

## 2017-04-18 NOTE — Assessment & Plan Note (Signed)
Blood pressure elevated.  Has been under reasonable control.  Same medication regimen.  Follow pressures.  Follow metabolic panel.

## 2017-04-18 NOTE — Assessment & Plan Note (Signed)
Symptoms as outlined. Treat with steroid nasal spray and robitussin Dm as directed.  Will check cxr.  Being placed on abx for her foot wound.  Should cover if any bacterial infection.  Follow.

## 2017-04-18 NOTE — Assessment & Plan Note (Signed)
Persistent foot ulcer.  Was evaluated previously at local wound clinic.  Son request referral to [facility] wound clinic.  Some drainage.  Odor.  Cover with augmention.  Refer to wound clinic as outlined.

## 2017-04-30 ENCOUNTER — Encounter: Payer: Self-pay | Admitting: Internal Medicine

## 2017-06-03 DIAGNOSIS — Z91041 Radiographic dye allergy status: Secondary | ICD-10-CM | POA: Diagnosis not present

## 2017-06-03 DIAGNOSIS — Z4800 Encounter for change or removal of nonsurgical wound dressing: Secondary | ICD-10-CM | POA: Diagnosis not present

## 2017-06-03 DIAGNOSIS — L97512 Non-pressure chronic ulcer of other part of right foot with fat layer exposed: Secondary | ICD-10-CM | POA: Diagnosis not present

## 2017-06-03 DIAGNOSIS — E11621 Type 2 diabetes mellitus with foot ulcer: Secondary | ICD-10-CM | POA: Diagnosis not present

## 2017-06-03 DIAGNOSIS — S91309D Unspecified open wound, unspecified foot, subsequent encounter: Secondary | ICD-10-CM | POA: Diagnosis not present

## 2017-06-03 DIAGNOSIS — Z888 Allergy status to other drugs, medicaments and biological substances status: Secondary | ICD-10-CM | POA: Diagnosis not present

## 2017-06-03 DIAGNOSIS — S91302D Unspecified open wound, left foot, subsequent encounter: Secondary | ICD-10-CM | POA: Diagnosis not present

## 2017-06-03 DIAGNOSIS — Z7984 Long term (current) use of oral hypoglycemic drugs: Secondary | ICD-10-CM | POA: Diagnosis not present

## 2017-06-03 DIAGNOSIS — L97529 Non-pressure chronic ulcer of other part of left foot with unspecified severity: Secondary | ICD-10-CM | POA: Diagnosis not present

## 2017-06-04 DIAGNOSIS — L97509 Non-pressure chronic ulcer of other part of unspecified foot with unspecified severity: Secondary | ICD-10-CM | POA: Diagnosis not present

## 2017-06-14 DIAGNOSIS — H401132 Primary open-angle glaucoma, bilateral, moderate stage: Secondary | ICD-10-CM | POA: Diagnosis not present

## 2017-06-25 DIAGNOSIS — E1140 Type 2 diabetes mellitus with diabetic neuropathy, unspecified: Secondary | ICD-10-CM | POA: Diagnosis not present

## 2017-06-25 DIAGNOSIS — L84 Corns and callosities: Secondary | ICD-10-CM | POA: Diagnosis not present

## 2017-06-25 DIAGNOSIS — E11621 Type 2 diabetes mellitus with foot ulcer: Secondary | ICD-10-CM | POA: Diagnosis not present

## 2017-06-25 DIAGNOSIS — L97529 Non-pressure chronic ulcer of other part of left foot with unspecified severity: Secondary | ICD-10-CM | POA: Diagnosis not present

## 2017-06-25 DIAGNOSIS — L97512 Non-pressure chronic ulcer of other part of right foot with fat layer exposed: Secondary | ICD-10-CM | POA: Diagnosis not present

## 2017-06-26 DIAGNOSIS — R936 Abnormal findings on diagnostic imaging of limbs: Secondary | ICD-10-CM | POA: Diagnosis not present

## 2017-06-26 DIAGNOSIS — L97512 Non-pressure chronic ulcer of other part of right foot with fat layer exposed: Secondary | ICD-10-CM | POA: Diagnosis not present

## 2017-06-26 DIAGNOSIS — M7662 Achilles tendinitis, left leg: Secondary | ICD-10-CM | POA: Diagnosis not present

## 2017-06-26 DIAGNOSIS — M7752 Other enthesopathy of left foot: Secondary | ICD-10-CM | POA: Diagnosis not present

## 2017-06-26 DIAGNOSIS — M869 Osteomyelitis, unspecified: Secondary | ICD-10-CM | POA: Diagnosis not present

## 2017-06-26 DIAGNOSIS — R600 Localized edema: Secondary | ICD-10-CM | POA: Diagnosis not present

## 2017-06-26 DIAGNOSIS — E11621 Type 2 diabetes mellitus with foot ulcer: Secondary | ICD-10-CM | POA: Diagnosis not present

## 2017-06-26 DIAGNOSIS — S91302D Unspecified open wound, left foot, subsequent encounter: Secondary | ICD-10-CM | POA: Diagnosis not present

## 2017-07-05 ENCOUNTER — Other Ambulatory Visit (INDEPENDENT_AMBULATORY_CARE_PROVIDER_SITE_OTHER): Payer: Medicare Other

## 2017-07-05 ENCOUNTER — Telehealth: Payer: Self-pay | Admitting: Internal Medicine

## 2017-07-05 ENCOUNTER — Ambulatory Visit (INDEPENDENT_AMBULATORY_CARE_PROVIDER_SITE_OTHER): Payer: Medicare Other

## 2017-07-05 VITALS — BP 120/68 | HR 80 | Temp 98.1°F | Resp 14 | Ht 64.0 in | Wt 105.8 lb

## 2017-07-05 DIAGNOSIS — Z0000 Encounter for general adult medical examination without abnormal findings: Secondary | ICD-10-CM

## 2017-07-05 DIAGNOSIS — E1140 Type 2 diabetes mellitus with diabetic neuropathy, unspecified: Secondary | ICD-10-CM | POA: Diagnosis not present

## 2017-07-05 DIAGNOSIS — I10 Essential (primary) hypertension: Secondary | ICD-10-CM

## 2017-07-05 DIAGNOSIS — E7800 Pure hypercholesterolemia, unspecified: Secondary | ICD-10-CM | POA: Diagnosis not present

## 2017-07-05 DIAGNOSIS — Z1331 Encounter for screening for depression: Secondary | ICD-10-CM

## 2017-07-05 LAB — LIPID PANEL
Cholesterol: 157 mg/dL (ref 0–200)
HDL: 54.3 mg/dL (ref >39.00)
LDL Cholesterol: 79 mg/dL (ref 0–99)
NonHDL: 102.78
Total CHOL/HDL Ratio: 3
Triglycerides: 120 mg/dL (ref 0.0–149.0)
VLDL: 24 mg/dL (ref 0.0–40.0)

## 2017-07-05 LAB — HEMOGLOBIN A1C
Hgb A1c MFr Bld: 6.5 % (ref 4.6–6.5)
Hgb A1c MFr Bld: 6.5 % (ref 4.6–6.5)

## 2017-07-05 LAB — BASIC METABOLIC PANEL
BUN: 13 mg/dL (ref 6–23)
CO2: 30 mEq/L (ref 19–32)
Calcium: 9.5 mg/dL (ref 8.4–10.5)
Chloride: 104 mEq/L (ref 96–112)
Creatinine, Ser: 0.57 mg/dL (ref 0.40–1.20)
GFR: 104.9 mL/min (ref >60.00)
Glucose, Bld: 129 mg/dL — ABNORMAL HIGH (ref 70–99)
Potassium: 4.3 mEq/L (ref 3.5–5.1)
Sodium: 140 mEq/L (ref 135–145)

## 2017-07-05 LAB — HEPATIC FUNCTION PANEL
ALT: 11 U/L (ref 0–35)
AST: 18 U/L (ref 0–37)
Albumin: 4.4 g/dL (ref 3.5–5.2)
Alkaline Phosphatase: 58 U/L (ref 39–117)
Bilirubin, Direct: 0.1 mg/dL (ref 0.0–0.3)
Total Bilirubin: 1 mg/dL (ref 0.2–1.2)
Total Protein: 7.8 g/dL (ref 6.0–8.3)

## 2017-07-05 NOTE — Telephone Encounter (Signed)
-----   Message from Dia Crawford, LPN sent at [id number]  6:28 PM EST ----- Regarding: RE: FYI No ma'am.  She does not need anything prior to Wednesday.   I did ask to confirm and she declined.  She was with her daughter "Dot" who plans to return with her on Wednesday and voiced no other concerns to be addressed today as well.  Thanks, Denisa ----- Message ----- From: Einar Pheasant, MD Sent: 07/05/2017   4:27 PM To: Denisa L O'Brien-Blaney, LPN Subject: RE: Juluis Rainier                                        Just wanted to confirm, so you do not think she needs anything prior to Wednesday.  Thanks    Dr Nicki Reaper ----- Message ----- From: Dia Crawford, LPN Sent: [id number]/54/6568   2:00 PM To: Einar Pheasant, MD Subject: Fritzi Mandes Dr Nicki Reaper, Pt seen today for AWV and wanted you to know she saw blood in her stool twice on one day about a month ago.  She is scheduled to see you this Wednesday 07/07/17.  No ongoing symptoms nor has she seen any blood since then. No pain, no change in appetite, no cramping.   She just wanted you to know.  See [facility] care everywhere for update on her foot wound.  Thanks, Denisa

## 2017-07-05 NOTE — Progress Notes (Signed)
Subjective:   Angela Patterson is a 83 y.o. female who presents for Medicare Annual (Subsequent) preventive examination.  Review of Systems:  No ROS.  Medicare Wellness Visit. Additional risk factors are reflected in the social history. Cardiac Risk Factors include: advanced age (>64men, >25 women);hypertension;diabetes mellitus     Objective:     Vitals: BP 120/68 (BP Location: Left Arm, Patient Position: Sitting, Cuff Size: Normal)   Pulse 80   Temp 98.1 F (36.7 C) (Oral)   Resp 14   Ht 5\' 4"  (1.626 m)   Wt 105 lb 12.8 oz (48 kg)   LMP 07/21/1966   SpO2 99%   BMI 18.16 kg/m   Body mass index is 18.16 kg/m.  Advanced Directives 07/05/2017 11/12/2015  Does Patient Have a Medical Advance Directive? Yes No  Type of Paramedic of [place];Living will -  Does patient want to make changes to medical advance directive? No - Patient declined -  Copy of [place] in Chart? No - copy requested -    Tobacco Social History   Tobacco Use  Smoking Status Never Smoker  Smokeless Tobacco Never Used     Counseling given: Not Answered   Clinical Intake:  Pre-visit preparation completed: Yes  Pain : No/denies pain     Nutritional Status: BMI <19  Underweight Diabetes: Yes  How often do you need to have someone help you when you read instructions, pamphlets, or other written materials from your doctor or pharmacy?: 2 - Rarely  Interpreter Needed?: No     Past Medical History:  Diagnosis Date  . Allergy   . Chicken pox   . CVA (cerebral vascular accident) ([place])   . Diabetes mellitus ([place])   . Hypercholesterolemia   . Hypertension    Past Surgical History:  Procedure Laterality Date  . ABDOMINAL HYSTERECTOMY  1971   excessive bleeding  . APPENDECTOMY  age 54  . BREAST LUMPECTOMY  1958   benign  . DILATION AND CURETTAGE OF UTERUS  1970   Family History  Problem Relation Age of Onset  . Liver cancer Father   . Stroke  Mother   . Diabetes Brother   . Hypertension Daughter   . Hypertension Son   . Hypertension Daughter   . Cancer Grandchild        breast  . Diabetes Grandchild   . Breast cancer Neg Hx   . Colon cancer Neg Hx    Social History   Socioeconomic History  . Marital status: Widowed    Spouse name: None  . Number of children: 3  . Years of education: None  . Highest education level: None  Social Needs  . Financial resource strain: None  . Food insecurity - worry: None  . Food insecurity - inability: None  . Transportation needs - medical: None  . Transportation needs - non-medical: None  Occupational History  . None  Tobacco Use  . Smoking status: Never Smoker  . Smokeless tobacco: Never Used  Substance and Sexual Activity  . Alcohol use: No    Alcohol/week: 0.0 oz  . Drug use: No  . Sexual activity: None  Other Topics Concern  . None  Social History Narrative  . None    Outpatient Encounter Medications as of 07/05/2017  Medication Sig  . amLODipine (NORVASC) 5 MG tablet TAKE 1 TABLET BY MOUTH TWO  TIMES DAILY  . clopidogrel (PLAVIX) 75 MG tablet TAKE 1 TABLET BY MOUTH  EVERY DAY  . glucose blood (ONE TOUCH ULTRA TEST) test strip TEST BG TWICE DAILY  . Lancet Devices (ONE TOUCH DELICA LANCING DEV) MISC Use twice daily Dx: 250.00  . metFORMIN (GLUCOPHAGE-XR) 500 MG 24 hr tablet TAKE 1 TABLET BY MOUTH TWO  TIMES DAILY BEFORE A MEAL.  . Multiple Vitamin (MULTIVITAMIN) tablet Take 1 tablet by mouth daily.  . Probiotic Product (PROBIOTIC DAILY PO) Take 1 tablet by mouth daily.  . timolol (BETIMOL) 0.5 % ophthalmic solution 1 drop 2 (two) times daily.  . timolol (TIMOPTIC) 0.5 % ophthalmic solution   . [DISCONTINUED] amoxicillin-clavulanate (AUGMENTIN) 500-125 MG tablet Take 1 tablet (500 mg total) by mouth 2 (two) times daily.   No facility-administered encounter medications on file as of 07/05/2017.     Activities of Daily Living In your present state of health, do you  have any difficulty performing the following activities: 07/05/2017  Hearing? Y  Vision? N  Walking or climbing stairs? N  Dressing or bathing? N  Doing errands, shopping? N  Preparing Food and eating ? N  Using the Toilet? N  In the past six months, have you accidently leaked urine? N  Do you have problems with loss of bowel control? N  Managing your Medications? N  Managing your Finances? N  Housekeeping or managing your Housekeeping? N  Comment Daughters assist with heavy cleaning  Some recent data might be hidden    Patient Care Team: Einar Pheasant, MD as PCP - General (Internal Medicine)    Assessment:   This is a routine wellness examination for Angela Patterson. The goal of the wellness visit is to assist the patient how to close the gaps in care and create a preventative care plan for the patient.   The roster of all physicians providing medical care to patient is listed in the Snapshot section of the chart.  Taking calcium VIT D as appropriate/Osteoporosis risk reviewed.    Safety issues reviewed; lives alone.  Life alert, smoke and carbon monoxide detectors in the home. No firearms in the home.  Wears seatbelts when driving or riding with others. Patient does wear sunscreen or protective clothing when in direct sunlight. No violence in the home.  Encouraged to use cane when walking for stability.   Depression- PHQ 2 &9 complete.  No signs/symptoms or verbal communication regarding little pleasure in doing things, feeling down, depressed or hopeless. No changes in sleeping, energy, eating, concentrating.  No thoughts of self harm or harm towards others.  Time spent on this topic is 10 minutes.   Patient is alert, normal appearance, oriented to person/place/and time. Correctly identified the month, year and performing simple calculations. Displays appropriate judgement and can read correct time from watch face.   No new identified risk were noted.  No failures at ADL's or IADL's.      BMI- discussed the importance of a healthy diet, water intake and the benefits of aerobic exercise. Educational material provided. States she walks in the home and does chair exercises to build and maintain her strength.   24 hour diet recall: Breakfast: biscuit, eggs, toast, fruit Lunch: 2 vegetables, sandwich Dinner: [place], jello, fruit  Daily fluid intake: 1 cups of caffeine, 6 cups of water  Eye- Visual acuity not assessed per patient preference since they have regular follow up with the ophthalmologist. Cataracts extracted, bilateral.  Wears corrective lenses when reading.    Sleep patterns- Sleeps 5-7 hours at night.   Fasting labs completed today.  TDAP  vaccine deferred per patient preference.  Follow up with insurance.  Educational material provided.  Mammogram discussed; removed from HM per patients request, aged out.  Dexa Scan discussed; postponed per patient request.  Educational material provided.  Podiatry; followed by Buddy Duty, DPM.  Last OV 06/26/2017; open foot wound.  Monitored and bandaged daily at home.  Upcoming appointment January 2019.  See [facility] everywhere.  Exercise Activities and Dietary recommendations    Goals    . Healthy Lifestyle     Stay hydrated Healthy diet Stay active around the home       Fall Risk Fall Risk  07/05/2017 03/02/2017 10/19/2016 07/11/2015 12/27/2013  Falls in the past year? Yes No No No No  Number falls in past yr: 1 - - - -  Injury with Fall? No - - - -   IDepression Screen PHQ 2/9 Scores 07/05/2017 03/02/2017 10/19/2016 07/11/2015  PHQ - 2 Score 0 0 0 0  PHQ- 9 Score 0 - - -     Cognitive Function     6CIT Screen 07/05/2017  What Year? 0 points  What month? 0 points  What time? 0 points  Count back from 20 0 points  Months in reverse 0 points  Repeat phrase 0 points  Total Score 0    Immunization History  Administered Date(s) Administered  . DTaP 03/21/2012  . Influenza Split 03/21/2012,  04/03/2014  . Influenza-Unspecified 04/09/2015, 04/14/2016, 04/28/2017  . Pneumococcal Conjugate-13 07/09/2014  . Pneumococcal Polysaccharide-23 07/09/2012    Screening Tests Health Maintenance  Topic Date Due  . TETANUS/TDAP  03/06/1942  . DEXA SCAN  07/05/2018 (Originally 03/06/1988)  . HEMOGLOBIN A1C  08/25/2017  . OPHTHALMOLOGY EXAM  12/15/2017  . URINE MICROALBUMIN  02/24/2018  . FOOT EXAM  06/26/2018  . INFLUENZA VACCINE  Completed  . PNA vac Low Risk Adult  Completed  . MAMMOGRAM  Discontinued    C   Plan:    End of life planning; Advance aging; Advanced directives discussed. Copy of current HCPOA/Living Will requested.    I have personally reviewed and noted the following in the patient's chart:   . Medical and social history . Use of alcohol, tobacco or illicit drugs  . Current medications and supplements . Functional ability and status . Nutritional status . Physical activity . Advanced directives . List of other physicians . Hospitalizations, surgeries, and ER visits in previous 12 months . Vitals . Screenings to include cognitive, depression, and falls . Referrals and appointments  In addition, I have reviewed and discussed with patient certain preventive protocols, quality metrics, and best practice recommendations. A written personalized care plan for preventive services as well as general preventive health recommendations were provided to patient.     Varney Biles, LPN  [id number]/[id number]   Reviewed above information.  Note also sent regarding previous notice of some blood in her stool.  None now.  Has appt with me tomorrow.  No acute issues identified - see phone note.  Plan for f/u 07/07/17.    Dr Nicki Reaper

## 2017-07-05 NOTE — Patient Instructions (Addendum)
  [name] , Thank you for taking time to come for your Medicare Wellness Visit. I appreciate your ongoing commitment to your health goals. Please review the following plan we discussed and let me know if I can assist you in the future.   These are the goals we discussed: Goals    . Healthy Lifestyle     Stay hydrated Healthy diet Stay active around the home       This is a list of the screening recommended for you and due dates:  Health Maintenance  Topic Date Due  . Tetanus Vaccine  03/06/1942  . DEXA scan (bone density measurement)  07/05/2018*  . Hemoglobin A1C  08/25/2017  . Eye exam for diabetics  12/15/2017  . Urine Protein Check  02/24/2018  . Complete foot exam   06/26/2018  . Flu Shot  Completed  . Pneumonia vaccines  Completed  . Mammogram  Discontinued  *Topic was postponed. The date shown is not the original due date.

## 2017-07-07 ENCOUNTER — Encounter: Payer: Self-pay | Admitting: Internal Medicine

## 2017-07-07 ENCOUNTER — Ambulatory Visit: Payer: Medicare Other | Admitting: Internal Medicine

## 2017-07-07 VITALS — BP 174/80 | HR 84 | Temp 98.6°F | Ht 64.0 in | Wt 106.0 lb

## 2017-07-07 DIAGNOSIS — E11621 Type 2 diabetes mellitus with foot ulcer: Secondary | ICD-10-CM

## 2017-07-07 DIAGNOSIS — L97509 Non-pressure chronic ulcer of other part of unspecified foot with unspecified severity: Secondary | ICD-10-CM | POA: Diagnosis not present

## 2017-07-07 DIAGNOSIS — D696 Thrombocytopenia, unspecified: Secondary | ICD-10-CM | POA: Diagnosis not present

## 2017-07-07 DIAGNOSIS — Z8673 Personal history of transient ischemic attack (TIA), and cerebral infarction without residual deficits: Secondary | ICD-10-CM | POA: Diagnosis not present

## 2017-07-07 DIAGNOSIS — I10 Essential (primary) hypertension: Secondary | ICD-10-CM

## 2017-07-07 DIAGNOSIS — L97529 Non-pressure chronic ulcer of other part of left foot with unspecified severity: Secondary | ICD-10-CM | POA: Diagnosis not present

## 2017-07-07 DIAGNOSIS — K625 Hemorrhage of anus and rectum: Secondary | ICD-10-CM

## 2017-07-07 DIAGNOSIS — E7800 Pure hypercholesterolemia, unspecified: Secondary | ICD-10-CM

## 2017-07-07 LAB — CBC WITH DIFFERENTIAL/PLATELET
Basophils Absolute: 0.1 10*3/uL (ref 0.0–0.1)
Basophils Relative: 1.3 % (ref 0.0–3.0)
Eosinophils Absolute: 0.1 10*3/uL (ref 0.0–0.7)
Eosinophils Relative: 1.6 % (ref 0.0–5.0)
HCT: 44.8 % (ref 36.0–46.0)
Hemoglobin: 14.9 g/dL (ref 12.0–15.0)
Lymphocytes Relative: 26 % (ref 12.0–46.0)
Lymphs Abs: 2 10*3/uL (ref 0.7–4.0)
MCHC: 33.2 g/dL (ref 30.0–36.0)
MCV: 96.6 fl (ref 78.0–100.0)
Monocytes Absolute: 0.6 10*3/uL (ref 0.1–1.0)
Monocytes Relative: 8.1 % (ref 3.0–12.0)
Neutro Abs: 5 10*3/uL (ref 1.4–7.7)
Neutrophils Relative %: 63 % (ref 43.0–77.0)
Platelets: 141 10*3/uL — ABNORMAL LOW (ref 150.0–400.0)
RBC: 4.64 Mil/uL (ref 3.87–5.11)
RDW: 13.3 % (ref 11.5–15.5)
WBC: 7.9 10*3/uL (ref 4.0–10.5)

## 2017-07-07 LAB — FERRITIN: Ferritin: 28.2 ng/mL (ref 10.0–291.0)

## 2017-07-07 NOTE — Progress Notes (Signed)
Patient ID: Angela Patterson, female   DOB: Aug 22, 1920, 83 y.o.   MRN: [id number]   Subjective:    Patient ID: Angela Patterson, female    DOB: [birth year], 83 y.o.   MRN: [id number]  HPI  Patient here for a scheduled follow up.  She is accompanied by her daughter.  History obtained from both of them.  Reports she has been doing relatively well.  Discussed labs.  Sugars doing well.  No chest pain.  Stays active.  Breathing stable.  Was having some loose stool.  Started taking a otc supplement (root)  - Berberine.  Took for approximately one week.  Noticed her stool became more firm.  Noticed some discomfort in her rectum with one bowel movement and noticed blood.  Had another bowel movement the following day and noticed blood.  No further bleeding.  Only associated with these two bowel movements.  Stopped taking the berberine.  Has not noticed any blood since.  No pain with bm.  No diarrhea or loose stool.  States bowels moving normally now.  No abdominal pain or bloating.  No urine change.  No hematuria or vaginal bleeding.     Past Medical History:  Diagnosis Date  . Allergy   . Chicken pox   . CVA (cerebral vascular accident) ([place])   . Diabetes mellitus ([place])   . Hypercholesterolemia   . Hypertension    Past Surgical History:  Procedure Laterality Date  . ABDOMINAL HYSTERECTOMY  1971   excessive bleeding  . APPENDECTOMY  age 23  . BREAST LUMPECTOMY  1958   benign  . DILATION AND CURETTAGE OF UTERUS  1970   Family History  Problem Relation Age of Onset  . Liver cancer Father   . Stroke Mother   . Diabetes Brother   . Hypertension Daughter   . Hypertension Son   . Hypertension Daughter   . Cancer Grandchild        breast  . Diabetes Grandchild   . Breast cancer Neg Hx   . Colon cancer Neg Hx    Social History   Socioeconomic History  . Marital status: Widowed    Spouse name: None  . Number of children: 3  . Years of education: None  . Highest education level: None  Social  Needs  . Financial resource strain: None  . Food insecurity - worry: None  . Food insecurity - inability: None  . Transportation needs - medical: None  . Transportation needs - non-medical: None  Occupational History  . None  Tobacco Use  . Smoking status: Never Smoker  . Smokeless tobacco: Never Used  Substance and Sexual Activity  . Alcohol use: No    Alcohol/week: 0.0 oz  . Drug use: No  . Sexual activity: None  Other Topics Concern  . None  Social History Narrative  . None    Outpatient Encounter Medications as of 07/07/2017  Medication Sig  . amLODipine (NORVASC) 5 MG tablet TAKE 1 TABLET BY MOUTH TWO  TIMES DAILY  . clopidogrel (PLAVIX) 75 MG tablet TAKE 1 TABLET BY MOUTH  EVERY DAY  . glucose blood (ONE TOUCH ULTRA TEST) test strip TEST BG TWICE DAILY  . Lancet Devices (ONE TOUCH DELICA LANCING DEV) MISC Use twice daily Dx: 250.00  . metFORMIN (GLUCOPHAGE-XR) 500 MG 24 hr tablet TAKE 1 TABLET BY MOUTH TWO  TIMES DAILY BEFORE A MEAL.  . Multiple Vitamin (MULTIVITAMIN) tablet Take 1 tablet by mouth daily.  . Probiotic Product (  PROBIOTIC DAILY PO) Take 1 tablet by mouth daily.  . timolol (BETIMOL) 0.5 % ophthalmic solution 1 drop 2 (two) times daily.  . timolol (TIMOPTIC) 0.5 % ophthalmic solution    No facility-administered encounter medications on file as of 07/07/2017.     Review of Systems  Constitutional: Negative for appetite change and unexpected weight change.  HENT: Negative for congestion and sinus pressure.   Respiratory: Negative for cough, chest tightness and shortness of breath.   Cardiovascular: Negative for chest pain, palpitations and leg swelling.  Gastrointestinal: Negative for abdominal pain, nausea and vomiting.       Rectal bleeding.    Genitourinary: Negative for difficulty urinating, dysuria and hematuria.  Musculoskeletal: Negative for back pain and joint swelling.  Skin: Negative for color change and rash.       Foot lesion - improved.      Neurological: Negative for dizziness, light-headedness and headaches.  Psychiatric/Behavioral: Negative for agitation and dysphoric mood.       Objective:     Blood pressure rechecked by me:  156/78  Physical Exam  Constitutional: She appears well-developed and well-nourished. No distress.  HENT:  Nose: Nose normal.  Mouth/Throat: Oropharynx is clear and moist.  Neck: Neck supple. No thyromegaly present.  Cardiovascular: Normal rate and regular rhythm.  Pulmonary/Chest: Breath sounds normal. No respiratory distress. She has no wheezes.  Abdominal: Soft. Bowel sounds are normal. There is no tenderness.  Genitourinary:  Genitourinary Comments: Rectal exam- heme negative.  No hemorrhoids visualized.    Musculoskeletal: She exhibits no edema or tenderness.  Lymphadenopathy:    She has no cervical adenopathy.  Skin: No rash noted. No erythema.  Foot lesion- improved.   Psychiatric: She has a normal mood and affect. Her behavior is normal.    BP (!) 174/80   Pulse 84   Temp 98.6 F (37 C) (Oral)   Ht 5' 4" (1.626 m)   Wt 106 lb (48.1 kg)   LMP 07/21/1966   SpO2 95%   BMI 18.19 kg/m  Wt Readings from Last 3 Encounters:  07/07/17 106 lb (48.1 kg)  07/05/17 105 lb 12.8 oz (48 kg)  04/16/17 103 lb 12.8 oz (47.1 kg)     Lab Results  Component Value Date   WBC 7.9 07/07/2017   HGB 14.9 07/07/2017   HCT 44.8 07/07/2017   PLT 141.0 (L) 07/07/2017   GLUCOSE 129 (H) 07/05/2017   CHOL 157 07/05/2017   TRIG 120.0 07/05/2017   HDL 54.30 07/05/2017   LDLDIRECT 66.4 05/24/2013   LDLCALC 79 07/05/2017   ALT 11 07/05/2017   AST 18 07/05/2017   NA 140 07/05/2017   K 4.3 07/05/2017   CL 104 07/05/2017   CREATININE 0.57 07/05/2017   BUN 13 07/05/2017   CO2 30 07/05/2017   TSH 3.06 02/22/2017   HGBA1C 6.5 07/05/2017   HGBA1C 6.5 07/05/2017   MICROALBUR 3.0 (H) 02/24/2017       Assessment & Plan:   Problem List Items Addressed This Visit    Foot ulcer ([place])    Much  improved.  Being followed by wound clinic at [facility].        History of CVA (cerebrovascular accident)    On plavix and doing well.  Follow.       Hypercholesterolemia    Follow lipid panel.   Lab Results  Component Value Date   CHOL 157 07/05/2017   HDL 54.30 07/05/2017   LDLCALC 79 07/05/2017   LDLDIRECT 66.4  05/24/2013   TRIG 120.0 07/05/2017   CHOLHDL 3 07/05/2017        Hypertension    Elevated today.  Has been under good control.  Recheck improved.  She is a little anxious today about her previous rectal bleeding.  Same medication regimen.  Hold on making changes.  Follow.        Pure hypercholesterolemia - Primary   Rectal bleeding    Noticed the two episodes as outlined.  Heme negative on exam.  Noticed when stool changed consistency and noticed a little burning discomfort with those bowel movements.  No burning discomfort since.  No further bleeding. Bowels moving normally.  Off the root supplement.  Remain off.  Check cbc.  IFOB.  Wants to hold on any further testing.        Relevant Orders   CBC with Differential/Platelet (Completed)   Ferritin (Completed)   Fecal occult blood, imunochemical   Thrombocytopenia (HCC)    Follow platelet count.  Has been stable.        Type 2 diabetes mellitus (HCC)    Discussed sugars and recent labs.  Overall sugars under good control.  Follow met b and a1c.            Einar Pheasant, MD

## 2017-07-07 NOTE — Progress Notes (Signed)
Pre visit review using our clinic review tool, if applicable. No additional management support is needed unless otherwise documented below in the visit note. 

## 2017-07-10 ENCOUNTER — Encounter: Payer: Self-pay | Admitting: Internal Medicine

## 2017-07-10 NOTE — Assessment & Plan Note (Signed)
Noticed the two episodes as outlined.  Heme negative on exam.  Noticed when stool changed consistency and noticed a little burning discomfort with those bowel movements.  No burning discomfort since.  No further bleeding. Bowels moving normally.  Off the root supplement.  Remain off.  Check cbc.  IFOB.  Wants to hold on any further testing.

## 2017-07-10 NOTE — Assessment & Plan Note (Signed)
Follow lipid panel.   Lab Results  Component Value Date   CHOL 157 07/05/2017   HDL 54.30 07/05/2017   LDLCALC 79 07/05/2017   LDLDIRECT 66.4 05/24/2013   TRIG 120.0 07/05/2017   CHOLHDL 3 07/05/2017

## 2017-07-10 NOTE — Assessment & Plan Note (Signed)
Much improved.  Being followed by wound clinic at [facility].

## 2017-07-10 NOTE — Assessment & Plan Note (Signed)
Follow platelet count.  Has been stable.

## 2017-07-10 NOTE — Assessment & Plan Note (Signed)
On plavix and doing well.  Follow.   

## 2017-07-10 NOTE — Assessment & Plan Note (Signed)
Elevated today.  Has been under good control.  Recheck improved.  She is a little anxious today about her previous rectal bleeding.  Same medication regimen.  Hold on making changes.  Follow.

## 2017-07-10 NOTE — Assessment & Plan Note (Signed)
Discussed sugars and recent labs.  Overall sugars under good control.  Follow met b and a1c.

## 2017-07-15 ENCOUNTER — Other Ambulatory Visit (INDEPENDENT_AMBULATORY_CARE_PROVIDER_SITE_OTHER): Payer: Medicare Other

## 2017-07-15 DIAGNOSIS — K625 Hemorrhage of anus and rectum: Secondary | ICD-10-CM | POA: Diagnosis not present

## 2017-07-15 LAB — FECAL OCCULT BLOOD, IMMUNOCHEMICAL: Fecal Occult Bld: NEGATIVE

## 2017-07-16 ENCOUNTER — Encounter: Payer: Self-pay | Admitting: Internal Medicine

## 2017-07-26 DIAGNOSIS — M79674 Pain in right toe(s): Secondary | ICD-10-CM | POA: Diagnosis not present

## 2017-07-26 DIAGNOSIS — E11621 Type 2 diabetes mellitus with foot ulcer: Secondary | ICD-10-CM | POA: Diagnosis not present

## 2017-07-26 DIAGNOSIS — E11628 Type 2 diabetes mellitus with other skin complications: Secondary | ICD-10-CM | POA: Diagnosis not present

## 2017-07-26 DIAGNOSIS — L97512 Non-pressure chronic ulcer of other part of right foot with fat layer exposed: Secondary | ICD-10-CM | POA: Diagnosis not present

## 2017-07-26 DIAGNOSIS — B351 Tinea unguium: Secondary | ICD-10-CM | POA: Diagnosis not present

## 2017-07-26 DIAGNOSIS — L84 Corns and callosities: Secondary | ICD-10-CM | POA: Diagnosis not present

## 2017-07-27 ENCOUNTER — Ambulatory Visit: Payer: Medicare Other | Admitting: Podiatry

## 2017-09-03 ENCOUNTER — Encounter: Payer: Self-pay | Admitting: Internal Medicine

## 2017-09-03 ENCOUNTER — Ambulatory Visit: Payer: Medicare Other | Admitting: Internal Medicine

## 2017-09-03 DIAGNOSIS — E7800 Pure hypercholesterolemia, unspecified: Secondary | ICD-10-CM | POA: Diagnosis not present

## 2017-09-03 DIAGNOSIS — L97529 Non-pressure chronic ulcer of other part of left foot with unspecified severity: Secondary | ICD-10-CM

## 2017-09-03 DIAGNOSIS — E1140 Type 2 diabetes mellitus with diabetic neuropathy, unspecified: Secondary | ICD-10-CM | POA: Diagnosis not present

## 2017-09-03 DIAGNOSIS — K625 Hemorrhage of anus and rectum: Secondary | ICD-10-CM | POA: Diagnosis not present

## 2017-09-03 DIAGNOSIS — D696 Thrombocytopenia, unspecified: Secondary | ICD-10-CM | POA: Diagnosis not present

## 2017-09-03 DIAGNOSIS — I10 Essential (primary) hypertension: Secondary | ICD-10-CM

## 2017-09-03 DIAGNOSIS — Z8673 Personal history of transient ischemic attack (TIA), and cerebral infarction without residual deficits: Secondary | ICD-10-CM | POA: Diagnosis not present

## 2017-09-03 DIAGNOSIS — S91309D Unspecified open wound, unspecified foot, subsequent encounter: Secondary | ICD-10-CM

## 2017-09-03 NOTE — Progress Notes (Signed)
Patient ID: Angela Patterson, female   DOB: [birth year], 84 y.o.   MRN: [id number]   Subjective:    Patient ID: Angela Patterson, female    DOB: 1921/04/09, 84 y.o.   MRN: [id number]  HPI  Patient here for a scheduled follow up.  She is accompanied by her daughter.  History obtained from both of them.  She reports she is doing relatively well.  Reports increased stress. Discussed with her today.  They both feel this is what is contributing to her intermittent episodes of diarrhea.  States still having loose stool - intermittently.  Relates to times of increased stress.  Has not had any further bleeding like she had previously.  Has noticed occasional blood on tissue when wipes.  No blood in the stool.  No abdominal pain.  Bowels moving.  Foot lesion is getting worse again.  Was being seen at [facility].  Needing to come back locally.  Since has not been going, foot is worsening.  No increased redness or warmth.     Past Medical History:  Diagnosis Date  . Allergy   . Chicken pox   . CVA (cerebral vascular accident) ([place])   . Diabetes mellitus ([place])   . Hypercholesterolemia   . Hypertension    Past Surgical History:  Procedure Laterality Date  . ABDOMINAL HYSTERECTOMY  1971   excessive bleeding  . APPENDECTOMY  age 6  . BREAST LUMPECTOMY  1958   benign  . DILATION AND CURETTAGE OF UTERUS  1970   Family History  Problem Relation Age of Onset  . Liver cancer Father   . Stroke Mother   . Diabetes Brother   . Hypertension Daughter   . Hypertension Son   . Hypertension Daughter   . Cancer Grandchild        breast  . Diabetes Grandchild   . Breast cancer Neg Hx   . Colon cancer Neg Hx    Social History   Socioeconomic History  . Marital status: Widowed    Spouse name: None  . Number of children: 3  . Years of education: None  . Highest education level: None  Social Needs  . Financial resource strain: None  . Food insecurity - worry: None  . Food insecurity - inability: None  .  Transportation needs - medical: None  . Transportation needs - non-medical: None  Occupational History  . None  Tobacco Use  . Smoking status: Never Smoker  . Smokeless tobacco: Never Used  Substance and Sexual Activity  . Alcohol use: No    Alcohol/week: 0.0 oz  . Drug use: No  . Sexual activity: None  Other Topics Concern  . None  Social History Narrative  . None    Outpatient Encounter Medications as of 09/03/2017  Medication Sig  . amLODipine (NORVASC) 5 MG tablet TAKE 1 TABLET BY MOUTH TWO  TIMES DAILY  . clopidogrel (PLAVIX) 75 MG tablet TAKE 1 TABLET BY MOUTH  EVERY DAY  . glucose blood (ONE TOUCH ULTRA TEST) test strip TEST BG TWICE DAILY  . Lancet Devices (ONE TOUCH DELICA LANCING DEV) MISC Use twice daily Dx: 250.00  . metFORMIN (GLUCOPHAGE-XR) 500 MG 24 hr tablet TAKE 1 TABLET BY MOUTH TWO  TIMES DAILY BEFORE A MEAL.  . Multiple Vitamin (MULTIVITAMIN) tablet Take 1 tablet by mouth daily.  . Probiotic Product (PROBIOTIC DAILY PO) Take 1 tablet by mouth daily.  . timolol (BETIMOL) 0.5 % ophthalmic solution 1 drop 2 (two) times daily.  Marland Kitchen  timolol (TIMOPTIC) 0.5 % ophthalmic solution    No facility-administered encounter medications on file as of 09/03/2017.     Review of Systems  Constitutional: Negative for appetite change and unexpected weight change.  HENT: Negative for congestion and sinus pressure.   Respiratory: Negative for cough, chest tightness and shortness of breath.   Cardiovascular: Negative for chest pain, palpitations and leg swelling.  Gastrointestinal: Negative for abdominal pain, nausea and vomiting.       Intermittent loose stool.    Genitourinary: Negative for difficulty urinating and dysuria.  Musculoskeletal: Negative for joint swelling and myalgias.  Skin: Negative for rash.       Persistent foot wound - open.  No surrounding erythema.    Neurological: Negative for dizziness, light-headedness and headaches.  Psychiatric/Behavioral: Negative for  agitation and dysphoric mood.       Objective:    Physical Exam  Constitutional: She appears well-developed and well-nourished. No distress.  HENT:  Nose: Nose normal.  Mouth/Throat: Oropharynx is clear and moist.  Neck: Neck supple. No thyromegaly present.  Cardiovascular: Normal rate and regular rhythm.  Pulmonary/Chest: Breath sounds normal. No respiratory distress. She has no wheezes.  Abdominal: Soft. Bowel sounds are normal. There is no tenderness.  Musculoskeletal: She exhibits no edema or tenderness.  Lymphadenopathy:    She has no cervical adenopathy.  Skin: No rash noted. No erythema.  Open foot lesion - no surrounding erythema.  No increased warmth.    Psychiatric: She has a normal mood and affect. Her behavior is normal.    BP (!) 152/80 (BP Location: Left Arm, Patient Position: Sitting, Cuff Size: Normal)   Pulse 77   Temp 98.1 F (36.7 C) (Oral)   Resp 18   Wt 106 lb 6.4 oz (48.3 kg)   LMP 07/21/1966   SpO2 93%   BMI 18.26 kg/m  Wt Readings from Last 3 Encounters:  09/03/17 106 lb 6.4 oz (48.3 kg)  07/07/17 106 lb (48.1 kg)  07/05/17 105 lb 12.8 oz (48 kg)     Lab Results  Component Value Date   WBC 7.9 07/07/2017   HGB 14.9 07/07/2017   HCT 44.8 07/07/2017   PLT 141.0 (L) 07/07/2017   GLUCOSE 129 (H) 07/05/2017   CHOL 157 07/05/2017   TRIG 120.0 07/05/2017   HDL 54.30 07/05/2017   LDLDIRECT 66.4 05/24/2013   LDLCALC 79 07/05/2017   ALT 11 07/05/2017   AST 18 07/05/2017   NA 140 07/05/2017   K 4.3 07/05/2017   CL 104 07/05/2017   CREATININE 0.57 07/05/2017   BUN 13 07/05/2017   CO2 30 07/05/2017   TSH 3.06 02/22/2017   HGBA1C 6.5 07/05/2017   HGBA1C 6.5 07/05/2017   MICROALBUR 3.0 (H) 02/24/2017    Dg Foot Complete Left  Result Date: 07/30/2015 CLINICAL DATA:  Nonhealing wound. EXAM: LEFT FOOT - COMPLETE 3+ VIEW COMPARISON:  MRI 10/09/2014. FINDINGS: Diffuse osteopenia and degenerative change. No acute bony abnormality identified. A a  wound with a bandage is noted over the plantar aspect of the distal foot. No adjacent acute bony abnormality. If osteomyelitis is of concern MRI can be obtained. IMPRESSION: Soft tissue wound with a bandage is noted over the plantar aspect of the distal left foot. Diffuse osteopenia degenerative change. No acute bony abnormality. Electronically Signed   By: Marcello Moores  Register   On: 07/30/2015 15:46       Assessment & Plan:   Problem List Items Addressed This Visit    Diabetes mellitus (  [place])    Sugars have been doing relatively well.  Reviewed outside sugar readings.  Follow met b and a1c.        Relevant Orders   Hemoglobin A1c   Essential (primary) hypertension    Blood pressure as outlined.  Same medication regimen.  Follow pressures.  Follow metabolic panel.       Relevant Orders   Basic metabolic panel   Foot ulcer ([place])    Had improved.  Was being followed at the wound clinic at [facility]  Needs to come back locally now.  Has appt next week. Keep clean.  Follow.        History of CVA (cerebrovascular accident)    On plavix and doing well.  Follow.        Hypercholesterolemia    Follow lipid panel.       Relevant Orders   Hepatic function panel   Lipid panel   Open wound of foot excluding toes without complication    Has been evaluated by wound clinic and podiatry.  Some worsening recently.  Discussed keeping clean.  Has appt with wound clinic next week.  No surrounding erythema.  Hold on abx.  Follow.        Rectal bleeding    No further episodes like she experienced previously.  Still some loose stools - intermittently.  Some occasional blood on tissue when wipes.  Discussed further evaluation and testing.  Discussed further w/up for the loose stool.  On metformin.  Changed to extended release.  Does not occur daily.  She feels related to stress.  Desires no further intervention.        Relevant Orders   CBC with Differential/Platelet   Thrombocytopenia (HCC)    Platelet  count stable.  Follow.           Einar Pheasant, MD

## 2017-09-05 ENCOUNTER — Encounter: Payer: Self-pay | Admitting: Internal Medicine

## 2017-09-05 NOTE — Assessment & Plan Note (Signed)
Sugars have been doing relatively well.  Reviewed outside sugar readings.  Follow met b and a1c.

## 2017-09-05 NOTE — Assessment & Plan Note (Signed)
Had improved.  Was being followed at the wound clinic at [facility].  Needs to come back locally now.  Has appt next week. Keep clean.  Follow.

## 2017-09-05 NOTE — Assessment & Plan Note (Signed)
No further episodes like she experienced previously.  Still some loose stools - intermittently.  Some occasional blood on tissue when wipes.  Discussed further evaluation and testing.  Discussed further w/up for the loose stool.  On metformin.  Changed to extended release.  Does not occur daily.  She feels related to stress.  Desires no further intervention.

## 2017-09-05 NOTE — Assessment & Plan Note (Signed)
Blood pressure as outlined.  Same medication regimen.  Follow pressures.  Follow metabolic panel.  

## 2017-09-05 NOTE — Assessment & Plan Note (Signed)
On plavix and doing well.  Follow.   

## 2017-09-05 NOTE — Assessment & Plan Note (Signed)
Platelet count stable.  Follow.

## 2017-09-05 NOTE — Assessment & Plan Note (Signed)
Follow lipid panel.   

## 2017-09-05 NOTE — Assessment & Plan Note (Signed)
Has been evaluated by wound clinic and podiatry.  Some worsening recently.  Discussed keeping clean.  Has appt with wound clinic next week.  No surrounding erythema.  Hold on abx.  Follow.

## 2017-09-10 ENCOUNTER — Encounter: Payer: Medicare Other | Attending: Internal Medicine | Admitting: Physician Assistant

## 2017-09-10 DIAGNOSIS — L97522 Non-pressure chronic ulcer of other part of left foot with fat layer exposed: Secondary | ICD-10-CM | POA: Diagnosis not present

## 2017-09-10 DIAGNOSIS — E11621 Type 2 diabetes mellitus with foot ulcer: Secondary | ICD-10-CM | POA: Insufficient documentation

## 2017-09-10 DIAGNOSIS — L84 Corns and callosities: Secondary | ICD-10-CM | POA: Diagnosis not present

## 2017-09-10 DIAGNOSIS — L89894 Pressure ulcer of other site, stage 4: Secondary | ICD-10-CM | POA: Diagnosis not present

## 2017-09-10 DIAGNOSIS — I10 Essential (primary) hypertension: Secondary | ICD-10-CM | POA: Insufficient documentation

## 2017-09-10 DIAGNOSIS — E1140 Type 2 diabetes mellitus with diabetic neuropathy, unspecified: Secondary | ICD-10-CM | POA: Insufficient documentation

## 2017-09-10 DIAGNOSIS — L97509 Non-pressure chronic ulcer of other part of unspecified foot with unspecified severity: Secondary | ICD-10-CM | POA: Diagnosis not present

## 2017-09-12 NOTE — Progress Notes (Signed)
Angela Patterson ([id number]) Visit Report for 09/10/2017 Abuse/Suicide Risk Screen Details Patient Name: Angela Patterson, Angela Patterson. Date of Service: 09/10/2017 12:30 PM Medical Record Number: [id number] Patient Account Number: [id number] Date of Birth/Sex: 03/20/1921 (84 y.o. Female) Treating RN: Roger Shelter Primary Care Ciearra Rufo: Einar Pheasant Other Clinician: Referring Panayiotis [place]: Referral, Self Treating Janese Radabaugh/Extender: STONE III, HOYT Weeks in Treatment: 0 Abuse/Suicide Risk Screen Items Answer ABUSE/SUICIDE RISK SCREEN: Has anyone close to you tried to hurt or harm you recentlyo No Do you feel uncomfortable with anyone in your familyo No Has anyone forced you do things that you didnot want to doo No Do you have any thoughts of harming yourselfo No Patient displays signs or symptoms of abuse and/or neglect. No Electronic Signature(s) Signed: 09/10/2017 4:58:29 PM By: Roger Shelter Entered By: Roger Shelter on 09/10/2017 13:12:58 Angela Patterson ([id number]) -------------------------------------------------------------------------------- Activities of Daily Living Details Patient Name: Angela Patterson. Date of Service: 09/10/2017 12:30 PM Medical Record Number: [id number] Patient Account Number: [id number] Date of Birth/Sex: 1920/07/30 (84 y.o. Female) Treating RN: Roger Shelter Primary Care Fortino Haag: Einar Pheasant Other Clinician: Referring Marsia Cino: Referral, Self Treating Shelley Pooley/Extender: Melburn Hake, HOYT Weeks in Treatment: 0 Activities of Daily Living Items Answer Activities of Daily Living (Please select one for each item) Drive Automobile Completely Able Take Medications Completely Able Use Telephone Completely Able Care for Appearance Completely Able Use Toilet Completely Able Bath / Shower Completely Able Dress Self Completely Able Feed Self Completely Able Walk Completely Able Get In / Out Bed Completely Able Housework Completely Able Prepare Meals  Completely Able Handle Money Completely Able Shop for Self Completely Able Electronic Signature(s) Signed: 09/10/2017 4:58:29 PM By: Roger Shelter Entered By: Roger Shelter on 09/10/2017 13:13:31 Angela Patterson ([id number]) -------------------------------------------------------------------------------- Education Assessment Details Patient Name: Angela Patterson. Date of Service: 09/10/2017 12:30 PM Medical Record Number: [id number] Patient Account Number: [id number] Date of Birth/Sex: 1920-08-17 (84 y.o. Female) Treating RN: Roger Shelter Primary Care Robyne Matar: Einar Pheasant Other Clinician: Referring Rosetta Rupnow: Referral, Self Treating Audrionna Lampton/Extender: Melburn Hake, HOYT Weeks in Treatment: 0 Primary Learner Assessed: Patient Learning Preferences/Education Level/Primary Language Learning Preference: Explanation Highest Education Level: High School Preferred Language: English Cognitive Barrier Assessment/Beliefs Language Barrier: No Translator Needed: No Memory Deficit: No Emotional Barrier: No Cultural/Religious Beliefs Affecting Medical Care: No Physical Barrier Assessment Impaired Vision: Yes Glasses Impaired Hearing: No Decreased Hand dexterity: No Knowledge/Comprehension Assessment Knowledge Level: High Comprehension Level: High Ability to understand written High instructions: Ability to understand verbal High instructions: Motivation Assessment Anxiety Level: Calm Cooperation: Cooperative Education Importance: Acknowledges Need Interest in Health Problems: Asks Questions Perception: Coherent Willingness to Engage in Self- High Management Activities: Readiness to Engage in Self- High Management Activities: Electronic Signature(s) Signed: 09/10/2017 4:58:29 PM By: Roger Shelter Entered By: Roger Shelter on 09/10/2017 13:14:49 Angela Patterson ([id number]) -------------------------------------------------------------------------------- Fall Risk  Assessment Details Patient Name: Angela Patterson. Date of Service: 09/10/2017 12:30 PM Medical Record Number: [id number] Patient Account Number: [id number] Date of Birth/Sex: [birth year] (84 y.o. Female) Treating RN: Roger Shelter Primary Care Evaan Tidwell: Einar Pheasant Other Clinician: Referring Tamryn Popko: Referral, Self Treating Deyanna Mctier/Extender: Melburn Hake, HOYT Weeks in Treatment: 0 Fall Risk Assessment Items Have you had 2 or more falls in the last 12 monthso 0 No Have you had any fall that resulted in injury in the last 12 monthso 0 No FALL RISK ASSESSMENT: History of falling - immediate or within 3 months 0 No Secondary diagnosis 0 No Ambulatory aid None/bed rest/wheelchair/nurse 0 Yes Crutches/cane/walker 0 No Furniture 0  No IV Access/Saline Lock 0 No Gait/Training Normal/bed rest/immobile 0 Yes Weak 0 No Impaired 0 No Mental Status Oriented to own ability 0 Yes Electronic Signature(s) Signed: 09/10/2017 4:58:29 PM By: Roger Shelter Entered By: Roger Shelter on 09/10/2017 13:14:59 Angela Patterson ([id number]) -------------------------------------------------------------------------------- Foot Assessment Details Patient Name: Angela Patterson. Date of Service: 09/10/2017 12:30 PM Medical Record Number: [id number] Patient Account Number: [id number] Date of Birth/Sex: 10-18-1920 (84 y.o. Female) Treating RN: Roger Shelter Primary Care Gerianne [name]: Einar Pheasant Other Clinician: Referring Makya Yurko: Referral, Self Treating Lindsee Labarre/Extender: Melburn Hake, HOYT Weeks in Treatment: 0 Foot Assessment Items Site Locations + = Sensation present, - = Sensation absent, C = Callus, U = Ulcer R = Redness, W = Warmth, M = Maceration, PU = Pre-ulcerative lesion F = Fissure, S = Swelling, D = Dryness Assessment Right: Left: Other Deformity: No No Prior Foot Ulcer: No No Prior Amputation: No No Charcot Joint: No No Ambulatory Status: Ambulatory Without Help Gait:  Steady Electronic Signature(s) Signed: 09/10/2017 4:58:29 PM By: Roger Shelter Entered By: Roger Shelter on 09/10/2017 [name], [name] ([id number]) -------------------------------------------------------------------------------- Nutrition Risk Assessment Details Patient Name: Angela Patterson. Date of Service: 09/10/2017 12:30 PM Medical Record Number: [id number] Patient Account Number: [id number] Date of Birth/Sex: 14-Apr-1921 (84 y.o. Female) Treating RN: Roger Shelter Primary Care Chloe Miyoshi: Einar Pheasant Other Clinician: Referring Silverio Hagan: Referral, Self Treating Deolinda Frid/Extender: STONE III, HOYT Weeks in Treatment: 0 Height (in): 65 Weight (lbs): 107 Body Mass Index (BMI): 17.8 Nutrition Risk Assessment Items NUTRITION RISK SCREEN: I have an illness or condition that made me change the kind and/or amount of 0 No food I eat I eat fewer than two meals per day 0 No I eat few fruits and vegetables, or milk products 0 No I have three or more drinks of beer, liquor or wine almost every day 0 No I have tooth or mouth problems that make it hard for me to eat 0 No I don't always have enough money to buy the food I need 0 No I eat alone most of the time 0 No I take three or more different prescribed or over-the-counter drugs a day 0 No Without wanting to, I have lost or gained 10 pounds in the last six months 0 No I am not always physically able to shop, cook and/or feed myself 0 No Nutrition Protocols Good Risk Protocol 0 No interventions needed Moderate Risk Protocol Electronic Signature(s) Signed: 09/10/2017 4:58:29 PM By: Roger Shelter Entered By: Roger Shelter on 09/10/2017 13:15:06

## 2017-09-12 NOTE — Progress Notes (Signed)
MARISE, KNAPPER ([id number]) Visit Report for 09/10/2017 Chief Complaint Document Details Patient Name: Angela Patterson, Angela Patterson. Date of Service: 09/10/2017 12:30 PM Medical Record Number: [id number] Patient Account Number: [id number] Date of Birth/Sex: 1921/06/08 (84 y.o. Female) Treating RN: Montey Hora Primary Care Provider: Einar Pheasant Other Clinician: Referring Provider: Referral, Self Treating Provider/Extender: Melburn Hake, HOYT Weeks in Treatment: 0 Information Obtained from: Patient Chief Complaint Left plantar foot ulcer Electronic Signature(s) Signed: 09/11/2017 12:37:14 PM By: Worthy Keeler PA-C Entered By: Worthy Keeler on 09/10/2017 13:20:07 Angela Patterson ([id number]) -------------------------------------------------------------------------------- Debridement Details Patient Name: Angela Patterson. Date of Service: 09/10/2017 12:30 PM Medical Record Number: [id number] Patient Account Number: [id number] Date of Birth/Sex: Jul 06, 1921 (84 y.o. Female) Treating RN: Montey Hora Primary Care Provider: Einar Pheasant Other Clinician: Referring Provider: Referral, Self Treating Provider/Extender: Melburn Hake, HOYT Weeks in Treatment: 0 Debridement Performed for Wound #4 Left,Plantar Foot Assessment: Performed By: Physician STONE III, HOYT E., PA-C Debridement: Debridement Pre-procedure Verification/Time Yes - 13:41 Out Taken: Start Time: 13:41 Pain Control: Lidocaine 4% Topical Solution Level: Skin/Subcutaneous Tissue Total Area Debrided (L x W): 1.5 (cm) x 1.5 (cm) = 2.25 (cm) Tissue and other material Viable, Non-Viable, Callus, Fibrin/Slough, Subcutaneous debrided: Instrument: Curette Bleeding: Minimum Hemostasis Achieved: Pressure End Time: 13:53 Procedural Pain: 0 Post Procedural Pain: 0 Response to Treatment: Procedure was tolerated well Post Debridement Measurements of Total Wound Length: (cm) 1 Stage: Category/Stage IV Width: (cm) 0.9 Depth: (cm)  0.4 Volume: (cm) 0.283 Character of Wound/Ulcer Post Improved Debridement: Post Procedure Diagnosis Same as Pre-procedure Electronic Signature(s) Signed: 09/10/2017 5:21:02 PM By: Montey Hora Signed: 09/11/2017 12:37:14 PM By: Worthy Keeler PA-C Entered By: Montey Hora on 09/10/2017 13:56:01 Angela Patterson ([id number]) -------------------------------------------------------------------------------- HPI Details Patient Name: Angela Patterson. Date of Service: 09/10/2017 12:30 PM Medical Record Number: [id number] Patient Account Number: [id number] Date of Birth/Sex: 06/08/1921 (84 y.o. Female) Treating RN: Montey Hora Primary Care Provider: Einar Pheasant Other Clinician: Referring Provider: Referral, Self Treating Provider/Extender: Melburn Hake, HOYT Weeks in Treatment: 0 History of Present Illness HPI Description: this very pleasant 84 year old patient who is extremely active and ambulating all day has had problems with the left foot for over a year. Her daughter who is her caregiver says she drives around and even goes around and does her chores by herself. this is the third recurrence and after the last time we saw in June and discharged her her daughter has got her to pace of diabetic shoes and she's been wearing these regularly. She has been on the shoes for about 6 months now. Her diabetes is under control and her blood sugars run from a anywhere between 110 to 130 and she checks them twice a day. no fever or discharge. During her initial visit, an x-ray of her left foot was done on 09/26/2014. There was no cortical erosion noted there was mild osteopenia in the distal first metatarsal. Radiologist has recommended a MRI of the left foot to exclude osteomyelitis. MRI done on 10/09/2014 reveals that the area where she had a phlegmon is without osteomyelitis. the bone marrow and other areas are compatible with reactive edema rather than osteomyelitis. 08/06/2015 -- x-ray  of the left foot done on 07/30/2015 shows IMPRESSION:Soft tissue wound with a bandage is noted over the plantar aspect of the distal left foot. Diffuse osteopenia degenerative change. No acute bony abnormality 09/16/2015 -- last week she had gone [facility] and met with Harrington Challenger who had spoken  to me over the phone. Instead of a DH walking boot he has given her an offloading boot which will make her more steady and this boot is called DH healing sandal with a custom-made insole to offload this area. 09/10/17 on evaluation today patient presents for reevaluation in regard to her left plantar foot ulcer has actually been since 2017 since we have last seen her. With that being said she has seem to do fairly well in the interim since that time. Patient is seen with both her daughter as well as her son present for today's evaluation. Her son tells me that they have ordered diabetic shoes for her which obviously are custom-made and that they were hoping these would be available today to bring in. With that being said she does also have some of the boots left that she had previously for offloading and they brought those with them today as well. With that being said up to this point patient has just been using her tennis shoes in regard to her footwear. She has had drainage from the ulcer and it really does not seem to be improving significantly at this time. She does have a history of diabetes mellitus type II which she is on medication and she is also on a blood thinner. Fortunately she does not seem to have any evidence of discomfort due to the diabetic neuropathy. Patient does have a very high arch which is why she seems to get pressure to this area of the first metatarsal on the left foot leading to the ulcerations that develop. The current ulceration is in the exact location of her prior ulceration. She has not had a recent x-ray. Electronic Signature(s) Signed:  09/11/2017 12:37:14 PM By: Worthy Keeler PA-C Entered By: Worthy Keeler on 09/11/2017 12:27:08 Angela Patterson ([id number]) -------------------------------------------------------------------------------- Physical Exam Details Patient Name: Angela Patterson. Date of Service: 09/10/2017 12:30 PM Medical Record Number: [id number] Patient Account Number: [id number] Date of Birth/Sex: [birth year] (84 y.o. Female) Treating RN: Montey Hora Primary Care Provider: Einar Pheasant Other Clinician: Referring Provider: Referral, Self Treating Provider/Extender: STONE III, HOYT Weeks in Treatment: 0 Constitutional patient is hypotensive.. pulse regular and within target range for patient.Marland Kitchen respirations regular, non-labored and within target range for patient.Marland Kitchen temperature within target range for patient.. Well-nourished and well-hydrated in no acute distress. Eyes conjunctiva clear no eyelid edema noted. pupils equal round and reactive to light and accommodation. Ears, Nose, Mouth, and Throat no gross abnormality of ear auricles or external auditory canals. patient has hearing loss. mucus membranes moist. Respiratory normal breathing without difficulty. clear to auscultation bilaterally. Cardiovascular regular rate and rhythm with normal S1, S2. 1+ dorsalis pedis/posterior tibialis pulses. no clubbing, cyanosis, significant edema, <3 sec cap refill. Gastrointestinal (GI) soft, non-tender, non-distended, +BS. no ventral hernia noted. Musculoskeletal normal gait and posture. Psychiatric this patient is able to make decisions and demonstrates good insight into disease process. Alert and Oriented x 3. pleasant and cooperative. Notes Patient's wound bed on evaluation today showed evidence of prior contusion that was embedded within the callous although when debridement was performed after obtaining verbal informed consent as well is having the consent form signed and discussing the risk and  benefits it was determined that debridement would be preferable to try to get this ulcer to heal. I did explain that this would be likely a larger wound as far as what was exposed but that hopefully will be one that can heal much better. Post debridement the wound did  appear to be much better with a healthy granular bed this did not proceed down to bone as far as the depth of the wound. However due to the substantial debridement required patient did have bleeding this was probably due to the fact that she is on blood thinners as well and subsequently I did have to use silver nitrate to cauterize the wound bed in order to stop the bleeding. I explained to patient's daughter that obviously this should not be as big of an issue in the future as the debridement will not be as significant going forward obviously. We are gonna see were things stand in one weeks time. I am gonna have the patient bring her Juxta-Lite compression for her next visit in case there still seems to be issues with it she knew from the [employer] wrap at the top of the dressing we will see about just transitioning her to the [place]. Fortunately her wounds do seem to be doing better other than the new areas of friction on the medial malleolus. Patient is in agreement with plan. Please see above for specific wound care orders. We will see patient for re-evaluation in 1 week(s) here in the clinic. If anything worsens or changes patient will contact our office for additional recommendations. Electronic Signature(s) Signed: 09/11/2017 12:37:14 PM By: Worthy Keeler PA-C Entered By: Worthy Keeler on 09/11/2017 12:30:45 Angela Patterson ([id number]) -------------------------------------------------------------------------------- Physician Orders Details Patient Name: Angela Patterson. Date of Service: 09/10/2017 12:30 PM Medical Record Number: [id number] Patient Account Number: [id number] Date of Birth/Sex: 1921-06-13 (84 y.o.  Female) Treating RN: Montey Hora Primary Care Provider: Einar Pheasant Other Clinician: Referring Provider: Referral, Self Treating Provider/Extender: Melburn Hake, HOYT Weeks in Treatment: 0 Verbal / Phone Orders: No Diagnosis Coding ICD-10 Coding Code Description E11.621 Type 2 diabetes mellitus with 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 with fat layer exposed E11.40 Type 2 diabetes mellitus with diabetic neuropathy, unspecified L84 Corns and callosities I10 Essential (primary) hypertension Wound Cleansing Wound #4 Left,Plantar Metatarsal head first o Clean wound with Normal Saline. o May Shower, gently pat wound dry prior to applying new dressing. Anesthetic (add to Medication List) Wound #4 Left,Plantar Metatarsal head first o Topical Lidocaine 4% cream applied to wound bed prior to debridement (In Clinic Only). Primary Wound Dressing Wound #4 Left,Plantar Metatarsal head first o [place]cel Non-Adherent Secondary Dressing Wound #4 Left,Plantar Metatarsal head first o Dry Gauze o Boardered Foam Dressing Dressing Change Frequency Wound #4 Left,Plantar Metatarsal head first o Change dressing every other day. Follow-up Appointments Wound #4 Left,Plantar Metatarsal head first o Return Appointment in 1 week. Additional Orders / Instructions Wound #4 Left,Plantar Metatarsal head first o Increase protein intake. o Activity as tolerated Angela Patterson, Angela Patterson ([id number]) Electronic Signature(s) Signed: 09/10/2017 5:21:02 PM By: Montey Hora Signed: 09/11/2017 12:37:14 PM By: Worthy Keeler PA-C Entered By: Montey Hora on 09/10/2017 13:57:48 Angela Patterson ([id number]) -------------------------------------------------------------------------------- Problem List Details Patient Name: Angela Patterson. Date of Service: 09/10/2017 12:30 PM Medical Record Number: [id number] Patient Account Number: [id number] Date of Birth/Sex:  Nov 17, 1920 (84 y.o. Female) Treating RN: Montey Hora Primary Care Provider: Einar Pheasant Other Clinician: Referring Provider: Referral, Self Treating Provider/Extender: Melburn Hake, HOYT Weeks in Treatment: 0 Active Problems ICD-10 Encounter Code Description Active Date Diagnosis E11.621 Type 2 diabetes mellitus with foot ulcer 09/10/2017 Yes L97.522 Non-pressure chronic ulcer of other part of left foot with fat layer 09/10/2017 Yes exposed E11.40 Type 2 diabetes  mellitus with diabetic neuropathy, unspecified 09/10/2017 Yes L84 Corns and callosities 09/10/2017 Yes I10 Essential (primary) hypertension 09/10/2017 Yes Inactive Problems Resolved Problems Electronic Signature(s) Signed: 09/11/2017 12:37:14 PM By: Worthy Keeler PA-C Entered By: Worthy Keeler on 09/10/2017 13:19:44 Angela Patterson ([id number]) -------------------------------------------------------------------------------- Progress Note Details Patient Name: Angela Patterson. Date of Service: 09/10/2017 12:30 PM Medical Record Number: [id number] Patient Account Number: [id number] Date of Birth/Sex: 1921/01/22 (84 y.o. Female) Treating RN: Montey Hora Primary Care Provider: Einar Pheasant Other Clinician: Referring Provider: Referral, Self Treating Provider/Extender: Melburn Hake, HOYT Weeks in Treatment: 0 Subjective Chief Complaint Information obtained from Patient Left plantar foot ulcer History of Present Illness (HPI) this very pleasant 84 year old patient who is extremely active and ambulating all day has had problems with the left foot for over a year. Her daughter who is her caregiver says she drives around and even goes around and does her chores by herself. this is the third recurrence and after the last time we saw in June and discharged her her daughter has got her to pace of diabetic shoes and she's been wearing these regularly. She has been on the shoes for about 6 months now. Her diabetes is under  control and her blood sugars run from a anywhere between 110 to 130 and she checks them twice a day. no fever or discharge. During her initial visit, an x-ray of her left foot was done on 09/26/2014. There was no cortical erosion noted there was mild osteopenia in the distal first metatarsal. Radiologist has recommended a MRI of the left foot to exclude osteomyelitis. MRI done on 10/09/2014 reveals that the area where she had a phlegmon is without osteomyelitis. the bone marrow and other areas are compatible with reactive edema rather than osteomyelitis. 08/06/2015 -- x-ray of the left foot done on 07/30/2015 shows IMPRESSION:Soft tissue wound with a bandage is noted over the plantar aspect of the distal left foot. Diffuse osteopenia degenerative change. No acute bony abnormality 09/16/2015 -- last week she had gone to [facility] and met with Harrington Challenger who had spoken to me over the phone. Instead of a DH walking boot he has given her an offloading boot which will make her more steady and this boot is called DH healing sandal with a custom-made insole to offload this area. 09/10/17 on evaluation today patient presents for reevaluation in regard to her left plantar foot ulcer has actually been since 2017 since we have last seen her. With that being said she has seem to do fairly well in the interim since that time. Patient is seen with both her daughter as well as her son present for today's evaluation. Her son tells me that they have ordered diabetic shoes for her which obviously are custom-made and that they were hoping these would be available today to bring in. With that being said she does also have some of the boots left that she had previously for offloading and they brought those with them today as well. With that being said up to this point patient has just been using her tennis shoes in regard to her footwear. She has had drainage from the ulcer and it really  does not seem to be improving significantly at this time. She does have a history of diabetes mellitus type II which she is on medication and she is also on a blood thinner. Fortunately she does not seem to have any evidence of discomfort due to the diabetic neuropathy. Patient  does have a very high arch which is why she seems to get pressure to this area of the first metatarsal on the left foot leading to the ulcerations that develop. The current ulceration is in the exact location of her prior ulceration. She has not had a recent x-ray. Wound History Patient presents with 1 open wound that has been present for approximately one month open. Patient has been treating wound in the following manner: wound cleanser. The wound has been healed in the past but has re-opened. Laboratory tests have not been performed in the last month. Patient reportedly has not tested positive for an antibiotic resistant organism. Patient reportedly has not tested positive for osteomyelitis. Patient reportedly has not had testing performed to evaluate circulation in the legs. Angela Patterson, Angela Patterson ([id number]) Patient History Information obtained from Patient. Allergies Iodinated Contrast Media - IV Dye Family History Cancer - Father, Diabetes - Siblings, Stroke - Mother, No family history of Heart Disease, Hereditary Spherocytosis, Hypertension, Kidney Disease, Lung Disease, Seizures, Thyroid Problems, Tuberculosis. Social History Never smoker, Marital Status - Widowed, Alcohol Use - Never, Drug Use - No History, Caffeine Use - Daily. Medical History Endocrine Patient has history of Type II Diabetes Patient is treated with Controlled Diet, Oral Agents. Blood sugar is tested. Blood sugar results noted at the following times: Bedtime - 115. Medical And Surgical History Notes Cardiovascular TIA Neurologic TIA Review of Systems (ROS) Eyes Complains or has symptoms of Glasses / Contacts -  glasses. Ear/Nose/Mouth/Throat Complains or has symptoms of Difficult clearing ears. Denies complaints or symptoms of Sinusitis, bilateral hearing aides Hematologic/Lymphatic The patient has no complaints or symptoms. Respiratory The patient has no complaints or symptoms. Cardiovascular Denies complaints or symptoms of Chest pain, LE edema. Gastrointestinal Complains or has symptoms of Frequent diarrhea. Denies complaints or symptoms of Nausea, Vomiting. Genitourinary Complains or has symptoms of Incontinence/dribbling. Denies complaints or symptoms of Kidney failure/ Dialysis. Integumentary (Skin) Complains or has symptoms of Wounds, Bleeding or bruising tendency. Denies complaints or symptoms of Breakdown, Swelling. Musculoskeletal Denies complaints or symptoms of Muscle Pain, Muscle Weakness. Neurologic Complains or has symptoms of Numbness/parasthesias. Denies complaints or symptoms of Focal/Weakness. Angela Patterson, Angela Patterson. ([id number]) Objective Constitutional patient is hypotensive.. pulse regular and within target range for patient.Marland Kitchen respirations regular, non-labored and within target range for patient.Marland Kitchen temperature within target range for patient.. Well-nourished and well-hydrated in no acute distress. Vitals Time Taken: 12:48 PM, Height: 65 in, Source: Measured, Weight: 107 lbs, Source: Measured, BMI: 17.8, Temperature: 97.4 F, Pulse: 81 bpm, Respiratory Rate: 16 breaths/min, Blood Pressure: 181/75 mmHg. Eyes conjunctiva clear no eyelid edema noted. pupils equal round and reactive to light and accommodation. Ears, Nose, Mouth, and Throat no gross abnormality of ear auricles or external auditory canals. patient has hearing loss. mucus membranes moist. Respiratory normal breathing without difficulty. clear to auscultation bilaterally. Cardiovascular regular rate and rhythm with normal S1, S2. 1+ dorsalis pedis/posterior tibialis pulses. no clubbing, cyanosis,  significant edema, Gastrointestinal (GI) soft, non-tender, non-distended, +BS. no ventral hernia noted. Musculoskeletal normal gait and posture. Psychiatric this patient is able to make decisions and demonstrates good insight into disease process. Alert and Oriented x 3. pleasant and cooperative. General Notes: Patient's wound bed on evaluation today showed evidence of prior contusion that was embedded within the callous although when debridement was performed after obtaining verbal informed consent as well is having the consent form signed and discussing the risk and benefits it was determined that debridement would be preferable to try to  get this ulcer to heal. I did explain that this would be likely a larger wound as far as what was exposed but that hopefully will be one that can heal much better. Post debridement the wound did appear to be much better with a healthy granular bed this did not proceed down to bone as far as the depth of the wound. However due to the substantial debridement required patient did have bleeding this was probably due to the fact that she is on blood thinners as well and subsequently I did have to use silver nitrate to cauterize the wound bed in order to stop the bleeding. I explained to patient's daughter that obviously this should not be as big of an issue in the future as the debridement will not be as significant going forward obviously. We are gonna see were things stand in one weeks time. I am gonna have the patient bring her Juxta-Lite compression for her next visit in case there still seems to be issues with it she knew from the [employer] wrap at the top of the dressing we will see about just transitioning her to the [place]. Fortunately her wounds do seem to be doing better other than the new areas of friction on the medial malleolus. Patient is in agreement with plan. Please see above for specific wound care orders. We will see patient for  re-evaluation in 1 week(s) here in the clinic. If anything worsens or changes patient will contact our office for additional recommendations. Integumentary (Hair, Skin) Wound #4 status is Open. Original cause of wound was Pressure Injury. The wound is located on the Left,Plantar Metatarsal head first. The wound measures 1.5cm length x 1.5cm width x 0.5cm depth; 1.767cm^2 area and 0.884cm^3 volume. There is Fat Layer (Subcutaneous Tissue) Exposed and fascia exposed. There is no tunneling or undermining noted. There is a large amount of serosanguineous drainage noted. The wound margin is indistinct and nonvisible. There is small (1-33%) red granulation within the wound bed. There is a large (67-100%) amount of necrotic tissue within the wound bed including Eschar and Adherent Slough. The periwound skin appearance exhibited: Callus, Dry/Scaly, Maceration. The periwound skin Angela Patterson, Angela Patterson. ([id number]) appearance did not exhibit: Crepitus, Excoriation, Induration, Rash, Scarring, Atrophie Blanche, Cyanosis, Ecchymosis, Hemosiderin Staining, Mottled, Pallor, Rubor, Erythema. Periwound temperature was noted as No Abnormality. General Notes: black under mark area ridge of wound noted at 1.5cm from 11 o clock area of wound. area also at the 7 oclock area a black area noted of 1.5 cm Assessment Active Problems ICD-10 E11.621 - Type 2 diabetes mellitus with foot ulcer L97.522 - Non-pressure chronic ulcer of other part of left foot with fat layer exposed E11.40 - Type 2 diabetes mellitus with diabetic neuropathy, unspecified L84 - Corns and callosities I10 - Essential (primary) hypertension Procedures Wound #4 Pre-procedure diagnosis of Wound #4 is a Pressure Ulcer located on the Left,Plantar Foot . There was a Skin/Subcutaneous Tissue Debridement ([id number]) debridement with total area of 2.25 sq cm performed by STONE III, HOYT E., PA-C. with the following instrument(s): Curette to remove Viable  and Non-Viable tissue/material including Fibrin/Slough, Callus, and Subcutaneous after achieving pain control using Lidocaine 4% Topical Solution. A time out was conducted at 13:41, prior to the start of the procedure. A Minimum amount of bleeding was controlled with Pressure. The procedure was tolerated well with a pain level of 0 throughout and a pain level of 0 following the procedure. Post Debridement Measurements: 1cm length x 0.9cm width  x 0.4cm depth; 0.283cm^3 volume. Post debridement Stage noted as Category/Stage IV. Character of Wound/Ulcer Post Debridement is improved. Post procedure Diagnosis Wound #4: Same as Pre-Procedure Plan Wound Cleansing: Wound #4 Left,Plantar Metatarsal head first: Clean wound with Normal Saline. May Shower, gently pat wound dry prior to applying new dressing. Anesthetic (add to Medication List): Wound #4 Left,Plantar Metatarsal head first: Topical Lidocaine 4% cream applied to wound bed prior to debridement (In Clinic Only). Primary Wound Dressing: Wound #4 Left,Plantar Metatarsal head first: Silvercel Non-Adherent Secondary Dressing: Wound #4 Left,Plantar Metatarsal head first: Dry Gauze Boardered Foam Dressing Angela Patterson, Angela Patterson ([id number]) Dressing Change Frequency: Wound #4 Left,Plantar Metatarsal head first: Change dressing every other day. Follow-up Appointments: Wound #4 Left,Plantar Metatarsal head first: Return Appointment in 1 week. Additional Orders / Instructions: Wound #4 Left,Plantar Metatarsal head first: Increase protein intake. Activity as tolerated At this point I am going to recommend that we initiate treatment with a silver alginate dressing for this week due to the potential drainage and possibly even bleeding that may occur. Hopefully with the silver nitrate that will not be much bleeding it all however. We will see were things stand in one weeks time when I see her for reevaluation. Hopefully she will be getting her  diabetic shoes in the interim. I'm also going to suggest to her where the peg assist like offloading shoe as this will definitely take pressure off of the pressure point of this metatarsal head on the plantar surface. Patient's daughter and patient are in agreement with this plan. We will see her back in one week and hopefully by that time she will also have a diabetic shoes for [place] to evaluate and inspect. Hopefully just using the alginate along with the Boarder Foam Dressing will make this easy for her to change at home apparently she does still live at home alone. Please see above for specific wound care orders. We will see patient for re-evaluation in 1 week(s) here in the clinic. If anything worsens or changes patient will contact our office for additional recommendations. Electronic Signature(s) Signed: 09/11/2017 12:37:14 PM By: Worthy Keeler PA-C Entered By: Worthy Keeler on 09/11/2017 12:32:02 Angela Patterson ([id number]) -------------------------------------------------------------------------------- ROS/PFSH Details Patient Name: Angela Patterson. Date of Service: 09/10/2017 12:30 PM Medical Record Number: [id number] Patient Account Number: [id number] Date of Birth/Sex: Sep 20, 1920 (84 y.o. Female) Treating RN: Roger Shelter Primary Care Provider: Einar Pheasant Other Clinician: Referring Provider: Referral, Self Treating Provider/Extender: Melburn Hake, HOYT Weeks in Treatment: 0 Information Obtained From Patient Wound History Do you currently have one or more open woundso Yes How many open wounds do you currently haveo 1 Approximately how long have you had your woundso one month open How have you been treating your wound(s) until nowo wound cleanser Has your wound(s) ever healed and then re-openedo Yes Have you had any lab work done in the past montho No Have you tested positive for an antibiotic resistant organism (MRSA, VRE)o No Have you tested positive for osteomyelitis  (bone infection)o No Have you had any tests for circulation on your legso No Eyes Complaints and Symptoms: Positive for: Glasses / Contacts - glasses Medical History: Positive for: Cataracts - removed "a few years ago" (B ); Glaucoma Ear/Nose/Mouth/Throat Complaints and Symptoms: Positive for: Difficult clearing ears Negative for: Sinusitis Review of System Notes: bilateral hearing aides Medical History: Negative for: Chronic sinus problems/congestion; Middle ear problems Hematologic/Lymphatic Complaints and Symptoms: No Complaints or Symptoms Complaints and Symptoms: Negative for: Bleeding /  Clotting Disorders; Human Immunodeficiency Virus Medical History: Negative for: Anemia; Hemophilia; Human Immunodeficiency Virus; Lymphedema; Sickle Cell Disease Respiratory Complaints and Symptoms: No Complaints or Symptoms Complaints and Symptoms: Negative for: Chronic or frequent coughs; Shortness of Breath Angela Patterson, Angela Patterson ([id number]) Medical History: Negative for: Aspiration; Asthma; Chronic Obstructive Pulmonary Disease (COPD); Pneumothorax; Sleep Apnea; Tuberculosis Cardiovascular Complaints and Symptoms: Negative for: Chest pain; LE edema Medical History: Positive for: Hypertension Past Medical History Notes: TIA Gastrointestinal Complaints and Symptoms: Positive for: Frequent diarrhea Negative for: Nausea; Vomiting Medical History: Negative for: Cirrhosis ; Crohnos; Hepatitis A; Hepatitis B; Hepatitis C Genitourinary Complaints and Symptoms: Positive for: Incontinence/dribbling Negative for: Kidney failure/ Dialysis Medical History: Negative for: End Stage Renal Disease Integumentary (Skin) Complaints and Symptoms: Positive for: Wounds; Bleeding or bruising tendency Negative for: Breakdown; Swelling Medical History: Positive for: History of pressure wounds Negative for: History of Burn Musculoskeletal Complaints and Symptoms: Negative for: Muscle Pain; Muscle  Weakness Medical History: Negative for: Gout; Rheumatoid Arthritis; Osteoarthritis; Osteomyelitis Neurologic Complaints and Symptoms: Positive for: Numbness/parasthesias Negative for: Focal/Weakness Medical History: Positive for: Neuropathy Past Medical History NotesMarland Kitchen Angela Patterson, Angela Patterson ([id number]) TIA Endocrine Medical History: Positive for: Type II Diabetes Time with diabetes: 2 years Treated with: Oral agents, Diet Blood sugar tested every day: Yes Tested : twice Blood sugar testing results: Bedtime: 115 Immunological Medical History: Negative for: Lupus Erythematosus; Raynaudos; Scleroderma Oncologic Medical History: Negative for: Received Chemotherapy; Received Radiation HBO Extended History Items Eyes: Eyes: Cataracts Glaucoma Immunizations Pneumococcal Vaccine: Received Pneumococcal Vaccination: Yes Implantable Devices Family and Social History Cancer: Yes - Father; Diabetes: Yes - Siblings; Heart Disease: No; Hereditary Spherocytosis: No; Hypertension: No; Kidney Disease: No; Lung Disease: No; Seizures: No; Stroke: Yes - Mother; Thyroid Problems: No; Tuberculosis: No; Never smoker; Marital Status - Widowed; Alcohol Use: Never; Drug Use: No History; Caffeine Use: Daily; Financial Concerns: No; Food, Clothing or Shelter Needs: No; Support System Lacking: No; Transportation Concerns: No; Advanced Directives: Yes (Not Provided); Patient does not want information on Advanced Directives; Do not resuscitate: No; Living Will: Yes (Copy provided); Medical Power of Attorney: Yes - Olen Cordial- daughter (Copy provided) Electronic Signature(s) Signed: 09/10/2017 4:58:29 PM By: Roger Shelter Signed: 09/11/2017 12:37:14 PM By: Worthy Keeler PA-C Entered By: Roger Shelter on 09/10/2017 13:12:43 Angela Patterson ([id number]) -------------------------------------------------------------------------------- SuperBill Details Patient Name: Angela Patterson. Date of Service:  09/10/2017 Medical Record Number: [id number] Patient Account Number: [id number] Date of Birth/Sex: 10-27-1920 (84 y.o. Female) Treating RN: Montey Hora Primary Care Provider: Einar Pheasant Other Clinician: Referring Provider: Referral, Self Treating Provider/Extender: Melburn Hake, HOYT Weeks in Treatment: 0 Diagnosis Coding ICD-10 Codes Code Description E11.621 Type 2 diabetes mellitus with 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 with fat layer exposed E11.40 Type 2 diabetes mellitus with diabetic neuropathy, unspecified L84 Corns and callosities I10 Essential (primary) hypertension Facility Procedures CPT4 Code: 10258527 Description: 99213 - WOUND CARE VISIT-LEV 3 EST PT Modifier: Quantity: 1 CPT4 Code: 78242353 Description: 11042 - DEB SUBQ TISSUE 20 SQ CM/< ICD-10 Diagnosis Description L97.522 Non-pressure chronic ulcer of other part of left foot with fat Modifier: layer exposed Quantity: 1 Physician Procedures CPT4 Code: 6144315 Description: 99214 - WC PHYS LEVEL 4 - EST PT ICD-10 Diagnosis Description E11.621 Type 2 diabetes mellitus with 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 with fat E11.40 Type 2 diabetes mellitus with diabetic neuropathy,  unspecified L84 Corns and callosities Modifier: 25 layer exposed Quantity: 1 CPT4 Code:  1427670 Description: 11042 - WC PHYS SUBQ TISS 20 SQ CM ICD-10 Diagnosis Description L97.522 Non-pressure chronic ulcer of other part of left foot with fat Modifier: layer exposed Quantity: 1 Electronic Signature(s) Signed: 09/11/2017 12:37:14 PM By: Worthy Keeler PA-C Entered By: Worthy Keeler on 09/11/2017 12:32:30

## 2017-09-16 NOTE — Progress Notes (Signed)
PHILENA, OBEY ([id number]) Visit Report for 09/10/2017 Allergy List Details Patient Name: HOLY, BATTENFIELD. Date of Service: 09/10/2017 12:30 PM Medical Record Number: [id number] Patient Account Number: [id number] Date of Birth/Sex: 09-Nov-1920 (84 y.o. Female) Treating RN: Roger Shelter Primary Care Danise Dehne: Einar Pheasant Other Clinician: Referring Destine Zirkle: Referral, Self Treating Dorrene Bently/Extender: STONE III, HOYT Weeks in Treatment: 0 Allergies Active Allergies Iodinated Contrast Media - IV Dye Allergy Notes Electronic Signature(s) Signed: 09/10/2017 4:58:29 PM By: Roger Shelter Entered By: Roger Shelter on 09/10/2017 13:04:18 [name] ([id number]) -------------------------------------------------------------------------------- Arrival Information Details Patient Name: [name] Date of Service: 09/10/2017 12:30 PM Medical Record Number: [id number] Patient Account Number: [id number] Date of Birth/Sex: 08-27-1920 (84 y.o. Female) Treating RN: Roger Shelter Primary Care Chirag Krueger: Einar Pheasant Other Clinician: Referring Samaira Holzworth: Referral, Self Treating Talya Quain/Extender: Melburn Hake, HOYT Weeks in Treatment: 0 Visit Information Patient Arrived: Ambulatory Arrival Time: 12:46 Accompanied By: son and daughter Transfer Assistance: None Patient Identification Verified: Yes Secondary Verification Process Yes Completed: Patient Has Alerts: Yes Patient Alerts: DMII History Since Last Visit All ordered tests and consults were completed: No Electronic Signature(s) Signed: 09/10/2017 5:21:02 PM By: Montey Hora Entered By: Montey Hora on 09/10/2017 13:40:16 [name] ([id number]) -------------------------------------------------------------------------------- Clinic Level of Care Assessment Details Patient Name: [name] Date of Service: 09/10/2017 12:30 PM Medical Record Number: [id number] Patient Account Number: [id number] Date of  Birth/Sex: 04-May-1921 (84 y.o. Female) Treating RN: Montey Hora Primary Care Kemari Narez: Einar Pheasant Other Clinician: Referring Hana Trippett: Referral, Self Treating Shulem Mader/Extender: Melburn Hake, HOYT Weeks in Treatment: 0 Clinic Level of Care Assessment Items TOOL 1 Quantity Score []  - Use when EandM and Procedure is performed on INITIAL visit 0 ASSESSMENTS - Nursing Assessment / Reassessment X - General Physical Exam (combine w/ comprehensive assessment (listed just below) when 1 20 performed on new pt. evals) X- 1 25 Comprehensive Assessment (HX, ROS, Risk Assessments, Wounds Hx, etc.) ASSESSMENTS - Wound and Skin Assessment / Reassessment []  - Dermatologic / Skin Assessment (not related to wound area) 0 ASSESSMENTS - Ostomy and/or Continence Assessment and Care []  - Incontinence Assessment and Management 0 []  - 0 Ostomy Care Assessment and Management (repouching, etc.) PROCESS - Coordination of Care X - Simple Patient / Family Education for ongoing care 1 15 []  - 0 Complex (extensive) Patient / Family Education for ongoing care X- 1 10 Staff obtains Programmer, systems, Records, Test Results / Process Orders []  - 0 Staff telephones HHA, Nursing Homes / Clarify orders / etc []  - 0 Routine Transfer to another Facility (non-emergent condition) []  - 0 Routine Hospital Admission (non-emergent condition) X- 1 15 New Admissions / Biomedical engineer / Ordering NPWT, Apligraf, etc. []  - 0 Emergency Hospital Admission (emergent condition) PROCESS - Special Needs []  - Pediatric / Minor Patient Management 0 []  - 0 Isolation Patient Management []  - 0 Hearing / Language / Visual special needs []  - 0 Assessment of Community assistance (transportation, D/C planning, etc.) []  - 0 Additional assistance / Altered mentation []  - 0 Support Surface(s) Assessment (bed, cushion, seat, etc.) MEERA, VASCO ([id number]) INTERVENTIONS - Miscellaneous []  - External ear exam 0 []  - 0 Patient  Transfer (multiple staff / Civil Service fast streamer / Similar devices) []  - 0 Simple Staple / Suture removal (25 or less) []  - 0 Complex Staple / Suture removal (26 or more) []  - 0 Hypo/Hyperglycemic Management (do not check if billed separately) X- 1 15 Ankle / Brachial Index (ABI) - do not check if billed separately Has  the patient been seen at the hospital within the last three years: Yes Total Score: 100 Level Of Care: New/Established - Level 3 Electronic Signature(s) Signed: 09/10/2017 5:21:02 PM By: Montey Hora Entered By: Montey Hora on 09/10/2017 13:43:04 [name] ([id number]) -------------------------------------------------------------------------------- Encounter Discharge Information Details Patient Name: [name] Date of Service: 09/10/2017 12:30 PM Medical Record Number: [id number] Patient Account Number: [id number] Date of Birth/Sex: 1921-06-13 (84 y.o. Female) Treating RN: Carolyne Fiscal, Debi Primary Care Cortavius Montesinos: Einar Pheasant Other Clinician: Referring Fisher Hargadon: Referral, Self Treating Corney Knighton/Extender: Melburn Hake, HOYT Weeks in Treatment: 0 Encounter Discharge Information Items Discharge Pain Level: 0 Discharge Condition: Stable Ambulatory Status: Ambulatory Discharge Destination: Home Transportation: Private Auto Accompanied By: daughter Schedule Follow-up Appointment: Yes Medication Reconciliation completed and No provided to Patient/Care Generoso Cropper: Provided on Clinical Summary of Care: 09/10/2017 Form Type Recipient Paper Patient EG Electronic Signature(s) Signed: 09/14/2017 8:07:32 AM By: Ruthine Dose Entered By: Ruthine Dose on 09/10/2017 14:19:06 [name] ([id number]) -------------------------------------------------------------------------------- Lower Extremity Assessment Details Patient Name: [name] Date of Service: 09/10/2017 12:30 PM Medical Record Number: [id number] Patient Account Number: [id number] Date of Birth/Sex:  1921-03-01 (84 y.o. Female) Treating RN: Roger Shelter Primary Care Simra Fiebig: Einar Pheasant Other Clinician: Referring Melisssa Donner: Referral, Self Treating Milinda Sweeney/Extender: Melburn Hake, HOYT Weeks in Treatment: 0 Edema Assessment Assessed: [Left: No] [Right: No] Edema: [Left: N] [Right: o] Vascular Assessment Claudication: Claudication Assessment [Left:None] Pulses: Dorsalis Pedis Doppler Audible: [Left:Yes] Posterior Tibial Palpable: [Left:Yes] Extremity colors, hair growth, and conditions: Extremity Color: [Left:Normal] Hair Growth on Extremity: [Left:Yes] Temperature of Extremity: [Left:Cool] Capillary Refill: [Left:< 3 seconds] Blood Pressure: Brachial: [Left:140] Dorsalis Pedis: 160 [Left:Dorsalis Pedis:] Ankle: Posterior Tibial: 162 [Left:Posterior Tibial: 1.16] Toe Nail Assessment Left: Right: Thick: No Discolored: No Deformed: No Improper Length and Hygiene: No Electronic Signature(s) Signed: 09/10/2017 4:58:29 PM By: Roger Shelter Entered By: Roger Shelter on 09/10/2017 13:32:51 [name] ([id number]) -------------------------------------------------------------------------------- Multi Wound Chart Details Patient Name: [name] Date of Service: 09/10/2017 12:30 PM Medical Record Number: [id number] Patient Account Number: [id number] Date of Birth/Sex: Jan 10, 1921 (84 y.o. Female) Treating RN: Montey Hora Primary Care Lundon Verdejo: Einar Pheasant Other Clinician: Referring Telisha Zawadzki: Referral, Self Treating Zennie Ayars/Extender: STONE III, HOYT Weeks in Treatment: 0 Vital Signs Height(in): 65 Pulse(bpm): 81 Weight(lbs): 107 Blood Pressure(mmHg): 181/75 Body Mass Index(BMI): 18 Temperature(F): 97.4 Respiratory Rate 16 (breaths/min): Photos: [4:No Photos] [N/A:N/A] Wound Location: [4:Left Foot - Plantar] [N/A:N/A] Wounding Event: [4:Pressure Injury] [N/A:N/A] Primary Etiology: [4:Pressure Ulcer] [N/A:N/A] Date Acquired: [4:08/20/2017]  [N/A:N/A] Weeks of Treatment: [4:0] [N/A:N/A] Wound Status: [4:Open] [N/A:N/A] Measurements L x W x D [4:1.5x1.5x0.5] [N/A:N/A] (cm) Area (cm) : [4:1.767] [N/A:N/A] Volume (cm) : [4:0.884] [N/A:N/A] % Reduction in Area: [4:0.00%] [N/A:N/A] % Reduction in Volume: [4:0.00%] [N/A:N/A] Classification: [4:Category/Stage IV] [N/A:N/A] Exudate Amount: [4:Large] [N/A:N/A] Exudate Type: [4:Serosanguineous] [N/A:N/A] Exudate Color: [4:red, brown] [N/A:N/A] Wound Margin: [4:Indistinct, nonvisible] [N/A:N/A] Granulation Amount: [4:Small (1-33%)] [N/A:N/A] Granulation Quality: [4:Red] [N/A:N/A] Necrotic Amount: [4:Large (67-100%)] [N/A:N/A] Necrotic Tissue: [4:Eschar, Adherent Slough] [N/A:N/A] Exposed Structures: [4:Fascia: Yes Fat Layer (Subcutaneous Tissue) Exposed: Yes Tendon: No Muscle: No Joint: No Bone: No] [N/A:N/A] Epithelialization: [4:None] [N/A:N/A] Periwound Skin Texture: [4:Callus: Yes Excoriation: No Induration: No Crepitus: No Rash: No Scarring: No] [N/A:N/A] Periwound Skin Moisture: [N/A:N/A] Maceration: Yes Dry/Scaly: Yes Periwound Skin Color: Atrophie Blanche: No N/A N/A Cyanosis: No Ecchymosis: No Erythema: No Hemosiderin Staining: No Mottled: No Pallor: No Rubor: No Temperature: No Abnormality N/A N/A Tenderness on Palpation: No N/A N/A Wound Preparation: Ulcer Cleansing: N/A N/A Rinsed/Irrigated with Saline Topical  Anesthetic Applied: Other: lidocaine 4% Assessment Notes: black under mark area ridge of N/A N/A wound noted at 1.5cm from 11 o clock area of wound. area also at the 7 oclock area a black area noted of 1.5 cm Treatment Notes Electronic Signature(s) Signed: 09/10/2017 5:21:02 PM By: Montey Hora Entered By: Montey Hora on 09/10/2017 13:36:54 [name] ([id number]) -------------------------------------------------------------------------------- [place] Details Patient Name: [name] Date of Service:  09/10/2017 12:30 PM Medical Record Number: [id number] Patient Account Number: [id number] Date of Birth/Sex: 12-Mar-1921 (84 y.o. Female) Treating RN: Montey Hora Primary Care Brayton Baumgartner: Einar Pheasant Other Clinician: Referring Jaidalyn Schillo: Referral, Self Treating Beyla Loney/Extender: Melburn Hake, HOYT Weeks in Treatment: 0 Active Inactive ` Abuse / Safety / Falls / Self Care Management Nursing Diagnoses: Potential for falls Goals: Patient will not experience any injury related to falls Date Initiated: 09/10/2017 Target Resolution Date: 11/27/2017 Goal Status: Active Interventions: Assess fall risk on admission and as needed Notes: ` Orientation to the Wound Care Program Nursing Diagnoses: Knowledge deficit related to the wound healing center program Goals: Patient/caregiver will verbalize understanding of the [facility] Date Initiated: 09/10/2017 Target Resolution Date: 11/27/2017 Goal Status: Active Interventions: Provide education on orientation to the wound center Notes: ` Wound/Skin Impairment Nursing Diagnoses: Impaired tissue integrity Goals: Ulcer/skin breakdown will heal within 14 weeks Date Initiated: 09/10/2017 Target Resolution Date: 11/27/2017 Goal Status: Active Interventions: SHAINE, MOUNT ([id number]) Assess patient/caregiver ability to obtain necessary supplies Assess patient/caregiver ability to perform ulcer/skin care regimen upon admission and as needed Assess ulceration(s) every visit Notes: Electronic Signature(s) Signed: 09/10/2017 5:21:02 PM By: Montey Hora Entered By: Montey Hora on 09/10/2017 13:36:41 [name] ([id number]) -------------------------------------------------------------------------------- Pain Assessment Details Patient Name: [name] Date of Service: 09/10/2017 12:30 PM Medical Record Number: [id number] Patient Account Number: [id number] Date of Birth/Sex: [birth year] (83 y.o. Female) Treating RN:  Roger Shelter Primary Care Faris Coolman: Einar Pheasant Other Clinician: Referring Catarino Vold: Referral, Self Treating Aadya Kindler/Extender: Melburn Hake, HOYT Weeks in Treatment: 0 Active Problems Location of Pain Severity and Description of Pain Patient Has Paino No Site Locations Pain Management and Medication Current Pain Management: Electronic Signature(s) Signed: 09/10/2017 4:58:29 PM By: Roger Shelter Entered By: Roger Shelter on 09/10/2017 12:48:51 [name] ([id number]) -------------------------------------------------------------------------------- Patient/Caregiver Education Details Patient Name: [name] Date of Service: 09/10/2017 12:30 PM Medical Record Number: [id number] Patient Account Number: [id number] Date of Birth/Gender: [birth year] (84 y.o. Female) Treating RN: Carolyne Fiscal, Debi Primary Care Physician: Einar Pheasant Other Clinician: Referring Physician: Referral, Self Treating Physician/Extender: Sharalyn Ink in Treatment: 0 Education Assessment Education Provided To: Patient and Caregiver daughter Education Topics Provided Welcome To [facility] [place]: Handouts: [facility] [place] Methods: Explain/Verbal Responses: State content correctly Wound/Skin Impairment: Handouts: Caring for Your Ulcer, Other: change dressing as ordered Methods: Demonstration, Explain/Verbal Responses: State content correctly Electronic Signature(s) Signed: 09/15/2017 4:30:35 PM By: Alric Quan Entered By: Alric Quan on 09/10/2017 14:11:39 [name] ([id number]) -------------------------------------------------------------------------------- Wound Assessment Details Patient Name: [name] Date of Service: 09/10/2017 12:30 PM Medical Record Number: [id number] Patient Account Number: [id number] Date of Birth/Sex: 25-Jul-1920 (84 y.o. Female) Treating RN: Roger Shelter Primary Care Ahja Martello: Einar Pheasant Other  Clinician: Referring Daman Steffenhagen: Referral, Self Treating Irma Roulhac/Extender: STONE III, HOYT Weeks in Treatment: 0 Wound Status Wound Number: 4 Primary Pressure Ulcer Etiology: Wound Location: Left Metatarsal head first - Plantar Wound Open Wounding Event: Pressure Injury Status: Date Acquired: 08/20/2017 Comorbid Cataracts, Glaucoma, Hypertension, Type II  Weeks Of Treatment: 0 History: Diabetes, History of pressure wounds, Clustered Wound: No Neuropathy Photos Wound Measurements Length: (cm) 1.5 Width: (cm) 1.5 Depth: (cm) 0.5 Area: (cm) 1.767 Volume: (cm) 0.884 % Reduction in Area: 0% % Reduction in Volume: 0% Epithelialization: None Tunneling: No Undermining: No Wound Description Classification: Category/Stage IV Wound Margin: Indistinct, nonvisible Exudate Amount: Large Exudate Type: Serosanguineous Exudate Color: red, brown Foul Odor After Cleansing: No Slough/Fibrino Yes Wound Bed Granulation Amount: Small (1-33%) Exposed Structure Granulation Quality: Red Fascia Exposed: Yes Necrotic Amount: Large (67-100%) Fat Layer (Subcutaneous Tissue) Exposed: Yes Necrotic Quality: Eschar, Adherent Slough Tendon Exposed: No Muscle Exposed: No Joint Exposed: No Bone Exposed: No Periwound Skin Texture Texture Color ERINN, MENDOSA ([id number]) No Abnormalities Noted: No No Abnormalities Noted: No Callus: Yes Atrophie Blanche: No Crepitus: No Cyanosis: No Excoriation: No Ecchymosis: No Induration: No Erythema: No Rash: No Hemosiderin Staining: No Scarring: No Mottled: No Pallor: No Moisture Rubor: No No Abnormalities Noted: No Dry / Scaly: Yes Temperature / Pain Maceration: Yes Temperature: No Abnormality Wound Preparation Ulcer Cleansing: Rinsed/Irrigated with Saline Topical Anesthetic Applied: Other: lidocaine 4%, Assessment Notes black under mark area ridge of wound noted at 1.5cm from 11 o clock area of wound. area also at the 7 oclock area a  black area noted of 1.5 cm Treatment Notes Wound #4 (Left, Plantar Metatarsal head first) 1. Cleansed with: Clean wound with Normal Saline 2. Anesthetic Topical Lidocaine 4% cream to wound bed prior to debridement 3. Peri-wound Care: Skin Prep 4. Dressing Applied: Other dressing (specify in notes) 5. Secondary Dressing Applied Bordered Foam Dressing Notes silvercel Electronic Signature(s) Signed: 09/10/2017 2:52:01 PM By: Montey Hora Signed: 09/10/2017 4:58:29 PM By: Roger Shelter Entered By: Montey Hora on 09/10/2017 14:52:01 [name] ([id number]) -------------------------------------------------------------------------------- Vitals Details Patient Name: [name] Date of Service: 09/10/2017 12:30 PM Medical Record Number: [id number] Patient Account Number: [id number] Date of Birth/Sex: [birth year] (84 y.o. Female) Treating RN: Roger Shelter Primary Care Damare Serano: Einar Pheasant Other Clinician: Referring Kashis Penley: Referral, Self Treating Darey Hershberger/Extender: STONE III, HOYT Weeks in Treatment: 0 Vital Signs Time Taken: 12:48 Temperature (F): 97.4 Height (in): 65 Pulse (bpm): 81 Source: Measured Respiratory Rate (breaths/min): 16 Weight (lbs): 107 Blood Pressure (mmHg): 181/75 Source: Measured Reference Range: 80 - 120 mg / dl Body Mass Index (BMI): 17.8 Electronic Signature(s) Signed: 09/10/2017 4:58:29 PM By: Roger Shelter Entered By: Roger Shelter on 09/10/2017 12:49:24

## 2017-09-17 ENCOUNTER — Encounter: Payer: Medicare Other | Attending: Physician Assistant | Admitting: Physician Assistant

## 2017-09-17 DIAGNOSIS — E11621 Type 2 diabetes mellitus with foot ulcer: Secondary | ICD-10-CM | POA: Insufficient documentation

## 2017-09-17 DIAGNOSIS — Z79899 Other long term (current) drug therapy: Secondary | ICD-10-CM | POA: Diagnosis not present

## 2017-09-17 DIAGNOSIS — M8580 Other specified disorders of bone density and structure, unspecified site: Secondary | ICD-10-CM | POA: Insufficient documentation

## 2017-09-17 DIAGNOSIS — L84 Corns and callosities: Secondary | ICD-10-CM | POA: Diagnosis not present

## 2017-09-17 DIAGNOSIS — Z8673 Personal history of transient ischemic attack (TIA), and cerebral infarction without residual deficits: Secondary | ICD-10-CM | POA: Diagnosis not present

## 2017-09-17 DIAGNOSIS — Z7984 Long term (current) use of oral hypoglycemic drugs: Secondary | ICD-10-CM | POA: Diagnosis not present

## 2017-09-17 DIAGNOSIS — L97522 Non-pressure chronic ulcer of other part of left foot with fat layer exposed: Secondary | ICD-10-CM | POA: Diagnosis not present

## 2017-09-17 DIAGNOSIS — I10 Essential (primary) hypertension: Secondary | ICD-10-CM | POA: Diagnosis not present

## 2017-09-17 DIAGNOSIS — E1140 Type 2 diabetes mellitus with diabetic neuropathy, unspecified: Secondary | ICD-10-CM | POA: Diagnosis not present

## 2017-09-17 DIAGNOSIS — L89894 Pressure ulcer of other site, stage 4: Secondary | ICD-10-CM | POA: Diagnosis not present

## 2017-09-20 NOTE — Progress Notes (Signed)
Angela Patterson ([id number]) Visit Report for 09/17/2017 Chief Complaint Document Details Patient Name: Angela Patterson, Angela Patterson. Date of Service: 09/17/2017 11:00 AM Medical Record Number: [id number] Patient Account Number: [id number] Date of Birth/Sex: [birth year] (84 y.o. Female) Treating RN: Cornell Barman Primary Care Provider: Einar Pheasant Other Clinician: Referring Provider: Einar Pheasant Treating Provider/Extender: Melburn Hake, HOYT Weeks in Treatment: 1 Information Obtained from: Patient Chief Complaint Left plantar foot ulcer Electronic Signature(s) Signed: 09/18/2017 10:02:55 PM By: Worthy Keeler PA-C Entered By: Worthy Keeler on 09/17/2017 10:37:31 Angela Patterson ([id number]) -------------------------------------------------------------------------------- Debridement Details Patient Name: Angela Patterson. Date of Service: 09/17/2017 11:00 AM Medical Record Number: [id number] Patient Account Number: [id number] Date of Birth/Sex: [birth year] (84 y.o. Female) Treating RN: Cornell Barman Primary Care Provider: Einar Pheasant Other Clinician: Referring Provider: Einar Pheasant Treating Provider/Extender: Melburn Hake, HOYT Weeks in Treatment: 1 Debridement Performed for Wound #4 Left,Plantar Metatarsal head first Assessment: Performed By: Physician STONE III, HOYT E., PA-C Debridement: Debridement Pre-procedure Verification/Time Yes - 11:31 Out Taken: Start Time: 11:31 Pain Control: Other : lidocine 4% Level: Skin/Subcutaneous Tissue Total Area Debrided (L x W): 0.8 (cm) x 0.7 (cm) = 0.56 (cm) Tissue and other material Viable, Non-Viable, Callus, Subcutaneous debrided: Instrument: Curette Bleeding: Minimum Hemostasis Achieved: Pressure End Time: 11:35 Procedural Pain: 0 Post Procedural Pain: 0 Response to Treatment: Procedure was tolerated well Post Debridement Measurements of Total Wound Length: (cm) 0.8 Stage: Category/Stage IV Width: (cm) 0.7 Depth: (cm) 0.4 Volume: (cm)  0.176 Character of Wound/Ulcer Post Stable Debridement: Post Procedure Diagnosis Same as Pre-procedure Electronic Signature(s) Signed: 09/18/2017 10:02:55 PM By: Worthy Keeler PA-C Signed: 09/20/2017 10:40:22 AM By: Gretta Cool, BSN, RN, CWS, Kim RN, BSN Entered By: Gretta Cool, BSN, RN, CWS, Kim on 09/17/2017 11:34:00 Angela Patterson ([id number]) -------------------------------------------------------------------------------- HPI Details Patient Name: Angela Patterson. Date of Service: 09/17/2017 11:00 AM Medical Record Number: [id number] Patient Account Number: [id number] Date of Birth/Sex: 22-Jun-1921 (84 y.o. Female) Treating RN: Cornell Barman Primary Care Provider: Einar Pheasant Other Clinician: Referring Provider: Einar Pheasant Treating Provider/Extender: Melburn Hake, HOYT Weeks in Treatment: 1 History of Present Illness HPI Description: this very pleasant 84 year old patient who is extremely active and ambulating all day has had problems with the left foot for over a year. Her daughter who is her caregiver says she drives around and even goes around and does her chores by herself. this is the third recurrence and after the last time we saw in June and discharged her her daughter has got her to pace of diabetic shoes and she's been wearing these regularly. She has been on the shoes for about 6 months now. Her diabetes is under control and her blood sugars run from a anywhere between 110 to 130 and she checks them twice a day. no fever or discharge. During her initial visit, an x-ray of her left foot was done on 09/26/2014. There was no cortical erosion noted there was mild osteopenia in the distal first metatarsal. Radiologist has recommended a MRI of the left foot to exclude osteomyelitis. MRI done on 10/09/2014 reveals that the area where she had a phlegmon is without osteomyelitis. the bone marrow and other areas are compatible with reactive edema rather than osteomyelitis. 08/06/2015 -- x-ray  of the left foot done on 07/30/2015 shows IMPRESSION:Soft tissue wound with a bandage is noted over the plantar aspect of the distal left foot. Diffuse osteopenia degenerative change. No acute bony abnormality 09/16/2015 -- last week she had gone to [facility] and met with Harrington Challenger who had spoken to me over the phone. Instead of a DH walking boot he has given her an offloading boot which will make her more steady and this boot is called DH healing sandal with a custom-made insole to offload this area. Readmission: 09/10/17 on evaluation today patient presents for reevaluation in regard to her left plantar foot ulcer has actually been since 2017 since we have last seen her. With that being said she has seem to do fairly well in the interim since that time. Patient is seen with both her daughter as well as her son present for today's evaluation. Her son tells me that they have ordered diabetic shoes for her which obviously are custom-made and that they were hoping these would be available today to bring in. With that being said she does also have some of the boots left that she had previously for offloading and they brought those with them today as well. With that being said up to this point patient has just been using her tennis shoes in regard to her footwear. She has had drainage from the ulcer and it really does not seem to be improving significantly at this time. She does have a history of diabetes mellitus type II which she is on medication and she is also on a blood thinner. Fortunately she does not seem to have any evidence of discomfort due to the diabetic neuropathy. Patient does have a very high arch which is why she seems to get pressure to this area of the first metatarsal on the left foot leading to the ulcerations that develop. The current ulceration is in the exact location of her prior ulceration. She has not had a recent x-ray. 09/17/17 On evaluation today  patient appears to be doing well in regard to her left plantar foot ulcer. Since last week she has had excellent filling in of the wound and this is definitely much smaller than previously noted. With that being said she is having no pain she has neuropathy and therefore does not feel any pain anyway. There's no evidence of infection which is great news. She does have callous but again this is not nearly as significant as previously noted. Overall I'm definitely pleased with how things stand today. Electronic Signature(s) Signed: 09/18/2017 10:02:55 PM By: Worthy Keeler PA-C Entered By: Worthy Keeler on 09/17/2017 11:54:17 Angela Patterson ([name] ([id number]) -------------------------------------------------------------------------------- Physical Exam Details Patient Name: Angela Patterson. Date of Service: 09/17/2017 11:00 AM Medical Record Number: [id number] Patient Account Number: [id number] Date of Birth/Sex: 23-Mar-1921 (84 y.o. Female) Treating RN: Cornell Barman Primary Care Provider: Einar Pheasant Other Clinician: Referring Provider: Einar Pheasant Treating Provider/Extender: Melburn Hake, HOYT Weeks in Treatment: 1 Constitutional Well-nourished and well-hydrated in no acute distress. Respiratory normal breathing without difficulty. Psychiatric this patient is able to make decisions and demonstrates good insight into disease process. Alert and Oriented x 3. pleasant and cooperative. Notes Today I did have to sharply debride away some of the callous surrounding the wound which she tolerated with no problems. There's also some Slough noted in the base of the wound bed which required sharp debridement and again patient tolerated this without complication. Post debridement the wound bed appear to be doing much better than last week. Electronic Signature(s) Signed: 09/18/2017 10:02:55 PM By: Worthy Keeler PA-C Entered By: Worthy Keeler on 09/17/2017 11:55:32 Angela Patterson ([id number]) -------------------------------------------------------------------------------- Physician Orders Details Patient Name: Angela Patterson.  Date of Service: 09/17/2017 11:00 AM Medical Record Number: [id number] Patient Account Number: [id number] Date of Birth/Sex: Mar 24, 1921 (84 y.o. Female) Treating RN: Cornell Barman Primary Care Provider: Einar Pheasant Other Clinician: Referring Provider: Einar Pheasant Treating Provider/Extender: Melburn Hake, HOYT Weeks in Treatment: 1 Verbal / Phone Orders: No Diagnosis Coding ICD-10 Coding Code Description E11.621 Type 2 diabetes mellitus with 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 with fat layer exposed E11.40 Type 2 diabetes mellitus with diabetic neuropathy, unspecified L84 Corns and callosities I10 Essential (primary) hypertension Wound Cleansing Wound #4 Left,Plantar Metatarsal head first o Clean wound with Normal Saline. o May Shower, gently pat wound dry prior to applying new dressing. Anesthetic (add to Medication List) Wound #4 Left,Plantar Metatarsal head first o Topical Lidocaine 4% cream applied to wound bed prior to debridement (In Clinic Only). Primary Wound Dressing Wound #4 Left,Plantar Metatarsal head first o Silvercel Non-Adherent Secondary Dressing Wound #4 Left,Plantar Metatarsal head first o Dry Gauze o Boardered Foam Dressing Dressing Change Frequency Wound #4 Left,Plantar Metatarsal head first o Change dressing every other day. Follow-up Appointments Wound #4 Left,Plantar Metatarsal head first o Return Appointment in 1 week. Additional Orders / Instructions Wound #4 Left,Plantar Metatarsal head first o Increase protein intake. o Activity as tolerated Patient Medications EMRYS, MCEACHRON ([id number]) Allergies: Iodinated Contrast Media - IV Dye Notifications Medication Indication Start End lidocaine DOSE topical 4 % cream - cream  topical Electronic Signature(s) Signed: 09/18/2017 10:02:55 PM By: Worthy Keeler PA-C Signed: 09/20/2017 10:40:22 AM By: Gretta Cool, BSN, RN, CWS, Kim RN, BSN Entered By: Gretta Cool, BSN, RN, CWS, Kim on 09/17/2017 11:36:40 EYLIN, PONTARELLI ([id number]) -------------------------------------------------------------------------------- Prescription 09/17/2017 Patient Name: Angela Patterson. Provider: Worthy Keeler PA-C Date of Birth: 10-03-1920 NPI#: [id number] Sex: F DEA#: [id number] Phone #: [phone] License #: Patient Address: [facility] [place], [place] [address] [address] [place], [place] [address] [phone] Allergies Iodinated Contrast Media - IV Dye Medication Medication: Route: Strength: Form: lidocaine topical 4% cream Class: TOPICAL LOCAL ANESTHETICS Dose: Frequency / Time: Indication: cream topical Number of Refills: Number of Units: 0 Generic Substitution: Start Date: End Date: Administered at [place]: Yes Time Administered: Time Discontinued: Note to Pharmacy: Signature(s): Date(s): Electronic Signature(s) Signed: 09/18/2017 10:02:55 PM By: Worthy Keeler PA-C Signed: 09/20/2017 10:40:22 AM By: Gretta Cool, BSN, RN, CWS, Kim RN, BSN Entered By: Gretta Cool, BSN, RN, CWS, Kim on 09/17/2017 11:36:40 Angela Patterson ([name] ([id number]) --------------------------------------------------------------------------------  Problem List Details Patient Name: SENETRA, DILLIN. Date of Service: 09/17/2017 11:00 AM Medical Record Number: [id number] Patient Account Number: [id number] Date of Birth/Sex: [birth year] (84 y.o. Female) Treating RN: Cornell Barman Primary Care Provider: Einar Pheasant Other Clinician: Referring Provider: Einar Pheasant Treating Provider/Extender: Melburn Hake, HOYT Weeks in Treatment: 1 Active Problems ICD-10 Encounter Code  Description Active Date Diagnosis E11.621 Type 2 diabetes mellitus with foot ulcer 09/10/2017 Yes L97.522 Non-pressure chronic ulcer of other part of left foot with fat layer 09/10/2017 Yes exposed E11.40 Type 2 diabetes mellitus with diabetic neuropathy, unspecified 09/10/2017 Yes L84 Corns and callosities 09/10/2017 Yes I10 Essential (primary) hypertension 09/10/2017 Yes Inactive Problems Resolved Problems Electronic Signature(s) Signed: 09/18/2017 10:02:55 PM By: Worthy Keeler PA-C Entered By: Worthy Keeler on 09/17/2017 10:37:17 Angela Patterson ([id number]) -------------------------------------------------------------------------------- Progress Note Details Patient Name: Angela Patterson. Date of Service: 09/17/2017 11:00 AM Medical Record Number: [id number] Patient Account  Number: [id number] Date of Birth/Sex: February 11, 1921 (84 y.o. Female) Treating RN: Cornell Barman Primary Care Provider: Einar Pheasant Other Clinician: Referring Provider: Einar Pheasant Treating Provider/Extender: Melburn Hake, HOYT Weeks in Treatment: 1 Subjective Chief Complaint Information obtained from Patient Left plantar foot ulcer History of Present Illness (HPI) this very pleasant 84 year old patient who is extremely active and ambulating all day has had problems with the left foot for over a year. Her daughter who is her caregiver says she drives around and even goes around and does her chores by herself. this is the third recurrence and after the last time we saw in June and discharged her her daughter has got her to pace of diabetic shoes and she's been wearing these regularly. She has been on the shoes for about 6 months now. Her diabetes is under control and her blood sugars run from a anywhere between 110 to 130 and she checks them twice a day. no fever or discharge. During her initial visit, an x-ray of her left foot was done on 09/26/2014. There was no cortical erosion noted there was mild osteopenia in  the distal first metatarsal. Radiologist has recommended a MRI of the left foot to exclude osteomyelitis. MRI done on 10/09/2014 reveals that the area where she had a phlegmon is without osteomyelitis. the bone marrow and other areas are compatible with reactive edema rather than osteomyelitis. 08/06/2015 -- x-ray of the left foot done on 07/30/2015 shows IMPRESSION:Soft tissue wound with a bandage is noted over the plantar aspect of the distal left foot. Diffuse osteopenia degenerative change. No acute bony abnormality 09/16/2015 -- last week she had gone to the [facility] and met with Harrington Challenger who had spoken to me over the phone. Instead of a DH walking boot he has given her an offloading boot which will make her more steady and this boot is called DH healing sandal with a custom-made insole to offload this area. Readmission: 09/10/17 on evaluation today patient presents for reevaluation in regard to her left plantar foot ulcer has actually been since 2017 since we have last seen her. With that being said she has seem to do fairly well in the interim since that time. Patient is seen with both her daughter as well as her son present for today's evaluation. Her son tells me that they have ordered diabetic shoes for her which obviously are custom-made and that they were hoping these would be available today to bring in. With that being said she does also have some of the boots left that she had previously for offloading and they brought those with them today as well. With that being said up to this point patient has just been using her tennis shoes in regard to her footwear. She has had drainage from the ulcer and it really does not seem to be improving significantly at this time. She does have a history of diabetes mellitus type II which she is on medication and she is also on a blood thinner. Fortunately she does not seem to have any evidence of discomfort due to the  diabetic neuropathy. Patient does have a very high arch which is why she seems to get pressure to this area of the first metatarsal on the left foot leading to the ulcerations that develop. The current ulceration is in the exact location of her prior ulceration. She has not had a recent x-ray. 09/17/17 On evaluation today patient appears to be doing well in regard to her left plantar  foot ulcer. Since last week she has had excellent filling in of the wound and this is definitely much smaller than previously noted. With that being said she is having no pain she has neuropathy and therefore does not feel any pain anyway. There's no evidence of infection which is great news. She does have callous but again this is not nearly as significant as previously noted. Overall I'm definitely pleased with how things stand today. MAILIN, COGLIANESE ([id number]) Patient History Information obtained from Patient. Family History Cancer - Father, Diabetes - Siblings, Stroke - Mother, No family history of Heart Disease, Hereditary Spherocytosis, Hypertension, Kidney Disease, Lung Disease, Seizures, Thyroid Problems, Tuberculosis. Social History Never smoker, Marital Status - Widowed, Alcohol Use - Never, Drug Use - No History, Caffeine Use - Daily. Medical And Surgical History Notes Cardiovascular TIA Neurologic TIA Review of Systems (ROS) Constitutional Symptoms (General Health) Denies complaints or symptoms of Fever, Chills. Respiratory The patient has no complaints or symptoms. Cardiovascular The patient has no complaints or symptoms. Psychiatric The patient has no complaints or symptoms. Objective Constitutional Well-nourished and well-hydrated in no acute distress. Vitals Time Taken: 11:08 AM, Height: 65 in, Weight: 107 lbs, BMI: 17.8, Temperature: 97.6 F, Pulse: 69 bpm, Respiratory Rate: 16 breaths/min, Blood Pressure: 143/54 mmHg. Respiratory normal breathing without  difficulty. Psychiatric this patient is able to make decisions and demonstrates good insight into disease process. Alert and Oriented x 3. pleasant and cooperative. General Notes: Today I did have to sharply debride away some of the callous surrounding the wound which she tolerated with no problems. There's also some Slough noted in the base of the wound bed which required sharp debridement and again patient tolerated this without complication. Post debridement the wound bed appear to be doing much better than last week. Integumentary (Hair, Skin) MARIS, ABASCAL. ([id number]) Wound #4 status is Open. Original cause of wound was Pressure Injury. The wound is located on the Left,Plantar Metatarsal head first. The wound measures 0.8cm length x 0.7cm width x 0.3cm depth; 0.44cm^2 area and 0.132cm^3 volume. There is Fat Layer (Subcutaneous Tissue) Exposed and fascia exposed. There is no tunneling or undermining noted. There is a large amount of serosanguineous drainage noted. The wound margin is indistinct and nonvisible. There is medium (34-66%) red granulation within the wound bed. There is a medium (34-66%) amount of necrotic tissue within the wound bed including Eschar and Adherent Slough. The periwound skin appearance exhibited: Callus, Dry/Scaly, Maceration. The periwound skin appearance did not exhibit: Crepitus, Excoriation, Induration, Rash, Scarring, Atrophie Blanche, Cyanosis, Ecchymosis, Hemosiderin Staining, Mottled, Pallor, Rubor, Erythema. Periwound temperature was noted as No Abnormality. The periwound has tenderness on palpation. Assessment Active Problems ICD-10 E11.621 - Type 2 diabetes mellitus with foot ulcer L97.522 - Non-pressure chronic ulcer of other part of left foot with fat layer exposed E11.40 - Type 2 diabetes mellitus with diabetic neuropathy, unspecified L84 - Corns and callosities I10 - Essential (primary) hypertension Procedures Wound #4 Pre-procedure diagnosis  of Wound #4 is a Pressure Ulcer located on the Left,Plantar Metatarsal head first . There was a Skin/Subcutaneous Tissue Debridement ([id number]) debridement with total area of 0.56 sq cm performed by STONE III, HOYT E., PA-C. with the following instrument(s): Curette to remove Viable and Non-Viable tissue/material including Callus and Subcutaneous after achieving pain control using Other (lidocine 4%). A time out was conducted at 11:31, prior to the start of the procedure. A Minimum amount of bleeding was controlled with Pressure. The procedure was tolerated well with  a pain level of 0 throughout and a pain level of 0 following the procedure. Post Debridement Measurements: 0.8cm length x 0.7cm width x 0.4cm depth; 0.176cm^3 volume. Post debridement Stage noted as Category/Stage IV. Character of Wound/Ulcer Post Debridement is stable. Post procedure Diagnosis Wound #4: Same as Pre-Procedure Plan Wound Cleansing: Wound #4 Left,Plantar Metatarsal head first: Clean wound with Normal Saline. May Shower, gently pat wound dry prior to applying new dressing. Anesthetic (add to Medication List): Wound #4 Left,Plantar Metatarsal head first: Topical Lidocaine 4% cream applied to wound bed prior to debridement (In Clinic Only). Primary Wound Dressing: Wound #4 Left,Plantar Metatarsal head first: DANYETTA, GILLHAM ([id number]) Silvercel Non-Adherent Secondary Dressing: Wound #4 Left,Plantar Metatarsal head first: Dry Gauze Boardered Foam Dressing Dressing Change Frequency: Wound #4 Left,Plantar Metatarsal head first: Change dressing every other day. Follow-up Appointments: Wound #4 Left,Plantar Metatarsal head first: Return Appointment in 1 week. Additional Orders / Instructions: Wound #4 Left,Plantar Metatarsal head first: Increase protein intake. Activity as tolerated The following medication(s) was prescribed: lidocaine topical 4 % cream cream topical was prescribed at facility I am going to  suggest that we continue with the Current wound care measures for the next week. She and her daughter are in agreement with plan. Hopefully will get to the point following next week that we will be able to do every other week follow-ups as they really would prefer that. Otherwise we are gonna see were things stand at that point. Please see above for specific wound care orders. We will see patient for re-evaluation in 1 week(s) here in the clinic. If anything worsens or changes patient will contact our office for additional recommendations. Electronic Signature(s) Signed: 09/18/2017 10:02:55 PM By: Worthy Keeler PA-C Entered By: Worthy Keeler on 09/17/2017 11:56:06 Angela Patterson ([id number]) -------------------------------------------------------------------------------- ROS/PFSH Details Patient Name: Angela Patterson. Date of Service: 09/17/2017 11:00 AM Medical Record Number: [id number] Patient Account Number: [id number] Date of Birth/Sex: 1920-12-11 (84 y.o. Female) Treating RN: Cornell Barman Primary Care Provider: Einar Pheasant Other Clinician: Referring Provider: Einar Pheasant Treating Provider/Extender: Melburn Hake, HOYT Weeks in Treatment: 1 Information Obtained From Patient Wound History Do you currently have one or more open woundso Yes How many open wounds do you currently haveo 1 Approximately how long have you had your woundso one month open How have you been treating your wound(s) until nowo wound cleanser Has your wound(s) ever healed and then re-openedo Yes Have you had any lab work done in the past montho No Have you tested positive for an antibiotic resistant organism (MRSA, VRE)o No Have you tested positive for osteomyelitis (bone infection)o No Have you had any tests for circulation on your legso No Constitutional Symptoms (General Health) Complaints and Symptoms: Negative for: Fever; Chills Eyes Medical History: Positive for: Cataracts - removed "a few years ago"  (B ); Glaucoma Ear/Nose/Mouth/Throat Medical History: Negative for: Chronic sinus problems/congestion; Middle ear problems Hematologic/Lymphatic Medical History: Negative for: Anemia; Hemophilia; Human Immunodeficiency Virus; Lymphedema; Sickle Cell Disease Respiratory Complaints and Symptoms: No Complaints or Symptoms Medical History: Negative for: Aspiration; Asthma; Chronic Obstructive Pulmonary Disease (COPD); Pneumothorax; Sleep Apnea; Tuberculosis Cardiovascular Complaints and Symptoms: No Complaints or Symptoms Medical History: Positive for: Hypertension Past Medical History NotesMarland Kitchen IMUNIQUE, SAMAD ([id number]) TIA Gastrointestinal Medical History: Negative for: Cirrhosis ; Crohnos; Hepatitis A; Hepatitis B; Hepatitis C Endocrine Medical History: Positive for: Type II Diabetes Time with diabetes: 2 years Treated with: Oral agents, Diet Blood sugar tested every day: Yes Tested : twice  Blood sugar testing results: Bedtime: 115 Genitourinary Medical History: Negative for: End Stage Renal Disease Immunological Medical History: Negative for: Lupus Erythematosus; Raynaudos; Scleroderma Integumentary (Skin) Medical History: Positive for: History of pressure wounds Negative for: History of Burn Musculoskeletal Medical History: Negative for: Gout; Rheumatoid Arthritis; Osteoarthritis; Osteomyelitis Neurologic Medical History: Positive for: Neuropathy Past Medical History Notes: TIA Oncologic Medical History: Negative for: Received Chemotherapy; Received Radiation Psychiatric Complaints and Symptoms: No Complaints or Symptoms HBO Extended History Items Eyes: Eyes: Cataracts Glaucoma VIONA, HOSKING ([id number]) Immunizations Pneumococcal Vaccine: Received Pneumococcal Vaccination: Yes Implantable Devices Family and Social History Cancer: Yes - Father; Diabetes: Yes - Siblings; Heart Disease: No; Hereditary Spherocytosis: No; Hypertension: No;  Kidney Disease: No; Lung Disease: No; Seizures: No; Stroke: Yes - Mother; Thyroid Problems: No; Tuberculosis: No; Never smoker; Marital Status - Widowed; Alcohol Use: Never; Drug Use: No History; Caffeine Use: Daily; Financial Concerns: No; Food, Clothing or Shelter Needs: No; Support System Lacking: No; Transportation Concerns: No; Advanced Directives: Yes (Not Provided); Patient does not want information on Advanced Directives; Do not resuscitate: No; Living Will: Yes (Copy provided); Medical Power of Attorney: Yes - Olen Cordial- daughter (Copy provided) Physician Affirmation I have reviewed and agree with the above information. Electronic Signature(s) Signed: 09/18/2017 10:02:55 PM By: Worthy Keeler PA-C Signed: 09/20/2017 10:40:22 AM By: Gretta Cool, BSN, RN, CWS, Kim RN, BSN Entered By: Worthy Keeler on 09/17/2017 11:54:42 Angela Patterson ([id number]) -------------------------------------------------------------------------------- SuperBill Details Patient Name: Angela Patterson. Date of Service: 09/17/2017 Medical Record Number: [id number] Patient Account Number: [id number] Date of Birth/Sex: [birth year] (84 y.o. Female) Treating RN: Cornell Barman Primary Care Provider: Einar Pheasant Other Clinician: Referring Provider: Einar Pheasant Treating Provider/Extender: Melburn Hake, HOYT Weeks in Treatment: 1 Diagnosis Coding ICD-10 Codes Code Description E11.621 Type 2 diabetes mellitus with 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 with fat layer exposed E11.40 Type 2 diabetes mellitus with diabetic neuropathy, unspecified L84 Corns and callosities I10 Essential (primary) hypertension Facility Procedures CPT4 Code: 58832549 Description: 82641 - DEB SUBQ TISSUE 20 SQ CM/< ICD-10 Diagnosis Description L97.522 Non-pressure chronic ulcer of other part of left foot with fat Modifier: layer exposed Quantity: 1 Physician Procedures CPT4 Code: 5830940 Description:  11042 - WC PHYS SUBQ TISS 20 SQ CM ICD-10 Diagnosis Description L97.522 Non-pressure chronic ulcer of other part of left foot with fat Modifier: layer exposed Quantity: 1 Electronic Signature(s) Signed: 09/18/2017 10:02:55 PM By: Worthy Keeler PA-C Entered By: Worthy Keeler on 09/17/2017 11:56:17

## 2017-09-20 NOTE — Progress Notes (Signed)
CHERESE, Patterson ([id number]) Visit Report for 09/17/2017 Arrival Information Details Patient Name: Angela Patterson, Angela Patterson. Date of Service: 09/17/2017 11:00 AM Medical Record Number: [id number] Patient Account Number: [id number] Date of Birth/Sex: Jun 20, 1921 (84 y.o. Female) Treating RN: Roger Shelter Primary Care Columbia Pandey: Einar Pheasant Other Clinician: Referring Timiya Howells: Einar Pheasant Treating Amos Micheals/Extender: Melburn Hake, HOYT Weeks in Treatment: 1 Visit Information History Since Last Visit All ordered tests and consults were completed: No Patient Arrived: Ambulatory Added or deleted any medications: No Arrival Time: 11:07 Any new allergies or adverse reactions: No Accompanied By: daughter Had a fall or experienced change in No Transfer Assistance: None activities of daily living that may affect Patient Identification Verified: Yes risk of falls: Secondary Verification Process Completed: Yes Signs or symptoms of abuse/neglect since last visito No Patient Has Alerts: Yes Hospitalized since last visit: No Patient Alerts: DMII Pain Present Now: No Electronic Signature(s) Signed: 09/17/2017 5:56:09 PM By: Roger Shelter Entered By: Roger Shelter on 09/17/2017 11:07:40 Angela Patterson ([id number]) -------------------------------------------------------------------------------- Encounter Discharge Information Details Patient Name: Angela Patterson. Date of Service: 09/17/2017 11:00 AM Medical Record Number: [id number] Patient Account Number: [id number] Date of Birth/Sex: February 15, 1921 (84 y.o. Female) Treating RN: Cornell Barman Primary Care Macai Sisneros: Einar Pheasant Other Clinician: Referring Jordani Nunn: Einar Pheasant Treating Naveya Ellerman/Extender: Melburn Hake, HOYT Weeks in Treatment: 1 Encounter Discharge Information Items Discharge Pain Level: 0 Discharge Condition: Stable Ambulatory Status: Ambulatory Discharge Destination: Home Transportation: Private Auto Accompanied By:  daughter Schedule Follow-up Appointment: Yes Medication Reconciliation completed and Yes provided to Patient/Care Terra Aveni: Provided on Clinical Summary of Care: 09/17/2017 Form Type Recipient Paper Patient EG Electronic Signature(s) Signed: 09/17/2017 12:52:41 PM By: Gretta Cool, BSN, RN, CWS, Kim RN, BSN Entered By: Gretta Cool, BSN, RN, CWS, Kim on 09/17/2017 12:52:41 Angela Patterson ([id number]) -------------------------------------------------------------------------------- Lower Extremity Assessment Details Patient Name: Angela Patterson. Date of Service: 09/17/2017 11:00 AM Medical Record Number: [id number] Patient Account Number: [id number] Date of Birth/Sex: 1920-08-31 (84 y.o. Female) Treating RN: Roger Shelter Primary Care Surie Suchocki: Einar Pheasant Other Clinician: Referring Makeya Hilgert: Einar Pheasant Treating Destry Dauber/Extender: Melburn Hake, HOYT Weeks in Treatment: 1 Edema Assessment Assessed: [Left: No] [Right: No] Edema: [Left: N] [Right: o] Vascular Assessment Claudication: Claudication Assessment [Left:None] Pulses: Dorsalis Pedis Palpable: [Left:Yes] Posterior Tibial Extremity colors, hair growth, and conditions: Extremity Color: [Left:Normal] Hair Growth on Extremity: [Left:No] Temperature of Extremity: [Left:Warm] Capillary Refill: [Left:< 3 seconds] Toe Nail Assessment Left: Right: Thick: Yes Discolored: Yes Deformed: No Improper Length and Hygiene: Yes Electronic Signature(s) Signed: 09/17/2017 5:56:09 PM By: Roger Shelter Entered By: Roger Shelter on 09/17/2017 11:14:59 Angela Patterson ([id number]) -------------------------------------------------------------------------------- Multi Wound Chart Details Patient Name: Angela Patterson. Date of Service: 09/17/2017 11:00 AM Medical Record Number: [id number] Patient Account Number: [id number] Date of Birth/Sex: Feb 19, 1921 (84 y.o. Female) Treating RN: Cornell Barman Primary Care Takela Varden: Einar Pheasant Other  Clinician: Referring Kevin Space: Einar Pheasant Treating Sundee Garland/Extender: Melburn Hake, HOYT Weeks in Treatment: 1 Vital Signs Height(in): 65 Pulse(bpm): 69 Weight(lbs): 107 Blood Pressure(mmHg): 143/54 Body Mass Index(BMI): 18 Temperature(F): 97.6 Respiratory Rate 16 (breaths/min): Photos: [4:No Photos] [N/A:N/A] Wound Location: [4:Left Metatarsal head first - Plantar] [N/A:N/A] Wounding Event: [4:Pressure Injury] [N/A:N/A] Primary Etiology: [4:Pressure Ulcer] [N/A:N/A] Comorbid History: [4:Cataracts, Glaucoma, Hypertension, Type II Diabetes, History of pressure wounds, Neuropathy] [N/A:N/A] Date Acquired: [4:08/20/2017] [N/A:N/A] Weeks of Treatment: [4:1] [N/A:N/A] Wound Status: [4:Open] [N/A:N/A] Measurements L x W x D [4:0.8x0.7x0.3] [N/A:N/A] (cm) Area (cm) : [4:0.44] [N/A:N/A] Volume (cm) : [4:0.132] [N/A:N/A] % Reduction in Area: [4:75.10%] [N/A:N/A] % Reduction in Volume: [  4:85.10%] [N/A:N/A] Classification: [4:Category/Stage IV] [N/A:N/A] Exudate Amount: [4:Large] [N/A:N/A] Exudate Type: [4:Serosanguineous] [N/A:N/A] Exudate Color: [4:red, brown] [N/A:N/A] Wound Margin: [4:Indistinct, nonvisible] [N/A:N/A] Granulation Amount: [4:Medium (34-66%)] [N/A:N/A] Granulation Quality: [4:Red] [N/A:N/A] Necrotic Amount: [4:Medium (34-66%)] [N/A:N/A] Necrotic Tissue: [4:Eschar, Adherent Slough] [N/A:N/A] Exposed Structures: [4:Fascia: Yes Fat Layer (Subcutaneous Tissue) Exposed: Yes Tendon: No Muscle: No Joint: No Bone: No] [N/A:N/A] Epithelialization: [4:None] [N/A:N/A] Periwound Skin Texture: [4:Callus: Yes Excoriation: No] [N/A:N/A] Induration: No Crepitus: No Rash: No Scarring: No Periwound Skin Moisture: Maceration: Yes N/A N/A Dry/Scaly: Yes Periwound Skin Color: Atrophie Blanche: No N/A N/A Cyanosis: No Ecchymosis: No Erythema: No Hemosiderin Staining: No Mottled: No Pallor: No Rubor: No Temperature: No Abnormality N/A N/A Tenderness on Palpation: Yes  N/A N/A Wound Preparation: Ulcer Cleansing: N/A N/A Rinsed/Irrigated with Saline Topical Anesthetic Applied: Other: lidocaine 4% Treatment Notes Electronic Signature(s) Signed: 09/20/2017 10:40:22 AM By: Gretta Cool, BSN, RN, CWS, Kim RN, BSN Entered By: Gretta Cool, BSN, RN, CWS, Kim on 09/17/2017 11:32:02 Angela Patterson ([id number]) -------------------------------------------------------------------------------- Multi-Disciplinary Care Plan Details Patient Name: RONNE, SAVOIA. Date of Service: 09/17/2017 11:00 AM Medical Record Number: [id number] Patient Account Number: [id number] Date of Birth/Sex: 01-01-1921 (84 y.o. Female) Treating RN: Cornell Barman Primary Care Sarika Baldini: Einar Pheasant Other Clinician: Referring Tamberly Pomplun: Einar Pheasant Treating Jyair Kiraly/Extender: Melburn Hake, HOYT Weeks in Treatment: 1 Active Inactive ` Abuse / Safety / Falls / Self Care Management Nursing Diagnoses: Potential for falls Goals: Patient will not experience any injury related to falls Date Initiated: 09/10/2017 Target Resolution Date: 11/27/2017 Goal Status: Active Interventions: Assess fall risk on admission and as needed Notes: ` Orientation to the Wound Care Program Nursing Diagnoses: Knowledge deficit related to the wound healing center program Goals: Patient/caregiver will verbalize understanding of the [facility] Date Initiated: 09/10/2017 Target Resolution Date: 11/27/2017 Goal Status: Active Interventions: Provide education on orientation to the wound center Notes: ` Wound/Skin Impairment Nursing Diagnoses: Impaired tissue integrity Goals: Ulcer/skin breakdown will heal within 14 weeks Date Initiated: 09/10/2017 Target Resolution Date: 11/27/2017 Goal Status: Active Interventions: SHANTINIQUE, PICAZO ([id number]) Assess patient/caregiver ability to obtain necessary supplies Assess patient/caregiver ability to perform ulcer/skin care regimen upon admission and as  needed Assess ulceration(s) every visit Notes: Electronic Signature(s) Signed: 09/20/2017 10:40:22 AM By: Gretta Cool, BSN, RN, CWS, Kim RN, BSN Entered By: Gretta Cool, BSN, RN, CWS, Kim on 09/17/2017 11:31:24 Angela Patterson ([id number]) -------------------------------------------------------------------------------- Pain Assessment Details Patient Name: Angela Patterson. Date of Service: 09/17/2017 11:00 AM Medical Record Number: [id number] Patient Account Number: [id number] Date of Birth/Sex: Jan 14, 1921 (84 y.o. Female) Treating RN: Roger Shelter Primary Care Teriyah Purington: Einar Pheasant Other Clinician: Referring Govani Radloff: Einar Pheasant Treating Kyia Rhude/Extender: Melburn Hake, HOYT Weeks in Treatment: 1 Active Problems Location of Pain Severity and Description of Pain Patient Has Paino No Site Locations Pain Management and Medication Current Pain Management: Electronic Signature(s) Signed: 09/17/2017 5:56:09 PM By: Roger Shelter Entered By: Roger Shelter on 09/17/2017 11:07:47 Angela Patterson ([id number]) -------------------------------------------------------------------------------- Patient/Caregiver Education Details Patient Name: Angela Patterson. Date of Service: 09/17/2017 11:00 AM Medical Record Number: [id number] Patient Account Number: [id number] Date of Birth/Gender: 05/06/1921 (84 y.o. Female) Treating RN: Cornell Barman Primary Care Physician: Einar Pheasant Other Clinician: Referring Physician: Einar Pheasant Treating Physician/Extender: Sharalyn Ink in Treatment: 1 Education Assessment Education Provided To: Patient Education Topics Provided Wound/Skin Impairment: Handouts: Caring for Your Ulcer, Other: continue wound care as prescribed Methods: Explain/Verbal Responses: State content correctly Electronic Signature(s) Signed: 09/20/2017 10:40:22 AM By: Gretta Cool, BSN, RN, CWS, Kim RN, BSN  Entered By: Gretta Cool, BSN, RN, CWS, Kim on 09/17/2017 12:53:03 Angela Patterson  ([id number]) -------------------------------------------------------------------------------- Wound Assessment Details Patient Name: Angela Patterson. Date of Service: 09/17/2017 11:00 AM Medical Record Number: [id number] Patient Account Number: [id number] Date of Birth/Sex: [birth year] (84 y.o. Female) Treating RN: Roger Shelter Primary Care Avanni Turnbaugh: Einar Pheasant Other Clinician: Referring Sharnetta Gielow: Einar Pheasant Treating Eldredge Veldhuizen/Extender: Melburn Hake, HOYT Weeks in Treatment: 1 Wound Status Wound Number: 4 Primary Pressure Ulcer Etiology: Wound Location: Left Metatarsal head first - Plantar Wound Open Wounding Event: Pressure Injury Status: Date Acquired: 08/20/2017 Comorbid Cataracts, Glaucoma, Hypertension, Type II Weeks Of Treatment: 1 History: Diabetes, History of pressure wounds, Clustered Wound: No Neuropathy Wound Measurements Length: (cm) 0.8 Width: (cm) 0.7 Depth: (cm) 0.3 Area: (cm) 0.44 Volume: (cm) 0.132 % Reduction in Area: 75.1% % Reduction in Volume: 85.1% Epithelialization: None Tunneling: No Undermining: No Wound Description Classification: Category/Stage IV Wound Margin: Indistinct, nonvisible Exudate Amount: Large Exudate Type: Serosanguineous Exudate Color: red, brown Foul Odor After Cleansing: No Slough/Fibrino Yes Wound Bed Granulation Amount: Medium (34-66%) Exposed Structure Granulation Quality: Red Fascia Exposed: Yes Necrotic Amount: Medium (34-66%) Fat Layer (Subcutaneous Tissue) Exposed: Yes Necrotic Quality: Eschar, Adherent Slough Tendon Exposed: No Muscle Exposed: No Joint Exposed: No Bone Exposed: No Periwound Skin Texture Texture Color No Abnormalities Noted: No No Abnormalities Noted: No Callus: Yes Atrophie Blanche: No Crepitus: No Cyanosis: No Excoriation: No Ecchymosis: No Induration: No Erythema: No Rash: No Hemosiderin Staining: No Scarring: No Mottled: No Pallor: No Moisture Rubor: No No  Abnormalities Noted: No Dry / Scaly: Yes Temperature / Pain Maceration: Yes Temperature: No Abnormality DENINE, BROTZ. ([id number]) Tenderness on Palpation: Yes Wound Preparation Ulcer Cleansing: Rinsed/Irrigated with Saline Topical Anesthetic Applied: Other: lidocaine 4%, Treatment Notes Wound #4 (Left, Plantar Metatarsal head first) 1. Cleansed with: Clean wound with Normal Saline 2. Anesthetic Topical Lidocaine 4% cream to wound bed prior to debridement 4. Dressing Applied: Other dressing (specify in notes) 5. Secondary Dressing Applied Bordered Foam Dressing Notes silvercel Electronic Signature(s) Signed: 09/17/2017 5:56:09 PM By: Roger Shelter Entered By: Roger Shelter on 09/17/2017 11:13:38 Angela Patterson ([id number]) -------------------------------------------------------------------------------- Vitals Details Patient Name: Angela Patterson. Date of Service: 09/17/2017 11:00 AM Medical Record Number: [id number] Patient Account Number: [id number] Date of Birth/Sex: 05/19/1921 (84 y.o. Female) Treating RN: Roger Shelter Primary Care Kayceon Oki: Einar Pheasant Other Clinician: Referring Nyasia Baxley: Einar Pheasant Treating Sussan Meter/Extender: Melburn Hake, HOYT Weeks in Treatment: 1 Vital Signs Time Taken: 11:08 Temperature (F): 97.6 Height (in): 65 Pulse (bpm): 69 Weight (lbs): 107 Respiratory Rate (breaths/min): 16 Body Mass Index (BMI): 17.8 Blood Pressure (mmHg): 143/54 Reference Range: 80 - 120 mg / dl Electronic Signature(s) Signed: 09/17/2017 5:56:09 PM By: Roger Shelter Entered By: Roger Shelter on 09/17/2017 11:08:36

## 2017-09-21 ENCOUNTER — Other Ambulatory Visit: Payer: Self-pay | Admitting: Internal Medicine

## 2017-09-24 ENCOUNTER — Encounter: Payer: Medicare Other | Admitting: Physician Assistant

## 2017-09-24 DIAGNOSIS — L89894 Pressure ulcer of other site, stage 4: Secondary | ICD-10-CM | POA: Diagnosis not present

## 2017-09-24 DIAGNOSIS — L97522 Non-pressure chronic ulcer of other part of left foot with fat layer exposed: Secondary | ICD-10-CM | POA: Diagnosis not present

## 2017-09-24 DIAGNOSIS — E1140 Type 2 diabetes mellitus with diabetic neuropathy, unspecified: Secondary | ICD-10-CM | POA: Diagnosis not present

## 2017-09-24 DIAGNOSIS — E11621 Type 2 diabetes mellitus with foot ulcer: Secondary | ICD-10-CM | POA: Diagnosis not present

## 2017-09-24 DIAGNOSIS — Z79899 Other long term (current) drug therapy: Secondary | ICD-10-CM | POA: Diagnosis not present

## 2017-09-24 DIAGNOSIS — Z7984 Long term (current) use of oral hypoglycemic drugs: Secondary | ICD-10-CM | POA: Diagnosis not present

## 2017-09-24 DIAGNOSIS — I10 Essential (primary) hypertension: Secondary | ICD-10-CM | POA: Diagnosis not present

## 2017-09-24 DIAGNOSIS — M8580 Other specified disorders of bone density and structure, unspecified site: Secondary | ICD-10-CM | POA: Diagnosis not present

## 2017-09-24 DIAGNOSIS — Z8673 Personal history of transient ischemic attack (TIA), and cerebral infarction without residual deficits: Secondary | ICD-10-CM | POA: Diagnosis not present

## 2017-09-24 DIAGNOSIS — L84 Corns and callosities: Secondary | ICD-10-CM | POA: Diagnosis not present

## 2017-09-28 NOTE — Progress Notes (Signed)
Angela Patterson, Angela Patterson ([id number]) Visit Report for 09/24/2017 Chief Complaint Document Details Patient Name: Angela Patterson, Angela Patterson. Date of Service: 09/24/2017 11:15 AM Medical Record Number: [id number] Patient Account Number: [id number] Date of Birth/Sex: [birth year] (84 y.o. Female) Treating RN: Montey Hora Primary Care Provider: Einar Pheasant Other Clinician: Referring Provider: Einar Pheasant Treating Provider/Extender: Melburn Hake, Leland Staszewski Weeks in Treatment: 2 Information Obtained from: Patient Chief Complaint Left plantar foot ulcer Electronic Signature(s) Signed: 09/24/2017 6:07:32 PM By: Worthy Keeler PA-C Entered By: Worthy Keeler on 09/24/2017 11:35:10 Angela Patterson ([id number]) -------------------------------------------------------------------------------- Debridement Details Patient Name: Angela Patterson. Date of Service: 09/24/2017 11:15 AM Medical Record Number: [id number] Patient Account Number: [id number] Date of Birth/Sex: 10-27-1920 (84 y.o. Female) Treating RN: Montey Hora Primary Care Provider: Einar Pheasant Other Clinician: Referring Provider: Einar Pheasant Treating Provider/Extender: Melburn Hake, Miguelangel Korn Weeks in Treatment: 2 Debridement Performed for Wound #4 Left,Plantar Metatarsal head first Assessment: Performed By: Physician STONE III, Niveah Boerner E., PA-C Debridement: Debridement Pre-procedure Verification/Time Yes - 11:25 Out Taken: Start Time: 11:25 Pain Control: Lidocaine 4% Topical Solution Level: Skin/Subcutaneous Tissue Total Area Debrided (L x W): 0.4 (cm) x 0.4 (cm) = 0.16 (cm) Tissue and other material Viable, Non-Viable, Callus, Fibrin/Slough, Subcutaneous debrided: Instrument: Curette Bleeding: Minimum Hemostasis Achieved: Pressure End Time: 11:28 Procedural Pain: 0 Post Procedural Pain: 0 Response to Treatment: Procedure was tolerated well Post Debridement Measurements of Total Wound Length: (cm) 0.4 Stage: Category/Stage IV Width: (cm)  0.4 Depth: (cm) 0.3 Volume: (cm) 0.038 Character of Wound/Ulcer Post Improved Debridement: Post Procedure Diagnosis Same as Pre-procedure Electronic Signature(s) Signed: 09/24/2017 4:45:57 PM By: Montey Hora Signed: 09/24/2017 6:07:32 PM By: Worthy Keeler PA-C Entered By: Montey Hora on 09/24/2017 11:28:03 Angela Patterson ([id number]) -------------------------------------------------------------------------------- HPI Details Patient Name: Angela Patterson. Date of Service: 09/24/2017 11:15 AM Medical Record Number: [id number] Patient Account Number: [id number] Date of Birth/Sex: April 13, 1921 (84 y.o. Female) Treating RN: Montey Hora Primary Care Provider: Einar Pheasant Other Clinician: Referring Provider: Einar Pheasant Treating Provider/Extender: Melburn Hake, Rahsaan Weakland Weeks in Treatment: 2 History of Present Illness HPI Description: this very pleasant 84 year old patient who is extremely active and ambulating all day has had problems with the left foot for over a year. Her daughter who is her caregiver says she drives around and even goes around and does her chores by herself. this is the third recurrence and after the last time we saw in June and discharged her her daughter has got her to pace of diabetic shoes and she's been wearing these regularly. She has been on the shoes for about 6 months now. Her diabetes is under control and her blood sugars run from a anywhere between 110 to 130 and she checks them twice a day. no fever or discharge. During her initial visit, an x-ray of her left foot was done on 09/26/2014. There was no cortical erosion noted there was mild osteopenia in the distal first metatarsal. Radiologist has recommended a MRI of the left foot to exclude osteomyelitis. MRI done on 10/09/2014 reveals that the area where she had a phlegmon is without osteomyelitis. the bone marrow and other areas are compatible with reactive edema rather than  osteomyelitis. 08/06/2015 -- x-ray of the left foot done on 07/30/2015 shows IMPRESSION:Soft tissue wound with a bandage is noted over the plantar aspect of the distal left foot. Diffuse osteopenia degenerative change. No acute bony abnormality 09/16/2015 -- last week she had gone to [facility] and met with Harrington Challenger who  had spoken to me over the phone. Instead of a DH walking boot he has given her an offloading boot which will make her more steady and this boot is called DH healing sandal with a custom-made insole to offload this area. Readmission: 09/10/17 on evaluation today patient presents for reevaluation in regard to her left plantar foot ulcer has actually been since 2017 since we have last seen her. With that being said she has seem to do fairly well in the interim since that time. Patient is seen with both her daughter as well as her son present for today's evaluation. Her son tells me that they have ordered diabetic shoes for her which obviously are custom-made and that they were hoping these would be available today to bring in. With that being said she does also have some of the boots left that she had previously for offloading and they brought those with them today as well. With that being said up to this point patient has just been using her tennis shoes in regard to her footwear. She has had drainage from the ulcer and it really does not seem to be improving significantly at this time. She does have a history of diabetes mellitus type II which she is on medication and she is also on a blood thinner. Fortunately she does not seem to have any evidence of discomfort due to the diabetic neuropathy. Patient does have a very high arch which is why she seems to get pressure to this area of the first metatarsal on the left foot leading to the ulcerations that develop. The current ulceration is in the exact location of her prior ulceration. She has not had a recent  x-ray. 09/17/17 On evaluation today patient appears to be doing well in regard to her left plantar foot ulcer. Since last week she has had excellent filling in of the wound and this is definitely much smaller than previously noted. With that being said she is having no pain she has neuropathy and therefore does not feel any pain anyway. There's no evidence of infection which is great news. She does have callous but again this is not nearly as significant as previously noted. Overall I'm definitely pleased with how things stand today. 09/24/17 on evaluation today patient's left plantar foot ulcer appears to show signs of good improvement compared even to last week's evaluation. She is slowly healing and again from the first time I saw this this definitely appears to be doing much better. She's not have any discomfort which is great news unfortunately most of this is actually due to the fact that she has neuropathy. Other than that however she has been tolerating the dressing changes without complication she actually performs the dressing changes herself. Angela Patterson, Angela Patterson ([id number]) Electronic Signature(s) Signed: 09/24/2017 6:07:32 PM By: Worthy Keeler PA-C Entered By: Worthy Keeler on 09/24/2017 14:22:35 Angela Patterson ([id number]) -------------------------------------------------------------------------------- Physical Exam Details Patient Name: Angela Patterson. Date of Service: 09/24/2017 11:15 AM Medical Record Number: [id number] Patient Account Number: [id number] Date of Birth/Sex: 1921-01-21 (84 y.o. Female) Treating RN: Montey Hora Primary Care Provider: Einar Pheasant Other Clinician: Referring Provider: Einar Pheasant Treating Provider/Extender: Melburn Hake, Rapheal Masso Weeks in Treatment: 2 Constitutional Well-nourished and well-hydrated in no acute distress. Respiratory normal breathing without difficulty. Psychiatric this patient is able to make decisions and demonstrates good insight  into disease process. Alert and Oriented x 3. pleasant and cooperative. Notes Patient's wound bed on evaluation did show evidence of slough which  did require sharp debridement today. She tolerated this without complication. Post debridement the wound bed appear to be slightly larger but definitely much cleaner. Overall I'm pleased with how this is appearing and progressing. Electronic Signature(s) Signed: 09/24/2017 6:07:32 PM By: Worthy Keeler PA-C Entered By: Worthy Keeler on 09/24/2017 14:24:02 Angela Patterson ([id number]) -------------------------------------------------------------------------------- Physician Orders Details Patient Name: Angela Patterson. Date of Service: 09/24/2017 11:15 AM Medical Record Number: [id number] Patient Account Number: [id number] Date of Birth/Sex: 27-Oct-1920 (84 y.o. Female) Treating RN: Montey Hora Primary Care Provider: Einar Pheasant Other Clinician: Referring Provider: Einar Pheasant Treating Provider/Extender: Melburn Hake, Bradley Handyside Weeks in Treatment: 2 Verbal / Phone Orders: No Diagnosis Coding Wound Cleansing Wound #4 Left,Plantar Metatarsal head first o Clean wound with Normal Saline. o May Shower, gently pat wound dry prior to applying new dressing. Anesthetic (add to Medication List) Wound #4 Left,Plantar Metatarsal head first o Topical Lidocaine 4% cream applied to wound bed prior to debridement (In Clinic Only). Primary Wound Dressing Wound #4 Left,Plantar Metatarsal head first o Silvercel Non-Adherent Secondary Dressing Wound #4 Left,Plantar Metatarsal head first o Dry Gauze o Boardered Foam Dressing Dressing Change Frequency Wound #4 Left,Plantar Metatarsal head first o Change dressing every other day. Follow-up Appointments Wound #4 Left,Plantar Metatarsal head first o Return Appointment in 1 week. Additional Orders / Instructions Wound #4 Left,Plantar Metatarsal head first o Increase protein intake. o  Activity as tolerated Electronic Signature(s) Signed: 09/24/2017 4:45:57 PM By: Montey Hora Signed: 09/24/2017 6:07:32 PM By: Worthy Keeler PA-C Entered By: Montey Hora on 09/24/2017 11:28:40 Angela Patterson ([id number]) -------------------------------------------------------------------------------- Problem List Details Patient Name: Angela Patterson. Date of Service: 09/24/2017 11:15 AM Medical Record Number: [id number] Patient Account Number: [id number] Date of Birth/Sex: 09/30/1920 (84 y.o. Female) Treating RN: Montey Hora Primary Care Provider: Einar Pheasant Other Clinician: Referring Provider: Einar Pheasant Treating Provider/Extender: Melburn Hake, Moustapha Tooker Weeks in Treatment: 2 Active Problems ICD-10 Encounter Code Description Active Date Diagnosis E11.621 Type 2 diabetes mellitus with foot ulcer 09/10/2017 Yes L97.522 Non-pressure chronic ulcer of other part of left foot with fat layer 09/10/2017 Yes exposed E11.40 Type 2 diabetes mellitus with diabetic neuropathy, unspecified 09/10/2017 Yes L84 Corns and callosities 09/10/2017 Yes I10 Essential (primary) hypertension 09/10/2017 Yes Inactive Problems Resolved Problems Electronic Signature(s) Signed: 09/24/2017 6:07:32 PM By: Worthy Keeler PA-C Entered By: Worthy Keeler on 09/24/2017 11:35:04 Angela Patterson ([id number]) -------------------------------------------------------------------------------- Progress Note Details Patient Name: Angela Patterson. Date of Service: 09/24/2017 11:15 AM Medical Record Number: [id number] Patient Account Number: [id number] Date of Birth/Sex: Aug 31, 1920 (84 y.o. Female) Treating RN: Montey Hora Primary Care Provider: Einar Pheasant Other Clinician: Referring Provider: Einar Pheasant Treating Provider/Extender: Melburn Hake, Raja Liska Weeks in Treatment: 2 Subjective Chief Complaint Information obtained from Patient Left plantar foot ulcer History of Present Illness (HPI) this very pleasant  84 year old patient who is extremely active and ambulating all day has had problems with the left foot for over a year. Her daughter who is her caregiver says she drives around and even goes around and does her chores by herself. this is the third recurrence and after the last time we saw in June and discharged her her daughter has got her to pace of diabetic shoes and she's been wearing these regularly. She has been on the shoes for about 6 months now. Her diabetes is under control and her blood sugars run from a anywhere between 110 to 130 and she checks them twice a day. no fever or discharge.  During her initial visit, an x-ray of her left foot was done on 09/26/2014. There was no cortical erosion noted there was mild osteopenia in the distal first metatarsal. Radiologist has recommended a MRI of the left foot to exclude osteomyelitis. MRI done on 10/09/2014 reveals that the area where she had a phlegmon is without osteomyelitis. the bone marrow and other areas are compatible with reactive edema rather than osteomyelitis. 08/06/2015 -- x-ray of the left foot done on 07/30/2015 shows IMPRESSION:Soft tissue wound with a bandage is noted over the plantar aspect of the distal left foot. Diffuse osteopenia degenerative change. No acute bony abnormality 09/16/2015 -- last week she had gone to [facility] and met with Harrington Challenger who had spoken to me over the phone. Instead of a DH walking boot he has given her an offloading boot which will make her more steady and this boot is called DH healing sandal with a custom-made insole to offload this area. Readmission: 09/10/17 on evaluation today patient presents for reevaluation in regard to her left plantar foot ulcer has actually been since 2017 since we have last seen her. With that being said she has seem to do fairly well in the interim since that time. Patient is seen with both her daughter as well as her son present for  today's evaluation. Her son tells me that they have ordered diabetic shoes for her which obviously are custom-made and that they were hoping these would be available today to bring in. With that being said she does also have some of the boots left that she had previously for offloading and they brought those with them today as well. With that being said up to this point patient has just been using her tennis shoes in regard to her footwear. She has had drainage from the ulcer and it really does not seem to be improving significantly at this time. She does have a history of diabetes mellitus type II which she is on medication and she is also on a blood thinner. Fortunately she does not seem to have any evidence of discomfort due to the diabetic neuropathy. Patient does have a very high arch which is why she seems to get pressure to this area of the first metatarsal on the left foot leading to the ulcerations that develop. The current ulceration is in the exact location of her prior ulceration. She has not had a recent x-ray. 09/17/17 On evaluation today patient appears to be doing well in regard to her left plantar foot ulcer. Since last week she has had excellent filling in of the wound and this is definitely much smaller than previously noted. With that being said she is having no pain she has neuropathy and therefore does not feel any pain anyway. There's no evidence of infection which is great news. She does have callous but again this is not nearly as significant as previously noted. Overall I'm definitely pleased with how things stand today. Angela Patterson, Angela Patterson ([id number]) 09/24/17 on evaluation today patient's left plantar foot ulcer appears to show signs of good improvement compared even to last week's evaluation. She is slowly healing and again from the first time I saw this this definitely appears to be doing much better. She's not have any discomfort which is great news unfortunately most of this  is actually due to the fact that she has neuropathy. Other than that however she has been tolerating the dressing changes without complication she actually performs the dressing changes  herself. Patient History Information obtained from Patient. Family History Cancer - Father, Diabetes - Siblings, Stroke - Mother, No family history of Heart Disease, Hereditary Spherocytosis, Hypertension, Kidney Disease, Lung Disease, Seizures, Thyroid Problems, Tuberculosis. Social History Never smoker, Marital Status - Widowed, Alcohol Use - Never, Drug Use - No History, Caffeine Use - Daily. Medical And Surgical History Notes Cardiovascular TIA Neurologic TIA Review of Systems (ROS) Constitutional Symptoms (General Health) Denies complaints or symptoms of Fever, Chills. Respiratory The patient has no complaints or symptoms. Cardiovascular The patient has no complaints or symptoms. Psychiatric The patient has no complaints or symptoms. Objective Constitutional Well-nourished and well-hydrated in no acute distress. Vitals Time Taken: 11:08 AM, Height: 65 in, Weight: 107 lbs, BMI: 17.8, Temperature: 98.2 F, Pulse: 68 bpm, Respiratory Rate: 18 breaths/min, Blood Pressure: 188/53 mmHg. Respiratory normal breathing without difficulty. Psychiatric this patient is able to make decisions and demonstrates good insight into disease process. Alert and Oriented x 3. pleasant and cooperative. Angela Patterson, Angela Patterson ([id number]) General Notes: Patient's wound bed on evaluation did show evidence of slough which did require sharp debridement today. She tolerated this without complication. Post debridement the wound bed appear to be slightly larger but definitely much cleaner. Overall I'm pleased with how this is appearing and progressing. Integumentary (Hair, Skin) Wound #4 status is Open. Original cause of wound was Pressure Injury. The wound is located on the Left,Plantar Metatarsal head first. The wound  measures 0.4cm length x 0.4cm width x 0.2cm depth; 0.126cm^2 area and 0.025cm^3 volume. There is Fat Layer (Subcutaneous Tissue) Exposed and fascia exposed. There is no tunneling or undermining noted. There is a large amount of serosanguineous drainage noted. The wound margin is indistinct and nonvisible. There is large (67-100%) red granulation within the wound bed. There is a small (1-33%) amount of necrotic tissue within the wound bed including Eschar and Adherent Slough. The periwound skin appearance exhibited: Callus, Dry/Scaly, Maceration. The periwound skin appearance did not exhibit: Crepitus, Excoriation, Induration, Rash, Scarring, Atrophie Blanche, Cyanosis, Ecchymosis, Hemosiderin Staining, Mottled, Pallor, Rubor, Erythema. Periwound temperature was noted as No Abnormality. The periwound has tenderness on palpation. Assessment Active Problems ICD-10 E11.621 - Type 2 diabetes mellitus with foot ulcer L97.522 - Non-pressure chronic ulcer of other part of left foot with fat layer exposed E11.40 - Type 2 diabetes mellitus with diabetic neuropathy, unspecified L84 - Corns and callosities I10 - Essential (primary) hypertension Procedures Wound #4 Pre-procedure diagnosis of Wound #4 is a Pressure Ulcer located on the Left,Plantar Metatarsal head first . There was a Skin/Subcutaneous Tissue Debridement ([id number]) debridement with total area of 0.16 sq cm performed by STONE III, Ras Kollman E., PA-C. with the following instrument(s): Curette to remove Viable and Non-Viable tissue/material including Fibrin/Slough, Callus, and Subcutaneous after achieving pain control using Lidocaine 4% Topical Solution. A time out was conducted at 11:25, prior to the start of the procedure. A Minimum amount of bleeding was controlled with Pressure. The procedure was tolerated well with a pain level of 0 throughout and a pain level of 0 following the procedure. Post Debridement Measurements: 0.4cm length x 0.4cm  width x 0.3cm depth; 0.038cm^3 volume. Post debridement Stage noted as Category/Stage IV. Character of Wound/Ulcer Post Debridement is improved. Post procedure Diagnosis Wound #4: Same as Pre-Procedure Plan Wound Cleansing: Wound #4 Left,Plantar Metatarsal head first: Angela Patterson, Angela Patterson. ([id number]) Clean wound with Normal Saline. May Shower, gently pat wound dry prior to applying new dressing. Anesthetic (add to Medication List): Wound #4 Left,Plantar Metatarsal head first:  Topical Lidocaine 4% cream applied to wound bed prior to debridement (In Clinic Only). Primary Wound Dressing: Wound #4 Left,Plantar Metatarsal head first: Silvercel Non-Adherent Secondary Dressing: Wound #4 Left,Plantar Metatarsal head first: Dry Gauze Boardered Foam Dressing Dressing Change Frequency: Wound #4 Left,Plantar Metatarsal head first: Change dressing every other day. Follow-up Appointments: Wound #4 Left,Plantar Metatarsal head first: Return Appointment in 1 week. Additional Orders / Instructions: Wound #4 Left,Plantar Metatarsal head first: Increase protein intake. Activity as tolerated I am going to recommend that we continue with the Current wound care measures for the next week. We may look to switch this at some point in the future if anything stalls but as long as it continues to show signs of improvement I'm happy with that. Patient likewise is happy with not making any big changes. We will see were things stand in one weeks time. Please see above for specific wound care orders. We will see patient for re-evaluation in 1 week(s) here in the clinic. If anything worsens or changes patient will contact our office for additional recommendations. Electronic Signature(s) Signed: 09/24/2017 6:07:32 PM By: Worthy Keeler PA-C Entered By: Worthy Keeler on 09/24/2017 14:24:34 Angela Patterson ([id number]) -------------------------------------------------------------------------------- ROS/PFSH  Details Patient Name: Angela Patterson. Date of Service: 09/24/2017 11:15 AM Medical Record Number: [id number] Patient Account Number: [id number] Date of Birth/Sex: 1921/05/06 (84 y.o. Female) Treating RN: Montey Hora Primary Care Provider: Einar Pheasant Other Clinician: Referring Provider: Einar Pheasant Treating Provider/Extender: Melburn Hake, Kristelle Cavallaro Weeks in Treatment: 2 Information Obtained From Patient Wound History Do you currently have one or more open woundso Yes How many open wounds do you currently haveo 1 Approximately how long have you had your woundso one month open How have you been treating your wound(s) until nowo wound cleanser Has your wound(s) ever healed and then re-openedo Yes Have you had any lab work done in the past montho No Have you tested positive for an antibiotic resistant organism (MRSA, VRE)o No Have you tested positive for osteomyelitis (bone infection)o No Have you had any tests for circulation on your legso No Constitutional Symptoms (General Health) Complaints and Symptoms: Negative for: Fever; Chills Eyes Medical History: Positive for: Cataracts - removed "a few years ago" (B ); Glaucoma Ear/Nose/Mouth/Throat Medical History: Negative for: Chronic sinus problems/congestion; Middle ear problems Hematologic/Lymphatic Medical History: Negative for: Anemia; Hemophilia; Human Immunodeficiency Virus; Lymphedema; Sickle Cell Disease Respiratory Complaints and Symptoms: No Complaints or Symptoms Medical History: Negative for: Aspiration; Asthma; Chronic Obstructive Pulmonary Disease (COPD); Pneumothorax; Sleep Apnea; Tuberculosis Cardiovascular Complaints and Symptoms: No Complaints or Symptoms Medical History: Positive for: Hypertension Past Medical History NotesMarland Kitchen Angela Patterson, Angela Patterson ([id number]) TIA Gastrointestinal Medical History: Negative for: Cirrhosis ; Crohnos; Hepatitis A; Hepatitis B; Hepatitis C Endocrine Medical History: Positive  for: Type II Diabetes Time with diabetes: 2 years Treated with: Oral agents, Diet Blood sugar tested every day: Yes Tested : twice Blood sugar testing results: Bedtime: 115 Genitourinary Medical History: Negative for: End Stage Renal Disease Immunological Medical History: Negative for: Lupus Erythematosus; Raynaudos; Scleroderma Integumentary (Skin) Medical History: Positive for: History of pressure wounds Negative for: History of Burn Musculoskeletal Medical History: Negative for: Gout; Rheumatoid Arthritis; Osteoarthritis; Osteomyelitis Neurologic Medical History: Positive for: Neuropathy Past Medical History Notes: TIA Oncologic Medical History: Negative for: Received Chemotherapy; Received [facility] Complaints and Symptoms: No Complaints or Symptoms HBO Extended History Items Eyes: Eyes: Cataracts Glaucoma Angela Patterson, Angela Patterson ([id number]) Immunizations Pneumococcal Vaccine: Received Pneumococcal Vaccination: Yes Implantable Devices Family and Social History  Cancer: Yes - Father; Diabetes: Yes - Siblings; Heart Disease: No; Hereditary Spherocytosis: No; Hypertension: No; Kidney Disease: No; Lung Disease: No; Seizures: No; Stroke: Yes - Mother; Thyroid Problems: No; Tuberculosis: No; Never smoker; Marital Status - Widowed; Alcohol Use: Never; Drug Use: No History; Caffeine Use: Daily; Financial Concerns: No; Food, Clothing or Shelter Needs: No; Support System Lacking: No; Transportation Concerns: No; Advanced Directives: Yes (Not Provided); Patient does not want information on Advanced Directives; Do not resuscitate: No; Living Will: Yes (Copy provided); Medical Power of Attorney: Yes - Olen Cordial- daughter (Copy provided) Physician Affirmation I have reviewed and agree with the above information. Electronic Signature(s) Signed: 09/24/2017 4:45:57 PM By: Montey Hora Signed: 09/24/2017 6:07:32 PM By: Worthy Keeler PA-C Entered By: Worthy Keeler on  09/24/2017 14:22:56 Angela Patterson ([id number]) -------------------------------------------------------------------------------- SuperBill Details Patient Name: Angela Patterson. Date of Service: 09/24/2017 Medical Record Number: [id number] Patient Account Number: [id number] Date of Birth/Sex: Mar 11, 1921 (84 y.o. Female) Treating RN: Montey Hora Primary Care Provider: Einar Pheasant Other Clinician: Referring Provider: Einar Pheasant Treating Provider/Extender: Melburn Hake, Kyrah Schiro Weeks in Treatment: 2 Diagnosis Coding ICD-10 Codes Code Description E11.621 Type 2 diabetes mellitus with 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 with fat layer exposed E11.40 Type 2 diabetes mellitus with diabetic neuropathy, unspecified L84 Corns and callosities I10 Essential (primary) hypertension Facility Procedures CPT4 Code: [id number] Description: 09381 - DEB SUBQ TISSUE 20 SQ CM/< ICD-10 Diagnosis Description L97.522 Non-pressure chronic ulcer of other part of left foot with fat Modifier: layer exposed Quantity: 1 Physician Procedures CPT4 Code: [id number] Description: 11042 - WC PHYS SUBQ TISS 20 SQ CM ICD-10 Diagnosis Description L97.522 Non-pressure chronic ulcer of other part of left foot with fat Modifier: layer exposed Quantity: 1 Electronic Signature(s) Signed: 09/24/2017 6:07:32 PM By: Worthy Keeler PA-C Entered By: Worthy Keeler on 09/24/2017 14:34:17

## 2017-09-30 NOTE — Progress Notes (Signed)
Angela Patterson, Angela Patterson ([id number]) Visit Report for 09/24/2017 Arrival Information Details Patient Name: Angela Patterson, Angela Patterson. Date of Service: 09/24/2017 11:15 AM Medical Record Number: [id number] Patient Account Number: [id number] Date of Birth/Sex: [birth year] (84 y.o. Female) Treating RN: Roger Shelter Primary Care Mardell Suttles: Einar Pheasant Other Clinician: Referring Gwenevere Goga: Einar Pheasant Treating Misti Towle/Extender: Melburn Hake, HOYT Weeks in Treatment: 2 Visit Information History Since Last Visit All ordered tests and consults were completed: No Patient Arrived: Ambulatory Added or deleted any medications: No Arrival Time: 11:07 Any new allergies or adverse reactions: No Accompanied By: daughter Had a fall or experienced change in No Transfer Assistance: None activities of daily living that may affect Patient Identification Verified: Yes risk of falls: Secondary Verification Process Completed: Yes Signs or symptoms of abuse/neglect since last visito No Patient Has Alerts: Yes Hospitalized since last visit: No Patient Alerts: DMII Pain Present Now: No Electronic Signature(s) Signed: 09/29/2017 8:28:46 AM By: Roger Shelter Entered By: Roger Shelter on 09/24/2017 11:13:37 Angela Patterson ([id number]) -------------------------------------------------------------------------------- Encounter Discharge Information Details Patient Name: Angela Patterson. Date of Service: 09/24/2017 11:15 AM Medical Record Number: [id number] Patient Account Number: [id number] Date of Birth/Sex: 07/13/1921 (84 y.o. Female) Treating RN: Montey Hora Primary Care Pheonix Clinkscale: Einar Pheasant Other Clinician: Referring Laiah Pouncey: Einar Pheasant Treating Dorman Calderwood/Extender: Melburn Hake, HOYT Weeks in Treatment: 2 Encounter Discharge Information Items Discharge Pain Level: 0 Discharge Condition: Stable Ambulatory Status: Ambulatory Discharge Destination: Home Transportation: Private Auto Accompanied By:  daughter Schedule Follow-up Appointment: Yes Medication Reconciliation completed and No provided to Patient/Care Montoya Watkin: Provided on Clinical Summary of Care: 09/24/2017 Form Type Recipient Paper Patient EG Electronic Signature(s) Signed: 09/24/2017 4:41:33 PM By: Alric Quan Entered By: Alric Quan on 09/24/2017 11:34:19 Angela Patterson ([id number]) -------------------------------------------------------------------------------- Lower Extremity Assessment Details Patient Name: Angela Patterson. Date of Service: 09/24/2017 11:15 AM Medical Record Number: [id number] Patient Account Number: [id number] Date of Birth/Sex: 05-28-1921 (84 y.o. Female) Treating RN: Roger Shelter Primary Care Shikara Mcauliffe: Einar Pheasant Other Clinician: Referring Coralie Stanke: Einar Pheasant Treating Donye Dauenhauer/Extender: Melburn Hake, HOYT Weeks in Treatment: 2 Edema Assessment Assessed: [Left: No] [Right: No] Edema: [Left: N] [Right: o] Vascular Assessment Claudication: Claudication Assessment [Left:None] Pulses: Dorsalis Pedis Palpable: [Left:Yes] Posterior Tibial Extremity colors, hair growth, and conditions: Extremity Color: [Left:Normal] Hair Growth on Extremity: [Left:No] Temperature of Extremity: [Left:Warm] Capillary Refill: [Left:< 3 seconds] Toe Nail Assessment Left: Right: Thick: Yes Discolored: Yes Deformed: Yes Improper Length and Hygiene: Yes Electronic Signature(s) Signed: 09/29/2017 8:28:46 AM By: Roger Shelter Entered By: Roger Shelter on 09/24/2017 11:15:40 Angela Patterson ([id number]) -------------------------------------------------------------------------------- Multi Wound Chart Details Patient Name: Angela Patterson. Date of Service: 09/24/2017 11:15 AM Medical Record Number: [id number] Patient Account Number: [id number] Date of Birth/Sex: [birth year] (84 y.o. Female) Treating RN: Montey Hora Primary Care Tracye Szuch: Einar Pheasant Other Clinician: Referring Merisa Julio:  Einar Pheasant Treating Torion Hulgan/Extender: Melburn Hake, HOYT Weeks in Treatment: 2 Vital Signs Height(in): 65 Pulse(bpm): 49 Weight(lbs): 107 Blood Pressure(mmHg): 188/53 Body Mass Index(BMI): 18 Temperature(F): 98.2 Respiratory Rate 18 (breaths/min): Photos: [4:No Photos] [N/A:N/A] Wound Location: [4:Left Metatarsal head first - Plantar] [N/A:N/A] Wounding Event: [4:Pressure Injury] [N/A:N/A] Primary Etiology: [4:Pressure Ulcer] [N/A:N/A] Comorbid History: [4:Cataracts, Glaucoma, Hypertension, Type II Diabetes, History of pressure wounds, Neuropathy] [N/A:N/A] Date Acquired: [4:08/20/2017] [N/A:N/A] Weeks of Treatment: [4:2] [N/A:N/A] Wound Status: [4:Open] [N/A:N/A] Measurements L x W x D [4:0.4x0.4x0.2] [N/A:N/A] (cm) Area (cm) : [4:0.126] [N/A:N/A] Volume (cm) : [4:0.025] [N/A:N/A] % Reduction in Area: [4:92.90%] [N/A:N/A] % Reduction in Volume: [4:97.20%] [N/A:N/A] Classification: [4:Category/Stage IV] [N/A:N/A] Exudate Amount: [  4:Large] [N/A:N/A] Exudate Type: [4:Serosanguineous] [N/A:N/A] Exudate Color: [4:red, brown] [N/A:N/A] Wound Margin: [4:Indistinct, nonvisible] [N/A:N/A] Granulation Amount: [4:Large (67-100%)] [N/A:N/A] Granulation Quality: [4:Red] [N/A:N/A] Necrotic Amount: [4:Small (1-33%)] [N/A:N/A] Necrotic Tissue: [4:Eschar, Adherent Slough] [N/A:N/A] Exposed Structures: [4:Fascia: Yes Fat Layer (Subcutaneous Tissue) Exposed: Yes Tendon: No Muscle: No Joint: No Bone: No] [N/A:N/A] Epithelialization: [4:None] [N/A:N/A] Periwound Skin Texture: [4:Callus: Yes Excoriation: No] [N/A:N/A] Induration: No Crepitus: No Rash: No Scarring: No Periwound Skin Moisture: Maceration: Yes N/A N/A Dry/Scaly: Yes Periwound Skin Color: Atrophie Blanche: No N/A N/A Cyanosis: No Ecchymosis: No Erythema: No Hemosiderin Staining: No Mottled: No Pallor: No Rubor: No Temperature: No Abnormality N/A N/A Tenderness on Palpation: Yes N/A N/A Wound  Preparation: Ulcer Cleansing: N/A N/A Rinsed/Irrigated with Saline Topical Anesthetic Applied: Other: lidocaine 4% Treatment Notes Electronic Signature(s) Signed: 09/24/2017 4:45:57 PM By: Montey Hora Entered By: Montey Hora on 09/24/2017 11:24:50 Angela Patterson ([id number]) -------------------------------------------------------------------------------- [place] Details Patient Name: Angela Patterson. Date of Service: 09/24/2017 11:15 AM Medical Record Number: [id number] Patient Account Number: [id number] Date of Birth/Sex: Jul 29, 1920 (84 y.o. Female) Treating RN: Montey Hora Primary Care Terrence Wishon: Einar Pheasant Other Clinician: Referring Marty Sadlowski: Einar Pheasant Treating Cameron Katayama/Extender: Melburn Hake, HOYT Weeks in Treatment: 2 Active Inactive ` Abuse / Safety / Falls / Self Care Management Nursing Diagnoses: Potential for falls Goals: Patient will not experience any injury related to falls Date Initiated: 09/10/2017 Target Resolution Date: 11/27/2017 Goal Status: Active Interventions: Assess fall risk on admission and as needed Notes: ` Orientation to the Wound Care Program Nursing Diagnoses: Knowledge deficit related to the wound healing center program Goals: Patient/caregiver will verbalize understanding of the [place] Date Initiated: 09/10/2017 Target Resolution Date: 11/27/2017 Goal Status: Active Interventions: Provide education on orientation to the wound center Notes: ` Wound/Skin Impairment Nursing Diagnoses: Impaired tissue integrity Goals: Ulcer/skin breakdown will heal within 14 weeks Date Initiated: 09/10/2017 Target Resolution Date: 11/27/2017 Goal Status: Active Interventions: ADEN, YOUNGMAN ([id number]) Assess patient/caregiver ability to obtain necessary supplies Assess patient/caregiver ability to perform ulcer/skin care regimen upon admission and as needed Assess ulceration(s) every  visit Notes: Electronic Signature(s) Signed: 09/24/2017 4:45:57 PM By: Montey Hora Entered By: Montey Hora on 09/24/2017 11:24:40 Angela Patterson ([id number]) -------------------------------------------------------------------------------- Pain Assessment Details Patient Name: Angela Patterson. Date of Service: 09/24/2017 11:15 AM Medical Record Number: [id number] Patient Account Number: [id number] Date of Birth/Sex: Sep 13, 1920 (84 y.o. Female) Treating RN: Roger Shelter Primary Care Elbridge Magowan: Einar Pheasant Other Clinician: Referring Kimimila Tauzin: Einar Pheasant Treating Willisha Sligar/Extender: Melburn Hake, HOYT Weeks in Treatment: 2 Active Problems Location of Pain Severity and Description of Pain Patient Has Paino No Site Locations Pain Management and Medication Current Pain Management: Electronic Signature(s) Signed: 09/29/2017 8:28:46 AM By: Roger Shelter Entered By: Roger Shelter on 09/24/2017 11:08:22 Angela Patterson ([id number]) -------------------------------------------------------------------------------- Patient/Caregiver Education Details Patient Name: Angela Patterson. Date of Service: 09/24/2017 11:15 AM Medical Record Number: [id number] Patient Account Number: [id number] Date of Birth/Gender: 09/18/1920 (84 y.o. Female) Treating RN: Carolyne Fiscal, Debi Primary Care Physician: Einar Pheasant Other Clinician: Referring Physician: Einar Pheasant Treating Physician/Extender: Sharalyn Ink in Treatment: 2 Education Assessment Education Provided To: Patient Education Topics Provided Wound/Skin Impairment: Handouts: Caring for Your Ulcer, Other: change dressing as ordered Methods: Demonstration, Explain/Verbal Responses: State content correctly Electronic Signature(s) Signed: 09/24/2017 4:41:33 PM By: Alric Quan Entered By: Alric Quan on 09/24/2017 11:34:41 Angela Patterson  ([id number]) -------------------------------------------------------------------------------- Wound Assessment Details Patient Name: Angela Patterson. Date of Service: 09/24/2017 11:15 AM Medical Record  Number: [id number] Patient Account Number: [id number] Date of Birth/Sex: [birth year] (84 y.o. Female) Treating RN: Roger Shelter Primary Care Thressa Shiffer: Einar Pheasant Other Clinician: Referring Traylon Schimming: Einar Pheasant Treating Khamauri Bauernfeind/Extender: Melburn Hake, HOYT Weeks in Treatment: 2 Wound Status Wound Number: 4 Primary Pressure Ulcer Etiology: Wound Location: Left Metatarsal head first - Plantar Wound Open Wounding Event: Pressure Injury Status: Date Acquired: 08/20/2017 Comorbid Cataracts, Glaucoma, Hypertension, Type II Weeks Of Treatment: 2 History: Diabetes, History of pressure wounds, Clustered Wound: No Neuropathy Photos Photo Uploaded By: Roger Shelter on 09/24/2017 16:25:42 Wound Measurements Length: (cm) 0.4 Width: (cm) 0.4 Depth: (cm) 0.2 Area: (cm) 0.126 Volume: (cm) 0.025 % Reduction in Area: 92.9% % Reduction in Volume: 97.2% Epithelialization: None Tunneling: No Undermining: No Wound Description Classification: Category/Stage IV Wound Margin: Indistinct, nonvisible Exudate Amount: Large Exudate Type: Serosanguineous Exudate Color: red, brown Foul Odor After Cleansing: No Slough/Fibrino Yes Wound Bed Granulation Amount: Large (67-100%) Exposed Structure Granulation Quality: Red Fascia Exposed: Yes Necrotic Amount: Small (1-33%) Fat Layer (Subcutaneous Tissue) Exposed: Yes Necrotic Quality: Eschar, Adherent Slough Tendon Exposed: No Muscle Exposed: No Joint Exposed: No Bone Exposed: No Periwound Skin Texture Angela Patterson, Angela Patterson. ([id number]) Texture Color No Abnormalities Noted: No No Abnormalities Noted: No Callus: Yes Atrophie Blanche: No Crepitus: No Cyanosis: No Excoriation: No Ecchymosis: No Induration: No Erythema: No Rash:  No Hemosiderin Staining: No Scarring: No Mottled: No Pallor: No Moisture Rubor: No No Abnormalities Noted: No Dry / Scaly: Yes Temperature / Pain Maceration: Yes Temperature: No Abnormality Tenderness on Palpation: Yes Wound Preparation Ulcer Cleansing: Rinsed/Irrigated with Saline Topical Anesthetic Applied: Other: lidocaine 4%, Treatment Notes Wound #4 (Left, Plantar Metatarsal head first) 1. Cleansed with: Clean wound with Normal Saline 4. Dressing Applied: Other dressing (specify in notes) 5. Secondary Dressing Applied Bordered Foam Dressing Notes silvercel Electronic Signature(s) Signed: 09/29/2017 8:28:46 AM By: Roger Shelter Entered By: Roger Shelter on 09/24/2017 11:14:24 Angela Patterson ([id number]) -------------------------------------------------------------------------------- Vitals Details Patient Name: Angela Patterson. Date of Service: 09/24/2017 11:15 AM Medical Record Number: [id number] Patient Account Number: [id number] Date of Birth/Sex: 07-01-1921 (84 y.o. Female) Treating RN: Roger Shelter Primary Care Matteus Mcnelly: Einar Pheasant Other Clinician: Referring Prabhjot Piscitello: Einar Pheasant Treating Jaila Schellhorn/Extender: Melburn Hake, HOYT Weeks in Treatment: 2 Vital Signs Time Taken: 11:08 Temperature (F): 98.2 Height (in): 65 Pulse (bpm): 68 Weight (lbs): 107 Respiratory Rate (breaths/min): 18 Body Mass Index (BMI): 17.8 Blood Pressure (mmHg): 188/53 Reference Range: 80 - 120 mg / dl Electronic Signature(s) Signed: 09/29/2017 8:28:46 AM By: Roger Shelter Entered By: Roger Shelter on 09/24/2017 11:08:44

## 2017-10-05 ENCOUNTER — Ambulatory Visit: Payer: Medicare Other | Admitting: Physician Assistant

## 2017-10-05 DIAGNOSIS — S80211A Abrasion, right knee, initial encounter: Secondary | ICD-10-CM | POA: Diagnosis not present

## 2017-10-05 DIAGNOSIS — S41112A Laceration without foreign body of left upper arm, initial encounter: Secondary | ICD-10-CM | POA: Diagnosis not present

## 2017-10-06 ENCOUNTER — Ambulatory Visit: Payer: Medicare Other | Admitting: Internal Medicine

## 2017-10-07 ENCOUNTER — Telehealth: Payer: Self-pay

## 2017-10-07 NOTE — Telephone Encounter (Signed)
Noted.  Will call if problems.

## 2017-10-07 NOTE — Telephone Encounter (Signed)
Received MyChart message from daughter [name] stating that patient fell 2 days ago and cut her forearm on a brick carport. Patients daughter cleaned area and covered with band aid once bleeding had stopped. The next day when they tried to change the bandage it tore the skin even more. Daughter took patient to [facility] where they cleaned area with saline and re-wrapped skin tear. Patient is showing no signs of infection at this time. Advised daughter that we normally do not treat with antibiotics unless showing signs of infection. Told daughter if she felt like patient should be seen we could work her in tomorrow at 12:00 but also advised that since no acute issues at this time and no sign of infection, they could monitor area and call us if area becomes worse or patient develops acute symptoms. Daughter agreed and stated she would monitor and call If needed.

## 2017-10-08 ENCOUNTER — Encounter: Payer: Medicare Other | Admitting: Physician Assistant

## 2017-10-08 ENCOUNTER — Ambulatory Visit: Payer: Medicare Other | Admitting: Physician Assistant

## 2017-10-08 DIAGNOSIS — I10 Essential (primary) hypertension: Secondary | ICD-10-CM | POA: Diagnosis not present

## 2017-10-08 DIAGNOSIS — L97509 Non-pressure chronic ulcer of other part of unspecified foot with unspecified severity: Secondary | ICD-10-CM | POA: Diagnosis not present

## 2017-10-08 DIAGNOSIS — Z79899 Other long term (current) drug therapy: Secondary | ICD-10-CM | POA: Diagnosis not present

## 2017-10-08 DIAGNOSIS — E1140 Type 2 diabetes mellitus with diabetic neuropathy, unspecified: Secondary | ICD-10-CM | POA: Diagnosis not present

## 2017-10-08 DIAGNOSIS — Z8673 Personal history of transient ischemic attack (TIA), and cerebral infarction without residual deficits: Secondary | ICD-10-CM | POA: Diagnosis not present

## 2017-10-08 DIAGNOSIS — L97522 Non-pressure chronic ulcer of other part of left foot with fat layer exposed: Secondary | ICD-10-CM | POA: Diagnosis not present

## 2017-10-08 DIAGNOSIS — L84 Corns and callosities: Secondary | ICD-10-CM | POA: Diagnosis not present

## 2017-10-08 DIAGNOSIS — M8580 Other specified disorders of bone density and structure, unspecified site: Secondary | ICD-10-CM | POA: Diagnosis not present

## 2017-10-08 DIAGNOSIS — L89894 Pressure ulcer of other site, stage 4: Secondary | ICD-10-CM | POA: Diagnosis not present

## 2017-10-08 DIAGNOSIS — Z7984 Long term (current) use of oral hypoglycemic drugs: Secondary | ICD-10-CM | POA: Diagnosis not present

## 2017-10-08 DIAGNOSIS — E11621 Type 2 diabetes mellitus with foot ulcer: Secondary | ICD-10-CM | POA: Diagnosis not present

## 2017-10-11 NOTE — Progress Notes (Signed)
Angela Patterson, Angela Patterson ([id number]) Visit Report for 10/08/2017 Chief Complaint Document Details Patient Name: Angela Patterson, Angela Patterson. Date of Service: 10/08/2017 11:00 AM Medical Record Number: [id number] Patient Account Number: [id number] Date of Birth/Sex: 11/03/1920 (84 y.o. F) Treating RN: Montey Hora Primary Care Provider: Einar Pheasant Other Clinician: Referring Provider: Einar Pheasant Treating Provider/Extender: Melburn Hake, HOYT Weeks in Treatment: 4 Information Obtained from: Patient Chief Complaint Left plantar foot ulcer Electronic Signature(s) Signed: 10/10/2017 11:36:05 PM By: Worthy Keeler PA-C Entered By: Worthy Keeler on 10/08/2017 11:10:04 Angela Patterson ([id number]) -------------------------------------------------------------------------------- Debridement Details Patient Name: Angela Patterson. Date of Service: 10/08/2017 11:00 AM Medical Record Number: [id number] Patient Account Number: [id number] Date of Birth/Sex: 11/17/1920 (84 y.o. F) Treating RN: Roger Shelter Primary Care Provider: Einar Pheasant Other Clinician: Referring Provider: Einar Pheasant Treating Provider/Extender: Melburn Hake, HOYT Weeks in Treatment: 4 Debridement Performed for Wound #4 Left,Plantar Metatarsal head first Assessment: Performed By: Physician STONE III, HOYT E., PA-C Debridement Type: Debridement Pre-procedure Verification/Time Yes - 11:43 Out Taken: Start Time: 11:43 Pain Control: Lidocaine 4% Topical Solution Level: Skin/Subcutaneous Tissue Total Area Debrided (L x W): 0.5 (cm) x 0.5 (cm) = 0.25 (cm) Tissue and other material Viable, Non-Viable, Callus, Fibrin/Slough, Subcutaneous debrided: Instrument: Curette Bleeding: Minimum Hemostasis Achieved: Silver Nitrate End Time: 11:49 Procedural Pain: 0 Post Procedural Pain: 0 Response to Treatment: Procedure was tolerated well Post Debridement Measurements of Total Wound Length: (cm) 0.7 Stage: Category/Stage IV Width: (cm)  0.5 Depth: (cm) 0.3 Volume: (cm) 0.082 Character of Wound/Ulcer Post Improved Debridement: Post Procedure Diagnosis Same as Pre-procedure Electronic Signature(s) Signed: 10/10/2017 11:36:05 PM By: Worthy Keeler PA-C Signed: 10/11/2017 4:23:05 PM By: Roger Shelter Entered By: Roger Shelter on 10/08/2017 11:50:07 Angela Patterson ([id number]) -------------------------------------------------------------------------------- HPI Details Patient Name: Angela Patterson. Date of Service: 10/08/2017 11:00 AM Medical Record Number: [id number] Patient Account Number: [id number] Date of Birth/Sex: October 26, 1920 (84 y.o. F) Treating RN: Montey Hora Primary Care Provider: Einar Pheasant Other Clinician: Referring Provider: Einar Pheasant Treating Provider/Extender: Melburn Hake, HOYT Weeks in Treatment: 4 History of Present Illness HPI Description: this very pleasant 84 year old patient who is extremely active and ambulating all day has had problems with the left foot for over a year. Her daughter who is her caregiver says she drives around and even goes around and does her chores by herself. this is the third recurrence and after the last time we saw in June and discharged her her daughter has got her to pace of diabetic shoes and she's been wearing these regularly. She has been on the shoes for about 6 months now. Her diabetes is under control and her blood sugars run from a anywhere between 110 to 130 and she checks them twice a day. no fever or discharge. During her initial visit, an x-ray of her left foot was done on 09/26/2014. There was no cortical erosion noted there was mild osteopenia in the distal first metatarsal. Radiologist has recommended a MRI of the left foot to exclude osteomyelitis. MRI done on 10/09/2014 reveals that the area where she had a phlegmon is without osteomyelitis. the bone marrow and other areas are compatible with reactive edema rather than  osteomyelitis. 08/06/2015 -- x-ray of the left foot done on 07/30/2015 shows IMPRESSION:Soft tissue wound with a bandage is noted over the plantar aspect of the distal left foot. Diffuse osteopenia degenerative change. No acute bony abnormality 09/16/2015 -- last week she had gone to [facility] and met with  Ross who had spoken to me over the phone. Instead of a DH walking boot he has given her an offloading boot which will make her more steady and this boot is called DH healing sandal with a custom-made insole to offload this area. Readmission: 09/10/17 on evaluation today patient presents for reevaluation in regard to her left plantar foot ulcer has actually been since 2017 since we have last seen her. With that being said she has seem to do fairly well in the interim since that time. Patient is seen with both her daughter as well as her son present for today's evaluation. Her son tells me that they have ordered diabetic shoes for her which obviously are custom-made and that they were hoping these would be available today to bring in. With that being said she does also have some of the boots left that she had previously for offloading and they brought those with them today as well. With that being said up to this point patient has just been using her tennis shoes in regard to her footwear. She has had drainage from the ulcer and it really does not seem to be improving significantly at this time. She does have a history of diabetes mellitus type II which she is on medication and she is also on a blood thinner. Fortunately she does not seem to have any evidence of discomfort due to the diabetic neuropathy. Patient does have a very high arch which is why she seems to get pressure to this area of the first metatarsal on the left foot leading to the ulcerations that develop. The current ulceration is in the exact location of her prior ulceration. She has not had a recent  x-ray. 09/17/17 On evaluation today patient appears to be doing well in regard to her left plantar foot ulcer. Since last week she has had excellent filling in of the wound and this is definitely much smaller than previously noted. With that being said she is having no pain she has neuropathy and therefore does not feel any pain anyway. There's no evidence of infection which is great news. She does have callous but again this is not nearly as significant as previously noted. Overall I'm definitely pleased with how things stand today. 09/24/17 on evaluation today patient's left plantar foot ulcer appears to show signs of good improvement compared even to last week's evaluation. She is slowly healing and again from the first time I saw this this definitely appears to be doing much better. She's not have any discomfort which is great news unfortunately most of this is actually due to the fact that she has neuropathy. Other than that however she has been tolerating the dressing changes without complication she actually performs the dressing changes herself. Angela Patterson, Angela Patterson ([id number]) 10/08/17 on evaluation today patient's foot ulcer on the right actually appears to be doing a little bit worse unfortunately. She has been tolerating the dressing changes fairly well she performs these herself. Although I'm unsure that she has really been packing the alginate into the wound I have discussed this with her before but it sounds as if she may not be. Nonetheless I did have a discussion concerning this again today with the daughter present as well as her son and other daughter whom I just met today. Patient did get her custom shoes which finally arrived which is definitely good news. These do appear to be excellent I did check them out in the offloading as well as our support is  great. Electronic Signature(s) Signed: 10/10/2017 11:36:05 PM By: Worthy Keeler PA-C Entered By: Worthy Keeler on 10/08/2017  13:16:47 Angela Patterson ([id number]) -------------------------------------------------------------------------------- Physical Exam Details Patient Name: Angela Patterson. Date of Service: 10/08/2017 11:00 AM Medical Record Number: [id number] Patient Account Number: [id number] Date of Birth/Sex: 1921-03-02 (84 y.o. F) Treating RN: Montey Hora Primary Care Provider: Einar Pheasant Other Clinician: Referring Provider: Einar Pheasant Treating Provider/Extender: STONE III, HOYT Weeks in Treatment: 4 Constitutional Well-nourished and well-hydrated in no acute distress. Respiratory normal breathing without difficulty. Psychiatric this patient is able to make decisions and demonstrates good insight into disease process. Alert and Oriented x 3. pleasant and cooperative. Notes Patient's wound bed did show significant callous buildup there was some maceration as well we're fluid is being trapped underneath the callous. I did sharply debride the wound today she did have quite a bit of bleeding which required chemical cauterization with silver nitrate to get the bleeding to stop. Post debridement the wound did appear to be better I think this can do well but I do believe are going to have to be sure to keep everything under control as far as fluid management is concerned as well is keeping the callous from overlapping the wound bed and trapping fluid. Electronic Signature(s) Signed: 10/10/2017 11:36:05 PM By: Worthy Keeler PA-C Entered By: Worthy Keeler on 10/08/2017 13:17:34 Angela Patterson ([id number]) -------------------------------------------------------------------------------- Physician Orders Details Patient Name: Angela Patterson. Date of Service: 10/08/2017 11:00 AM Medical Record Number: [id number] Patient Account Number: [id number] Date of Birth/Sex: 07/28/1920 (84 y.o. F) Treating RN: Roger Shelter Primary Care Provider: Einar Pheasant Other Clinician: Referring Provider:  Einar Pheasant Treating Provider/Extender: Melburn Hake, HOYT Weeks in Treatment: 4 Verbal / Phone Orders: No Diagnosis Coding ICD-10 Coding Code Description E11.621 Type 2 diabetes mellitus with 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 with fat layer exposed E11.40 Type 2 diabetes mellitus with diabetic neuropathy, unspecified L84 Corns and callosities I10 Essential (primary) hypertension Wound Cleansing Wound #4 Left,Plantar Metatarsal head first o Clean wound with Normal Saline. o May Shower, gently pat wound dry prior to applying new dressing. Anesthetic (add to Medication List) Wound #4 Left,Plantar Metatarsal head first o Topical Lidocaine 4% cream applied to wound bed prior to debridement (In Clinic Only). Primary Wound Dressing Wound #4 Left,Plantar Metatarsal head first o Silver Alginate Secondary Dressing Wound #4 Left,Plantar Metatarsal head first o Dry Gauze o Boardered Foam Dressing - or telfa island Dressing Change Frequency Wound #4 Left,Plantar Metatarsal head first o Change dressing every other day. Follow-up Appointments Wound #4 Left,Plantar Metatarsal head first o Return Appointment in 1 week. Additional Orders / Instructions Wound #4 Left,Plantar Metatarsal head first o Increase protein intake. o Activity as tolerated Angela Patterson, Angela Patterson ([id number]) Electronic Signature(s) Signed: 10/08/2017 11:47:06 AM By: Roger Shelter Signed: 10/10/2017 11:36:05 PM By: Worthy Keeler PA-C Entered By: Roger Shelter on 10/08/2017 11:41:03 Angela Patterson ([id number]) -------------------------------------------------------------------------------- Problem List Details Patient Name: Angela Patterson. Date of Service: 10/08/2017 11:00 AM Medical Record Number: [id number] Patient Account Number: [id number] Date of Birth/Sex: February 12, 1921 (84 y.o. F) Treating RN: Montey Hora Primary Care Provider: Einar Pheasant Other  Clinician: Referring Provider: Einar Pheasant Treating Provider/Extender: Melburn Hake, HOYT Weeks in Treatment: 4 Active Problems ICD-10 Impacting Encounter Code Description Active Date Wound Healing Diagnosis E11.621 Type 2 diabetes mellitus with foot ulcer 09/10/2017 Yes L97.522 Non-pressure chronic ulcer of other part of left foot with fat 09/10/2017 Yes  layer exposed E11.40 Type 2 diabetes mellitus with diabetic neuropathy, 09/10/2017 Yes unspecified L84 Corns and callosities 09/10/2017 Yes I10 Essential (primary) hypertension 09/10/2017 Yes Inactive Problems Resolved Problems Electronic Signature(s) Signed: 10/10/2017 11:36:05 PM By: Worthy Keeler PA-C Entered By: Worthy Keeler on 10/08/2017 11:09:47 Angela Patterson ([id number]) -------------------------------------------------------------------------------- Progress Note Details Patient Name: Angela Patterson. Date of Service: 10/08/2017 11:00 AM Medical Record Number: [id number] Patient Account Number: [id number] Date of Birth/Sex: [birth year] (84 y.o. F) Treating RN: Montey Hora Primary Care Provider: Einar Pheasant Other Clinician: Referring Provider: Einar Pheasant Treating Provider/Extender: Melburn Hake, HOYT Weeks in Treatment: 4 Subjective Chief Complaint Information obtained from Patient Left plantar foot ulcer History of Present Illness (HPI) this very pleasant 84 year old patient who is extremely active and ambulating all day has had problems with the left foot for over a year. Her daughter who is her caregiver says she drives around and even goes around and does her chores by herself. this is the third recurrence and after the last time we saw in June and discharged her her daughter has got her to pace of diabetic shoes and she's been wearing these regularly. She has been on the shoes for about 6 months now. Her diabetes is under control and her blood sugars run from a anywhere between 110 to 130 and she checks  them twice a day. no fever or discharge. During her initial visit, an x-ray of her left foot was done on 09/26/2014. There was no cortical erosion noted there was mild osteopenia in the distal first metatarsal. Radiologist has recommended a MRI of the left foot to exclude osteomyelitis. MRI done on 10/09/2014 reveals that the area where she had a phlegmon is without osteomyelitis. the bone marrow and other areas are compatible with reactive edema rather than osteomyelitis. 08/06/2015 -- x-ray of the left foot done on 07/30/2015 shows IMPRESSION:Soft tissue wound with a bandage is noted over the plantar aspect of the distal left foot. Diffuse osteopenia degenerative change. No acute bony abnormality 09/16/2015 -- last week she had gone to [facility] and met with Harrington Challenger who had spoken to me over the phone. Instead of a DH walking boot he has given her an offloading boot which will make her more steady and this boot is called DH healing sandal with a custom-made insole to offload this area. Readmission: 09/10/17 on evaluation today patient presents for reevaluation in regard to her left plantar foot ulcer has actually been since 2017 since we have last seen her. With that being said she has seem to do fairly well in the interim since that time. Patient is seen with both her daughter as well as her son present for today's evaluation. Her son tells me that they have ordered diabetic shoes for her which obviously are custom-made and that they were hoping these would be available today to bring in. With that being said she does also have some of the boots left that she had previously for offloading and they brought those with them today as well. With that being said up to this point patient has just been using her tennis shoes in regard to her footwear. She has had drainage from the ulcer and it really does not seem to be improving significantly at this time. She does have  a history of diabetes mellitus type II which she is on medication and she is also on a blood thinner. Fortunately she does not seem to have any evidence of  discomfort due to the diabetic neuropathy. Patient does have a very high arch which is why she seems to get pressure to this area of the first metatarsal on the left foot leading to the ulcerations that develop. The current ulceration is in the exact location of her prior ulceration. She has not had a recent x-ray. 09/17/17 On evaluation today patient appears to be doing well in regard to her left plantar foot ulcer. Since last week she has had excellent filling in of the wound and this is definitely much smaller than previously noted. With that being said she is having no pain she has neuropathy and therefore does not feel any pain anyway. There's no evidence of infection which is great news. She does have callous but again this is not nearly as significant as previously noted. Overall I'm definitely pleased with how things stand today. Angela Patterson, Angela Patterson ([id number]) 09/24/17 on evaluation today patient's left plantar foot ulcer appears to show signs of good improvement compared even to last week's evaluation. She is slowly healing and again from the first time I saw this this definitely appears to be doing much better. She's not have any discomfort which is great news unfortunately most of this is actually due to the fact that she has neuropathy. Other than that however she has been tolerating the dressing changes without complication she actually performs the dressing changes herself. 10/08/17 on evaluation today patient's foot ulcer on the right actually appears to be doing a little bit worse unfortunately. She has been tolerating the dressing changes fairly well she performs these herself. Although I'm unsure that she has really been packing the alginate into the wound I have discussed this with her before but it sounds as if she may not be.  Nonetheless I did have a discussion concerning this again today with the daughter present as well as her son and other daughter whom I just met today. Patient did get her custom shoes which finally arrived which is definitely good news. These do appear to be excellent I did check them out in the offloading as well as our support is great. Patient History Information obtained from Patient. Family History Cancer - Father, Diabetes - Siblings, Stroke - Mother, No family history of Heart Disease, Hereditary Spherocytosis, Hypertension, Kidney Disease, Lung Disease, Seizures, Thyroid Problems, Tuberculosis. Social History Never smoker, Marital Status - Widowed, Alcohol Use - Never, Drug Use - No History, Caffeine Use - Daily. Medical And Surgical History Notes Cardiovascular TIA Neurologic TIA Review of Systems (ROS) Constitutional Symptoms (General Health) Denies complaints or symptoms of Fever, Chills. Respiratory The patient has no complaints or symptoms. Cardiovascular The patient has no complaints or symptoms. Psychiatric The patient has no complaints or symptoms. Objective Constitutional Well-nourished and well-hydrated in no acute distress. Vitals Time Taken: 11:17 AM, Height: 65 in, Weight: 107 lbs, BMI: 17.8, Temperature: 97.9 F, Pulse: 69 bpm, Respiratory Rate: 18 breaths/min, Blood Pressure: 155/77 mmHg. Angela Patterson, Angela Patterson ([id number]) Respiratory normal breathing without difficulty. Psychiatric this patient is able to make decisions and demonstrates good insight into disease process. Alert and Oriented x 3. pleasant and cooperative. General Notes: Patient's wound bed did show significant callous buildup there was some maceration as well we're fluid is being trapped underneath the callous. I did sharply debride the wound today she did have quite a bit of bleeding which required chemical cauterization with silver nitrate to get the bleeding to stop. Post debridement the  wound did appear to be better I think  this can do well but I do believe are going to have to be sure to keep everything under control as far as fluid management is concerned as well is keeping the callous from overlapping the wound bed and trapping fluid. Integumentary (Hair, Skin) Wound #4 status is Open. Original cause of wound was Pressure Injury. The wound is located on the Left,Plantar Metatarsal head first. The wound measures 0.5cm length x 0.5cm width x 0.2cm depth; 0.196cm^2 area and 0.039cm^3 volume. There is Fat Layer (Subcutaneous Tissue) Exposed and fascia exposed. There is undermining starting at 12:00 and ending at 4:00 with a maximum distance of 0.6cm. There is a large amount of serosanguineous drainage noted. The wound margin is indistinct and nonvisible. There is large (67-100%) red granulation within the wound bed. There is a small (1-33%) amount of necrotic tissue within the wound bed including Eschar and Adherent Slough. The periwound skin appearance exhibited: Callus, Dry/Scaly, Maceration. The periwound skin appearance did not exhibit: Crepitus, Excoriation, Induration, Rash, Scarring, Atrophie Blanche, Cyanosis, Ecchymosis, Hemosiderin Staining, Mottled, Pallor, Rubor, Erythema. Periwound temperature was noted as No Abnormality. The periwound has tenderness on palpation. Assessment Active Problems ICD-10 E11.621 - Type 2 diabetes mellitus with foot ulcer L97.522 - Non-pressure chronic ulcer of other part of left foot with fat layer exposed E11.40 - Type 2 diabetes mellitus with diabetic neuropathy, unspecified L84 - Corns and callosities I10 - Essential (primary) hypertension Procedures Wound #4 Pre-procedure diagnosis of Wound #4 is a Pressure Ulcer located on the Left,Plantar Metatarsal head first . There was a Skin/Subcutaneous Tissue Debridement ([id number]) debridement with total area of 0.25 sq cm performed by STONE III, HOYT E., PA-C. with the following  instrument(s): Curette to remove Viable and Non-Viable tissue/material including Fibrin/Slough, Callus, and Subcutaneous after achieving pain control using Lidocaine 4% Topical Solution. A time out was conducted at 11:43, prior to the start of the procedure. A Minimum amount of bleeding was controlled with Silver Nitrate. The procedure was tolerated well with a pain level of 0 throughout and a pain level of 0 following the procedure. Post Debridement Measurements: 0.7cm length x 0.5cm width x 0.3cm depth; 0.082cm^3 volume. Post debridement Stage noted as Category/Stage IV. Character of Wound/Ulcer Post Debridement is improved. Post procedure Diagnosis Wound #4: Same as Pre-Procedure Angela Patterson, Angela Patterson. ([id number]) Plan Wound Cleansing: Wound #4 Left,Plantar Metatarsal head first: Clean wound with Normal Saline. May Shower, gently pat wound dry prior to applying new dressing. Anesthetic (add to Medication List): Wound #4 Left,Plantar Metatarsal head first: Topical Lidocaine 4% cream applied to wound bed prior to debridement (In Clinic Only). Primary Wound Dressing: Wound #4 Left,Plantar Metatarsal head first: Silver Alginate Secondary Dressing: Wound #4 Left,Plantar Metatarsal head first: Dry Gauze Boardered Foam Dressing - or telfa island Dressing Change Frequency: Wound #4 Left,Plantar Metatarsal head first: Change dressing every other day. Follow-up Appointments: Wound #4 Left,Plantar Metatarsal head first: Return Appointment in 1 week. Additional Orders / Instructions: Wound #4 Left,Plantar Metatarsal head first: Increase protein intake. Activity as tolerated I feel like at this point in this was a minor setback today and I'm hopeful that the offloading shoe that she now has will also be of benefit for her going forward as far as preventing callous buildup and complications with her wound. Patient is in agreement with plan. Her son as well as two daughters also were in agreement.  We will see her for reevaluation in one weeks time to see were things stand. Please see above for specific wound care  orders. We will see patient for re-evaluation in 1 week(s) here in the clinic. If anything worsens or changes patient will contact our office for additional recommendations. Electronic Signature(s) Signed: 10/10/2017 11:36:05 PM By: Worthy Keeler PA-C Entered By: Worthy Keeler on 10/08/2017 13:18:14 Angela Patterson ([id number]) -------------------------------------------------------------------------------- ROS/PFSH Details Patient Name: Angela Patterson. Date of Service: 10/08/2017 11:00 AM Medical Record Number: [id number] Patient Account Number: [id number] Date of Birth/Sex: January 16, 1921 (84 y.o. F) Treating RN: Montey Hora Primary Care Provider: Einar Pheasant Other Clinician: Referring Provider: Einar Pheasant Treating Provider/Extender: Melburn Hake, HOYT Weeks in Treatment: 4 Information Obtained From Patient Wound History Do you currently have one or more open woundso Yes How many open wounds do you currently haveo 1 Approximately how long have you had your woundso one month open How have you been treating your wound(s) until nowo wound cleanser Has your wound(s) ever healed and then re-openedo Yes Have you had any lab work done in the past montho No Have you tested positive for an antibiotic resistant organism (MRSA, VRE)o No Have you tested positive for osteomyelitis (bone infection)o No Have you had any tests for circulation on your legso No Constitutional Symptoms (General Health) Complaints and Symptoms: Negative for: Fever; Chills Eyes Medical History: Positive for: Cataracts - removed "a few years ago" (B ); Glaucoma Ear/Nose/Mouth/Throat Medical History: Negative for: Chronic sinus problems/congestion; Middle ear problems Hematologic/Lymphatic Medical History: Negative for: Anemia; Hemophilia; Human Immunodeficiency Virus; Lymphedema; Sickle Cell  Disease Respiratory Complaints and Symptoms: No Complaints or Symptoms Medical History: Negative for: Aspiration; Asthma; Chronic Obstructive Pulmonary Disease (COPD); Pneumothorax; Sleep Apnea; Tuberculosis Cardiovascular Complaints and Symptoms: No Complaints or Symptoms Medical History: Positive for: Hypertension Past Medical History NotesMarland Kitchen Angela Patterson, Angela Patterson ([id number]) TIA Gastrointestinal Medical History: Negative for: Cirrhosis ; Crohnos; Hepatitis A; Hepatitis B; Hepatitis C Endocrine Medical History: Positive for: Type II Diabetes Time with diabetes: 2 years Treated with: Oral agents, Diet Blood sugar tested every day: Yes Tested : twice Blood sugar testing results: Bedtime: 115 Genitourinary Medical History: Negative for: End Stage Renal Disease Immunological Medical History: Negative for: Lupus Erythematosus; Raynaudos; Scleroderma Integumentary (Skin) Medical History: Positive for: History of pressure wounds Negative for: History of Burn Musculoskeletal Medical History: Negative for: Gout; Rheumatoid Arthritis; Osteoarthritis; Osteomyelitis Neurologic Medical History: Positive for: Neuropathy Past Medical History Notes: TIA Oncologic Medical History: Negative for: Received Chemotherapy; Received Radiation Psychiatric Complaints and Symptoms: No Complaints or Symptoms HBO Extended History Items Eyes: Eyes: Cataracts Glaucoma Angela Patterson, Angela Patterson ([id number]) Immunizations Pneumococcal Vaccine: Received Pneumococcal Vaccination: Yes Implantable Devices Family and Social History Cancer: Yes - Father; Diabetes: Yes - Siblings; Heart Disease: No; Hereditary Spherocytosis: No; Hypertension: No; Kidney Disease: No; Lung Disease: No; Seizures: No; Stroke: Yes - Mother; Thyroid Problems: No; Tuberculosis: No; Never smoker; Marital Status - Widowed; Alcohol Use: Never; Drug Use: No History; Caffeine Use: Daily; Financial Concerns: No; Food, Clothing or Shelter  Needs: No; Support System Lacking: No; Transportation Concerns: No; Advanced Directives: Yes (Not Provided); Patient does not want information on Advanced Directives; Do not resuscitate: No; Living Will: Yes (Copy provided); Medical Power of Attorney: Yes - Olen Cordial- daughter (Copy provided) Physician Affirmation I have reviewed and agree with the above information. Electronic Signature(s) Signed: 10/08/2017 4:19:12 PM By: Montey Hora Signed: 10/10/2017 11:36:05 PM By: Worthy Keeler PA-C Entered By: Worthy Keeler on 10/08/2017 13:17:10 Angela Patterson ([id number]) -------------------------------------------------------------------------------- SuperBill Details Patient Name: Angela Patterson. Date of Service: 10/08/2017 Medical Record Number: [id number] Patient  Account Number: [id number] Date of Birth/Sex: [birth year] (84 y.o. F) Treating RN: Montey Hora Primary Care Provider: Einar Pheasant Other Clinician: Referring Provider: Einar Pheasant Treating Provider/Extender: Melburn Hake, HOYT Weeks in Treatment: 4 Diagnosis Coding ICD-10 Codes Code Description E11.621 Type 2 diabetes mellitus with 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 with fat layer exposed E11.40 Type 2 diabetes mellitus with diabetic neuropathy, unspecified L84 Corns and callosities I10 Essential (primary) hypertension Facility Procedures CPT4 Code: 71855015 Description: 86825 - DEB SUBQ TISSUE 20 SQ CM/< ICD-10 Diagnosis Description L97.522 Non-pressure chronic ulcer of other part of left foot with fat Modifier: layer exposed Quantity: 1 Physician Procedures CPT4 Code: 7493552 Description: 17471 - WC PHYS SUBQ TISS 20 SQ CM ICD-10 Diagnosis Description L97.522 Non-pressure chronic ulcer of other part of left foot with fat Modifier: layer exposed Quantity: 1 Electronic Signature(s) Signed: 10/10/2017 11:36:05 PM By: Worthy Keeler PA-C Entered By: Worthy Keeler on  10/08/2017 13:18:29

## 2017-10-12 NOTE — Progress Notes (Signed)
MORRIS, MARKHAM ([id number]) Visit Report for 10/08/2017 Arrival Information Details Patient Name: Angela Patterson, Angela Patterson. Date of Service: 10/08/2017 11:00 AM Medical Record Number: [id number] Patient Account Number: [id number] Date of Birth/Sex: 08-Apr-1921 (84 y.o. F) Treating RN: Roger Shelter Primary Care Madeline Pho: Einar Pheasant Other Clinician: Referring Brookie Wayment: Einar Pheasant Treating Warren Lindahl/Extender: Melburn Hake, HOYT Weeks in Treatment: 4 Visit Information History Since Last Visit All ordered tests and consults were completed: No Patient Arrived: Ambulatory Added or deleted any medications: No Arrival Time: 11:13 Any new allergies or adverse reactions: No Accompanied By: daughter Had a fall or experienced change in No Transfer Assistance: None activities of daily living that may affect Patient Identification Verified: Yes risk of falls: Secondary Verification Process Completed: Yes Signs or symptoms of abuse/neglect since last visito No Patient Has Alerts: Yes Hospitalized since last visit: No Patient Alerts: DMII Pain Present Now: No Electronic Signature(s) Signed: 10/08/2017 11:47:06 AM By: Roger Shelter Entered By: Roger Shelter on 10/08/2017 11:16:27 Angela Patterson ([id number]) -------------------------------------------------------------------------------- Encounter Discharge Information Details Patient Name: Angela Patterson. Date of Service: 10/08/2017 11:00 AM Medical Record Number: [id number] Patient Account Number: [id number] Date of Birth/Sex: Feb 05, 1921 (84 y.o. F) Treating RN: Ahmed Prima Primary Care Thelbert Gartin: Einar Pheasant Other Clinician: Referring Kaeo Jacome: Einar Pheasant Treating Terasa Orsini/Extender: Melburn Hake, HOYT Weeks in Treatment: 4 Encounter Discharge Information Items Discharge Pain Level: 0 Discharge Condition: Stable Ambulatory Status: Ambulatory Discharge Destination: Home Transportation: Private Auto Accompanied By:  family Schedule Follow-up Appointment: Yes Medication Reconciliation completed and No provided to Patient/Care Kemisha Bonnette: Provided on Clinical Summary of Care: 10/08/2017 Form Type Recipient Paper Patient EG Electronic Signature(s) Signed: 10/11/2017 10:49:50 AM By: Ruthine Dose Entered By: Ruthine Dose on 10/08/2017 12:05:29 Angela Patterson ([id number]) -------------------------------------------------------------------------------- Lower Extremity Assessment Details Patient Name: Angela Patterson. Date of Service: 10/08/2017 11:00 AM Medical Record Number: [id number] Patient Account Number: [id number] Date of Birth/Sex: [birth year] (84 y.o. F) Treating RN: Roger Shelter Primary Care Jari Carollo: Einar Pheasant Other Clinician: Referring [place] Scheiderer: Einar Pheasant Treating Chevis Weisensel/Extender: Melburn Hake, HOYT Weeks in Treatment: 4 Edema Assessment Assessed: [Left: No] [Right: No] Edema: [Left: N] [Right: o] Vascular Assessment Claudication: Claudication Assessment [Left:None] Pulses: Dorsalis Pedis Palpable: [Left:Yes] Posterior Tibial Extremity colors, hair growth, and conditions: Extremity Color: [Left:Hyperpigmented] Temperature of Extremity: [Left:Warm] Capillary Refill: [Left:< 3 seconds] Toe Nail Assessment Left: Right: Thick: No Discolored: No Deformed: No Improper Length and Hygiene: No Electronic Signature(s) Signed: 10/08/2017 11:47:06 AM By: Roger Shelter Entered By: Roger Shelter on 10/08/2017 11:25:11 Angela Patterson ([id number]) -------------------------------------------------------------------------------- Multi Wound Chart Details Patient Name: Angela Patterson. Date of Service: 10/08/2017 11:00 AM Medical Record Number: [id number] Patient Account Number: [id number] Date of Birth/Sex: 03-27-1921 (84 y.o. F) Treating RN: Roger Shelter Primary Care Gizella Belleville: Einar Pheasant Other Clinician: Referring Shanecia Hoganson: Einar Pheasant Treating  Adiel Mcnamara/Extender: Melburn Hake, HOYT Weeks in Treatment: 4 Vital Signs Height(in): 65 Pulse(bpm): 69 Weight(lbs): 107 Blood Pressure(mmHg): 155/77 Body Mass Index(BMI): 18 Temperature(F): 97.9 Respiratory Rate 18 (breaths/min): Photos: [4:No Photos] [N/A:N/A] Wound Location: [4:Left Metatarsal head first - Plantar] [N/A:N/A] Wounding Event: [4:Pressure Injury] [N/A:N/A] Primary Etiology: [4:Pressure Ulcer] [N/A:N/A] Comorbid History: [4:Cataracts, Glaucoma, Hypertension, Type II Diabetes, History of pressure wounds, Neuropathy] [N/A:N/A] Date Acquired: [4:08/20/2017] [N/A:N/A] Weeks of Treatment: [4:4] [N/A:N/A] Wound Status: [4:Open] [N/A:N/A] Measurements L x W x D [4:0.5x0.5x0.2] [N/A:N/A] (cm) Area (cm) : [4:0.196] [N/A:N/A] Volume (cm) : [4:0.039] [N/A:N/A] % Reduction in Area: [4:88.90%] [N/A:N/A] % Reduction in Volume: [4:95.60%] [N/A:N/A] Starting Position 1 [4:12] (o'clock): Ending Position 1 [4:4] (o'clock): Maximum  Distance 1 (cm): [4:0.6] Undermining: [4:Yes] [N/A:N/A] Classification: [4:Category/Stage IV] [N/A:N/A] Exudate Amount: [4:Large] [N/A:N/A] Exudate Type: [4:Serosanguineous] [N/A:N/A] Exudate Color: [4:red, brown] [N/A:N/A] Wound Margin: [4:Indistinct, nonvisible] [N/A:N/A] Granulation Amount: [4:Large (67-100%)] [N/A:N/A] Granulation Quality: [4:Red] [N/A:N/A] Necrotic Amount: [4:Small (1-33%)] [N/A:N/A] Necrotic Tissue: [4:Eschar, Adherent Slough] [N/A:N/A] Exposed Structures: [4:Fascia: Yes Fat Layer (Subcutaneous Tissue) Exposed: Yes Tendon: No] [N/A:N/A] Muscle: No Joint: No Bone: No Epithelialization: None N/A N/A Periwound Skin Texture: Callus: Yes N/A N/A Excoriation: No Induration: No Crepitus: No Rash: No Scarring: No Periwound Skin Moisture: Maceration: Yes N/A N/A Dry/Scaly: Yes Periwound Skin Color: Atrophie Blanche: No N/A N/A Cyanosis: No Ecchymosis: No Erythema: No Hemosiderin Staining: No Mottled: No Pallor:  No Rubor: No Temperature: No Abnormality N/A N/A Tenderness on Palpation: Yes N/A N/A Wound Preparation: Ulcer Cleansing: N/A N/A Rinsed/Irrigated with Saline Topical Anesthetic Applied: Other: lidocaine 4% Treatment Notes Electronic Signature(s) Signed: 10/08/2017 11:47:06 AM By: Roger Shelter Entered By: Roger Shelter on 10/08/2017 11:38:24 Angela Patterson ([id number]) -------------------------------------------------------------------------------- Cole Details Patient Name: Angela Patterson. Date of Service: 10/08/2017 11:00 AM Medical Record Number: [id number] Patient Account Number: [id number] Date of Birth/Sex: [birth year] (84 y.o. F) Treating RN: Roger Shelter Primary Care Larosa Rhines: Einar Pheasant Other Clinician: Referring Via Rosado: Einar Pheasant Treating Lemuel Boodram/Extender: Melburn Hake, HOYT Weeks in Treatment: 4 Active Inactive ` Abuse / Safety / Falls / Self Care Management Nursing Diagnoses: Potential for falls Goals: Patient will not experience any injury related to falls Date Initiated: 09/10/2017 Target Resolution Date: 11/27/2017 Goal Status: Active Interventions: Assess fall risk on admission and as needed Notes: ` Orientation to the Wound Care Program Nursing Diagnoses: Knowledge deficit related to the wound healing center program Goals: Patient/caregiver will verbalize understanding of the [facility] Date Initiated: 09/10/2017 Target Resolution Date: 11/27/2017 Goal Status: Active Interventions: Provide education on orientation to the wound center Notes: ` Wound/Skin Impairment Nursing Diagnoses: Impaired tissue integrity Goals: Ulcer/skin breakdown will heal within 14 weeks Date Initiated: 09/10/2017 Target Resolution Date: 11/27/2017 Goal Status: Active Interventions: CARRIEANNE, KLEEN ([id number]) Assess patient/caregiver ability to obtain necessary supplies Assess patient/caregiver ability to  perform ulcer/skin care regimen upon admission and as needed Assess ulceration(s) every visit Notes: Electronic Signature(s) Signed: 10/08/2017 11:47:06 AM By: Roger Shelter Entered By: Roger Shelter on 10/08/2017 11:37:53 Angela Patterson ([id number]) -------------------------------------------------------------------------------- Pain Assessment Details Patient Name: Angela Patterson. Date of Service: 10/08/2017 11:00 AM Medical Record Number: [id number] Patient Account Number: [id number] Date of Birth/Sex: 05/07/1921 (84 y.o. F) Treating RN: Roger Shelter Primary Care Bohdan Macho: Einar Pheasant Other Clinician: Referring Draylen Lobue: Einar Pheasant Treating Michaelangelo Mittelman/Extender: Melburn Hake, HOYT Weeks in Treatment: 4 Active Problems Location of Pain Severity and Description of Pain Patient Has Paino No Site Locations Pain Management and Medication Current Pain Management: Electronic Signature(s) Signed: 10/08/2017 11:47:06 AM By: Roger Shelter Entered By: Roger Shelter on 10/08/2017 11:16:50 Angela Patterson ([id number]) -------------------------------------------------------------------------------- Patient/Caregiver Education Details Patient Name: Angela Patterson. Date of Service: 10/08/2017 11:00 AM Medical Record Number: [id number] Patient Account Number: [id number] Date of Birth/Gender: 01/19/1921 (84 y.o. F) Treating RN: Ahmed Prima Primary Care Physician: Einar Pheasant Other Clinician: Referring Physician: Einar Pheasant Treating Physician/Extender: Sharalyn Ink in Treatment: 4 Education Assessment Education Provided To: Patient Education Topics Provided Wound/Skin Impairment: Handouts: Caring for Your Ulcer, Other: change dressing as ordered Methods: Demonstration, Explain/Verbal Responses: State content correctly Electronic Signature(s) Signed: 10/11/2017 4:28:49 PM By: Alric Quan Entered By: Alric Quan on 10/08/2017 11:54:42 Angela Patterson ([id number]) -------------------------------------------------------------------------------- Wound  Assessment Details Patient Name: Angela Patterson, Angela Patterson. Date of Service: 10/08/2017 11:00 AM Medical Record Number: [id number] Patient Account Number: [id number] Date of Birth/Sex: 01-03-1921 (84 y.o. F) Treating RN: Roger Shelter Primary Care Valjean Ruppel: Einar Pheasant Other Clinician: Referring Keelin Sheridan: Einar Pheasant Treating Woodrow Dulski/Extender: Melburn Hake, HOYT Weeks in Treatment: 4 Wound Status Wound Number: 4 Primary Pressure Ulcer Etiology: Wound Location: Left Metatarsal head first - Plantar Wound Open Wounding Event: Pressure Injury Status: Date Acquired: 08/20/2017 Comorbid Cataracts, Glaucoma, Hypertension, Type II Weeks Of Treatment: 4 History: Diabetes, History of pressure wounds, Clustered Wound: No Neuropathy Photos Photo Uploaded By: Roger Shelter on 10/08/2017 11:46:33 Wound Measurements Length: (cm) 0.5 Width: (cm) 0.5 Depth: (cm) 0.2 Area: (cm) 0.196 Volume: (cm) 0.039 % Reduction in Area: 88.9% % Reduction in Volume: 95.6% Epithelialization: None Undermining: Yes Starting Position (o'clock): 12 Ending Position (o'clock): 4 Maximum Distance: (cm) 0.6 Wound Description Classification: Category/Stage IV Wound Margin: Indistinct, nonvisible Exudate Amount: Large Exudate Type: Serosanguineous Exudate Color: red, brown Foul Odor After Cleansing: No Slough/Fibrino Yes Wound Bed Granulation Amount: Large (67-100%) Exposed Structure Granulation Quality: Red Fascia Exposed: Yes Necrotic Amount: Small (1-33%) Fat Layer (Subcutaneous Tissue) Exposed: Yes Necrotic Quality: Eschar, Adherent Slough Tendon Exposed: No Muscle Exposed: No Joint Exposed: No NORMAN, BIER ([id number]) Bone Exposed: No Periwound Skin Texture Texture Color No Abnormalities Noted: No No Abnormalities Noted: No Callus: Yes Atrophie Blanche: No Crepitus: No Cyanosis:  No Excoriation: No Ecchymosis: No Induration: No Erythema: No Rash: No Hemosiderin Staining: No Scarring: No Mottled: No Pallor: No Moisture Rubor: No No Abnormalities Noted: No Dry / Scaly: Yes Temperature / Pain Maceration: Yes Temperature: No Abnormality Tenderness on Palpation: Yes Wound Preparation Ulcer Cleansing: Rinsed/Irrigated with Saline Topical Anesthetic Applied: Other: lidocaine 4%, Treatment Notes Wound #4 (Left, Plantar Metatarsal head first) 1. Cleansed with: Clean wound with Normal Saline 2. Anesthetic Topical Lidocaine 4% cream to wound bed prior to debridement 4. Dressing Applied: Other dressing (specify in notes) 5. Secondary Dressing Applied Bordered Foam Dressing Notes silvercel Electronic Signature(s) Signed: 10/08/2017 11:47:06 AM By: Roger Shelter Entered By: Roger Shelter on 10/08/2017 11:23:57 Angela Patterson ([id number]) -------------------------------------------------------------------------------- Vitals Details Patient Name: Angela Patterson. Date of Service: 10/08/2017 11:00 AM Medical Record Number: [id number] Patient Account Number: [id number] Date of Birth/Sex: December 26, 1920 (84 y.o. F) Treating RN: Roger Shelter Primary Care Avonte Sensabaugh: Einar Pheasant Other Clinician: Referring Everett Ehrler: Einar Pheasant Treating Ariann Khaimov/Extender: Melburn Hake, HOYT Weeks in Treatment: 4 Vital Signs Time Taken: 11:17 Temperature (F): 97.9 Height (in): 65 Pulse (bpm): 69 Weight (lbs): 107 Respiratory Rate (breaths/min): 18 Body Mass Index (BMI): 17.8 Blood Pressure (mmHg): 155/77 Reference Range: 80 - 120 mg / dl Electronic Signature(s) Signed: 10/08/2017 11:47:06 AM By: Roger Shelter Entered By: Roger Shelter on 10/08/2017 11:17:10

## 2017-10-15 ENCOUNTER — Encounter: Payer: Medicare Other | Admitting: Physician Assistant

## 2017-10-15 DIAGNOSIS — L84 Corns and callosities: Secondary | ICD-10-CM | POA: Diagnosis not present

## 2017-10-15 DIAGNOSIS — L97522 Non-pressure chronic ulcer of other part of left foot with fat layer exposed: Secondary | ICD-10-CM | POA: Diagnosis not present

## 2017-10-15 DIAGNOSIS — E1140 Type 2 diabetes mellitus with diabetic neuropathy, unspecified: Secondary | ICD-10-CM | POA: Diagnosis not present

## 2017-10-15 DIAGNOSIS — L97509 Non-pressure chronic ulcer of other part of unspecified foot with unspecified severity: Secondary | ICD-10-CM | POA: Diagnosis not present

## 2017-10-15 DIAGNOSIS — Z8673 Personal history of transient ischemic attack (TIA), and cerebral infarction without residual deficits: Secondary | ICD-10-CM | POA: Diagnosis not present

## 2017-10-15 DIAGNOSIS — I10 Essential (primary) hypertension: Secondary | ICD-10-CM | POA: Diagnosis not present

## 2017-10-15 DIAGNOSIS — M8580 Other specified disorders of bone density and structure, unspecified site: Secondary | ICD-10-CM | POA: Diagnosis not present

## 2017-10-15 DIAGNOSIS — Z79899 Other long term (current) drug therapy: Secondary | ICD-10-CM | POA: Diagnosis not present

## 2017-10-15 DIAGNOSIS — L89894 Pressure ulcer of other site, stage 4: Secondary | ICD-10-CM | POA: Diagnosis not present

## 2017-10-15 DIAGNOSIS — Z7984 Long term (current) use of oral hypoglycemic drugs: Secondary | ICD-10-CM | POA: Diagnosis not present

## 2017-10-15 DIAGNOSIS — E11621 Type 2 diabetes mellitus with foot ulcer: Secondary | ICD-10-CM | POA: Diagnosis not present

## 2017-10-17 NOTE — Progress Notes (Signed)
MERL, GUARDINO ([id number]) Visit Report for 10/15/2017 Chief Complaint Document Details Patient Name: Angela Patterson, Angela Patterson. Date of Service: 10/15/2017 1:00 PM Medical Record Number: [id number] Patient Account Number: [id number] Date of Birth/Sex: 1921-04-08 (84 y.o. F) Treating RN: Montey Hora Primary Care Provider: Einar Pheasant Other Clinician: Referring Provider: Einar Pheasant Treating Provider/Extender: Melburn Hake, HOYT Weeks in Treatment: 5 Information Obtained from: Patient Chief Complaint Left plantar foot ulcer Electronic Signature(s) Signed: 10/15/2017 6:18:22 PM By: Worthy Keeler PA-C Entered By: Worthy Keeler on 10/15/2017 12:43:21 Angela Patterson ([id number]) -------------------------------------------------------------------------------- Debridement Details Patient Name: Angela Patterson. Date of Service: 10/15/2017 1:00 PM Medical Record Number: [id number] Patient Account Number: [id number] Date of Birth/Sex: 04-May-1921 (84 y.o. F) Treating RN: Montey Hora Primary Care Provider: Einar Pheasant Other Clinician: Referring Provider: Einar Pheasant Treating Provider/Extender: Melburn Hake, HOYT Weeks in Treatment: 5 Debridement Performed for Wound #4 Left,Plantar Metatarsal head first Assessment: Performed By: Physician STONE III, HOYT E., PA-C Debridement Type: Debridement Pre-procedure Verification/Time Yes - 13:39 Out Taken: Start Time: 13:39 Pain Control: Lidocaine 4% Topical Solution Total Area Debrided (L x W): 1 (cm) x 0.6 (cm) = 0.6 (cm) Tissue and other material Callus, Slough, Subcutaneous, Biofilm, Slough debrided: Level: Skin/Subcutaneous Tissue Debridement Description: Excisional Instrument: Curette Bleeding: Minimum Hemostasis Achieved: Pressure End Time: 13:47 Procedural Pain: 0 Post Procedural Pain: 0 Response to Treatment: Procedure was tolerated well Post Debridement Measurements of Total Wound Length: (cm) 1.1 Stage: Category/Stage  IV Width: (cm) 0.7 Depth: (cm) 0.2 Volume: (cm) 0.121 Character of Wound/Ulcer Post Improved Debridement: Post Procedure Diagnosis Same as Pre-procedure Electronic Signature(s) Signed: 10/15/2017 4:53:17 PM By: Montey Hora Signed: 10/15/2017 6:18:22 PM By: Worthy Keeler PA-C Entered By: Montey Hora on 10/15/2017 13:49:07 Angela Patterson ([id number]) -------------------------------------------------------------------------------- HPI Details Patient Name: Angela Patterson. Date of Service: 10/15/2017 1:00 PM Medical Record Number: [id number] Patient Account Number: [id number] Date of Birth/Sex: [birth year] (84 y.o. F) Treating RN: Montey Hora Primary Care Provider: Einar Pheasant Other Clinician: Referring Provider: Einar Pheasant Treating Provider/Extender: Melburn Hake, HOYT Weeks in Treatment: 5 History of Present Illness HPI Description: this very pleasant 84 year old patient who is extremely active and ambulating all day has had problems with the left foot for over a year. Her daughter who is her caregiver says she drives around and even goes around and does her chores by herself. this is the third recurrence and after the last time we saw in June and discharged her her daughter has got her to pace of diabetic shoes and she's been wearing these regularly. She has been on the shoes for about 6 months now. Her diabetes is under control and her blood sugars run from a anywhere between 110 to 130 and she checks them twice a day. no fever or discharge. During her initial visit, an x-ray of her left foot was done on 09/26/2014. There was no cortical erosion noted there was mild osteopenia in the distal first metatarsal. Radiologist has recommended a MRI of the left foot to exclude osteomyelitis. MRI done on 10/09/2014 reveals that the area where she had a phlegmon is without osteomyelitis. the bone marrow and other areas are compatible with reactive edema rather than  osteomyelitis. 08/06/2015 -- x-ray of the left foot done on 07/30/2015 shows IMPRESSION:Soft tissue wound with a bandage is noted over the plantar aspect of the distal left foot. Diffuse osteopenia degenerative change. No acute bony abnormality 09/16/2015 -- last week she had gone to [facility] and  met with Harrington Challenger who had spoken to me over the phone. Instead of a DH walking boot he has given her an offloading boot which will make her more steady and this boot is called DH healing sandal with a custom-made insole to offload this area. Readmission: 09/10/17 on evaluation today patient presents for reevaluation in regard to her left plantar foot ulcer has actually been since 2017 since we have last seen her. With that being said she has seem to do fairly well in the interim since that time. Patient is seen with both her daughter as well as her son present for today's evaluation. Her son tells me that they have ordered diabetic shoes for her which obviously are custom-made and that they were hoping these would be available today to bring in. With that being said she does also have some of the boots left that she had previously for offloading and they brought those with them today as well. With that being said up to this point patient has just been using her tennis shoes in regard to her footwear. She has had drainage from the ulcer and it really does not seem to be improving significantly at this time. She does have a history of diabetes mellitus type II which she is on medication and she is also on a blood thinner. Fortunately she does not seem to have any evidence of discomfort due to the diabetic neuropathy. Patient does have a very high arch which is why she seems to get pressure to this area of the first metatarsal on the left foot leading to the ulcerations that develop. The current ulceration is in the exact location of her prior ulceration. She has not had a recent  x-ray. 09/17/17 On evaluation today patient appears to be doing well in regard to her left plantar foot ulcer. Since last week she has had excellent filling in of the wound and this is definitely much smaller than previously noted. With that being said she is having no pain she has neuropathy and therefore does not feel any pain anyway. There's no evidence of infection which is great news. She does have callous but again this is not nearly as significant as previously noted. Overall I'm definitely pleased with how things stand today. 09/24/17 on evaluation today patient's left plantar foot ulcer appears to show signs of good improvement compared even to last week's evaluation. She is slowly healing and again from the first time I saw this this definitely appears to be doing much better. She's not have any discomfort which is great news unfortunately most of this is actually due to the fact that she has neuropathy. Other than that however she has been tolerating the dressing changes without complication she actually performs the dressing changes herself. Angela Patterson, Angela Patterson ([id number]) 10/08/17 on evaluation today patient's foot ulcer on the right actually appears to be doing a little bit worse unfortunately. She has been tolerating the dressing changes fairly well she performs these herself. Although I'm unsure that she has really been packing the alginate into the wound I have discussed this with her before but it sounds as if she may not be. Nonetheless I did have a discussion concerning this again today with the daughter present as well as her son and other daughter whom I just met today. Patient did get her custom shoes which finally arrived which is definitely good news. These do appear to be excellent I did check them out in the offloading as well as our  support is great. 10/15/17 on evaluation today patient appears to be doing little better compared to last week's evaluation. She does have a  little bit of bruising on the lateral border of the wound which seems to be indicative of some pressure occurring at that site. With that being said this is most likely as a result of the dressing slipping apparently with the new shoes this is not been staying in place as well. That's one thing we are going to have to continue to keep an eye on and see if there's any issues in that regard. Nonetheless we may need to go back to the offloading boots until we get this healed and then have her transfer back to her custom shoes depend on how things progress. Nonetheless at this time I do not see any definite or permanent damage and again she does seem to be doing a little bit better in my opinion. Electronic Signature(s) Signed: 10/15/2017 6:18:22 PM By: Worthy Keeler PA-C Entered By: Worthy Keeler on 10/15/2017 13:52:23 Angela Patterson ([id number]) -------------------------------------------------------------------------------- Physical Exam Details Patient Name: Angela Patterson. Date of Service: 10/15/2017 1:00 PM Medical Record Number: [id number] Patient Account Number: [id number] Date of Birth/Sex: Jan 28, 1921 (84 y.o. F) Treating RN: Montey Hora Primary Care Provider: Einar Pheasant Other Clinician: Referring Provider: Einar Pheasant Treating Provider/Extender: STONE III, HOYT Weeks in Treatment: 5 Constitutional Well-nourished and well-hydrated in no acute distress. Respiratory normal breathing without difficulty. Psychiatric this patient is able to make decisions and demonstrates good insight into disease process. Alert and Oriented x 3. pleasant and cooperative. Notes Patient's wound did have significant callous showing around the border of the wound and on the lateral aspect there was also a little bit of contusion evidence of injury although there did not appear to be any new opening underneath. This is something I explained to patient as well as her daughter today we're gonna  need to keep a very close eye on. Nonetheless at this time I do believe that she is doing better than last week's evaluation when I saw her ulceration. Sharp debridement was performed with good result today and post debridement the wound appear to be doing much better. Electronic Signature(s) Signed: 10/15/2017 6:18:22 PM By: Worthy Keeler PA-C Entered By: Worthy Keeler on 10/15/2017 13:53:06 Angela Patterson ([id number]) -------------------------------------------------------------------------------- Physician Orders Details Patient Name: Angela Patterson. Date of Service: 10/15/2017 1:00 PM Medical Record Number: [id number] Patient Account Number: [id number] Date of Birth/Sex: [birth year] (84 y.o. F) Treating RN: Montey Hora Primary Care Provider: Einar Pheasant Other Clinician: Referring Provider: Einar Pheasant Treating Provider/Extender: Melburn Hake, HOYT Weeks in Treatment: 5 Verbal / Phone Orders: No Diagnosis Coding ICD-10 Coding Code Description E11.621 Type 2 diabetes mellitus with 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 with fat layer exposed E11.40 Type 2 diabetes mellitus with diabetic neuropathy, unspecified L84 Corns and callosities I10 Essential (primary) hypertension Wound Cleansing Wound #4 Left,Plantar Metatarsal head first o Clean wound with Normal Saline. o May Shower, gently pat wound dry prior to applying new dressing. Anesthetic (add to Medication List) Wound #4 Left,Plantar Metatarsal head first o Topical Lidocaine 4% cream applied to wound bed prior to debridement (In Clinic Only). Primary Wound Dressing Wound #4 Left,Plantar Metatarsal head first o Silver Alginate Secondary Dressing Wound #4 Left,Plantar Metatarsal head first o Dry Gauze o Boardered Foam Dressing - or [place] with foam for padding Dressing Change Frequency Wound #4 Left,Plantar Metatarsal head first   o Change dressing every other  day. Follow-up Appointments Wound #4 Left,Plantar Metatarsal head first o Return Appointment in 1 week. Additional Orders / Instructions Wound #4 Left,Plantar Metatarsal head first o Increase protein intake. o Activity as tolerated Angela Patterson, Angela Patterson ([id number]) Electronic Signature(s) Signed: 10/15/2017 4:53:17 PM By: Montey Hora Signed: 10/15/2017 6:18:22 PM By: Worthy Keeler PA-C Entered By: Montey Hora on 10/15/2017 13:49:51 Angela Patterson ([id number]) -------------------------------------------------------------------------------- Problem List Details Patient Name: Angela Patterson. Date of Service: 10/15/2017 1:00 PM Medical Record Number: [id number] Patient Account Number: [id number] Date of Birth/Sex: 1921-04-02 (84 y.o. F) Treating RN: Montey Hora Primary Care Provider: Einar Pheasant Other Clinician: Referring Provider: Einar Pheasant Treating Provider/Extender: Melburn Hake, HOYT Weeks in Treatment: 5 Active Problems ICD-10 Impacting Encounter Code Description Active Date Wound Healing Diagnosis E11.621 Type 2 diabetes mellitus with foot ulcer 09/10/2017 Yes L97.522 Non-pressure chronic ulcer of other part of left foot with fat 09/10/2017 Yes layer exposed E11.40 Type 2 diabetes mellitus with diabetic neuropathy, 09/10/2017 Yes unspecified L84 Corns and callosities 09/10/2017 Yes I10 Essential (primary) hypertension 09/10/2017 Yes Inactive Problems Resolved Problems Electronic Signature(s) Signed: 10/15/2017 6:18:22 PM By: Worthy Keeler PA-C Entered By: Worthy Keeler on 10/15/2017 12:43:15 Angela Patterson ([id number]) -------------------------------------------------------------------------------- Progress Note Details Patient Name: Angela Patterson. Date of Service: 10/15/2017 1:00 PM Medical Record Number: [id number] Patient Account Number: [id number] Date of Birth/Sex: 07-16-1921 (84 y.o. F) Treating RN: Montey Hora Primary Care Provider: Einar Pheasant Other Clinician: Referring Provider: Einar Pheasant Treating Provider/Extender: Melburn Hake, HOYT Weeks in Treatment: 5 Subjective Chief Complaint Information obtained from Patient Left plantar foot ulcer History of Present Illness (HPI) this very pleasant 84 year old patient who is extremely active and ambulating all day has had problems with the left foot for over a year. Her daughter who is her caregiver says she drives around and even goes around and does her chores by herself. this is the third recurrence and after the last time we saw in June and discharged her her daughter has got her to pace of diabetic shoes and she's been wearing these regularly. She has been on the shoes for about 6 months now. Her diabetes is under control and her blood sugars run from a anywhere between 110 to 130 and she checks them twice a day. no fever or discharge. During her initial visit, an x-ray of her left foot was done on 09/26/2014. There was no cortical erosion noted there was mild osteopenia in the distal first metatarsal. Radiologist has recommended a MRI of the left foot to exclude osteomyelitis. MRI done on 10/09/2014 reveals that the area where she had a phlegmon is without osteomyelitis. the bone marrow and other areas are compatible with reactive edema rather than osteomyelitis. 08/06/2015 -- x-ray of the left foot done on 07/30/2015 shows IMPRESSION:Soft tissue wound with a bandage is noted over the plantar aspect of the distal left foot. Diffuse osteopenia degenerative change. No acute bony abnormality 09/16/2015 -- last week she had gone to [facility] and met with Harrington Challenger who had spoken to me over the phone. Instead of a DH walking boot he has given her an offloading boot which will make her more steady and this boot is called DH healing sandal with a custom-made insole to offload this area. Readmission: 09/10/17 on evaluation today patient presents  for reevaluation in regard to her left plantar foot ulcer has actually been since 2017 since we have last seen her. With that  being said she has seem to do fairly well in the interim since that time. Patient is seen with both her daughter as well as her son present for today's evaluation. Her son tells me that they have ordered diabetic shoes for her which obviously are custom-made and that they were hoping these would be available today to bring in. With that being said she does also have some of the boots left that she had previously for offloading and they brought those with them today as well. With that being said up to this point patient has just been using her tennis shoes in regard to her footwear. She has had drainage from the ulcer and it really does not seem to be improving significantly at this time. She does have a history of diabetes mellitus type II which she is on medication and she is also on a blood thinner. Fortunately she does not seem to have any evidence of discomfort due to the diabetic neuropathy. Patient does have a very high arch which is why she seems to get pressure to this area of the first metatarsal on the left foot leading to the ulcerations that develop. The current ulceration is in the exact location of her prior ulceration. She has not had a recent x-ray. 09/17/17 On evaluation today patient appears to be doing well in regard to her left plantar foot ulcer. Since last week she has had excellent filling in of the wound and this is definitely much smaller than previously noted. With that being said she is having no pain she has neuropathy and therefore does not feel any pain anyway. There's no evidence of infection which is great news. She does have callous but again this is not nearly as significant as previously noted. Overall I'm definitely pleased with how things stand today. Angela Patterson, Angela Patterson ([id number]) 09/24/17 on evaluation today patient's left plantar foot ulcer  appears to show signs of good improvement compared even to last week's evaluation. She is slowly healing and again from the first time I saw this this definitely appears to be doing much better. She's not have any discomfort which is great news unfortunately most of this is actually due to the fact that she has neuropathy. Other than that however she has been tolerating the dressing changes without complication she actually performs the dressing changes herself. 10/08/17 on evaluation today patient's foot ulcer on the right actually appears to be doing a little bit worse unfortunately. She has been tolerating the dressing changes fairly well she performs these herself. Although I'm unsure that she has really been packing the alginate into the wound I have discussed this with her before but it sounds as if she may not be. Nonetheless I did have a discussion concerning this again today with the daughter present as well as her son and other daughter whom I just met today. Patient did get her custom shoes which finally arrived which is definitely good news. These do appear to be excellent I did check them out in the offloading as well as our support is great. 10/15/17 on evaluation today patient appears to be doing little better compared to last week's evaluation. She does have a little bit of bruising on the lateral border of the wound which seems to be indicative of some pressure occurring at that site. With that being said this is most likely as a result of the dressing slipping apparently with the new shoes this is not been staying in place as well. That's  one thing we are going to have to continue to keep an eye on and see if there's any issues in that regard. Nonetheless we may need to go back to the offloading boots until we get this healed and then have her transfer back to her custom shoes depend on how things progress. Nonetheless at this time I do not see any definite or permanent damage  and again she does seem to be doing a little bit better in my opinion. Patient History Information obtained from Patient. Family History Cancer - Father, Diabetes - Siblings, Stroke - Mother, No family history of Heart Disease, Hereditary Spherocytosis, Hypertension, Kidney Disease, Lung Disease, Seizures, Thyroid Problems, Tuberculosis. Social History Never smoker, Marital Status - Widowed, Alcohol Use - Never, Drug Use - No History, Caffeine Use - Daily. Medical And Surgical History Notes Cardiovascular TIA Neurologic TIA Review of Systems (ROS) Constitutional Symptoms (General Health) The patient has no complaints or symptoms. Respiratory The patient has no complaints or symptoms. Cardiovascular The patient has no complaints or symptoms. Psychiatric The patient has no complaints or symptoms. Objective Angela Patterson, Angela Patterson. ([id number]) Constitutional Well-nourished and well-hydrated in no acute distress. Vitals Time Taken: 12:59 PM, Height: 65 in, Weight: 107 lbs, BMI: 17.8, Temperature: 97.5 F, Pulse: 65 bpm, Respiratory Rate: 18 breaths/min, Blood Pressure: 148/76 mmHg. Respiratory normal breathing without difficulty. Psychiatric this patient is able to make decisions and demonstrates good insight into disease process. Alert and Oriented x 3. pleasant and cooperative. General Notes: Patient's wound did have significant callous showing around the border of the wound and on the lateral aspect there was also a little bit of contusion evidence of injury although there did not appear to be any new opening underneath. This is something I explained to patient as well as her daughter today we're gonna need to keep a very close eye on. Nonetheless at this time I do believe that she is doing better than last week's evaluation when I saw her ulceration. Sharp debridement was performed with good result today and post debridement the wound appear to be doing much better. Integumentary  (Hair, Skin) Wound #4 status is Open. Original cause of wound was Pressure Injury. The wound is located on the Left,Plantar Metatarsal head first. The wound measures 1cm length x 0.6cm width x 0.2cm depth; 0.471cm^2 area and 0.094cm^3 volume. There is Fat Layer (Subcutaneous Tissue) Exposed and fascia exposed. There is no tunneling noted, however, there is undermining starting at 11:00 and ending at 2:00 with a maximum distance of 0.4cm. There is a large amount of serosanguineous drainage noted. The wound margin is indistinct and nonvisible. There is large (67-100%) red granulation within the wound bed. There is a small (1-33%) amount of necrotic tissue within the wound bed including Eschar and Adherent Slough. The periwound skin appearance exhibited: Callus, Dry/Scaly, Maceration. The periwound skin appearance did not exhibit: Crepitus, Excoriation, Induration, Rash, Scarring, Atrophie Blanche, Cyanosis, Ecchymosis, Hemosiderin Staining, Mottled, Pallor, Rubor, Erythema. Periwound temperature was noted as No Abnormality. The periwound has tenderness on palpation. Assessment Active Problems ICD-10 E11.621 - Type 2 diabetes mellitus with foot ulcer L97.522 - Non-pressure chronic ulcer of other part of left foot with fat layer exposed E11.40 - Type 2 diabetes mellitus with diabetic neuropathy, unspecified L84 - Corns and callosities I10 - Essential (primary) hypertension Procedures Wound #4 Pre-procedure diagnosis of Wound #4 is a Pressure Ulcer located on the Left,Plantar Metatarsal head first . There was a Excisional Skin/Subcutaneous Tissue Debridement with a total area of  0.6 sq cm performed by STONE III, HOYT E., PA-C. With the following instrument(s): Curette. Material removed includes Callus, Subcutaneous Tissue, and Slough, Biofilm, and Angela Patterson, Angela Patterson. ([id number]) [place] after achieving pain control using Lidocaine 4% Topical Solution. No specimens were taken. A time out was conducted  at 13:39, prior to the start of the procedure. A Minimum amount of bleeding was controlled with Pressure. The procedure was tolerated well with a pain level of 0 throughout and a pain level of 0 following the procedure. Post Debridement Measurements: 1.1cm length x 0.7cm width x 0.2cm depth; 0.121cm^3 volume. Post debridement Stage noted as Category/Stage IV. Character of Wound/Ulcer Post Debridement is improved. Post procedure Diagnosis Wound #4: Same as Pre-Procedure Plan Wound Cleansing: Wound #4 Left,Plantar Metatarsal head first: Clean wound with Normal Saline. May Shower, gently pat wound dry prior to applying new dressing. Anesthetic (add to Medication List): Wound #4 Left,Plantar Metatarsal head first: Topical Lidocaine 4% cream applied to wound bed prior to debridement (In Clinic Only). Primary Wound Dressing: Wound #4 Left,Plantar Metatarsal head first: Silver Alginate Secondary Dressing: Wound #4 Left,Plantar Metatarsal head first: Dry Gauze Boardered Foam Dressing - or telfa island with foam for padding Dressing Change Frequency: Wound #4 Left,Plantar Metatarsal head first: Change dressing every other day. Follow-up Appointments: Wound #4 Left,Plantar Metatarsal head first: Return Appointment in 1 week. Additional Orders / Instructions: Wound #4 Left,Plantar Metatarsal head first: Increase protein intake. Activity as tolerated I'm going to recommend at this point that we continue with the Current wound care measures. Patient is in agreement with plan. I'm hopeful that she will continue to show improvement next week as well. Hopefully we get to a point where you may be been able to extend this out for two weeks but right now I do not believe we are there. Please see above for specific wound care orders. We will see patient for re-evaluation in 1 week(s) here in the clinic. If anything worsens or changes patient will contact our office for additional  recommendations. Electronic Signature(s) Signed: 10/15/2017 6:18:22 PM By: Worthy Keeler PA-C Entered By: Worthy Keeler on 10/15/2017 13:53:45 Angela Patterson ([id number]) -------------------------------------------------------------------------------- ROS/PFSH Details Patient Name: Angela Patterson. Date of Service: 10/15/2017 1:00 PM Medical Record Number: [id number] Patient Account Number: [id number] Date of Birth/Sex: 01-10-1921 (84 y.o. F) Treating RN: Montey Hora Primary Care Provider: Einar Pheasant Other Clinician: Referring Provider: Einar Pheasant Treating Provider/Extender: Melburn Hake, HOYT Weeks in Treatment: 5 Information Obtained From Patient Wound History Do you currently have one or more open woundso Yes How many open wounds do you currently haveo 1 Approximately how long have you had your woundso one month open How have you been treating your wound(s) until nowo wound cleanser Has your wound(s) ever healed and then re-openedo Yes Have you had any lab work done in the past montho No Have you tested positive for an antibiotic resistant organism (MRSA, VRE)o No Have you tested positive for osteomyelitis (bone infection)o No Have you had any tests for circulation on your legso No Constitutional Symptoms (General Health) Complaints and Symptoms: No Complaints or Symptoms Eyes Medical History: Positive for: Cataracts - removed "a few years ago" (B ); Glaucoma Ear/Nose/Mouth/Throat Medical History: Negative for: Chronic sinus problems/congestion; Middle ear problems Hematologic/Lymphatic Medical History: Negative for: Anemia; Hemophilia; Human Immunodeficiency Virus; Lymphedema; Sickle Cell Disease Respiratory Complaints and Symptoms: No Complaints or Symptoms Medical History: Negative for: Aspiration; Asthma; Chronic Obstructive Pulmonary Disease (COPD); Pneumothorax; Sleep Apnea; Tuberculosis Cardiovascular Complaints and  Symptoms: No Complaints or  Symptoms Medical History: Positive for: Hypertension Past Medical History NotesMarland Kitchen Angela Patterson, Angela Patterson ([id number]) TIA Gastrointestinal Medical History: Negative for: Cirrhosis ; Crohnos; Hepatitis A; Hepatitis B; Hepatitis C Endocrine Medical History: Positive for: Type II Diabetes Time with diabetes: 2 years Treated with: Oral agents, Diet Blood sugar tested every day: Yes Tested : twice Blood sugar testing results: Bedtime: 115 Genitourinary Medical History: Negative for: End Stage Renal Disease Immunological Medical History: Negative for: Lupus Erythematosus; Raynaudos; Scleroderma Integumentary (Skin) Medical History: Positive for: History of pressure wounds Negative for: History of Burn Musculoskeletal Medical History: Negative for: Gout; Rheumatoid Arthritis; Osteoarthritis; Osteomyelitis Neurologic Medical History: Positive for: Neuropathy Past Medical History Notes: TIA Oncologic Medical History: Negative for: Received Chemotherapy; Received Radiation Psychiatric Complaints and Symptoms: No Complaints or Symptoms HBO Extended History Items Eyes: Eyes: Cataracts Glaucoma Angela Patterson, Angela Patterson ([id number]) Immunizations Pneumococcal Vaccine: Received Pneumococcal Vaccination: Yes Implantable Devices Family and Social History Cancer: Yes - Father; Diabetes: Yes - Siblings; Heart Disease: No; Hereditary Spherocytosis: No; Hypertension: No; Kidney Disease: No; Lung Disease: No; Seizures: No; Stroke: Yes - Mother; Thyroid Problems: No; Tuberculosis: No; Never smoker; Marital Status - Widowed; Alcohol Use: Never; Drug Use: No History; Caffeine Use: Daily; Financial Concerns: No; Food, Clothing or Shelter Needs: No; Support System Lacking: No; Transportation Concerns: No; Advanced Directives: Yes (Not Provided); Patient does not want information on Advanced Directives; Do not resuscitate: No; Living Will: Yes (Copy provided); Medical Power of Attorney: Yes - Olen Cordial-  daughter (Copy provided) Physician Affirmation I have reviewed and agree with the above information. Electronic Signature(s) Signed: 10/15/2017 4:53:17 PM By: Montey Hora Signed: 10/15/2017 6:18:22 PM By: Worthy Keeler PA-C Entered By: Worthy Keeler on 10/15/2017 13:52:53 Angela Patterson ([id number]) -------------------------------------------------------------------------------- SuperBill Details Patient Name: Angela Patterson. Date of Service: 10/15/2017 Medical Record Number: [id number] Patient Account Number: [id number] Date of Birth/Sex: 08-30-1920 (84 y.o. F) Treating RN: Montey Hora Primary Care Provider: Einar Pheasant Other Clinician: Referring Provider: Einar Pheasant Treating Provider/Extender: Melburn Hake, HOYT Weeks in Treatment: 5 Diagnosis Coding ICD-10 Codes Code Description E11.621 Type 2 diabetes mellitus with 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 with fat layer exposed E11.40 Type 2 diabetes mellitus with diabetic neuropathy, unspecified L84 Corns and callosities I10 Essential (primary) hypertension Facility Procedures CPT4 Code: [id number] Description: 17408 - DEB SUBQ TISSUE 20 SQ CM/< ICD-10 Diagnosis Description L97.522 Non-pressure chronic ulcer of other part of left foot with fat Modifier: layer exposed Quantity: 1 Physician Procedures CPT4 Code: 1448185 Description: 63149 - WC PHYS SUBQ TISS 20 SQ CM ICD-10 Diagnosis Description L97.522 Non-pressure chronic ulcer of other part of left foot with fat Modifier: layer exposed Quantity: 1 Electronic Signature(s) Signed: 10/15/2017 6:18:22 PM By: Worthy Keeler PA-C Entered By: Worthy Keeler on 10/15/2017 13:54:06

## 2017-10-22 ENCOUNTER — Encounter: Payer: Medicare Other | Attending: Physician Assistant | Admitting: Physician Assistant

## 2017-10-22 DIAGNOSIS — E1140 Type 2 diabetes mellitus with diabetic neuropathy, unspecified: Secondary | ICD-10-CM | POA: Insufficient documentation

## 2017-10-22 DIAGNOSIS — H409 Unspecified glaucoma: Secondary | ICD-10-CM | POA: Diagnosis not present

## 2017-10-22 DIAGNOSIS — L97522 Non-pressure chronic ulcer of other part of left foot with fat layer exposed: Secondary | ICD-10-CM | POA: Diagnosis not present

## 2017-10-22 DIAGNOSIS — Z8673 Personal history of transient ischemic attack (TIA), and cerebral infarction without residual deficits: Secondary | ICD-10-CM | POA: Diagnosis not present

## 2017-10-22 DIAGNOSIS — L89894 Pressure ulcer of other site, stage 4: Secondary | ICD-10-CM | POA: Diagnosis not present

## 2017-10-22 DIAGNOSIS — L84 Corns and callosities: Secondary | ICD-10-CM | POA: Insufficient documentation

## 2017-10-22 DIAGNOSIS — M85872 Other specified disorders of bone density and structure, left ankle and foot: Secondary | ICD-10-CM | POA: Insufficient documentation

## 2017-10-22 DIAGNOSIS — I10 Essential (primary) hypertension: Secondary | ICD-10-CM | POA: Insufficient documentation

## 2017-10-22 DIAGNOSIS — E11621 Type 2 diabetes mellitus with foot ulcer: Secondary | ICD-10-CM | POA: Diagnosis not present

## 2017-10-22 DIAGNOSIS — X58XXXA Exposure to other specified factors, initial encounter: Secondary | ICD-10-CM | POA: Insufficient documentation

## 2017-10-22 DIAGNOSIS — Z7901 Long term (current) use of anticoagulants: Secondary | ICD-10-CM | POA: Insufficient documentation

## 2017-10-22 DIAGNOSIS — Z823 Family history of stroke: Secondary | ICD-10-CM | POA: Insufficient documentation

## 2017-10-22 DIAGNOSIS — Z7984 Long term (current) use of oral hypoglycemic drugs: Secondary | ICD-10-CM | POA: Diagnosis not present

## 2017-10-22 DIAGNOSIS — S51812A Laceration without foreign body of left forearm, initial encounter: Secondary | ICD-10-CM | POA: Insufficient documentation

## 2017-10-22 NOTE — Progress Notes (Signed)
Angela Patterson ([id number]) Visit Report for 10/15/2017 Arrival Information Details Patient Name: Angela Patterson. Date of Service: 10/15/2017 1:00 PM Medical Record Number: [id number] Patient Account Number: [id number] Date of Birth/Sex: 25-Jun-1921 (84 y.o. F) Treating RN: Roger Shelter Primary Care Angela Patterson: Angela Patterson Other Clinician: Referring Montel Vanderhoof: Angela Patterson Treating Leiya Keesey/Extender: Angela Patterson, Angela Patterson: 5 Visit Information History Since Last Visit All ordered tests and consults were completed: No Patient Arrived: Ambulatory Added or deleted any medications: No Arrival Time: 12:59 Any new allergies or adverse reactions: No Accompanied By: self Had a fall or experienced change in No Transfer Assistance: None activities of daily living that may affect Patient Identification Verified: Yes risk of falls: Secondary Verification Process Completed: Yes Signs or symptoms of abuse/neglect since last visito No Patient Has Alerts: Yes Hospitalized since last visit: No Patient Alerts: DMII Implantable device outside of the clinic excluding No cellular tissue based products placed in the center since last visit: Pain Present Now: No Electronic Signature(s) Signed: 10/15/2017 4:26:13 PM By: Roger Shelter Entered By: Roger Shelter on 10/15/2017 12:59:23 Angela Patterson ([id number]) -------------------------------------------------------------------------------- Encounter Discharge Information Details Patient Name: Angela Patterson. Date of Service: 10/15/2017 1:00 PM Medical Record Number: [id number] Patient Account Number: [id number] Date of Birth/Sex: Dec 27, 1920 (84 y.o. F) Treating RN: Ahmed Prima Primary Care Rivers Hamrick: Angela Patterson Other Clinician: Referring Angela Patterson: Angela Patterson Treating Yuriana Gaal/Extender: Angela Patterson, Angela Patterson: 5 Encounter Discharge Information Items Discharge Pain Level: 0 Discharge Condition:  Stable Ambulatory Status: Ambulatory Discharge Destination: Home Transportation: Private Auto Accompanied By: daughter Schedule Follow-up Appointment: Yes Medication Reconciliation completed and No provided to Patient/Care Laurelin Elson: Provided on Clinical Summary of Care: 10/15/2017 Form Type Recipient Paper Patient EG Electronic Signature(s) Signed: 10/18/2017 10:06:49 AM By: Ruthine Dose Entered By: Ruthine Dose on 10/15/2017 13:55:34 Angela Patterson ([id number]) -------------------------------------------------------------------------------- Lower Extremity Assessment Details Patient Name: Angela Patterson. Date of Service: 10/15/2017 1:00 PM Medical Record Number: [id number] Patient Account Number: [id number] Date of Birth/Sex: April 26, 1921 (84 y.o. F) Treating RN: Roger Shelter Primary Care Verline Kong: Angela Patterson Other Clinician: Referring Honey Zakarian: Angela Patterson Treating Marshall Kampf/Extender: Angela Patterson, Angela Patterson: 5 Edema Assessment Assessed: [Left: No] [Right: No] Edema: [Left: N] [Right: o] Vascular Assessment Claudication: Claudication Assessment [Left:None] Pulses: Dorsalis Pedis Palpable: [Left:Yes] Posterior Tibial Extremity colors, hair growth, and conditions: Extremity Color: [Left:Normal] Hair Growth on Extremity: [Left:Yes] Temperature of Extremity: [Left:Warm] Capillary Refill: [Left:< 3 seconds] Toe Nail Assessment Left: Right: Thick: Yes Discolored: Yes Deformed: No Improper Length and Hygiene: Yes Electronic Signature(s) Signed: 10/15/2017 4:26:13 PM By: Roger Shelter Entered By: Roger Shelter on 10/15/2017 13:07:11 Angela Patterson ([id number]) -------------------------------------------------------------------------------- Multi Wound Chart Details Patient Name: Angela Patterson. Date of Service: 10/15/2017 1:00 PM Medical Record Number: [id number] Patient Account Number: [id number] Date of Birth/Sex: 1920-08-18 (84 y.o.  F) Treating RN: Montey Hora Primary Care Katalea Ucci: Angela Patterson Other Clinician: Referring Ariyan Brisendine: Angela Patterson Treating Johan Creveling/Extender: Angela Patterson, Angela Patterson: 5 Vital Signs Height(in): 65 Pulse(bpm): 65 Weight(lbs): 107 Blood Pressure(mmHg): 148/76 Body Mass Index(BMI): 18 Temperature(F): 97.5 Respiratory Rate 18 (breaths/min): Photos: [4:No Photos] [N/A:N/A] Wound Location: [4:Left Metatarsal head first - Plantar] [N/A:N/A] Wounding Event: [4:Pressure Injury] [N/A:N/A] Primary Etiology: [4:Pressure Ulcer] [N/A:N/A] Comorbid History: [4:Cataracts, Glaucoma, Hypertension, Type II Diabetes, History of pressure wounds, Neuropathy] [N/A:N/A] Date Acquired: [4:08/20/2017] [N/A:N/A] Weeks of Patterson: [4:5] [N/A:N/A] Wound Status: [4:Open] [N/A:N/A] Measurements L x W x D [4:1x0.6x0.2] [N/A:N/A] (cm) Area (cm) : [4:0.471] [N/A:N/A] Volume (cm) : [4:0.094] [  N/A:N/A] % Reduction in Area: [4:73.30%] [N/A:N/A] % Reduction in Volume: [4:89.40%] [N/A:N/A] Starting Position 1 [4:11] (o'clock): Ending Position 1 [4:2] (o'clock): Maximum Distance 1 (cm): [4:0.4] Undermining: [4:Yes] [N/A:N/A] Classification: [4:Category/Stage IV] [N/A:N/A] Exudate Amount: [4:Large] [N/A:N/A] Exudate Type: [4:Serosanguineous] [N/A:N/A] Exudate Color: [4:red, brown] [N/A:N/A] Wound Margin: [4:Indistinct, nonvisible] [N/A:N/A] Granulation Amount: [4:Large (67-100%)] [N/A:N/A] Granulation Quality: [4:Red] [N/A:N/A] Necrotic Amount: [4:Small (1-33%)] [N/A:N/A] Necrotic Tissue: [4:Eschar, Adherent Slough] [N/A:N/A] Exposed Structures: [4:Fascia: Yes Fat Layer (Subcutaneous Tissue) Exposed: Yes Tendon: No] [N/A:N/A] Muscle: No Joint: No Bone: No Epithelialization: None N/A N/A Periwound Skin Texture: Callus: Yes N/A N/A Excoriation: No Induration: No Crepitus: No Rash: No Scarring: No Periwound Skin Moisture: Maceration: Yes N/A N/A Dry/Scaly: Yes Periwound Skin  Color: Atrophie Blanche: No N/A N/A Cyanosis: No Ecchymosis: No Erythema: No Hemosiderin Staining: No Mottled: No Pallor: No Rubor: No Temperature: No Abnormality N/A N/A Tenderness on Palpation: Yes N/A N/A Wound Preparation: Ulcer Cleansing: N/A N/A Rinsed/Irrigated with Saline Topical Anesthetic Applied: Other: lidocaine 4% Patterson Notes Electronic Signature(s) Signed: 10/15/2017 4:53:17 PM By: Montey Hora Entered By: Montey Hora on 10/15/2017 13:36:19 Angela Patterson ([id number]) -------------------------------------------------------------------------------- [place] Details Patient Name: Angela Patterson. Date of Service: 10/15/2017 1:00 PM Medical Record Number: [id number] Patient Account Number: [id number] Date of Birth/Sex: Jul 31, 1920 (84 y.o. F) Treating RN: Montey Hora Primary Care Lleyton Byers: Angela Patterson Other Clinician: Referring Corky Blumstein: Angela Patterson Treating Adylynn Hertenstein/Extender: Angela Patterson, Angela Patterson: 5 Active Inactive ` Abuse / Safety / Falls / Self Care Management Nursing Diagnoses: Potential for falls Goals: Patient will not experience any injury related to falls Date Initiated: 09/10/2017 Target Resolution Date: 11/27/2017 Goal Status: Active Interventions: Assess fall risk on admission and as needed Notes: ` Orientation to the Wound Care Program Nursing Diagnoses: Knowledge deficit related to the wound healing center program Goals: Patient/caregiver will verbalize understanding of the [place] Program Date Initiated: 09/10/2017 Target Resolution Date: 11/27/2017 Goal Status: Active Interventions: Provide education on orientation to the wound center Notes: ` Wound/Skin Impairment Nursing Diagnoses: Impaired tissue integrity Goals: Ulcer/skin breakdown will heal within 14 weeks Date Initiated: 09/10/2017 Target Resolution Date: 11/27/2017 Goal Status: Active Interventions: LINNA, THEBEAU ([id number]) Assess patient/caregiver ability to obtain necessary supplies Assess patient/caregiver ability to perform ulcer/skin care regimen upon admission and as needed Assess ulceration(s) every visit Notes: Electronic Signature(s) Signed: 10/15/2017 4:53:17 PM By: Montey Hora Entered By: Montey Hora on 10/15/2017 13:36:10 Angela Patterson ([id number]) -------------------------------------------------------------------------------- Pain Assessment Details Patient Name: Angela Patterson. Date of Service: 10/15/2017 1:00 PM Medical Record Number: [id number] Patient Account Number: [id number] Date of Birth/Sex: 1920-09-25 (84 y.o. F) Treating RN: Roger Shelter Primary Care Caymen Dubray: Angela Patterson Other Clinician: Referring Judy Pollman: Angela Patterson Treating Malyia Moro/Extender: Angela Patterson, Angela Patterson: 5 Active Problems Location of Pain Severity and Description of Pain Patient Has Paino No Site Locations Pain Management and Medication Current Pain Management: Electronic Signature(s) Signed: 10/15/2017 4:26:13 PM By: Roger Shelter Entered By: Roger Shelter on 10/15/2017 12:59:51 Angela Patterson ([id number]) -------------------------------------------------------------------------------- Patient/Caregiver Education Details Patient Name: Angela Patterson. Date of Service: 10/15/2017 1:00 PM Medical Record Number: [id number] Patient Account Number: [id number] Date of Birth/Gender: 05-25-1921 (83 y.o. F) Treating RN: Ahmed Prima Primary Care Physician: Angela Patterson Other Clinician: Referring Physician: Einar Patterson Treating Physician/Extender: Sharalyn Ink in Patterson: 5 Education Assessment Education Provided To: Patient Education Topics Provided Wound/Skin Impairment: Handouts: Caring for Your Ulcer, Other: change dressing as ordered Methods: Demonstration, Explain/Verbal Responses: State content  correctly Electronic  Signature(s) Signed: 10/21/2017 4:32:30 PM By: Alric Quan Entered By: Alric Quan on 10/15/2017 13:55:19 Angela Patterson ([id number]) -------------------------------------------------------------------------------- Wound Assessment Details Patient Name: Angela Patterson. Date of Service: 10/15/2017 1:00 PM Medical Record Number: [id number] Patient Account Number: [id number] Date of Birth/Sex: 11-04-1920 (84 y.o. F) Treating RN: Roger Shelter Primary Care Mulki Roesler: Angela Patterson Other Clinician: Referring Jedadiah Abdallah: Angela Patterson Treating Ronith Berti/Extender: Angela Patterson, Angela Patterson: 5 Wound Status Wound Number: 4 Primary Pressure Ulcer Etiology: Wound Location: Left Metatarsal head first - Plantar Wound Open Wounding Event: Pressure Injury Status: Date Acquired: 08/20/2017 Comorbid Cataracts, Glaucoma, Hypertension, Type II Weeks Of Patterson: 5 History: Diabetes, History of pressure wounds, Clustered Wound: No Neuropathy Photos Photo Uploaded By: Roger Shelter on 10/15/2017 15:06:03 Wound Measurements Length: (cm) 1 Width: (cm) 0.6 Depth: (cm) 0.2 Area: (cm) 0.471 Volume: (cm) 0.094 % Reduction in Area: 73.3% % Reduction in Volume: 89.4% Epithelialization: None Tunneling: No Undermining: Yes Starting Position (o'clock): 11 Ending Position (o'clock): 2 Maximum Distance: (cm) 0.4 Wound Description Classification: Category/Stage IV Wound Margin: Indistinct, nonvisible Exudate Amount: Large Exudate Type: Serosanguineous Exudate Color: red, brown Foul Odor After Cleansing: No Slough/Fibrino Yes Wound Bed Granulation Amount: Large (67-100%) Exposed Structure Granulation Quality: Red Fascia Exposed: Yes Necrotic Amount: Small (1-33%) Fat Layer (Subcutaneous Tissue) Exposed: Yes Necrotic Quality: Eschar, Adherent Slough Tendon Exposed: No Muscle Exposed: No NITI, LEISURE ([id number]) Joint Exposed: No Bone Exposed: No Periwound Skin  Texture Texture Color No Abnormalities Noted: No No Abnormalities Noted: No Callus: Yes Atrophie Blanche: No Crepitus: No Cyanosis: No Excoriation: No Ecchymosis: No Induration: No Erythema: No Rash: No Hemosiderin Staining: No Scarring: No Mottled: No Pallor: No Moisture Rubor: No No Abnormalities Noted: No Dry / Scaly: Yes Temperature / Pain Maceration: Yes Temperature: No Abnormality Tenderness on Palpation: Yes Wound Preparation Ulcer Cleansing: Rinsed/Irrigated with Saline Topical Anesthetic Applied: Other: lidocaine 4%, Electronic Signature(s) Signed: 10/15/2017 4:26:13 PM By: Roger Shelter Entered By: Roger Shelter on 10/15/2017 13:06:24 Angela Patterson ([id number]) -------------------------------------------------------------------------------- Vitals Details Patient Name: Angela Patterson. Date of Service: 10/15/2017 1:00 PM Medical Record Number: [id number] Patient Account Number: [id number] Date of Birth/Sex: 10-09-1920 (84 y.o. F) Treating RN: Roger Shelter Primary Care Azaan Leask: Angela Patterson Other Clinician: Referring Nakeyia Menden: Angela Patterson Treating Michaeline Eckersley/Extender: Angela Patterson, Angela Patterson: 5 Vital Signs Time Taken: 12:59 Temperature (F): 97.5 Height (in): 65 Pulse (bpm): 65 Weight (lbs): 107 Respiratory Rate (breaths/min): 18 Body Mass Index (BMI): 17.8 Blood Pressure (mmHg): 148/76 Reference Range: 80 - 120 mg / dl Electronic Signature(s) Signed: 10/15/2017 4:26:13 PM By: Roger Shelter Entered By: Roger Shelter on 10/15/2017 13:00:14

## 2017-10-26 NOTE — Progress Notes (Signed)
Angela Patterson, Angela Patterson ([id number]) Visit Report for 10/22/2017 Arrival Information Details Patient Name: [name], MAN. Date of Service: 10/22/2017 1:15 PM Medical Record Number: [id number] Patient Account Number: [id number] Date of Birth/Sex: 1921-05-17 (84 y.o. F) Treating RN: Ahmed Prima Primary Care Lamira Borin: Einar Pheasant Other Clinician: Referring Raidyn Wassink: Einar Pheasant Treating Dmarcus Decicco/Extender: Melburn Hake, HOYT Weeks in Treatment: 6 Visit Information History Since Last Visit All ordered tests and consults were completed: No Patient Arrived: Ambulatory Added or deleted any medications: No Arrival Time: 13:27 Any new allergies or adverse reactions: No Accompanied By: family Had a fall or experienced change in No Transfer Assistance: None activities of daily living that may affect Patient Identification Verified: Yes risk of falls: Secondary Verification Process Completed: Yes Signs or symptoms of abuse/neglect since last visito No Patient Requires Transmission-Based No Hospitalized since last visit: No Precautions: Implantable device outside of the clinic excluding No Patient Has Alerts: Yes cellular tissue based products placed in the center Patient Alerts: DMII since last visit: Has Dressing in Place as Prescribed: Yes Pain Present Now: No Electronic Signature(s) Signed: 10/25/2017 4:32:12 PM By: Alric Quan Entered By: Alric Quan on 10/22/2017 13:29:26 Angela Patterson ([id number]) -------------------------------------------------------------------------------- Encounter Discharge Information Details Patient Name: Angela Patterson. Date of Service: 10/22/2017 1:15 PM Medical Record Number: [id number] Patient Account Number: [id number] Date of Birth/Sex: 1920/10/01 (84 y.o. F) Treating RN: Ahmed Prima Primary Care Ansley Stanwood: Einar Pheasant Other Clinician: Referring Lailany Enoch: Einar Pheasant Treating Solon Alban/Extender: Melburn Hake, HOYT Weeks in Treatment:  6 Encounter Discharge Information Items Discharge Pain Level: 0 Discharge Condition: Stable Ambulatory Status: Ambulatory Discharge Destination: Home Transportation: Private Auto Accompanied By: family Schedule Follow-up Appointment: Yes Medication Reconciliation completed and No provided to Patient/Care Anfernee Peschke: Provided on Clinical Summary of Care: 10/22/2017 Form Type Recipient Paper Patient EG Electronic Signature(s) Signed: 10/25/2017 10:47:07 AM By: Ruthine Dose Entered By: Ruthine Dose on 10/22/2017 14:43:32 Angela Patterson ([id number]) -------------------------------------------------------------------------------- Lower Extremity Assessment Details Patient Name: Angela Patterson. Date of Service: 10/22/2017 1:15 PM Medical Record Number: [id number] Patient Account Number: [id number] Date of Birth/Sex: [birth year] (84 y.o. F) Treating RN: Ahmed Prima Primary Care Renita Brocks: Einar Pheasant Other Clinician: Referring Irania Durell: Einar Pheasant Treating Zakhari Fogel/Extender: Melburn Hake, HOYT Weeks in Treatment: 6 Edema Assessment Assessed: [Left: No] [Right: No] Edema: [Left: N] [Right: o] Vascular Assessment Claudication: Claudication Assessment [Left:None] Pulses: Dorsalis Pedis Palpable: [Left:Yes] Posterior Tibial Extremity colors, hair growth, and conditions: Extremity Color: [Left:Normal] Hair Growth on Extremity: [Left:No] Temperature of Extremity: [Left:Warm] Capillary Refill: [Left:< 3 seconds] Toe Nail Assessment Left: Right: Thick: No Discolored: No Deformed: No Improper Length and Hygiene: No Electronic Signature(s) Signed: 10/25/2017 4:32:12 PM By: Alric Quan Entered By: Alric Quan on 10/22/2017 13:38:58 Angela Patterson ([id number]) -------------------------------------------------------------------------------- Multi Wound Chart Details Patient Name: Angela Patterson. Date of Service: 10/22/2017 1:15 PM Medical Record Number:  [id number] Patient Account Number: [id number] Date of Birth/Sex: [birth year] (84 y.o. F) Treating RN: Montey Hora Primary Care Anique Beckley: Einar Pheasant Other Clinician: Referring Astha Probasco: Einar Pheasant Treating Dineen Conradt/Extender: Melburn Hake, HOYT Weeks in Treatment: 6 Vital Signs Height(in): 65 Pulse(bpm): 67 Weight(lbs): 107 Blood Pressure(mmHg): 154/53 Body Mass Index(BMI): 18 Temperature(F): 97.5 Respiratory Rate 18 (breaths/min): Photos: [4:No Photos] [N/A:N/A] Wound Location: [4:Left Metatarsal head first - Plantar] [N/A:N/A] Wounding Event: [4:Pressure Injury] [N/A:N/A] Primary Etiology: [4:Pressure Ulcer] [N/A:N/A] Comorbid History: [4:Cataracts, Glaucoma, Hypertension, Type II Diabetes, History of pressure wounds, Neuropathy] [N/A:N/A] Date Acquired: [4:08/20/2017] [N/A:N/A] Weeks of Treatment: [4:6] [N/A:N/A] Wound Status: [4:Open] [N/A:N/A] Measurements L x W x D [  4:0.8x0.5x0.2] [N/A:N/A] (cm) Area (cm) : [4:0.314] [N/A:N/A] Volume (cm) : [4:0.063] [N/A:N/A] % Reduction in Area: [4:82.20%] [N/A:N/A] % Reduction in Volume: [4:92.90%] [N/A:N/A] Starting Position 1 [4:11] (o'clock): Ending Position 1 [4:3] (o'clock): Maximum Distance 1 (cm): [4:0.3] Undermining: [4:Yes] [N/A:N/A] Classification: [4:Category/Stage IV] [N/A:N/A] Exudate Amount: [4:Large] [N/A:N/A] Exudate Type: [4:Serosanguineous] [N/A:N/A] Exudate Color: [4:red, brown] [N/A:N/A] Wound Margin: [4:Indistinct, nonvisible] [N/A:N/A] Granulation Amount: [4:Large (67-100%)] [N/A:N/A] Granulation Quality: [4:Red] [N/A:N/A] Necrotic Amount: [4:Small (1-33%)] [N/A:N/A] Necrotic Tissue: [4:Eschar, Adherent Slough] [N/A:N/A] Exposed Structures: [4:Fascia: Yes Fat Layer (Subcutaneous Tissue) Exposed: Yes Tendon: No] [N/A:N/A] Muscle: No Joint: No Bone: No Epithelialization: None N/A N/A Periwound Skin Texture: Callus: Yes N/A N/A Excoriation: No Induration: No Crepitus: No Rash: No Scarring:  No Periwound Skin Moisture: Maceration: Yes N/A N/A Dry/Scaly: Yes Periwound Skin Color: Atrophie Blanche: No N/A N/A Cyanosis: No Ecchymosis: No Erythema: No Hemosiderin Staining: No Mottled: No Pallor: No Rubor: No Temperature: No Abnormality N/A N/A Tenderness on Palpation: Yes N/A N/A Wound Preparation: Ulcer Cleansing: N/A N/A Rinsed/Irrigated with Saline Topical Anesthetic Applied: Other: lidocaine 4% Treatment Notes Electronic Signature(s) Signed: 10/22/2017 3:34:53 PM By: Montey Hora Entered By: Montey Hora on 10/22/2017 14:26:06 Angela Patterson ([id number]) -------------------------------------------------------------------------------- [facility] Details Patient Name: Angela Patterson. Date of Service: 10/22/2017 1:15 PM Medical Record Number: [id number] Patient Account Number: [id number] Date of Birth/Sex: 1920/07/29 (84 y.o. F) Treating RN: Montey Hora Primary Care Kylie Simmonds: Einar Pheasant Other Clinician: Referring Marcques Wrightsman: Einar Pheasant Treating Jaylise Peek/Extender: Melburn Hake, HOYT Weeks in Treatment: 6 Active Inactive ` Abuse / Safety / Falls / Self Care Management Nursing Diagnoses: Potential for falls Goals: Patient will not experience any injury related to falls Date Initiated: 09/10/2017 Target Resolution Date: 11/27/2017 Goal Status: Active Interventions: Assess fall risk on admission and as needed Notes: ` Orientation to the Wound Care Program Nursing Diagnoses: Knowledge deficit related to the wound healing center program Goals: Patient/caregiver will verbalize understanding of the [facility] Program Date Initiated: 09/10/2017 Target Resolution Date: 11/27/2017 Goal Status: Active Interventions: Provide education on orientation to the wound center Notes: ` Wound/Skin Impairment Nursing Diagnoses: Impaired tissue integrity Goals: Ulcer/skin breakdown will heal within 14 weeks Date Initiated:  09/10/2017 Target Resolution Date: 11/27/2017 Goal Status: Active Interventions: PETULA, ROTOLO ([id number]) Assess patient/caregiver ability to obtain necessary supplies Assess patient/caregiver ability to perform ulcer/skin care regimen upon admission and as needed Assess ulceration(s) every visit Notes: Electronic Signature(s) Signed: 10/22/2017 3:34:53 PM By: Montey Hora Entered By: Montey Hora on 10/22/2017 14:25:57 Angela Patterson ([id number]) -------------------------------------------------------------------------------- Pain Assessment Details Patient Name: Angela Patterson. Date of Service: 10/22/2017 1:15 PM Medical Record Number: [id number] Patient Account Number: [id number] Date of Birth/Sex: 28-Sep-1920 (84 y.o. F) Treating RN: Ahmed Prima Primary Care Feliciana Narayan: Einar Pheasant Other Clinician: Referring Amarachukwu Lakatos: Einar Pheasant Treating Daundre Biel/Extender: Melburn Hake, HOYT Weeks in Treatment: 6 Active Problems Location of Pain Severity and Description of Pain Patient Has Paino No Site Locations Pain Management and Medication Current Pain Management: Electronic Signature(s) Signed: 10/25/2017 4:32:12 PM By: Alric Quan Entered By: Alric Quan on 10/22/2017 13:29:33 Angela Patterson ([id number]) -------------------------------------------------------------------------------- Patient/Caregiver Education Details Patient Name: Angela Patterson. Date of Service: 10/22/2017 1:15 PM Medical Record Number: [id number] Patient Account Number: [id number] Date of Birth/Gender: 1921-06-06 (84 y.o. F) Treating RN: Ahmed Prima Primary Care Physician: Einar Pheasant Other Clinician: Referring Physician: Einar Pheasant Treating Physician/Extender: Sharalyn Ink in Treatment: 6 Education Assessment Education Provided To: Patient Education Topics Provided Wound/Skin Impairment: Handouts: Caring for Your  Ulcer, Other: change dressing as ordered Methods:  Demonstration, Explain/Verbal Responses: State content correctly Electronic Signature(s) Signed: 10/25/2017 4:32:12 PM By: Alric Quan Entered By: Alric Quan on 10/22/2017 14:40:57 Angela Patterson ([id number]) -------------------------------------------------------------------------------- Wound Assessment Details Patient Name: Angela Patterson. Date of Service: 10/22/2017 1:15 PM Medical Record Number: [id number] Patient Account Number: [id number] Date of Birth/Sex: June 08, 1921 (84 y.o. F) Treating RN: Ahmed Prima Primary Care Yvette Loveless: Einar Pheasant Other Clinician: Referring Jethro Radke: Einar Pheasant Treating Issac Moure/Extender: Melburn Hake, HOYT Weeks in Treatment: 6 Wound Status Wound Number: 4 Primary Pressure Ulcer Etiology: Wound Location: Left Metatarsal head first - Plantar Wound Open Wounding Event: Pressure Injury Status: Date Acquired: 08/20/2017 Comorbid Cataracts, Glaucoma, Hypertension, Type II Weeks Of Treatment: 6 History: Diabetes, History of pressure wounds, Clustered Wound: No Neuropathy Photos Photo Uploaded By: Roger Shelter on 10/22/2017 16:04:15 Wound Measurements Length: (cm) 0.8 Width: (cm) 0.5 Depth: (cm) 0.2 Area: (cm) 0.314 Volume: (cm) 0.063 % Reduction in Area: 82.2% % Reduction in Volume: 92.9% Epithelialization: None Undermining: Yes Starting Position (o'clock): 11 Ending Position (o'clock): 3 Maximum Distance: (cm) 0.3 Wound Description Classification: Category/Stage IV Wound Margin: Indistinct, nonvisible Exudate Amount: Large Exudate Type: Serosanguineous Exudate Color: red, brown Foul Odor After Cleansing: No Slough/Fibrino Yes Wound Bed Granulation Amount: Large (67-100%) Exposed Structure Granulation Quality: Red Fascia Exposed: Yes Necrotic Amount: Small (1-33%) Fat Layer (Subcutaneous Tissue) Exposed: Yes Necrotic Quality: Eschar, Adherent Slough Tendon Exposed: No Muscle Exposed: No Joint Exposed:  No DERICA, LEIBER ([id number]) Bone Exposed: No Periwound Skin Texture Texture Color No Abnormalities Noted: No No Abnormalities Noted: No Callus: Yes Atrophie Blanche: No Crepitus: No Cyanosis: No Excoriation: No Ecchymosis: No Induration: No Erythema: No Rash: No Hemosiderin Staining: No Scarring: No Mottled: No Pallor: No Moisture Rubor: No No Abnormalities Noted: No Dry / Scaly: Yes Temperature / Pain Maceration: Yes Temperature: No Abnormality Tenderness on Palpation: Yes Wound Preparation Ulcer Cleansing: Rinsed/Irrigated with Saline Topical Anesthetic Applied: Other: lidocaine 4%, Treatment Notes Wound #4 (Left, Plantar Metatarsal head first) 1. Cleansed with: Clean wound with Normal Saline 2. Anesthetic Topical Lidocaine 4% cream to wound bed prior to debridement 3. Peri-wound Care: Skin Prep 4. Dressing Applied: Other dressing (specify in notes) 5. Secondary Dressing Applied [place] Notes [employer] Electronic Signature(s) Signed: 10/25/2017 4:32:12 PM By: Alric Quan Entered By: Alric Quan on 10/22/2017 13:37:22 Angela Patterson ([id number]) -------------------------------------------------------------------------------- Vitals Details Patient Name: Angela Patterson. Date of Service: 10/22/2017 1:15 PM Medical Record Number: [id number] Patient Account Number: [id number] Date of Birth/Sex: 15-May-1921 (84 y.o. F) Treating RN: Ahmed Prima Primary Care Placido Hangartner: Einar Pheasant Other Clinician: Referring Elinore Shults: Einar Pheasant Treating [place] Dalporto/Extender: Melburn Hake, HOYT Weeks in Treatment: 6 Vital Signs Time Taken: 13:29 Temperature (F): 97.5 Height (in): 65 Pulse (bpm): 62 Weight (lbs): 107 Respiratory Rate (breaths/min): 18 Body Mass Index (BMI): 17.8 Blood Pressure (mmHg): 154/53 Reference Range: 80 - 120 mg / dl Electronic Signature(s) Signed: 10/25/2017 4:32:12 PM By: Alric Quan Entered By: Alric Quan on  10/22/2017 13:30:05

## 2017-10-26 NOTE — Progress Notes (Signed)
Angela Patterson, Angela Patterson ([id number]) Visit Report for 10/22/2017 Chief Complaint Document Details Patient Name: Angela Patterson, Angela Patterson. Date of Service: 10/22/2017 1:15 PM Medical Record Number: [id number] Patient Account Number: [id number] Date of Birth/Sex: [birth year] (84 y.o. F) Treating RN: Montey Hora Primary Care Provider: Einar Pheasant Other Clinician: Referring Provider: Einar Pheasant Treating Provider/Extender: Melburn Hake, HOYT Weeks in Treatment: 6 Information Obtained from: Patient Chief Complaint Left plantar foot ulcer Electronic Signature(s) Signed: 10/22/2017 5:57:55 PM By: Worthy Keeler PA-C Entered By: Worthy Keeler on 10/22/2017 14:21:57 Angela Patterson ([id number]) -------------------------------------------------------------------------------- Debridement Details Patient Name: Angela Patterson. Date of Service: 10/22/2017 1:15 PM Medical Record Number: [id number] Patient Account Number: [id number] Date of Birth/Sex: 06-26-1921 (84 y.o. F) Treating RN: Montey Hora Primary Care Provider: Einar Pheasant Other Clinician: Referring Provider: Einar Pheasant Treating Provider/Extender: Melburn Hake, HOYT Weeks in Treatment: 6 Debridement Performed for Wound #4 Left,Plantar Metatarsal head first Assessment: Performed By: Physician STONE III, HOYT E., PA-C Debridement Type: Debridement Pre-procedure Verification/Time Yes - 14:28 Out Taken: Start Time: 14:28 Pain Control: Lidocaine 4% Topical Solution Total Area Debrided (L x W): 0.8 (cm) x 0.5 (cm) = 0.4 (cm) Tissue and other material Viable, Non-Viable, Callus, Slough, Subcutaneous, Slough debrided: Level: Skin/Subcutaneous Tissue Debridement Description: Excisional Instrument: Curette Bleeding: Minimum Hemostasis Achieved: Pressure End Time: 14:35 Procedural Pain: 0 Post Procedural Pain: 0 Response to Treatment: Procedure was tolerated well Post Debridement Measurements of Total Wound Length: (cm) 0.8 Stage:  Category/Stage IV Width: (cm) 0.6 Depth: (cm) 0.3 Volume: (cm) 0.113 Character of Wound/Ulcer Post Improved Debridement: Post Procedure Diagnosis Same as Pre-procedure Electronic Signature(s) Signed: 10/22/2017 3:34:53 PM By: Montey Hora Signed: 10/22/2017 5:57:55 PM By: Worthy Keeler PA-C Entered By: Montey Hora on 10/22/2017 [place], [place]. ([id number]) -------------------------------------------------------------------------------- HPI Details Patient Name: Angela Patterson. Date of Service: 10/22/2017 1:15 PM Medical Record Number: [id number] Patient Account Number: [id number] Date of Birth/Sex: 1920-12-02 (84 y.o. F) Treating RN: Montey Hora Primary Care Provider: Einar Pheasant Other Clinician: Referring Provider: Einar Pheasant Treating Provider/Extender: Melburn Hake, HOYT Weeks in Treatment: 6 History of Present Illness HPI Description: this very pleasant 84 year old patient who is extremely active and ambulating all day has had problems with the left foot for over a year. Her daughter who is her caregiver says she drives around and even goes around and does her chores by herself. this is the third recurrence and after the last time we saw in June and discharged her her daughter has got her to pace of diabetic shoes and she's been wearing these regularly. She has been on the shoes for about 6 months now. Her diabetes is under control and her blood sugars run from a anywhere between 110 to 130 and she checks them twice a day. no fever or discharge. During her initial visit, an x-ray of her left foot was done on 09/26/2014. There was no cortical erosion noted there was mild osteopenia in the distal first metatarsal. Radiologist has recommended a MRI of the left foot to exclude osteomyelitis. MRI done on 10/09/2014 reveals that the area where she had a phlegmon is without osteomyelitis. the bone marrow and other areas are compatible with reactive edema rather  than osteomyelitis. 08/06/2015 -- x-ray of the left foot done on 07/30/2015 shows IMPRESSION:Soft tissue wound with a bandage is noted over the plantar aspect of the distal left foot. Diffuse osteopenia degenerative change. No acute bony abnormality 09/16/2015 -- last week she had gone to [facility]  and met with Harrington Challenger who had spoken to me over the phone. Instead of a DH walking boot he has given her an offloading boot which will make her more steady and this boot is called DH healing sandal with a custom-made insole to offload this area. Readmission: 09/10/17 on evaluation today patient presents for reevaluation in regard to her left plantar foot ulcer has actually been since 2017 since we have last seen her. With that being said she has seem to do fairly well in the interim since that time. Patient is seen with both her daughter as well as her son present for today's evaluation. Her son tells me that they have ordered diabetic shoes for her which obviously are custom-made and that they were hoping these would be available today to bring in. With that being said she does also have some of the boots left that she had previously for offloading and they brought those with them today as well. With that being said up to this point patient has just been using her tennis shoes in regard to her footwear. She has had drainage from the ulcer and it really does not seem to be improving significantly at this time. She does have a history of diabetes mellitus type II which she is on medication and she is also on a blood thinner. Fortunately she does not seem to have any evidence of discomfort due to the diabetic neuropathy. Patient does have a very high arch which is why she seems to get pressure to this area of the first metatarsal on the left foot leading to the ulcerations that develop. The current ulceration is in the exact location of her prior ulceration. She has not had a  recent x-ray. 09/17/17 On evaluation today patient appears to be doing well in regard to her left plantar foot ulcer. Since last week she has had excellent filling in of the wound and this is definitely much smaller than previously noted. With that being said she is having no pain she has neuropathy and therefore does not feel any pain anyway. There's no evidence of infection which is great news. She does have callous but again this is not nearly as significant as previously noted. Overall I'm definitely pleased with how things stand today. 09/24/17 on evaluation today patient's left plantar foot ulcer appears to show signs of good improvement compared even to last week's evaluation. She is slowly healing and again from the first time I saw this this definitely appears to be doing much better. She's not have any discomfort which is great news unfortunately most of this is actually due to the fact that she has neuropathy. Other than that however she has been tolerating the dressing changes without complication she actually performs the dressing changes herself. Angela Patterson, Angela Patterson ([id number]) 10/08/17 on evaluation today patient's foot ulcer on the right actually appears to be doing a little bit worse unfortunately. She has been tolerating the dressing changes fairly well she performs these herself. Although I'm unsure that she has really been packing the alginate into the wound I have discussed this with her before but it sounds as if she may not be. Nonetheless I did have a discussion concerning this again today with the daughter present as well as her son and other daughter whom I just met today. Patient did get her custom shoes which finally arrived which is definitely good news. These do appear to be excellent I did check them out in the offloading as well as  our support is great. 10/15/17 on evaluation today patient appears to be doing little better compared to last week's evaluation. She does have a  little bit of bruising on the lateral border of the wound which seems to be indicative of some pressure occurring at that site. With that being said this is most likely as a result of the dressing slipping apparently with the new shoes this is not been staying in place as well. That's one thing we are going to have to continue to keep an eye on and see if there's any issues in that regard. Nonetheless we may need to go back to the offloading boots until we get this healed and then have her transfer back to her custom shoes depend on how things progress. Nonetheless at this time I do not see any definite or permanent damage and again she does seem to be doing a little bit better in my opinion. 10/22/17 on evaluation today patient actually appears to be doing rather well in regard to her left foot ulcer. The dressing has been staying on better which is good news. Subsequently the patient also has been utilizing her diabetic shoes with good result as well. Overall I think it is helpful for her now that the dressings are staying in place there appears to be no new injury and the wound of the ball is showing signs of improving. Electronic Signature(s) Signed: 10/22/2017 5:57:55 PM By: Worthy Keeler PA-C Entered By: Worthy Keeler on 10/22/2017 17:42:45 Angela Patterson ([id number]) -------------------------------------------------------------------------------- Physical Exam Details Patient Name: Angela Patterson. Date of Service: 10/22/2017 1:15 PM Medical Record Number: [id number] Patient Account Number: [id number] Date of Birth/Sex: 1921/05/10 (84 y.o. F) Treating RN: Montey Hora Primary Care Provider: Einar Pheasant Other Clinician: Referring Provider: Einar Pheasant Treating Provider/Extender: Melburn Hake, HOYT Weeks in Treatment: 6 Constitutional Well-nourished and well-hydrated in no acute distress. Respiratory normal breathing without difficulty. clear to auscultation  bilaterally. Cardiovascular regular rate and rhythm with normal S1, S2. Psychiatric this patient is able to make decisions and demonstrates good insight into disease process. Alert and Oriented x 3. pleasant and cooperative. Notes Patient's wound did have some Slough noted in the wound bed and there was still callous surrounding the wound bed as well. This was sharply debrided today removing both callous as well as subcutaneous tissue and Slough. Post debridement the wound appear to be doing much better. Electronic Signature(s) Signed: 10/22/2017 5:57:55 PM By: Worthy Keeler PA-C Entered By: Worthy Keeler on 10/22/2017 17:44:14 Angela Patterson ([id number]) -------------------------------------------------------------------------------- Physician Orders Details Patient Name: Angela Patterson. Date of Service: 10/22/2017 1:15 PM Medical Record Number: [id number] Patient Account Number: [id number] Date of Birth/Sex: [birth year] (84 y.o. F) Treating RN: Montey Hora Primary Care Provider: Einar Pheasant Other Clinician: Referring Provider: Einar Pheasant Treating Provider/Extender: Melburn Hake, HOYT Weeks in Treatment: 6 Verbal / Phone Orders: No Diagnosis Coding ICD-10 Coding Code Description E11.621 Type 2 diabetes mellitus with 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 with fat layer exposed E11.40 Type 2 diabetes mellitus with diabetic neuropathy, unspecified L84 Corns and callosities I10 Essential (primary) hypertension Wound Cleansing Wound #4 Left,Plantar Metatarsal head first o Clean wound with Normal Saline. o May Shower, gently pat wound dry prior to applying new dressing. Anesthetic (add to Medication List) Wound #4 Left,Plantar Metatarsal head first o Topical Lidocaine 4% cream applied to wound bed prior to debridement (In Clinic Only). Primary Wound Dressing Wound #4 Left,Plantar Metatarsal  head first o Silver Alginate Secondary  Dressing Wound #4 Left,Plantar Metatarsal head first o Dry Gauze o Boardered Foam Dressing - or [place] with foam for padding Dressing Change Frequency Wound #4 Left,Plantar Metatarsal head first o Change dressing every other day. Follow-up Appointments Wound #4 Left,Plantar Metatarsal head first o Return Appointment in 2 weeks. Additional Orders / Instructions Wound #4 Left,Plantar Metatarsal head first o Increase protein intake. o Activity as tolerated Angela Patterson, Angela Patterson ([id number]) Electronic Signature(s) Signed: 10/22/2017 3:34:53 PM By: Montey Hora Signed: 10/22/2017 5:57:55 PM By: Worthy Keeler PA-C Entered By: Montey Hora on 10/22/2017 14:36:32 Angela Patterson ([id number]) -------------------------------------------------------------------------------- Problem List Details Patient Name: Angela Patterson. Date of Service: 10/22/2017 1:15 PM Medical Record Number: [id number] Patient Account Number: [id number] Date of Birth/Sex: 1920-10-10 (84 y.o. F) Treating RN: Montey Hora Primary Care Provider: Einar Pheasant Other Clinician: Referring Provider: Einar Pheasant Treating Provider/Extender: Melburn Hake, HOYT Weeks in Treatment: 6 Active Problems ICD-10 Impacting Encounter Code Description Active Date Wound Healing Diagnosis E11.621 Type 2 diabetes mellitus with foot ulcer 09/10/2017 Yes L97.522 Non-pressure chronic ulcer of other part of left foot with fat 09/10/2017 Yes layer exposed E11.40 Type 2 diabetes mellitus with diabetic neuropathy, 09/10/2017 Yes unspecified L84 Corns and callosities 09/10/2017 Yes I10 Essential (primary) hypertension 09/10/2017 Yes Inactive Problems Resolved Problems Electronic Signature(s) Signed: 10/22/2017 5:57:55 PM By: Worthy Keeler PA-C Entered By: Worthy Keeler on 10/22/2017 14:21:50 Angela Patterson ([id number]) -------------------------------------------------------------------------------- Progress Note  Details Patient Name: Angela Patterson. Date of Service: 10/22/2017 1:15 PM Medical Record Number: [id number] Patient Account Number: [id number] Date of Birth/Sex: [birth year] (84 y.o. F) Treating RN: Montey Hora Primary Care Provider: Einar Pheasant Other Clinician: Referring Provider: Einar Pheasant Treating Provider/Extender: Melburn Hake, HOYT Weeks in Treatment: 6 Subjective Chief Complaint Information obtained from Patient Left plantar foot ulcer History of Present Illness (HPI) this very pleasant 84 year old patient who is extremely active and ambulating all day has had problems with the left foot for over a year. Her daughter who is her caregiver says she drives around and even goes around and does her chores by herself. this is the third recurrence and after the last time we saw in June and discharged her her daughter has got her to pace of diabetic shoes and she's been wearing these regularly. She has been on the shoes for about 6 months now. Her diabetes is under control and her blood sugars run from a anywhere between 110 to 130 and she checks them twice a day. no fever or discharge. During her initial visit, an x-ray of her left foot was done on 09/26/2014. There was no cortical erosion noted there was mild osteopenia in the distal first metatarsal. Radiologist has recommended a MRI of the left foot to exclude osteomyelitis. MRI done on 10/09/2014 reveals that the area where she had a phlegmon is without osteomyelitis. the bone marrow and other areas are compatible with reactive edema rather than osteomyelitis. 08/06/2015 -- x-ray of the left foot done on 07/30/2015 shows IMPRESSION:Soft tissue wound with a bandage is noted over the plantar aspect of the distal left foot. Diffuse osteopenia degenerative change. No acute bony abnormality 09/16/2015 -- last week she had gone to [facility] and met with Harrington Challenger who had spoken to me over the  phone. Instead of a DH walking boot he has given her an offloading boot which will make her more steady and this boot is called DH healing sandal with  a custom-made insole to offload this area. Readmission: 09/10/17 on evaluation today patient presents for reevaluation in regard to her left plantar foot ulcer has actually been since 2017 since we have last seen her. With that being said she has seem to do fairly well in the interim since that time. Patient is seen with both her daughter as well as her son present for today's evaluation. Her son tells me that they have ordered diabetic shoes for her which obviously are custom-made and that they were hoping these would be available today to bring in. With that being said she does also have some of the boots left that she had previously for offloading and they brought those with them today as well. With that being said up to this point patient has just been using her tennis shoes in regard to her footwear. She has had drainage from the ulcer and it really does not seem to be improving significantly at this time. She does have a history of diabetes mellitus type II which she is on medication and she is also on a blood thinner. Fortunately she does not seem to have any evidence of discomfort due to the diabetic neuropathy. Patient does have a very high arch which is why she seems to get pressure to this area of the first metatarsal on the left foot leading to the ulcerations that develop. The current ulceration is in the exact location of her prior ulceration. She has not had a recent x-ray. 09/17/17 On evaluation today patient appears to be doing well in regard to her left plantar foot ulcer. Since last week she has had excellent filling in of the wound and this is definitely much smaller than previously noted. With that being said she is having no pain she has neuropathy and therefore does not feel any pain anyway. There's no evidence of infection which  is great news. She does have callous but again this is not nearly as significant as previously noted. Overall I'm definitely pleased with how things stand today. Angela Patterson, Angela Patterson ([id number]) 09/24/17 on evaluation today patient's left plantar foot ulcer appears to show signs of good improvement compared even to last week's evaluation. She is slowly healing and again from the first time I saw this this definitely appears to be doing much better. She's not have any discomfort which is great news unfortunately most of this is actually due to the fact that she has neuropathy. Other than that however she has been tolerating the dressing changes without complication she actually performs the dressing changes herself. 10/08/17 on evaluation today patient's foot ulcer on the right actually appears to be doing a little bit worse unfortunately. She has been tolerating the dressing changes fairly well she performs these herself. Although I'm unsure that she has really been packing the alginate into the wound I have discussed this with her before but it sounds as if she may not be. Nonetheless I did have a discussion concerning this again today with the daughter present as well as her son and other daughter whom I just met today. Patient did get her custom shoes which finally arrived which is definitely good news. These do appear to be excellent I did check them out in the offloading as well as our support is great. 10/15/17 on evaluation today patient appears to be doing little better compared to last week's evaluation. She does have a little bit of bruising on the lateral border of the wound which seems to be indicative  of some pressure occurring at that site. With that being said this is most likely as a result of the dressing slipping apparently with the new shoes this is not been staying in place as well. That's one thing we are going to have to continue to keep an eye on and see if there's any issues in that  regard. Nonetheless we may need to go back to the offloading boots until we get this healed and then have her transfer back to her custom shoes depend on how things progress. Nonetheless at this time I do not see any definite or permanent damage and again she does seem to be doing a little bit better in my opinion. 10/22/17 on evaluation today patient actually appears to be doing rather well in regard to her left foot ulcer. The dressing has been staying on better which is good news. Subsequently the patient also has been utilizing her diabetic shoes with good result as well. Overall I think it is helpful for her now that the dressings are staying in place there appears to be no new injury and the wound of the ball is showing signs of improving. Patient History Information obtained from Patient. Family History Cancer - Father, Diabetes - Siblings, Stroke - Mother, No family history of Heart Disease, Hereditary Spherocytosis, Hypertension, Kidney Disease, Lung Disease, Seizures, Thyroid Problems, Tuberculosis. Social History Never smoker, Marital Status - Widowed, Alcohol Use - Never, Drug Use - No History, Caffeine Use - Daily. Medical And Surgical History Notes Cardiovascular TIA Neurologic TIA Review of Systems (ROS) Constitutional Symptoms (General Health) Denies complaints or symptoms of Fever, Chills. Respiratory The patient has no complaints or symptoms. Cardiovascular The patient has no complaints or symptoms. Psychiatric The patient has no complaints or symptoms. Angela Patterson, Angela Patterson ([id number]) Objective Constitutional Well-nourished and well-hydrated in no acute distress. Vitals Time Taken: 1:29 PM, Height: 65 in, Weight: 107 lbs, BMI: 17.8, Temperature: 97.5 F, Pulse: 62 bpm, Respiratory Rate: 18 breaths/min, Blood Pressure: 154/53 mmHg. Respiratory normal breathing without difficulty. clear to auscultation bilaterally. Cardiovascular regular rate and rhythm with normal  S1, S2. Psychiatric this patient is able to make decisions and demonstrates good insight into disease process. Alert and Oriented x 3. pleasant and cooperative. General Notes: Patient's wound did have some Slough noted in the wound bed and there was still callous surrounding the wound bed as well. This was sharply debrided today removing both callous as well as subcutaneous tissue and Slough. Post debridement the wound appear to be doing much better. Integumentary (Hair, Skin) Wound #4 status is Open. Original cause of wound was Pressure Injury. The wound is located on the Left,Plantar Metatarsal head first. The wound measures 0.8cm length x 0.5cm width x 0.2cm depth; 0.314cm^2 area and 0.063cm^3 volume. There is Fat Layer (Subcutaneous Tissue) Exposed and fascia exposed. There is undermining starting at 11:00 and ending at 3:00 with a maximum distance of 0.3cm. There is a large amount of serosanguineous drainage noted. The wound margin is indistinct and nonvisible. There is large (67-100%) red granulation within the wound bed. There is a small (1-33%) amount of necrotic tissue within the wound bed including Eschar and Adherent Slough. The periwound skin appearance exhibited: Callus, Dry/Scaly, Maceration. The periwound skin appearance did not exhibit: Crepitus, Excoriation, Induration, Rash, Scarring, Atrophie Blanche, Cyanosis, Ecchymosis, Hemosiderin Staining, Mottled, Pallor, Rubor, Erythema. Periwound temperature was noted as No Abnormality. The periwound has tenderness on palpation. Assessment Active Problems ICD-10 E11.621 - Type 2 diabetes mellitus with foot ulcer  V61.607 - Non-pressure chronic ulcer of other part of left foot with fat layer exposed E11.40 - Type 2 diabetes mellitus with diabetic neuropathy, unspecified L84 - Corns and callosities I10 - Essential (primary) hypertension Procedures Angela Patterson, Angela Patterson. ([id number]) Wound #4 Pre-procedure diagnosis of Wound #4 is a  Pressure Ulcer located on the Left,Plantar Metatarsal head first . There was a Excisional Skin/Subcutaneous Tissue Debridement with a total area of 0.4 sq cm performed by STONE III, HOYT E., PA-C. With the following instrument(s): Curette. to remove Viable and Non-Viable tissue/material Material removed includes Callus, Subcutaneous Tissue, and [place] after achieving pain control using Lidocaine 4% Topical Solution. No specimens were taken. A time out was conducted at 14:28, prior to the start of the procedure. A Minimum amount of bleeding was controlled with Pressure. The procedure was tolerated well with a pain level of 0 throughout and a pain level of 0 following the procedure. Post Debridement Measurements: 0.8cm length x 0.6cm width x 0.3cm depth; 0.113cm^3 volume. Post debridement Stage noted as Category/Stage IV. Character of Wound/Ulcer Post Debridement is improved. Post procedure Diagnosis Wound #4: Same as Pre-Procedure Plan Wound Cleansing: Wound #4 Left,Plantar Metatarsal head first: Clean wound with Normal Saline. May Shower, gently pat wound dry prior to applying new dressing. Anesthetic (add to Medication List): Wound #4 Left,Plantar Metatarsal head first: Topical Lidocaine 4% cream applied to wound bed prior to debridement (In Clinic Only). Primary Wound Dressing: Wound #4 Left,Plantar Metatarsal head first: Silver Alginate Secondary Dressing: Wound #4 Left,Plantar Metatarsal head first: Dry Gauze Boardered Foam Dressing - or telfa island with foam for padding Dressing Change Frequency: Wound #4 Left,Plantar Metatarsal head first: Change dressing every other day. Follow-up Appointments: Wound #4 Left,Plantar Metatarsal head first: Return Appointment in 2 weeks. Additional Orders / Instructions: Wound #4 Left,Plantar Metatarsal head first: Increase protein intake. Activity as tolerated At this point I'm going to suggest that we continue with the Current  wound care measures. I do believe that since the patient has been maintained and remained fairly good as far as the wound appearance is concerned over the past week that we could definitely do a two week follow-up visit and see how things are doing at that time. She is in agreement with that plan. In fact that is what the family was desiring at this time. We will therefore see her back for follow-up at that point. Please see above for specific wound care orders. We will see patient for re-evaluation in 1 week(s) here in the clinic. If anything worsens or changes patient will contact our office for additional recommendations. Electronic Signature(s) Angela Patterson, Angela Patterson ([id number]) Signed: 10/22/2017 5:57:55 PM By: Worthy Keeler PA-C Entered By: Worthy Keeler on 10/22/2017 17:44:27 Angela Patterson ([id number]) -------------------------------------------------------------------------------- ROS/PFSH Details Patient Name: Angela Patterson. Date of Service: 10/22/2017 1:15 PM Medical Record Number: [id number] Patient Account Number: [id number] Date of Birth/Sex: [birth year] (84 y.o. F) Treating RN: Montey Hora Primary Care Provider: Einar Pheasant Other Clinician: Referring Provider: Einar Pheasant Treating Provider/Extender: Melburn Hake, HOYT Weeks in Treatment: 6 Information Obtained From Patient Wound History Do you currently have one or more open woundso Yes How many open wounds do you currently haveo 1 Approximately how long have you had your woundso one month open How have you been treating your wound(s) until nowo wound cleanser Has your wound(s) ever healed and then re-openedo Yes Have you had any lab work done in the past montho No Have you tested positive for an  antibiotic resistant organism (MRSA, VRE)o No Have you tested positive for osteomyelitis (bone infection)o No Have you had any tests for circulation on your legso No Constitutional Symptoms (General Health) Complaints and  Symptoms: Negative for: Fever; Chills Eyes Medical History: Positive for: Cataracts - removed "a few years ago" (B ); Glaucoma Ear/Nose/Mouth/Throat Medical History: Negative for: Chronic sinus problems/congestion; Middle ear problems Hematologic/Lymphatic Medical History: Negative for: Anemia; Hemophilia; Human Immunodeficiency Virus; Lymphedema; Sickle Cell Disease Respiratory Complaints and Symptoms: No Complaints or Symptoms Medical History: Negative for: Aspiration; Asthma; Chronic Obstructive Pulmonary Disease (COPD); Pneumothorax; Sleep Apnea; Tuberculosis Cardiovascular Complaints and Symptoms: No Complaints or Symptoms Medical History: Positive for: Hypertension Past Medical History NotesMarland Kitchen Angela Patterson, Angela Patterson ([id number]) TIA Gastrointestinal Medical History: Negative for: Cirrhosis ; Crohnos; Hepatitis A; Hepatitis B; Hepatitis C Endocrine Medical History: Positive for: Type II Diabetes Time with diabetes: 2 years Treated with: Oral agents, Diet Blood sugar tested every day: Yes Tested : twice Blood sugar testing results: Bedtime: 115 Genitourinary Medical History: Negative for: End Stage Renal Disease Immunological Medical History: Negative for: Lupus Erythematosus; Raynaudos; Scleroderma Integumentary (Skin) Medical History: Positive for: History of pressure wounds Negative for: History of Burn Musculoskeletal Medical History: Negative for: Gout; Rheumatoid Arthritis; Osteoarthritis; Osteomyelitis Neurologic Medical History: Positive for: Neuropathy Past Medical History Notes: TIA Oncologic Medical History: Negative for: Received Chemotherapy; Received Radiation Psychiatric Complaints and Symptoms: No Complaints or Symptoms HBO Extended History Items Eyes: Eyes: Cataracts Glaucoma Angela Patterson, Angela Patterson ([id number]) Immunizations Pneumococcal Vaccine: Received Pneumococcal Vaccination: Yes Implantable Devices Family and Social History Cancer: Yes -  Father; Diabetes: Yes - Siblings; Heart Disease: No; Hereditary Spherocytosis: No; Hypertension: No; Kidney Disease: No; Lung Disease: No; Seizures: No; Stroke: Yes - Mother; Thyroid Problems: No; Tuberculosis: No; Never smoker; Marital Status - Widowed; Alcohol Use: Never; Drug Use: No History; Caffeine Use: Daily; Financial Concerns: No; Food, Clothing or Shelter Needs: No; Support System Lacking: No; Transportation Concerns: No; Advanced Directives: Yes (Not Provided); Patient does not want information on Advanced Directives; Do not resuscitate: No; Living Will: Yes (Copy provided); Medical Power of Attorney: Yes - Olen Cordial- daughter (Copy provided) Physician Affirmation I have reviewed and agree with the above information. Electronic Signature(s) Signed: 10/22/2017 5:57:55 PM By: Worthy Keeler PA-C Signed: 10/25/2017 4:12:39 PM By: Montey Hora Entered By: Worthy Keeler on 10/22/2017 17:43:07 Angela Patterson ([id number]) -------------------------------------------------------------------------------- SuperBill Details Patient Name: Angela Patterson. Date of Service: 10/22/2017 Medical Record Number: [id number] Patient Account Number: [id number] Date of Birth/Sex: 1920/11/22 (84 y.o. F) Treating RN: Montey Hora Primary Care Provider: Einar Pheasant Other Clinician: Referring Provider: Einar Pheasant Treating Provider/Extender: Melburn Hake, HOYT Weeks in Treatment: 6 Diagnosis Coding ICD-10 Codes Code Description E11.621 Type 2 diabetes mellitus with 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 with fat layer exposed E11.40 Type 2 diabetes mellitus with diabetic neuropathy, unspecified L84 Corns and callosities I10 Essential (primary) hypertension Facility Procedures CPT4 Code: [id number] Description: 67544 - DEB SUBQ TISSUE 20 SQ CM/< ICD-10 Diagnosis Description L97.522 Non-pressure chronic ulcer of other part of left foot with fat Modifier: layer  exposed Quantity: 1 Physician Procedures CPT4 Code: 9201007 Description: 12197 - WC PHYS SUBQ TISS 20 SQ CM ICD-10 Diagnosis Description L97.522 Non-pressure chronic ulcer of other part of left foot with fat Modifier: layer exposed Quantity: 1 Electronic Signature(s) Signed: 10/22/2017 5:57:55 PM By: Worthy Keeler PA-C Entered By: Worthy Keeler on 10/22/2017 17:44:41

## 2017-11-09 ENCOUNTER — Encounter: Payer: Medicare Other | Admitting: Physician Assistant

## 2017-11-09 DIAGNOSIS — Z7984 Long term (current) use of oral hypoglycemic drugs: Secondary | ICD-10-CM | POA: Diagnosis not present

## 2017-11-09 DIAGNOSIS — L97522 Non-pressure chronic ulcer of other part of left foot with fat layer exposed: Secondary | ICD-10-CM | POA: Diagnosis not present

## 2017-11-09 DIAGNOSIS — M85872 Other specified disorders of bone density and structure, left ankle and foot: Secondary | ICD-10-CM | POA: Diagnosis not present

## 2017-11-09 DIAGNOSIS — Z823 Family history of stroke: Secondary | ICD-10-CM | POA: Diagnosis not present

## 2017-11-09 DIAGNOSIS — E11621 Type 2 diabetes mellitus with foot ulcer: Secondary | ICD-10-CM | POA: Diagnosis not present

## 2017-11-09 DIAGNOSIS — L84 Corns and callosities: Secondary | ICD-10-CM | POA: Diagnosis not present

## 2017-11-09 DIAGNOSIS — S51812A Laceration without foreign body of left forearm, initial encounter: Secondary | ICD-10-CM | POA: Diagnosis not present

## 2017-11-09 DIAGNOSIS — Z8673 Personal history of transient ischemic attack (TIA), and cerebral infarction without residual deficits: Secondary | ICD-10-CM | POA: Diagnosis not present

## 2017-11-09 DIAGNOSIS — L89894 Pressure ulcer of other site, stage 4: Secondary | ICD-10-CM | POA: Diagnosis not present

## 2017-11-09 DIAGNOSIS — Z7901 Long term (current) use of anticoagulants: Secondary | ICD-10-CM | POA: Diagnosis not present

## 2017-11-09 DIAGNOSIS — I10 Essential (primary) hypertension: Secondary | ICD-10-CM | POA: Diagnosis not present

## 2017-11-09 DIAGNOSIS — E1140 Type 2 diabetes mellitus with diabetic neuropathy, unspecified: Secondary | ICD-10-CM | POA: Diagnosis not present

## 2017-11-09 DIAGNOSIS — H409 Unspecified glaucoma: Secondary | ICD-10-CM | POA: Diagnosis not present

## 2017-11-16 NOTE — Progress Notes (Signed)
Angela Patterson ([id number]) Visit Report for 11/09/2017 Chief Complaint Document Details Patient Name: Angela Patterson. Date of Service: 11/09/2017 9:30 AM Medical Record Number: [id number] Patient Account Number: [id number] Date of Birth/Sex: [birth year] (84 y.o. F) Treating RN: Ahmed Prima Primary Care Provider: Einar Pheasant Other Clinician: Referring Provider: Einar Pheasant Treating Provider/Extender: Melburn Hake, HOYT Weeks in Treatment: 8 Information Obtained from: Patient Chief Complaint Left plantar foot ulcer Electronic Signature(s) Signed: 11/09/2017 8:59:01 PM By: Worthy Keeler PA-C Entered By: Worthy Keeler on 11/09/2017 09:27:04 Angela Patterson ([id number]) -------------------------------------------------------------------------------- Debridement Details Patient Name: Angela Patterson. Date of Service: 11/09/2017 9:30 AM Medical Record Number: [id number] Patient Account Number: [id number] Date of Birth/Sex: Mar 13, 1921 (84 y.o. F) Treating RN: Ahmed Prima Primary Care Provider: Einar Pheasant Other Clinician: Referring Provider: Einar Pheasant Treating Provider/Extender: Melburn Hake, HOYT Weeks in Treatment: 8 Debridement Performed for Wound #4 Left,Plantar Metatarsal head first Assessment: Performed By: Physician STONE III, HOYT E., PA-C Debridement Type: Debridement Pre-procedure Verification/Time Yes - 10:23 Out Taken: Start Time: 10:23 Pain Control: Lidocaine 4% Topical Solution Total Area Debrided (L x W): 0.4 (cm) x 0.5 (cm) = 0.2 (cm) Tissue and other material Callus, Slough, Subcutaneous, Slough debrided: Level: Skin/Subcutaneous Tissue Debridement Description: Excisional Instrument: Curette Bleeding: Minimum Hemostasis Achieved: Pressure End Time: 10:31 Procedural Pain: 0 Post Procedural Pain: 0 Response to Treatment: Procedure was tolerated well Post Debridement Measurements of Total Wound Length: (cm) 0.5 Stage: Category/Stage  IV Width: (cm) 0.6 Depth: (cm) 0.3 Volume: (cm) 0.071 Character of Wound/Ulcer Post Requires Further Debridement Debridement: Post Procedure Diagnosis Same as Pre-procedure Electronic Signature(s) Signed: 11/09/2017 8:59:01 PM By: Worthy Keeler PA-C Signed: 11/12/2017 4:29:16 PM By: Alric Quan Entered By: Alric Quan on 11/09/2017 10:32:58 Angela Patterson ([id number]) -------------------------------------------------------------------------------- HPI Details Patient Name: Angela Patterson. Date of Service: 11/09/2017 9:30 AM Medical Record Number: [id number] Patient Account Number: [id number] Date of Birth/Sex: 12-08-1920 (84 y.o. F) Treating RN: Ahmed Prima Primary Care Provider: Einar Pheasant Other Clinician: Referring Provider: Einar Pheasant Treating Provider/Extender: Melburn Hake, HOYT Weeks in Treatment: 8 History of Present Illness HPI Description: this very pleasant 84 year old patient who is extremely active and ambulating all day has had problems with the left foot for over a year. Her daughter who is her caregiver says she drives around and even goes around and does her chores by herself. this is the third recurrence and after the last time we saw in June and discharged her her daughter has got her to pace of diabetic shoes and she's been wearing these regularly. She has been on the shoes for about 6 months now. Her diabetes is under control and her blood sugars run from a anywhere between 110 to 130 and she checks them twice a day. no fever or discharge. During her initial visit, an x-ray of her left foot was done on 09/26/2014. There was no cortical erosion noted there was mild osteopenia in the distal first metatarsal. Radiologist has recommended a MRI of the left foot to exclude osteomyelitis. MRI done on 10/09/2014 reveals that the area where she had a phlegmon is without osteomyelitis. the bone marrow and other areas are compatible with reactive  edema rather than osteomyelitis. 08/06/2015 -- x-ray of the left foot done on 07/30/2015 shows IMPRESSION:Soft tissue wound with a bandage is noted over the plantar aspect of the distal left foot. Diffuse osteopenia degenerative change. No acute bony abnormality 09/16/2015 -- last week she had gone to [facility]  and met with Harrington Challenger who had spoken to me over the phone. Instead of a DH walking boot he has given her an offloading boot which will make her more steady and this boot is called DH healing sandal with a custom-made insole to offload this area. Readmission: 09/10/17 on evaluation today patient presents for reevaluation in regard to her left plantar foot ulcer has actually been since 2017 since we have last seen her. With that being said she has seem to do fairly well in the interim since that time. Patient is seen with both her daughter as well as her son present for today's evaluation. Her son tells me that they have ordered diabetic shoes for her which obviously are custom-made and that they were hoping these would be available today to bring in. With that being said she does also have some of the boots left that she had previously for offloading and they brought those with them today as well. With that being said up to this point patient has just been using her tennis shoes in regard to her footwear. She has had drainage from the ulcer and it really does not seem to be improving significantly at this time. She does have a history of diabetes mellitus type II which she is on medication and she is also on a blood thinner. Fortunately she does not seem to have any evidence of discomfort due to the diabetic neuropathy. Patient does have a very high arch which is why she seems to get pressure to this area of the first metatarsal on the left foot leading to the ulcerations that develop. The current ulceration is in the exact location of her prior ulceration. She  has not had a recent x-ray. 09/17/17 On evaluation today patient appears to be doing well in regard to her left plantar foot ulcer. Since last week she has had excellent filling in of the wound and this is definitely much smaller than previously noted. With that being said she is having no pain she has neuropathy and therefore does not feel any pain anyway. There's no evidence of infection which is great news. She does have callous but again this is not nearly as significant as previously noted. Overall I'm definitely pleased with how things stand today. 09/24/17 on evaluation today patient's left plantar foot ulcer appears to show signs of good improvement compared even to last week's evaluation. She is slowly healing and again from the first time I saw this this definitely appears to be doing much better. She's not have any discomfort which is great news unfortunately most of this is actually due to the fact that she has neuropathy. Other than that however she has been tolerating the dressing changes without complication she actually performs the dressing changes herself. Angela Patterson, Angela Patterson ([id number]) 10/08/17 on evaluation today patient's foot ulcer on the right actually appears to be doing a little bit worse unfortunately. She has been tolerating the dressing changes fairly well she performs these herself. Although I'm unsure that she has really been packing the alginate into the wound I have discussed this with her before but it sounds as if she may not be. Nonetheless I did have a discussion concerning this again today with the daughter present as well as her son and other daughter whom I just met today. Patient did get her custom shoes which finally arrived which is definitely good news. These do appear to be excellent I did check them out in the offloading as well as  our support is great. 10/15/17 on evaluation today patient appears to be doing little better compared to last week's evaluation. She  does have a little bit of bruising on the lateral border of the wound which seems to be indicative of some pressure occurring at that site. With that being said this is most likely as a result of the dressing slipping apparently with the new shoes this is not been staying in place as well. That's one thing we are going to have to continue to keep an eye on and see if there's any issues in that regard. Nonetheless we may need to go back to the offloading boots until we get this healed and then have her transfer back to her custom shoes depend on how things progress. Nonetheless at this time I do not see any definite or permanent damage and again she does seem to be doing a little bit better in my opinion. 10/22/17 on evaluation today patient actually appears to be doing rather well in regard to her left foot ulcer. The dressing has been staying on better which is good news. Subsequently the patient also has been utilizing her diabetic shoes with good result as well. Overall I think it is helpful for her now that the dressings are staying in place there appears to be no new injury and the wound of the ball is showing signs of improving. 11/09/17 on evaluation today patient appears to be doing better in regard to her left plantar foot ulcer. She has much less callous buildup this week compared to last time that I evaluated her two weeks prior. Overall I feel like this is definitely showing signs of improvement which is good news. There does not appear to be any new injury to the foot which is also excellent news. Overall I feel like that her shoes for offloading are doing in great job. Overall I'm pleased with the progress. She does have an area on her left forearm which has previously been evaluated at the walk-in urgent care. She states that it seems to have done pretty well but her son would like for [place] to evaluate this further and ensure there is nothing that appears to be infected or otherwise  causing her trouble. Electronic Signature(s) Signed: 11/09/2017 8:59:01 PM By: Worthy Keeler PA-C Entered By: Worthy Keeler on 11/09/2017 10:34:43 Angela Patterson ([id number]) -------------------------------------------------------------------------------- Physical Exam Details Patient Name: Angela Patterson. Date of Service: 11/09/2017 9:30 AM Medical Record Number: [id number] Patient Account Number: [id number] Date of Birth/Sex: 09/27/1920 (84 y.o. F) Treating RN: Ahmed Prima Primary Care Provider: Einar Pheasant Other Clinician: Referring Provider: Einar Pheasant Treating Provider/Extender: Melburn Hake, HOYT Weeks in Treatment: 8 Constitutional Well-nourished and well-hydrated in no acute distress. Respiratory normal breathing without difficulty. clear to auscultation bilaterally. Cardiovascular regular rate and rhythm with normal S1, S2. Psychiatric this patient is able to make decisions and demonstrates good insight into disease process. Alert and Oriented x 3. pleasant and cooperative. Notes Patient's wound did required debridement in regard to the plantar foot ulcer. I did debride away callous from the surrounding wound as well as slough and subcutaneous tissue from the central portion of the wound. She tolerated this well today without complication and post debridement the wound appear to be doing much better. Her left forearm skin tear did not require any debridement at this point she does seem to have a little bit of hyper granular tissue but she actually has fairly good epithelialization over this and in  fact it's almost completely close. I see no evidence of infection and I think that they can continue with their current measures at this point. Electronic Signature(s) Signed: 11/09/2017 8:59:01 PM By: Worthy Keeler PA-C Entered By: Worthy Keeler on 11/09/2017 10:35:33 Angela Patterson  ([id number]) -------------------------------------------------------------------------------- Physician Orders Details Patient Name: Angela Patterson. Date of Service: 11/09/2017 9:30 AM Medical Record Number: [id number] Patient Account Number: [id number] Date of Birth/Sex: 20-Feb-1921 (84 y.o. F) Treating RN: Ahmed Prima Primary Care Provider: Einar Pheasant Other Clinician: Referring Provider: Einar Pheasant Treating Provider/Extender: Melburn Hake, HOYT Weeks in Treatment: 8 Verbal / Phone Orders: Yes Clinician: Carolyne Fiscal, Debi Read Back and Verified: Yes Diagnosis Coding ICD-10 Coding Code Description E11.621 Type 2 diabetes mellitus with 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 with fat layer exposed E11.40 Type 2 diabetes mellitus with diabetic neuropathy, unspecified L84 Corns and callosities I10 Essential (primary) hypertension Wound Cleansing Wound #4 Left,Plantar Metatarsal head first o Clean wound with Normal Saline. o Cleanse wound with mild soap and water o May Shower, gently pat wound dry prior to applying new dressing. Wound #5 Left Forearm o Clean wound with Normal Saline. o Cleanse wound with mild soap and water o May Shower, gently pat wound dry prior to applying new dressing. Anesthetic (add to Medication List) Wound #4 Left,Plantar Metatarsal head first o Topical Lidocaine 4% cream applied to wound bed prior to debridement (In Clinic Only). Wound #5 Left Forearm o Topical Lidocaine 4% cream applied to wound bed prior to debridement (In Clinic Only). Skin Barriers/Peri-Wound Care Wound #4 Left,Plantar Metatarsal head first o Skin Prep Wound #5 Left Forearm o Skin Prep Primary Wound Dressing Wound #4 Left,Plantar Metatarsal head first o Silver Alginate Wound #5 Left Forearm o Other: - Bacitracin Secondary Dressing Wound #4 Left,Plantar Metatarsal head first CORDIE, BEAZLEY ([id number]) o Boardered  Foam Dressing - or Stokes with foam for padding o Foam Wound #5 Left Forearm o Boardered Foam Dressing Dressing Change Frequency Wound #4 Left,Plantar Metatarsal head first o Change dressing every other day. Wound #5 Left Forearm o Change dressing every day. Follow-up Appointments Wound #4 Left,Plantar Metatarsal head first o Return Appointment in 2 weeks. Wound #5 Left Forearm o Return Appointment in 2 weeks. Additional Orders / Instructions Wound #4 Left,Plantar Metatarsal head first o Increase protein intake. o Activity as tolerated Wound #5 Left Forearm o Increase protein intake. o Activity as tolerated Patient Medications Allergies: Iodinated Contrast Media - IV Dye Notifications Medication Indication Start End lidocaine DOSE 1 - topical 4 % cream - 1 cream topical Electronic Signature(s) Signed: 11/09/2017 8:59:01 PM By: Worthy Keeler PA-C Signed: 11/12/2017 4:29:16 PM By: Alric Quan Entered By: Alric Quan on 11/09/2017 10:33:35 Angela Patterson ([id number]) -------------------------------------------------------------------------------- Prescription 11/09/2017 Patient Name: Angela Patterson. Provider: Worthy Keeler PA-C Date of Birth: April 27, 1921 NPI#: [id number] Sex: F DEA#: [id number] Phone #: [phone] License #: Patient Address: [facility] [place], [place] [address] [address], [place], [place] [address] [phone] Allergies Iodinated Contrast Media - IV Dye Medication Medication: Route: Strength: Form: lidocaine 4 % topical cream topical 4% cream Class: TOPICAL LOCAL ANESTHETICS Dose: Frequency / Time: Indication: 1 1 cream topical Number of Refills: Number of Units: 0 Generic Substitution: Start Date: End Date: One Time Use: Substitution Permitted No Note to Pharmacy: Signature(s): Date(s): Electronic  Signature(s) Signed: 11/09/2017 8:59:01 PM By: Melburn Hake,  Hoyt PA-C Signed: 11/12/2017 4:29:16 PM By: Alric Quan Entered By: Alric Quan on 11/09/2017 10:33:36 Angela Patterson ([id number]) --------------------------------------------------------------------------------  Problem List Details Patient Name: Angela Patterson. Date of Service: 11/09/2017 9:30 AM Medical Record Number: [id number] Patient Account Number: [id number] Date of Birth/Sex: August 31, 1920 (84 y.o. F) Treating RN: Ahmed Prima Primary Care Provider: Einar Pheasant Other Clinician: Referring Provider: Einar Pheasant Treating Provider/Extender: Melburn Hake, HOYT Weeks in Treatment: 8 Active Problems ICD-10 Impacting Encounter Code Description Active Date Wound Healing Diagnosis E11.621 Type 2 diabetes mellitus with foot ulcer 09/10/2017 Yes E11.40 Type 2 diabetes mellitus with diabetic neuropathy, 09/10/2017 Yes unspecified L97.522 Non-pressure chronic ulcer of other part of left foot with fat 09/10/2017 Yes layer exposed S51.802A Unspecified open wound of left forearm, initial encounter 11/09/2017 Yes L84 Corns and callosities 09/10/2017 Yes I10 Essential (primary) hypertension 09/10/2017 Yes Inactive Problems Resolved Problems Electronic Signature(s) Signed: 11/09/2017 8:59:01 PM By: Worthy Keeler PA-C Entered By: Worthy Keeler on 11/09/2017 10:37:03 Angela Patterson ([id number]) -------------------------------------------------------------------------------- Progress Note Details Patient Name: Angela Patterson. Date of Service: 11/09/2017 9:30 AM Medical Record Number: [id number] Patient Account Number: [id number] Date of Birth/Sex: 03/30/1921 (84 y.o. F) Treating RN: Ahmed Prima Primary Care Provider: Einar Pheasant Other Clinician: Referring Provider: Einar Pheasant Treating Provider/Extender: Melburn Hake, HOYT Weeks in Treatment: 8 Subjective Chief Complaint Information obtained from Patient Left  plantar foot ulcer History of Present Illness (HPI) this very pleasant 84 year old patient who is extremely active and ambulating all day has had problems with the left foot for over a year. Her daughter who is her caregiver says she drives around and even goes around and does her chores by herself. this is the third recurrence and after the last time we saw in June and discharged her her daughter has got her to pace of diabetic shoes and she's been wearing these regularly. She has been on the shoes for about 6 months now. Her diabetes is under control and her blood sugars run from a anywhere between 110 to 130 and she checks them twice a day. no fever or discharge. During her initial visit, an x-ray of her left foot was done on 09/26/2014. There was no cortical erosion noted there was mild osteopenia in the distal first metatarsal. Radiologist has recommended a MRI of the left foot to exclude osteomyelitis. MRI done on 10/09/2014 reveals that the area where she had a phlegmon is without osteomyelitis. the bone marrow and other areas are compatible with reactive edema rather than osteomyelitis. 08/06/2015 -- x-ray of the left foot done on 07/30/2015 shows IMPRESSION:Soft tissue wound with a bandage is noted over the plantar aspect of the distal left foot. Diffuse osteopenia degenerative change. No acute bony abnormality 09/16/2015 -- last week she had gone to [facility] and met with Harrington Challenger who had spoken to me over the phone. Instead of a DH walking boot he has given her an offloading boot which will make her more steady and this boot is called DH healing sandal with a custom-made insole to offload this area. Readmission: 09/10/17 on evaluation today patient presents for reevaluation in regard to her left plantar foot ulcer has actually been since 2017 since we have last seen her. With that being said she has seem to do fairly well in the interim since that time.  Patient is seen with both her daughter as well as her son present for today's evaluation. Her son tells me that they have ordered diabetic  shoes for her which obviously are custom-made and that they were hoping these would be available today to bring in. With that being said she does also have some of the boots left that she had previously for offloading and they brought those with them today as well. With that being said up to this point patient has just been using her tennis shoes in regard to her footwear. She has had drainage from the ulcer and it really does not seem to be improving significantly at this time. She does have a history of diabetes mellitus type II which she is on medication and she is also on a blood thinner. Fortunately she does not seem to have any evidence of discomfort due to the diabetic neuropathy. Patient does have a very high arch which is why she seems to get pressure to this area of the first metatarsal on the left foot leading to the ulcerations that develop. The current ulceration is in the exact location of her prior ulceration. She has not had a recent x-ray. 09/17/17 On evaluation today patient appears to be doing well in regard to her left plantar foot ulcer. Since last week she has had excellent filling in of the wound and this is definitely much smaller than previously noted. With that being said she is having no pain she has neuropathy and therefore does not feel any pain anyway. There's no evidence of infection which is great news. She does have callous but again this is not nearly as significant as previously noted. Overall I'm definitely pleased with how things stand today. Angela Patterson, Angela Patterson ([id number]) 09/24/17 on evaluation today patient's left plantar foot ulcer appears to show signs of good improvement compared even to last week's evaluation. She is slowly healing and again from the first time I saw this this definitely appears to be doing much better. She's  not have any discomfort which is great news unfortunately most of this is actually due to the fact that she has neuropathy. Other than that however she has been tolerating the dressing changes without complication she actually performs the dressing changes herself. 10/08/17 on evaluation today patient's foot ulcer on the right actually appears to be doing a little bit worse unfortunately. She has been tolerating the dressing changes fairly well she performs these herself. Although I'm unsure that she has really been packing the alginate into the wound I have discussed this with her before but it sounds as if she may not be. Nonetheless I did have a discussion concerning this again today with the daughter present as well as her son and other daughter whom I just met today. Patient did get her custom shoes which finally arrived which is definitely good news. These do appear to be excellent I did check them out in the offloading as well as our support is great. 10/15/17 on evaluation today patient appears to be doing little better compared to last week's evaluation. She does have a little bit of bruising on the lateral border of the wound which seems to be indicative of some pressure occurring at that site. With that being said this is most likely as a result of the dressing slipping apparently with the new shoes this is not been staying in place as well. That's one thing we are going to have to continue to keep an eye on and see if there's any issues in that regard. Nonetheless we may need to go back to the offloading boots until we get this healed and  then have her transfer back to her custom shoes depend on how things progress. Nonetheless at this time I do not see any definite or permanent damage and again she does seem to be doing a little bit better in my opinion. 10/22/17 on evaluation today patient actually appears to be doing rather well in regard to her left foot ulcer. The dressing has been  staying on better which is good news. Subsequently the patient also has been utilizing her diabetic shoes with good result as well. Overall I think it is helpful for her now that the dressings are staying in place there appears to be no new injury and the wound of the ball is showing signs of improving. 11/09/17 on evaluation today patient appears to be doing better in regard to her left plantar foot ulcer. She has much less callous buildup this week compared to last time that I evaluated her two weeks prior. Overall I feel like this is definitely showing signs of improvement which is good news. There does not appear to be any new injury to the foot which is also excellent news. Overall I feel like that her shoes for offloading are doing in great job. Overall I'm pleased with the progress. She does have an area on her left forearm which has previously been evaluated at the walk-in urgent care. She states that it seems to have done pretty well but her son would like for [place] to evaluate this further and ensure there is nothing that appears to be infected or otherwise causing her trouble. Patient History Information obtained from Patient. Family History Cancer - Father, Diabetes - Siblings, Stroke - Mother, No family history of Heart Disease, Hereditary Spherocytosis, Hypertension, Kidney Disease, Lung Disease, Seizures, Thyroid Problems, Tuberculosis. Social History Never smoker, Marital Status - Widowed, Alcohol Use - Never, Drug Use - No History, Caffeine Use - Daily. Medical And Surgical History Notes Cardiovascular TIA Neurologic TIA Review of Systems (ROS) Constitutional Symptoms (General Health) Denies complaints or symptoms of Fever, Chills. Respiratory The patient has no complaints or symptoms. Cardiovascular The patient has no complaints or symptoms. Angela Patterson, Angela Patterson ([id number]) Psychiatric The patient has no complaints or symptoms. Objective Constitutional Well-nourished and  well-hydrated in no acute distress. Vitals Time Taken: 9:45 AM, Height: 65 in, Weight: 107 lbs, BMI: 17.8, Temperature: 97.8 F, Pulse: 77 bpm, Respiratory Rate: 18 breaths/min, Blood Pressure: 185/69 mmHg. Respiratory normal breathing without difficulty. clear to auscultation bilaterally. Cardiovascular regular rate and rhythm with normal S1, S2. Psychiatric this patient is able to make decisions and demonstrates good insight into disease process. Alert and Oriented x 3. pleasant and cooperative. General Notes: Patient's wound did required debridement in regard to the plantar foot ulcer. I did debride away callous from the surrounding wound as well as slough and subcutaneous tissue from the central portion of the wound. She tolerated this well today without complication and post debridement the wound appear to be doing much better. Her left forearm skin tear did not require any debridement at this point she does seem to have a little bit of hyper granular tissue but she actually has fairly good epithelialization over this and in fact it's almost completely close. I see no evidence of infection and I think that they can continue with their current measures at this point. Integumentary (Hair, Skin) Wound #4 status is Open. Original cause of wound was Pressure Injury. The wound is located on the Left,Plantar Metatarsal head first. The wound measures 0.4cm length x 0.5cm width  x 0.3cm depth; 0.157cm^2 area and 0.047cm^3 volume. There is Fat Layer (Subcutaneous Tissue) Exposed and fascia exposed. There is undermining starting at 7:00 and ending at 2:00 with a maximum distance of 0.4cm. There is a large amount of serosanguineous drainage noted. The wound margin is indistinct and nonvisible. There is medium (34-66%) red granulation within the wound bed. There is a medium (34-66%) amount of necrotic tissue within the wound bed including Eschar and Adherent Slough. The periwound skin  appearance exhibited: Callus, Dry/Scaly. The periwound skin appearance did not exhibit: Crepitus, Excoriation, Induration, Rash, Scarring, Maceration, Atrophie Blanche, Cyanosis, Ecchymosis, Hemosiderin Staining, Mottled, Pallor, Rubor, Erythema. Periwound temperature was noted as No Abnormality. The periwound has tenderness on palpation. Wound #5 status is Open. Original cause of wound was Trauma. The wound is located on the Left Forearm. The wound measures 2.2cm length x 0.5cm width x 0.1cm depth; 0.864cm^2 area and 0.086cm^3 volume. There is no tunneling or undermining noted. There is a medium amount of serous drainage noted. The wound margin is flat and intact. There is large (67-100%) red granulation within the wound bed. There is a small (1-33%) amount of necrotic tissue within the wound bed including Adherent Slough. The periwound skin appearance exhibited: Maceration. The periwound skin appearance did not exhibit: Callus, Crepitus, Excoriation, Induration, Rash, Scarring, Dry/Scaly, Atrophie Blanche, Cyanosis, Ecchymosis, Hemosiderin Staining, Mottled, Pallor, Rubor, Erythema. Periwound temperature was noted as No Abnormality. The periwound has tenderness on palpation. Angela Patterson, Angela Patterson ([id number]) Assessment Active Problems ICD-10 E11.621 - Type 2 diabetes mellitus with foot ulcer E11.40 - Type 2 diabetes mellitus with diabetic neuropathy, unspecified L97.522 - Non-pressure chronic ulcer of other part of left foot with fat layer exposed S51.802A - Unspecified open wound of left forearm, initial encounter L84 - Corns and callosities I10 - Essential (primary) hypertension Procedures Wound #4 Pre-procedure diagnosis of Wound #4 is a Pressure Ulcer located on the Left,Plantar Metatarsal head first . There was a Excisional Skin/Subcutaneous Tissue Debridement with a total area of 0.2 sq cm performed by STONE III, HOYT E., PA-C. With the following instrument(s): Curette. Material removed  includes Callus, Subcutaneous Tissue, and [place] after achieving pain control using Lidocaine 4% Topical Solution. No specimens were taken. A time out was conducted at 10:23, prior to the start of the procedure. A Minimum amount of bleeding was controlled with Pressure. The procedure was tolerated well with a pain level of 0 throughout and a pain level of 0 following the procedure. Post Debridement Measurements: 0.5cm length x 0.6cm width x 0.3cm depth; 0.071cm^3 volume. Post debridement Stage noted as Category/Stage IV. Character of Wound/Ulcer Post Debridement requires further debridement. Post procedure Diagnosis Wound #4: Same as Pre-Procedure Plan Wound Cleansing: Wound #4 Left,Plantar Metatarsal head first: Clean wound with Normal Saline. Cleanse wound with mild soap and water May Shower, gently pat wound dry prior to applying new dressing. Wound #5 Left Forearm: Clean wound with Normal Saline. Cleanse wound with mild soap and water May Shower, gently pat wound dry prior to applying new dressing. Anesthetic (add to Medication List): Wound #4 Left,Plantar Metatarsal head first: Topical Lidocaine 4% cream applied to wound bed prior to debridement (In Clinic Only). Wound #5 Left Forearm: Topical Lidocaine 4% cream applied to wound bed prior to debridement (In Clinic Only). Skin Barriers/Peri-Wound Care: Wound #4 Left,Plantar Metatarsal head first: Skin Prep Wound #5 Left Forearm: Angela Patterson, Angela Patterson ([id number]) Skin Prep Primary Wound Dressing: Wound #4 Left,Plantar Metatarsal head first: Silver Alginate Wound #5 Left Forearm:  Other: - Bacitracin Secondary Dressing: Wound #4 Left,Plantar Metatarsal head first: Boardered Foam Dressing - or [place] with foam for padding Foam Wound #5 Left Forearm: Boardered Foam Dressing Dressing Change Frequency: Wound #4 Left,Plantar Metatarsal head first: Change dressing every other day. Wound #5 Left Forearm: Change  dressing every day. Follow-up Appointments: Wound #4 Left,Plantar Metatarsal head first: Return Appointment in 2 weeks. Wound #5 Left Forearm: Return Appointment in 2 weeks. Additional Orders / Instructions: Wound #4 Left,Plantar Metatarsal head first: Increase protein intake. Activity as tolerated Wound #5 Left Forearm: Increase protein intake. Activity as tolerated The following medication(s) was prescribed: lidocaine topical 4 % cream 1 1 cream topical was prescribed at facility Currently I am going to recommend that two week follow-up we will see were things stand at that point. I'm gonna recommend that in regard to left forearm they continue with bacitracin and a Boarder Foam Dressing for protection. We will subsequently keep the orders the same for her left plantar foot ulcer. Overall I feel like things are progressing nicely. Please see above for specific wound care orders. We will see patient for re-evaluation in 1 week(s) here in the clinic. If anything worsens or changes patient will contact our office for additional recommendations. Electronic Signature(s) Signed: 11/09/2017 8:59:01 PM By: Worthy Keeler PA-C Entered By: Worthy Keeler on 11/09/2017 10:37:47 Angela Patterson ([id number]) -------------------------------------------------------------------------------- ROS/PFSH Details Patient Name: Angela Patterson. Date of Service: 11/09/2017 9:30 AM Medical Record Number: [id number] Patient Account Number: [id number] Date of Birth/Sex: 1920-09-27 (84 y.o. F) Treating RN: Ahmed Prima Primary Care Provider: Einar Pheasant Other Clinician: Referring Provider: Einar Pheasant Treating Provider/Extender: Melburn Hake, HOYT Weeks in Treatment: 8 Information Obtained From Patient Wound History Do you currently have one or more open woundso Yes How many open wounds do you currently haveo 1 Approximately how long have you had your woundso one month open How have you been  treating your wound(s) until nowo wound cleanser Has your wound(s) ever healed and then re-openedo Yes Have you had any lab work done in the past montho No Have you tested positive for an antibiotic resistant organism (MRSA, VRE)o No Have you tested positive for osteomyelitis (bone infection)o No Have you had any tests for circulation on your legso No Constitutional Symptoms (General Health) Complaints and Symptoms: Negative for: Fever; Chills Eyes Medical History: Positive for: Cataracts - removed "a few years ago" (B ); Glaucoma Ear/Nose/Mouth/Throat Medical History: Negative for: Chronic sinus problems/congestion; Middle ear problems Hematologic/Lymphatic Medical History: Negative for: Anemia; Hemophilia; Human Immunodeficiency Virus; Lymphedema; Sickle Cell Disease Respiratory Complaints and Symptoms: No Complaints or Symptoms Medical History: Negative for: Aspiration; Asthma; Chronic Obstructive Pulmonary Disease (COPD); Pneumothorax; Sleep Apnea; Tuberculosis Cardiovascular Complaints and Symptoms: No Complaints or Symptoms Medical History: Positive for: Hypertension Past Medical History NotesMarland Kitchen LYNDI, HOLBEIN ([id number]) TIA Gastrointestinal Medical History: Negative for: Cirrhosis ; Crohnos; Hepatitis A; Hepatitis B; Hepatitis C Endocrine Medical History: Positive for: Type II Diabetes Time with diabetes: 2 years Treated with: Oral agents, Diet Blood sugar tested every day: Yes Tested : twice Blood sugar testing results: Bedtime: 115 Genitourinary Medical History: Negative for: End Stage Renal Disease Immunological Medical History: Negative for: Lupus Erythematosus; Raynaudos; Scleroderma Integumentary (Skin) Medical History: Positive for: History of pressure wounds Negative for: History of Burn Musculoskeletal Medical History: Negative for: Gout; Rheumatoid Arthritis; Osteoarthritis; Osteomyelitis Neurologic Medical History: Positive for:  Neuropathy Past Medical History Notes: TIA Oncologic Medical History: Negative for: Received Chemotherapy; Received Radiation Psychiatric Complaints  and Symptoms: No Complaints or Symptoms HBO Extended History Items Eyes: Eyes: Cataracts Glaucoma Angela Patterson, Angela Patterson ([id number]) Immunizations Pneumococcal Vaccine: Received Pneumococcal Vaccination: Yes Implantable Devices Family and Social History Cancer: Yes - Father; Diabetes: Yes - Siblings; Heart Disease: No; Hereditary Spherocytosis: No; Hypertension: No; Kidney Disease: No; Lung Disease: No; Seizures: No; Stroke: Yes - Mother; Thyroid Problems: No; Tuberculosis: No; Never smoker; Marital Status - Widowed; Alcohol Use: Never; Drug Use: No History; Caffeine Use: Daily; Financial Concerns: No; Food, Clothing or Shelter Needs: No; Support System Lacking: No; Transportation Concerns: No; Advanced Directives: Yes (Not Provided); Patient does not want information on Advanced Directives; Do not resuscitate: No; Living Will: Yes (Copy provided); Medical Power of Attorney: Yes - Olen Cordial- daughter (Copy provided) Physician Affirmation I have reviewed and agree with the above information. Electronic Signature(s) Signed: 11/09/2017 8:59:01 PM By: Worthy Keeler PA-C Signed: 11/12/2017 4:29:16 PM By: Alric Quan Entered By: Worthy Keeler on 11/09/2017 10:35:05 Angela Patterson ([id number]) -------------------------------------------------------------------------------- SuperBill Details Patient Name: Angela Patterson. Date of Service: 11/09/2017 Medical Record Number: [id number] Patient Account Number: [id number] Date of Birth/Sex: [birth year] (84 y.o. F) Treating RN: Ahmed Prima Primary Care Provider: Einar Pheasant Other Clinician: Referring Provider: Einar Pheasant Treating Provider/Extender: Melburn Hake, HOYT Weeks in Treatment: 8 Diagnosis Coding ICD-10 Codes Code Description E11.621 Type 2 diabetes mellitus with foot  ulcer E11.40 Type 2 diabetes mellitus with diabetic neuropathy, unspecified L97.522 Non-pressure chronic ulcer of other part of left foot with fat layer exposed S51.802A Unspecified open wound of left forearm, initial encounter L84 Corns and callosities I10 Essential (primary) hypertension Facility Procedures CPT4 Code: [id number] Description: 94320 - DEB SUBQ TISSUE 20 SQ CM/< ICD-10 Diagnosis Description L97.522 Non-pressure chronic ulcer of other part of left foot with fat Modifier: layer exposed Quantity: 1 Physician Procedures CPT4 Code: 0379444 Description: 61901 - WC PHYS LEVEL 4 - EST PT ICD-10 Diagnosis Description E11.621 Type 2 diabetes mellitus with foot ulcer E11.40 Type 2 diabetes mellitus with diabetic neuropathy, unspecified L97.522 Non-pressure chronic ulcer of other part of left  foot with fat S51.802A Unspecified open wound of left forearm, initial encounter Modifier: 25 layer exposed Quantity: 1 CPT4 Code: 2224114 Description: 64314 - WC PHYS SUBQ TISS 20 SQ CM ICD-10 Diagnosis Description L97.522 Non-pressure chronic ulcer of other part of left foot with fat Modifier: layer exposed Quantity: 1 Electronic Signature(s) Signed: 11/09/2017 8:59:01 PM By: Worthy Keeler PA-C Entered By: Worthy Keeler on 11/09/2017 10:38:10

## 2017-11-16 NOTE — Progress Notes (Signed)
Angela Patterson, Angela Patterson ([id number]) Visit Report for 11/09/2017 Arrival Information Details Patient Name: Angela Patterson, Angela Patterson. Date of Service: 11/09/2017 9:30 AM Medical Record Number: [id number] Patient Account Number: [id number] Date of Birth/Sex: 08/27/1920 (84 y.o. F) Treating RN: Roger Shelter Primary Care Toree Edling: Einar Pheasant Other Clinician: Referring Rodgerick Gilliand: Einar Pheasant Treating Mallary Kreger/Extender: Melburn Hake, HOYT Weeks in Treatment: 8 Visit Information History Since Last Visit All ordered tests and consults were completed: No Patient Arrived: Ambulatory Added or deleted any medications: No Arrival Time: 09:44 Any new allergies or adverse reactions: No Accompanied By: son Had a fall or experienced change in No Transfer Assistance: None activities of daily living that may affect Patient Identification Verified: Yes risk of falls: Secondary Verification Process Completed: Yes Signs or symptoms of abuse/neglect since last visito No Patient Requires Transmission-Based No Hospitalized since last visit: No Precautions: Implantable device outside of the clinic excluding No Patient Has Alerts: Yes cellular tissue based products placed in the center Patient Alerts: DMII since last visit: Pain Present Now: No Electronic Signature(s) Signed: 11/09/2017 4:51:25 PM By: Roger Shelter Entered By: Roger Shelter on 11/09/2017 09:45:33 Angela Patterson ([id number]) -------------------------------------------------------------------------------- Encounter Discharge Information Details Patient Name: Angela Patterson. Date of Service: 11/09/2017 9:30 AM Medical Record Number: [id number] Patient Account Number: [id number] Date of Birth/Sex: [birth year] (84 y.o. F) Treating RN: Ahmed Prima Primary Care Momin Misko: Einar Pheasant Other Clinician: Referring Charnell Peplinski: Einar Pheasant Treating Adlynn Lowenstein/Extender: Melburn Hake, HOYT Weeks in Treatment: 8 Encounter Discharge Information  Items Discharge Pain Level: 0 Discharge Condition: Stable Ambulatory Status: Ambulatory Discharge Destination: Home Transportation: Private Auto Accompanied By: son Schedule Follow-up Appointment: Yes Medication Reconciliation completed and No provided to Patient/Care Kaseem Vastine: Provided on Clinical Summary of Care: 11/09/2017 Form Type Recipient Paper Patient EG Electronic Signature(s) Signed: 11/12/2017 4:29:16 PM By: Alric Quan Entered By: Alric Quan on 11/09/2017 10:34:54 Angela Patterson ([id number]) -------------------------------------------------------------------------------- Lower Extremity Assessment Details Patient Name: Angela Patterson. Date of Service: 11/09/2017 9:30 AM Medical Record Number: [id number] Patient Account Number: [id number] Date of Birth/Sex: 08-Sep-1920 (84 y.o. F) Treating RN: Roger Shelter Primary Care Shamicka Inga: Einar Pheasant Other Clinician: Referring Skiler Olden: Einar Pheasant Treating Timoty Bourke/Extender: Melburn Hake, HOYT Weeks in Treatment: 8 Edema Assessment Assessed: [Left: No] [Right: No] Edema: [Left: N] [Right: o] Vascular Assessment Claudication: Claudication Assessment [Left:None] Pulses: Dorsalis Pedis Palpable: [Left:Yes] Posterior Tibial Extremity colors, hair growth, and conditions: Extremity Color: [Left:Normal] Hair Growth on Extremity: [Left:No] Temperature of Extremity: [Left:Warm] Capillary Refill: [Left:< 3 seconds] Toe Nail Assessment Left: Right: Thick: Yes Discolored: Yes Deformed: No Improper Length and Hygiene: Yes Electronic Signature(s) Signed: 11/09/2017 4:51:25 PM By: Roger Shelter Entered By: Roger Shelter on 11/09/2017 09:59:51 Angela Patterson ([id number]) -------------------------------------------------------------------------------- Multi Wound Chart Details Patient Name: Angela Patterson. Date of Service: 11/09/2017 9:30 AM Medical Record Number: [id number] Patient Account Number:  [id number] Date of Birth/Sex: [birth year] (84 y.o. F) Treating RN: Ahmed Prima Primary Care Nyimah Shadduck: Einar Pheasant Other Clinician: Referring Travon Crochet: Einar Pheasant Treating Nur Rabold/Extender: Melburn Hake, HOYT Weeks in Treatment: 8 Vital Signs Height(in): 65 Pulse(bpm): 88 Weight(lbs): 107 Blood Pressure(mmHg): 185/69 Body Mass Index(BMI): 18 Temperature(F): 97.8 Respiratory Rate 18 (breaths/min): Photos: [4:No Photos] [5:No Photos] [N/A:N/A] Wound Location: [4:Left Metatarsal head first - Plantar] [5:Left Forearm] [N/A:N/A] Wounding Event: [4:Pressure Injury] [5:Trauma] [N/A:N/A] Primary Etiology: [4:Pressure Ulcer] [5:Trauma, Other] [N/A:N/A] Comorbid History: [4:Cataracts, Glaucoma, Hypertension, Type II Diabetes, History of pressure wounds, Neuropathy] [5:Cataracts, Glaucoma, Hypertension, Type II Diabetes, History of pressure wounds, Neuropathy] [N/A:N/A] Date Acquired: [4:08/20/2017] [5:10/09/2017] [N/A:N/A] Weeks of Treatment: [  4:8] [5:0] [N/A:N/A] Wound Status: [4:Open] [5:Open] [N/A:N/A] Measurements L x W x D [4:0.4x0.5x0.3] [5:2.2x0.5x0.1] [N/A:N/A] (cm) Area (cm) : [4:0.157] [5:0.864] [N/A:N/A] Volume (cm) : [4:0.047] [5:0.086] [N/A:N/A] % Reduction in Area: [4:91.10%] [5:0.00%] [N/A:N/A] % Reduction in Volume: [4:94.70%] [5:0.00%] [N/A:N/A] Starting Position 1 [4:7] (o'clock): Ending Position 1 [4:2] (o'clock): Maximum Distance 1 (cm): [4:0.4] Undermining: [4:Yes] [5:No] [N/A:N/A] Classification: [4:Category/Stage IV] [5:Partial Thickness] [N/A:N/A] Exudate Amount: [4:Large] [5:Medium] [N/A:N/A] Exudate Type: [4:Serosanguineous] [5:Serous] [N/A:N/A] Exudate Color: [4:red, brown] [5:amber] [N/A:N/A] Wound Margin: [4:Indistinct, nonvisible] [5:Flat and Intact] [N/A:N/A] Granulation Amount: [4:Medium (34-66%)] [5:Large (67-100%)] [N/A:N/A] Granulation Quality: [4:Red] [5:Red] [N/A:N/A] Necrotic Amount: [4:Medium (34-66%)] [5:Small (1-33%)]  [N/A:N/A] Necrotic Tissue: [4:Eschar, Adherent Slough] [5:Adherent Slough] [N/A:N/A] Exposed Structures: [4:Fascia: Yes Fat Layer (Subcutaneous Tissue) Exposed: Yes Tendon: No] [5:Fascia: No Fat Layer (Subcutaneous Tissue) Exposed: No Tendon: No] [N/A:N/A] Muscle: No Muscle: No Joint: No Joint: No Bone: No Bone: No Epithelialization: None Small (1-33%) N/A Periwound Skin Texture: Callus: Yes Excoriation: No N/A Excoriation: No Induration: No Induration: No Callus: No Crepitus: No Crepitus: No Rash: No Rash: No Scarring: No Scarring: No Periwound Skin Moisture: Dry/Scaly: Yes Maceration: Yes N/A Maceration: No Dry/Scaly: No Periwound Skin Color: Atrophie Blanche: No Atrophie Blanche: No N/A Cyanosis: No Cyanosis: No Ecchymosis: No Ecchymosis: No Erythema: No Erythema: No Hemosiderin Staining: No Hemosiderin Staining: No Mottled: No Mottled: No Pallor: No Pallor: No Rubor: No Rubor: No Temperature: No Abnormality No Abnormality N/A Tenderness on Palpation: Yes Yes N/A Wound Preparation: Ulcer Cleansing: Ulcer Cleansing: N/A Rinsed/Irrigated with Saline Rinsed/Irrigated with Saline Topical Anesthetic Applied: Topical Anesthetic Applied: Other: lidocaine 4% Other: lidocaine 4% Treatment Notes Electronic Signature(s) Signed: 11/12/2017 4:29:16 PM By: Alric Quan Entered By: Alric Quan on 11/09/2017 10:18:33 Angela Patterson ([id number]) -------------------------------------------------------------------------------- [place] Details Patient Name: Angela Patterson. Date of Service: 11/09/2017 9:30 AM Medical Record Number: [id number] Patient Account Number: [id number] Date of Birth/Sex: 01-28-1921 (84 y.o. F) Treating RN: Ahmed Prima Primary Care Ethlyn Alto: Einar Pheasant Other Clinician: Referring Mase Dhondt: Einar Pheasant Treating Kadie Balestrieri/Extender: Melburn Hake, HOYT Weeks in Treatment: 8 Active Inactive ` Abuse / Safety /  Falls / Self Care Management Nursing Diagnoses: Potential for falls Goals: Patient will not experience any injury related to falls Date Initiated: 09/10/2017 Target Resolution Date: 11/27/2017 Goal Status: Active Interventions: Assess fall risk on admission and as needed Notes: ` Orientation to the Wound Care Program Nursing Diagnoses: Knowledge deficit related to the wound healing center program Goals: Patient/caregiver will verbalize understanding of the [facility] Program Date Initiated: 09/10/2017 Target Resolution Date: 11/27/2017 Goal Status: Active Interventions: Provide education on orientation to the wound center Notes: ` Wound/Skin Impairment Nursing Diagnoses: Impaired tissue integrity Goals: Ulcer/skin breakdown will heal within 14 weeks Date Initiated: 09/10/2017 Target Resolution Date: 11/27/2017 Goal Status: Active Interventions: Angela Patterson, Angela Patterson ([id number]) Assess patient/caregiver ability to obtain necessary supplies Assess patient/caregiver ability to perform ulcer/skin care regimen upon admission and as needed Assess ulceration(s) every visit Notes: Electronic Signature(s) Signed: 11/12/2017 4:29:16 PM By: Alric Quan Entered By: Alric Quan on 11/09/2017 10:18:24 Angela Patterson ([id number]) -------------------------------------------------------------------------------- Pain Assessment Details Patient Name: Angela Patterson. Date of Service: 11/09/2017 9:30 AM Medical Record Number: [id number] Patient Account Number: [id number] Date of Birth/Sex: 02-25-1921 (84 y.o. F) Treating RN: Roger Shelter Primary Care Vonceil Upshur: Einar Pheasant Other Clinician: Referring Hortense Cantrall: Einar Pheasant Treating Makaiah Terwilliger/Extender: Melburn Hake, HOYT Weeks in Treatment: 8 Active Problems Location of Pain Severity and Description of Pain Patient Has Paino No Site Locations Pain Management  and Medication Current Pain Management: Electronic  Signature(s) Signed: 11/09/2017 4:51:25 PM By: Roger Shelter Entered By: Roger Shelter on 11/09/2017 09:45:44 Angela Patterson ([id number]) -------------------------------------------------------------------------------- Patient/Caregiver Education Details Patient Name: Angela Patterson. Date of Service: 11/09/2017 9:30 AM Medical Record Number: [id number] Patient Account Number: [id number] Date of Birth/Gender: 11-19-1920 (84 y.o. F) Treating RN: Ahmed Prima Primary Care Physician: Einar Pheasant Other Clinician: Referring Physician: Einar Pheasant Treating Physician/Extender: Sharalyn Ink in Treatment: 8 Education Assessment Education Provided To: Patient Education Topics Provided Wound/Skin Impairment: Handouts: Caring for Your Ulcer, Other: change dressing as ordered Methods: Demonstration, Explain/Verbal Responses: State content correctly Electronic Signature(s) Signed: 11/12/2017 4:29:16 PM By: Alric Quan Entered By: Alric Quan on 11/09/2017 10:35:09 Angela Patterson ([id number]) -------------------------------------------------------------------------------- Wound Assessment Details Patient Name: Angela Patterson. Date of Service: 11/09/2017 9:30 AM Medical Record Number: [id number] Patient Account Number: [id number] Date of Birth/Sex: 1921/06/05 (84 y.o. F) Treating RN: Roger Shelter Primary Care Ridgely Anastacio: Einar Pheasant Other Clinician: Referring Lenell Lama: Einar Pheasant Treating Aftyn Nott/Extender: Melburn Hake, HOYT Weeks in Treatment: 8 Wound Status Wound Number: 4 Primary Pressure Ulcer Etiology: Wound Location: Left Metatarsal head first - Plantar Wound Open Wounding Event: Pressure Injury Status: Date Acquired: 08/20/2017 Comorbid Cataracts, Glaucoma, Hypertension, Type II Weeks Of Treatment: 8 History: Diabetes, History of pressure wounds, Clustered Wound: No Neuropathy Photos Photo Uploaded By: Gretta Cool, BSN, RN, CWS, Kim on  11/11/2017 14:56:20 Wound Measurements Length: (cm) 0.4 Width: (cm) 0.5 Depth: (cm) 0.3 Area: (cm) 0.157 Volume: (cm) 0.047 % Reduction in Area: 91.1% % Reduction in Volume: 94.7% Epithelialization: None Undermining: Yes Starting Position (o'clock): 7 Ending Position (o'clock): 2 Maximum Distance: (cm) 0.4 Wound Description Classification: Category/Stage IV Wound Margin: Indistinct, nonvisible Exudate Amount: Large Exudate Type: Serosanguineous Exudate Color: red, brown Foul Odor After Cleansing: No Slough/Fibrino Yes Wound Bed Granulation Amount: Medium (34-66%) Exposed Structure Granulation Quality: Red Fascia Exposed: Yes Necrotic Amount: Medium (34-66%) Fat Layer (Subcutaneous Tissue) Exposed: Yes Necrotic Quality: Eschar, Adherent Slough Tendon Exposed: No Muscle Exposed: No Joint Exposed: No Angela Patterson, Angela Patterson ([id number]) Bone Exposed: No Periwound Skin Texture Texture Color No Abnormalities Noted: No No Abnormalities Noted: No Callus: Yes Atrophie Blanche: No Crepitus: No Cyanosis: No Excoriation: No Ecchymosis: No Induration: No Erythema: No Rash: No Hemosiderin Staining: No Scarring: No Mottled: No Pallor: No Moisture Rubor: No No Abnormalities Noted: No Dry / Scaly: Yes Temperature / Pain Maceration: No Temperature: No Abnormality Tenderness on Palpation: Yes Wound Preparation Ulcer Cleansing: Rinsed/Irrigated with Saline Topical Anesthetic Applied: Other: lidocaine 4%, Treatment Notes Wound #4 (Left, Plantar Metatarsal head first) 1. Cleansed with: Clean wound with Normal Saline 2. Anesthetic Topical Lidocaine 4% cream to wound bed prior to debridement 3. Peri-wound Care: Skin Prep 4. Dressing Applied: Other dressing (specify in notes) 5. Secondary [place] Notes silvercel Electronic Signature(s) Signed: 11/09/2017 4:51:25 PM By: Roger Shelter Entered By: Roger Shelter on 11/09/2017  09:52:42 Angela Patterson ([id number]) -------------------------------------------------------------------------------- Wound Assessment Details Patient Name: Angela Patterson. Date of Service: 11/09/2017 9:30 AM Medical Record Number: [id number] Patient Account Number: [id number] Date of Birth/Sex: Aug 02, 1920 (84 y.o. F) Treating RN: Roger Shelter Primary Care Madysun Thall: Einar Pheasant Other Clinician: Referring Brandey Vandalen: Einar Pheasant Treating Lankford Gutzmer/Extender: Melburn Hake, HOYT Weeks in Treatment: 8 Wound Status Wound Number: 5 Primary Trauma, Other Etiology: Wound Location: Left Forearm Wound Open Wounding Event: Trauma Status: Date Acquired: 10/09/2017 Comorbid Cataracts, Glaucoma, Hypertension, Type II Weeks Of Treatment: 0 History: Diabetes, History of pressure wounds, Clustered Wound: No  Neuropathy Photos Photo Uploaded By: Gretta Cool, BSN, RN, CWS, Kim on 11/11/2017 14:56:20 Wound Measurements Length: (cm) 2.2 Width: (cm) 0.5 Depth: (cm) 0.1 Area: (cm) 0.864 Volume: (cm) 0.086 % Reduction in Area: 0% % Reduction in Volume: 0% Epithelialization: Small (1-33%) Tunneling: No Undermining: No Wound Description Classification: Partial Thickness Foul Odor Wound Margin: Flat and Intact Slough/Fi Exudate Amount: Medium Exudate Type: Serous Exudate Color: amber After Cleansing: No brino Yes Wound Bed Granulation Amount: Large (67-100%) Exposed Structure Granulation Quality: Red Fascia Exposed: No Necrotic Amount: Small (1-33%) Fat Layer (Subcutaneous Tissue) Exposed: No Necrotic Quality: Adherent Slough Tendon Exposed: No Muscle Exposed: No Joint Exposed: No Bone Exposed: No Periwound Skin Texture Angela Patterson, Angela Patterson. ([id number]) Texture Color No Abnormalities Noted: No No Abnormalities Noted: No Callus: No Atrophie Blanche: No Crepitus: No Cyanosis: No Excoriation: No Ecchymosis: No Induration: No Erythema: No Rash: No Hemosiderin Staining: No Scarring:  No Mottled: No Pallor: No Moisture Rubor: No No Abnormalities Noted: No Dry / Scaly: No Temperature / Pain Maceration: Yes Temperature: No Abnormality Tenderness on Palpation: Yes Wound Preparation Ulcer Cleansing: Rinsed/Irrigated with Saline Topical Anesthetic Applied: Other: lidocaine 4%, Treatment Notes Wound #5 (Left Forearm) 1. Cleansed with: Clean wound with Normal Saline 2. Anesthetic Topical Lidocaine 4% cream to wound bed prior to debridement 4. Dressing Applied: Other dressing (specify in notes) 5. Secondary Dressing Applied Bordered Foam Dressing Notes bacitracin Electronic Signature(s) Signed: 11/09/2017 4:51:25 PM By: Roger Shelter Entered By: Roger Shelter on 11/09/2017 09:58:51 Angela Patterson ([id number]) -------------------------------------------------------------------------------- [place] Details Patient Name: Angela Patterson. Date of Service: 11/09/2017 9:30 AM Medical Record Number: [id number] Patient Account Number: [id number] Date of Birth/Sex: [birth year] (84 y.o. F) Treating RN: Roger Shelter Primary Care Symone Cornman: Einar Pheasant Other Clinician: Referring Shauntea Lok: Einar Pheasant Treating Avigdor Dollar/Extender: Melburn Hake, HOYT Weeks in Treatment: 8 Vital Signs Time Taken: 09:45 Temperature (F): 97.8 Height (in): 65 Pulse (bpm): 77 Weight (lbs): 107 Respiratory Rate (breaths/min): 18 Body Mass Index (BMI): 17.8 Blood Pressure (mmHg): 185/69 Reference Range: 80 - 120 mg / dl Electronic Signature(s) Signed: 11/09/2017 4:51:25 PM By: Roger Shelter Entered By: Roger Shelter on 11/09/2017 09:46:29

## 2017-11-23 ENCOUNTER — Encounter: Payer: Medicare Other | Attending: Physician Assistant | Admitting: Physician Assistant

## 2017-11-23 DIAGNOSIS — Z8673 Personal history of transient ischemic attack (TIA), and cerebral infarction without residual deficits: Secondary | ICD-10-CM | POA: Insufficient documentation

## 2017-11-23 DIAGNOSIS — L97522 Non-pressure chronic ulcer of other part of left foot with fat layer exposed: Secondary | ICD-10-CM | POA: Insufficient documentation

## 2017-11-23 DIAGNOSIS — E11621 Type 2 diabetes mellitus with foot ulcer: Secondary | ICD-10-CM | POA: Insufficient documentation

## 2017-11-23 DIAGNOSIS — I10 Essential (primary) hypertension: Secondary | ICD-10-CM | POA: Diagnosis not present

## 2017-11-23 DIAGNOSIS — E1140 Type 2 diabetes mellitus with diabetic neuropathy, unspecified: Secondary | ICD-10-CM | POA: Insufficient documentation

## 2017-11-23 DIAGNOSIS — L89894 Pressure ulcer of other site, stage 4: Secondary | ICD-10-CM | POA: Diagnosis not present

## 2017-11-25 NOTE — Progress Notes (Signed)
TENE, GATO ([id number]) Visit Report for 11/23/2017 Chief Complaint Document Details Patient Name: Angela Patterson, Angela Patterson. Date of Service: 11/23/2017 9:30 AM Medical Record Number: [id number] Patient Account Number: [id number] Date of Birth/Sex: 1921-06-26 (84 y.o. F) Treating RN: Ahmed Prima Primary Care Provider: Einar Pheasant Other Clinician: Referring Provider: Einar Pheasant Treating Provider/Extender: Melburn Hake, Erikson Danzy Weeks in Treatment: 10 Information Obtained from: Patient Chief Complaint Left plantar foot ulcer Electronic Signature(s) Signed: 11/23/2017 5:56:33 PM By: Worthy Keeler PA-C Entered By: Worthy Keeler on 11/23/2017 09:33:00 Angela Patterson ([id number]) -------------------------------------------------------------------------------- Debridement Details Patient Name: Angela Patterson. Date of Service: 11/23/2017 9:30 AM Medical Record Number: [id number] Patient Account Number: [id number] Date of Birth/Sex: Apr 27, 1921 (84 y.o. F) Treating RN: Ahmed Prima Primary Care Provider: Einar Pheasant Other Clinician: Referring Provider: Einar Pheasant Treating Provider/Extender: Melburn Hake, Taje Littler Weeks in Treatment: 10 Debridement Performed for Wound #4 Left,Plantar Metatarsal head first Assessment: Performed By: Physician STONE III, Mckenzey Parcell E., PA-C Debridement Type: Debridement Pre-procedure Verification/Time Yes - 10:24 Out Taken: Start Time: 10:24 Pain Control: Lidocaine 4% Topical Solution Total Area Debrided (L x W): 0.4 (cm) x 0.5 (cm) = 0.2 (cm) Tissue and other material Viable, Non-Viable, Callus, Slough, Subcutaneous, Fibrin/Exudate, Slough debrided: Level: Skin/Subcutaneous Tissue Debridement Description: Excisional Instrument: Curette Bleeding: Minimum Hemostasis Achieved: Pressure End Time: 10:31 Procedural Pain: 0 Post Procedural Pain: 0 Response to Treatment: Procedure was tolerated well Level of Consciousness: Awake and Alert Post Procedure  Vitals: Temperature: 97.8 Pulse: 74 Respiratory Rate: 18 Blood Pressure: Systolic Blood Pressure: 010 Diastolic Blood Pressure: 64 Post Debridement Measurements of Total Wound Length: (cm) 0.5 Stage: Category/Stage IV Width: (cm) 0.6 Depth: (cm) 0.3 Volume: (cm) 0.071 Character of Wound/Ulcer Post Requires Further Debridement Debridement: Post Procedure Diagnosis Same as Pre-procedure Electronic Signature(s) Signed: 11/23/2017 5:11:14 PM By: Alric Quan Signed: 11/23/2017 5:56:33 PM By: Worthy Keeler PA-C Entered By: Alric Quan on 11/23/2017 10:31:21 Angela Patterson ([name] ([id number]) -------------------------------------------------------------------------------- HPI Details Patient Name: Angela Patterson. Date of Service: 11/23/2017 9:30 AM Medical Record Number: [id number] Patient Account Number: [id number] Date of Birth/Sex: 07-27-1920 (84 y.o. F) Treating RN: Ahmed Prima Primary Care Provider: Einar Pheasant Other Clinician: Referring Provider: Einar Pheasant Treating Provider/Extender: Melburn Hake, Ashlee Player Weeks in Treatment: 10 History of Present Illness HPI Description: this very pleasant 84 year old patient who is extremely active and ambulating all day has had problems with the left foot for over a year. Her daughter who is her caregiver says she drives around and even goes around and does her chores by herself. this is the third recurrence and after the last time we saw in June and discharged her her daughter has got her to pace of diabetic shoes and she's been wearing these regularly. She has been on the shoes for about 6 months now. Her diabetes is under control and her blood sugars run from a anywhere between 110 to 130 and she checks them twice a day. no fever or discharge. During her initial visit, an x-ray of her left foot was done on 09/26/2014. There was no cortical erosion noted there was mild osteopenia in the distal first  metatarsal. Radiologist has recommended a MRI of the left foot to exclude osteomyelitis. MRI done on 10/09/2014 reveals that the area where she had a phlegmon is without osteomyelitis. the bone marrow and other areas are compatible with reactive edema rather than osteomyelitis. 08/06/2015 -- x-ray of the left foot done on 07/30/2015 shows IMPRESSION:Soft tissue wound with a bandage  is noted over the plantar aspect of the distal left foot. Diffuse osteopenia degenerative change. No acute bony abnormality 09/16/2015 -- last week she had gone to [facility] and met with Harrington Challenger who had spoken to me over the phone. Instead of a DH walking boot he has given her an offloading boot which will make her more steady and this boot is called DH healing sandal with a custom-made insole to offload this area. Readmission: 09/10/17 on evaluation today patient presents for reevaluation in regard to her left plantar foot ulcer has actually been since 2017 since we have last seen her. With that being said she has seem to do fairly well in the interim since that time. Patient is seen with both her daughter as well as her son present for today's evaluation. Her son tells me that they have ordered diabetic shoes for her which obviously are custom-made and that they were hoping these would be available today to bring in. With that being said she does also have some of the boots left that she had previously for offloading and they brought those with them today as well. With that being said up to this point patient has just been using her tennis shoes in regard to her footwear. She has had drainage from the ulcer and it really does not seem to be improving significantly at this time. She does have a history of diabetes mellitus type II which she is on medication and she is also on a blood thinner. Fortunately she does not seem to have any evidence of discomfort due to the diabetic neuropathy.  Patient does have a very high arch which is why she seems to get pressure to this area of the first metatarsal on the left foot leading to the ulcerations that develop. The current ulceration is in the exact location of her prior ulceration. She has not had a recent x-ray. 09/17/17 On evaluation today patient appears to be doing well in regard to her left plantar foot ulcer. Since last week she has had excellent filling in of the wound and this is definitely much smaller than previously noted. With that being said she is having no pain she has neuropathy and therefore does not feel any pain anyway. There's no evidence of infection which is great news. She does have callous but again this is not nearly as significant as previously noted. Overall I'm definitely pleased with how things stand today. 09/24/17 on evaluation today patient's left plantar foot ulcer appears to show signs of good improvement compared even to last week's evaluation. She is slowly healing and again from the first time I saw this this definitely appears to be doing much better. She's not have any discomfort which is great news unfortunately most of this is actually due to the fact that she has neuropathy. Other than that however she has been tolerating the dressing changes without complication she actually performs the dressing changes herself. CHANON, LONEY ([id number]) 10/08/17 on evaluation today patient's foot ulcer on the right actually appears to be doing a little bit worse unfortunately. She has been tolerating the dressing changes fairly well she performs these herself. Although I'm unsure that she has really been packing the alginate into the wound I have discussed this with her before but it sounds as if she may not be. Nonetheless I did have a discussion concerning this again today with the daughter present as well as her son and other daughter whom I just  met today. Patient did get her custom shoes which finally arrived  which is definitely good news. These do appear to be excellent I did check them out in the offloading as well as our support is great. 10/15/17 on evaluation today patient appears to be doing little better compared to last week's evaluation. She does have a little bit of bruising on the lateral border of the wound which seems to be indicative of some pressure occurring at that site. With that being said this is most likely as a result of the dressing slipping apparently with the new shoes this is not been staying in place as well. That's one thing we are going to have to continue to keep an eye on and see if there's any issues in that regard. Nonetheless we may need to go back to the offloading boots until we get this healed and then have her transfer back to her custom shoes depend on how things progress. Nonetheless at this time I do not see any definite or permanent damage and again she does seem to be doing a little bit better in my opinion. 10/22/17 on evaluation today patient actually appears to be doing rather well in regard to her left foot ulcer. The dressing has been staying on better which is good news. Subsequently the patient also has been utilizing her diabetic shoes with good result as well. Overall I think it is helpful for her now that the dressings are staying in place there appears to be no new injury and the wound of the ball is showing signs of improving. 11/09/17 on evaluation today patient appears to be doing better in regard to her left plantar foot ulcer. She has much less callous buildup this week compared to last time that I evaluated her two weeks prior. Overall I feel like this is definitely showing signs of improvement which is good news. There does not appear to be any new injury to the foot which is also excellent news. Overall I feel like that her shoes for offloading are doing in great job. Overall I'm pleased with the progress. She does have an area on her left forearm  which has previously been evaluated at the walk-in urgent care. She states that it seems to have done pretty well but her son would like for [place] to evaluate this further and ensure there is nothing that appears to be infected or otherwise causing her trouble. 11/23/17 on evaluation today patient's wound actually appears to be doing fairly well at this point. She has been tolerating the dressing changes without complication. She does seem to be making some progress in regard to the overall quality and size of the wound in my opinion although she still develop some callous there is no evidence of deep tissue injury and most of the dark discoloration seems to be working its way out at this point. There's no evidence of infection. Electronic Signature(s) Signed: 11/23/2017 5:56:33 PM By: Worthy Keeler PA-C Entered By: Worthy Keeler on 11/23/2017 12:39:43 Angela Patterson ([id number]) -------------------------------------------------------------------------------- Physical Exam Details Patient Name: Angela Patterson. Date of Service: 11/23/2017 9:30 AM Medical Record Number: [id number] Patient Account Number: [id number] Date of Birth/Sex: [birth year] (84 y.o. F) Treating RN: Ahmed Prima Primary Care Provider: Einar Pheasant Other Clinician: Referring Provider: Einar Pheasant Treating Provider/Extender: STONE III, Tearra Ouk Weeks in Treatment: 10 Constitutional Well-nourished and well-hydrated in no acute distress. Respiratory normal breathing without difficulty. Psychiatric this patient is able to make decisions and demonstrates good  insight into disease process. Alert and Oriented x 3. pleasant and cooperative. Notes Patient has no pain at this point in her wound did require some debridement to remove slough over the surface of the wound she also did required to breeding away of the callous surrounding. She tolerated this without complication. Post debridement the wound bed appears to be doing  much better. Electronic Signature(s) Signed: 11/23/2017 5:56:33 PM By: Worthy Keeler PA-C Entered By: Worthy Keeler on 11/23/2017 12:40:50 Angela Patterson ([id number]) -------------------------------------------------------------------------------- Physician Orders Details Patient Name: Angela Patterson. Date of Service: 11/23/2017 9:30 AM Medical Record Number: [id number] Patient Account Number: [id number] Date of Birth/Sex: August 28, 1920 (84 y.o. F) Treating RN: Ahmed Prima Primary Care Provider: Einar Pheasant Other Clinician: Referring Provider: Einar Pheasant Treating Provider/Extender: Melburn Hake, Terika Pillard Weeks in Treatment: 10 Verbal / Phone Orders: Yes Clinician: Carolyne Fiscal, Debi Read Back and Verified: Yes Diagnosis Coding ICD-10 Coding Code Description E11.621 Type 2 diabetes mellitus with foot ulcer E11.40 Type 2 diabetes mellitus with diabetic neuropathy, unspecified L97.522 Non-pressure chronic ulcer of other part of left foot with fat layer exposed S51.802A Unspecified open wound of left forearm, initial encounter L84 Corns and callosities I10 Essential (primary) hypertension Wound Cleansing Wound #4 Left,Plantar Metatarsal head first o Clean wound with Normal Saline. o Cleanse wound with mild soap and water o May Shower, gently pat wound dry prior to applying new dressing. Anesthetic (add to Medication List) Wound #4 Left,Plantar Metatarsal head first o Topical Lidocaine 4% cream applied to wound bed prior to debridement (In Clinic Only). Skin Barriers/Peri-Wound Care Wound #4 Left,Plantar Metatarsal head first o Skin Prep Primary Wound Dressing Wound #4 Left,Plantar Metatarsal head first o Silver Alginate Secondary Dressing Wound #4 Left,Plantar Metatarsal head first o Boardered Foam Dressing - or [place] with foam for padding o Foam Dressing Change Frequency Wound #4 Left,Plantar Metatarsal head first o Change dressing every other  day. Follow-up Appointments Wound #4 Left,Plantar Metatarsal head first o Return Appointment in 2 weeks. RATEEL, BELDIN ([id number]) Additional Orders / Instructions Wound #4 Left,Plantar Metatarsal head first o Increase protein intake. o Activity as tolerated Patient Medications Allergies: Iodinated Contrast Media - IV Dye Notifications Medication Indication Start End lidocaine DOSE 1 - topical 4 % cream - 1 cream topical Electronic Signature(s) Signed: 11/23/2017 5:11:14 PM By: Alric Quan Signed: 11/23/2017 5:56:33 PM By: Worthy Keeler PA-C Entered By: Alric Quan on 11/23/2017 10:32:23 Angela Patterson ([id number]) -------------------------------------------------------------------------------- Prescription 11/23/2017 Patient Name: Angela Patterson. Provider: Worthy Keeler PA-C Date of Birth: 12/20/1920 NPI#: [id number] Sex: F DEA#: [id number] Phone #: [phone] License #: Patient Address: [facility] [place], [place] [address] [address], [place], [place] [address] [phone] Allergies Iodinated Contrast Media - IV Dye Medication Medication: Route: Strength: Form: lidocaine topical 4% cream Class: TOPICAL LOCAL ANESTHETICS Dose: Frequency / Time: Indication: 1 1 cream topical Number of Refills: Number of Units: 0 Generic Substitution: Start Date: End Date: Administered at [facility]: Yes Time Administered: Time Discontinued: Note to Pharmacy: Signature(s): Date(s): Electronic Signature(s) Signed: 11/23/2017 5:11:14 PM By: Alric Quan Signed: 11/23/2017 5:56:33 PM By: Worthy Keeler PA-C Entered By: Alric Quan on 11/23/2017 10:32:24 Angela Patterson ([name] ([id number]) --------------------------------------------------------------------------------  Problem List Details Patient Name: Angela Patterson. Date of Service: 11/23/2017 9:30 AM Medical Record Number: [id number] Patient Account Number: [id number] Date of Birth/Sex: 1920-08-05 (84 y.o. F) Treating  RN: Ahmed Prima Primary Care Provider: Einar Pheasant Other Clinician: Referring Provider: Einar Pheasant Treating Provider/Extender: Melburn Hake, Zamoria Boss Weeks in Treatment: 10 Active Problems ICD-10 Impacting Encounter Code Description Active Date Wound Healing Diagnosis E11.621 Type 2 diabetes mellitus with foot ulcer 09/10/2017 Yes E11.40 Type 2 diabetes mellitus with diabetic neuropathy, 09/10/2017 Yes unspecified L97.522 Non-pressure chronic ulcer of other part of left foot with fat 09/10/2017 Yes layer exposed S51.802A Unspecified open wound of left forearm, initial encounter 11/09/2017 Yes L84 Corns and callosities 09/10/2017 Yes I10 Essential (primary) hypertension 09/10/2017 Yes Inactive Problems Resolved Problems Electronic Signature(s) Signed: 11/23/2017 5:56:33 PM By: Worthy Keeler PA-C Entered By: Worthy Keeler on 11/23/2017 09:32:51 Angela Patterson ([id number]) -------------------------------------------------------------------------------- Progress Note Details Patient Name: Angela Patterson. Date of Service: 11/23/2017 9:30 AM Medical Record Number: [id number] Patient Account Number: [id number] Date of Birth/Sex: 06-16-1921 (84 y.o. F) Treating RN: Ahmed Prima Primary Care Provider: Einar Pheasant Other Clinician: Referring Provider: Einar Pheasant Treating Provider/Extender: Melburn Hake, Nailah Luepke Weeks in Treatment: 10 Subjective Chief Complaint Information obtained from Patient Left plantar foot ulcer History of Present Illness (HPI) this very pleasant 84 year old patient who is extremely active and ambulating all day has had problems with the left foot for over a year. Her daughter who is her caregiver says she drives around and even goes around and does her chores by herself. this is the third  recurrence and after the last time we saw in June and discharged her her daughter has got her to pace of diabetic shoes and she's been wearing these regularly. She has been on the shoes for about 6 months now. Her diabetes is under control and her blood sugars run from a anywhere between 110 to 130 and she checks them twice a day. no fever or discharge. During her initial visit, an x-ray of her left foot was done on 09/26/2014. There was no cortical erosion noted there was mild osteopenia in the distal first metatarsal. Radiologist has recommended a MRI of the left foot to exclude osteomyelitis. MRI done on 10/09/2014 reveals that the area where she had a phlegmon is without osteomyelitis. the bone marrow and other areas are compatible with reactive edema rather than osteomyelitis. 08/06/2015 -- x-ray of the left foot done on 07/30/2015 shows IMPRESSION:Soft tissue wound with a bandage is noted over the plantar aspect of the distal left foot. Diffuse osteopenia degenerative change. No acute bony abnormality 09/16/2015 -- last week she had gone to [facility] and met with Harrington Challenger who had spoken to me over the phone. Instead of a DH walking boot he has given her an offloading boot which will make her more steady and this boot is called DH healing sandal with a custom-made insole to offload this area. Readmission: 09/10/17 on evaluation today patient presents for reevaluation in regard to her left plantar foot ulcer has actually been since 2017 since we have last seen her. With that being said she has seem to do fairly well in the interim since that time. Patient is seen with both her daughter as well as her son present for today's evaluation. Her son tells me that they have ordered diabetic shoes for her which obviously are custom-made and that they were hoping these would be available today to bring in. With that being said she does also have some of the boots left  that she had previously for offloading and they brought those with them today as well. With that being said  up to this point patient has just been using her tennis shoes in regard to her footwear. She has had drainage from the ulcer and it really does not seem to be improving significantly at this time. She does have a history of diabetes mellitus type II which she is on medication and she is also on a blood thinner. Fortunately she does not seem to have any evidence of discomfort due to the diabetic neuropathy. Patient does have a very high arch which is why she seems to get pressure to this area of the first metatarsal on the left foot leading to the ulcerations that develop. The current ulceration is in the exact location of her prior ulceration. She has not had a recent x-ray. 09/17/17 On evaluation today patient appears to be doing well in regard to her left plantar foot ulcer. Since last week she has had excellent filling in of the wound and this is definitely much smaller than previously noted. With that being said she is having no pain she has neuropathy and therefore does not feel any pain anyway. There's no evidence of infection which is great news. She does have callous but again this is not nearly as significant as previously noted. Overall I'm definitely pleased with how things stand today. KASIDI, SHANKER ([id number]) 09/24/17 on evaluation today patient's left plantar foot ulcer appears to show signs of good improvement compared even to last week's evaluation. She is slowly healing and again from the first time I saw this this definitely appears to be doing much better. She's not have any discomfort which is great news unfortunately most of this is actually due to the fact that she has neuropathy. Other than that however she has been tolerating the dressing changes without complication she actually performs the dressing changes herself. 10/08/17 on evaluation today patient's foot ulcer on  the right actually appears to be doing a little bit worse unfortunately. She has been tolerating the dressing changes fairly well she performs these herself. Although I'm unsure that she has really been packing the alginate into the wound I have discussed this with her before but it sounds as if she may not be. Nonetheless I did have a discussion concerning this again today with the daughter present as well as her son and other daughter whom I just met today. Patient did get her custom shoes which finally arrived which is definitely good news. These do appear to be excellent I did check them out in the offloading as well as our support is great. 10/15/17 on evaluation today patient appears to be doing little better compared to last week's evaluation. She does have a little bit of bruising on the lateral border of the wound which seems to be indicative of some pressure occurring at that site. With that being said this is most likely as a result of the dressing slipping apparently with the new shoes this is not been staying in place as well. That's one thing we are going to have to continue to keep an eye on and see if there's any issues in that regard. Nonetheless we may need to go back to the offloading boots until we get this healed and then have her transfer back to her custom shoes depend on how things progress. Nonetheless at this time I do not see any definite or permanent damage and again she does seem to be doing a little bit better in my opinion. 10/22/17 on evaluation today patient actually appears to be doing  rather well in regard to her left foot ulcer. The dressing has been staying on better which is good news. Subsequently the patient also has been utilizing her diabetic shoes with good result as well. Overall I think it is helpful for her now that the dressings are staying in place there appears to be no new injury and the wound of the ball is showing signs of improving. 11/09/17 on  evaluation today patient appears to be doing better in regard to her left plantar foot ulcer. She has much less callous buildup this week compared to last time that I evaluated her two weeks prior. Overall I feel like this is definitely showing signs of improvement which is good news. There does not appear to be any new injury to the foot which is also excellent news. Overall I feel like that her shoes for offloading are doing in great job. Overall I'm pleased with the progress. She does have an area on her left forearm which has previously been evaluated at the walk-in urgent care. She states that it seems to have done pretty well but her son would like for [place] to evaluate this further and ensure there is nothing that appears to be infected or otherwise causing her trouble. 11/23/17 on evaluation today patient's wound actually appears to be doing fairly well at this point. She has been tolerating the dressing changes without complication. She does seem to be making some progress in regard to the overall quality and size of the wound in my opinion although she still develop some callous there is no evidence of deep tissue injury and most of the dark discoloration seems to be working its way out at this point. There's no evidence of infection. Patient History Information obtained from Patient. Family History Cancer - Father, Diabetes - Siblings, Stroke - Mother, No family history of Heart Disease, Hereditary Spherocytosis, Hypertension, Kidney Disease, Lung Disease, Seizures, Thyroid Problems, Tuberculosis. Social History Never smoker, Marital Status - Widowed, Alcohol Use - Never, Drug Use - No History, Caffeine Use - Daily. Medical And Surgical History Notes Cardiovascular TIA Neurologic TIA Review of Systems (ROS) Constitutional Symptoms (General Health) JUSTEEN, HEHR. ([id number]) Denies complaints or symptoms of Fever, Chills. Respiratory The patient has no complaints or  symptoms. Cardiovascular The patient has no complaints or symptoms. Psychiatric The patient has no complaints or symptoms. Objective Constitutional Well-nourished and well-hydrated in no acute distress. Vitals Time Taken: 9:52 AM, Height: 65 in, Weight: 107 lbs, BMI: 17.8, Temperature: 98.2 F, Pulse: 74 bpm, Respiratory Rate: 16 breaths/min, Blood Pressure: 158/86 mmHg. Respiratory normal breathing without difficulty. Psychiatric this patient is able to make decisions and demonstrates good insight into disease process. Alert and Oriented x 3. pleasant and cooperative. General Notes: Patient has no pain at this point in her wound did require some debridement to remove slough over the surface of the wound she also did required to breeding away of the callous surrounding. She tolerated this without complication. Post debridement the wound bed appears to be doing much better. Integumentary (Hair, Skin) Wound #4 status is Open. Original cause of wound was Pressure Injury. The wound is located on the Left,Plantar Metatarsal head first. The wound measures 0.4cm length x 0.5cm width x 0.3cm depth; 0.157cm^2 area and 0.047cm^3 volume. There is Fat Layer (Subcutaneous Tissue) Exposed exposed. There is no tunneling noted, however, there is undermining starting at 8:00 and ending at 3:00 with a maximum distance of 0.4cm. There is a large amount of serosanguineous drainage noted.  The wound margin is indistinct and nonvisible. There is medium (34-66%) red granulation within the wound bed. There is a medium (34-66%) amount of necrotic tissue within the wound bed including Eschar and Adherent Slough. The periwound skin appearance exhibited: Callus. The periwound skin appearance did not exhibit: Crepitus, Excoriation, Induration, Rash, Scarring, Dry/Scaly, Maceration, Atrophie Blanche, Cyanosis, Ecchymosis, Hemosiderin Staining, Mottled, Pallor, Rubor, Erythema. Periwound temperature was noted as No  Abnormality. The periwound has tenderness on palpation. Wound #5 status is Healed - Epithelialized. Original cause of wound was Trauma. The wound is located on the Left Forearm. The wound measures 0cm length x 0cm width x 0cm depth; 0cm^2 area and 0cm^3 volume. Assessment Active Problems ALEESIA, HENNEY ([id number]) ICD-10 E11.621 - Type 2 diabetes mellitus with foot ulcer E11.40 - Type 2 diabetes mellitus with diabetic neuropathy, unspecified L97.522 - Non-pressure chronic ulcer of other part of left foot with fat layer exposed S51.802A - Unspecified open wound of left forearm, initial encounter L84 - Corns and callosities I10 - Essential (primary) hypertension Procedures Wound #4 Pre-procedure diagnosis of Wound #4 is a Pressure Ulcer located on the Left,Plantar Metatarsal head first . There was a Excisional Skin/Subcutaneous Tissue Debridement with a total area of 0.2 sq cm performed by STONE III, Carla Rashad E., PA-C. With the following instrument(s): Curette. to remove Viable and Non-Viable tissue/material Material removed includes Callus, Subcutaneous Tissue, and Slough, Fibrin/Exudate, and [place] after achieving pain control using Lidocaine 4% Topical Solution. No specimens were taken. A time out was conducted at 10:24, prior to the start of the procedure. A Minimum amount of bleeding was controlled with Pressure. The procedure was tolerated well with a pain level of 0 throughout and a pain level of 0 following the procedure. Patient s Level of Consciousness post procedure was recorded as Awake and Alert and post-procedure vitals were taken including Temperature: 97.8 F, Pulse: 74 bpm, Respiratory Rate: 18 breaths/min, Blood Pressure: (160)/(64) mmHg. Post Debridement Measurements: 0.5cm length x 0.6cm width x 0.3cm depth; 0.071cm^3 volume. Post debridement Stage noted as Category/Stage IV. Character of Wound/Ulcer Post Debridement requires further debridement. Post procedure Diagnosis Wound  #4: Same as Pre-Procedure Plan Wound Cleansing: Wound #4 Left,Plantar Metatarsal head first: Clean wound with Normal Saline. Cleanse wound with mild soap and water May Shower, gently pat wound dry prior to applying new dressing. Anesthetic (add to Medication List): Wound #4 Left,Plantar Metatarsal head first: Topical Lidocaine 4% cream applied to wound bed prior to debridement (In Clinic Only). Skin Barriers/Peri-Wound Care: Wound #4 Left,Plantar Metatarsal head first: Skin Prep Primary Wound Dressing: Wound #4 Left,Plantar Metatarsal head first: Silver Alginate Secondary Dressing: Wound #4 Left,Plantar Metatarsal head first: Boardered Foam Dressing - or [place] with foam for padding Foam Dressing Change Frequency: Wound #4 Left,Plantar Metatarsal head first: Change dressing every other day. Follow-up Appointments: LULANI, BOUR ([id number]) Wound #4 Left,Plantar Metatarsal head first: Return Appointment in 2 weeks. Additional Orders / Instructions: Wound #4 Left,Plantar Metatarsal head first: Increase protein intake. Activity as tolerated The following medication(s) was prescribed: lidocaine topical 4 % cream 1 1 cream topical was prescribed at facility I'm gonna suggest currently that we continue with the Current wound care measures for the next week since she seems to be doing well. She's been changing the dressings herself according to family and overall she seems to have done well in this regard to. Hopefully she will continue to show signs of improvement week by week I do think her orthotic shoes have been helpful in  this regard. Please see above for specific wound care orders. We will see patient for re-evaluation in 1 week(s) here in the clinic. If anything worsens or changes patient will contact our office for additional recommendations. Electronic Signature(s) Signed: 11/23/2017 5:56:33 PM By: Worthy Keeler PA-C Entered By: Worthy Keeler on 11/23/2017  12:41:07 Angela Patterson ([id number]) -------------------------------------------------------------------------------- ROS/PFSH Details Patient Name: Angela Patterson. Date of Service: 11/23/2017 9:30 AM Medical Record Number: [id number] Patient Account Number: [id number] Date of Birth/Sex: 09/28/1920 (84 y.o. F) Treating RN: Ahmed Prima Primary Care Provider: Einar Pheasant Other Clinician: Referring Provider: Einar Pheasant Treating Provider/Extender: Melburn Hake, Odus Clasby Weeks in Treatment: 10 Information Obtained From Patient Wound History Do you currently have one or more open woundso Yes How many open wounds do you currently haveo 1 Approximately how long have you had your woundso one month open How have you been treating your wound(s) until nowo wound cleanser Has your wound(s) ever healed and then re-openedo Yes Have you had any lab work done in the past montho No Have you tested positive for an antibiotic resistant organism (MRSA, VRE)o No Have you tested positive for osteomyelitis (bone infection)o No Have you had any tests for circulation on your legso No Constitutional Symptoms (General Health) Complaints and Symptoms: Negative for: Fever; Chills Eyes Medical History: Positive for: Cataracts - removed "a few years ago" (B ); Glaucoma Ear/Nose/Mouth/Throat Medical History: Negative for: Chronic sinus problems/congestion; Middle ear problems Hematologic/Lymphatic Medical History: Negative for: Anemia; Hemophilia; Human Immunodeficiency Virus; Lymphedema; Sickle Cell Disease Respiratory Complaints and Symptoms: No Complaints or Symptoms Medical History: Negative for: Aspiration; Asthma; Chronic Obstructive Pulmonary Disease (COPD); Pneumothorax; Sleep Apnea; Tuberculosis Cardiovascular Complaints and Symptoms: No Complaints or Symptoms Medical History: Positive for: Hypertension Past Medical History NotesMarland Kitchen JAMARRIA, REAL ([id number]) TIA Gastrointestinal Medical  History: Negative for: Cirrhosis ; Crohnos; Hepatitis A; Hepatitis B; Hepatitis C Endocrine Medical History: Positive for: Type II Diabetes Time with diabetes: 2 years Treated with: Oral agents, Diet Blood sugar tested every day: Yes Tested : twice Blood sugar testing results: Bedtime: 115 Genitourinary Medical History: Negative for: End Stage Renal Disease Immunological Medical History: Negative for: Lupus Erythematosus; Raynaudos; Scleroderma Integumentary (Skin) Medical History: Positive for: History of pressure wounds Negative for: History of Burn Musculoskeletal Medical History: Negative for: Gout; Rheumatoid Arthritis; Osteoarthritis; Osteomyelitis Neurologic Medical History: Positive for: Neuropathy Past Medical History Notes: TIA Oncologic Medical History: Negative for: Received Chemotherapy; Received Radiation Psychiatric Complaints and Symptoms: No Complaints or Symptoms HBO Extended History Items Eyes: Eyes: Cataracts Glaucoma ZANYIA, SILBAUGH ([id number]) Immunizations Pneumococcal Vaccine: Received Pneumococcal Vaccination: Yes Implantable Devices Family and Social History Cancer: Yes - Father; Diabetes: Yes - Siblings; Heart Disease: No; Hereditary Spherocytosis: No; Hypertension: No; Kidney Disease: No; Lung Disease: No; Seizures: No; Stroke: Yes - Mother; Thyroid Problems: No; Tuberculosis: No; Never smoker; Marital Status - Widowed; Alcohol Use: Never; Drug Use: No History; Caffeine Use: Daily; Financial Concerns: No; Food, Clothing or Shelter Needs: No; Support System Lacking: No; Transportation Concerns: No; Advanced Directives: Yes (Not Provided); Patient does not want information on Advanced Directives; Do not resuscitate: No; Living Will: Yes (Copy provided); Medical Power of Attorney: Yes - Olen Cordial- daughter (Copy provided) Physician Affirmation I have reviewed and agree with the above information. Electronic Signature(s) Signed:  11/23/2017 5:11:14 PM By: Alric Quan Signed: 11/23/2017 5:56:33 PM By: Worthy Keeler PA-C Entered By: Worthy Keeler on 11/23/2017 12:40:02 Angela Patterson ([id number]) -------------------------------------------------------------------------------- SuperBill Details Patient Name: [name]  M. Date of Service: 11/23/2017 Medical Record Number: [id number] Patient Account Number: [id number] Date of Birth/Sex: 11-04-1920 (84 y.o. F) Treating RN: Ahmed Prima Primary Care Provider: Einar Pheasant Other Clinician: Referring Provider: Einar Pheasant Treating Provider/Extender: Melburn Hake, Zakhia Seres Weeks in Treatment: 10 Diagnosis Coding ICD-10 Codes Code Description E11.621 Type 2 diabetes mellitus with foot ulcer E11.40 Type 2 diabetes mellitus with diabetic neuropathy, unspecified L97.522 Non-pressure chronic ulcer of other part of left foot with fat layer exposed S51.802A Unspecified open wound of left forearm, initial encounter L84 Corns and callosities I10 Essential (primary) hypertension Facility Procedures CPT4 Code: [id number] Description: 00762 - DEB SUBQ TISSUE 20 SQ CM/< ICD-10 Diagnosis Description L97.522 Non-pressure chronic ulcer of other part of left foot with fat Modifier: layer exposed Quantity: 1 Physician Procedures CPT4 Code: [id number] Description: 56256 - WC PHYS SUBQ TISS 20 SQ CM ICD-10 Diagnosis Description L97.522 Non-pressure chronic ulcer of other part of left foot with fat Modifier: layer exposed Quantity: 1 Electronic Signature(s) Signed: 11/23/2017 5:56:33 PM By: Worthy Keeler PA-C Entered By: Worthy Keeler on 11/23/2017 12:41:16

## 2017-12-07 ENCOUNTER — Encounter: Payer: Medicare Other | Admitting: Physician Assistant

## 2017-12-07 DIAGNOSIS — Z8673 Personal history of transient ischemic attack (TIA), and cerebral infarction without residual deficits: Secondary | ICD-10-CM | POA: Diagnosis not present

## 2017-12-07 DIAGNOSIS — L89894 Pressure ulcer of other site, stage 4: Secondary | ICD-10-CM | POA: Diagnosis not present

## 2017-12-07 DIAGNOSIS — L97522 Non-pressure chronic ulcer of other part of left foot with fat layer exposed: Secondary | ICD-10-CM | POA: Diagnosis not present

## 2017-12-07 DIAGNOSIS — E1140 Type 2 diabetes mellitus with diabetic neuropathy, unspecified: Secondary | ICD-10-CM | POA: Diagnosis not present

## 2017-12-07 DIAGNOSIS — E11621 Type 2 diabetes mellitus with foot ulcer: Secondary | ICD-10-CM | POA: Diagnosis not present

## 2017-12-07 DIAGNOSIS — I10 Essential (primary) hypertension: Secondary | ICD-10-CM | POA: Diagnosis not present

## 2017-12-08 ENCOUNTER — Other Ambulatory Visit (INDEPENDENT_AMBULATORY_CARE_PROVIDER_SITE_OTHER): Payer: Medicare Other

## 2017-12-08 ENCOUNTER — Encounter: Payer: Self-pay | Admitting: Internal Medicine

## 2017-12-08 DIAGNOSIS — E7800 Pure hypercholesterolemia, unspecified: Secondary | ICD-10-CM | POA: Diagnosis not present

## 2017-12-08 DIAGNOSIS — E1140 Type 2 diabetes mellitus with diabetic neuropathy, unspecified: Secondary | ICD-10-CM

## 2017-12-08 DIAGNOSIS — K625 Hemorrhage of anus and rectum: Secondary | ICD-10-CM

## 2017-12-08 DIAGNOSIS — I10 Essential (primary) hypertension: Secondary | ICD-10-CM

## 2017-12-08 DIAGNOSIS — L97509 Non-pressure chronic ulcer of other part of unspecified foot with unspecified severity: Secondary | ICD-10-CM | POA: Diagnosis not present

## 2017-12-08 LAB — CBC WITH DIFFERENTIAL/PLATELET
Basophils Absolute: 0.1 10*3/uL (ref 0.0–0.1)
Basophils Relative: 0.7 % (ref 0.0–3.0)
Eosinophils Absolute: 0.1 10*3/uL (ref 0.0–0.7)
Eosinophils Relative: 1.5 % (ref 0.0–5.0)
HCT: 44.6 % (ref 36.0–46.0)
Hemoglobin: 15.1 g/dL — ABNORMAL HIGH (ref 12.0–15.0)
Lymphocytes Relative: 24.5 % (ref 12.0–46.0)
Lymphs Abs: 2 10*3/uL (ref 0.7–4.0)
MCHC: 33.8 g/dL (ref 30.0–36.0)
MCV: 95.9 fl (ref 78.0–100.0)
Monocytes Absolute: 0.6 10*3/uL (ref 0.1–1.0)
Monocytes Relative: 7.4 % (ref 3.0–12.0)
Neutro Abs: 5.5 10*3/uL (ref 1.4–7.7)
Neutrophils Relative %: 65.9 % (ref 43.0–77.0)
Platelets: 150 10*3/uL (ref 150.0–400.0)
RBC: 4.65 Mil/uL (ref 3.87–5.11)
RDW: 12.9 % (ref 11.5–15.5)
WBC: 8.3 10*3/uL (ref 4.0–10.5)

## 2017-12-08 LAB — HEPATIC FUNCTION PANEL
ALT: 12 U/L (ref 0–35)
AST: 17 U/L (ref 0–37)
Albumin: 4.2 g/dL (ref 3.5–5.2)
Alkaline Phosphatase: 59 U/L (ref 39–117)
Bilirubin, Direct: 0.1 mg/dL (ref 0.0–0.3)
Total Bilirubin: 0.8 mg/dL (ref 0.2–1.2)
Total Protein: 7.6 g/dL (ref 6.0–8.3)

## 2017-12-08 LAB — LIPID PANEL
Cholesterol: 173 mg/dL (ref 0–200)
HDL: 44.9 mg/dL (ref >39.00)
LDL Cholesterol: 90 mg/dL (ref 0–99)
NonHDL: 128.48
Total CHOL/HDL Ratio: 4
Triglycerides: 194 mg/dL — ABNORMAL HIGH (ref 0.0–149.0)
VLDL: 38.8 mg/dL (ref 0.0–40.0)

## 2017-12-08 LAB — HEMOGLOBIN A1C: Hgb A1c MFr Bld: 6.8 % — ABNORMAL HIGH (ref 4.6–6.5)

## 2017-12-08 LAB — BASIC METABOLIC PANEL
BUN: 15 mg/dL (ref 6–23)
CO2: 31 mEq/L (ref 19–32)
Calcium: 9.7 mg/dL (ref 8.4–10.5)
Chloride: 103 mEq/L (ref 96–112)
Creatinine, Ser: 0.65 mg/dL (ref 0.40–1.20)
GFR: 90.07 mL/min (ref >60.00)
Glucose, Bld: 155 mg/dL — ABNORMAL HIGH (ref 70–99)
Potassium: 4.5 mEq/L (ref 3.5–5.1)
Sodium: 140 mEq/L (ref 135–145)

## 2017-12-08 NOTE — Progress Notes (Signed)
LISIA, WESTBAY ([id number]) Visit Report for 12/07/2017 Arrival Information Details Patient Name: Angela Patterson, Angela Patterson. Date of Service: 12/07/2017 10:00 AM Medical Record Number: [id number] Patient Account Number: [id number] Date of Birth/Sex: 12-03-1920 (84 y.o. F) Treating RN: Montey Hora Primary Care Quita Mcgrory: Einar Pheasant Other Clinician: Referring Mashelle Busick: Einar Pheasant Treating Bhargav Barbaro/Extender: Melburn Hake, HOYT Weeks in Treatment: 12 Visit Information History Since Last Visit Added or deleted any medications: No Patient Arrived: Ambulatory Any new allergies or adverse reactions: No Arrival Time: 09:56 Had a fall or experienced change in No Accompanied By: dtr activities of daily living that may affect Transfer Assistance: None risk of falls: Patient Identification Verified: Yes Signs or symptoms of abuse/neglect since last visito No Secondary Verification Process Completed: Yes Hospitalized since last visit: No Patient Requires Transmission-Based No Implantable device outside of the clinic excluding No Precautions: cellular tissue based products placed in the center Patient Has Alerts: Yes since last visit: Patient Alerts: DMII Has Dressing in Place as Prescribed: Yes Pain Present Now: No Electronic Signature(s) Signed: 12/07/2017 4:45:01 PM By: Montey Hora Entered By: Montey Hora on 12/07/2017 09:58:30 Angela Patterson ([id number]) -------------------------------------------------------------------------------- Encounter Discharge Information Details Patient Name: Angela Patterson. Date of Service: 12/07/2017 10:00 AM Medical Record Number: [id number] Patient Account Number: [id number] Date of Birth/Sex: Apr 08, 1921 (84 y.o. F) Treating RN: Cornell Barman Primary Care Rosaire Cueto: Einar Pheasant Other Clinician: Referring Efrat Zuidema: Einar Pheasant Treating Tonda Wiederhold/Extender: Worthy Keeler Weeks in Treatment: 12 Encounter Discharge Information Items Discharge  Condition: Stable Ambulatory Status: Ambulatory Discharge Destination: Home Transportation: Private Auto Accompanied By: daughter Schedule Follow-up Appointment: Yes Clinical Summary of Care: Electronic Signature(s) Signed: 12/07/2017 5:25:47 PM By: Gretta Cool, BSN, RN, CWS, Kim RN, BSN Entered By: Gretta Cool, BSN, RN, CWS, Kim on 12/07/2017 10:37:08 Angela Patterson ([id number]) -------------------------------------------------------------------------------- Lower Extremity Assessment Details Patient Name: Angela Patterson. Date of Service: 12/07/2017 10:00 AM Medical Record Number: [id number] Patient Account Number: [id number] Date of Birth/Sex: 12-Jun-1921 (84 y.o. F) Treating RN: Montey Hora Primary Care Latresha Yahr: Einar Pheasant Other Clinician: Referring Destaney Sarkis: Einar Pheasant Treating Tarence Searcy/Extender: Melburn Hake, HOYT Weeks in Treatment: 12 Edema Assessment Assessed: [Left: No] [Right: No] Edema: [Left: No] [Right: No] Vascular Assessment Pulses: Dorsalis Pedis Palpable: [Left:Yes] Posterior Tibial Extremity colors, hair growth, and conditions: Extremity Color: [Left:Hyperpigmented] Hair Growth on Extremity: [Left:No] Temperature of Extremity: [Left:Warm] Capillary Refill: [Left:< 3 seconds] Toe Nail Assessment Left: Right: Thick: Yes Discolored: Yes Deformed: Yes Improper Length and Hygiene: No Electronic Signature(s) Signed: 12/07/2017 4:45:01 PM By: Montey Hora Entered By: Montey Hora on 12/07/2017 10:05:02 Angela Patterson ([id number]) -------------------------------------------------------------------------------- Multi Wound Chart Details Patient Name: Angela Patterson. Date of Service: 12/07/2017 10:00 AM Medical Record Number: [id number] Patient Account Number: [id number] Date of Birth/Sex: 1920-09-26 (84 y.o. F) Treating RN: Cornell Barman Primary Care Shekelia Boutin: Einar Pheasant Other Clinician: Referring Daylani Deblois: Einar Pheasant Treating Alonna Bartling/Extender: Melburn Hake, HOYT Weeks in Treatment: 12 Vital Signs Height(in): 65 Pulse(bpm): 75 Weight(lbs): 107 Blood Pressure(mmHg): 167/69 Body Mass Index(BMI): 18 Temperature(F): 98.2 Respiratory Rate 18 (breaths/min): Photos: [4:No Photos] [N/A:N/A] Wound Location: [4:Left Metatarsal head first - Plantar] [N/A:N/A] Wounding Event: [4:Pressure Injury] [N/A:N/A] Primary Etiology: [4:Pressure Ulcer] [N/A:N/A] Comorbid History: [4:Cataracts, Glaucoma, Hypertension, Type II Diabetes, History of pressure wounds, Neuropathy] [N/A:N/A] Date Acquired: [4:08/20/2017] [N/A:N/A] Weeks of Treatment: [4:12] [N/A:N/A] Wound Status: [4:Open] [N/A:N/A] Measurements L x W x D [4:0.4x0.4x0.3] [N/A:N/A] (cm) Area (cm) : [4:0.126] [N/A:N/A] Volume (cm) : [4:0.038] [N/A:N/A] % Reduction in Area: [4:92.90%] [N/A:N/A] % Reduction in Volume: [4:95.70%] [N/A:N/A] Classification: [  4:Category/Stage IV] [N/A:N/A] Exudate Amount: [4:Medium] [N/A:N/A] Exudate Type: [4:Serous] [N/A:N/A] Exudate Color: [4:amber] [N/A:N/A] Wound Margin: [4:Indistinct, nonvisible] [N/A:N/A] Granulation Amount: [4:Large (67-100%)] [N/A:N/A] Granulation Quality: [4:Red] [N/A:N/A] Necrotic Amount: [4:Small (1-33%)] [N/A:N/A] Exposed Structures: [4:Fat Layer (Subcutaneous Tissue) Exposed: Yes Fascia: No Tendon: No Muscle: No Joint: No Bone: No] [N/A:N/A] Epithelialization: [4:None] [N/A:N/A] Periwound Skin Texture: [4:Callus: Yes Excoriation: No Induration: No] [N/A:N/A] Crepitus: No Rash: No Scarring: No Periwound Skin Moisture: Maceration: No N/A N/A Dry/Scaly: No Periwound Skin Color: Atrophie Blanche: No N/A N/A Cyanosis: No Ecchymosis: No Erythema: No Hemosiderin Staining: No Mottled: No Pallor: No Rubor: No Temperature: No Abnormality N/A N/A Tenderness on Palpation: Yes N/A N/A Wound Preparation: Ulcer Cleansing: N/A N/A Rinsed/Irrigated with Saline Topical Anesthetic Applied: Other: lidocaine 4% Treatment  Notes Electronic Signature(s) Signed: 12/07/2017 5:25:47 PM By: Gretta Cool, BSN, RN, CWS, Kim RN, BSN Entered By: Gretta Cool, BSN, RN, CWS, Kim on 12/07/2017 10:15:28 Angela Patterson ([id number]) -------------------------------------------------------------------------------- [place] Details Patient Name: JAMITA, MCKELVIN. Date of Service: 12/07/2017 10:00 AM Medical Record Number: [id number] Patient Account Number: [id number] Date of Birth/Sex: [birth year] (84 y.o. F) Treating RN: Cornell Barman Primary Care Zaraya Delauder: Einar Pheasant Other Clinician: Referring Neveyah Garzon: Einar Pheasant Treating Dollie Bressi/Extender: Melburn Hake, HOYT Weeks in Treatment: 12 Active Inactive ` Abuse / Safety / Falls / Self Care Management Nursing Diagnoses: Potential for falls Goals: Patient will not experience any injury related to falls Date Initiated: 09/10/2017 Target Resolution Date: 11/27/2017 Goal Status: Active Interventions: Assess fall risk on admission and as needed Notes: ` Orientation to the Wound Care Program Nursing Diagnoses: Knowledge deficit related to the wound healing center program Goals: Patient/caregiver will verbalize understanding of the [facility] Date Initiated: 09/10/2017 Target Resolution Date: 11/27/2017 Goal Status: Active Interventions: Provide education on orientation to the wound center Notes: ` Wound/Skin Impairment Nursing Diagnoses: Impaired tissue integrity Goals: Ulcer/skin breakdown will heal within 14 weeks Date Initiated: 09/10/2017 Target Resolution Date: 11/27/2017 Goal Status: Active Interventions: LANELL, CARPENTER ([id number]) Assess patient/caregiver ability to obtain necessary supplies Assess patient/caregiver ability to perform ulcer/skin care regimen upon admission and as needed Assess ulceration(s) every visit Notes: Electronic Signature(s) Signed: 12/07/2017 5:25:47 PM By: Gretta Cool, BSN, RN, CWS, Kim RN, BSN Entered By: Gretta Cool,  BSN, RN, CWS, Kim on 12/07/2017 10:15:15 Angela Patterson ([id number]) -------------------------------------------------------------------------------- Pain Assessment Details Patient Name: Angela Patterson. Date of Service: 12/07/2017 10:00 AM Medical Record Number: [id number] Patient Account Number: [id number] Date of Birth/Sex: 1921/02/13 (84 y.o. F) Treating RN: Montey Hora Primary Care Ensley Blas: Einar Pheasant Other Clinician: Referring Santez Woodcox: Einar Pheasant Treating Derak Schurman/Extender: Melburn Hake, HOYT Weeks in Treatment: 12 Active Problems Location of Pain Severity and Description of Pain Patient Has Paino No Site Locations Pain Management and Medication Current Pain Management: Electronic Signature(s) Signed: 12/07/2017 4:45:01 PM By: Montey Hora Entered By: Montey Hora on 12/07/2017 09:58:46 Angela Patterson ([id number]) -------------------------------------------------------------------------------- Patient/Caregiver Education Details Patient Name: Angela Patterson. Date of Service: 12/07/2017 10:00 AM Medical Record Number: [id number] Patient Account Number: [id number] Date of Birth/Gender: Sep 22, 1920 (84 y.o. F) Treating RN: Cornell Barman Primary Care Physician: Einar Pheasant Other Clinician: Referring Physician: Einar Pheasant Treating Physician/Extender: Sharalyn Ink in Treatment: 12 Education Assessment Education Provided To: Patient Education Topics Provided Wound/Skin Impairment: Handouts: Caring for Your Ulcer, Other: continue wound care as prescribed Methods: Demonstration, Explain/Verbal Responses: State content correctly Electronic Signature(s) Signed: 12/07/2017 5:25:47 PM By: Gretta Cool, BSN, RN, CWS, Kim RN, BSN Entered By: Gretta Cool, BSN, RN, CWS, Kim on 12/07/2017  10:37:31 TINAMARIE, PRZYBYLSKI ([id number]) -------------------------------------------------------------------------------- Wound Assessment Details Patient Name: KAJOL, CRISPEN. Date of  Service: 12/07/2017 10:00 AM Medical Record Number: [id number] Patient Account Number: [id number] Date of Birth/Sex: 01/29/1921 (84 y.o. F) Treating RN: Montey Hora Primary Care Shalimar Mcclain: Einar Pheasant Other Clinician: Referring Talyssa Gibas: Einar Pheasant Treating Jamita Mckelvin/Extender: Melburn Hake, HOYT Weeks in Treatment: 12 Wound Status Wound Number: 4 Primary Pressure Ulcer Etiology: Wound Location: Left Metatarsal head first - Plantar Wound Open Wounding Event: Pressure Injury Status: Date Acquired: 08/20/2017 Comorbid Cataracts, Glaucoma, Hypertension, Type II Weeks Of Treatment: 12 History: Diabetes, History of pressure wounds, Clustered Wound: No Neuropathy Photos Photo Uploaded By: Montey Hora on 12/07/2017 10:53:14 Wound Measurements Length: (cm) 0.4 Width: (cm) 0.4 Depth: (cm) 0.3 Area: (cm) 0.126 Volume: (cm) 0.038 % Reduction in Area: 92.9% % Reduction in Volume: 95.7% Epithelialization: None Tunneling: No Undermining: No Wound Description Classification: Category/Stage IV Wound Margin: Indistinct, nonvisible Exudate Amount: Medium Exudate Type: Serous Exudate Color: amber Foul Odor After Cleansing: No Slough/Fibrino No Wound Bed Granulation Amount: Large (67-100%) Exposed Structure Granulation Quality: Red Fascia Exposed: No Necrotic Amount: Small (1-33%) Fat Layer (Subcutaneous Tissue) Exposed: Yes Necrotic Quality: Adherent Slough Tendon Exposed: No Muscle Exposed: No Joint Exposed: No Bone Exposed: No Periwound Skin Texture TATEM, HOLSONBACK. ([id number]) Texture Color No Abnormalities Noted: No No Abnormalities Noted: No Callus: Yes Atrophie Blanche: No Crepitus: No Cyanosis: No Excoriation: No Ecchymosis: No Induration: No Erythema: No Rash: No Hemosiderin Staining: No Scarring: No Mottled: No Pallor: No Moisture Rubor: No No Abnormalities Noted: No Dry / Scaly: No Temperature / Pain Maceration: No Temperature: No  Abnormality Tenderness on Palpation: Yes Wound Preparation Ulcer Cleansing: Rinsed/Irrigated with Saline Topical Anesthetic Applied: Other: lidocaine 4%, Treatment Notes Wound #4 (Left, Plantar Metatarsal head first) 1. Cleansed with: Clean wound with Normal Saline 2. Anesthetic Topical Lidocaine 4% cream to wound bed prior to debridement 4. Dressing Applied: Other dressing (specify in notes) 5. Secondary Dressing Applied Bordered Foam Dressing Notes endoform Electronic Signature(s) Signed: 12/07/2017 4:45:01 PM By: Montey Hora Entered By: Montey Hora on 12/07/2017 10:03:43 Angela Patterson ([id number]) -------------------------------------------------------------------------------- [place] Details Patient Name: Angela Patterson. Date of Service: 12/07/2017 10:00 AM Medical Record Number: [id number] Patient Account Number: [id number] Date of Birth/Sex: [birth year] (84 y.o. F) Treating RN: Montey Hora Primary Care Ryoma Nofziger: Einar Pheasant Other Clinician: Referring Shannan Slinker: Einar Pheasant Treating [name]/Extender: Melburn Hake, HOYT Weeks in Treatment: 12 Vital Signs Time Taken: 10:01 Temperature (F): 98.2 Height (in): 65 Pulse (bpm): 75 Weight (lbs): 107 Respiratory Rate (breaths/min): 18 Body Mass Index (BMI): 17.8 Blood Pressure (mmHg): 167/69 Reference Range: 80 - 120 mg / dl Electronic Signature(s) Signed: 12/07/2017 4:45:01 PM By: Montey Hora Entered By: Montey Hora on 12/07/2017 10:02:22

## 2017-12-08 NOTE — Progress Notes (Signed)
CANDAS, DEEMER ([id number]) Visit Report for 12/07/2017 Chief Complaint Document Details Patient Name: Angela Patterson, RUEGER. Date of Service: 12/07/2017 10:00 AM Medical Record Number: [id number] Patient Account Number: [id number] Date of Birth/Sex: Jun 10, 1921 (84 y.o. F) Treating RN: Cornell Barman Primary Care Provider: Einar Pheasant Other Clinician: Referring Provider: Einar Pheasant Treating Provider/Extender: Melburn Hake, Aarti Mankowski Weeks in Treatment: 12 Information Obtained from: Patient Chief Complaint Left plantar foot ulcer Electronic Signature(s) Signed: 12/07/2017 6:53:41 PM By: Worthy Keeler PA-C Entered By: Worthy Keeler on 12/07/2017 10:14:14 Angela Patterson ([id number]) -------------------------------------------------------------------------------- Debridement Details Patient Name: Angela Patterson. Date of Service: 12/07/2017 10:00 AM Medical Record Number: [id number] Patient Account Number: [id number] Date of Birth/Sex: 02/02/1921 (84 y.o. F) Treating RN: Cornell Barman Primary Care Provider: Einar Pheasant Other Clinician: Referring Provider: Einar Pheasant Treating Provider/Extender: Melburn Hake, Britny Riel Weeks in Treatment: 12 Debridement Performed for Wound #4 Left,Plantar Metatarsal head first Assessment: Performed By: Physician STONE III, Ilya Ess E., PA-C Debridement Type: Debridement Pre-procedure Verification/Time Yes - 10:15 Out Taken: Start Time: 10:15 Pain Control: Other : lidocaine 4% Total Area Debrided (L x W): 0.4 (cm) x 0.4 (cm) = 0.16 (cm) Tissue and other material Viable, Non-Viable, Callus, Subcutaneous, Biofilm debrided: Level: Skin/Subcutaneous Tissue Debridement Description: Excisional Instrument: Curette Bleeding: Minimum Hemostasis Achieved: Pressure End Time: 10:18 Procedural Pain: 1 Post Procedural Pain: 0 Response to Treatment: Procedure was tolerated well Level of Consciousness: Awake and Alert Post Procedure Vitals: Temperature: 98.2 Pulse:  75 Respiratory Rate: 18 Blood Pressure: Systolic Blood Pressure: 109 Diastolic Blood Pressure: 69 Post Debridement Measurements of Total Wound Length: (cm) 0.4 Stage: Category/Stage IV Width: (cm) 0.4 Depth: (cm) 0.3 Volume: (cm) 0.038 Character of Wound/Ulcer Post Stable Debridement: Post Procedure Diagnosis Same as Pre-procedure Electronic Signature(s) Signed: 12/07/2017 5:25:47 PM By: Gretta Cool, BSN, RN, CWS, Kim RN, BSN Signed: 12/07/2017 6:53:41 PM By: Worthy Keeler PA-C Entered By: Gretta Cool, BSN, RN, CWS, Kim on 12/07/2017 10:18:15 Angela Patterson ([name] ([id number]) -------------------------------------------------------------------------------- HPI Details Patient Name: Angela Patterson. Date of Service: 12/07/2017 10:00 AM Medical Record Number: [id number] Patient Account Number: [id number] Date of Birth/Sex: 1921/05/19 (84 y.o. F) Treating RN: Cornell Barman Primary Care Provider: Einar Pheasant Other Clinician: Referring Provider: Einar Pheasant Treating Provider/Extender: Melburn Hake, Dellie Piasecki Weeks in Treatment: 12 History of Present Illness HPI Description: this very pleasant 84 year old patient who is extremely active and ambulating all day has had problems with the left foot for over a year. Her daughter who is her caregiver says she drives around and even goes around and does her chores by herself. this is the third recurrence and after the last time we saw in June and discharged her her daughter has got her to pace of diabetic shoes and she's been wearing these regularly. She has been on the shoes for about 6 months now. Her diabetes is under control and her blood sugars run from a anywhere between 110 to 130 and she checks them twice a day. no fever or discharge. During her initial visit, an x-ray of her left foot was done on 09/26/2014. There was no cortical erosion noted there was mild osteopenia in the distal first metatarsal. Radiologist has  recommended a MRI of the left foot to exclude osteomyelitis. MRI done on 10/09/2014 reveals that the area where she had a phlegmon is without osteomyelitis. the bone marrow and other areas are compatible with reactive edema rather than osteomyelitis. 08/06/2015 -- x-ray of the left foot done on 07/30/2015 shows IMPRESSION:Soft tissue  wound with a bandage is noted over the plantar aspect of the distal left foot. Diffuse osteopenia degenerative change. No acute bony abnormality 09/16/2015 -- last week she had gone to [facility] and met with Harrington Challenger who had spoken to me over the phone. Instead of a DH walking boot he has given her an offloading boot which will make her more steady and this boot is called DH healing sandal with a custom-made insole to offload this area. Readmission: 09/10/17 on evaluation today patient presents for reevaluation in regard to her left plantar foot ulcer has actually been since 2017 since we have last seen her. With that being said she has seem to do fairly well in the interim since that time. Patient is seen with both her daughter as well as her son present for today's evaluation. Her son tells me that they have ordered diabetic shoes for her which obviously are custom-made and that they were hoping these would be available today to bring in. With that being said she does also have some of the boots left that she had previously for offloading and they brought those with them today as well. With that being said up to this point patient has just been using her tennis shoes in regard to her footwear. She has had drainage from the ulcer and it really does not seem to be improving significantly at this time. She does have a history of diabetes mellitus type II which she is on medication and she is also on a blood thinner. Fortunately she does not seem to have any evidence of discomfort due to the diabetic neuropathy. Patient does have a very high  arch which is why she seems to get pressure to this area of the first metatarsal on the left foot leading to the ulcerations that develop. The current ulceration is in the exact location of her prior ulceration. She has not had a recent x-ray. 09/17/17 On evaluation today patient appears to be doing well in regard to her left plantar foot ulcer. Since last week she has had excellent filling in of the wound and this is definitely much smaller than previously noted. With that being said she is having no pain she has neuropathy and therefore does not feel any pain anyway. There's no evidence of infection which is great news. She does have callous but again this is not nearly as significant as previously noted. Overall I'm definitely pleased with how things stand today. 09/24/17 on evaluation today patient's left plantar foot ulcer appears to show signs of good improvement compared even to last week's evaluation. She is slowly healing and again from the first time I saw this this definitely appears to be doing much better. She's not have any discomfort which is great news unfortunately most of this is actually due to the fact that she has neuropathy. Other than that however she has been tolerating the dressing changes without complication she actually performs the dressing changes herself. AAYLAH, POKORNY ([id number]) 10/08/17 on evaluation today patient's foot ulcer on the right actually appears to be doing a little bit worse unfortunately. She has been tolerating the dressing changes fairly well she performs these herself. Although I'm unsure that she has really been packing the alginate into the wound I have discussed this with her before but it sounds as if she may not be. Nonetheless I did have a discussion concerning this again today with the daughter present as well as her son and other  daughter whom I just met today. Patient did get her custom shoes which finally arrived which is definitely good  news. These do appear to be excellent I did check them out in the offloading as well as our support is great. 10/15/17 on evaluation today patient appears to be doing little better compared to last week's evaluation. She does have a little bit of bruising on the lateral border of the wound which seems to be indicative of some pressure occurring at that site. With that being said this is most likely as a result of the dressing slipping apparently with the new shoes this is not been staying in place as well. That's one thing we are going to have to continue to keep an eye on and see if there's any issues in that regard. Nonetheless we may need to go back to the offloading boots until we get this healed and then have her transfer back to her custom shoes depend on how things progress. Nonetheless at this time I do not see any definite or permanent damage and again she does seem to be doing a little bit better in my opinion. 10/22/17 on evaluation today patient actually appears to be doing rather well in regard to her left foot ulcer. The dressing has been staying on better which is good news. Subsequently the patient also has been utilizing her diabetic shoes with good result as well. Overall I think it is helpful for her now that the dressings are staying in place there appears to be no new injury and the wound of the ball is showing signs of improving. 11/09/17 on evaluation today patient appears to be doing better in regard to her left plantar foot ulcer. She has much less callous buildup this week compared to last time that I evaluated her two weeks prior. Overall I feel like this is definitely showing signs of improvement which is good news. There does not appear to be any new injury to the foot which is also excellent news. Overall I feel like that her shoes for offloading are doing in great job. Overall I'm pleased with the progress. She does have an area on her left forearm which has previously  been evaluated at the walk-in urgent care. She states that it seems to have done pretty well but her son would like for [place] to evaluate this further and ensure there is nothing that appears to be infected or otherwise causing her trouble. 11/23/17 on evaluation today patient's wound actually appears to be doing fairly well at this point. She has been tolerating the dressing changes without complication. She does seem to be making some progress in regard to the overall quality and size of the wound in my opinion although she still develop some callous there is no evidence of deep tissue injury and most of the dark discoloration seems to be working its way out at this point. There's no evidence of infection. 12/07/17 on evaluation today patient appears to be doing great in regard to her left foot ulcer. Unfortunately the only thing I see that's an issue at this point is she's not really noting a lot of granulation and filling in at this time which I think we need to work on. This may require dressing change I don't think she needs the alginate anymore she's not having as much drainage as she had in the past. Therefore we're gonna see about doing something different as of today. Electronic Signature(s) Signed: 12/07/2017 6:53:41 PM By: Melburn Hake,  Bonetta Mostek PA-C Entered By: Worthy Keeler on 12/07/2017 10:27:58 Angela Patterson ([id number]) -------------------------------------------------------------------------------- Physical Exam Details Patient Name: NIKYA, BUSLER. Date of Service: 12/07/2017 10:00 AM Medical Record Number: [id number] Patient Account Number: [id number] Date of Birth/Sex: [birth year] (84 y.o. F) Treating RN: Cornell Barman Primary Care Provider: Einar Pheasant Other Clinician: Referring Provider: Einar Pheasant Treating Provider/Extender: Melburn Hake, Lucy Woolever Weeks in Treatment: 27 Constitutional Well-nourished and well-hydrated in no acute distress. Respiratory normal breathing without  difficulty. Psychiatric this patient is able to make decisions and demonstrates good insight into disease process. Alert and Oriented x 3. pleasant and cooperative. Notes At this time patient's wound shows evidence of a little bit of slough and biofilm on the surface of the wound fortunately there does not appear to be any evidence of infection which is good news. She does have some callous that was worked on around [employer] of the wound today although this is minimal compared to how this was previous. I do believe her diabetic shoes are helpful for her. Electronic Signature(s) Signed: 12/07/2017 6:53:41 PM By: Worthy Keeler PA-C Entered By: Worthy Keeler on 12/07/2017 10:28:48 Angela Patterson ([id number]) -------------------------------------------------------------------------------- Physician Orders Details Patient Name: Angela Patterson. Date of Service: 12/07/2017 10:00 AM Medical Record Number: [id number] Patient Account Number: [id number] Date of Birth/Sex: Jun 08, 1921 (84 y.o. F) Treating RN: Cornell Barman Primary Care Provider: Einar Pheasant Other Clinician: Referring Provider: Einar Pheasant Treating Provider/Extender: Melburn Hake, Lasonja Lakins Weeks in Treatment: 12 Verbal / Phone Orders: No Diagnosis Coding ICD-10 Coding Code Description E11.621 Type 2 diabetes mellitus with foot ulcer E11.40 Type 2 diabetes mellitus with diabetic neuropathy, unspecified L97.522 Non-pressure chronic ulcer of other part of left foot with fat layer exposed S51.802A Unspecified open wound of left forearm, initial encounter L84 Corns and callosities I10 Essential (primary) hypertension Wound Cleansing Wound #4 Left,Plantar Metatarsal head first o Clean wound with Normal Saline. o Cleanse wound with mild soap and water o May Shower, gently pat wound dry prior to applying new dressing. Anesthetic (add to Medication List) Wound #4 Left,Plantar Metatarsal head first o Topical Lidocaine 4%  cream applied to wound bed prior to debridement (In Clinic Only). Skin Barriers/Peri-Wound Care Wound #4 Left,Plantar Metatarsal head first o Skin Prep Primary Wound Dressing Wound #4 Left,Plantar Metatarsal head first o Other: - Endoform Secondary Dressing Wound #4 Left,Plantar Metatarsal head first o Boardered Foam Dressing - or [place] with foam for padding o Foam Dressing Change Frequency Wound #4 Left,Plantar Metatarsal head first o Change dressing every other day. Follow-up Appointments Wound #4 Left,Plantar Metatarsal head first o Return Appointment in 2 weeks. ARLYCE, CIRCLE ([id number]) Additional Orders / Instructions Wound #4 Left,Plantar Metatarsal head first o Increase protein intake. o Activity as tolerated Electronic Signature(s) Signed: 12/07/2017 5:25:47 PM By: Gretta Cool, BSN, RN, CWS, Kim RN, BSN Signed: 12/07/2017 6:53:41 PM By: Worthy Keeler PA-C Entered By: Gretta Cool, BSN, RN, CWS, Kim on 12/07/2017 10:21:32 Angela Patterson ([id number]) -------------------------------------------------------------------------------- Problem List Details Patient Name: VAIDEHI, BRADDY. Date of Service: 12/07/2017 10:00 AM Medical Record Number: [id number] Patient Account Number: [id number] Date of Birth/Sex: September 15, 1920 (84 y.o. F) Treating RN: Cornell Barman Primary Care Provider: Einar Pheasant Other Clinician: Referring Provider: Einar Pheasant Treating Provider/Extender: Melburn Hake, Amariz Flamenco Weeks in Treatment: 12 Active Problems ICD-10 Impacting Encounter Code Description Active Date Wound Healing Diagnosis E11.621 Type 2 diabetes mellitus with foot ulcer 09/10/2017 Yes E11.40 Type 2 diabetes mellitus with diabetic neuropathy, 09/10/2017 Yes unspecified L97.522 Non-pressure chronic  ulcer of other part of left foot with fat 09/10/2017 Yes layer exposed S51.802A Unspecified open wound of left forearm, initial encounter 11/09/2017 Yes L84 Corns and callosities  09/10/2017 Yes I10 Essential (primary) hypertension 09/10/2017 Yes Inactive Problems Resolved Problems Electronic Signature(s) Signed: 12/07/2017 6:53:41 PM By: Worthy Keeler PA-C Entered By: Worthy Keeler on 12/07/2017 10:14:05 Angela Patterson ([id number]) -------------------------------------------------------------------------------- Progress Note Details Patient Name: Angela Patterson. Date of Service: 12/07/2017 10:00 AM Medical Record Number: [id number] Patient Account Number: [id number] Date of Birth/Sex: [birth year] (84 y.o. F) Treating RN: Cornell Barman Primary Care Provider: Einar Pheasant Other Clinician: Referring Provider: Einar Pheasant Treating Provider/Extender: Melburn Hake, Dominion Kathan Weeks in Treatment: 12 Subjective Chief Complaint Information obtained from Patient Left plantar foot ulcer History of Present Illness (HPI) this very pleasant 84 year old patient who is extremely active and ambulating all day has had problems with the left foot for over a year. Her daughter who is her caregiver says she drives around and even goes around and does her chores by herself. this is the third recurrence and after the last time we saw in June and discharged her her daughter has got her to pace of diabetic shoes and she's been wearing these regularly. She has been on the shoes for about 6 months now. Her diabetes is under control and her blood sugars run from a anywhere between 110 to 130 and she checks them twice a day. no fever or discharge. During her initial visit, an x-ray of her left foot was done on 09/26/2014. There was no cortical erosion noted there was mild osteopenia in the distal first metatarsal. Radiologist has recommended a MRI of the left foot to exclude osteomyelitis. MRI done on 10/09/2014 reveals that the area where she had a phlegmon is without osteomyelitis. the bone marrow and other areas are compatible with reactive edema rather than osteomyelitis. 08/06/2015 --  x-ray of the left foot done on 07/30/2015 shows IMPRESSION:Soft tissue wound with a bandage is noted over the plantar aspect of the distal left foot. Diffuse osteopenia degenerative change. No acute bony abnormality 09/16/2015 -- last week she had gone to [facility] and met with Harrington Challenger who had spoken to me over the phone. Instead of a DH walking boot he has given her an offloading boot which will make her more steady and this boot is called DH healing sandal with a custom-made insole to offload this area. Readmission: 09/10/17 on evaluation today patient presents for reevaluation in regard to her left plantar foot ulcer has actually been since 2017 since we have last seen her. With that being said she has seem to do fairly well in the interim since that time. Patient is seen with both her daughter as well as her son present for today's evaluation. Her son tells me that they have ordered diabetic shoes for her which obviously are custom-made and that they were hoping these would be available today to bring in. With that being said she does also have some of the boots left that she had previously for offloading and they brought those with them today as well. With that being said up to this point patient has just been using her tennis shoes in regard to her footwear. She has had drainage from the ulcer and it really does not seem to be improving significantly at this time. She does have a history of diabetes mellitus type II which she is on medication and she is also on a blood  thinner. Fortunately she does not seem to have any evidence of discomfort due to the diabetic neuropathy. Patient does have a very high arch which is why she seems to get pressure to this area of the first metatarsal on the left foot leading to the ulcerations that develop. The current ulceration is in the exact location of her prior ulceration. She has not had a recent x-ray. 09/17/17 On evaluation  today patient appears to be doing well in regard to her left plantar foot ulcer. Since last week she has had excellent filling in of the wound and this is definitely much smaller than previously noted. With that being said she is having no pain she has neuropathy and therefore does not feel any pain anyway. There's no evidence of infection which is great news. She does have callous but again this is not nearly as significant as previously noted. Overall I'm definitely pleased with how things stand today. DAVION, MEARA ([id number]) 09/24/17 on evaluation today patient's left plantar foot ulcer appears to show signs of good improvement compared even to last week's evaluation. She is slowly healing and again from the first time I saw this this definitely appears to be doing much better. She's not have any discomfort which is great news unfortunately most of this is actually due to the fact that she has neuropathy. Other than that however she has been tolerating the dressing changes without complication she actually performs the dressing changes herself. 10/08/17 on evaluation today patient's foot ulcer on the right actually appears to be doing a little bit worse unfortunately. She has been tolerating the dressing changes fairly well she performs these herself. Although I'm unsure that she has really been packing the alginate into the wound I have discussed this with her before but it sounds as if she may not be. Nonetheless I did have a discussion concerning this again today with the daughter present as well as her son and other daughter whom I just met today. Patient did get her custom shoes which finally arrived which is definitely good news. These do appear to be excellent I did check them out in the offloading as well as our support is great. 10/15/17 on evaluation today patient appears to be doing little better compared to last week's evaluation. She does have a little bit of bruising on the lateral  border of the wound which seems to be indicative of some pressure occurring at that site. With that being said this is most likely as a result of the dressing slipping apparently with the new shoes this is not been staying in place as well. That's one thing we are going to have to continue to keep an eye on and see if there's any issues in that regard. Nonetheless we may need to go back to the offloading boots until we get this healed and then have her transfer back to her custom shoes depend on how things progress. Nonetheless at this time I do not see any definite or permanent damage and again she does seem to be doing a little bit better in my opinion. 10/22/17 on evaluation today patient actually appears to be doing rather well in regard to her left foot ulcer. The dressing has been staying on better which is good news. Subsequently the patient also has been utilizing her diabetic shoes with good result as well. Overall I think it is helpful for her now that the dressings are staying in place there appears to be no new injury  and the wound of the ball is showing signs of improving. 11/09/17 on evaluation today patient appears to be doing better in regard to her left plantar foot ulcer. She has much less callous buildup this week compared to last time that I evaluated her two weeks prior. Overall I feel like this is definitely showing signs of improvement which is good news. There does not appear to be any new injury to the foot which is also excellent news. Overall I feel like that her shoes for offloading are doing in great job. Overall I'm pleased with the progress. She does have an area on her left forearm which has previously been evaluated at the walk-in urgent care. She states that it seems to have done pretty well but her son would like for [place] to evaluate this further and ensure there is nothing that appears to be infected or otherwise causing her trouble. 11/23/17 on evaluation today  patient's wound actually appears to be doing fairly well at this point. She has been tolerating the dressing changes without complication. She does seem to be making some progress in regard to the overall quality and size of the wound in my opinion although she still develop some callous there is no evidence of deep tissue injury and most of the dark discoloration seems to be working its way out at this point. There's no evidence of infection. 12/07/17 on evaluation today patient appears to be doing great in regard to her left foot ulcer. Unfortunately the only thing I see that's an issue at this point is she's not really noting a lot of granulation and filling in at this time which I think we need to work on. This may require dressing change I don't think she needs the alginate anymore she's not having as much drainage as she had in the past. Therefore we're gonna see about doing something different as of today. Patient History Information obtained from Patient. Family History Cancer - Father, Diabetes - Siblings, Stroke - Mother, No family history of Heart Disease, Hereditary Spherocytosis, Hypertension, Kidney Disease, Lung Disease, Seizures, Thyroid Problems, Tuberculosis. Social History Never smoker, Marital Status - Widowed, Alcohol Use - Never, Drug Use - No History, Caffeine Use - Daily. Medical And Surgical History Notes Cardiovascular TIA KELLEE, SITTNER ([id number]) Neurologic TIA Review of Systems (ROS) Constitutional Symptoms (General Health) Denies complaints or symptoms of Fever, Chills. Respiratory The patient has no complaints or symptoms. Cardiovascular The patient has no complaints or symptoms. Psychiatric The patient has no complaints or symptoms. Objective Constitutional Well-nourished and well-hydrated in no acute distress. Vitals Time Taken: 10:01 AM, Height: 65 in, Weight: 107 lbs, BMI: 17.8, Temperature: 98.2 F, Pulse: 75 bpm, Respiratory Rate: 18  breaths/min, Blood Pressure: 167/69 mmHg. Respiratory normal breathing without difficulty. Psychiatric this patient is able to make decisions and demonstrates good insight into disease process. Alert and Oriented x 3. pleasant and cooperative. General Notes: At this time patient's wound shows evidence of a little bit of slough and biofilm on the surface of the wound fortunately there does not appear to be any evidence of infection which is good news. She does have some callous that was worked on around [employer] of the wound today although this is minimal compared to how this was previous. I do believe her diabetic shoes are helpful for her. Integumentary (Hair, Skin) Wound #4 status is Open. Original cause of wound was Pressure Injury. The wound is located on the Left,Plantar Metatarsal head first. The wound measures  0.4cm length x 0.4cm width x 0.3cm depth; 0.126cm^2 area and 0.038cm^3 volume. There is Fat Layer (Subcutaneous Tissue) Exposed exposed. There is no tunneling or undermining noted. There is a medium amount of serous drainage noted. The wound margin is indistinct and nonvisible. There is large (67-100%) red granulation within the wound bed. There is a small (1-33%) amount of necrotic tissue within the wound bed including Adherent Slough. The periwound skin appearance exhibited: Callus. The periwound skin appearance did not exhibit: Crepitus, Excoriation, Induration, Rash, Scarring, Dry/Scaly, Maceration, Atrophie Blanche, Cyanosis, Ecchymosis, Hemosiderin Staining, Mottled, Pallor, Rubor, Erythema. Periwound temperature was noted as No Abnormality. The periwound has tenderness on palpation. Assessment JOCELYNE, REINERTSEN ([id number]) Active Problems ICD-10 E11.621 - Type 2 diabetes mellitus with foot ulcer E11.40 - Type 2 diabetes mellitus with diabetic neuropathy, unspecified L97.522 - Non-pressure chronic ulcer of other part of left foot with fat layer exposed S51.802A -  Unspecified open wound of left forearm, initial encounter L84 - Corns and callosities I10 - Essential (primary) hypertension Procedures Wound #4 Pre-procedure diagnosis of Wound #4 is a Pressure Ulcer located on the Left,Plantar Metatarsal head first . There was a Excisional Skin/Subcutaneous Tissue Debridement with a total area of 0.16 sq cm performed by STONE III, Lynwood Kubisiak E., PA-C. With the following instrument(s): Curette to remove Viable and Non-Viable tissue/material. Material removed includes Callus, Subcutaneous Tissue, and Biofilm after achieving pain control using Other (lidocaine 4%). No specimens were taken. A time out was conducted at 10:15, prior to the start of the procedure. A Minimum amount of bleeding was controlled with Pressure. The procedure was tolerated well with a pain level of 1 throughout and a pain level of 0 following the procedure. Patient s Level of Consciousness post procedure was recorded as Awake and Alert and post-procedure vitals were taken including Temperature: 98.2 F, Pulse: 75 bpm, Respiratory Rate: 18 breaths/min, Blood Pressure: (167)/(69) mmHg. Post Debridement Measurements: 0.4cm length x 0.4cm width x 0.3cm depth; 0.038cm^3 volume. Post debridement Stage noted as Category/Stage IV. Character of Wound/Ulcer Post Debridement is stable. Post procedure Diagnosis Wound #4: Same as Pre-Procedure Plan Wound Cleansing: Wound #4 Left,Plantar Metatarsal head first: Clean wound with Normal Saline. Cleanse wound with mild soap and water May Shower, gently pat wound dry prior to applying new dressing. Anesthetic (add to Medication List): Wound #4 Left,Plantar Metatarsal head first: Topical Lidocaine 4% cream applied to wound bed prior to debridement (In Clinic Only). Skin Barriers/Peri-Wound Care: Wound #4 Left,Plantar Metatarsal head first: Skin Prep Primary Wound Dressing: Wound #4 Left,Plantar Metatarsal head first: Other: - Endoform Secondary  Dressing: Wound #4 Left,Plantar Metatarsal head first: Boardered Foam Dressing - or [place] with foam for padding Foam Dressing Change Frequency: Wound #4 Left,Plantar Metatarsal head first: SHAUGHNESSY, GETHERS ([id number]) Change dressing every other day. Follow-up Appointments: Wound #4 Left,Plantar Metatarsal head first: Return Appointment in 2 weeks. Additional Orders / Instructions: Wound #4 Left,Plantar Metatarsal head first: Increase protein intake. Activity as tolerated I am going to suggest currently that we switch to Endoform to see how this will do over the next week. The patient is in agreement with the plan in effect will be seeing her for a fault visit in two weeks time. She does perform the dressing changes for self and therefore we will show her how to do this today. We will see her for reevaluation in two weeks to see were things stand if anything changes in the interim she will contact the office and let us know  otherwise please see above for specific wound care orders. Electronic Signature(s) Signed: 12/07/2017 6:53:41 PM By: Worthy Keeler PA-C Entered By: Worthy Keeler on 12/07/2017 10:29:11 Angela Patterson ([id number]) -------------------------------------------------------------------------------- ROS/PFSH Details Patient Name: Angela Patterson. Date of Service: 12/07/2017 10:00 AM Medical Record Number: [id number] Patient Account Number: [id number] Date of Birth/Sex: 10/03/1920 (84 y.o. F) Treating RN: Cornell Barman Primary Care Provider: Einar Pheasant Other Clinician: Referring Provider: Einar Pheasant Treating Provider/Extender: Melburn Hake, Tiffany Talarico Weeks in Treatment: 12 Information Obtained From Patient Wound History Do you currently have one or more open woundso Yes How many open wounds do you currently haveo 1 Approximately how long have you had your woundso one month open How have you been treating your wound(s) until nowo wound cleanser Has your  wound(s) ever healed and then re-openedo Yes Have you had any lab work done in the past montho No Have you tested positive for an antibiotic resistant organism (MRSA, VRE)o No Have you tested positive for osteomyelitis (bone infection)o No Have you had any tests for circulation on your legso No Constitutional Symptoms (General Health) Complaints and Symptoms: Negative for: Fever; Chills Eyes Medical History: Positive for: Cataracts - removed "a few years ago" (B ); Glaucoma Ear/Nose/Mouth/Throat Medical History: Negative for: Chronic sinus problems/congestion; Middle ear problems Hematologic/Lymphatic Medical History: Negative for: Anemia; Hemophilia; Human Immunodeficiency Virus; Lymphedema; Sickle Cell Disease Respiratory Complaints and Symptoms: No Complaints or Symptoms Medical History: Negative for: Aspiration; Asthma; Chronic Obstructive Pulmonary Disease (COPD); Pneumothorax; Sleep Apnea; Tuberculosis Cardiovascular Complaints and Symptoms: No Complaints or Symptoms Medical History: Positive for: Hypertension Past Medical History NotesMarland Kitchen DONYALE, FALCON ([id number]) TIA Gastrointestinal Medical History: Negative for: Cirrhosis ; Crohnos; Hepatitis A; Hepatitis B; Hepatitis C Endocrine Medical History: Positive for: Type II Diabetes Time with diabetes: 2 years Treated with: Oral agents, Diet Blood sugar tested every day: Yes Tested : twice Blood sugar testing results: Bedtime: 115 Genitourinary Medical History: Negative for: End Stage Renal Disease Immunological Medical History: Negative for: Lupus Erythematosus; Raynaudos; Scleroderma Integumentary (Skin) Medical History: Positive for: History of pressure wounds Negative for: History of Burn Musculoskeletal Medical History: Negative for: Gout; Rheumatoid Arthritis; Osteoarthritis; Osteomyelitis Neurologic Medical History: Positive for: Neuropathy Past Medical History Notes: TIA Oncologic Medical  History: Negative for: Received Chemotherapy; Received Radiation Psychiatric Complaints and Symptoms: No Complaints or Symptoms HBO Extended History Items Eyes: Eyes: Cataracts Glaucoma TAKEYSHA, BONK ([id number]) Immunizations Pneumococcal Vaccine: Received Pneumococcal Vaccination: Yes Implantable Devices Family and Social History Cancer: Yes - Father; Diabetes: Yes - Siblings; Heart Disease: No; Hereditary Spherocytosis: No; Hypertension: No; Kidney Disease: No; Lung Disease: No; Seizures: No; Stroke: Yes - Mother; Thyroid Problems: No; Tuberculosis: No; Never smoker; Marital Status - Widowed; Alcohol Use: Never; Drug Use: No History; Caffeine Use: Daily; Financial Concerns: No; Food, Clothing or Shelter Needs: No; Support System Lacking: No; Transportation Concerns: No; Advanced Directives: Yes (Not Provided); Patient does not want information on Advanced Directives; Do not resuscitate: No; Living Will: Yes (Copy provided); Medical Power of Attorney: Yes - Olen Cordial- daughter (Copy provided) Physician Affirmation I have reviewed and agree with the above information. Electronic Signature(s) Signed: 12/07/2017 5:25:47 PM By: Gretta Cool, BSN, RN, CWS, Kim RN, BSN Signed: 12/07/2017 6:53:41 PM By: Worthy Keeler PA-C Entered By: Worthy Keeler on 12/07/2017 10:28:20 Angela Patterson ([id number]) -------------------------------------------------------------------------------- SuperBill Details Patient Name: Angela Patterson. Date of Service: 12/07/2017 Medical Record Number: [id number] Patient Account Number: [id number] Date of Birth/Sex: Aug 04, 1920 (84 y.o. F) Treating RN: Gretta Cool,  Kim Primary Care Provider: Einar Pheasant Other Clinician: Referring Provider: Einar Pheasant Treating Provider/Extender: Melburn Hake, Kirstina Leinweber Weeks in Treatment: 12 Diagnosis Coding ICD-10 Codes Code Description E11.621 Type 2 diabetes mellitus with foot ulcer E11.40 Type 2 diabetes mellitus with diabetic  neuropathy, unspecified L97.522 Non-pressure chronic ulcer of other part of left foot with fat layer exposed S51.802A Unspecified open wound of left forearm, initial encounter L84 Corns and callosities I10 Essential (primary) hypertension Facility Procedures CPT4 Code: 68548830 Description: 14159 - DEB SUBQ TISSUE 20 SQ CM/< ICD-10 Diagnosis Description L97.522 Non-pressure chronic ulcer of other part of left foot with fat Modifier: layer exposed Quantity: 1 Physician Procedures CPT4 Code: 7331250 Description: 87199 - WC PHYS SUBQ TISS 20 SQ CM ICD-10 Diagnosis Description L97.522 Non-pressure chronic ulcer of other part of left foot with fat Modifier: layer exposed Quantity: 1 Electronic Signature(s) Signed: 12/07/2017 6:53:41 PM By: Worthy Keeler PA-C Entered By: Worthy Keeler on 12/07/2017 10:29:25

## 2017-12-10 ENCOUNTER — Encounter: Payer: Self-pay | Admitting: Internal Medicine

## 2017-12-10 ENCOUNTER — Ambulatory Visit (INDEPENDENT_AMBULATORY_CARE_PROVIDER_SITE_OTHER): Payer: Medicare Other | Admitting: Internal Medicine

## 2017-12-10 DIAGNOSIS — R197 Diarrhea, unspecified: Secondary | ICD-10-CM

## 2017-12-10 DIAGNOSIS — Z8673 Personal history of transient ischemic attack (TIA), and cerebral infarction without residual deficits: Secondary | ICD-10-CM | POA: Diagnosis not present

## 2017-12-10 DIAGNOSIS — E1140 Type 2 diabetes mellitus with diabetic neuropathy, unspecified: Secondary | ICD-10-CM

## 2017-12-10 DIAGNOSIS — I10 Essential (primary) hypertension: Secondary | ICD-10-CM | POA: Diagnosis not present

## 2017-12-10 DIAGNOSIS — D696 Thrombocytopenia, unspecified: Secondary | ICD-10-CM

## 2017-12-10 DIAGNOSIS — E7800 Pure hypercholesterolemia, unspecified: Secondary | ICD-10-CM | POA: Diagnosis not present

## 2017-12-10 DIAGNOSIS — L97529 Non-pressure chronic ulcer of other part of left foot with unspecified severity: Secondary | ICD-10-CM | POA: Diagnosis not present

## 2017-12-10 NOTE — Progress Notes (Signed)
Patient ID: Angela Patterson, female   DOB: 1921-01-08, 84 y.o.   MRN: [id number]   Subjective:    Patient ID: Angela Patterson, female    DOB: 08/08/1920, 84 y.o.   MRN: [id number]  HPI  Patient here for a scheduled follow up. She is accompanied by her daughter.  History obtained from both of them.  Still having some increased diarrhea.  Is better since stopping probiotic.  Discussed with them today.  They still feel a lot of her bowel issues are related to increased stress. Discussed stress.  Discussed further intervention.  She does not feel needs any further intervention.  Eating.  Good appetite.  No acid reflux.  No nausea or vomiting.  No abdominal pain.  No blood in the stool.  States sugars are averaging 130-150 in the am and pm sugars averaging 180-220.  a1c just checked - 6.8.  Overall she feels she is doing well.  Foot is better.  Being followed at wound clinic.     Past Medical History:  Diagnosis Date  . Allergy   . Chicken pox   . CVA (cerebral vascular accident) ([place])   . Diabetes mellitus ([place])   . Hypercholesterolemia   . Hypertension    Past Surgical History:  Procedure Laterality Date  . ABDOMINAL HYSTERECTOMY  1971   excessive bleeding  . APPENDECTOMY  age 63  . BREAST LUMPECTOMY  1958   benign  . DILATION AND CURETTAGE OF UTERUS  1970   Family History  Problem Relation Age of Onset  . Liver cancer Father   . Stroke Mother   . Diabetes Brother   . Hypertension Daughter   . Hypertension Son   . Hypertension Daughter   . Cancer Grandchild        breast  . Diabetes Grandchild   . Breast cancer Neg Hx   . Colon cancer Neg Hx    Social History   Socioeconomic History  . Marital status: Widowed    Spouse name: Not on file  . Number of children: 3  . Years of education: Not on file  . Highest education level: Not on file  Occupational History  . Not on file  Social Needs  . Financial resource strain: Not on file  . Food insecurity:    Worry: Not on file   Inability: Not on file  . Transportation needs:    Medical: Not on file    Non-medical: Not on file  Tobacco Use  . Smoking status: Never Smoker  . Smokeless tobacco: Never Used  Substance and Sexual Activity  . Alcohol use: No    Alcohol/week: 0.0 oz  . Drug use: No  . Sexual activity: Not on file  Lifestyle  . Physical activity:    Days per week: Not on file    Minutes per session: Not on file  . Stress: Not on file  Relationships  . Social connections:    Talks on phone: Not on file    Gets together: Not on file    Attends religious service: Not on file    Active member of club or organization: Not on file    Attends meetings of clubs or organizations: Not on file    Relationship status: Not on file  Other Topics Concern  . Not on file  Social History Narrative  . Not on file    Outpatient Encounter Medications as of 12/10/2017  Medication Sig  . amLODipine (NORVASC) 5 MG tablet TAKE 1  TABLET BY MOUTH TWO  TIMES DAILY  . clopidogrel (PLAVIX) 75 MG tablet TAKE 1 TABLET BY MOUTH  EVERY DAY  . glucose blood (ONE TOUCH ULTRA TEST) test strip TEST TWICE A DAY  . Lancet Devices (ONE TOUCH DELICA LANCING DEV) MISC Use twice daily Dx: 250.00  . metFORMIN (GLUCOPHAGE-XR) 500 MG 24 hr tablet TAKE 1 TABLET BY MOUTH TWO  TIMES DAILY BEFORE A MEAL.  . Multiple Vitamin (MULTIVITAMIN) tablet Take 1 tablet by mouth daily.  . Probiotic Product (PROBIOTIC DAILY PO) Take 1 tablet by mouth daily.  . timolol (BETIMOL) 0.5 % ophthalmic solution 1 drop 2 (two) times daily.  . timolol (TIMOPTIC) 0.5 % ophthalmic solution    No facility-administered encounter medications on file as of 12/10/2017.     Review of Systems  Constitutional: Negative for appetite change and unexpected weight change.  HENT: Negative for congestion and sinus pressure.   Respiratory: Negative for cough, chest tightness and shortness of breath.   Cardiovascular: Negative for chest pain, palpitations and leg swelling.    Gastrointestinal: Positive for diarrhea. Negative for abdominal pain, nausea and vomiting.  Genitourinary: Negative for difficulty urinating and dysuria.  Musculoskeletal: Negative for joint swelling and myalgias.  Skin: Negative for color change and rash.       Foot is better - wound better.    Neurological: Negative for dizziness, light-headedness and headaches.  Psychiatric/Behavioral: Negative for agitation and dysphoric mood.       Objective:    Physical Exam  Constitutional: She appears well-developed and well-nourished. No distress.  HENT:  Nose: Nose normal.  Mouth/Throat: Oropharynx is clear and moist.  Neck: Neck supple. No thyromegaly present.  Cardiovascular: Normal rate and regular rhythm.  Pulmonary/Chest: Breath sounds normal. No respiratory distress. She has no wheezes.  Abdominal: Soft. Bowel sounds are normal. There is no tenderness.  Musculoskeletal: She exhibits no edema or tenderness.  Lymphadenopathy:    She has no cervical adenopathy.  Skin: No rash noted. No erythema.  Psychiatric: She has a normal mood and affect. Her behavior is normal.    BP 136/74 (BP Location: Left Arm, Patient Position: Sitting, Cuff Size: Normal)   Pulse 76   Temp 98.1 F (36.7 C) (Oral)   Resp 18   Wt 105 lb 3.2 oz (47.7 kg)   LMP 07/21/1966   SpO2 96%   BMI 18.06 kg/m  Wt Readings from Last 3 Encounters:  12/10/17 105 lb 3.2 oz (47.7 kg)  09/03/17 106 lb 6.4 oz (48.3 kg)  07/07/17 106 lb (48.1 kg)     Lab Results  Component Value Date   WBC 8.3 12/08/2017   HGB 15.1 (H) 12/08/2017   HCT 44.6 12/08/2017   PLT 150.0 12/08/2017   GLUCOSE 155 (H) 12/08/2017   CHOL 173 12/08/2017   TRIG 194.0 (H) 12/08/2017   HDL 44.90 12/08/2017   LDLDIRECT 66.4 05/24/2013   LDLCALC 90 12/08/2017   ALT 12 12/08/2017   AST 17 12/08/2017   NA 140 12/08/2017   K 4.5 12/08/2017   CL 103 12/08/2017   CREATININE 0.65 12/08/2017   BUN 15 12/08/2017   CO2 31 12/08/2017   TSH 3.06  02/22/2017   HGBA1C 6.8 (H) 12/08/2017   MICROALBUR 3.0 (H) 02/24/2017    Dg Foot Complete Left  Result Date: 07/30/2015 CLINICAL DATA:  Nonhealing wound. EXAM: LEFT FOOT - COMPLETE 3+ VIEW COMPARISON:  MRI 10/09/2014. FINDINGS: Diffuse osteopenia and degenerative change. No acute bony abnormality identified. A a wound  with a bandage is noted over the plantar aspect of the distal foot. No adjacent acute bony abnormality. If osteomyelitis is of concern MRI can be obtained. IMPRESSION: Soft tissue wound with a bandage is noted over the plantar aspect of the distal left foot. Diffuse osteopenia degenerative change. No acute bony abnormality. Electronically Signed   By: Marcello Moores  Register   On: 07/30/2015 15:46       Assessment & Plan:   Problem List Items Addressed This Visit    Diabetes mellitus ([facility])    Low carb diet and exercise.  Follow met b anda 1c.  Sugars as outlined.        Relevant Orders   Hemoglobin E1R   Basic metabolic panel   Microalbumin / creatinine urine ratio   Diarrhea    Persistent loose stool.  Better since stopping probiotics.  Feels related to stress.  Not daily.  Discussed further evaluation and w/up.  She declines.  Follow.       Essential (primary) hypertension    Blood pressure under good control.  Continue same medication regimen.  Follow pressures.  Follow metabolic panel.        Relevant Orders   TSH   Foot ulcer ([place])    Being followed by wound clinic.  Improved.  Follow.        History of CVA (cerebrovascular accident)    On plavix and doing well.  Follow.        Hypercholesterolemia    Follow lipid panel.        Relevant Orders   Hepatic function panel   Lipid panel   Thrombocytopenia ([facility])    Platelet count has been stable.  Recent check wnl.  Follow.        Relevant Orders   CBC with Differential/Platelet       Einar Pheasant, MD

## 2017-12-12 IMAGING — CR DG FOOT COMPLETE 3+V*L*
1 series · 3 of 3 positions shown · non-contrast
Comparison: MRI 10/09/2014.

CLINICAL DATA: Nonhealing wound.

EXAM:
LEFT FOOT - COMPLETE 3+ VIEW

[Series 1: dg foot complete left · 0.14mm/px · 3 of 3 slices shown]
[im 1/3]
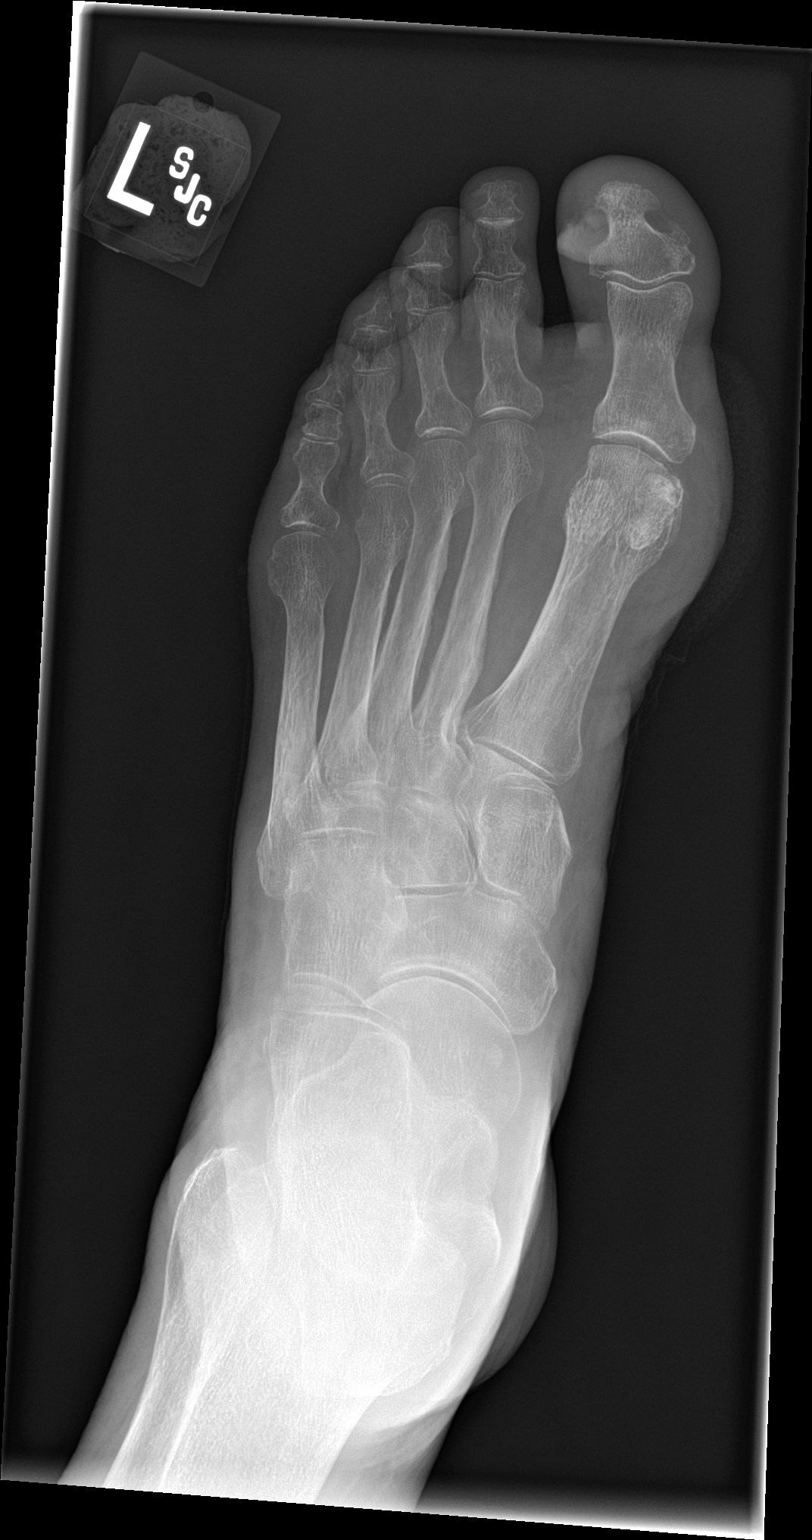
[im 2/3]
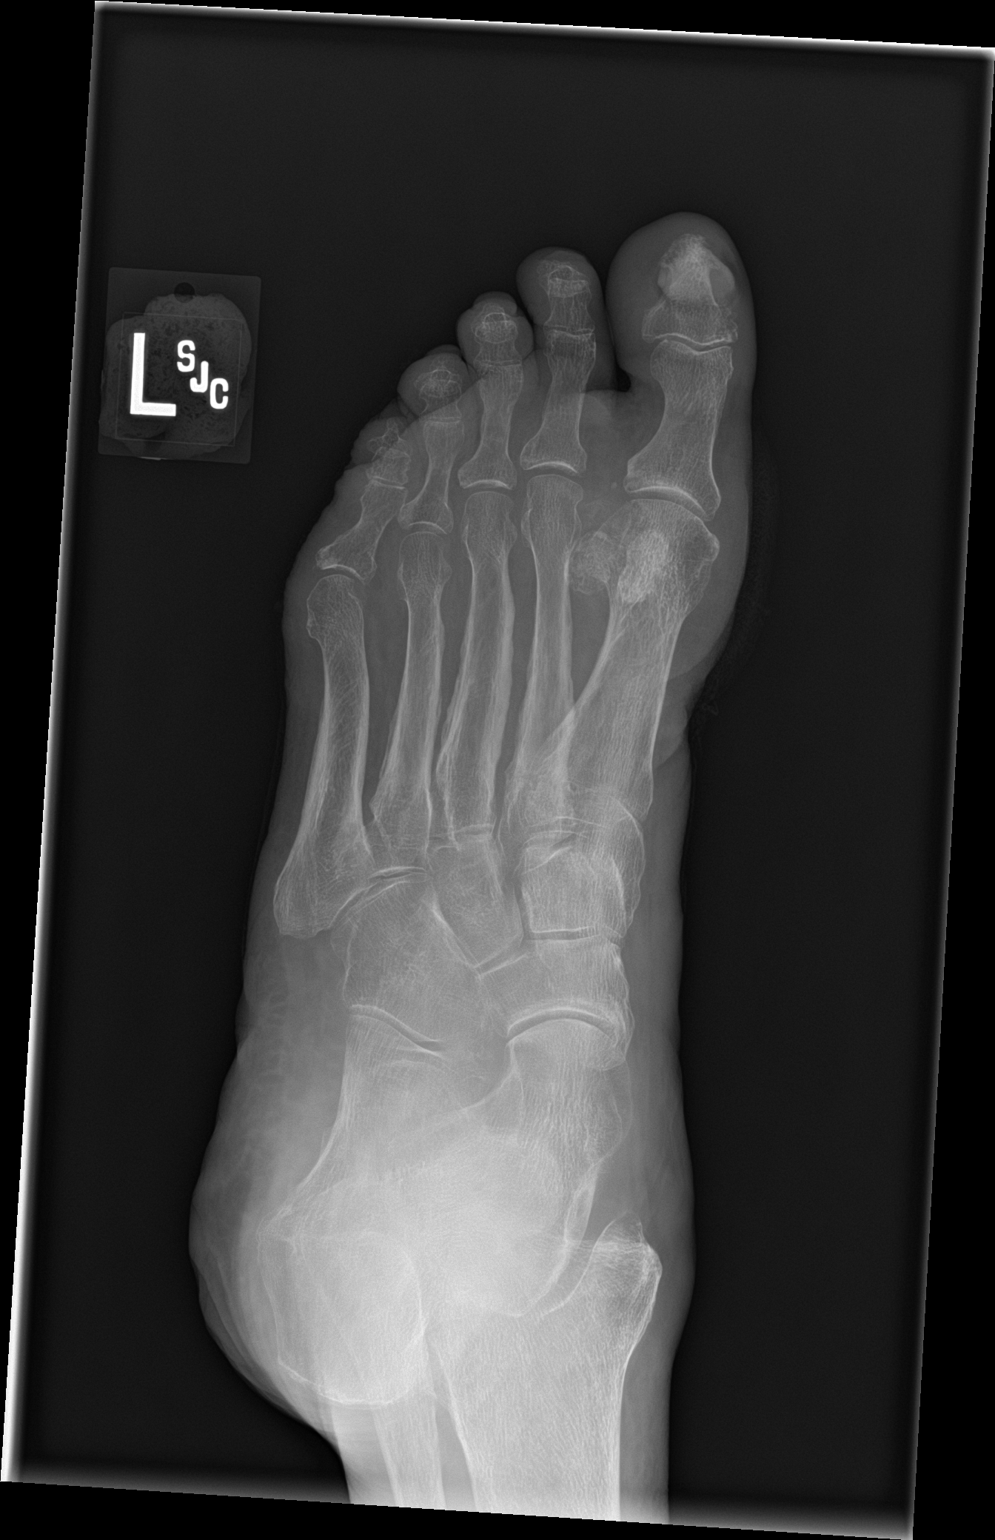
[im 3/3]
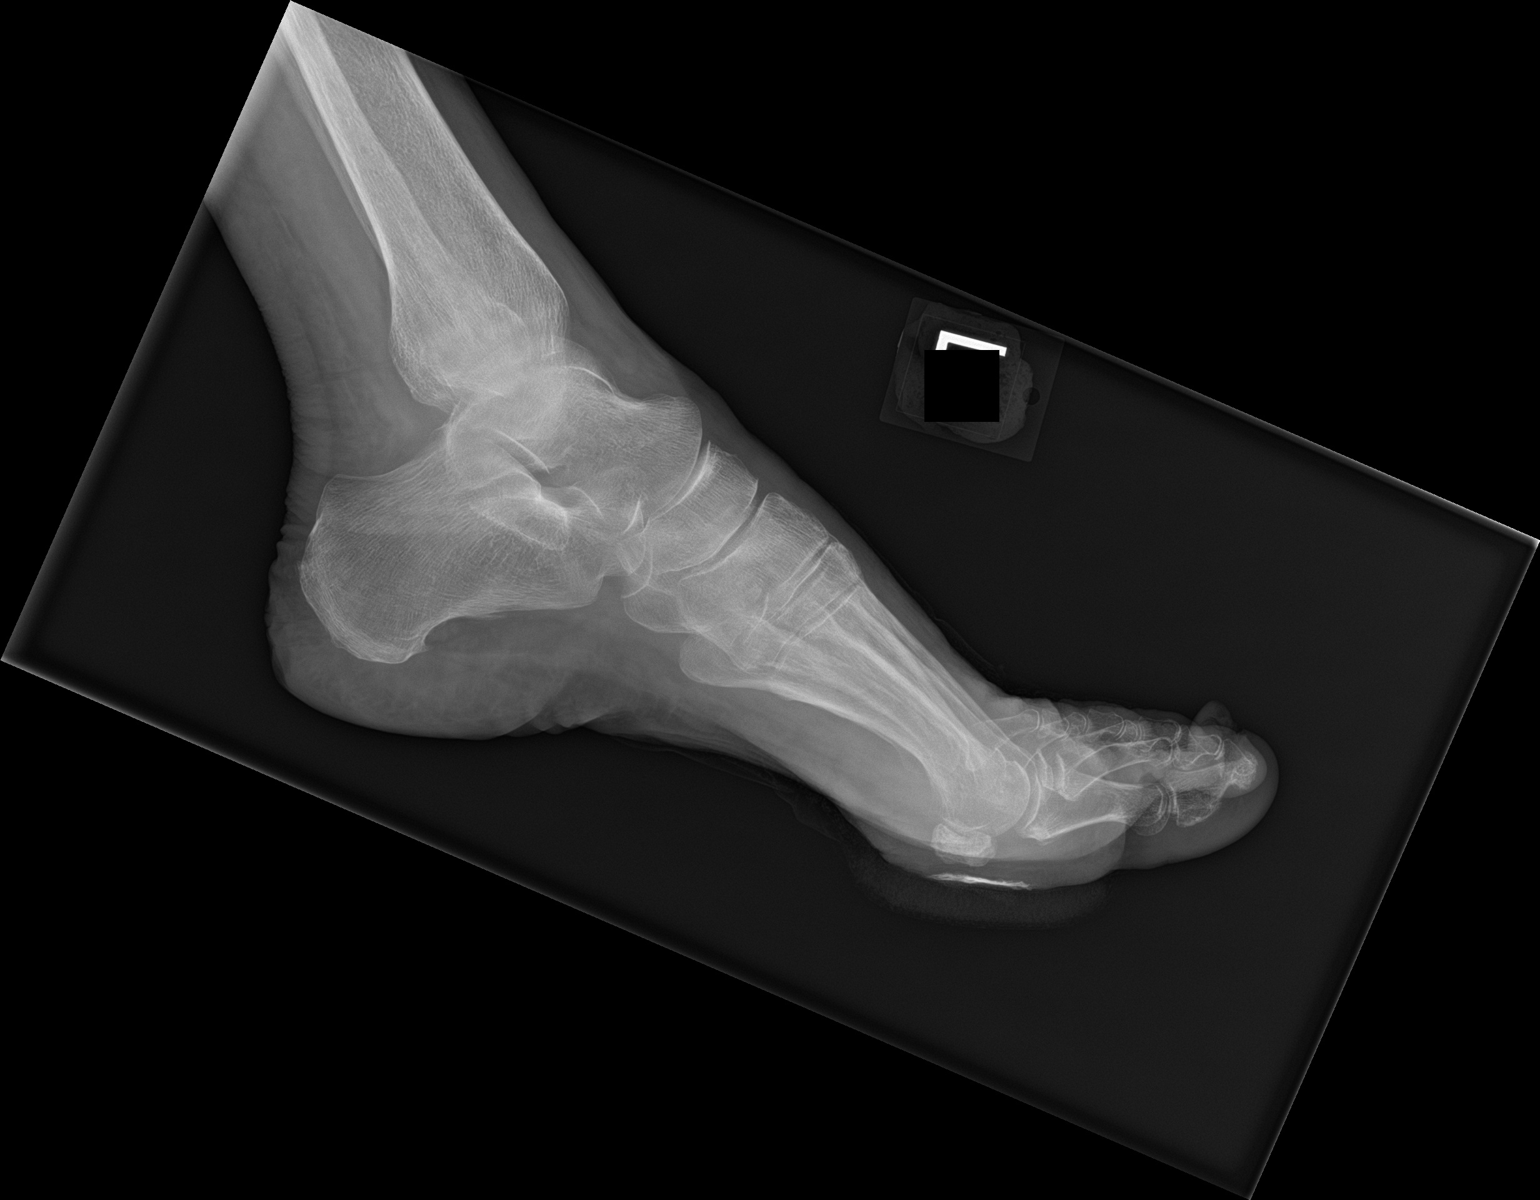

[3 of 3 positions shown; findings below may reference images not displayed]

FINDINGS: Diffuse osteopenia and degenerative change. No acute bony
abnormality identified. A a wound with a bandage is noted over the
plantar aspect of the distal foot. No adjacent acute bony
abnormality. If osteomyelitis is of concern MRI can be obtained.
IMPRESSION: Soft tissue wound with a bandage is noted over the plantar aspect of
the distal left foot. Diffuse osteopenia degenerative change. No
acute bony abnormality.

## 2017-12-13 ENCOUNTER — Encounter: Payer: Self-pay | Admitting: Internal Medicine

## 2017-12-13 DIAGNOSIS — R197 Diarrhea, unspecified: Secondary | ICD-10-CM | POA: Insufficient documentation

## 2017-12-13 NOTE — Assessment & Plan Note (Signed)
On plavix and doing well.  Follow.   

## 2017-12-13 NOTE — Assessment & Plan Note (Signed)
Persistent loose stool.  Better since stopping probiotics.  Feels related to stress.  Not daily.  Discussed further evaluation and w/up.  She declines.  Follow.

## 2017-12-13 NOTE — Assessment & Plan Note (Signed)
Low carb diet and exercise.  Follow met b and a1c.  Sugars as outlined.   

## 2017-12-13 NOTE — Assessment & Plan Note (Signed)
Follow lipid panel.   

## 2017-12-13 NOTE — Assessment & Plan Note (Signed)
Platelet count has been stable.  Recent check wnl.  Follow.

## 2017-12-13 NOTE — Assessment & Plan Note (Signed)
Being followed by wound clinic.  Improved.  Follow.

## 2017-12-13 NOTE — Assessment & Plan Note (Signed)
Blood pressure under good control.  Continue same medication regimen.  Follow pressures.  Follow metabolic panel.   

## 2017-12-14 ENCOUNTER — Other Ambulatory Visit: Payer: Self-pay | Admitting: Internal Medicine

## 2017-12-14 DIAGNOSIS — H35371 Puckering of macula, right eye: Secondary | ICD-10-CM | POA: Diagnosis not present

## 2017-12-14 DIAGNOSIS — H401132 Primary open-angle glaucoma, bilateral, moderate stage: Secondary | ICD-10-CM | POA: Diagnosis not present

## 2017-12-15 NOTE — Progress Notes (Signed)
ROBYNE, MATAR ([id number]) Visit Report for 11/23/2017 Arrival Information Details Patient Name: Angela Patterson, Angela Patterson. Date of Service: 11/23/2017 9:30 AM Medical Record Number: [id number] Patient Account Number: [id number] Date of Birth/Sex: [birth year] (84 y.o. F) Treating RN: Cornell Barman Primary Care Janiaya Ryser: Einar Pheasant Other Clinician: Referring Desirie Minteer: Einar Pheasant Treating Ulanda Tackett/Extender: Melburn Hake, HOYT Weeks in Treatment: 10 Visit Information History Since Last Visit Added or deleted any medications: No Patient Arrived: Ambulatory Any new allergies or adverse reactions: No Arrival Time: 09:51 Had a fall or experienced change in No Accompanied By: friend activities of daily living that may affect Transfer Assistance: None risk of falls: Patient Identification Verified: Yes Signs or symptoms of abuse/neglect since last visito No Secondary Verification Process Completed: Yes Hospitalized since last visit: No Patient Requires Transmission-Based No Implantable device outside of the clinic excluding No Precautions: cellular tissue based products placed in the center Patient Has Alerts: Yes since last visit: Patient Alerts: DMII Has Dressing in Place as Prescribed: Yes Pain Present Now: No Electronic Signature(s) Signed: 11/24/2017 5:47:31 PM By: Gretta Cool, BSN, RN, CWS, Kim RN, BSN Entered By: Gretta Cool, BSN, RN, CWS, Kim on 11/23/2017 09:52:27 Angela Patterson ([id number]) -------------------------------------------------------------------------------- Encounter Discharge Information Details Patient Name: Angela Patterson. Date of Service: 11/23/2017 9:30 AM Medical Record Number: [id number] Patient Account Number: [id number] Date of Birth/Sex: [birth year] (84 y.o. F) Treating RN: Ahmed Prima Primary Care Miciah Covelli: Einar Pheasant Other Clinician: Referring Elizabeth Haff: Einar Pheasant Treating Olie Dibert/Extender: Melburn Hake, HOYT Weeks in Treatment: 10 Encounter Discharge  Information Items Discharge Condition: Stable Ambulatory Status: Ambulatory Discharge Destination: Home Transportation: Private Auto Accompanied By: daughter Schedule Follow-up Appointment: Yes Clinical Summary of Care: Electronic Signature(s) Signed: 12/15/2017 7:45:54 AM By: Harold Barban Entered By: Harold Barban on 11/23/2017 10:42:48 Angela Patterson ([id number]) -------------------------------------------------------------------------------- Lower Extremity Assessment Details Patient Name: Angela Patterson. Date of Service: 11/23/2017 9:30 AM Medical Record Number: [id number] Patient Account Number: [id number] Date of Birth/Sex: 05-01-1921 (84 y.o. F) Treating RN: Cornell Barman Primary Care Riyad Keena: Einar Pheasant Other Clinician: Referring Crystel Demarco: Einar Pheasant Treating Kalynn Declercq/Extender: Melburn Hake, HOYT Weeks in Treatment: 10 Edema Assessment Assessed: [Left: No] [Right: No] Edema: [Left: N] [Right: o] Vascular Assessment Pulses: Dorsalis Pedis Palpable: [Left:Yes] Posterior Tibial Extremity colors, hair growth, and conditions: Extremity Color: [Left:Normal] Hair Growth on Extremity: [Left:No] Temperature of Extremity: [Left:Warm] Capillary Refill: [Left:< 3 seconds] Toe Nail Assessment Left: Right: Discolored: No Deformed: Yes Improper Length and Hygiene: No Electronic Signature(s) Signed: 11/24/2017 5:47:31 PM By: Gretta Cool, BSN, RN, CWS, Kim RN, BSN Entered By: Gretta Cool, BSN, RN, CWS, Kim on 11/23/2017 09:59:22 Angela Patterson ([id number]) -------------------------------------------------------------------------------- Multi Wound Chart Details Patient Name: Angela Patterson. Date of Service: 11/23/2017 9:30 AM Medical Record Number: [id number] Patient Account Number: [id number] Date of Birth/Sex: 02/26/1921 (84 y.o. F) Treating RN: Ahmed Prima Primary Care Keelynn Furgerson: Einar Pheasant Other Clinician: Referring Daphnee Preiss: Einar Pheasant Treating Samiah Ricklefs/Extender:  Melburn Hake, HOYT Weeks in Treatment: 10 Vital Signs Height(in): 65 Pulse(bpm): 74 Weight(lbs): 107 Blood Pressure(mmHg): 158/86 Body Mass Index(BMI): 18 Temperature(F): 98.2 Respiratory Rate 16 (breaths/min): Photos: [4:No Photos] [5:No Photos] [N/A:N/A] Wound Location: [4:Left Metatarsal head first - Plantar] [5:Left Forearm] [N/A:N/A] Wounding Event: [4:Pressure Injury] [5:Trauma] [N/A:N/A] Primary Etiology: [4:Pressure Ulcer] [5:Trauma, Other] [N/A:N/A] Comorbid History: [4:Cataracts, Glaucoma, Hypertension, Type II Diabetes, History of pressure wounds, Neuropathy] [5:N/A] [N/A:N/A] Date Acquired: [4:08/20/2017] [5:10/09/2017] [N/A:N/A] Weeks of Treatment: [4:10] [5:2] [N/A:N/A] Wound Status: [4:Open] [5:Healed - Epithelialized] [N/A:N/A] Measurements L x W x D [4:0.4x0.5x0.3] [5:0x0x0] [N/A:N/A] (cm) Area (cm) : [  4:0.157] [5:0] [N/A:N/A] Volume (cm) : [4:0.047] [5:0] [N/A:N/A] % Reduction in Area: [4:91.10%] [5:100.00%] [N/A:N/A] % Reduction in Volume: [4:94.70%] [5:100.00%] [N/A:N/A] Starting Position 1 [4:8] (o'clock): Ending Position 1 [4:3] (o'clock): Maximum Distance 1 (cm): [4:0.4] Undermining: [4:Yes] [5:N/A] [N/A:N/A] Classification: [4:Category/Stage IV] [5:Partial Thickness] [N/A:N/A] Exudate Amount: [4:Large] [5:N/A] [N/A:N/A] Exudate Type: [4:Serosanguineous] [5:N/A] [N/A:N/A] Exudate Color: [4:red, brown] [5:N/A] [N/A:N/A] Wound Margin: [4:Indistinct, nonvisible] [5:N/A] [N/A:N/A] Granulation Amount: [4:Medium (34-66%)] [5:N/A] [N/A:N/A] Granulation Quality: [4:Red] [5:N/A] [N/A:N/A] Necrotic Amount: [4:Medium (34-66%)] [5:N/A] [N/A:N/A] Necrotic Tissue: [4:Eschar, Adherent Slough] [5:N/A] [N/A:N/A] Exposed Structures: [4:Fat Layer (Subcutaneous Tissue) Exposed: Yes Fascia: No Tendon: No] [5:N/A] [N/A:N/A] Muscle: No Joint: No Bone: No Epithelialization: None N/A N/A Periwound Skin Texture: Callus: Yes No Abnormalities Noted N/A Excoriation:  No Induration: No Crepitus: No Rash: No Scarring: No Periwound Skin Moisture: Maceration: No No Abnormalities Noted N/A Dry/Scaly: No Periwound Skin Color: Atrophie Blanche: No No Abnormalities Noted N/A Cyanosis: No Ecchymosis: No Erythema: No Hemosiderin Staining: No Mottled: No Pallor: No Rubor: No Temperature: No Abnormality N/A N/A Tenderness on Palpation: Yes No N/A Wound Preparation: Ulcer Cleansing: N/A N/A Rinsed/Irrigated with Saline Topical Anesthetic Applied: Other: lidocaine 4% Treatment Notes Electronic Signature(s) Signed: 11/23/2017 5:11:14 PM By: Alric Quan Entered By: Alric Quan on 11/23/2017 10:21:27 Angela Patterson ([id number]) -------------------------------------------------------------------------------- [place] Details Patient Name: Angela Patterson. Date of Service: 11/23/2017 9:30 AM Medical Record Number: [id number] Patient Account Number: [id number] Date of Birth/Sex: August 21, 1920 (84 y.o. F) Treating RN: Ahmed Prima Primary Care Santonio Speakman: Einar Pheasant Other Clinician: Referring Donisha Hoch: Einar Pheasant Treating Rifky Lapre/Extender: Melburn Hake, HOYT Weeks in Treatment: 10 Active Inactive ` Abuse / Safety / Falls / Self Care Management Nursing Diagnoses: Potential for falls Goals: Patient will not experience any injury related to falls Date Initiated: 09/10/2017 Target Resolution Date: 11/27/2017 Goal Status: Active Interventions: Assess fall risk on admission and as needed Notes: ` Orientation to the Wound Care Program Nursing Diagnoses: Knowledge deficit related to the wound healing center program Goals: Patient/caregiver will verbalize understanding of the [facility] Program Date Initiated: 09/10/2017 Target Resolution Date: 11/27/2017 Goal Status: Active Interventions: Provide education on orientation to the wound center Notes: ` Wound/Skin Impairment Nursing Diagnoses: Impaired tissue  integrity Goals: Ulcer/skin breakdown will heal within 14 weeks Date Initiated: 09/10/2017 Target Resolution Date: 11/27/2017 Goal Status: Active Interventions: JOSETTE, SHIMABUKURO ([id number]) Assess patient/caregiver ability to obtain necessary supplies Assess patient/caregiver ability to perform ulcer/skin care regimen upon admission and as needed Assess ulceration(s) every visit Notes: Electronic Signature(s) Signed: 11/23/2017 5:11:14 PM By: Alric Quan Entered By: Alric Quan on 11/23/2017 10:21:11 Angela Patterson ([id number]) -------------------------------------------------------------------------------- Pain Assessment Details Patient Name: Angela Patterson. Date of Service: 11/23/2017 9:30 AM Medical Record Number: [id number] Patient Account Number: [id number] Date of Birth/Sex: August 11, 1920 (84 y.o. F) Treating RN: Cornell Barman Primary Care Jaycelynn Knickerbocker: Einar Pheasant Other Clinician: Referring Eliakim Tendler: Einar Pheasant Treating Joi Leyva/Extender: Melburn Hake, HOYT Weeks in Treatment: 10 Active Problems Location of Pain Severity and Description of Pain Patient Has Paino No Site Locations With Dressing Change: No Pain Management and Medication Current Pain Management: Electronic Signature(s) Signed: 11/24/2017 5:47:31 PM By: Gretta Cool, BSN, RN, CWS, Kim RN, BSN Entered By: Gretta Cool, BSN, RN, CWS, Kim on 11/23/2017 09:52:39 Angela Patterson ([id number]) -------------------------------------------------------------------------------- Patient/Caregiver Education Details Patient Name: Angela Patterson. Date of Service: 11/23/2017 9:30 AM Medical Record Number: [id number] Patient Account Number: [id number] Date of Birth/Gender: 1920/08/05 (84 y.o. F) Treating RN: Ahmed Prima Primary Care Physician: Einar Pheasant Other Clinician:  Referring Physician: Einar Pheasant Treating Physician/Extender: Sharalyn Ink in Treatment: 10 Education Assessment Education Provided  To: Patient Education Topics Provided Wound Debridement: Handouts: Wound Debridement Methods: Explain/Verbal Wound/Skin Impairment: Handouts: Caring for Your Ulcer Methods: Explain/Verbal Responses: State content correctly Electronic Signature(s) Signed: 12/15/2017 7:45:54 AM By: Harold Barban Entered By: Harold Barban on 11/23/2017 10:43:42 Angela Patterson ([id number]) -------------------------------------------------------------------------------- Wound Assessment Details Patient Name: Angela Patterson. Date of Service: 11/23/2017 9:30 AM Medical Record Number: [id number] Patient Account Number: [id number] Date of Birth/Sex: 1921/02/18 (84 y.o. F) Treating RN: Cornell Barman Primary Care Nolen Lindamood: Einar Pheasant Other Clinician: Referring Diann Bangerter: Einar Pheasant Treating Chelcie Estorga/Extender: Melburn Hake, HOYT Weeks in Treatment: 10 Wound Status Wound Number: 4 Primary Pressure Ulcer Etiology: Wound Location: Left Metatarsal head first - Plantar Wound Open Wounding Event: Pressure Injury Status: Date Acquired: 08/20/2017 Comorbid Cataracts, Glaucoma, Hypertension, Type II Weeks Of Treatment: 10 History: Diabetes, History of pressure wounds, Clustered Wound: No Neuropathy Photos Photo Uploaded By: Gretta Cool, BSN, RN, CWS, Kim on 11/23/2017 11:22:08 Wound Measurements Length: (cm) 0.4 Width: (cm) 0.5 Depth: (cm) 0.3 Area: (cm) 0.157 Volume: (cm) 0.047 % Reduction in Area: 91.1% % Reduction in Volume: 94.7% Epithelialization: None Tunneling: No Undermining: Yes Starting Position (o'clock): 8 Ending Position (o'clock): 3 Maximum Distance: (cm) 0.4 Wound Description Classification: Category/Stage IV Wound Margin: Indistinct, nonvisible Exudate Amount: Large Exudate Type: Serosanguineous Exudate Color: red, brown Foul Odor After Cleansing: No Slough/Fibrino No Wound Bed Granulation Amount: Medium (34-66%) Exposed Structure Granulation Quality: Red Fascia Exposed:  No Necrotic Amount: Medium (34-66%) Fat Layer (Subcutaneous Tissue) Exposed: Yes Necrotic Quality: Eschar, Adherent Slough Tendon Exposed: No Muscle Exposed: No EMMER, LILLIBRIDGE ([id number]) Joint Exposed: No Bone Exposed: No Periwound Skin Texture Texture Color No Abnormalities Noted: No No Abnormalities Noted: No Callus: Yes Atrophie Blanche: No Crepitus: No Cyanosis: No Excoriation: No Ecchymosis: No Induration: No Erythema: No Rash: No Hemosiderin Staining: No Scarring: No Mottled: No Pallor: No Moisture Rubor: No No Abnormalities Noted: No Dry / Scaly: No Temperature / Pain Maceration: No Temperature: No Abnormality Tenderness on Palpation: Yes Wound Preparation Ulcer Cleansing: Rinsed/Irrigated with Saline Topical Anesthetic Applied: Other: lidocaine 4%, Electronic Signature(s) Signed: 11/24/2017 5:47:31 PM By: Gretta Cool, BSN, RN, CWS, Kim RN, BSN Entered By: Gretta Cool, BSN, RN, CWS, Kim on 11/23/2017 09:58:08 Angela Patterson ([id number]) -------------------------------------------------------------------------------- Wound Assessment Details Patient Name: Angela Patterson. Date of Service: 11/23/2017 9:30 AM Medical Record Number: [id number] Patient Account Number: [id number] Date of Birth/Sex: 14-Oct-1920 (84 y.o. F) Treating RN: Cornell Barman Primary Care Trelyn Vanderlinde: Einar Pheasant Other Clinician: Referring Maresa Morash: Einar Pheasant Treating Analia Zuk/Extender: Melburn Hake, HOYT Weeks in Treatment: 10 Wound Status Wound Number: 5 Primary Etiology: Trauma, Other Wound Location: Left Forearm Wound Status: Healed - Epithelialized Wounding Event: Trauma Date Acquired: 10/09/2017 Weeks Of Treatment: 2 Clustered Wound: No Photos Photo Uploaded By: Gretta Cool, BSN, RN, CWS, Kim on 11/23/2017 11:22:28 Wound Measurements Length: (cm) 0 Width: (cm) 0 Depth: (cm) 0 Area: (cm) 0 Volume: (cm) 0 % Reduction in Area: 100% % Reduction in Volume: 100% Wound  Description Classification: Partial Thickness Periwound Skin Texture Texture Color No Abnormalities Noted: No No Abnormalities Noted: No Moisture No Abnormalities Noted: No Electronic Signature(s) Signed: 11/24/2017 5:47:31 PM By: Gretta Cool, BSN, RN, CWS, Kim RN, BSN Entered By: Gretta Cool, BSN, RN, CWS, Kim on 11/23/2017 09:58:36 Angela Patterson ([id number]) -------------------------------------------------------------------------------- [place] Details Patient Name: Angela Patterson. Date of Service: 11/23/2017 9:30 AM Medical Record Number: [id number] Patient Account Number: [id number] Date of Birth/Sex: 02-Nov-1920 (  84 y.o. F) Treating RN: Cornell Barman Primary Care Dontai Pember: Einar Pheasant Other Clinician: Referring Audreena Sachdeva: Einar Pheasant Treating Advit Trethewey/Extender: Melburn Hake, HOYT Weeks in Treatment: 10 Vital Signs Time Taken: 09:52 Temperature (F): 98.2 Height (in): 65 Pulse (bpm): 74 Weight (lbs): 107 Respiratory Rate (breaths/min): 16 Body Mass Index (BMI): 17.8 Blood Pressure (mmHg): 158/86 Reference Range: 80 - 120 mg / dl Electronic Signature(s) Signed: 11/24/2017 5:47:31 PM By: Gretta Cool, BSN, RN, CWS, Kim RN, BSN Entered By: Gretta Cool, BSN, RN, CWS, Kim on 11/23/2017 09:52:59

## 2017-12-21 ENCOUNTER — Encounter: Payer: Medicare Other | Attending: Physician Assistant | Admitting: Physician Assistant

## 2017-12-21 DIAGNOSIS — L89894 Pressure ulcer of other site, stage 4: Secondary | ICD-10-CM | POA: Diagnosis not present

## 2017-12-21 DIAGNOSIS — L97522 Non-pressure chronic ulcer of other part of left foot with fat layer exposed: Secondary | ICD-10-CM | POA: Diagnosis not present

## 2017-12-21 DIAGNOSIS — L97509 Non-pressure chronic ulcer of other part of unspecified foot with unspecified severity: Secondary | ICD-10-CM | POA: Diagnosis not present

## 2017-12-21 DIAGNOSIS — E1140 Type 2 diabetes mellitus with diabetic neuropathy, unspecified: Secondary | ICD-10-CM | POA: Diagnosis not present

## 2017-12-21 DIAGNOSIS — Z8673 Personal history of transient ischemic attack (TIA), and cerebral infarction without residual deficits: Secondary | ICD-10-CM | POA: Diagnosis not present

## 2017-12-21 DIAGNOSIS — I10 Essential (primary) hypertension: Secondary | ICD-10-CM | POA: Diagnosis not present

## 2017-12-21 DIAGNOSIS — E11621 Type 2 diabetes mellitus with foot ulcer: Secondary | ICD-10-CM | POA: Insufficient documentation

## 2017-12-21 DIAGNOSIS — H401131 Primary open-angle glaucoma, bilateral, mild stage: Secondary | ICD-10-CM | POA: Diagnosis not present

## 2017-12-21 LAB — HM DIABETES EYE EXAM

## 2017-12-22 NOTE — Progress Notes (Signed)
KYMARI, LOLLIS ([id number]) Visit Report for 12/21/2017 Arrival Information Details Patient Name: Angela Patterson, Angela Patterson. Date of Service: 12/21/2017 11:15 AM Medical Record Number: [id number] Patient Account Number: [id number] Date of Birth/Sex: 06-12-1921 (84 y.o. F) Treating RN: Secundino Ginger Primary Care Kadesia Robel: Einar Pheasant Other Clinician: Referring Alto Gandolfo: Einar Pheasant Treating Keniesha Adderly/Extender: Melburn Hake, HOYT Weeks in Treatment: 14 Visit Information History Since Last Visit Added or deleted any medications: No Patient Arrived: Ambulatory Any new allergies or adverse reactions: No Arrival Time: 11:40 Had a fall or experienced change in No Accompanied By: daughter activities of daily living that may affect Transfer Assistance: None risk of falls: Patient Requires Transmission-Based No Signs or symptoms of abuse/neglect since last visito No Precautions: Hospitalized since last visit: No Patient Has Alerts: Yes Implantable device outside of the clinic excluding No Patient Alerts: DMII cellular tissue based products placed in the center since last visit: Has Dressing in Place as Prescribed: Yes Pain Present Now: No Electronic Signature(s) Signed: 12/21/2017 11:57:38 AM By: Secundino Ginger Entered By: Secundino Ginger on 12/21/2017 11:41:25 Angela Patterson ([id number]) -------------------------------------------------------------------------------- Encounter Discharge Information Details Patient Name: Angela Patterson. Date of Service: 12/21/2017 11:15 AM Medical Record Number: [id number] Patient Account Number: [id number] Date of Birth/Sex: [birth year] (84 y.o. F) Treating RN: Roger Shelter Primary Care Keron Koffman: Einar Pheasant Other Clinician: Referring Samyia Motter: Einar Pheasant Treating Maddalynn Barnard/Extender: Melburn Hake, HOYT Weeks in Treatment: 14 Encounter Discharge Information Items Discharge Condition: Stable Ambulatory Status: Ambulatory Discharge Destination: Home Transportation:  Private Auto Accompanied By: daughter Schedule Follow-up Appointment: Yes Clinical Summary of Care: Provided Form Type Recipient Paper Patient eg Electronic Signature(s) Signed: 12/21/2017 12:17:35 PM By: Lorine Bears RCP, RRT, CHT Entered By: Becky Sax, Amado Nash on 12/21/2017 12:17:35 Angela Patterson ([id number]) -------------------------------------------------------------------------------- Lower Extremity Assessment Details Patient Name: Angela Patterson. Date of Service: 12/21/2017 11:15 AM Medical Record Number: [id number] Patient Account Number: [id number] Date of Birth/Sex: [birth year] (84 y.o. F) Treating RN: Secundino Ginger Primary Care Kieryn Burtis: Einar Pheasant Other Clinician: Referring Bedelia Pong: Einar Pheasant Treating Racer Quam/Extender: Melburn Hake, HOYT Weeks in Treatment: 14 Edema Assessment Assessed: [Left: No] [Right: No] Edema: [Left: N] [Right: o] Calf Left: Right: Point of Measurement: 29 cm From Medial Instep 26.5 cm cm Ankle Left: Right: Point of Measurement: 12 cm From Medial Instep 17.5 cm cm Vascular Assessment Claudication: Claudication Assessment [Left:None] Pulses: Dorsalis Pedis Palpable: [Left:Yes] Posterior Tibial Extremity colors, hair growth, and conditions: Extremity Color: [Left:Normal] Hair Growth on Extremity: [Left:No] Temperature of Extremity: [Left:Warm] Capillary Refill: [Left:< 3 seconds] Toe Nail Assessment Left: Right: Thick: Yes Discolored: Yes Deformed: Yes Improper Length and Hygiene: No Electronic Signature(s) Signed: 12/21/2017 11:57:38 AM By: Secundino Ginger Entered By: Secundino Ginger on 12/21/2017 11:52:29 Angela Patterson ([id number]) -------------------------------------------------------------------------------- Multi Wound Chart Details Patient Name: Angela Patterson. Date of Service: 12/21/2017 11:15 AM Medical Record Number: [id number] Patient Account Number: [id number] Date of Birth/Sex: 1921/06/03 (84 y.o.  F) Treating RN: Ahmed Prima Primary Care Nour Rodrigues: Einar Pheasant Other Clinician: Referring Kineta Fudala: Einar Pheasant Treating Leighanne Adolph/Extender: Melburn Hake, HOYT Weeks in Treatment: 14 Vital Signs Height(in): 65 Pulse(bpm): 75 Weight(lbs): 107 Blood Pressure(mmHg): 147/64 Body Mass Index(BMI): 18 Temperature(F): 97.6 Respiratory Rate 18 (breaths/min): Photos: [N/A:N/A] Wound Location: Left Metatarsal head first - N/A N/A Plantar Wounding Event: Pressure Injury N/A N/A Primary Etiology: Pressure Ulcer N/A N/A Comorbid History: Cataracts, Glaucoma, N/A N/A Hypertension, Type II Diabetes, History of pressure wounds, Neuropathy Date Acquired: 08/20/2017 N/A N/A Weeks of Treatment: 14 N/A N/A Wound Status: Open N/A N/A  Measurements L x W x D 0.2x0.2x0.2 N/A N/A (cm) Area (cm) : 0.031 N/A N/A Volume (cm) : 0.006 N/A N/A % Reduction in Area: 98.20% N/A N/A % Reduction in Volume: 99.30% N/A N/A Classification: Category/Stage IV N/A N/A Exudate Amount: Small N/A N/A Exudate Type: Serous N/A N/A Exudate Color: amber N/A N/A Wound Margin: Indistinct, nonvisible N/A N/A Granulation Amount: Large (67-100%) N/A N/A Granulation Quality: Red N/A N/A Necrotic Amount: Small (1-33%) N/A N/A Exposed Structures: Fat Layer (Subcutaneous N/A N/A Tissue) Exposed: Yes Fascia: No Tendon: No Angela Patterson, Angela Patterson ([id number]) Muscle: No Joint: No Bone: No Epithelialization: None N/A N/A Periwound Skin Texture: Callus: Yes N/A N/A Excoriation: No Induration: No Crepitus: No Rash: No Scarring: No Periwound Skin Moisture: Maceration: No N/A N/A Dry/Scaly: No Periwound Skin Color: Atrophie Blanche: No N/A N/A Cyanosis: No Ecchymosis: No Erythema: No Hemosiderin Staining: No Mottled: No Pallor: No Rubor: No Temperature: No Abnormality N/A N/A Tenderness on Palpation: Yes N/A N/A Wound Preparation: Ulcer Cleansing: N/A N/A Rinsed/Irrigated with Saline Topical Anesthetic  Applied: Other: lidocaine 4% Treatment Notes Electronic Signature(s) Signed: 12/21/2017 5:20:44 PM By: Alric Quan Entered By: Alric Quan on 12/21/2017 11:56:38 Angela Patterson ([id number]) -------------------------------------------------------------------------------- [place] Details Patient Name: Angela Patterson. Date of Service: 12/21/2017 11:15 AM Medical Record Number: [id number] Patient Account Number: [id number] Date of Birth/Sex: Jun 26, 1921 (84 y.o. F) Treating RN: Ahmed Prima Primary Care Elia Nunley: Einar Pheasant Other Clinician: Referring Tallon Gertz: Einar Pheasant Treating Yakir Wenke/Extender: Melburn Hake, HOYT Weeks in Treatment: 14 Active Inactive ` Abuse / Safety / Falls / Self Care Management Nursing Diagnoses: Potential for falls Goals: Patient will not experience any injury related to falls Date Initiated: 09/10/2017 Target Resolution Date: 11/27/2017 Goal Status: Active Interventions: Assess fall risk on admission and as needed Notes: ` Orientation to the Wound Care Program Nursing Diagnoses: Knowledge deficit related to the wound healing center program Goals: Patient/caregiver will verbalize understanding of the [place] Program Date Initiated: 09/10/2017 Target Resolution Date: 11/27/2017 Goal Status: Active Interventions: Provide education on orientation to the wound center Notes: ` Wound/Skin Impairment Nursing Diagnoses: Impaired tissue integrity Goals: Ulcer/skin breakdown will heal within 14 weeks Date Initiated: 09/10/2017 Target Resolution Date: 11/27/2017 Goal Status: Active Interventions: Angela Patterson, Angela Patterson ([id number]) Assess patient/caregiver ability to obtain necessary supplies Assess patient/caregiver ability to perform ulcer/skin care regimen upon admission and as needed Assess ulceration(s) every visit Notes: Electronic Signature(s) Signed: 12/21/2017 5:20:44 PM By: Alric Quan Entered By:  Alric Quan on 12/21/2017 11:56:26 Angela Patterson ([id number]) -------------------------------------------------------------------------------- Pain Assessment Details Patient Name: Angela Patterson. Date of Service: 12/21/2017 11:15 AM Medical Record Number: [id number] Patient Account Number: [id number] Date of Birth/Sex: [birth year] (84 y.o. F) Treating RN: Secundino Ginger Primary Care Faizon Capozzi: Einar Pheasant Other Clinician: Referring Leevi Cullars: Einar Pheasant Treating Rashada Klontz/Extender: Melburn Hake, HOYT Weeks in Treatment: 14 Active Problems Location of Pain Severity and Description of Pain Patient Has Paino No Site Locations Pain Management and Medication Current Pain Management: Goals for Pain Management Topical or injectable lidocaine is offered to patient for acute pain when surgical debridement is performed. If needed, Patient is instructed to use over the counter pain medication for the following 24-48 hours after debridement. Wound care MDs do not prescribed pain medications. Patient has chronic pain or uncontrolled pain. Patient has been instructed to make an appointment with their Primary Care Physician for pain management Electronic Signature(s) Signed: 12/21/2017 11:57:38 AM By: Secundino Ginger Entered By: Secundino Ginger on 12/21/2017 11:42:47 Angela Patterson ([id number]) --------------------------------------------------------------------------------  Patient/Caregiver Education Details Patient Name: Angela Patterson, Angela Patterson. Date of Service: 12/21/2017 11:15 AM Medical Record Number: [id number] Patient Account Number: [id number] Date of Birth/Gender: May 18, 1921 (84 y.o. F) Treating RN: Roger Shelter Primary Care Physician: Einar Pheasant Other Clinician: Referring Physician: Einar Pheasant Treating Physician/Extender: Sharalyn Ink in Treatment: 14 Education Assessment Education Provided To: Patient Education Topics Provided Wound Debridement: Handouts: Wound  Debridement Methods: Explain/Verbal Responses: State content correctly Wound/Skin Impairment: Handouts: Caring for Your Ulcer Methods: Explain/Verbal Responses: State content correctly Electronic Signature(s) Signed: 12/21/2017 12:56:26 PM By: Roger Shelter Entered By: Roger Shelter on 12/21/2017 12:14:10 Angela Patterson ([id number]) -------------------------------------------------------------------------------- Wound Assessment Details Patient Name: Angela Patterson. Date of Service: 12/21/2017 11:15 AM Medical Record Number: [id number] Patient Account Number: [id number] Date of Birth/Sex: Dec 14, 1920 (84 y.o. F) Treating RN: Secundino Ginger Primary Care Nason Conradt: Einar Pheasant Other Clinician: Referring Mohogany Toppins: Einar Pheasant Treating Minnie Legros/Extender: Melburn Hake, HOYT Weeks in Treatment: 14 Wound Status Wound Number: 4 Primary Pressure Ulcer Etiology: Wound Location: Left Metatarsal head first - Plantar Wound Open Wounding Event: Pressure Injury Status: Date Acquired: 08/20/2017 Comorbid Cataracts, Glaucoma, Hypertension, Type II Weeks Of Treatment: 14 History: Diabetes, History of pressure wounds, Clustered Wound: No Neuropathy Photos Wound Measurements Length: (cm) 0.2 Width: (cm) 0.2 Depth: (cm) 0.2 Area: (cm) 0.031 Volume: (cm) 0.006 % Reduction in Area: 98.2% % Reduction in Volume: 99.3% Epithelialization: None Tunneling: No Undermining: No Wound Description Classification: Category/Stage IV Wound Margin: Indistinct, nonvisible Exudate Amount: Large Exudate Type: Serous Exudate Color: amber Foul Odor After Cleansing: No Slough/Fibrino No Wound Bed Granulation Amount: Large (67-100%) Exposed Structure Granulation Quality: Red Fascia Exposed: No Necrotic Amount: Small (1-33%) Fat Layer (Subcutaneous Tissue) Exposed: Yes Necrotic Quality: Adherent Slough Tendon Exposed: No Muscle Exposed: No Joint Exposed: No Bone Exposed: No Periwound Skin  Texture Texture Color Angela Patterson, Angela Patterson ([id number]) No Abnormalities Noted: No No Abnormalities Noted: No Callus: Yes Atrophie Blanche: No Crepitus: No Cyanosis: No Excoriation: No Ecchymosis: No Induration: No Erythema: No Rash: No Hemosiderin Staining: No Scarring: No Mottled: No Pallor: No Moisture Rubor: No No Abnormalities Noted: No Dry / Scaly: No Temperature / Pain Maceration: No Temperature: No Abnormality Tenderness on Palpation: Yes Wound Preparation Ulcer Cleansing: Rinsed/Irrigated with Saline Topical Anesthetic Applied: Other: lidocaine 4%, Treatment Notes Wound #4 (Left, Plantar Metatarsal head first) 1. Cleansed with: Clean wound with Normal Saline 2. Anesthetic Topical Lidocaine 4% cream to wound bed prior to debridement 4. Dressing Applied: Other dressing (specify in notes) 5. Secondary Dressing Applied [place] Notes endoform, cut foam to fit around wound Electronic Signature(s) Signed: 12/21/2017 4:12:24 PM By: Montey Hora Signed: 12/21/2017 4:18:40 PM By: Secundino Ginger Previous Signature: 12/21/2017 11:57:38 AM Version By: Secundino Ginger Entered By: Montey Hora on 12/21/2017 16:12:23 Angela Patterson ([id number]) -------------------------------------------------------------------------------- [place] Details Patient Name: Angela Patterson. Date of Service: 12/21/2017 11:15 AM Medical Record Number: [id number] Patient Account Number: [id number] Date of Birth/Sex: September 21, 1920 (84 y.o. F) Treating RN: Secundino Ginger Primary Care Katerra Ingman: Einar Pheasant Other Clinician: Referring Ryatt Corsino: Einar Pheasant Treating [place]/Extender: Melburn Hake, HOYT Weeks in Treatment: 14 Vital Signs Time Taken: 11:30 Temperature (F): 97.6 Height (in): 65 Pulse (bpm): 75 Weight (lbs): 107 Respiratory Rate (breaths/min): 18 Body Mass Index (BMI): 17.8 Blood Pressure (mmHg): 147/64 Reference Range: 80 - 120 mg / dl Electronic Signature(s) Signed: 12/21/2017 11:57:38  AM By: Secundino Ginger Entered By: Secundino Ginger on 12/21/2017 11:43:39

## 2017-12-24 NOTE — Progress Notes (Signed)
WILLELLA, HARDING ([id number]) Visit Report for 12/21/2017 Chief Complaint Document Details Patient Name: Angela Patterson, Angela Patterson. Date of Service: 12/21/2017 11:15 AM Medical Record Number: [id number] Patient Account Number: [id number] Date of Birth/Sex: [birth year] (84 y.o. F) Treating RN: Ahmed Prima Primary Care Provider: Einar Pheasant Other Clinician: Referring Provider: Einar Pheasant Treating Provider/Extender: Melburn Hake, HOYT Weeks in Treatment: 14 Information Obtained from: Patient Chief Complaint Left plantar foot ulcer Electronic Signature(s) Signed: 12/22/2017 8:38:06 AM By: Worthy Keeler PA-C Entered By: Worthy Keeler on 12/21/2017 11:13:17 Angela Patterson ([id number]) -------------------------------------------------------------------------------- Debridement Details Patient Name: Angela Patterson. Date of Service: 12/21/2017 11:15 AM Medical Record Number: [id number] Patient Account Number: [id number] Date of Birth/Sex: [birth year] (84 y.o. F) Treating RN: Ahmed Prima Primary Care Provider: Einar Pheasant Other Clinician: Referring Provider: Einar Pheasant Treating Provider/Extender: Melburn Hake, HOYT Weeks in Treatment: 14 Debridement Performed for Wound #4 Left,Plantar Metatarsal head first Assessment: Performed By: Physician STONE III, HOYT E., PA-C Debridement Type: Debridement Pre-procedure Verification/Time Yes - 11:57 Out Taken: Start Time: 11:57 Pain Control: Lidocaine 4% Topical Solution Total Area Debrided (L x W): 0.2 (cm) x 0.2 (cm) = 0.04 (cm) Tissue and other material Viable, Non-Viable, Callus, Slough, Subcutaneous, Fibrin/Exudate, Slough debrided: Level: Skin/Subcutaneous Tissue Debridement Description: Excisional Instrument: Curette Bleeding: Minimum Hemostasis Achieved: Pressure End Time: 12:02 Procedural Pain: 0 Post Procedural Pain: 0 Response to Treatment: Procedure was tolerated well Level of Consciousness: Awake and Alert Post Procedure  Vitals: Temperature: 97.6 Pulse: 75 Respiratory Rate: 18 Blood Pressure: Systolic Blood Pressure: 956 Diastolic Blood Pressure: 64 Post Debridement Measurements of Total Wound Length: (cm) 0.3 Stage: Category/Stage IV Width: (cm) 0.3 Depth: (cm) 0.2 Volume: (cm) 0.014 Character of Wound/Ulcer Post Requires Further Debridement Debridement: Post Procedure Diagnosis Same as Pre-procedure Electronic Signature(s) Signed: 12/21/2017 5:20:44 PM By: Alric Quan Signed: 12/22/2017 8:38:06 AM By: Worthy Keeler PA-C Entered By: Alric Quan on 12/21/2017 12:02:58 Angela Patterson ([name] ([id number]) -------------------------------------------------------------------------------- HPI Details Patient Name: Angela Patterson. Date of Service: 12/21/2017 11:15 AM Medical Record Number: [id number] Patient Account Number: [id number] Date of Birth/Sex: 10/05/1920 (84 y.o. F) Treating RN: Ahmed Prima Primary Care Provider: Einar Pheasant Other Clinician: Referring Provider: Einar Pheasant Treating Provider/Extender: Melburn Hake, HOYT Weeks in Treatment: 14 History of Present Illness HPI Description: this very pleasant 84 year old patient who is extremely active and ambulating all day has had problems with the left foot for over a year. Her daughter who is her caregiver says she drives around and even goes around and does her chores by herself. this is the third recurrence and after the last time we saw in June and discharged her her daughter has got her to pace of diabetic shoes and she's been wearing these regularly. She has been on the shoes for about 6 months now. Her diabetes is under control and her blood sugars run from a anywhere between 110 to 130 and she checks them twice a day. no fever or discharge. During her initial visit, an x-ray of her left foot was done on 09/26/2014. There was no cortical erosion noted there was mild osteopenia in the distal first  metatarsal. Radiologist has recommended a MRI of the left foot to exclude osteomyelitis. MRI done on 10/09/2014 reveals that the area where she had a phlegmon is without osteomyelitis. the bone marrow and other areas are compatible with reactive edema rather than osteomyelitis. 08/06/2015 -- x-ray of the left foot done on 07/30/2015 shows IMPRESSION:Soft tissue wound with a bandage  is noted over the plantar aspect of the distal left foot. Diffuse osteopenia degenerative change. No acute bony abnormality 09/16/2015 -- last week she had gone to [facility] and met with Harrington Challenger who had spoken to me over the phone. Instead of a DH walking boot he has given her an offloading boot which will make her more steady and this boot is called DH healing sandal with a custom-made insole to offload this area. Readmission: 09/10/17 on evaluation today patient presents for reevaluation in regard to her left plantar foot ulcer has actually been since 2017 since we have last seen her. With that being said she has seem to do fairly well in the interim since that time. Patient is seen with both her daughter as well as her son present for today's evaluation. Her son tells me that they have ordered diabetic shoes for her which obviously are custom-made and that they were hoping these would be available today to bring in. With that being said she does also have some of the boots left that she had previously for offloading and they brought those with them today as well. With that being said up to this point patient has just been using her tennis shoes in regard to her footwear. She has had drainage from the ulcer and it really does not seem to be improving significantly at this time. She does have a history of diabetes mellitus type II which she is on medication and she is also on a blood thinner. Fortunately she does not seem to have any evidence of discomfort due to the diabetic neuropathy.  Patient does have a very high arch which is why she seems to get pressure to this area of the first metatarsal on the left foot leading to the ulcerations that develop. The current ulceration is in the exact location of her prior ulceration. She has not had a recent x-ray. 09/17/17 On evaluation today patient appears to be doing well in regard to her left plantar foot ulcer. Since last week she has had excellent filling in of the wound and this is definitely much smaller than previously noted. With that being said she is having no pain she has neuropathy and therefore does not feel any pain anyway. There's no evidence of infection which is great news. She does have callous but again this is not nearly as significant as previously noted. Overall I'm definitely pleased with how things stand today. 09/24/17 on evaluation today patient's left plantar foot ulcer appears to show signs of good improvement compared even to last week's evaluation. She is slowly healing and again from the first time I saw this this definitely appears to be doing much better. She's not have any discomfort which is great news unfortunately most of this is actually due to the fact that she has neuropathy. Other than that however she has been tolerating the dressing changes without complication she actually performs the dressing changes herself. CHANON, LONEY ([id number]) 10/08/17 on evaluation today patient's foot ulcer on the right actually appears to be doing a little bit worse unfortunately. She has been tolerating the dressing changes fairly well she performs these herself. Although I'm unsure that she has really been packing the alginate into the wound I have discussed this with her before but it sounds as if she may not be. Nonetheless I did have a discussion concerning this again today with the daughter present as well as her son and other daughter whom I just  met today. Patient did get her custom shoes which finally arrived  which is definitely good news. These do appear to be excellent I did check them out in the offloading as well as our support is great. 10/15/17 on evaluation today patient appears to be doing little better compared to last week's evaluation. She does have a little bit of bruising on the lateral border of the wound which seems to be indicative of some pressure occurring at that site. With that being said this is most likely as a result of the dressing slipping apparently with the new shoes this is not been staying in place as well. That's one thing we are going to have to continue to keep an eye on and see if there's any issues in that regard. Nonetheless we may need to go back to the offloading boots until we get this healed and then have her transfer back to her custom shoes depend on how things progress. Nonetheless at this time I do not see any definite or permanent damage and again she does seem to be doing a little bit better in my opinion. 10/22/17 on evaluation today patient actually appears to be doing rather well in regard to her left foot ulcer. The dressing has been staying on better which is good news. Subsequently the patient also has been utilizing her diabetic shoes with good result as well. Overall I think it is helpful for her now that the dressings are staying in place there appears to be no new injury and the wound of the ball is showing signs of improving. 11/09/17 on evaluation today patient appears to be doing better in regard to her left plantar foot ulcer. She has much less callous buildup this week compared to last time that I evaluated her two weeks prior. Overall I feel like this is definitely showing signs of improvement which is good news. There does not appear to be any new injury to the foot which is also excellent news. Overall I feel like that her shoes for offloading are doing in great job. Overall I'm pleased with the progress. She does have an area on her left forearm  which has previously been evaluated at the walk-in urgent care. She states that it seems to have done pretty well but her son would like for [place] to evaluate this further and ensure there is nothing that appears to be infected or otherwise causing her trouble. 11/23/17 on evaluation today patient's wound actually appears to be doing fairly well at this point. She has been tolerating the dressing changes without complication. She does seem to be making some progress in regard to the overall quality and size of the wound in my opinion although she still develop some callous there is no evidence of deep tissue injury and most of the dark discoloration seems to be working its way out at this point. There's no evidence of infection. 12/07/17 on evaluation today patient appears to be doing great in regard to her left foot ulcer. Unfortunately the only thing I see that's an issue at this point is she's not really noting a lot of granulation and filling in at this time which I think we need to work on. This may require dressing change I don't think she needs the alginate anymore she's not having as much drainage as she had in the past. Therefore we're gonna see about doing something different as of today. 12/21/17 on evaluation today patient's foot ulcer actually appears to be doing rather  well in regard to the right plantar foot. She has been wearing her offloading shoes which I think do seem to be helping. Subsequently she also has been doing excellent when it comes to keeping the dressing clean and dry as well is changing it on a regular basis which again she does on her own accord. Overall I'm extremely happy with what I'm seeing at this time. Electronic Signature(s) Signed: 12/22/2017 8:38:06 AM By: Worthy Keeler PA-C Entered By: Worthy Keeler on 12/22/2017 02:09:59 Angela Patterson ([id number]) -------------------------------------------------------------------------------- Physical Exam Details Patient  Name: Angela Patterson. Date of Service: 12/21/2017 11:15 AM Medical Record Number: [id number] Patient Account Number: [id number] Date of Birth/Sex: 27-May-1921 (84 y.o. F) Treating RN: Ahmed Prima Primary Care Provider: Einar Pheasant Other Clinician: Referring Provider: Einar Pheasant Treating Provider/Extender: Melburn Hake, HOYT Weeks in Treatment: 73 Constitutional Well-nourished and well-hydrated in no acute distress. Respiratory normal breathing without difficulty. Psychiatric this patient is able to make decisions and demonstrates good insight into disease process. Alert and Oriented x 3. pleasant and cooperative. Notes Patient's wound bed did have callous surrounding and she did have some Slough noted on the surface of the wound. This was actually sharply debrided away today without complication and post debridement the wound bed appears to be doing excellent. Electronic Signature(s) Signed: 12/22/2017 8:38:06 AM By: Worthy Keeler PA-C Entered By: Worthy Keeler on 12/22/2017 02:10:57 Angela Patterson ([id number]) -------------------------------------------------------------------------------- Physician Orders Details Patient Name: Angela Patterson. Date of Service: 12/21/2017 11:15 AM Medical Record Number: [id number] Patient Account Number: [id number] Date of Birth/Sex: Sep 03, 1920 (84 y.o. F) Treating RN: Ahmed Prima Primary Care Provider: Einar Pheasant Other Clinician: Referring Provider: Einar Pheasant Treating Provider/Extender: Melburn Hake, HOYT Weeks in Treatment: 14 Verbal / Phone Orders: Yes Clinician: Carolyne Fiscal, Debi Read Back and Verified: Yes Diagnosis Coding ICD-10 Coding Code Description E11.621 Type 2 diabetes mellitus with foot ulcer E11.40 Type 2 diabetes mellitus with diabetic neuropathy, unspecified L97.522 Non-pressure chronic ulcer of other part of left foot with fat layer exposed S51.802A Unspecified open wound of left forearm, initial  encounter L84 Corns and callosities I10 Essential (primary) hypertension Wound Cleansing Wound #4 Left,Plantar Metatarsal head first o Clean wound with Normal Saline. o Cleanse wound with mild soap and water o May Shower, gently pat wound dry prior to applying new dressing. Anesthetic (add to Medication List) Wound #4 Left,Plantar Metatarsal head first o Topical Lidocaine 4% cream applied to wound bed prior to debridement (In Clinic Only). Skin Barriers/Peri-Wound Care Wound #4 Left,Plantar Metatarsal head first o Skin Prep Primary Wound Dressing Wound #4 Left,Plantar Metatarsal head first o Other: - Endoform Secondary Dressing Wound #4 Left,Plantar Metatarsal head first o Boardered Foam Dressing - or [place] with foam for padding o Foam o Other - or Alldress Dressing Change Frequency Wound #4 Left,Plantar Metatarsal head first o Change dressing every other day. Follow-up Appointments Wound #4 Left,Plantar Metatarsal head first o Return Appointment in 2 weeks. SABA, GOMM ([id number]) Additional Orders / Instructions Wound #4 Left,Plantar Metatarsal head first o Increase protein intake. o Activity as tolerated Patient Medications Allergies: Iodinated Contrast Media - IV Dye Notifications Medication Indication Start End lidocaine DOSE 1 - topical 4 % cream - 1 cream topical Electronic Signature(s) Signed: 12/21/2017 5:20:44 PM By: Alric Quan Signed: 12/22/2017 8:38:06 AM By: Worthy Keeler PA-C Entered By: Alric Quan on 12/21/2017 12:04:03 Angela Patterson ([id number]) -------------------------------------------------------------------------------- Prescription 12/21/2017 Patient Name: Angela Patterson. Provider: Worthy Keeler PA-C Date  of Birth: [birth year] NPI#: [id number] Sex: F DEA#: [id number] Phone #: [phone] License #: Patient Address: [facility] [place], [place] [address] [address] [place], [place] [address] [phone] Allergies Iodinated Contrast Media - IV Dye Medication Medication: Route: Strength: Form: lidocaine topical 4% cream Class: TOPICAL LOCAL ANESTHETICS Dose: Frequency / Time: Indication: 1 1 cream topical Number of Refills: Number of Units: 0 Generic Substitution: Start Date: End Date: Administered at [place]: Yes Time Administered: Time Discontinued: Note to Pharmacy: Signature(s): Date(s): Electronic Signature(s) Signed: 12/21/2017 5:20:44 PM By: Alric Quan Signed: 12/22/2017 8:38:06 AM By: Worthy Keeler PA-C Entered By: Alric Quan on 12/21/2017 12:04:03 Angela Patterson ([name] ([id number]) --------------------------------------------------------------------------------  Problem List Details Patient Name: Angela Patterson. Date of Service: 12/21/2017 11:15 AM Medical Record Number: [id number] Patient Account Number: [id number] Date of Birth/Sex: 08-Apr-1921 (84 y.o. F) Treating RN: Ahmed Prima Primary Care Provider: Einar Pheasant Other Clinician: Referring Provider: Einar Pheasant Treating Provider/Extender: Worthy Keeler Weeks in Treatment: 14 Active Problems ICD-10 Impacting Encounter Code Description Active Date Wound Healing Diagnosis E11.621 Type 2 diabetes mellitus with foot ulcer 09/10/2017 No Yes E11.40 Type 2 diabetes mellitus with diabetic neuropathy, 09/10/2017 No Yes unspecified L97.522 Non-pressure chronic ulcer of other part of left foot with fat 09/10/2017 No Yes layer exposed S51.802A Unspecified open wound of left forearm, initial encounter 11/09/2017 No Yes L84 Corns and callosities 09/10/2017 No Yes I10 Essential (primary) hypertension 09/10/2017 No Yes Inactive Problems Resolved Problems Electronic Signature(s) Signed: 12/22/2017 8:38:06 AM By: Worthy Keeler PA-C Entered By:  Worthy Keeler on 12/21/2017 11:13:08 Angela Patterson ([id number]) -------------------------------------------------------------------------------- Progress Note Details Patient Name: Angela Patterson. Date of Service: 12/21/2017 11:15 AM Medical Record Number: [id number] Patient Account Number: [id number] Date of Birth/Sex: [birth year] (84 y.o. F) Treating RN: Ahmed Prima Primary Care Provider: Einar Pheasant Other Clinician: Referring Provider: Einar Pheasant Treating Provider/Extender: Melburn Hake, HOYT Weeks in Treatment: 14 Subjective Chief Complaint Information obtained from Patient Left plantar foot ulcer History of Present Illness (HPI) this very pleasant 84 year old patient who is extremely active and ambulating all day has had problems with the left foot for over a year. Her daughter who is her caregiver says she drives around and even goes around and does her chores by herself. this is the third recurrence and after the last time we saw in June and discharged her her daughter has got her to pace of diabetic shoes and she's been wearing these regularly. She has been on the shoes for about 6 months now. Her diabetes is under control and her blood sugars run from a anywhere between 110 to 130 and she checks them twice a day. no fever or discharge. During her initial visit, an x-ray of her left foot was done on 09/26/2014. There was no cortical erosion noted there was mild osteopenia in the distal first metatarsal. Radiologist has recommended a MRI of the left foot to exclude osteomyelitis. MRI done on 10/09/2014 reveals that the area where she had a phlegmon is without osteomyelitis. the bone marrow and other areas are compatible with reactive edema rather than osteomyelitis. 08/06/2015 -- x-ray of the left foot done on 07/30/2015 shows IMPRESSION:Soft tissue wound with a bandage is noted over the plantar aspect of the distal left foot. Diffuse osteopenia degenerative change. No  acute bony abnormality 09/16/2015 -- last week she had gone to [facility] and met with Harrington Challenger who had spoken to me over the phone. Instead of a DH walking boot he has given her an offloading boot which will make her more steady and this boot is called DH healing sandal with a custom-made insole to offload this area. Readmission: 09/10/17 on evaluation today patient presents for reevaluation in regard to her left plantar foot ulcer has actually been since 2017 since we have last seen her. With that being said she has seem to do fairly well in the interim since that time. Patient is seen with both her daughter as well as her son present for today's evaluation. Her son tells me that they have ordered diabetic shoes for her which obviously are custom-made and that they were hoping these would be available today to bring in. With that being said she does also have some of the boots left that she had previously for offloading and they brought those with them today as well. With that being said up to this point patient has just been using her tennis shoes in regard to her footwear. She has had drainage from the ulcer and it really does not seem to be improving significantly at this time. She does have a history of diabetes mellitus type II which she is on medication and she is also on a blood thinner. Fortunately she does not seem to have any evidence of discomfort due to the diabetic neuropathy. Patient does have a very high arch which is why she seems to get pressure to this area of the first metatarsal on the left foot leading to the ulcerations that develop. The current ulceration is in the exact location of her prior ulceration. She has not had a recent x-ray. 09/17/17 On evaluation today patient appears to be doing well in regard to her left plantar foot ulcer. Since last week she has had excellent filling in of the wound and this is definitely much smaller than  previously noted. With that being said she is having no pain she has neuropathy and therefore does not feel any pain anyway. There's no evidence of infection which is great news. She does have callous but again this is not nearly as significant as previously noted. Overall I'm definitely pleased with how things stand today. TAKESHIA, WENK ([id number]) 09/24/17 on evaluation today patient's left plantar foot ulcer appears to show signs of good improvement compared even to last week's evaluation. She is slowly healing and again from the first time I saw this this definitely appears to be doing much better. She's not have any discomfort which is great news unfortunately most of this is actually due to the fact that she has neuropathy. Other than that however she has been tolerating the dressing changes without complication she actually performs the dressing changes herself. 10/08/17 on evaluation today patient's foot ulcer on the right actually appears to be doing a little bit worse unfortunately. She has been tolerating the dressing changes fairly well she performs these herself. Although I'm unsure that she has really been packing the alginate into the wound I have discussed this with her before but it sounds as if she may not be. Nonetheless I did have a discussion concerning this again today with the daughter present as well as her son and other daughter whom I just met today. Patient did get her custom shoes which finally arrived which is definitely good news. These do appear to be excellent I did check them out in the offloading as  well as our support is great. 10/15/17 on evaluation today patient appears to be doing little better compared to last week's evaluation. She does have a little bit of bruising on the lateral border of the wound which seems to be indicative of some pressure occurring at that site. With that being said this is most likely as a result of the dressing slipping apparently with  the new shoes this is not been staying in place as well. That's one thing we are going to have to continue to keep an eye on and see if there's any issues in that regard. Nonetheless we may need to go back to the offloading boots until we get this healed and then have her transfer back to her custom shoes depend on how things progress. Nonetheless at this time I do not see any definite or permanent damage and again she does seem to be doing a little bit better in my opinion. 10/22/17 on evaluation today patient actually appears to be doing rather well in regard to her left foot ulcer. The dressing has been staying on better which is good news. Subsequently the patient also has been utilizing her diabetic shoes with good result as well. Overall I think it is helpful for her now that the dressings are staying in place there appears to be no new injury and the wound of the ball is showing signs of improving. 11/09/17 on evaluation today patient appears to be doing better in regard to her left plantar foot ulcer. She has much less callous buildup this week compared to last time that I evaluated her two weeks prior. Overall I feel like this is definitely showing signs of improvement which is good news. There does not appear to be any new injury to the foot which is also excellent news. Overall I feel like that her shoes for offloading are doing in great job. Overall I'm pleased with the progress. She does have an area on her left forearm which has previously been evaluated at the walk-in urgent care. She states that it seems to have done pretty well but her son would like for [place] to evaluate this further and ensure there is nothing that appears to be infected or otherwise causing her trouble. 11/23/17 on evaluation today patient's wound actually appears to be doing fairly well at this point. She has been tolerating the dressing changes without complication. She does seem to be making some progress in regard to  the overall quality and size of the wound in my opinion although she still develop some callous there is no evidence of deep tissue injury and most of the dark discoloration seems to be working its way out at this point. There's no evidence of infection. 12/07/17 on evaluation today patient appears to be doing great in regard to her left foot ulcer. Unfortunately the only thing I see that's an issue at this point is she's not really noting a lot of granulation and filling in at this time which I think we need to work on. This may require dressing change I don't think she needs the alginate anymore she's not having as much drainage as she had in the past. Therefore we're gonna see about doing something different as of today. 12/21/17 on evaluation today patient's foot ulcer actually appears to be doing rather well in regard to the right plantar foot. She has been wearing her offloading shoes which I think do seem to be helping. Subsequently she also has been doing excellent when  it comes to keeping the dressing clean and dry as well is changing it on a regular basis which again she does on her own accord. Overall I'm extremely happy with what I'm seeing at this time. Patient History Information obtained from Patient. Family History Cancer - Father, Diabetes - Siblings, Stroke - Mother, No family history of Heart Disease, Hereditary Spherocytosis, Hypertension, Kidney Disease, Lung Disease, Seizures, Thyroid Problems, Tuberculosis. Social History MARRIETTA, Angela Patterson ([id number]) Never smoker, Marital Status - Widowed, Alcohol Use - Never, Drug Use - No History, Caffeine Use - Daily. Medical And Surgical History Notes Cardiovascular TIA Neurologic TIA Review of Systems (ROS) Constitutional Symptoms (General Health) Denies complaints or symptoms of Fever, Chills. Respiratory The patient has no complaints or symptoms. Cardiovascular The patient has no complaints or symptoms. Psychiatric The  patient has no complaints or symptoms. Objective Constitutional Well-nourished and well-hydrated in no acute distress. Vitals Time Taken: 11:30 AM, Height: 65 in, Weight: 107 lbs, BMI: 17.8, Temperature: 97.6 F, Pulse: 75 bpm, Respiratory Rate: 18 breaths/min, Blood Pressure: 147/64 mmHg. Respiratory normal breathing without difficulty. Psychiatric this patient is able to make decisions and demonstrates good insight into disease process. Alert and Oriented x 3. pleasant and cooperative. General Notes: Patient's wound bed did have callous surrounding and she did have some Slough noted on the surface of the wound. This was actually sharply debrided away today without complication and post debridement the wound bed appears to be doing excellent. Integumentary (Hair, Skin) Wound #4 status is Open. Original cause of wound was Pressure Injury. The wound is located on the Left,Plantar Metatarsal head first. The wound measures 0.2cm length x 0.2cm width x 0.2cm depth; 0.031cm^2 area and 0.006cm^3 volume. There is Fat Layer (Subcutaneous Tissue) Exposed exposed. There is no tunneling or undermining noted. There is a large amount of serous drainage noted. The wound margin is indistinct and nonvisible. There is large (67-100%) red granulation within the wound bed. There is a small (1-33%) amount of necrotic tissue within the wound bed including Adherent Slough. The periwound skin appearance exhibited: Callus. The periwound skin appearance did not exhibit: Crepitus, Excoriation, Induration, Rash, Scarring, Dry/Scaly, Maceration, Atrophie Blanche, Cyanosis, Ecchymosis, Hemosiderin Staining, Mottled, Pallor, Rubor, Erythema. Periwound temperature was noted as No Abnormality. The periwound has tenderness on palpation. RASHA, IBE ([id number]) Assessment Active Problems ICD-10 Type 2 diabetes mellitus with foot ulcer Type 2 diabetes mellitus with diabetic neuropathy, unspecified Non-pressure  chronic ulcer of other part of left foot with fat layer exposed Unspecified open wound of left forearm, initial encounter Corns and callosities Essential (primary) hypertension Procedures Wound #4 Pre-procedure diagnosis of Wound #4 is a Pressure Ulcer located on the Left,Plantar Metatarsal head first . There was a Excisional Skin/Subcutaneous Tissue Debridement with a total area of 0.04 sq cm performed by STONE III, HOYT E., PA-C. With the following instrument(s): Curette to remove Viable and Non-Viable tissue/material. Material removed includes Callus, Subcutaneous Tissue, Slough, and Fibrin/Exudate after achieving pain control using Lidocaine 4% Topical Solution. A time out was conducted at 11:57, prior to the start of the procedure. A Minimum amount of bleeding was controlled with Pressure. The procedure was tolerated well with a pain level of 0 throughout and a pain level of 0 following the procedure. Patient s Level of Consciousness post procedure was recorded as Awake and Alert. Post Debridement Measurements: 0.3cm length x 0.3cm width x 0.2cm depth; 0.014cm^3 volume. Post debridement Stage noted as Category/Stage IV. Character of Wound/Ulcer Post Debridement requires further debridement. Post  procedure Diagnosis Wound #4: Same as Pre-Procedure Plan Wound Cleansing: Wound #4 Left,Plantar Metatarsal head first: Clean wound with Normal Saline. Cleanse wound with mild soap and water May Shower, gently pat wound dry prior to applying new dressing. Anesthetic (add to Medication List): Wound #4 Left,Plantar Metatarsal head first: Topical Lidocaine 4% cream applied to wound bed prior to debridement (In Clinic Only). Skin Barriers/Peri-Wound Care: Wound #4 Left,Plantar Metatarsal head first: Skin Prep Primary Wound Dressing: Wound #4 Left,Plantar Metatarsal head first: Other: - Endoform Secondary Dressing: Wound #4 Left,Plantar Metatarsal head first: Boardered Foam Dressing - or [place] with foam for padding Foam Angela Patterson ([id number]) Other - or Alldress Dressing Change Frequency: Wound #4 Left,Plantar Metatarsal head first: Change dressing every other day. Follow-up Appointments: Wound #4 Left,Plantar Metatarsal head first: Return Appointment in 2 weeks. Additional Orders / Instructions: Wound #4 Left,Plantar Metatarsal head first: Increase protein intake. Activity as tolerated The following medication(s) was prescribed: lidocaine topical 4 % cream 1 1 cream topical was prescribed at facility I am going to suggest currently that we continue with the wound care measures that are in place she's in agreement with this plan as is her daughter. We will see were things stand at follow-up in two weeks time. Please see above for specific wound care orders. We will see patient for re-evaluation in 2 week(s) here in the clinic. If anything worsens or changes patient will contact our office for additional recommendations. Electronic Signature(s) Signed: 12/22/2017 8:38:06 AM By: Worthy Keeler PA-C Entered By: Worthy Keeler on 12/22/2017 02:11:21 Angela Patterson ([id number]) -------------------------------------------------------------------------------- ROS/PFSH Details Patient Name: Angela Patterson. Date of Service: 12/21/2017 11:15 AM Medical Record Number: [id number] Patient Account Number: [id number] Date of Birth/Sex: 21-Nov-1920 (84 y.o. F) Treating RN: Ahmed Prima Primary Care Provider: Einar Pheasant Other Clinician: Referring Provider: Einar Pheasant Treating Provider/Extender: Melburn Hake, HOYT Weeks in Treatment: 14 Information Obtained From Patient Wound History Do you currently have one or more open woundso Yes How many open wounds do you currently haveo 1 Approximately how long have you had your woundso one month open How have you been treating your wound(s) until nowo wound cleanser Has your wound(s) ever healed and then re-openedo Yes Have  you had any lab work done in the past montho No Have you tested positive for an antibiotic resistant organism (MRSA, VRE)o No Have you tested positive for osteomyelitis (bone infection)o No Have you had any tests for circulation on your legso No Constitutional Symptoms (General Health) Complaints and Symptoms: Negative for: Fever; Chills Eyes Medical History: Positive for: Cataracts - removed "a few years ago" (B ); Glaucoma Ear/Nose/Mouth/Throat Medical History: Negative for: Chronic sinus problems/congestion; Middle ear problems Hematologic/Lymphatic Medical History: Negative for: Anemia; Hemophilia; Human Immunodeficiency Virus; Lymphedema; Sickle Cell Disease Respiratory Complaints and Symptoms: No Complaints or Symptoms Medical History: Negative for: Aspiration; Asthma; Chronic Obstructive Pulmonary Disease (COPD); Pneumothorax; Sleep Apnea; Tuberculosis Cardiovascular Complaints and Symptoms: No Complaints or Symptoms Medical History: Positive for: Hypertension Past Medical History NotesMarland Kitchen ELNA, RADOVICH ([id number]) TIA Gastrointestinal Medical History: Negative for: Cirrhosis ; Crohnos; Hepatitis A; Hepatitis B; Hepatitis C Endocrine Medical History: Positive for: Type II Diabetes Time with diabetes: 2 years Treated with: Oral agents, Diet Blood sugar tested every day: Yes Tested : twice Blood sugar testing results: Bedtime: 115 Genitourinary Medical History: Negative for: End Stage Renal Disease Immunological Medical History: Negative for: Lupus Erythematosus; Raynaudos; Scleroderma Integumentary (Skin) Medical History: Positive for: History of pressure wounds Negative  for: History of Burn Musculoskeletal Medical History: Negative for: Gout; Rheumatoid Arthritis; Osteoarthritis; Osteomyelitis Neurologic Medical History: Positive for: Neuropathy Past Medical History Notes: TIA Oncologic Medical History: Negative for: Received Chemotherapy; Received  Radiation Psychiatric Complaints and Symptoms: No Complaints or Symptoms HBO Extended History Items Eyes: Eyes: Cataracts Glaucoma Angela Patterson, Angela Patterson ([id number]) Immunizations Pneumococcal Vaccine: Received Pneumococcal Vaccination: Yes Implantable Devices Family and Social History Cancer: Yes - Father; Diabetes: Yes - Siblings; Heart Disease: No; Hereditary Spherocytosis: No; Hypertension: No; Kidney Disease: No; Lung Disease: No; Seizures: No; Stroke: Yes - Mother; Thyroid Problems: No; Tuberculosis: No; Never smoker; Marital Status - Widowed; Alcohol Use: Never; Drug Use: No History; Caffeine Use: Daily; Financial Concerns: No; Food, Clothing or Shelter Needs: No; Support System Lacking: No; Transportation Concerns: No; Advanced Directives: Yes (Not Provided); Patient does not want information on Advanced Directives; Do not resuscitate: No; Living Will: Yes (Copy provided); Medical Power of Attorney: Yes - Olen Cordial- daughter (Copy provided) Physician Affirmation I have reviewed and agree with the above information. Electronic Signature(s) Signed: 12/22/2017 8:38:06 AM By: Worthy Keeler PA-C Signed: 12/23/2017 2:43:37 PM By: Alric Quan Entered By: Worthy Keeler on 12/22/2017 02:10:26 Angela Patterson ([id number]) -------------------------------------------------------------------------------- SuperBill Details Patient Name: Angela Patterson. Date of Service: 12/21/2017 Medical Record Number: [id number] Patient Account Number: [id number] Date of Birth/Sex: Jul 18, 1921 (84 y.o. F) Treating RN: Ahmed Prima Primary Care Provider: Einar Pheasant Other Clinician: Referring Provider: Einar Pheasant Treating Provider/Extender: Melburn Hake, HOYT Weeks in Treatment: 14 Diagnosis Coding ICD-10 Codes Code Description E11.621 Type 2 diabetes mellitus with foot ulcer E11.40 Type 2 diabetes mellitus with diabetic neuropathy, unspecified L97.522 Non-pressure chronic ulcer of other part  of left foot with fat layer exposed S51.802A Unspecified open wound of left forearm, initial encounter L84 Corns and callosities I10 Essential (primary) hypertension Facility Procedures CPT4 Code: [id number] Description: 12248 - DEB SUBQ TISSUE 20 SQ CM/< ICD-10 Diagnosis Description L97.522 Non-pressure chronic ulcer of other part of left foot with fat Modifier: layer exposed Quantity: 1 Physician Procedures CPT4 Code: 2500370 Description: 48889 - WC PHYS SUBQ TISS 20 SQ CM ICD-10 Diagnosis Description L97.522 Non-pressure chronic ulcer of other part of left foot with fat Modifier: layer exposed Quantity: 1 Electronic Signature(s) Signed: 12/22/2017 8:38:06 AM By: Worthy Keeler PA-C Entered By: Worthy Keeler on 12/22/2017 02:12:24

## 2018-01-04 ENCOUNTER — Encounter: Payer: Medicare Other | Admitting: Physician Assistant

## 2018-01-04 DIAGNOSIS — E11621 Type 2 diabetes mellitus with foot ulcer: Secondary | ICD-10-CM | POA: Diagnosis not present

## 2018-01-04 DIAGNOSIS — L89894 Pressure ulcer of other site, stage 4: Secondary | ICD-10-CM | POA: Diagnosis not present

## 2018-01-04 DIAGNOSIS — L97522 Non-pressure chronic ulcer of other part of left foot with fat layer exposed: Secondary | ICD-10-CM | POA: Diagnosis not present

## 2018-01-04 DIAGNOSIS — L97509 Non-pressure chronic ulcer of other part of unspecified foot with unspecified severity: Secondary | ICD-10-CM | POA: Diagnosis not present

## 2018-01-04 DIAGNOSIS — E1140 Type 2 diabetes mellitus with diabetic neuropathy, unspecified: Secondary | ICD-10-CM | POA: Diagnosis not present

## 2018-01-04 DIAGNOSIS — I10 Essential (primary) hypertension: Secondary | ICD-10-CM | POA: Diagnosis not present

## 2018-01-04 DIAGNOSIS — Z8673 Personal history of transient ischemic attack (TIA), and cerebral infarction without residual deficits: Secondary | ICD-10-CM | POA: Diagnosis not present

## 2018-01-06 NOTE — Progress Notes (Signed)
Angela Patterson, Angela Patterson ([id number]) Visit Report for 01/04/2018 Chief Complaint Document Details Patient Name: Angela Patterson, Angela Patterson. Date of Service: 01/04/2018 10:00 AM Medical Record Number: [id number] Patient Account Number: [id number] Date of Birth/Sex: 1920-11-10 (84 y.o. F) Treating RN: Ahmed Prima Primary Care Provider: Einar Pheasant Other Clinician: Referring Provider: Einar Pheasant Treating Provider/Extender: Melburn Hake, HOYT Weeks in Treatment: 16 Information Obtained from: Patient Chief Complaint Left plantar foot ulcer Electronic Signature(s) Signed: 01/05/2018 7:52:50 AM By: Worthy Keeler PA-C Entered By: Worthy Keeler on 01/04/2018 10:04:05 Angela Patterson ([id number]) -------------------------------------------------------------------------------- Debridement Details Patient Name: Angela Patterson. Date of Service: 01/04/2018 10:00 AM Medical Record Number: [id number] Patient Account Number: [id number] Date of Birth/Sex: 1921-02-03 (84 y.o. F) Treating RN: Ahmed Prima Primary Care Provider: Einar Pheasant Other Clinician: Referring Provider: Einar Pheasant Treating Provider/Extender: Melburn Hake, HOYT Weeks in Treatment: 16 Debridement Performed for Wound #4 Left,Plantar Metatarsal head first Assessment: Performed By: Physician STONE III, HOYT E., PA-C Debridement Type: Debridement Pre-procedure Verification/Time Yes - 10:30 Out Taken: Start Time: 10:30 Pain Control: Lidocaine 4% Topical Solution Total Area Debrided (L x W): 0.1 (cm) x 0.3 (cm) = 0.03 (cm) Tissue and other material Viable, Callus, Slough, Subcutaneous, Fibrin/Exudate, Slough debrided: Level: Skin/Subcutaneous Tissue Debridement Description: Excisional Instrument: Curette Bleeding: Minimum Hemostasis Achieved: Pressure End Time: 10:37 Procedural Pain: 0 Post Procedural Pain: 0 Response to Treatment: Procedure was tolerated well Level of Consciousness: Awake and Alert Post Procedure  Vitals: Temperature: 97.8 Pulse: 72 Respiratory Rate: 16 Blood Pressure: Systolic Blood Pressure: 174 Diastolic Blood Pressure: 65 Post Debridement Measurements of Total Wound Length: (cm) 0.2 Stage: Category/Stage IV Width: (cm) 0.3 Depth: (cm) 0.3 Volume: (cm) 0.014 Character of Wound/Ulcer Post Requires Further Debridement Debridement: Post Procedure Diagnosis Same as Pre-procedure Electronic Signature(s) Signed: 01/04/2018 4:21:54 PM By: Alric Quan Signed: 01/05/2018 7:52:50 AM By: Worthy Keeler PA-C Entered By: Alric Quan on 01/04/2018 10:37:26 Angela Patterson ([name] ([id number]) -------------------------------------------------------------------------------- HPI Details Patient Name: Angela Patterson. Date of Service: 01/04/2018 10:00 AM Medical Record Number: [id number] Patient Account Number: [id number] Date of Birth/Sex: [birth year] (84 y.o. F) Treating RN: Ahmed Prima Primary Care Provider: Einar Pheasant Other Clinician: Referring Provider: Einar Pheasant Treating Provider/Extender: Melburn Hake, HOYT Weeks in Treatment: 16 History of Present Illness HPI Description: this very pleasant 84 year old patient who is extremely active and ambulating all day has had problems with the left foot for over a year. Her daughter who is her caregiver says she drives around and even goes around and does her chores by herself. this is the third recurrence and after the last time we saw in June and discharged her her daughter has got her to pace of diabetic shoes and she's been wearing these regularly. She has been on the shoes for about 6 months now. Her diabetes is under control and her blood sugars run from a anywhere between 110 to 130 and she checks them twice a day. no fever or discharge. During her initial visit, an x-ray of her left foot was done on 09/26/2014. There was no cortical erosion noted there was mild osteopenia in the distal  first metatarsal. Radiologist has recommended a MRI of the left foot to exclude osteomyelitis. MRI done on 10/09/2014 reveals that the area where she had a phlegmon is without osteomyelitis. the bone marrow and other areas are compatible with reactive edema rather than osteomyelitis. 08/06/2015 -- x-ray of the left foot done on 07/30/2015 shows IMPRESSION:Soft tissue wound with a bandage is  noted over the plantar aspect of the distal left foot. Diffuse osteopenia degenerative change. No acute bony abnormality 09/16/2015 -- last week she had gone to [facility] and met with Harrington Challenger who had spoken to me over the phone. Instead of a DH walking boot he has given her an offloading boot which will make her more steady and this boot is called DH healing sandal with a custom-made insole to offload this area. Readmission: 09/10/17 on evaluation today patient presents for reevaluation in regard to her left plantar foot ulcer has actually been since 2017 since we have last seen her. With that being said she has seem to do fairly well in the interim since that time. Patient is seen with both her daughter as well as her son present for today's evaluation. Her son tells me that they have ordered diabetic shoes for her which obviously are custom-made and that they were hoping these would be available today to bring in. With that being said she does also have some of the boots left that she had previously for offloading and they brought those with them today as well. With that being said up to this point patient has just been using her tennis shoes in regard to her footwear. She has had drainage from the ulcer and it really does not seem to be improving significantly at this time. She does have a history of diabetes mellitus type II which she is on medication and she is also on a blood thinner. Fortunately she does not seem to have any evidence of discomfort due to the diabetic  neuropathy. Patient does have a very high arch which is why she seems to get pressure to this area of the first metatarsal on the left foot leading to the ulcerations that develop. The current ulceration is in the exact location of her prior ulceration. She has not had a recent x-ray. 09/17/17 On evaluation today patient appears to be doing well in regard to her left plantar foot ulcer. Since last week she has had excellent filling in of the wound and this is definitely much smaller than previously noted. With that being said she is having no pain she has neuropathy and therefore does not feel any pain anyway. There's no evidence of infection which is great news. She does have callous but again this is not nearly as significant as previously noted. Overall I'm definitely pleased with how things stand today. 09/24/17 on evaluation today patient's left plantar foot ulcer appears to show signs of good improvement compared even to last week's evaluation. She is slowly healing and again from the first time I saw this this definitely appears to be doing much better. She's not have any discomfort which is great news unfortunately most of this is actually due to the fact that she has neuropathy. Other than that however she has been tolerating the dressing changes without complication she actually performs the dressing changes herself. Angela Patterson, Angela Patterson ([id number]) 10/08/17 on evaluation today patient's foot ulcer on the right actually appears to be doing a little bit worse unfortunately. She has been tolerating the dressing changes fairly well she performs these herself. Although I'm unsure that she has really been packing the alginate into the wound I have discussed this with her before but it sounds as if she may not be. Nonetheless I did have a discussion concerning this again today with the daughter present as well as her son and other daughter whom I just met  today. Patient did get her custom shoes which  finally arrived which is definitely good news. These do appear to be excellent I did check them out in the offloading as well as our support is great. 10/15/17 on evaluation today patient appears to be doing little better compared to last week's evaluation. She does have a little bit of bruising on the lateral border of the wound which seems to be indicative of some pressure occurring at that site. With that being said this is most likely as a result of the dressing slipping apparently with the new shoes this is not been staying in place as well. That's one thing we are going to have to continue to keep an eye on and see if there's any issues in that regard. Nonetheless we may need to go back to the offloading boots until we get this healed and then have her transfer back to her custom shoes depend on how things progress. Nonetheless at this time I do not see any definite or permanent damage and again she does seem to be doing a little bit better in my opinion. 10/22/17 on evaluation today patient actually appears to be doing rather well in regard to her left foot ulcer. The dressing has been staying on better which is good news. Subsequently the patient also has been utilizing her diabetic shoes with good result as well. Overall I think it is helpful for her now that the dressings are staying in place there appears to be no new injury and the wound of the ball is showing signs of improving. 11/09/17 on evaluation today patient appears to be doing better in regard to her left plantar foot ulcer. She has much less callous buildup this week compared to last time that I evaluated her two weeks prior. Overall I feel like this is definitely showing signs of improvement which is good news. There does not appear to be any new injury to the foot which is also excellent news. Overall I feel like that her shoes for offloading are doing in great job. Overall I'm pleased with the progress. She does have an area on  her left forearm which has previously been evaluated at the walk-in urgent care. She states that it seems to have done pretty well but her son would like for [place] to evaluate this further and ensure there is nothing that appears to be infected or otherwise causing her trouble. 11/23/17 on evaluation today patient's wound actually appears to be doing fairly well at this point. She has been tolerating the dressing changes without complication. She does seem to be making some progress in regard to the overall quality and size of the wound in my opinion although she still develop some callous there is no evidence of deep tissue injury and most of the dark discoloration seems to be working its way out at this point. There's no evidence of infection. 12/07/17 on evaluation today patient appears to be doing great in regard to her left foot ulcer. Unfortunately the only thing I see that's an issue at this point is she's not really noting a lot of granulation and filling in at this time which I think we need to work on. This may require dressing change I don't think she needs the alginate anymore she's not having as much drainage as she had in the past. Therefore we're gonna see about doing something different as of today. 12/21/17 on evaluation today patient's foot ulcer actually appears to be doing rather well  in regard to the right plantar foot. She has been wearing her offloading shoes which I think do seem to be helping. Subsequently she also has been doing excellent when it comes to keeping the dressing clean and dry as well is changing it on a regular basis which again she does on her own accord. Overall I'm extremely happy with what I'm seeing at this time. 01/04/18 on evaluation today patient's wound actually appears to show signs of good improvement compared to her last evaluation. Overall I'm very pleased with the progress she is making each time I see her. She does have callous and on the plantar aspect  of her foot although there is no deep tissue injury or signs of other injury noted at this point. Overall I'm very pleased in this regard. Electronic Signature(s) Signed: 01/05/2018 7:52:50 AM By: Worthy Keeler PA-C Entered By: Worthy Keeler on 01/04/2018 11:17:41 Angela Patterson ([id number]) -------------------------------------------------------------------------------- Physical Exam Details Patient Name: Angela Patterson. Date of Service: 01/04/2018 10:00 AM Medical Record Number: [id number] Patient Account Number: [id number] Date of Birth/Sex: Jun 03, 1921 (84 y.o. F) Treating RN: Ahmed Prima Primary Care Provider: Einar Pheasant Other Clinician: Referring Provider: Einar Pheasant Treating Provider/Extender: STONE III, HOYT Weeks in Treatment: 45 Constitutional Well-nourished and well-hydrated in no acute distress. Respiratory normal breathing without difficulty. Psychiatric this patient is able to make decisions and demonstrates good insight into disease process. Alert and Oriented x 3. pleasant and cooperative. Notes Currently I am happy with the appearance of the wound bed she did have some Slough which was sharply debrided away today. With that being said this was minimal and overall I think she's making great progress. I did recommend currently that I do believe she would benefit from peering away the callous that is currently overlying the wound bed and surrounding area. She tolerated this without complication as well. Electronic Signature(s) Signed: 01/05/2018 7:52:50 AM By: Worthy Keeler PA-C Entered By: Worthy Keeler on 01/04/2018 11:18:19 Angela Patterson ([id number]) -------------------------------------------------------------------------------- Physician Orders Details Patient Name: Angela Patterson. Date of Service: 01/04/2018 10:00 AM Medical Record Number: [id number] Patient Account Number: [id number] Date of Birth/Sex: 1921-04-08 (84 y.o. F) Treating RN:  Ahmed Prima Primary Care Provider: Einar Pheasant Other Clinician: Referring Provider: Einar Pheasant Treating Provider/Extender: Melburn Hake, HOYT Weeks in Treatment: 16 Verbal / Phone Orders: Yes Clinician: Carolyne Fiscal, Debi Read Back and Verified: Yes Diagnosis Coding ICD-10 Coding Code Description E11.621 Type 2 diabetes mellitus with foot ulcer E11.40 Type 2 diabetes mellitus with diabetic neuropathy, unspecified L97.522 Non-pressure chronic ulcer of other part of left foot with fat layer exposed S51.802A Unspecified open wound of left forearm, initial encounter L84 Corns and callosities I10 Essential (primary) hypertension Wound Cleansing Wound #4 Left,Plantar Metatarsal head first o Clean wound with Normal Saline. o Cleanse wound with mild soap and water o May Shower, gently pat wound dry prior to applying new dressing. Anesthetic (add to Medication List) Wound #4 Left,Plantar Metatarsal head first o Topical Lidocaine 4% cream applied to wound bed prior to debridement (In Clinic Only). Skin Barriers/Peri-Wound Care Wound #4 Left,Plantar Metatarsal head first o Skin Prep Primary Wound Dressing Wound #4 Left,Plantar Metatarsal head first o Collagen - saline to moisten Secondary Dressing Wound #4 Left,Plantar Metatarsal head first o Boardered Foam Dressing - or telfa island with foam for padding o Foam o Other - or Alldress Dressing Change Frequency Wound #4 Left,Plantar Metatarsal head first o Change dressing every other day. Follow-up Appointments Wound #4 Left,Plantar  Metatarsal head first o Return Appointment in 2 weeks. Angela Patterson, Angela Patterson ([id number]) Additional Orders / Instructions Wound #4 Left,Plantar Metatarsal head first o Increase protein intake. o Activity as tolerated Patient Medications Allergies: Iodinated Contrast Media - IV Dye Notifications Medication Indication Start End lidocaine DOSE 1 - topical 4 % cream - 1 cream  topical Electronic Signature(s) Signed: 01/04/2018 4:21:54 PM By: Alric Quan Signed: 01/05/2018 7:52:50 AM By: Worthy Keeler PA-C Entered By: Alric Quan on 01/04/2018 10:38:16 Angela Patterson ([id number]) -------------------------------------------------------------------------------- Prescription 01/04/2018 Patient Name: Angela Patterson. Provider: Worthy Keeler PA-C Date of Birth: 10-Oct-1920 NPI#: [id number] Sex: F DEA#: [id number] Phone #: [phone] License #: Patient Address: [facility] [place], [place] [address] [address] [place], [place] [address] [phone] Allergies Iodinated Contrast Media - IV Dye Medication Medication: Route: Strength: Form: lidocaine topical 4% cream Class: TOPICAL LOCAL ANESTHETICS Dose: Frequency / Time: Indication: 1 1 cream topical Number of Refills: Number of Units: 0 Generic Substitution: Start Date: End Date: Administered at [facility]: Yes Time Administered: Time Discontinued: Note to Pharmacy: Signature(s): Date(s): Electronic Signature(s) Signed: 01/04/2018 4:21:54 PM By: Alric Quan Signed: 01/05/2018 7:52:50 AM By: Worthy Keeler PA-C Entered By: Alric Quan on 01/04/2018 10:38:17 Angela Patterson ([name] ([id number]) --------------------------------------------------------------------------------  Problem List Details Patient Name: Angela Patterson. Date of Service: 01/04/2018 10:00 AM Medical Record Number: [id number] Patient Account Number: [id number] Date of Birth/Sex: 10/15/1920 (84 y.o. F) Treating RN: Ahmed Prima Primary Care Provider: Einar Pheasant Other Clinician: Referring Provider: Einar Pheasant Treating Provider/Extender: Melburn Hake, HOYT Weeks in Treatment: 16 Active Problems ICD-10 Impacting Encounter Code Description Active Date Wound  Healing Diagnosis E11.621 Type 2 diabetes mellitus with foot ulcer 09/10/2017 No Yes E11.40 Type 2 diabetes mellitus with diabetic neuropathy, 09/10/2017 No Yes unspecified L97.522 Non-pressure chronic ulcer of other part of left foot with fat 09/10/2017 No Yes layer exposed S51.802A Unspecified open wound of left forearm, initial encounter 11/09/2017 No Yes L84 Corns and callosities 09/10/2017 No Yes I10 Essential (primary) hypertension 09/10/2017 No Yes Inactive Problems Resolved Problems Electronic Signature(s) Signed: 01/05/2018 7:52:50 AM By: Worthy Keeler PA-C Entered By: Worthy Keeler on 01/04/2018 10:03:54 Angela Patterson ([id number]) -------------------------------------------------------------------------------- Progress Note Details Patient Name: Angela Patterson. Date of Service: 01/04/2018 10:00 AM Medical Record Number: [id number] Patient Account Number: [id number] Date of Birth/Sex: 1920/11/14 (84 y.o. F) Treating RN: Ahmed Prima Primary Care Provider: Einar Pheasant Other Clinician: Referring Provider: Einar Pheasant Treating Provider/Extender: Melburn Hake, HOYT Weeks in Treatment: 16 Subjective Chief Complaint Information obtained from Patient Left plantar foot ulcer History of Present Illness (HPI) this very pleasant 84 year old patient who is extremely active and ambulating all day has had problems with the left foot for over a year. Her daughter who is her caregiver says she drives around and even goes around and does her chores by herself. this is the third recurrence and after the last time we saw in June and discharged her her daughter has got her to pace of diabetic shoes and she's been wearing these regularly. She has been on the shoes for about 6 months now. Her diabetes is under control and her blood sugars run from a anywhere between 110 to 130 and she checks them twice a day. no fever or discharge. During her initial visit, an x-ray of her left foot was  done on 09/26/2014. There was no cortical erosion  noted there was mild osteopenia in the distal first metatarsal. Radiologist has recommended a MRI of the left foot to exclude osteomyelitis. MRI done on 10/09/2014 reveals that the area where she had a phlegmon is without osteomyelitis. the bone marrow and other areas are compatible with reactive edema rather than osteomyelitis. 08/06/2015 -- x-ray of the left foot done on 07/30/2015 shows IMPRESSION:Soft tissue wound with a bandage is noted over the plantar aspect of the distal left foot. Diffuse osteopenia degenerative change. No acute bony abnormality 09/16/2015 -- last week she had gone to [facility] and met with Harrington Challenger who had spoken to me over the phone. Instead of a DH walking boot he has given her an offloading boot which will make her more steady and this boot is called DH healing sandal with a custom-made insole to offload this area. Readmission: 09/10/17 on evaluation today patient presents for reevaluation in regard to her left plantar foot ulcer has actually been since 2017 since we have last seen her. With that being said she has seem to do fairly well in the interim since that time. Patient is seen with both her daughter as well as her son present for today's evaluation. Her son tells me that they have ordered diabetic shoes for her which obviously are custom-made and that they were hoping these would be available today to bring in. With that being said she does also have some of the boots left that she had previously for offloading and they brought those with them today as well. With that being said up to this point patient has just been using her tennis shoes in regard to her footwear. She has had drainage from the ulcer and it really does not seem to be improving significantly at this time. She does have a history of diabetes mellitus type II which she is on medication and she is also on a blood  thinner. Fortunately she does not seem to have any evidence of discomfort due to the diabetic neuropathy. Patient does have a very high arch which is why she seems to get pressure to this area of the first metatarsal on the left foot leading to the ulcerations that develop. The current ulceration is in the exact location of her prior ulceration. She has not had a recent x-ray. 09/17/17 On evaluation today patient appears to be doing well in regard to her left plantar foot ulcer. Since last week she has had excellent filling in of the wound and this is definitely much smaller than previously noted. With that being said she is having no pain she has neuropathy and therefore does not feel any pain anyway. There's no evidence of infection which is great news. She does have callous but again this is not nearly as significant as previously noted. Overall I'm definitely pleased with how things stand today. Angela Patterson, Angela Patterson ([id number]) 09/24/17 on evaluation today patient's left plantar foot ulcer appears to show signs of good improvement compared even to last week's evaluation. She is slowly healing and again from the first time I saw this this definitely appears to be doing much better. She's not have any discomfort which is great news unfortunately most of this is actually due to the fact that she has neuropathy. Other than that however she has been tolerating the dressing changes without complication she actually performs the dressing changes herself. 10/08/17 on evaluation today patient's foot ulcer on the right actually appears to be doing a little bit worse  unfortunately. She has been tolerating the dressing changes fairly well she performs these herself. Although I'm unsure that she has really been packing the alginate into the wound I have discussed this with her before but it sounds as if she may not be. Nonetheless I did have a discussion concerning this again today with the daughter present as well  as her son and other daughter whom I just met today. Patient did get her custom shoes which finally arrived which is definitely good news. These do appear to be excellent I did check them out in the offloading as well as our support is great. 10/15/17 on evaluation today patient appears to be doing little better compared to last week's evaluation. She does have a little bit of bruising on the lateral border of the wound which seems to be indicative of some pressure occurring at that site. With that being said this is most likely as a result of the dressing slipping apparently with the new shoes this is not been staying in place as well. That's one thing we are going to have to continue to keep an eye on and see if there's any issues in that regard. Nonetheless we may need to go back to the offloading boots until we get this healed and then have her transfer back to her custom shoes depend on how things progress. Nonetheless at this time I do not see any definite or permanent damage and again she does seem to be doing a little bit better in my opinion. 10/22/17 on evaluation today patient actually appears to be doing rather well in regard to her left foot ulcer. The dressing has been staying on better which is good news. Subsequently the patient also has been utilizing her diabetic shoes with good result as well. Overall I think it is helpful for her now that the dressings are staying in place there appears to be no new injury and the wound of the ball is showing signs of improving. 11/09/17 on evaluation today patient appears to be doing better in regard to her left plantar foot ulcer. She has much less callous buildup this week compared to last time that I evaluated her two weeks prior. Overall I feel like this is definitely showing signs of improvement which is good news. There does not appear to be any new injury to the foot which is also excellent news. Overall I feel like that her shoes for  offloading are doing in great job. Overall I'm pleased with the progress. She does have an area on her left forearm which has previously been evaluated at the walk-in urgent care. She states that it seems to have done pretty well but her son would like for [place] to evaluate this further and ensure there is nothing that appears to be infected or otherwise causing her trouble. 11/23/17 on evaluation today patient's wound actually appears to be doing fairly well at this point. She has been tolerating the dressing changes without complication. She does seem to be making some progress in regard to the overall quality and size of the wound in my opinion although she still develop some callous there is no evidence of deep tissue injury and most of the dark discoloration seems to be working its way out at this point. There's no evidence of infection. 12/07/17 on evaluation today patient appears to be doing great in regard to her left foot ulcer. Unfortunately the only thing I see that's an issue at this point is she's not  really noting a lot of granulation and filling in at this time which I think we need to work on. This may require dressing change I don't think she needs the alginate anymore she's not having as much drainage as she had in the past. Therefore we're gonna see about doing something different as of today. 12/21/17 on evaluation today patient's foot ulcer actually appears to be doing rather well in regard to the right plantar foot. She has been wearing her offloading shoes which I think do seem to be helping. Subsequently she also has been doing excellent when it comes to keeping the dressing clean and dry as well is changing it on a regular basis which again she does on her own accord. Overall I'm extremely happy with what I'm seeing at this time. 01/04/18 on evaluation today patient's wound actually appears to show signs of good improvement compared to her last evaluation. Overall I'm very pleased  with the progress she is making each time I see her. She does have callous and on the plantar aspect of her foot although there is no deep tissue injury or signs of other injury noted at this point. Overall I'm very pleased in this regard. Patient History Information obtained from Patient. Family History INDYA, OLIVERIA ([id number]) Cancer - Father, Diabetes - Siblings, Stroke - Mother, No family history of Heart Disease, Hereditary Spherocytosis, Hypertension, Kidney Disease, Lung Disease, Seizures, Thyroid Problems, Tuberculosis. Social History Never smoker, Marital Status - Widowed, Alcohol Use - Never, Drug Use - No History, Caffeine Use - Daily. Medical And Surgical History Notes Cardiovascular TIA Neurologic TIA Review of Systems (ROS) Constitutional Symptoms (General Health) Denies complaints or symptoms of Fever, Chills. Respiratory The patient has no complaints or symptoms. Cardiovascular The patient has no complaints or symptoms. Psychiatric The patient has no complaints or symptoms. Objective Constitutional Well-nourished and well-hydrated in no acute distress. Vitals Time Taken: 10:04 AM, Height: 65 in, Weight: 107 lbs, BMI: 17.8, Temperature: 97.8 F, Pulse: 72 bpm, Respiratory Rate: 16 breaths/min, Blood Pressure: 143/65 mmHg. Respiratory normal breathing without difficulty. Psychiatric this patient is able to make decisions and demonstrates good insight into disease process. Alert and Oriented x 3. pleasant and cooperative. General Notes: Currently I am happy with the appearance of the wound bed she did have some Slough which was sharply debrided away today. With that being said this was minimal and overall I think she's making great progress. I did recommend currently that I do believe she would benefit from peering away the callous that is currently overlying the wound bed and surrounding area. She tolerated this without complication as well. Integumentary  (Hair, Skin) Wound #4 status is Open. Original cause of wound was Pressure Injury. The wound is located on the Left,Plantar Metatarsal head first. The wound measures 0.1cm length x 0.3cm width x 0.3cm depth; 0.024cm^2 area and 0.007cm^3 volume. There is Fat Layer (Subcutaneous Tissue) Exposed exposed. There is no tunneling or undermining noted. There is a large amount of serous drainage noted. The wound margin is indistinct and nonvisible. There is large (67-100%) red granulation within the wound bed. There is a small (1-33%) amount of necrotic tissue within the wound bed including Adherent Slough. The periwound skin appearance exhibited: Callus. The periwound skin appearance did not exhibit: Crepitus, Excoriation, Angela Patterson, Angela Patterson. ([id number]) Induration, Rash, Scarring, Dry/Scaly, Maceration, Atrophie Blanche, Cyanosis, Ecchymosis, Hemosiderin Staining, Mottled, Pallor, Rubor, Erythema. Periwound temperature was noted as No Abnormality. The periwound has tenderness on palpation. Assessment Active Problems ICD-10  Type 2 diabetes mellitus with foot ulcer Type 2 diabetes mellitus with diabetic neuropathy, unspecified Non-pressure chronic ulcer of other part of left foot with fat layer exposed Unspecified open wound of left forearm, initial encounter Corns and callosities Essential (primary) hypertension Procedures Wound #4 Pre-procedure diagnosis of Wound #4 is a Pressure Ulcer located on the Left,Plantar Metatarsal head first . There was a Excisional Skin/Subcutaneous Tissue Debridement with a total area of 0.03 sq cm performed by STONE III, HOYT E., PA-C. With the following instrument(s): Curette to remove Viable tissue/material. Material removed includes Callus, Subcutaneous Tissue, Slough, and Fibrin/Exudate after achieving pain control using Lidocaine 4% Topical Solution. No specimens were taken. A time out was conducted at 10:30, prior to the start of the procedure. A Minimum amount of  bleeding was controlled with Pressure. The procedure was tolerated well with a pain level of 0 throughout and a pain level of 0 following the procedure. Patient s Level of Consciousness post procedure was recorded as Awake and Alert. Post Debridement Measurements: 0.2cm length x 0.3cm width x 0.3cm depth; 0.014cm^3 volume. Post debridement Stage noted as Category/Stage IV. Character of Wound/Ulcer Post Debridement requires further debridement. Post procedure Diagnosis Wound #4: Same as Pre-Procedure Plan Wound Cleansing: Wound #4 Left,Plantar Metatarsal head first: Clean wound with Normal Saline. Cleanse wound with mild soap and water May Shower, gently pat wound dry prior to applying new dressing. Anesthetic (add to Medication List): Wound #4 Left,Plantar Metatarsal head first: Topical Lidocaine 4% cream applied to wound bed prior to debridement (In Clinic Only). Skin Barriers/Peri-Wound Care: Wound #4 Left,Plantar Metatarsal head first: Skin Prep Primary Wound Dressing: Wound #4 Left,Plantar Metatarsal head first: Angela Patterson, Angela Patterson ([id number]) Collagen - saline to moisten Secondary Dressing: Wound #4 Left,Plantar Metatarsal head first: Boardered Foam Dressing - or telfa island with foam for padding Foam Other - or Alldress Dressing Change Frequency: Wound #4 Left,Plantar Metatarsal head first: Change dressing every other day. Follow-up Appointments: Wound #4 Left,Plantar Metatarsal head first: Return Appointment in 2 weeks. Additional Orders / Instructions: Wound #4 Left,Plantar Metatarsal head first: Increase protein intake. Activity as tolerated The following medication(s) was prescribed: lidocaine topical 4 % cream 1 1 cream topical was prescribed at facility I'm in a recommend that we continue with the Current wound care measures for the next two weeks time and we will see were things stand following. If anything changes in the meantime she will contact the office and let  us know. Please see above for specific wound care orders. We will see patient for re-evaluation in 2 week(s) here in the clinic. If anything worsens or changes patient will contact our office for additional recommendations. Electronic Signature(s) Signed: 01/05/2018 7:52:50 AM By: Worthy Keeler PA-C Entered By: Worthy Keeler on 01/04/2018 11:18:56 Angela Patterson ([id number]) -------------------------------------------------------------------------------- ROS/PFSH Details Patient Name: Angela Patterson. Date of Service: 01/04/2018 10:00 AM Medical Record Number: [id number] Patient Account Number: [id number] Date of Birth/Sex: 24-Nov-1920 (84 y.o. F) Treating RN: Ahmed Prima Primary Care Provider: Einar Pheasant Other Clinician: Referring Provider: Einar Pheasant Treating Provider/Extender: Melburn Hake, HOYT Weeks in Treatment: 16 Information Obtained From Patient Wound History Do you currently have one or more open woundso Yes How many open wounds do you currently haveo 1 Approximately how long have you had your woundso one month open How have you been treating your wound(s) until nowo wound cleanser Has your wound(s) ever healed and then re-openedo Yes Have you had any lab work done in the past montho  No Have you tested positive for an antibiotic resistant organism (MRSA, VRE)o No Have you tested positive for osteomyelitis (bone infection)o No Have you had any tests for circulation on your legso No Constitutional Symptoms (General Health) Complaints and Symptoms: Negative for: Fever; Chills Eyes Medical History: Positive for: Cataracts - removed "a few years ago" (B ); Glaucoma Ear/Nose/Mouth/Throat Medical History: Negative for: Chronic sinus problems/congestion; Middle ear problems Hematologic/Lymphatic Medical History: Negative for: Anemia; Hemophilia; Human Immunodeficiency Virus; Lymphedema; Sickle Cell Disease Respiratory Complaints and Symptoms: No Complaints or  Symptoms Medical History: Negative for: Aspiration; Asthma; Chronic Obstructive Pulmonary Disease (COPD); Pneumothorax; Sleep Apnea; Tuberculosis Cardiovascular Complaints and Symptoms: No Complaints or Symptoms Medical History: Positive for: Hypertension Past Medical History NotesMarland Kitchen Angela Patterson, Angela Patterson ([id number]) TIA Gastrointestinal Medical History: Negative for: Cirrhosis ; Crohnos; Hepatitis A; Hepatitis B; Hepatitis C Endocrine Medical History: Positive for: Type II Diabetes Time with diabetes: 2 years Treated with: Oral agents, Diet Blood sugar tested every day: Yes Tested : twice Blood sugar testing results: Bedtime: 115 Genitourinary Medical History: Negative for: End Stage Renal Disease Immunological Medical History: Negative for: Lupus Erythematosus; Raynaudos; Scleroderma Integumentary (Skin) Medical History: Positive for: History of pressure wounds Negative for: History of Burn Musculoskeletal Medical History: Negative for: Gout; Rheumatoid Arthritis; Osteoarthritis; Osteomyelitis Neurologic Medical History: Positive for: Neuropathy Past Medical History Notes: TIA Oncologic Medical History: Negative for: Received Chemotherapy; Received Radiation Psychiatric Complaints and Symptoms: No Complaints or Symptoms HBO Extended History Items Eyes: Eyes: Cataracts Glaucoma Angela Patterson, Angela Patterson ([id number]) Immunizations Pneumococcal Vaccine: Received Pneumococcal Vaccination: Yes Implantable Devices Family and Social History Cancer: Yes - Father; Diabetes: Yes - Siblings; Heart Disease: No; Hereditary Spherocytosis: No; Hypertension: No; Kidney Disease: No; Lung Disease: No; Seizures: No; Stroke: Yes - Mother; Thyroid Problems: No; Tuberculosis: No; Never smoker; Marital Status - Widowed; Alcohol Use: Never; Drug Use: No History; Caffeine Use: Daily; Financial Concerns: No; Food, Clothing or Shelter Needs: No; Support System Lacking: No; Transportation Concerns:  No; Advanced Directives: Yes (Not Provided); Patient does not want information on Advanced Directives; Do not resuscitate: No; Living Will: Yes (Copy provided); Medical Power of Attorney: Yes - Olen Cordial- daughter (Copy provided) Physician Affirmation I have reviewed and agree with the above information. Electronic Signature(s) Signed: 01/04/2018 4:21:54 PM By: Alric Quan Signed: 01/05/2018 7:52:50 AM By: Worthy Keeler PA-C Entered By: Worthy Keeler on 01/04/2018 11:18:01 Angela Patterson ([id number]) -------------------------------------------------------------------------------- SuperBill Details Patient Name: Angela Patterson. Date of Service: 01/04/2018 Medical Record Number: [id number] Patient Account Number: [id number] Date of Birth/Sex: 11-24-1920 (84 y.o. F) Treating RN: Ahmed Prima Primary Care Provider: Einar Pheasant Other Clinician: Referring Provider: Einar Pheasant Treating Provider/Extender: Melburn Hake, HOYT Weeks in Treatment: 16 Diagnosis Coding ICD-10 Codes Code Description E11.621 Type 2 diabetes mellitus with foot ulcer E11.40 Type 2 diabetes mellitus with diabetic neuropathy, unspecified L97.522 Non-pressure chronic ulcer of other part of left foot with fat layer exposed S51.802A Unspecified open wound of left forearm, initial encounter L84 Corns and callosities I10 Essential (primary) hypertension Facility Procedures CPT4 Code: 59163846 Description: 65993 - DEB SUBQ TISSUE 20 SQ CM/< ICD-10 Diagnosis Description L97.522 Non-pressure chronic ulcer of other part of left foot with fat Modifier: layer exposed Quantity: 1 Physician Procedures CPT4 Code: [id number] Description: 11042 - WC PHYS SUBQ TISS 20 SQ CM ICD-10 Diagnosis Description L97.522 Non-pressure chronic ulcer of other part of left foot with fat Modifier: layer exposed Quantity: 1 Electronic Signature(s) Signed: 01/05/2018 7:52:50 AM By: Worthy Keeler PA-C Entered By:  Worthy Keeler on  01/04/2018 11:19:12

## 2018-01-18 ENCOUNTER — Ambulatory Visit: Payer: Medicare Other | Admitting: Physician Assistant

## 2018-01-18 ENCOUNTER — Ambulatory Visit: Payer: Medicare Other | Admitting: Podiatry

## 2018-01-18 ENCOUNTER — Encounter: Payer: Self-pay | Admitting: Podiatry

## 2018-01-18 DIAGNOSIS — I70245 Atherosclerosis of native arteries of left leg with ulceration of other part of foot: Secondary | ICD-10-CM

## 2018-01-18 DIAGNOSIS — L97522 Non-pressure chronic ulcer of other part of left foot with fat layer exposed: Secondary | ICD-10-CM

## 2018-01-18 DIAGNOSIS — E0843 Diabetes mellitus due to underlying condition with diabetic autonomic (poly)neuropathy: Secondary | ICD-10-CM

## 2018-01-18 DIAGNOSIS — B351 Tinea unguium: Secondary | ICD-10-CM | POA: Diagnosis not present

## 2018-01-18 DIAGNOSIS — M79676 Pain in unspecified toe(s): Secondary | ICD-10-CM | POA: Diagnosis not present

## 2018-01-24 NOTE — Progress Notes (Signed)
   SUBJECTIVE Patient with a history of diabetes mellitus presents to office today complaining of elongated, thickened nails that cause pain while ambulating in shoes. She is unable to trim her own nails. She is also here for follow up evaluation of an ulceration of the left sub-first MPJ. She was being treated by Dr. Cannon Kettle but she is now caring for the wound herself. She states the wound has improved over the last few months. There are no modifying factors noted. She has been applying Prisma daily with a dressing. Patient is here for further evaluation and treatment.   Past Medical History:  Diagnosis Date  . Allergy   . Chicken pox   . CVA (cerebral vascular accident) ([place])   . Diabetes mellitus ([place])   . Hypercholesterolemia   . Hypertension     OBJECTIVE General Patient is awake, alert, and oriented x 3 and in no acute distress.  Derm Skin is dry and supple bilateral. Negative open lesions or macerations. Remaining integument unremarkable. Nails are tender, long, thickened and dystrophic with subungual debris, consistent with onychomycosis, 1-5 bilateral. No signs of infection noted.  Wound #1 noted to the left sub-first MPJ measuring 2.0 x 2.0 x 0.2 cm.  To the above-noted ulceration, there is no eschar. There is a moderate amount of slough, fibrin and necrotic tissue. Granulation tissue and wound base is red. There is no malodor. There is a minimal amount of serosanginous drainage noted. Periwound integrity is intact.   Vasc  DP and PT pedal pulses palpable bilaterally. Temperature gradient within normal limits.   Neuro Epicritic and protective threshold sensation diminished bilaterally.   Musculoskeletal Exam No symptomatic pedal deformities noted bilateral. Muscular strength within normal limits.  ASSESSMENT 1. Diabetes Mellitus w/ peripheral neuropathy 2. Onychomycosis of nail due to dermatophyte bilateral 3. Ulceration of the sub-first MPJ of the left foot secondary to  diabetes mellitus   PLAN OF CARE 1. Patient evaluated today. 2. Instructed to maintain good pedal hygiene and foot care. Stressed importance of controlling blood sugar.  3. Mechanical debridement of nails 1-5 bilaterally performed using a nail nipper. Filed with dremel without incident.  4. Medically necessary excisional debridement including subcutaneous tissue was performed using a tissue nipper and a chisel blade. Excisional debridement of all the necrotic nonviable tissue down to healthy bleeding viable tissue was performed with post-debridement measurements same as pre-. 5. The wound was cleansed and dry sterile dressing applied. 6. Continue using Prisma daily with a dry sterile dressing.  7. Continue wearing DM shoes.  8. Return to clinic in 2 weeks.      [name], DPM [facility]  Dr. [name], [address]                                        [place], [place] [address]                Office [phone]  Fax [phone]

## 2018-01-25 ENCOUNTER — Telehealth: Payer: Self-pay | Admitting: Internal Medicine

## 2018-01-25 NOTE — Telephone Encounter (Signed)
pts daughter dropped off homebound status paper work to be completed by Dr. Nicki Reaper. Please call her daughter, Louanna Raw at [phone] when papers are ready for pick up.

## 2018-01-27 NOTE — Telephone Encounter (Signed)
Paperwork handed to Dr. Nicki Reaper

## 2018-01-27 NOTE — Telephone Encounter (Signed)
Will need appt to go over paper work.  Please schedule and let me know when scheduled.  Will hold paperwork until appt.

## 2018-01-28 ENCOUNTER — Ambulatory Visit: Payer: Medicare Other | Admitting: Podiatry

## 2018-01-28 DIAGNOSIS — E0843 Diabetes mellitus due to underlying condition with diabetic autonomic (poly)neuropathy: Secondary | ICD-10-CM

## 2018-01-28 DIAGNOSIS — L97522 Non-pressure chronic ulcer of other part of left foot with fat layer exposed: Secondary | ICD-10-CM

## 2018-01-28 DIAGNOSIS — I70245 Atherosclerosis of native arteries of left leg with ulceration of other part of foot: Secondary | ICD-10-CM

## 2018-01-28 MED ORDER — GENTAMICIN SULFATE 0.1 % EX CREA: 1.0000 "application " | TOPICAL_CREAM | Freq: Three times a day (TID) | CUTANEOUS | 1 refills | Status: DC

## 2018-01-28 NOTE — Telephone Encounter (Signed)
Patient scheduled for 02/22/18 at 12

## 2018-01-31 NOTE — Progress Notes (Signed)
   Subjective:  84 year old female with PMHx of DM presenting today for follow up evaluation of an ulceration of the sub-first MPJ of the left foot. She states the wound looks better in her opinion when she changed the dressing last night. She states the area is no longer bleeding. There are no modifying factors noted. She has been using Prisma collagen as directed. Patient is here for further evaluation and treatment.    Past Medical History:  Diagnosis Date  . Allergy   . Chicken pox   . CVA (cerebral vascular accident) ([place])   . Diabetes mellitus ([place])   . Hypercholesterolemia   . Hypertension       Objective/Physical Exam General: The patient is alert and oriented x3 in no acute distress.  Dermatology:  Wound #1 noted to the sub-first MPJ of the left foot measuring 0.3 x 0.3 x 0.2 cm (LxWxD).   To the noted ulceration(s), there is no eschar. There is a moderate amount of slough, fibrin, and necrotic tissue noted. Granulation tissue and wound base is red. There is a minimal amount of serosanguineous drainage noted. There is no exposed bone muscle-tendon ligament or joint. There is no malodor. Periwound integrity is intact. Skin is warm, dry and supple bilateral lower extremities.  Vascular: Palpable pedal pulses bilaterally. No edema or erythema noted. Capillary refill within normal limits.  Neurological: Epicritic and protective threshold diminished bilaterally.   Musculoskeletal Exam: Range of motion within normal limits to all pedal and ankle joints bilateral. Muscle strength 5/5 in all groups bilateral.   Assessment: #1 ulceration of the sub-first MPJ of the left foot secondary to diabetes mellitus #2 diabetes mellitus w/ peripheral neuropathy   Plan of Care:  #1 Patient was evaluated. #2 medically necessary excisional debridement including subcutaneous tissue was performed using a tissue nipper and a chisel blade. Excisional debridement of all the necrotic nonviable  tissue down to healthy bleeding viable tissue was performed with post-debridement measurements same as pre-. #3 the wound was cleansed and dry sterile dressing applied. #4 Discontinue using Prisma collagen.  #5 Prescription for gentamicin cream provided to patient to be used daily with a bandage.  #6 Continue wearing DM shoes.  #7 patient is to return to clinic in 3 weeks.   [name], DPM [facility]  Dr. [name], [address]                                        [place], [place] [address]                Office [phone]  Fax [phone]

## 2018-02-17 ENCOUNTER — Other Ambulatory Visit: Payer: Self-pay | Admitting: Internal Medicine

## 2018-02-22 ENCOUNTER — Encounter: Payer: Self-pay | Admitting: Podiatry

## 2018-02-22 ENCOUNTER — Ambulatory Visit (INDEPENDENT_AMBULATORY_CARE_PROVIDER_SITE_OTHER): Payer: Medicare Other | Admitting: Internal Medicine

## 2018-02-22 ENCOUNTER — Ambulatory Visit: Payer: Medicare Other | Admitting: Podiatry

## 2018-02-22 ENCOUNTER — Encounter: Payer: Self-pay | Admitting: Internal Medicine

## 2018-02-22 VITALS — BP 128/64 | HR 83 | Temp 98.2°F | Resp 18 | Ht 64.0 in | Wt 106.0 lb

## 2018-02-22 DIAGNOSIS — I10 Essential (primary) hypertension: Secondary | ICD-10-CM

## 2018-02-22 DIAGNOSIS — E7800 Pure hypercholesterolemia, unspecified: Secondary | ICD-10-CM

## 2018-02-22 DIAGNOSIS — I70245 Atherosclerosis of native arteries of left leg with ulceration of other part of foot: Secondary | ICD-10-CM

## 2018-02-22 DIAGNOSIS — E1140 Type 2 diabetes mellitus with diabetic neuropathy, unspecified: Secondary | ICD-10-CM | POA: Diagnosis not present

## 2018-02-22 DIAGNOSIS — R197 Diarrhea, unspecified: Secondary | ICD-10-CM | POA: Diagnosis not present

## 2018-02-22 DIAGNOSIS — L97522 Non-pressure chronic ulcer of other part of left foot with fat layer exposed: Secondary | ICD-10-CM | POA: Diagnosis not present

## 2018-02-22 DIAGNOSIS — L97529 Non-pressure chronic ulcer of other part of left foot with unspecified severity: Secondary | ICD-10-CM | POA: Diagnosis not present

## 2018-02-22 DIAGNOSIS — Z8673 Personal history of transient ischemic attack (TIA), and cerebral infarction without residual deficits: Secondary | ICD-10-CM

## 2018-02-22 DIAGNOSIS — Z0289 Encounter for other administrative examinations: Secondary | ICD-10-CM

## 2018-02-22 DIAGNOSIS — E0843 Diabetes mellitus due to underlying condition with diabetic autonomic (poly)neuropathy: Secondary | ICD-10-CM

## 2018-02-22 DIAGNOSIS — D696 Thrombocytopenia, unspecified: Secondary | ICD-10-CM

## 2018-02-22 NOTE — Progress Notes (Signed)
Patient ID: Angela Patterson, female   DOB: Jan 27, 1921, 84 y.o.   MRN: [id number]   Subjective:    Patient ID: Angela Patterson, female    DOB: Jun 09, 1921, 84 y.o.   MRN: [id number]  HPI  Patient here to discuss some paper work that needs to be completed.  Needs [employer] benefits form completed.  She is accompanied by her son.  History obtained from both of them.  Discussed her her ability to maneuver around the home.  Experiences some unsteadiness at times.  No dizziness.  No light headedness.  Discussed using her cane and walker.  She does not cook.  Can heat up food.  Family is very involved.  Her daughter helps to manage her medications.  Places them in a pill box and makes sure she takes them.  She also helps her with her finances.  She does not drive.  Family transports her.  She had two falls in the last month.  States she just stepped wrong.  On residual injuries or problems.  Again discussed using her cane/walker.  States sugars are doing well.  Sugars <200.  No chest pan.  Breathing stable.  No nausea or vomiting.  Loose stools intermittently.  Feels is stable.   Seeing podiatry for foot wound.     Past Medical History:  Diagnosis Date  . Allergy   . Chicken pox   . CVA (cerebral vascular accident) ([place])   . Diabetes mellitus ([place])   . Hypercholesterolemia   . Hypertension    Past Surgical History:  Procedure Laterality Date  . ABDOMINAL HYSTERECTOMY  1971   excessive bleeding  . APPENDECTOMY  age 7  . BREAST LUMPECTOMY  1958   benign  . DILATION AND CURETTAGE OF UTERUS  1970   Family History  Problem Relation Age of Onset  . Liver cancer Father   . Stroke Mother   . Diabetes Brother   . Hypertension Daughter   . Hypertension Son   . Hypertension Daughter   . Cancer Grandchild        breast  . Diabetes Grandchild   . Breast cancer Neg Hx   . Colon cancer Neg Hx    Social History   Socioeconomic History  . Marital status: Widowed    Spouse name: Not on file  . Number of  children: 3  . Years of education: Not on file  . Highest education level: Not on file  Occupational History  . Not on file  Social Needs  . Financial resource strain: Not on file  . Food insecurity:    Worry: Not on file    Inability: Not on file  . Transportation needs:    Medical: Not on file    Non-medical: Not on file  Tobacco Use  . Smoking status: Never Smoker  . Smokeless tobacco: Never Used  Substance and Sexual Activity  . Alcohol use: No    Alcohol/week: 0.0 standard drinks  . Drug use: No  . Sexual activity: Not on file  Lifestyle  . Physical activity:    Days per week: Not on file    Minutes per session: Not on file  . Stress: Not on file  Relationships  . Social connections:    Talks on phone: Not on file    Gets together: Not on file    Attends religious service: Not on file    Active member of club or organization: Not on file    Attends meetings of clubs  or organizations: Not on file    Relationship status: Not on file  Other Topics Concern  . Not on file  Social History Narrative  . Not on file    Outpatient Encounter Medications as of 02/22/2018  Medication Sig  . amLODipine (NORVASC) 5 MG tablet TAKE 1 TABLET BY MOUTH TWO  TIMES DAILY  . clopidogrel (PLAVIX) 75 MG tablet TAKE 1 TABLET BY MOUTH  EVERY DAY  . gentamicin cream (GARAMYCIN) 0.1 % Apply 1 application topically 3 (three) times daily.  Marland Kitchen glucose blood (ONE TOUCH ULTRA TEST) test strip TEST TWO TIMES DAILY  . Lancet Devices (ONE TOUCH DELICA LANCING DEV) MISC Use twice daily Dx: 250.00  . metFORMIN (GLUCOPHAGE-XR) 500 MG 24 hr tablet TAKE 1 TABLET BY MOUTH TWO  TIMES DAILY BEFORE A MEAL.  . Multiple Vitamin (MULTIVITAMIN) tablet Take 1 tablet by mouth daily.  . Probiotic Product (PROBIOTIC DAILY PO) Take 1 tablet by mouth daily.  . timolol (BETIMOL) 0.5 % ophthalmic solution 1 drop 2 (two) times daily.  . timolol (TIMOPTIC) 0.5 % ophthalmic solution    No facility-administered encounter  medications on file as of 02/22/2018.     Review of Systems  Constitutional: Negative for appetite change and unexpected weight change.  HENT: Negative for congestion and sinus pressure.   Respiratory: Negative for cough, chest tightness and shortness of breath.   Cardiovascular: Negative for chest pain, palpitations and leg swelling.  Gastrointestinal: Negative for abdominal pain, nausea and vomiting.       Loose stools.  Overall feels is better.    Genitourinary: Negative for difficulty urinating and dysuria.  Musculoskeletal: Negative for joint swelling and myalgias.  Skin: Negative for color change and rash.  Neurological: Negative for dizziness, light-headedness and headaches.  Psychiatric/Behavioral: Negative for agitation and dysphoric mood.       Objective:    Physical Exam  Constitutional: She appears well-developed and well-nourished. No distress.  HENT:  Nose: Nose normal.  Mouth/Throat: Oropharynx is clear and moist.  Neck: Neck supple. No thyromegaly present.  Cardiovascular: Normal rate and regular rhythm.  Pulmonary/Chest: Breath sounds normal. No respiratory distress. She has no wheezes.  Abdominal: Soft. Bowel sounds are normal. There is no tenderness.  Musculoskeletal: She exhibits no edema or tenderness.  Lymphadenopathy:    She has no cervical adenopathy.  Skin: No rash noted. No erythema.  Psychiatric: She has a normal mood and affect. Her behavior is normal.    BP 128/64 (BP Location: Left Arm, Patient Position: Sitting, Cuff Size: Normal)   Pulse 83   Temp 98.2 F (36.8 C) (Oral)   Resp 18   Ht '5\' 4"'$  (1.626 m)   Wt 106 lb (48.1 kg)   LMP 07/21/1966   SpO2 95%   BMI 18.19 kg/m  Wt Readings from Last 3 Encounters:  02/22/18 106 lb (48.1 kg)  12/10/17 105 lb 3.2 oz (47.7 kg)  09/03/17 106 lb 6.4 oz (48.3 kg)     Lab Results  Component Value Date   WBC 8.3 12/08/2017   HGB 15.1 (H) 12/08/2017   HCT 44.6 12/08/2017   PLT 150.0 12/08/2017    GLUCOSE 155 (H) 12/08/2017   CHOL 173 12/08/2017   TRIG 194.0 (H) 12/08/2017   HDL 44.90 12/08/2017   LDLDIRECT 66.4 05/24/2013   LDLCALC 90 12/08/2017   ALT 12 12/08/2017   AST 17 12/08/2017   NA 140 12/08/2017   K 4.5 12/08/2017   CL 103 12/08/2017   CREATININE 0.65  12/08/2017   BUN 15 12/08/2017   CO2 31 12/08/2017   TSH 3.06 02/22/2017   HGBA1C 6.8 (H) 12/08/2017   MICROALBUR 3.0 (H) 02/24/2017    Dg Foot Complete Left  Result Date: 07/30/2015 CLINICAL DATA:  Nonhealing wound. EXAM: LEFT FOOT - COMPLETE 3+ VIEW COMPARISON:  MRI 10/09/2014. FINDINGS: Diffuse osteopenia and degenerative change. No acute bony abnormality identified. A a wound with a bandage is noted over the plantar aspect of the distal foot. No adjacent acute bony abnormality. If osteomyelitis is of concern MRI can be obtained. IMPRESSION: Soft tissue wound with a bandage is noted over the plantar aspect of the distal left foot. Diffuse osteopenia degenerative change. No acute bony abnormality. Electronically Signed   By: Marcello Moores  Register   On: 07/30/2015 15:46       Assessment & Plan:   Problem List Items Addressed This Visit    Diabetes mellitus ([place])    Sugars have been doing well.  Follow met b and a1c.        Diarrhea    Persistent loose stools, but she feels is better.  Discussed further evaluation.  She declines GI evaluation.  Probiotics as directed.  Have changed to metformin extended release.       Essential (primary) hypertension    Blood pressure under good control.  Continue same medication regimen.  Follow pressures.  Follow metabolic panel.        Foot ulcer ([place])    Seeing podiatry now.  Evaluated today.       History of CVA (cerebrovascular accident)    On plavix and doing well.  Follow.        Hypercholesterolemia    Follow lipid panel.       Thrombocytopenia ([place])    Follow cbc.         Other Visit Diagnoses    Encounter for completion of form with patient    -  Primary     [employer] benefits form completed.  Original given to son at appt.         Einar Pheasant, MD

## 2018-02-24 ENCOUNTER — Encounter: Payer: Self-pay | Admitting: Internal Medicine

## 2018-02-24 NOTE — Assessment & Plan Note (Signed)
Follow lipid panel.   

## 2018-02-24 NOTE — Assessment & Plan Note (Signed)
Seeing podiatry now.  Evaluated today.

## 2018-02-24 NOTE — Assessment & Plan Note (Signed)
On plavix and doing well.  Follow.   

## 2018-02-24 NOTE — Assessment & Plan Note (Signed)
Sugars have been doing well.  Follow met b and a1c.   

## 2018-02-24 NOTE — Assessment & Plan Note (Signed)
Follow cbc.  

## 2018-02-24 NOTE — Assessment & Plan Note (Signed)
Persistent loose stools, but she feels is better.  Discussed further evaluation.  She declines GI evaluation.  Probiotics as directed.  Have changed to metformin extended release.

## 2018-02-24 NOTE — Assessment & Plan Note (Signed)
Blood pressure under good control.  Continue same medication regimen.  Follow pressures.  Follow metabolic panel.   

## 2018-02-25 NOTE — Progress Notes (Signed)
   Subjective:  84 year old female with PMHx of DM presenting today for follow up evaluation of an ulceration of the sub-first MPJ of the left foot. She states she is doing well overall. She denies any pain or new complaints at this time. Patient is here for further evaluation and treatment.   Past Medical History:  Diagnosis Date  . Allergy   . Chicken pox   . CVA (cerebral vascular accident) ([place])   . Diabetes mellitus ([place])   . Hypercholesterolemia   . Hypertension       Objective/Physical Exam General: The patient is alert and oriented x3 in no acute distress.  Dermatology:  Wound #1 noted to the sub-first MPJ of the left foot measuring 1.5 x 3.0 x 0.1 cm (LxWxD).   To the noted ulceration(s), there is no eschar. There is a moderate amount of slough, fibrin, and necrotic tissue noted. Granulation tissue and wound base is red. There is a minimal amount of serosanguineous drainage noted. There is no exposed bone muscle-tendon ligament or joint. There is no malodor. Periwound integrity is intact. Skin is warm, dry and supple bilateral lower extremities.  Vascular: Palpable pedal pulses bilaterally. No edema or erythema noted. Capillary refill within normal limits.  Neurological: Epicritic and protective threshold diminished bilaterally.   Musculoskeletal Exam: Range of motion within normal limits to all pedal and ankle joints bilateral. Muscle strength 5/5 in all groups bilateral.   Assessment: #1 ulceration of the sub-first MPJ of the left foot secondary to diabetes mellitus #2 diabetes mellitus w/ peripheral neuropathy   Plan of Care:  #1 Patient was evaluated. #2 medically necessary excisional debridement including subcutaneous tissue was performed using a tissue nipper and a chisel blade. Excisional debridement of all the necrotic nonviable tissue down to healthy bleeding viable tissue was performed with post-debridement measurements same as pre-. #3 the wound was cleansed  and dry sterile dressing applied. #4 Continue using gentamicin cream daily with a bandage.  #5 Continue wearing DM shoes and insoles.  #6 Return to clinic in 3 weeks.    [name], DPM [facility]  Dr. [name], [address]                                        [place], [place] [address]                Office [phone]  Fax [phone]

## 2018-03-15 ENCOUNTER — Other Ambulatory Visit: Payer: Self-pay

## 2018-03-15 ENCOUNTER — Encounter: Payer: Self-pay | Admitting: Podiatry

## 2018-03-15 ENCOUNTER — Ambulatory Visit: Payer: Medicare Other | Admitting: Podiatry

## 2018-03-15 DIAGNOSIS — L97522 Non-pressure chronic ulcer of other part of left foot with fat layer exposed: Secondary | ICD-10-CM

## 2018-03-15 DIAGNOSIS — E0843 Diabetes mellitus due to underlying condition with diabetic autonomic (poly)neuropathy: Secondary | ICD-10-CM

## 2018-03-15 DIAGNOSIS — I70245 Atherosclerosis of native arteries of left leg with ulceration of other part of foot: Secondary | ICD-10-CM

## 2018-03-16 NOTE — Progress Notes (Signed)
   Subjective:  84 year old female with PMHx of DM presenting today for follow up evaluation of an ulceration of the sub-first MPJ of the left foot. She states she is doing well overall. She states the wound is healing appropriately. She has been using the gentamicin cream as directed. Patient is here for further evaluation and treatment.   Past Medical History:  Diagnosis Date  . Allergy   . Chicken pox   . CVA (cerebral vascular accident) ([place])   . Diabetes mellitus ([place])   . Hypercholesterolemia   . Hypertension       Objective/Physical Exam General: The patient is alert and oriented x3 in no acute distress.  Dermatology:  Wound #1 noted to the sub-first MPJ of the left foot measuring 0.8 x 0.3 x 0.2 cm (LxWxD).   To the noted ulceration(s), there is no eschar. There is a moderate amount of slough, fibrin, and necrotic tissue noted. Granulation tissue and wound base is red. There is a minimal amount of serosanguineous drainage noted. There is no exposed bone muscle-tendon ligament or joint. There is no malodor. Periwound integrity is intact. Skin is warm, dry and supple bilateral lower extremities.  Vascular: Palpable pedal pulses bilaterally. No edema or erythema noted. Capillary refill within normal limits.  Neurological: Epicritic and protective threshold diminished bilaterally.   Musculoskeletal Exam: Range of motion within normal limits to all pedal and ankle joints bilateral. Muscle strength 5/5 in all groups bilateral.   Assessment: #1 ulceration of the sub-first MPJ of the left foot secondary to diabetes mellitus #2 diabetes mellitus w/ peripheral neuropathy   Plan of Care:  #1 Patient was evaluated. #2 medically necessary excisional debridement including subcutaneous tissue was performed using a tissue nipper and a chisel blade. Excisional debridement of all the necrotic nonviable tissue down to healthy bleeding viable tissue was performed with post-debridement  measurements same as pre-. #3 the wound was cleansed and dry sterile dressing applied. #4 Continue using gentamicin cream daily with a bandage.  #5 Continue wearing DM shoes and insoles.  #6 Return to clinic in 3 weeks.    [name], DPM [facility]  Dr. [name], [address]                                        [place], [place] [address]                Office [phone]  Fax [phone]

## 2018-04-05 ENCOUNTER — Encounter: Payer: Self-pay | Admitting: Podiatry

## 2018-04-05 ENCOUNTER — Ambulatory Visit: Payer: Medicare Other | Admitting: Podiatry

## 2018-04-05 DIAGNOSIS — I70245 Atherosclerosis of native arteries of left leg with ulceration of other part of foot: Secondary | ICD-10-CM

## 2018-04-05 DIAGNOSIS — L97522 Non-pressure chronic ulcer of other part of left foot with fat layer exposed: Secondary | ICD-10-CM

## 2018-04-05 DIAGNOSIS — E0843 Diabetes mellitus due to underlying condition with diabetic autonomic (poly)neuropathy: Secondary | ICD-10-CM

## 2018-04-06 NOTE — Progress Notes (Signed)
   Subjective:  84 year old female with PMHx of DM presenting today for follow up evaluation of an ulceration of the sub-first MPJ of the left foot. She states she is doing well. She denies any pain or modifying factors. She denies any new complaints. She has been using gentamicin cream on the wound as directed. Patient is here for further evaluation and treatment.   Past Medical History:  Diagnosis Date  . Allergy   . Chicken pox   . CVA (cerebral vascular accident) ([place])   . Diabetes mellitus ([place])   . Hypercholesterolemia   . Hypertension       Objective/Physical Exam General: The patient is alert and oriented x3 in no acute distress.  Dermatology:  Wound #1 noted to the sub-first MPJ of the left foot measuring 0.5 x 0.3 x 0.2 cm (LxWxD).   To the noted ulceration(s), there is no eschar. There is a moderate amount of slough, fibrin, and necrotic tissue noted. Granulation tissue and wound base is red. There is a minimal amount of serosanguineous drainage noted. There is no exposed bone muscle-tendon ligament or joint. There is no malodor. Periwound integrity is intact. Skin is warm, dry and supple bilateral lower extremities.  Vascular: Palpable pedal pulses bilaterally. No edema or erythema noted. Capillary refill within normal limits.  Neurological: Epicritic and protective threshold diminished bilaterally.   Musculoskeletal Exam: Range of motion within normal limits to all pedal and ankle joints bilateral. Muscle strength 5/5 in all groups bilateral.   Assessment: #1 ulceration of the sub-first MPJ of the left foot secondary to diabetes mellitus #2 diabetes mellitus w/ peripheral neuropathy   Plan of Care:  #1 Patient was evaluated. #2 medically necessary excisional debridement including subcutaneous tissue was performed using a tissue nipper and a chisel blade. Excisional debridement of all the necrotic nonviable tissue down to healthy bleeding viable tissue was performed  with post-debridement measurements same as pre-. #3 the wound was cleansed and dry sterile dressing applied. #4 Continue using gentamicin cream daily with a dry sterile dressing three times a week.  #5 Continue wearing DM shoes and insoles.  #6 Return to clinic in 4 weeks.    [name], DPM [facility]  Dr. [name], [address]                                        [place], [place] [address]                Office [phone]  Fax [phone]

## 2018-04-21 ENCOUNTER — Other Ambulatory Visit: Payer: Self-pay | Admitting: Internal Medicine

## 2018-05-03 ENCOUNTER — Ambulatory Visit: Payer: Medicare Other | Admitting: Podiatry

## 2018-05-03 ENCOUNTER — Encounter: Payer: Self-pay | Admitting: Podiatry

## 2018-05-03 DIAGNOSIS — E0843 Diabetes mellitus due to underlying condition with diabetic autonomic (poly)neuropathy: Secondary | ICD-10-CM

## 2018-05-03 DIAGNOSIS — I70245 Atherosclerosis of native arteries of left leg with ulceration of other part of foot: Secondary | ICD-10-CM

## 2018-05-03 DIAGNOSIS — L97522 Non-pressure chronic ulcer of other part of left foot with fat layer exposed: Secondary | ICD-10-CM

## 2018-05-06 NOTE — Progress Notes (Signed)
   Subjective:  84 year old female with PMHx of DM presenting today for follow up evaluation of an ulceration of the sub-first MPJ of the left foot. She states she is doing well overall and the wound is healing appropriately. She denies any significant pain or modifying factors. She has been using the Gentamicin cream daily and using the offloading pads as directed. Patient is here for further evaluation and treatment.   Past Medical History:  Diagnosis Date  . Allergy   . Chicken pox   . CVA (cerebral vascular accident) ([place])   . Diabetes mellitus ([place])   . Hypercholesterolemia   . Hypertension       Objective/Physical Exam General: The patient is alert and oriented x3 in no acute distress.  Dermatology:  Wound #1 noted to the sub-first MPJ of the left foot measuring 0.7 x 0.7 x 0.1 cm (LxWxD).   To the noted ulceration(s), there is no eschar. There is a moderate amount of slough, fibrin, and necrotic tissue noted. Granulation tissue and wound base is red. There is a minimal amount of serosanguineous drainage noted. There is no exposed bone muscle-tendon ligament or joint. There is no malodor. Periwound integrity is intact. Skin is warm, dry and supple bilateral lower extremities.  Vascular: Palpable pedal pulses bilaterally. No edema or erythema noted. Capillary refill within normal limits.  Neurological: Epicritic and protective threshold diminished bilaterally.   Musculoskeletal Exam: Range of motion within normal limits to all pedal and ankle joints bilateral. Muscle strength 5/5 in all groups bilateral.   Assessment: #1 ulceration of the sub-first MPJ of the left foot secondary to diabetes mellitus #2 diabetes mellitus w/ peripheral neuropathy   Plan of Care:  #1 Patient was evaluated. #2 medically necessary excisional debridement including subcutaneous tissue was performed using a tissue nipper and a chisel blade. Excisional debridement of all the necrotic nonviable  tissue down to healthy bleeding viable tissue was performed with post-debridement measurements same as pre-. #3 the wound was cleansed and dry sterile dressing applied. #4 Continue using gentamicin cream and offloading pads.   #5 Continue wearing DM shoes and insoles.  #6 Return to clinic in 4 weeks.    [name], DPM [facility]  Dr. [name], [address]                                        [place],  [address]                Office [phone]  Fax [phone]

## 2018-05-26 ENCOUNTER — Ambulatory Visit: Payer: Self-pay | Admitting: *Deleted

## 2018-05-26 ENCOUNTER — Ambulatory Visit (INDEPENDENT_AMBULATORY_CARE_PROVIDER_SITE_OTHER): Payer: Medicare Other | Admitting: Family Medicine

## 2018-05-26 ENCOUNTER — Encounter: Payer: Self-pay | Admitting: Family Medicine

## 2018-05-26 VITALS — BP 132/60 | HR 78 | Temp 98.4°F | Ht 65.0 in | Wt 108.8 lb

## 2018-05-26 DIAGNOSIS — K625 Hemorrhage of anus and rectum: Secondary | ICD-10-CM | POA: Diagnosis not present

## 2018-05-26 DIAGNOSIS — K649 Unspecified hemorrhoids: Secondary | ICD-10-CM | POA: Diagnosis not present

## 2018-05-26 LAB — CBC WITH DIFFERENTIAL/PLATELET
Basophils Absolute: 0.1 10*3/uL (ref 0.0–0.1)
Basophils Relative: 0.5 % (ref 0.0–3.0)
Eosinophils Absolute: 0.1 10*3/uL (ref 0.0–0.7)
Eosinophils Relative: 0.9 % (ref 0.0–5.0)
HCT: 42.8 % (ref 36.0–46.0)
Hemoglobin: 14.5 g/dL (ref 12.0–15.0)
Lymphocytes Relative: 22.3 % (ref 12.0–46.0)
Lymphs Abs: 2.4 10*3/uL (ref 0.7–4.0)
MCHC: 33.8 g/dL (ref 30.0–36.0)
MCV: 94.5 fl (ref 78.0–100.0)
Monocytes Absolute: 1 10*3/uL (ref 0.1–1.0)
Monocytes Relative: 9.1 % (ref 3.0–12.0)
Neutro Abs: 7.2 10*3/uL (ref 1.4–7.7)
Neutrophils Relative %: 67.2 % (ref 43.0–77.0)
Platelets: 157 10*3/uL (ref 150.0–400.0)
RBC: 4.53 Mil/uL (ref 3.87–5.11)
RDW: 13.1 % (ref 11.5–15.5)
WBC: 10.7 10*3/uL — ABNORMAL HIGH (ref 4.0–10.5)

## 2018-05-26 MED ORDER — HYDROCORTISONE 1 % RE CREA: TOPICAL_CREAM | RECTAL | 1 refills | Status: DC

## 2018-05-26 NOTE — Telephone Encounter (Signed)
Agree with evaluation

## 2018-05-26 NOTE — Progress Notes (Signed)
Subjective:    Patient ID: Angela Patterson, female    DOB: Sep 10, 1920, 84 y.o.   MRN: [id number]  HPI  Patient presents to clinic with episode of rectal bleeding this morning.  She had regular BM, than a few minutes later went back to the bathroom and saw blood in toilet. States it was enough to turn the toilet water dark red.  Patient has not had any nausea, vomiting or diarrhea. No ABD pain. Daughter states patient has had episode of rectal bleeding before in February 2019; at that time it was intermittent & she had some blood on tissue with wiping. Patient is on plavix 75 mg daily.   Most recent CBC reviewed: CBC Latest Ref Rng & Units 12/08/2017 07/07/2017 07/07/2016  WBC 4.0 - 10.5 K/uL 8.3 7.9 7.8  Hemoglobin 12.0 - 15.0 g/dL 15.1(H) 14.9 14.1  Hematocrit 36.0 - 46.0 % 44.6 44.8 41.6  Platelets 150.0 - 400.0 K/uL 150.0 141.0(L) 157.0    Patient Active Problem List   Diagnosis Date Noted  . Diarrhea 12/13/2017  . Rectal bleeding 07/07/2017  . Foot ulcer ([place]) 04/18/2017  . Open wound of foot excluding toes without complication 01/75/1025  . Health care maintenance 10/27/2014  . Weight loss 12/27/2013  . Thrombocytopenia ([place]) 11/20/2012  . Hypertension 07/21/2012  . Hypercholesterolemia 07/21/2012  . History of CVA (cerebrovascular accident) 07/21/2012  . Diabetes mellitus ([place]) 07/21/2012  . Essential (primary) hypertension 07/21/2012  . Pure hypercholesterolemia 07/21/2012  . Type 2 diabetes mellitus ([place]) 07/21/2012   Social History   Tobacco Use  . Smoking status: Never Smoker  . Smokeless tobacco: Never Used  Substance Use Topics  . Alcohol use: No    Alcohol/week: 0.0 standard drinks   Review of Systems  Constitutional: Negative for chills, fatigue and fever.  HENT: Negative for congestion, ear pain, sinus pain and sore throat.   Eyes: Negative.   Respiratory: Negative for cough, shortness of breath and wheezing.   Cardiovascular: Negative for chest pain,  palpitations and leg swelling.  Gastrointestinal: Negative for abdominal pain, diarrhea, nausea and vomiting. Rectal bleeding x1 Genitourinary: Negative for dysuria, frequency and urgency.  Musculoskeletal: Negative for arthralgias and myalgias.  Skin: Negative for color change, pallor and rash.  Neurological: Negative for syncope, light-headedness and headaches.  Psychiatric/Behavioral: The patient is not nervous/anxious.       Objective:   Physical Exam  Constitutional: She is oriented to person, place, and time. She appears well-nourished. No distress.  HENT:  Head: Normocephalic and atraumatic.  Eyes: Conjunctivae and EOM are normal. No scleral icterus.  Neck: No tracheal deviation present.  Cardiovascular: Normal rate, regular rhythm and normal heart sounds.  Pulmonary/Chest: Effort normal and breath sounds normal. No respiratory distress.  Abdominal: Soft. Bowel sounds are normal. She exhibits no distension and no mass. There is no tenderness. There is no rebound and no guarding.  Genitourinary:  Genitourinary Comments: External hemorrhoids seen on rectal exam.  Musculoskeletal: She exhibits no edema.  Neurological: She is alert and oriented to person, place, and time.  Skin: Skin is warm and dry. Capillary refill takes less than 2 seconds. She is not diaphoretic. No pallor.  Psychiatric: She has a normal mood and affect. Her behavior is normal.      Vitals:   05/26/18 1311  BP: 132/60  Pulse: 78  Temp: 98.4 F (36.9 C)  SpO2: 98%   Assessment & Plan:   Rectal bleeding/hemorrhoids - patient will get CBC drawn in clinic today  to be sure blood levels have remained stable.  She will use steroid rectal cream to help shrink up hemorrhoids.  Patient advised to avoid straining when having a bowel movement.  Patient advised to increase fiber in diet to help keep stools soft and bowel movements easy and regular.  Patient given handout outlining information about hemorrhoids and when  to contact doctor; Contact a doctor if:  You have any of these: ? Pain and swelling that do not get better with treatment or medicine. ? Bleeding that will not stop. ? Trouble pooping or you cannot poop.  ? Pain or swelling outside the area of the hemorrhoids.   Keep regularly scheduled follow-up with PCP as planned.  Return to clinic sooner if issues arise.  Patient advised she can use hemorrhoid cream twice a day for the next 1 to 2 weeks, then can stop using and see how things go.  Advised if hemorrhoids begin to cause issues again she can resume use of cream.

## 2018-05-26 NOTE — Telephone Encounter (Signed)
  Daughter is calling to report that her mother had episode of rectal bleeding this morning. She does have a history of this in the past- but this is the first time since February.  Call to office- they are comfortable giving option of waiting to see if she has reoccurrence or scheduling patient in office. Daughter given options- she discussed with mother and they have decided to go ahead and take the appointment. Reason for Disposition . Taking Coumadin (warfarin) or other strong blood thinner, or known bleeding disorder (e.g., thrombocytopenia)  Answer Assessment - Initial Assessment Questions 1. APPEARANCE of BLOOD: "What color is it?" "Is it passed separately, on the surface of the stool, or mixed in with the stool?"      Dark red, separately in toilet 2. AMOUNT: "How much blood was passed?"      No clots- enough to turn toilet water red 3. FREQUENCY: "How many times has blood been passed with the stools?"      1 times- today 4. ONSET: "When was the blood first seen in the stools?" (Days or weeks)      7:45 this morning 5. DIARRHEA: "Is there also some diarrhea?" If so, ask: "How many diarrhea stools were passed in past 24 hours?"      no 6. CONSTIPATION: "Do you have constipation?" If so, "How bad is it?"     normal 7. RECURRENT SYMPTOMS: "Have you had blood in your stools before?" If so, ask: "When was the last time?" and "What happened that time?"      Yes- periodically 8. BLOOD THINNERS: "Do you take any blood thinners?" (e.g., Coumadin/warfarin, Pradaxa/dabigatran, aspirin)     yes 9. OTHER SYMPTOMS: "Do you have any other symptoms?"  (e.g., abdominal pain, vomiting, dizziness, fever)     no 10. PREGNANCY: "Is there any chance you are pregnant?" "When was your last menstrual period?"       n/a  Protocols used: RECTAL BLEEDING-A-AH

## 2018-05-26 NOTE — Patient Instructions (Signed)
Hemorrhoids    Hemorrhoids are swollen veins in and around the rectum or anus. Hemorrhoids can cause pain, itching, or bleeding. Most of the time, they do not cause serious problems. They usually get better with diet changes, lifestyle changes, and other home treatments.  Follow these instructions at home:  Eating and drinking  · Eat foods that have fiber, such as whole grains, beans, nuts, fruits, and vegetables. Ask your doctor about taking products that have added fiber (fiber supplements).  · Drink enough fluid to keep your pee (urine) clear or pale yellow.  For Pain and Swelling  · Take a warm-water bath (sitz bath) for 20 minutes to ease pain. Do this 3-4 times a day.  · If directed, put ice on the painful area. It may be helpful to use ice between your warm baths.  ¨ Put ice in a plastic bag.  ¨ Place a towel between your skin and the bag.  ¨ Leave the ice on for 20 minutes, 2-3 times a day.  General instructions  · Take over-the-counter and prescription medicines only as told by your doctor.  ¨ Medicated creams and medicines that are inserted into the anus (suppositories) may be used or applied as told.  · Exercise often.  · Go to the bathroom when you have the urge to poop (to have a bowel movement). Do not wait.  · Avoid pushing too hard (straining) when you poop.  · Keep the butt area dry and clean. Use wet toilet paper or moist paper towels.  · Do not sit on the toilet for a long time.  Contact a doctor if:  · You have any of these:  ¨ Pain and swelling that do not get better with treatment or medicine.  ¨ Bleeding that will not stop.  ¨ Trouble pooping or you cannot poop.  ¨ Pain or swelling outside the area of the hemorrhoids.  This information is not intended to replace advice given to you by your health care provider. Make sure you discuss any questions you have with your health care provider.  Document Released: 04/14/2008 Document Revised: 12/12/2015 Document Reviewed: 03/20/2015  Elsevier  Interactive Patient Education © 2018 [employer] Inc.   

## 2018-05-26 NOTE — Telephone Encounter (Signed)
I know you spoke with [facility] regarding this patient earlier so just an [place].

## 2018-05-31 ENCOUNTER — Ambulatory Visit: Payer: Medicare Other | Admitting: Podiatry

## 2018-05-31 ENCOUNTER — Encounter: Payer: Self-pay | Admitting: Podiatry

## 2018-05-31 DIAGNOSIS — L97522 Non-pressure chronic ulcer of other part of left foot with fat layer exposed: Secondary | ICD-10-CM

## 2018-05-31 DIAGNOSIS — I70245 Atherosclerosis of native arteries of left leg with ulceration of other part of foot: Secondary | ICD-10-CM

## 2018-05-31 DIAGNOSIS — E0843 Diabetes mellitus due to underlying condition with diabetic autonomic (poly)neuropathy: Secondary | ICD-10-CM

## 2018-05-31 NOTE — Progress Notes (Signed)
   Subjective:  84 year old female with PMHx of DM presenting today for follow up evaluation of an ulceration of the sub-first MPJ of the left foot. She states she is doing well. She denies any significant pain or modifying factors. Patient is here for further evaluation and treatment.   Past Medical History:  Diagnosis Date  . Allergy   . Chicken pox   . CVA (cerebral vascular accident) ([place])   . Diabetes mellitus ([place])   . Hypercholesterolemia   . Hypertension       Objective/Physical Exam General: The patient is alert and oriented x3 in no acute distress.  Dermatology:  Wound #1 noted to the sub-first MPJ of the left foot measuring 0.7 x 0.3 x 0.2 cm (LxWxD).   To the noted ulceration(s), there is no eschar. There is a moderate amount of slough, fibrin, and necrotic tissue noted. Granulation tissue and wound base is red. There is a minimal amount of serosanguineous drainage noted. There is no exposed bone muscle-tendon ligament or joint. There is no malodor. Periwound integrity is intact. Skin is warm, dry and supple bilateral lower extremities.  Vascular: Palpable pedal pulses bilaterally. No edema or erythema noted. Capillary refill within normal limits.  Neurological: Epicritic and protective threshold diminished bilaterally.   Musculoskeletal Exam: Range of motion within normal limits to all pedal and ankle joints bilateral. Muscle strength 5/5 in all groups bilateral.   Assessment: #1 ulceration of the sub-first MPJ of the left foot secondary to diabetes mellitus #2 diabetes mellitus w/ peripheral neuropathy   Plan of Care:  #1 Patient was evaluated. #2 medically necessary excisional debridement including subcutaneous tissue was performed using a tissue nipper and a chisel blade. Excisional debridement of all the necrotic nonviable tissue down to healthy bleeding viable tissue was performed with post-debridement measurements same as pre-. #3 the wound was cleansed and  dry sterile dressing applied. #4 Continue using gentamicin cream and dry sterile dressing daily.   #5 Dancer's offloading pads dispensed.  #6 Continue wearing DM shoes and insoles.  #7 Return to clinic in 4 weeks.    [name], DPM [facility]  Dr. [name], [address]                                        [place], [place] [address]                Office [phone]  Fax [phone]

## 2018-06-20 ENCOUNTER — Other Ambulatory Visit: Payer: Medicare Other

## 2018-06-21 DIAGNOSIS — H401131 Primary open-angle glaucoma, bilateral, mild stage: Secondary | ICD-10-CM | POA: Diagnosis not present

## 2018-06-22 ENCOUNTER — Encounter: Payer: Self-pay | Admitting: Internal Medicine

## 2018-06-22 ENCOUNTER — Ambulatory Visit (INDEPENDENT_AMBULATORY_CARE_PROVIDER_SITE_OTHER): Payer: Medicare Other | Admitting: Internal Medicine

## 2018-06-22 DIAGNOSIS — R197 Diarrhea, unspecified: Secondary | ICD-10-CM

## 2018-06-22 DIAGNOSIS — E7800 Pure hypercholesterolemia, unspecified: Secondary | ICD-10-CM

## 2018-06-22 DIAGNOSIS — D696 Thrombocytopenia, unspecified: Secondary | ICD-10-CM

## 2018-06-22 DIAGNOSIS — I10 Essential (primary) hypertension: Secondary | ICD-10-CM | POA: Diagnosis not present

## 2018-06-22 DIAGNOSIS — E1140 Type 2 diabetes mellitus with diabetic neuropathy, unspecified: Secondary | ICD-10-CM | POA: Diagnosis not present

## 2018-06-22 DIAGNOSIS — L97529 Non-pressure chronic ulcer of other part of left foot with unspecified severity: Secondary | ICD-10-CM

## 2018-06-22 DIAGNOSIS — F419 Anxiety disorder, unspecified: Secondary | ICD-10-CM

## 2018-06-22 LAB — HEMOGLOBIN A1C: Hgb A1c MFr Bld: 7.1 % — ABNORMAL HIGH (ref 4.6–6.5)

## 2018-06-22 LAB — CBC WITH DIFFERENTIAL/PLATELET
Basophils Absolute: 0.1 10*3/uL (ref 0.0–0.1)
Basophils Relative: 0.8 % (ref 0.0–3.0)
Eosinophils Absolute: 0.1 10*3/uL (ref 0.0–0.7)
Eosinophils Relative: 0.5 % (ref 0.0–5.0)
HCT: 44.1 % (ref 36.0–46.0)
Hemoglobin: 14.9 g/dL (ref 12.0–15.0)
Lymphocytes Relative: 18.4 % (ref 12.0–46.0)
Lymphs Abs: 2.1 10*3/uL (ref 0.7–4.0)
MCHC: 33.7 g/dL (ref 30.0–36.0)
MCV: 95 fl (ref 78.0–100.0)
Monocytes Absolute: 0.9 10*3/uL (ref 0.1–1.0)
Monocytes Relative: 7.6 % (ref 3.0–12.0)
Neutro Abs: 8.2 10*3/uL — ABNORMAL HIGH (ref 1.4–7.7)
Neutrophils Relative %: 72.7 % (ref 43.0–77.0)
Platelets: 152 10*3/uL (ref 150.0–400.0)
RBC: 4.64 Mil/uL (ref 3.87–5.11)
RDW: 13 % (ref 11.5–15.5)
WBC: 11.3 10*3/uL — ABNORMAL HIGH (ref 4.0–10.5)

## 2018-06-22 LAB — HEPATIC FUNCTION PANEL
ALT: 15 U/L (ref 0–35)
AST: 19 U/L (ref 0–37)
Albumin: 4.6 g/dL (ref 3.5–5.2)
Alkaline Phosphatase: 68 U/L (ref 39–117)
Bilirubin, Direct: 0.1 mg/dL (ref 0.0–0.3)
Total Bilirubin: 0.7 mg/dL (ref 0.2–1.2)
Total Protein: 8.3 g/dL (ref 6.0–8.3)

## 2018-06-22 LAB — BASIC METABOLIC PANEL
BUN: 18 mg/dL (ref 6–23)
CO2: 28 mEq/L (ref 19–32)
Calcium: 10.5 mg/dL (ref 8.4–10.5)
Chloride: 100 mEq/L (ref 96–112)
Creatinine, Ser: 0.64 mg/dL (ref 0.40–1.20)
GFR: 91.59 mL/min (ref >60.00)
Glucose, Bld: 170 mg/dL — ABNORMAL HIGH (ref 70–99)
Potassium: 4 mEq/L (ref 3.5–5.1)
Sodium: 137 mEq/L (ref 135–145)

## 2018-06-22 LAB — LIPID PANEL
Cholesterol: 190 mg/dL (ref 0–200)
HDL: 56.7 mg/dL (ref >39.00)
LDL Cholesterol: 101 mg/dL — ABNORMAL HIGH (ref 0–99)
NonHDL: 133.13
Total CHOL/HDL Ratio: 3
Triglycerides: 159 mg/dL — ABNORMAL HIGH (ref 0.0–149.0)
VLDL: 31.8 mg/dL (ref 0.0–40.0)

## 2018-06-22 LAB — MICROALBUMIN / CREATININE URINE RATIO
Creatinine,U: 127.3 mg/dL
Microalb Creat Ratio: 3.4 mg/g (ref 0.0–30.0)
Microalb, Ur: 4.3 mg/dL — ABNORMAL HIGH (ref 0.0–1.9)

## 2018-06-22 LAB — TSH: TSH: 2.55 u[IU]/mL (ref 0.35–4.50)

## 2018-06-22 MED ORDER — SERTRALINE HCL 25 MG PO TABS: 25.0000 mg | ORAL_TABLET | Freq: Every day | ORAL | 2 refills | Status: DC

## 2018-06-22 MED ORDER — METFORMIN HCL ER 500 MG PO TB24: 500.0000 mg | ORAL_TABLET | Freq: Every day | ORAL | 0 refills | Status: DC

## 2018-06-22 NOTE — Assessment & Plan Note (Signed)
Follow cbc.  

## 2018-06-22 NOTE — Assessment & Plan Note (Signed)
Followed by podiatry 

## 2018-06-22 NOTE — Assessment & Plan Note (Signed)
Follow lipid panel.   

## 2018-06-22 NOTE — Assessment & Plan Note (Signed)
Increased stress and anxiety as outlined.  Discussed treatment.  Start zoloft 25mg  q day.  Follow.

## 2018-06-22 NOTE — Assessment & Plan Note (Signed)
Follow met b and a1c.  Last a1c 6.8.   Will decrease metformin to q day since sugars better.  Discussed that metformin can cause diarrhea, but her diarrhea is only intermittent.  Not sure metformin contributing.  Treat anxiety and stress as outlined.  Decrease metformin to daily.  Follow.

## 2018-06-22 NOTE — Progress Notes (Addendum)
Patient ID: Angela Patterson, female   DOB: 02-Feb-1921, 84 y.o.   MRN: [id number]   Subjective:    Patient ID: Angela Patterson, female    DOB: 1920/08/14, 85 y.o.   MRN: [id number]  HPI  Patient here for a scheduled follow up.  She is accompanied by her son.  History obtained from both of them.  Reports persistent problems with diarrhea. On questioning, this occurs only intermittently.  Appears to be worse and occur with increased stress and anxiety.  Discussed other possible causes. Had changed metformin to extended release.  Did not make a difference. In reviewing, only had several days where she had diarrhea.  Intermittent days and diarrhea only lasts one day.  No abdominal pain.  Had diarrhea this am. Some blood tinge on toilet paper when wiped.  No further bleeding.  Son had questions about her other medications.  Was concerned that plavix and amlodipine may be contributing to her diarrhea.  Did report that Ms Hucker does experience increased stress and anxiety.  Feels needs something to help level things out.  Eating and drinking well.  No nausea or vomiting.     Past Medical History:  Diagnosis Date  . Allergy   . Chicken pox   . CVA (cerebral vascular accident) ([place])   . Diabetes mellitus ([place])   . Hypercholesterolemia   . Hypertension    Past Surgical History:  Procedure Laterality Date  . ABDOMINAL HYSTERECTOMY  1971   excessive bleeding  . APPENDECTOMY  age 89  . BREAST LUMPECTOMY  1958   benign  . DILATION AND CURETTAGE OF UTERUS  1970   Family History  Problem Relation Age of Onset  . Liver cancer Father   . Stroke Mother   . Diabetes Brother   . Hypertension Daughter   . Hypertension Son   . Hypertension Daughter   . Cancer Grandchild        breast  . Diabetes Grandchild   . Breast cancer Neg Hx   . Colon cancer Neg Hx    Social History   Socioeconomic History  . Marital status: Widowed    Spouse name: Not on file  . Number of children: 3  . Years of education:  Not on file  . Highest education level: Not on file  Occupational History  . Not on file  Social Needs  . Financial resource strain: Not on file  . Food insecurity:    Worry: Not on file    Inability: Not on file  . Transportation needs:    Medical: Not on file    Non-medical: Not on file  Tobacco Use  . Smoking status: Never Smoker  . Smokeless tobacco: Never Used  Substance and Sexual Activity  . Alcohol use: No    Alcohol/week: 0.0 standard drinks  . Drug use: No  . Sexual activity: Not on file  Lifestyle  . Physical activity:    Days per week: Not on file    Minutes per session: Not on file  . Stress: Not on file  Relationships  . Social connections:    Talks on phone: Not on file    Gets together: Not on file    Attends religious service: Not on file    Active member of club or organization: Not on file    Attends meetings of clubs or organizations: Not on file    Relationship status: Not on file  Other Topics Concern  . Not on file  Social  History Narrative  . Not on file    Outpatient Encounter Medications as of 06/22/2018  Medication Sig  . amLODipine (NORVASC) 5 MG tablet TAKE 1 TABLET BY MOUTH TWO  TIMES DAILY  . Cholecalciferol (VITAMIN D) 2000 units tablet Take by mouth.  . clopidogrel (PLAVIX) 75 MG tablet TAKE 1 TABLET BY MOUTH  EVERY DAY  . gentamicin cream (GARAMYCIN) 0.1 % Apply 1 application topically 3 (three) times daily.  Marland Kitchen glucose blood (ONE TOUCH ULTRA TEST) test strip TEST TWO TIMES DAILY  . hydrocortisone (PROCTOCORT) 1 % CREA Apply thin layer around rectal opening 2 times per day to help hemorrhoids  . Lancet Devices (ONE TOUCH DELICA LANCING DEV) MISC Use twice daily Dx: 250.00  . metFORMIN (GLUCOPHAGE-XR) 500 MG 24 hr tablet Take 1 tablet (500 mg total) by mouth daily.  . Multiple Vitamin (MULTIVITAMIN) tablet Take 1 tablet by mouth daily.  . mupirocin cream (BACTROBAN) 2 % Apply topically.  . Omega-3 1000 MG CAPS Take by mouth.  .  Probiotic Product (PROBIOTIC DAILY PO) Take 1 tablet by mouth daily.  . sertraline (ZOLOFT) 25 MG tablet Take 1 tablet (25 mg total) by mouth daily.  . timolol (BETIMOL) 0.5 % ophthalmic solution 1 drop 2 (two) times daily.  . timolol (TIMOPTIC) 0.5 % ophthalmic solution   . [DISCONTINUED] metFORMIN (GLUCOPHAGE-XR) 500 MG 24 hr tablet TAKE 1 TABLET BY MOUTH TWO  TIMES DAILY BEFORE A MEAL.   No facility-administered encounter medications on file as of 06/22/2018.     Review of Systems  Constitutional: Negative for appetite change and unexpected weight change.  HENT: Negative for congestion and sinus pressure.   Respiratory: Negative for cough, chest tightness and shortness of breath.   Cardiovascular: Negative for chest pain, palpitations and leg swelling.  Gastrointestinal: Positive for diarrhea. Negative for abdominal pain, nausea and vomiting.  Genitourinary: Negative for difficulty urinating and dysuria.  Musculoskeletal: Negative for joint swelling and myalgias.  Skin: Negative for color change and rash.  Neurological: Negative for dizziness, light-headedness and headaches.  Psychiatric/Behavioral: Negative for agitation and dysphoric mood.       Increased stress as outlined.         Objective:    Physical Exam  Constitutional: She appears well-developed and well-nourished. No distress.  HENT:  Nose: Nose normal.  Mouth/Throat: Oropharynx is clear and moist.  Neck: Neck supple. No thyromegaly present.  Cardiovascular: Normal rate and regular rhythm.  Pulmonary/Chest: Breath sounds normal. No respiratory distress. She has no wheezes.  Abdominal: Soft. Bowel sounds are normal. There is no tenderness.  Musculoskeletal: She exhibits no edema or tenderness.  Lymphadenopathy:    She has no cervical adenopathy.  Skin: No rash noted. No erythema.  Psychiatric: She has a normal mood and affect. Her behavior is normal.    BP 132/78 (BP Location: Left Arm, Patient Position: Sitting,  Cuff Size: Normal)   Pulse 76   Temp 97.9 F (36.6 C) (Oral)   Resp 18   Wt 109 lb (49.4 kg)   LMP 07/21/1966   SpO2 97%   BMI 18.14 kg/m  Wt Readings from Last 3 Encounters:  06/22/18 109 lb (49.4 kg)  05/26/18 108 lb 12.8 oz (49.4 kg)  02/22/18 106 lb (48.1 kg)     Lab Results  Component Value Date   WBC 11.3 (H) 06/22/2018   HGB 14.9 06/22/2018   HCT 44.1 06/22/2018   PLT 152.0 06/22/2018   GLUCOSE 170 (H) 06/22/2018   CHOL 190  06/22/2018   TRIG 159.0 (H) 06/22/2018   HDL 56.70 06/22/2018   LDLDIRECT 66.4 05/24/2013   LDLCALC 101 (H) 06/22/2018   ALT 15 06/22/2018   AST 19 06/22/2018   NA 137 06/22/2018   K 4.0 06/22/2018   CL 100 06/22/2018   CREATININE 0.64 06/22/2018   BUN 18 06/22/2018   CO2 28 06/22/2018   TSH 2.55 06/22/2018   HGBA1C 7.1 (H) 06/22/2018   MICROALBUR 4.3 (H) 06/22/2018       Assessment & Plan:   Problem List Items Addressed This Visit    Anxiety    Increased stress and anxiety as outlined.  Discussed treatment.  Start zoloft 54m q day.  Follow.        Relevant Medications   sertraline (ZOLOFT) 25 MG tablet   Diabetes mellitus ([place]    Follow met b and a1c.  Last a1c 6.8.   Will decrease metformin to q day since sugars better.  Discussed that metformin can cause diarrhea, but her diarrhea is only intermittent.  Not sure metformin contributing.  Treat anxiety and stress as outlined.  Decrease metformin to daily.  Follow.        Relevant Medications   metFORMIN (GLUCOPHAGE-XR) 500 MG 24 hr tablet   Diarrhea    Persistent intermittent loose stool as outlined.  Feels is related to increased stress and anxiety.  Treat with zoloft as outlined.  Wants to hold on any further testing.  Decrease metformin.  Follow.        Essential (primary) hypertension    Blood pressure under good control.  Continue same medication regimen.  Follow pressures.  Follow metabolic panel.        Foot ulcer ([place]    Followed by podiatry.         Hypercholesterolemia    Follow lipid panel.        Thrombocytopenia ([place]    Follow cbc.            CEinar Pheasant MD

## 2018-06-22 NOTE — Assessment & Plan Note (Signed)
Blood pressure under good control.  Continue same medication regimen.  Follow pressures.  Follow metabolic panel.   

## 2018-06-22 NOTE — Assessment & Plan Note (Signed)
Persistent intermittent loose stool as outlined.  Feels is related to increased stress and anxiety.  Treat with zoloft as outlined.  Wants to hold on any further testing.  Decrease metformin.  Follow.

## 2018-06-22 NOTE — Patient Instructions (Signed)
Decrease metformin to once a day  Start zoloft (sertraline) 25mg  - one per day.

## 2018-06-23 ENCOUNTER — Other Ambulatory Visit: Payer: Self-pay | Admitting: Internal Medicine

## 2018-06-23 DIAGNOSIS — D72829 Elevated white blood cell count, unspecified: Secondary | ICD-10-CM

## 2018-06-23 NOTE — Progress Notes (Signed)
Order placed for f/u cbc.   

## 2018-07-01 ENCOUNTER — Ambulatory Visit: Payer: Medicare Other | Admitting: Podiatry

## 2018-07-05 ENCOUNTER — Ambulatory Visit: Payer: Medicare Other | Admitting: Podiatry

## 2018-07-05 ENCOUNTER — Encounter: Payer: Self-pay | Admitting: Podiatry

## 2018-07-05 DIAGNOSIS — L97522 Non-pressure chronic ulcer of other part of left foot with fat layer exposed: Secondary | ICD-10-CM

## 2018-07-05 DIAGNOSIS — E0843 Diabetes mellitus due to underlying condition with diabetic autonomic (poly)neuropathy: Secondary | ICD-10-CM

## 2018-07-05 DIAGNOSIS — I70245 Atherosclerosis of native arteries of left leg with ulceration of other part of foot: Secondary | ICD-10-CM

## 2018-07-05 NOTE — Progress Notes (Signed)
   Subjective:  84 year old female with PMHx of DM presenting today for follow up evaluation of an ulceration of the sub-first MPJ of the left foot. She states she is doing well. She denies any significant pain or modifying factors. She has been using the Gentamicin cream as directed and believes she needs a refill. Patient is here for further evaluation and treatment.   Past Medical History:  Diagnosis Date  . Allergy   . Chicken pox   . CVA (cerebral vascular accident) ([place])   . Diabetes mellitus ([place])   . Hypercholesterolemia   . Hypertension       Objective/Physical Exam General: The patient is alert and oriented x3 in no acute distress.  Dermatology:  Wound #1 noted to the sub-first MPJ of the left foot measuring 2.0 x 2.0 x 0.2 cm (LxWxD).   To the noted ulceration(s), there is no eschar. There is a moderate amount of slough, fibrin, and necrotic tissue noted. Granulation tissue and wound base is red. There is a minimal amount of serosanguineous drainage noted. There is no exposed bone muscle-tendon ligament or joint. There is no malodor. Periwound integrity is intact. Skin is warm, dry and supple bilateral lower extremities.  Vascular: Palpable pedal pulses bilaterally. No edema or erythema noted. Capillary refill within normal limits.  Neurological: Epicritic and protective threshold diminished bilaterally.   Musculoskeletal Exam: Range of motion within normal limits to all pedal and ankle joints bilateral. Muscle strength 5/5 in all groups bilateral.   Assessment: #1 ulceration of the sub-first MPJ of the left foot secondary to diabetes mellitus #2 diabetes mellitus w/ peripheral neuropathy   Plan of Care:  #1 Patient was evaluated. #2 medically necessary excisional debridement including subcutaneous tissue was performed using a tissue nipper and a chisel blade. Excisional debridement of all the necrotic nonviable tissue down to healthy bleeding viable tissue was  performed with post-debridement measurements same as pre-. #3 the wound was cleansed and dry sterile dressing applied. #4 Begin using collagen and dry sterile dressing three times weekly.  #5 Discussed surgery as an option to offload 1st MPJ and permanently alleviate symptoms. Daughter will discuss with family.  #6 Return to clinic in 3 weeks.     [name], DPM [facility]  Dr. [name], [address]                                        [place], [place] [address]                Office [phone]  Fax [phone]

## 2018-07-06 ENCOUNTER — Encounter: Payer: Self-pay | Admitting: Internal Medicine

## 2018-07-06 ENCOUNTER — Ambulatory Visit (INDEPENDENT_AMBULATORY_CARE_PROVIDER_SITE_OTHER): Payer: Medicare Other

## 2018-07-06 VITALS — BP 130/72 | HR 78 | Temp 98.5°F | Resp 16 | Ht 64.0 in | Wt 109.8 lb

## 2018-07-06 DIAGNOSIS — D72829 Elevated white blood cell count, unspecified: Secondary | ICD-10-CM | POA: Diagnosis not present

## 2018-07-06 DIAGNOSIS — H6121 Impacted cerumen, right ear: Secondary | ICD-10-CM | POA: Diagnosis not present

## 2018-07-06 DIAGNOSIS — Z0000 Encounter for general adult medical examination without abnormal findings: Secondary | ICD-10-CM | POA: Diagnosis not present

## 2018-07-06 LAB — CBC WITH DIFFERENTIAL/PLATELET
Basophils Absolute: 0.1 10*3/uL (ref 0.0–0.1)
Basophils Relative: 0.8 % (ref 0.0–3.0)
Eosinophils Absolute: 0.1 10*3/uL (ref 0.0–0.7)
Eosinophils Relative: 1.2 % (ref 0.0–5.0)
HCT: 43.4 % (ref 36.0–46.0)
Hemoglobin: 14.6 g/dL (ref 12.0–15.0)
Lymphocytes Relative: 18 % (ref 12.0–46.0)
Lymphs Abs: 1.5 10*3/uL (ref 0.7–4.0)
MCHC: 33.7 g/dL (ref 30.0–36.0)
MCV: 95.5 fl (ref 78.0–100.0)
Monocytes Absolute: 0.8 10*3/uL (ref 0.1–1.0)
Monocytes Relative: 9.3 % (ref 3.0–12.0)
Neutro Abs: 5.8 10*3/uL (ref 1.4–7.7)
Neutrophils Relative %: 70.7 % (ref 43.0–77.0)
Platelets: 160 10*3/uL (ref 150.0–400.0)
RBC: 4.54 Mil/uL (ref 3.87–5.11)
RDW: 12.8 % (ref 11.5–15.5)
WBC: 8.3 10*3/uL (ref 4.0–10.5)

## 2018-07-06 NOTE — Progress Notes (Signed)
Subjective:   MILEIGH TILLEY is a 84 y.o. female who presents for Medicare Annual (Subsequent) preventive examination.  Review of Systems:  No ROS.  Medicare Wellness Visit. Additional risk factors are reflected in the social history. Cardiac Risk Factors include: advanced age (>55men, >3 women);diabetes mellitus;hypertension     Objective:     Vitals: BP 130/72 (BP Location: Left Arm, Patient Position: Sitting, Cuff Size: Normal)   Pulse 78   Temp 98.5 F (36.9 C) (Oral)   Resp 16   Ht 5\' 4"  (1.626 m)   Wt 109 lb 12.8 oz (49.8 kg)   LMP 07/21/1966   SpO2 98%   BMI 18.85 kg/m   Body mass index is 18.85 kg/m.  Advanced Directives 07/06/2018 07/05/2017 11/12/2015  Does Patient Have a Medical Advance Directive? Yes Yes No  Type of Paramedic of [place];Living will [place];Living will -  Does patient want to make changes to medical advance directive? No - Patient declined No - Patient declined -  Copy of [place] in Chart? No - copy requested No - copy requested -    Tobacco Social History   Tobacco Use  Smoking Status Never Smoker  Smokeless Tobacco Never Used     Counseling given: Not Answered   Clinical Intake:  Pre-visit preparation completed: Yes  Pain : No/denies pain     Diabetes: No  How often do you need to have someone help you when you read instructions, pamphlets, or other written materials from your doctor or pharmacy?: 2 - Rarely  Interpreter Needed?: No     Past Medical History:  Diagnosis Date  . Allergy   . Chicken pox   . CVA (cerebral vascular accident) ([place])   . Diabetes mellitus ([place])   . Hypercholesterolemia   . Hypertension    Past Surgical History:  Procedure Laterality Date  . ABDOMINAL HYSTERECTOMY  1971   excessive bleeding  . APPENDECTOMY  age 58  . BREAST LUMPECTOMY  1958   benign  . DILATION AND CURETTAGE OF UTERUS  1970   Family History  Problem  Relation Age of Onset  . Liver cancer Father   . Stroke Mother   . Heart attack Brother   . Hypertension Daughter   . Hypertension Son   . Hypertension Daughter   . Cancer Grandchild        breast  . Diabetes Grandchild   . Breast cancer Neg Hx   . Colon cancer Neg Hx    Social History   Socioeconomic History  . Marital status: Widowed    Spouse name: Not on file  . Number of children: 3  . Years of education: Not on file  . Highest education level: Not on file  Occupational History  . Not on file  Social Needs  . Financial resource strain: Not hard at all  . Food insecurity:    Worry: Never true    Inability: Never true  . Transportation needs:    Medical: No    Non-medical: No  Tobacco Use  . Smoking status: Never Smoker  . Smokeless tobacco: Never Used  Substance and Sexual Activity  . Alcohol use: No    Alcohol/week: 0.0 standard drinks  . Drug use: No  . Sexual activity: Not on file  Lifestyle  . Physical activity:    Days per week: Not on file    Minutes per session: Not on file  . Stress: Not on  file  Relationships  . Social connections:    Talks on phone: Not on file    Gets together: Not on file    Attends religious service: Not on file    Active member of club or organization: Not on file    Attends meetings of clubs or organizations: Not on file    Relationship status: Not on file  Other Topics Concern  . Not on file  Social History Narrative  . Not on file    Outpatient Encounter Medications as of 07/06/2018  Medication Sig  . amLODipine (NORVASC) 5 MG tablet TAKE 1 TABLET BY MOUTH TWO  TIMES DAILY  . Cholecalciferol (VITAMIN D) 2000 units tablet Take by mouth.  . clopidogrel (PLAVIX) 75 MG tablet TAKE 1 TABLET BY MOUTH  EVERY DAY  . gentamicin cream (GARAMYCIN) 0.1 % Apply 1 application topically 3 (three) times daily.  Marland Kitchen glucose blood (ONE TOUCH ULTRA TEST) test strip TEST TWO TIMES DAILY  . hydrocortisone (PROCTOCORT) 1 % CREA Apply thin  layer around rectal opening 2 times per day to help hemorrhoids  . Lancet Devices (ONE TOUCH DELICA LANCING DEV) MISC Use twice daily Dx: 250.00  . metFORMIN (GLUCOPHAGE-XR) 500 MG 24 hr tablet Take 1 tablet (500 mg total) by mouth daily.  . Multiple Vitamin (MULTIVITAMIN) tablet Take 1 tablet by mouth daily.  . mupirocin cream (BACTROBAN) 2 % Apply topically.  . Omega-3 1000 MG CAPS Take by mouth.  . Probiotic Product (PROBIOTIC DAILY PO) Take 1 tablet by mouth daily.  . sertraline (ZOLOFT) 25 MG tablet Take 1 tablet (25 mg total) by mouth daily.  . timolol (BETIMOL) 0.5 % ophthalmic solution 1 drop 2 (two) times daily.  . timolol (TIMOPTIC) 0.5 % ophthalmic solution    No facility-administered encounter medications on file as of 07/06/2018.     Activities of Daily Living In your present state of health, do you have any difficulty performing the following activities: 07/06/2018  Hearing? Y  Comment Hearing aids, bilateral  Vision? N  Difficulty concentrating or making decisions? N  Comment Age appropriate  Walking or climbing stairs? Y  Comment Hx of ulcer on foot, followed by podiatry. Paces herself when walking.  Holds railing when available.   Dressing or bathing? N  Doing errands, shopping? Y  Comment She is limited to driving short distances  Conservation officer, nature and eating ? N  Using the Toilet? N  In the past six months, have you accidently leaked urine? N  Do you have problems with loss of bowel control? N  Managing your Medications? N  Managing your Finances? N  Comment Adult children assist as needed.   Housekeeping or managing your Housekeeping? N  Comment Adult children assist as needed.  Some recent data might be hidden    Patient Care Team: Einar Pheasant, MD as PCP - General (Internal Medicine)    Assessment:   This is a routine wellness examination for [place].  Presents with daughter, HIPAA compliant.  Information received from both.  Patient states her  medication zoloft 25mg  may need adjusting; physical reflexes and her thought process seem slower since starting new dose. Deferred to pcp; recommend taking zoloft every other day or breaking in half daily.  Patient opts to start taking every other day. Follow up with primary care provider if symptoms persist or worsen.   Notes more difficulty hearing while wearing her hearing aids.  States R ear generally produces more cerumen than L. Cleans at home once  a week with peroxide and vinegar. Deferred to pcp for same day follow up. Recommend lavage via nurse visit.   CBC recheck completed today.   Podiatry evaluation complete yesterday.  She is considering surgery to L foot after receiving the report.  Will monitor healing and discuss with her family.  See report in chart review.   Health Screenings  Mammogram-01/05/11 Colonoscopy-07/21/2006 Hearing-hearing aid, bilateral Hemoglobin A1C- 06/22/18 (7.1) Cholesterol-06/22/18 (120) Dental-dentures Vision-visits every 6 months Osteopenia of the elderly-Dexa scan discussed  Social  Alcohol intake-none Smoking history-none Smokers in home?none Illicit drug use?none Exercise-walking Diet-appetite is good. Healthy diet with vegetables.   Safety  Patient feels safe at home.  Patient does have smoke detectors at home  Patient does wear sunscreen or protective clothing when in direct sunlight  Patient does wear seat belt when driving or riding with others.   Activities of Daily Living Patient can do their own household chores. Denies needing assistance with: feeding themselves, getting from bed to chair, getting to the toilet, bathing/showering, dressing, managing money, climbing flight of stairs, or preparing meals.  Driving is limited to short distances. Daughters assist as needed.   Depression Screen Patient denies losing interest in daily life, feeling hopeless, or crying easily over simple problems.   Fall Screen Patient denies being afraid  of falling or falling in the last year.   Memory Screen Patient denies problems with memory, misplacing items, and is able to balance checkbook/bank accounts.  Patient is alert, normal appearance, oriented to person/place/and time. Correctly identified the president of the [place], recall of 2/3 objects, and performing simple calculations.  Patient displays appropriate judgement and can read correct time from watch face.   Immunizations The following Immunizations are up to date: Influenza, shingles and pneumonia. TDAP deferred per patient preference.   Other Providers Patient Care Team: Einar Pheasant, MD as PCP - General (Internal Medicine)  Exercise Activities and Dietary recommendations Current Exercise Habits: The patient does not participate in regular exercise at present;Home exercise routine, Type of exercise: walking  Goals    . Maintain Healthy Lifestyle     Healthy diet Stay hydrated Stay active        Fall Risk Fall Risk  07/06/2018 07/05/2017 03/02/2017 10/19/2016 07/11/2015  Falls in the past year? 0 Yes No No No  Number falls in past yr: - 1 - - -  Injury with Fall? - No - - -   Depression Screen PHQ 2/9 Scores 07/06/2018 07/05/2017 03/02/2017 10/19/2016  PHQ - 2 Score 0 0 0 0  PHQ- 9 Score - 0 - -     Cognitive Function     6CIT Screen 07/06/2018 07/05/2017  What Year? 0 points 0 points  What month? 0 points 0 points  What time? 0 points 0 points  Count back from 20 0 points 0 points  Months in reverse 0 points 0 points  Repeat phrase 0 points 0 points  Total Score 0 0    Immunization History  Administered Date(s) Administered  . DTaP 03/21/2012  . Influenza Split 03/21/2012, 04/03/2014  . Influenza, High Dose Seasonal PF 04/30/2017, 05/02/2018  . Influenza-Unspecified 04/09/2015, 04/14/2016, 04/28/2017  . Pneumococcal Conjugate-13 07/09/2014  . Pneumococcal Polysaccharide-23 07/09/2012    Screening Tests Health Maintenance  Topic Date Due  .  TETANUS/TDAP  03/06/1940  . DEXA SCAN  03/06/1988  . HEMOGLOBIN A1C  12/22/2018  . OPHTHALMOLOGY EXAM  12/22/2018  . URINE MICROALBUMIN  06/23/2019  . FOOT EXAM  07/07/2019  .  INFLUENZA VACCINE  Completed  . PNA vac Low Risk Adult  Completed  . MAMMOGRAM  Discontinued      Plan:    End of life planning; Advance aging; Advanced directives discussed. Copy of current HCPOA/Living Will requested.    I have personally reviewed and noted the following in the patient's chart:   . Medical and social history . Use of alcohol, tobacco or illicit drugs  . Current medications and supplements . Functional ability and status . Nutritional status . Physical activity . Advanced directives . List of other physicians . Hospitalizations, surgeries, and ER visits in previous 12 months . Vitals . Screenings to include cognitive, depression, and falls . Referrals and appointments  In addition, I have reviewed and discussed with patient certain preventive protocols, quality metrics, and best practice recommendations. A written personalized care plan for preventive services as well as general preventive health recommendations were provided to patient.     Varney Biles, LPN  [id number]   Reviewed above information.  Agree with assessment and plan.    Dr Nicki Reaper

## 2018-07-06 NOTE — Patient Instructions (Addendum)
  Angela Patterson , Thank you for taking time to come for your Medicare Wellness Visit. I appreciate your ongoing commitment to your health goals. Please review the following plan we discussed and let me know if I can assist you in the future.   Follow up as needed.    Bring a copy of your [place] and/or Living Will to be scanned into chart.  Have a great day and Merry Christmas!  These are the goals we discussed: Goals    . Maintain Healthy Lifestyle     Healthy diet Stay hydrated Stay active        This is a list of the screening recommended for you and due dates:  Health Maintenance  Topic Date Due  . Tetanus Vaccine  [birth year]06/1940  . DEXA scan (bone density measurement)  03/06/1988  . Hemoglobin A1C  12/22/2018  . Eye exam for diabetics  12/22/2018  . Urine Protein Check  06/23/2019  . Complete foot exam   07/07/2019  . Flu Shot  Completed  . Pneumonia vaccines  Completed  . Mammogram  Discontinued

## 2018-07-08 DIAGNOSIS — S61211A Laceration without foreign body of left index finger without damage to nail, initial encounter: Secondary | ICD-10-CM | POA: Diagnosis not present

## 2018-07-08 DIAGNOSIS — S8011XA Contusion of right lower leg, initial encounter: Secondary | ICD-10-CM | POA: Diagnosis not present

## 2018-07-08 DIAGNOSIS — S8012XA Contusion of left lower leg, initial encounter: Secondary | ICD-10-CM | POA: Diagnosis not present

## 2018-07-08 DIAGNOSIS — S61213A Laceration without foreign body of left middle finger without damage to nail, initial encounter: Secondary | ICD-10-CM | POA: Diagnosis not present

## 2018-07-08 NOTE — Progress Notes (Addendum)
Patient came in for right ear lavage. Ok to irrigate right ear per Dr. Nicki Reaper due to cerumen impaction. No complaints or concerns. Pt tolerated well. Cerumen removed.

## 2018-07-14 ENCOUNTER — Other Ambulatory Visit: Payer: Self-pay | Admitting: Internal Medicine

## 2018-07-29 ENCOUNTER — Ambulatory Visit (INDEPENDENT_AMBULATORY_CARE_PROVIDER_SITE_OTHER): Payer: Medicare Other | Admitting: Podiatry

## 2018-07-29 ENCOUNTER — Encounter: Payer: Self-pay | Admitting: Podiatry

## 2018-07-29 DIAGNOSIS — L97922 Non-pressure chronic ulcer of unspecified part of left lower leg with fat layer exposed: Secondary | ICD-10-CM | POA: Diagnosis not present

## 2018-07-29 DIAGNOSIS — L97522 Non-pressure chronic ulcer of other part of left foot with fat layer exposed: Secondary | ICD-10-CM

## 2018-07-29 DIAGNOSIS — E0843 Diabetes mellitus due to underlying condition with diabetic autonomic (poly)neuropathy: Secondary | ICD-10-CM | POA: Diagnosis not present

## 2018-07-29 MED ORDER — SULFAMETHOXAZOLE-TRIMETHOPRIM 800-160 MG PO TABS: 1.0000 | ORAL_TABLET | Freq: Two times a day (BID) | ORAL | 0 refills | Status: DC

## 2018-07-29 MED ORDER — GENTAMICIN SULFATE 0.1 % EX CREA: 1.0000 "application " | TOPICAL_CREAM | Freq: Three times a day (TID) | CUTANEOUS | 1 refills | Status: DC

## 2018-08-01 LAB — WOUND CULTURE

## 2018-08-01 LAB — SPECIMEN STATUS REPORT

## 2018-08-09 NOTE — Progress Notes (Signed)
Subjective:  85 year old female with PMHx of DM presenting today for follow up evaluation of an ulceration of the sub-first MPJ of the left foot. She states she is doing well. She denies any significant pain or modifying factors.  Patient has been soaking her foot daily.  She has been applying antibiotic cream with dressings every other day.  Patient's family believes that the wound has deteriorated and gotten worse since last visit on 07/05/2018. Patient also sustained a slip and fall injury a few days prior to presentation today.  She does have a right leg laceration secondary to the fall injury.  Dressings are applied on the laceration.  She has been applying the gentamicin cream and a Band-Aid.   Past Medical History:  Diagnosis Date  . Allergy   . Chicken pox   . CVA (cerebral vascular accident) ([place])   . Diabetes mellitus ([place])   . Hypercholesterolemia   . Hypertension       Objective/Physical Exam General: The patient is alert and oriented x3 in no acute distress.  Dermatology:  Wound #1 noted to the sub-first MPJ of the left foot measuring 4.0 x 4.0 x 0.3 cm (LxWxD).  Wound has significantly increased since last visit and deteriorated in nature.  Wound #2 is located to the right lower anterior leg measuring approximately 4.0 x 2.5 x 0.3 cm.  To the noted ulceration(s), there is no eschar. There is a moderate amount of slough, fibrin, and necrotic tissue noted. Granulation tissue and wound base is red. There is a minimal amount of serosanguineous drainage noted. There is no exposed bone muscle-tendon ligament or joint. There is no malodor. Periwound integrity is intact. Skin is warm, dry and supple bilateral lower extremities.  Vascular: Palpable pedal pulses bilaterally. No edema or erythema noted. Capillary refill within normal limits.  Neurological: Epicritic and protective threshold diminished bilaterally.   Musculoskeletal Exam: Range of motion within normal limits to  all pedal and ankle joints bilateral. Muscle strength 5/5 in all groups bilateral.   Assessment: #1 ulceration of the sub-first MPJ of the left foot secondary to diabetes mellitus #2  Ulcer right anterior leg secondary to mechanical fall injury  #3 diabetes mellitus w/ peripheral neuropathy   Plan of Care:  #1 Patient was evaluated. #2 medically necessary excisional debridement including muscle and deep fascial tissue was performed using a tissue nipper and a chisel blade. Excisional debridement of all the necrotic nonviable tissue down to healthy bleeding viable tissue was performed with post-debridement measurements same as pre-. #3 the wound was cleansed and dry sterile dressing applied. #4  Prescription for gentamicin cream.  Recommend gentamicin cream daily #5 prescription for Bactrim DS #20 twice daily #6 today cultures were taken of the ulcer to the sub-first MTPJ left foot #7 today we are getting an appointment with Liliane Channel for diabetic shoes and insoles to see if we can offload the pressure ulcer to the sub-first MPJ #8 we also discussed possible surgical intervention to offload pressure from this for some first MTPJ ulceration which would include Jones tenosuspension, possible sesamoidectomy, and IPJ arthrodesis   [name], DPM [facility]  Dr. [name], [address]                                        [place],  [address]  Office [phone]  Fax [phone]

## 2018-08-16 ENCOUNTER — Ambulatory Visit (INDEPENDENT_AMBULATORY_CARE_PROVIDER_SITE_OTHER): Payer: Medicare Other | Admitting: Podiatry

## 2018-08-16 ENCOUNTER — Encounter: Payer: Self-pay | Admitting: Podiatry

## 2018-08-16 DIAGNOSIS — L97522 Non-pressure chronic ulcer of other part of left foot with fat layer exposed: Secondary | ICD-10-CM | POA: Diagnosis not present

## 2018-08-16 DIAGNOSIS — E0843 Diabetes mellitus due to underlying condition with diabetic autonomic (poly)neuropathy: Secondary | ICD-10-CM

## 2018-08-16 DIAGNOSIS — I70245 Atherosclerosis of native arteries of left leg with ulceration of other part of foot: Secondary | ICD-10-CM

## 2018-08-16 DIAGNOSIS — L97922 Non-pressure chronic ulcer of unspecified part of left lower leg with fat layer exposed: Secondary | ICD-10-CM

## 2018-08-16 NOTE — Progress Notes (Signed)
   Subjective:  85 year old female with PMHx of DM presenting today for follow up evaluation of ulcerations of bilateral lower extremities. She states the wound on the left leg is doing much better but the right leg wound is red, sore and has purulent drainage. She has been applying Gentamicin cream as directed and finished the oral antibiotic 3-4 days ago. There are no modifying factors noted. Patient is here for further evaluation and treatment.   Past Medical History:  Diagnosis Date  . Allergy   . Chicken pox   . CVA (cerebral vascular accident) ([place])   . Diabetes mellitus ([place])   . Hypercholesterolemia   . Hypertension       Objective/Physical Exam General: The patient is alert and oriented x3 in no acute distress.  Dermatology:  Wound #1 noted to the sub-first MPJ of the left foot measuring 0.6 x 0.6 x 0.2 cm (LxWxD).  Wound has significantly increased since last visit and deteriorated in nature.  Wound #2 is located to the right lower anterior leg measuring approximately 3.5 x 1.5 x 0.2 cm.  To the noted ulceration(s), there is no eschar. There is a moderate amount of slough, fibrin, and necrotic tissue noted. Granulation tissue and wound base is red. There is a minimal amount of serosanguineous drainage noted. There is no exposed bone muscle-tendon ligament or joint. There is no malodor. Periwound integrity is intact. Skin is warm, dry and supple bilateral lower extremities.  Vascular: Palpable pedal pulses bilaterally. No edema or erythema noted. Capillary refill within normal limits.  Neurological: Epicritic and protective threshold diminished bilaterally.   Musculoskeletal Exam: Range of motion within normal limits to all pedal and ankle joints bilateral. Muscle strength 5/5 in all groups bilateral.   Assessment: #1 ulceration of the sub-first MPJ of the left foot secondary to diabetes mellitus #2 ulcer right anterior leg secondary to mechanical fall injury  #3 diabetes  mellitus w/ peripheral neuropathy   Plan of Care:  #1 Patient was evaluated. #2 Medically necessary excisional debridement including subcutaneous tissue was performed using a tissue nipper and a chisel blade. Excisional debridement of all the necrotic nonviable tissue down to healthy bleeding viable tissue was performed with post-debridement measurements same as pre-. #3 The wound was cleansed and dry sterile dressing applied. #4 Continue using Gentamicin cream daily with a dry sterile dressing.  #5 Appointment with Annell Greening, tomorrow 08/17/2018 for DM shoes. #6 Return to clinic in 3 weeks.    [name], DPM [facility]  Dr. [name], [address]                                        [place],  [address]                Office [phone]  Fax [phone]

## 2018-08-17 ENCOUNTER — Ambulatory Visit: Payer: Medicare Other | Admitting: Orthotics

## 2018-08-17 DIAGNOSIS — L97922 Non-pressure chronic ulcer of unspecified part of left lower leg with fat layer exposed: Secondary | ICD-10-CM

## 2018-08-17 DIAGNOSIS — I70245 Atherosclerosis of native arteries of left leg with ulceration of other part of foot: Secondary | ICD-10-CM

## 2018-08-17 NOTE — Progress Notes (Signed)

## 2018-08-23 ENCOUNTER — Ambulatory Visit (INDEPENDENT_AMBULATORY_CARE_PROVIDER_SITE_OTHER): Payer: Medicare Other

## 2018-08-23 ENCOUNTER — Encounter: Payer: Self-pay | Admitting: Internal Medicine

## 2018-08-23 ENCOUNTER — Ambulatory Visit (INDEPENDENT_AMBULATORY_CARE_PROVIDER_SITE_OTHER): Payer: Medicare Other | Admitting: Internal Medicine

## 2018-08-23 VITALS — BP 128/62 | HR 84 | Temp 97.7°F | Resp 16 | Wt 111.0 lb

## 2018-08-23 DIAGNOSIS — M25552 Pain in left hip: Secondary | ICD-10-CM | POA: Diagnosis not present

## 2018-08-23 DIAGNOSIS — F419 Anxiety disorder, unspecified: Secondary | ICD-10-CM

## 2018-08-23 DIAGNOSIS — E7800 Pure hypercholesterolemia, unspecified: Secondary | ICD-10-CM | POA: Diagnosis not present

## 2018-08-23 DIAGNOSIS — R197 Diarrhea, unspecified: Secondary | ICD-10-CM

## 2018-08-23 DIAGNOSIS — I10 Essential (primary) hypertension: Secondary | ICD-10-CM

## 2018-08-23 DIAGNOSIS — E11621 Type 2 diabetes mellitus with foot ulcer: Secondary | ICD-10-CM | POA: Diagnosis not present

## 2018-08-23 DIAGNOSIS — D696 Thrombocytopenia, unspecified: Secondary | ICD-10-CM | POA: Diagnosis not present

## 2018-08-23 DIAGNOSIS — L97529 Non-pressure chronic ulcer of other part of left foot with unspecified severity: Secondary | ICD-10-CM

## 2018-08-23 DIAGNOSIS — W19XXXA Unspecified fall, initial encounter: Secondary | ICD-10-CM

## 2018-08-23 DIAGNOSIS — L97509 Non-pressure chronic ulcer of other part of unspecified foot with unspecified severity: Secondary | ICD-10-CM

## 2018-08-23 DIAGNOSIS — M1612 Unilateral primary osteoarthritis, left hip: Secondary | ICD-10-CM | POA: Diagnosis not present

## 2018-08-23 MED ORDER — METFORMIN HCL ER 500 MG PO TB24: 500.0000 mg | ORAL_TABLET | Freq: Two times a day (BID) | ORAL | 1 refills | Status: DC

## 2018-08-23 NOTE — Patient Instructions (Signed)
Increase metformin to twice a day

## 2018-08-23 NOTE — Progress Notes (Signed)
Patient ID: ZIA NAJERA, female   DOB: 06/02/1921, 85 y.o.   MRN: [id number]   Subjective:    Patient ID: [name], female    DOB: 08/09/1920, 85 y.o.   MRN: [id number]  HPI  Patient here for a scheduled follow up.  She is accompanied by her son and daughter-n-law.   History obtained from all of them.  She fell 08/14/18.  Fell on her left hip.  Able to walk.  Noticed a little more discomfort and bruising 2-3 days ago.  No head injury.  No other injuries from the fall.  Discussed using support to help with walking.  She stays active.  No chest pain.  No sob. No acid reflux.  Taking zoloft 1/2 of 46m tablet.  Less anxiety.  Tolerating.  Diarrhea has resolved.   Previous fall 12.28/20.  Left hand laceration.  Took omnicef.  Healed.  No residual pain.  Discussed home health physical therapy to help with balance and gait.  They request handicap placard.     Past Medical History:  Diagnosis Date  . Allergy   . Chicken pox   . [place]   . [place]   . Hypercholesterolemia   . Hypertension    Past Surgical History:  Procedure Laterality Date  . ABDOMINAL HYSTERECTOMY  1971   excessive bleeding  . APPENDECTOMY  age 85 . BREAST LUMPECTOMY  1958   benign  . DILATION AND CURETTAGE OF UTERUS  1970   Family History  Problem Relation Age of Onset  . Liver cancer Father   . Stroke Mother   . Heart attack Brother   . Hypertension Daughter   . Hypertension Son   . Hypertension Daughter   . Cancer Grandchild        breast  . Diabetes Grandchild   . Breast cancer Neg Hx   . Colon cancer Neg Hx    Social History   Socioeconomic History  . Marital status: Widowed    Spouse name: Not on file  . Number of children: 3  . Years of education: Not on file  . Highest education level: Not on file  Occupational History  . Not on file  Social Needs  . Financial resource strain: Not hard at all  . Food insecurity:    Worry: Never true   Inability: Never true  . Transportation needs:    Medical: No    Non-medical: No  Tobacco Use  . Smoking status: Never Smoker  . Smokeless tobacco: Never Used  Substance and Sexual Activity  . Alcohol use: No    Alcohol/week: 0.0 standard drinks  . Drug use: No  . Sexual activity: Not on file  Lifestyle  . Physical activity:    Days per week: Not on file    Minutes per session: Not on file  . Stress: Not on file  Relationships  . Social connections:    Talks on phone: Not on file    Gets together: Not on file    Attends religious service: Not on file    Active member of club or organization: Not on file    Attends meetings of clubs or organizations: Not on file    Relationship status: Not on file  Other Topics Concern  . Not on file  Social History Narrative  . Not on file    Outpatient Encounter Medications as of 08/23/2018  Medication Sig  . amLODipine (NORVASC) 5 MG tablet TAKE  1 TABLET BY MOUTH TWO  TIMES DAILY  . Cholecalciferol (VITAMIN D) 2000 units tablet Take by mouth.  . clopidogrel (PLAVIX) 75 MG tablet TAKE 1 TABLET BY MOUTH  EVERY DAY  . gentamicin cream (GARAMYCIN) 0.1 % Apply 1 application topically 3 (three) times daily.  Marland Kitchen glucose blood (ONE TOUCH ULTRA TEST) test strip TEST TWO TIMES DAILY  . hydrocortisone (PROCTOCORT) 1 % CREA Apply thin layer around rectal opening 2 times per day to help hemorrhoids  . Lancet Devices (ONE TOUCH DELICA LANCING DEV) MISC Use twice daily Dx: 250.00  . metFORMIN (GLUCOPHAGE-XR) 500 MG 24 hr tablet Take 1 tablet (500 mg total) by mouth 2 (two) times daily with a meal.  . Multiple Vitamin (MULTIVITAMIN) tablet Take 1 tablet by mouth daily.  . mupirocin cream (BACTROBAN) 2 % Apply topically.  . Omega-3 1000 MG CAPS Take by mouth.  . Probiotic Product (PROBIOTIC DAILY PO) Take 1 tablet by mouth daily.  . sertraline (ZOLOFT) 25 MG tablet Take 1 tablet (25 mg total) by mouth daily.  Marland Kitchen sulfamethoxazole-trimethoprim (BACTRIM  DS,SEPTRA DS) 800-160 MG tablet Take 1 tablet by mouth 2 (two) times daily.  . timolol (BETIMOL) 0.5 % ophthalmic solution 1 drop 2 (two) times daily.  . timolol (TIMOPTIC) 0.5 % ophthalmic solution   . [DISCONTINUED] metFORMIN (GLUCOPHAGE-XR) 500 MG 24 hr tablet Take 1 tablet (500 mg total) by mouth daily.   No facility-administered encounter medications on file as of 08/23/2018.     Review of Systems  Constitutional: Negative for appetite change and unexpected weight change.  HENT: Negative for congestion and sinus pressure.   Respiratory: Negative for cough, chest tightness and shortness of breath.   Cardiovascular: Negative for chest pain, palpitations and leg swelling.  Gastrointestinal: Negative for abdominal pain, diarrhea, nausea and vomiting.  Genitourinary: Negative for difficulty urinating and dysuria.  Musculoskeletal: Negative for joint swelling and myalgias.       Left hip pain as outlined.    Skin: Negative for color change and rash.  Neurological: Negative for dizziness, light-headedness and headaches.  Psychiatric/Behavioral: Negative for agitation and dysphoric mood.       Objective:    Physical Exam Constitutional:      General: She is not in acute distress.    Appearance: Normal appearance.  HENT:     Nose: Nose normal. No congestion.     Mouth/Throat:     Pharynx: No oropharyngeal exudate or posterior oropharyngeal erythema.  Neck:     Musculoskeletal: Neck supple. No muscular tenderness.     Thyroid: No thyromegaly.  Cardiovascular:     Rate and Rhythm: Normal rate and regular rhythm.  Pulmonary:     Effort: No respiratory distress.     Breath sounds: Normal breath sounds. No wheezing.  Abdominal:     General: Bowel sounds are normal.     Palpations: Abdomen is soft.     Tenderness: There is no abdominal tenderness.  Musculoskeletal:        General: No swelling.     Comments: Able to stand and walk without significant pain.  No pain with abduction  and adduction.  Able to stand on one leg.  Good rom.  No pain with SLR.    Lymphadenopathy:     Cervical: No cervical adenopathy.  Skin:    Comments: Bruising over left lateral hip.  Starting to reabsorb.    Neurological:     Mental Status: She is alert.  Psychiatric:  Mood and Affect: Mood normal.        Behavior: Behavior normal.     BP 128/62 (BP Location: Left Arm, Patient Position: Sitting, Cuff Size: Normal)   Pulse 84   Temp 97.7 F (36.5 C) (Oral)   Resp 16   Wt 111 lb (50.3 kg)   LMP 07/21/1966   SpO2 98%   BMI 19.05 kg/m  Wt Readings from Last 3 Encounters:  08/23/18 111 lb (50.3 kg)  07/06/18 109 lb 12.8 oz (49.8 kg)  06/22/18 109 lb (49.4 kg)     Lab Results  Component Value Date   WBC 8.3 07/06/2018   HGB 14.6 07/06/2018   HCT 43.4 07/06/2018   PLT 160.0 07/06/2018   GLUCOSE 170 (H) 06/22/2018   CHOL 190 06/22/2018   TRIG 159.0 (H) 06/22/2018   HDL 56.70 06/22/2018   LDLDIRECT 66.4 05/24/2013   LDLCALC 101 (H) 06/22/2018   ALT 15 06/22/2018   AST 19 06/22/2018   NA 137 06/22/2018   K 4.0 06/22/2018   CL 100 06/22/2018   CREATININE 0.64 06/22/2018   BUN 18 06/22/2018   CO2 28 06/22/2018   TSH 2.55 06/22/2018   HGBA1C 7.1 (H) 06/22/2018   MICROALBUR 4.3 (H) 06/22/2018    Dg Foot Complete Left  Result Date: 07/30/2015 CLINICAL DATA:  Nonhealing wound. EXAM: LEFT FOOT - COMPLETE 3+ VIEW COMPARISON:  MRI 10/09/2014. FINDINGS: Diffuse osteopenia and degenerative change. No acute bony abnormality identified. A a wound with a bandage is noted over the plantar aspect of the distal foot. No adjacent acute bony abnormality. If osteomyelitis is of concern MRI can be obtained. IMPRESSION: Soft tissue wound with a bandage is noted over the plantar aspect of the distal left foot. Diffuse osteopenia degenerative change. No acute bony abnormality. Electronically Signed   By: Marcello Moores  Register   On: 07/30/2015 15:46       Assessment & Plan:   Problem  List Items Addressed This Visit    Anxiety    On zoloft and doing well.  Follow.        Diarrhea    Resolved with starting zoloft.        Falls    Recent falls.  Discussed physical therapy for evaluation and treatment - to help with gait and strengthening.        Foot ulcer ([place])    Followed by podiatry.        Hypercholesterolemia    Follow lipid panel.        Relevant Orders   Hepatic function panel   Lipid panel   Hypertension    Blood pressure under good control.  Continue same medication regimen.  Follow pressures.  Follow metabolic panel.        Relevant Orders   Basic metabolic panel   Thrombocytopenia (HCC)    Follow cbc.       Type 2 diabetes mellitus (HCC)    Discussed sugars.  Have increased.  Since diarrhea resolved after starting zoloft, will increase metformin to bid.  Follow bowels.  Follow met b and a1c.        Relevant Medications   metFORMIN (GLUCOPHAGE-XR) 500 MG 24 hr tablet   Other Relevant Orders   Hemoglobin A1c    Other Visit Diagnoses    Left hip pain    -  Primary   Some pain and bruising s/p fall.  Will xray.  Able to bear weight, abduct and adduct left leg.  Discussed PT.  Relevant Orders   DG HIP UNILAT WITH PELVIS 2-3 VIEWS LEFT (Completed)       Einar Pheasant, MD

## 2018-08-24 ENCOUNTER — Telehealth: Payer: Self-pay | Admitting: *Deleted

## 2018-08-24 NOTE — Telephone Encounter (Signed)
Copied from [place] [id number]. Topic: General - Other >> Aug 24, 2018  3:30 PM Leward Quan A wrote: Reason for CRM: Patient daughter Janalyn Shy called to request the results of moms Xrays taken on 08/23/2018. Please call back at Ph# [phone]

## 2018-08-25 NOTE — Telephone Encounter (Signed)
Results already given to daughter

## 2018-08-28 ENCOUNTER — Encounter: Payer: Self-pay | Admitting: Internal Medicine

## 2018-08-28 DIAGNOSIS — W19XXXA Unspecified fall, initial encounter: Secondary | ICD-10-CM | POA: Insufficient documentation

## 2018-08-28 DIAGNOSIS — Y92009 Unspecified place in unspecified non-institutional (private) residence as the place of occurrence of the external cause: Secondary | ICD-10-CM | POA: Insufficient documentation

## 2018-08-28 NOTE — Assessment & Plan Note (Signed)
Blood pressure under good control.  Continue same medication regimen.  Follow pressures.  Follow metabolic panel.   

## 2018-08-28 NOTE — Assessment & Plan Note (Signed)
On zoloft and doing well.  Follow.   

## 2018-08-28 NOTE — Assessment & Plan Note (Signed)
Followed by podiatry 

## 2018-08-28 NOTE — Assessment & Plan Note (Signed)
Follow cbc.  

## 2018-08-28 NOTE — Assessment & Plan Note (Signed)
Follow lipid panel.   

## 2018-08-28 NOTE — Assessment & Plan Note (Signed)
Recent falls.  Discussed physical therapy for evaluation and treatment - to help with gait and strengthening.

## 2018-08-28 NOTE — Assessment & Plan Note (Signed)
Resolved with starting zoloft.

## 2018-08-28 NOTE — Assessment & Plan Note (Addendum)
Discussed sugars.  Have increased.  Since diarrhea resolved after starting zoloft, will increase metformin to bid.  Follow bowels.  Follow met b and a1c.

## 2018-09-06 ENCOUNTER — Ambulatory Visit (INDEPENDENT_AMBULATORY_CARE_PROVIDER_SITE_OTHER): Payer: Medicare Other | Admitting: Podiatry

## 2018-09-06 ENCOUNTER — Encounter: Payer: Self-pay | Admitting: Podiatry

## 2018-09-06 DIAGNOSIS — E0843 Diabetes mellitus due to underlying condition with diabetic autonomic (poly)neuropathy: Secondary | ICD-10-CM | POA: Diagnosis not present

## 2018-09-06 DIAGNOSIS — L97922 Non-pressure chronic ulcer of unspecified part of left lower leg with fat layer exposed: Secondary | ICD-10-CM

## 2018-09-06 DIAGNOSIS — I70245 Atherosclerosis of native arteries of left leg with ulceration of other part of foot: Secondary | ICD-10-CM

## 2018-09-06 DIAGNOSIS — L97522 Non-pressure chronic ulcer of other part of left foot with fat layer exposed: Secondary | ICD-10-CM | POA: Diagnosis not present

## 2018-09-08 NOTE — Progress Notes (Signed)
   Subjective:  85 year old female with PMHx of DM presenting today for follow up evaluation of ulcerations of bilateral lower extremities. She states the wounds are about the same. She expresses concern about a possible new lesion on the right lower extremity. She has been applying Gentamicin cream to the areas for treatment. There are no modifying factors noted. Patient is here for further evaluation and treatment.   Past Medical History:  Diagnosis Date  . Allergy   . Chicken pox   . CVA (cerebral vascular accident) ([place])   . Diabetes mellitus ([place])   . Hypercholesterolemia   . Hypertension       Objective/Physical Exam General: The patient is alert and oriented x3 in no acute distress.  Dermatology:  Wound #1 noted to the sub-first MPJ of the left foot measuring 2.5 x 3.5 x 0.3 cm (LxWxD).  Wound has significantly increased since last visit and deteriorated in nature.  Wound #2 is located to the right lower anterior leg measuring approximately 2.0 x 1.5 x 0.1 cm.  To the noted ulceration(s), there is no eschar. There is a moderate amount of slough, fibrin, and necrotic tissue noted. Granulation tissue and wound base is red. There is a minimal amount of serosanguineous drainage noted. There is no exposed bone muscle-tendon ligament or joint. There is no malodor. Periwound integrity is intact. Skin is warm, dry and supple bilateral lower extremities.  Vascular: Palpable pedal pulses bilaterally. No edema or erythema noted. Capillary refill within normal limits.  Neurological: Epicritic and protective threshold diminished bilaterally.   Musculoskeletal Exam: Range of motion within normal limits to all pedal and ankle joints bilateral. Muscle strength 5/5 in all groups bilateral.   Assessment: #1 ulceration of the sub-first MPJ of the left foot secondary to diabetes mellitus #2 ulcer right anterior leg secondary to mechanical fall injury  #3 diabetes mellitus w/ peripheral  neuropathy   Plan of Care:  #1 Patient was evaluated. #2 Medically necessary excisional debridement including subcutaneous tissue was performed using a tissue nipper and a chisel blade. Excisional debridement of all the necrotic nonviable tissue down to healthy bleeding viable tissue was performed with post-debridement measurements same as pre-. #3 The wound was cleansed and dry sterile dressing applied. #4 Continue using Gentamicin cream daily with a dry sterile dressing and offloading donut pads daily.  #5 Patient fitted for DM shoes and insoles.  #6 Return to clinic in 3 weeks.    [name], DPM [facility]  Dr. [name], [address]                                        [place], [place] [address]                Office [phone]  Fax [phone]

## 2018-09-12 ENCOUNTER — Encounter: Payer: Self-pay | Admitting: Internal Medicine

## 2018-09-13 ENCOUNTER — Encounter: Payer: Self-pay | Admitting: Internal Medicine

## 2018-09-13 NOTE — Telephone Encounter (Signed)
See other note

## 2018-09-13 NOTE — Telephone Encounter (Signed)
I have typed the letter. Not sure if printed.  Per note, wants to pick up Friday.

## 2018-09-14 ENCOUNTER — Other Ambulatory Visit: Payer: Self-pay | Admitting: Internal Medicine

## 2018-09-27 ENCOUNTER — Ambulatory Visit (INDEPENDENT_AMBULATORY_CARE_PROVIDER_SITE_OTHER): Payer: Medicare Other | Admitting: Podiatry

## 2018-09-27 ENCOUNTER — Encounter: Payer: Self-pay | Admitting: Podiatry

## 2018-09-27 DIAGNOSIS — I70245 Atherosclerosis of native arteries of left leg with ulceration of other part of foot: Secondary | ICD-10-CM | POA: Diagnosis not present

## 2018-09-27 DIAGNOSIS — L97522 Non-pressure chronic ulcer of other part of left foot with fat layer exposed: Secondary | ICD-10-CM

## 2018-09-27 DIAGNOSIS — E0843 Diabetes mellitus due to underlying condition with diabetic autonomic (poly)neuropathy: Secondary | ICD-10-CM

## 2018-09-29 NOTE — Progress Notes (Signed)
   Subjective:  85 year old female with PMHx of DM presenting today for follow up evaluation of ulcerations of bilateral lower extremities. She states she is doing well. She reports a significant decrease in size of the wounds. She has been using Gentamicin cream as directed. She denies any worsening factors. Patient is here for further evaluation and treatment.   Past Medical History:  Diagnosis Date  . Allergy   . Chicken pox   . CVA (cerebral vascular accident) ([place])   . Diabetes mellitus ([place])   . Hypercholesterolemia   . Hypertension       Objective/Physical Exam General: The patient is alert and oriented x3 in no acute distress.  Dermatology:  Wound #1 noted to the sub-first MPJ of the left foot measuring 0.6 x 0.2 x 0.1 cm (LxWxD).    To the noted ulceration(s), there is no eschar. There is a moderate amount of slough, fibrin, and necrotic tissue noted. Granulation tissue and wound base is red. There is a minimal amount of serosanguineous drainage noted. There is no exposed bone muscle-tendon ligament or joint. There is no malodor. Periwound integrity is intact. Skin is warm, dry and supple bilateral lower extremities.  Wound noted to the right anterior leg has healed. Complete re-epithelialization has occurred. No drainage noted.   Vascular: Palpable pedal pulses bilaterally. No edema or erythema noted. Capillary refill within normal limits.  Neurological: Epicritic and protective threshold diminished bilaterally.   Musculoskeletal Exam: Range of motion within normal limits to all pedal and ankle joints bilateral. Muscle strength 5/5 in all groups bilateral.   Assessment: #1 ulceration of the sub-first MPJ of the left foot secondary to diabetes mellitus #2 ulceration of the right anterior leg secondary to mechanical fall injury - resolved  #3 diabetes mellitus w/ peripheral neuropathy   Plan of Care:  #1 Patient was evaluated. #2 Medically necessary excisional  debridement including subcutaneous tissue was performed using a tissue nipper and a chisel blade. Excisional debridement of all the necrotic nonviable tissue down to healthy bleeding viable tissue was performed with post-debridement measurements same as pre-. #3 The wound was cleansed and dry sterile dressing applied. #4 Continue dressing changes every other day.  #5 Diabetic shoes dispensed today.  #6 Return to clinic in 3 weeks.     [name], DPM [facility]  Dr. [name], [address]                                        [place], [place] [address]                Office [phone]  Fax [phone]

## 2018-10-18 ENCOUNTER — Ambulatory Visit: Payer: Medicare Other | Admitting: Podiatry

## 2018-10-25 ENCOUNTER — Ambulatory Visit (INDEPENDENT_AMBULATORY_CARE_PROVIDER_SITE_OTHER): Payer: Medicare Other | Admitting: Podiatry

## 2018-10-25 ENCOUNTER — Encounter: Payer: Self-pay | Admitting: Podiatry

## 2018-10-25 DIAGNOSIS — L02612 Cutaneous abscess of left foot: Secondary | ICD-10-CM

## 2018-10-25 DIAGNOSIS — L97522 Non-pressure chronic ulcer of other part of left foot with fat layer exposed: Secondary | ICD-10-CM

## 2018-10-25 DIAGNOSIS — E0843 Diabetes mellitus due to underlying condition with diabetic autonomic (poly)neuropathy: Secondary | ICD-10-CM

## 2018-10-25 DIAGNOSIS — I70245 Atherosclerosis of native arteries of left leg with ulceration of other part of foot: Secondary | ICD-10-CM

## 2018-10-27 LAB — WOUND CULTURE: Organism ID, Bacteria: NONE SEEN

## 2018-10-31 NOTE — Progress Notes (Signed)
   Subjective:  85 year old female with PMHx of DM presenting today for follow up evaluation of an ulcer to the sub-first MPJ of the left foot.  Patient states that over the last few days she developed a blood blister to the area and it popped with drainage currently.  She has been wearing her new diabetic shoes with MPJ offloading insoles.  She presents for further treatment evaluation   Past Medical History:  Diagnosis Date  . Allergy   . Chicken pox   . CVA (cerebral vascular accident) ([place])   . Diabetes mellitus ([place])   . Hypercholesterolemia   . Hypertension       Objective/Physical Exam General: The patient is alert and oriented x3 in no acute distress.  Dermatology:  Wound #1 noted to the sub-first MPJ of the left foot measuring 3.03.0 x 0.3 cm (LxWxD).    To the noted ulceration(s), there is no eschar. There is a moderate amount of slough, fibrin, and necrotic tissue noted. Granulation tissue and wound base is red. There is a minimal amount of serosanguineous drainage noted. There is no exposed bone muscle-tendon ligament or joint. There is no malodor. Periwound integrity is intact. Skin is warm, dry and supple bilateral lower extremities.  Wound noted to the right anterior leg has healed. Complete re-epithelialization has occurred. No drainage noted.   Vascular: Palpable pedal pulses bilaterally. No edema or erythema noted. Capillary refill within normal limits.  Neurological: Epicritic and protective threshold diminished bilaterally.   Musculoskeletal Exam: Range of motion within normal limits to all pedal and ankle joints bilateral. Muscle strength 5/5 in all groups bilateral.   Assessment: #1 ulceration of the sub-first MPJ of the left foot secondary to diabetes mellitus #2 diabetes mellitus w/ peripheral neuropathy   Plan of Care:  #1 Patient was evaluated. #2 Medically necessary excisional debridement including muscle and deep fascial tissue was performed using  a tissue nipper and a chisel blade. Excisional debridement of all the necrotic nonviable tissue down to healthy bleeding viable tissue was performed with post-debridement measurements same as pre-. #3 The wound was cleansed and dry sterile dressing applied. #4 Continue dressing changes every other day.  #5 Diabetic shoes dispensed today.  #6  Today cultures were taken and sent to pathology  #7 return to clinic in 1 week  [name], DPM [facility]  Dr. [name], [address]                                        [place], [place] [address]                Office [phone]  Fax [phone]

## 2018-11-03 ENCOUNTER — Other Ambulatory Visit (INDEPENDENT_AMBULATORY_CARE_PROVIDER_SITE_OTHER): Payer: Medicare Other

## 2018-11-03 ENCOUNTER — Other Ambulatory Visit: Payer: Self-pay

## 2018-11-03 DIAGNOSIS — L97509 Non-pressure chronic ulcer of other part of unspecified foot with unspecified severity: Secondary | ICD-10-CM | POA: Diagnosis not present

## 2018-11-03 DIAGNOSIS — E11621 Type 2 diabetes mellitus with foot ulcer: Secondary | ICD-10-CM

## 2018-11-03 DIAGNOSIS — I10 Essential (primary) hypertension: Secondary | ICD-10-CM

## 2018-11-03 DIAGNOSIS — E7800 Pure hypercholesterolemia, unspecified: Secondary | ICD-10-CM | POA: Diagnosis not present

## 2018-11-03 LAB — BASIC METABOLIC PANEL
BUN: 16 mg/dL (ref 6–23)
CO2: 28 mEq/L (ref 19–32)
Calcium: 10 mg/dL (ref 8.4–10.5)
Chloride: 97 mEq/L (ref 96–112)
Creatinine, Ser: 0.64 mg/dL (ref 0.40–1.20)
GFR: 86.1 mL/min (ref >60.00)
Glucose, Bld: 226 mg/dL — ABNORMAL HIGH (ref 70–99)
Potassium: 4.7 mEq/L (ref 3.5–5.1)
Sodium: 136 mEq/L (ref 135–145)

## 2018-11-03 LAB — HEPATIC FUNCTION PANEL
ALT: 15 U/L (ref 0–35)
AST: 18 U/L (ref 0–37)
Albumin: 4.4 g/dL (ref 3.5–5.2)
Alkaline Phosphatase: 74 U/L (ref 39–117)
Bilirubin, Direct: 0.1 mg/dL (ref 0.0–0.3)
Total Bilirubin: 0.6 mg/dL (ref 0.2–1.2)
Total Protein: 8.2 g/dL (ref 6.0–8.3)

## 2018-11-03 LAB — LIPID PANEL
Cholesterol: 199 mg/dL (ref 0–200)
HDL: 47.2 mg/dL (ref >39.00)
NonHDL: 151.86
Total CHOL/HDL Ratio: 4
Triglycerides: 207 mg/dL — ABNORMAL HIGH (ref 0.0–149.0)
VLDL: 41.4 mg/dL — ABNORMAL HIGH (ref 0.0–40.0)

## 2018-11-03 LAB — LDL CHOLESTEROL, DIRECT: Direct LDL: 119 mg/dL

## 2018-11-03 LAB — HEMOGLOBIN A1C: Hgb A1c MFr Bld: 8.3 % — ABNORMAL HIGH (ref 4.6–6.5)

## 2018-11-08 ENCOUNTER — Ambulatory Visit: Payer: PRIVATE HEALTH INSURANCE | Admitting: Internal Medicine

## 2018-11-08 ENCOUNTER — Encounter: Payer: Self-pay | Admitting: Podiatry

## 2018-11-08 ENCOUNTER — Other Ambulatory Visit: Payer: Self-pay

## 2018-11-08 ENCOUNTER — Ambulatory Visit (INDEPENDENT_AMBULATORY_CARE_PROVIDER_SITE_OTHER): Payer: Medicare Other | Admitting: Podiatry

## 2018-11-08 VITALS — Temp 99.0°F

## 2018-11-08 DIAGNOSIS — L97522 Non-pressure chronic ulcer of other part of left foot with fat layer exposed: Secondary | ICD-10-CM | POA: Diagnosis not present

## 2018-11-08 DIAGNOSIS — E0843 Diabetes mellitus due to underlying condition with diabetic autonomic (poly)neuropathy: Secondary | ICD-10-CM

## 2018-11-08 NOTE — Progress Notes (Signed)
   Subjective:  85 year old female with PMHx of DM presenting today with her son for follow up evaluation of an ulcer to the sub-first MPJ of the left foot.  Patient and her son both state that over the past 2 weeks the wound has improved significantly.  Drainage has decreased significantly.  Patient continues to deny pain.  She has been wearing a postoperative surgical shoe over the last 2 weeks.  No new complaints at this time  Past Medical History:  Diagnosis Date  . Allergy   . Chicken pox   . CVA (cerebral vascular accident) ([place])   . Diabetes mellitus ([place])   . Hypercholesterolemia   . Hypertension       Objective/Physical Exam General: The patient is alert and oriented x3 in no acute distress.  Dermatology:  Wound #1 noted to the sub-first MPJ of the left foot measuring 2.52.5 x 0.3 cm (LxWxD).    To the noted ulceration(s), there is no eschar. There is a moderate amount of slough, fibrin, and necrotic tissue noted. Granulation tissue and wound base is red. There is a minimal amount of serosanguineous drainage noted. There is no exposed bone muscle-tendon ligament or joint. There is no malodor. Periwound integrity is intact. Skin is warm, dry and supple bilateral lower extremities.  Vascular: Palpable pedal pulses bilaterally. No edema or erythema noted. Capillary refill within normal limits.  Neurological: Epicritic and protective threshold diminished bilaterally.   Musculoskeletal Exam: Range of motion within normal limits to all pedal and ankle joints bilateral. Muscle strength 5/5 in all groups bilateral.   Assessment: #1 ulceration of the sub-first MPJ of the left foot secondary to diabetes mellitus #2 diabetes mellitus w/ peripheral neuropathy   Plan of Care:  #1 Patient was evaluated. #2 Medically necessary excisional debridement including muscle and deep fascial tissue was performed using a tissue nipper and a chisel blade. Excisional debridement of all the  necrotic nonviable tissue down to healthy bleeding viable tissue was performed with post-debridement measurements same as pre-. #3 The wound was cleansed and dry sterile dressing applied. Keep CDI x 1 week #4 Continue dressing changes every other day after 1 week, consisting of betadine and DSD.  #5 Continue DM shoes and insoles. #6 return to clinic in 1 week  [name], DPM [facility]  Dr. [name], [address]                                        [place], [place] [address]                Office [phone]  Fax [phone]

## 2018-11-10 ENCOUNTER — Encounter: Payer: Self-pay | Admitting: Internal Medicine

## 2018-11-10 ENCOUNTER — Ambulatory Visit (INDEPENDENT_AMBULATORY_CARE_PROVIDER_SITE_OTHER): Payer: Medicare Other | Admitting: Internal Medicine

## 2018-11-10 ENCOUNTER — Other Ambulatory Visit: Payer: Self-pay

## 2018-11-10 DIAGNOSIS — R197 Diarrhea, unspecified: Secondary | ICD-10-CM | POA: Diagnosis not present

## 2018-11-10 DIAGNOSIS — L97529 Non-pressure chronic ulcer of other part of left foot with unspecified severity: Secondary | ICD-10-CM | POA: Diagnosis not present

## 2018-11-10 DIAGNOSIS — E7800 Pure hypercholesterolemia, unspecified: Secondary | ICD-10-CM | POA: Diagnosis not present

## 2018-11-10 DIAGNOSIS — F419 Anxiety disorder, unspecified: Secondary | ICD-10-CM | POA: Diagnosis not present

## 2018-11-10 DIAGNOSIS — E1140 Type 2 diabetes mellitus with diabetic neuropathy, unspecified: Secondary | ICD-10-CM

## 2018-11-10 DIAGNOSIS — I10 Essential (primary) hypertension: Secondary | ICD-10-CM | POA: Diagnosis not present

## 2018-11-10 DIAGNOSIS — R269 Unspecified abnormalities of gait and mobility: Secondary | ICD-10-CM

## 2018-11-10 NOTE — Progress Notes (Addendum)
Patient ID: Angela Patterson, female   DOB: 08-21-1920, 85 y.o.   MRN: [id number] Virtual Visit via telephone Note  This visit type was conducted due to national recommendations for restrictions regarding the COVID-19 pandemic (e.g. social distancing).  This format is felt to be most appropriate for this patient at this time.  All issues noted in this document were discussed and addressed.  No physical exam was performed (except for noted visual exam findings with Video Visits).   I connected with Angela Patterson by telephone and verified that I am speaking with the correct person using two identifiers. Location patient: home Location provider: work Persons participating in the virtual visit: patient, provider and pts two daughters (Dot and [name]).    I discussed the limitations, risks, security and privacy concerns of performing an evaluation and management service by telephone and the availability of in person appointments.  The patient expressed understanding and agreed to proceed.   Reason for visit: scheduled follow up.   HPI: She is accompanied by her two daughters (Dot and [name]).  History obtained from all of them.  She is seeing podiatry.  Following foot ulcer.  Has regular f/u planned.  Wearing post op shoe/boot.  They had questions about home health.  Want to hold off at this time until the COVID restrictions are lifted.  They were questioning help with bandage changes.  Also with unsteady gait at times.  Discussed home PT to help with balance and gait.  Will notify me when agreeable for home health evaluation.  Tries to stay active.  Eating.  No nausea or vomiting.  Still with increased anxiety.  Do feel zoloft has helped.  Diarrhea is better.  No chest pain.  No sob.  No acid reflux reported. No abdominal pain.  Sugars relatively stable per her checks.  Discussed labs.  a1c increased.  Given 95. No changes made in medication - given a1c 8.3.      ROS: See pertinent positives and negatives  per HPI.  Past Medical History:  Diagnosis Date  . Allergy   . Chicken pox   . CVA (cerebral vascular accident) ([place])   . Diabetes mellitus ([place])   . Hypercholesterolemia   . Hypertension     Past Surgical History:  Procedure Laterality Date  . ABDOMINAL HYSTERECTOMY  1971   excessive bleeding  . APPENDECTOMY  age 33  . BREAST LUMPECTOMY  1958   benign  . DILATION AND CURETTAGE OF UTERUS  1970    Family History  Problem Relation Age of Onset  . Liver cancer Father   . Stroke Mother   . Heart attack Brother   . Hypertension Daughter   . Hypertension Son   . Hypertension Daughter   . Cancer Grandchild        breast  . Diabetes Grandchild   . Breast cancer Neg Hx   . Colon cancer Neg Hx     SOCIAL HX: reviewed.    Current Outpatient Medications:  .  amLODipine (NORVASC) 5 MG tablet, TAKE 1 TABLET BY MOUTH TWO  TIMES DAILY, Disp: 180 tablet, Rfl: 1 .  Cholecalciferol (VITAMIN D) 2000 units tablet, Take by mouth., Disp: , Rfl:  .  clopidogrel (PLAVIX) 75 MG tablet, TAKE 1 TABLET BY MOUTH  EVERY DAY, Disp: 90 tablet, Rfl: 1 .  gentamicin cream (GARAMYCIN) 0.1 %, Apply 1 application topically 3 (three) times daily., Disp: 30 g, Rfl: 1 .  glucose blood test strip, USE TO CHECK BLOOD  SUGAR  TWO TIMES DAILY, Disp: 200 each, Rfl: 1 .  hydrocortisone (PROCTOCORT) 1 % CREA, Apply thin layer around rectal opening 2 times per day to help hemorrhoids, Disp: 1 Tube, Rfl: 1 .  Lancet Devices (ONE TOUCH DELICA LANCING DEV) MISC, Use twice daily Dx: 250.00, Disp: 100 each, Rfl: 5 .  metFORMIN (GLUCOPHAGE-XR) 500 MG 24 hr tablet, Take 1 tablet (500 mg total) by mouth 2 (two) times daily with a meal., Disp: 180 tablet, Rfl: 1 .  Multiple Vitamin (MULTIVITAMIN) tablet, Take 1 tablet by mouth daily., Disp: , Rfl:  .  mupirocin cream (BACTROBAN) 2 %, Apply topically., Disp: , Rfl:  .  Omega-3 1000 MG CAPS, Take by mouth., Disp: , Rfl:  .  Probiotic Product (PROBIOTIC DAILY PO), Take 1  tablet by mouth daily., Disp: , Rfl:  .  sertraline (ZOLOFT) 25 MG tablet, Take 1 tablet (25 mg total) by mouth daily., Disp: 30 tablet, Rfl: 2 .  timolol (BETIMOL) 0.5 % ophthalmic solution, 1 drop 2 (two) times daily., Disp: , Rfl:  .  timolol (TIMOPTIC) 0.5 % ophthalmic solution, , Disp: , Rfl:   EXAM:  GENERAL: alert.  Answering questions appropriately.  Sounds to be in no acute distress.    PSYCH/NEURO: pleasant and cooperative, no obvious depression or anxiety, speech and thought processing grossly intact  ASSESSMENT AND PLAN:  Discussed the following assessment and plan:  Anxiety  Type 2 diabetes mellitus with diabetic neuropathy, without long-term current use of insulin (HCC)  Diarrhea, unspecified type  Essential (primary) hypertension  Ulcer of left foot, unspecified ulcer stage (HCC)  Hypercholesterolemia  Gait abnormality  Anxiety On zoloft and doing better per report.  Follow.    Diabetes mellitus On lower dose metformin.  a1c increased from last check.  a1c 8.3.  Given 95, hold on making changes in medication.  Follow met b and a1c.    Diarrhea Reports improved.  On zoloft.  Follow.    Essential (primary) hypertension Blood pressure has been doing well.  Same medication.  Follow pressures.  Follow metabolic panel.   Foot ulcer ([place]) Followed by podiatry.  Seeing regularly. Notes reviewed.   Hypercholesterolemia Follow lipid panel.   Gait abnormality Some issues with gait and balance.  No recent falls - since last visit.  Have discussed importance of slow position changes and movements.  Discussed home health as outlined above.  Wants to hold at this point.  Will notify me when agreeable.  Wants to hold given COVID concernes.      I discussed the assessment and treatment plan with the patient. The patient was provided an opportunity to ask questions and all were answered. The patient agreed with the plan and demonstrated an understanding of the  instructions.   The patient was advised to call back or seek an in-person evaluation if the symptoms worsen or if the condition fails to improve as anticipated.  I provided 25 minutes of non-face-to-face time during this encounter.   Einar Pheasant, MD

## 2018-11-13 ENCOUNTER — Encounter: Payer: Self-pay | Admitting: Internal Medicine

## 2018-11-13 DIAGNOSIS — R269 Unspecified abnormalities of gait and mobility: Secondary | ICD-10-CM | POA: Insufficient documentation

## 2018-11-13 NOTE — Assessment & Plan Note (Signed)
Reports improved.  On zoloft.  Follow.

## 2018-11-13 NOTE — Assessment & Plan Note (Signed)
On lower dose metformin.  a1c increased from last check.  a1c 8.3.  Given 95, hold on making changes in medication.  Follow met b and a1c.

## 2018-11-13 NOTE — Assessment & Plan Note (Signed)
Some issues with gait and balance.  No recent falls - since last visit.  Have discussed importance of slow position changes and movements.  Discussed home health as outlined above.  Wants to hold at this point.  Will notify me when agreeable.  Wants to hold given COVID concernes.

## 2018-11-13 NOTE — Assessment & Plan Note (Signed)
Followed by podiatry.  Seeing regularly. Notes reviewed.

## 2018-11-13 NOTE — Assessment & Plan Note (Signed)
On zoloft and doing better per report.  Follow.

## 2018-11-13 NOTE — Assessment & Plan Note (Signed)
Blood pressure has been doing well.  Same medication.  Follow pressures.  Follow metabolic panel.

## 2018-11-13 NOTE — Assessment & Plan Note (Signed)
Follow lipid panel.   

## 2018-11-14 ENCOUNTER — Telehealth: Payer: Self-pay | Admitting: Internal Medicine

## 2018-11-14 DIAGNOSIS — L97529 Non-pressure chronic ulcer of other part of left foot with unspecified severity: Secondary | ICD-10-CM

## 2018-11-14 DIAGNOSIS — E1140 Type 2 diabetes mellitus with diabetic neuropathy, unspecified: Secondary | ICD-10-CM

## 2018-11-14 DIAGNOSIS — R2681 Unsteadiness on feet: Secondary | ICD-10-CM

## 2018-11-14 NOTE — Telephone Encounter (Signed)
Copied from [place] [phone]. Topic: Quick Communication - See Telephone Encounter >> Nov 14, 2018 10:10 AM Robina Ade, [name] D wrote: CRM for notification. See Telephone encounter for: 11/14/18. Fredna Dow daughter  of [name] called and would like to talk to Dr. Nicki Reaper or her CMA about patient. Her call back number is [phone]

## 2018-11-15 ENCOUNTER — Telehealth: Payer: Self-pay

## 2018-11-15 ENCOUNTER — Other Ambulatory Visit: Payer: Self-pay

## 2018-11-15 MED ORDER — AMLODIPINE BESYLATE 5 MG PO TABS: 5.0000 mg | ORAL_TABLET | Freq: Two times a day (BID) | ORAL | 1 refills | Status: DC

## 2018-11-15 MED ORDER — SERTRALINE HCL 25 MG PO TABS: 25.0000 mg | ORAL_TABLET | Freq: Every day | ORAL | 1 refills | Status: DC

## 2018-11-15 MED ORDER — CLOPIDOGREL BISULFATE 75 MG PO TABS: 75.0000 mg | ORAL_TABLET | Freq: Every day | ORAL | 1 refills | Status: DC

## 2018-11-15 NOTE — Telephone Encounter (Signed)
Order placed for home health referral.  

## 2018-11-15 NOTE — Telephone Encounter (Signed)
See other message

## 2018-11-15 NOTE — Telephone Encounter (Signed)
Patients daughter stated that home health was discussed during virtual appt and they would like to move forward with this so that someone can start coming out.

## 2018-11-15 NOTE — Telephone Encounter (Signed)
Copied from [place] [phone]. Topic: General - Inquiry >> Nov 15, 2018  9:28 AM Rainey Pines A wrote: Reason for CRM: Patients daughter wants a callback from [place] in regards to patients medication.

## 2018-11-17 DIAGNOSIS — M6281 Muscle weakness (generalized): Secondary | ICD-10-CM | POA: Diagnosis not present

## 2018-11-17 DIAGNOSIS — Z7984 Long term (current) use of oral hypoglycemic drugs: Secondary | ICD-10-CM | POA: Diagnosis not present

## 2018-11-17 DIAGNOSIS — F419 Anxiety disorder, unspecified: Secondary | ICD-10-CM | POA: Diagnosis not present

## 2018-11-17 DIAGNOSIS — Z8673 Personal history of transient ischemic attack (TIA), and cerebral infarction without residual deficits: Secondary | ICD-10-CM | POA: Diagnosis not present

## 2018-11-17 DIAGNOSIS — E11621 Type 2 diabetes mellitus with foot ulcer: Secondary | ICD-10-CM | POA: Diagnosis not present

## 2018-11-17 DIAGNOSIS — L97421 Non-pressure chronic ulcer of left heel and midfoot limited to breakdown of skin: Secondary | ICD-10-CM | POA: Diagnosis not present

## 2018-11-17 DIAGNOSIS — I10 Essential (primary) hypertension: Secondary | ICD-10-CM | POA: Diagnosis not present

## 2018-11-17 DIAGNOSIS — E1140 Type 2 diabetes mellitus with diabetic neuropathy, unspecified: Secondary | ICD-10-CM | POA: Diagnosis not present

## 2018-11-17 DIAGNOSIS — R2689 Other abnormalities of gait and mobility: Secondary | ICD-10-CM | POA: Diagnosis not present

## 2018-11-17 NOTE — Telephone Encounter (Signed)
Advised when I spoke to daughter that this may take a few days to process

## 2018-11-22 DIAGNOSIS — L97421 Non-pressure chronic ulcer of left heel and midfoot limited to breakdown of skin: Secondary | ICD-10-CM | POA: Diagnosis not present

## 2018-11-22 DIAGNOSIS — E1140 Type 2 diabetes mellitus with diabetic neuropathy, unspecified: Secondary | ICD-10-CM | POA: Diagnosis not present

## 2018-11-22 DIAGNOSIS — Z7984 Long term (current) use of oral hypoglycemic drugs: Secondary | ICD-10-CM | POA: Diagnosis not present

## 2018-11-22 DIAGNOSIS — I10 Essential (primary) hypertension: Secondary | ICD-10-CM | POA: Diagnosis not present

## 2018-11-22 DIAGNOSIS — F419 Anxiety disorder, unspecified: Secondary | ICD-10-CM | POA: Diagnosis not present

## 2018-11-22 DIAGNOSIS — E11621 Type 2 diabetes mellitus with foot ulcer: Secondary | ICD-10-CM | POA: Diagnosis not present

## 2018-11-23 ENCOUNTER — Telehealth: Payer: Self-pay | Admitting: Internal Medicine

## 2018-11-23 DIAGNOSIS — F419 Anxiety disorder, unspecified: Secondary | ICD-10-CM | POA: Diagnosis not present

## 2018-11-23 DIAGNOSIS — E11621 Type 2 diabetes mellitus with foot ulcer: Secondary | ICD-10-CM | POA: Diagnosis not present

## 2018-11-23 DIAGNOSIS — E1140 Type 2 diabetes mellitus with diabetic neuropathy, unspecified: Secondary | ICD-10-CM | POA: Diagnosis not present

## 2018-11-23 DIAGNOSIS — Z7984 Long term (current) use of oral hypoglycemic drugs: Secondary | ICD-10-CM | POA: Diagnosis not present

## 2018-11-23 DIAGNOSIS — I10 Essential (primary) hypertension: Secondary | ICD-10-CM | POA: Diagnosis not present

## 2018-11-23 DIAGNOSIS — L97421 Non-pressure chronic ulcer of left heel and midfoot limited to breakdown of skin: Secondary | ICD-10-CM | POA: Diagnosis not present

## 2018-11-23 NOTE — Telephone Encounter (Signed)
Copied from [place] [phone]. Topic: Quick Communication - See Telephone Encounter >> Nov 23, 2018  2:56 PM Burchel, Abbi R wrote: CRM for notification. See Telephone encounter for: 11/23/18.  [name] ([facility] [phone] *ok to leave VM) requesting v/o for OT 2 x 4wks.

## 2018-11-23 NOTE — Telephone Encounter (Signed)
Verbals given to [place]. There is nothing further needed with this note.

## 2018-11-24 DIAGNOSIS — Z7984 Long term (current) use of oral hypoglycemic drugs: Secondary | ICD-10-CM | POA: Diagnosis not present

## 2018-11-24 DIAGNOSIS — I10 Essential (primary) hypertension: Secondary | ICD-10-CM | POA: Diagnosis not present

## 2018-11-24 DIAGNOSIS — E11621 Type 2 diabetes mellitus with foot ulcer: Secondary | ICD-10-CM | POA: Diagnosis not present

## 2018-11-24 DIAGNOSIS — L97421 Non-pressure chronic ulcer of left heel and midfoot limited to breakdown of skin: Secondary | ICD-10-CM | POA: Diagnosis not present

## 2018-11-24 DIAGNOSIS — E1140 Type 2 diabetes mellitus with diabetic neuropathy, unspecified: Secondary | ICD-10-CM | POA: Diagnosis not present

## 2018-11-24 DIAGNOSIS — R2689 Other abnormalities of gait and mobility: Secondary | ICD-10-CM | POA: Diagnosis not present

## 2018-11-24 DIAGNOSIS — F419 Anxiety disorder, unspecified: Secondary | ICD-10-CM | POA: Diagnosis not present

## 2018-11-24 DIAGNOSIS — M6281 Muscle weakness (generalized): Secondary | ICD-10-CM | POA: Diagnosis not present

## 2018-11-24 DIAGNOSIS — Z8673 Personal history of transient ischemic attack (TIA), and cerebral infarction without residual deficits: Secondary | ICD-10-CM | POA: Diagnosis not present

## 2018-11-25 DIAGNOSIS — E11621 Type 2 diabetes mellitus with foot ulcer: Secondary | ICD-10-CM | POA: Diagnosis not present

## 2018-11-25 DIAGNOSIS — L97421 Non-pressure chronic ulcer of left heel and midfoot limited to breakdown of skin: Secondary | ICD-10-CM | POA: Diagnosis not present

## 2018-11-25 DIAGNOSIS — I10 Essential (primary) hypertension: Secondary | ICD-10-CM | POA: Diagnosis not present

## 2018-11-25 DIAGNOSIS — F419 Anxiety disorder, unspecified: Secondary | ICD-10-CM | POA: Diagnosis not present

## 2018-11-25 DIAGNOSIS — Z7984 Long term (current) use of oral hypoglycemic drugs: Secondary | ICD-10-CM | POA: Diagnosis not present

## 2018-11-25 DIAGNOSIS — E1140 Type 2 diabetes mellitus with diabetic neuropathy, unspecified: Secondary | ICD-10-CM | POA: Diagnosis not present

## 2018-11-28 DIAGNOSIS — Z7984 Long term (current) use of oral hypoglycemic drugs: Secondary | ICD-10-CM | POA: Diagnosis not present

## 2018-11-28 DIAGNOSIS — E11621 Type 2 diabetes mellitus with foot ulcer: Secondary | ICD-10-CM | POA: Diagnosis not present

## 2018-11-28 DIAGNOSIS — F419 Anxiety disorder, unspecified: Secondary | ICD-10-CM | POA: Diagnosis not present

## 2018-11-28 DIAGNOSIS — L97421 Non-pressure chronic ulcer of left heel and midfoot limited to breakdown of skin: Secondary | ICD-10-CM | POA: Diagnosis not present

## 2018-11-28 DIAGNOSIS — E1140 Type 2 diabetes mellitus with diabetic neuropathy, unspecified: Secondary | ICD-10-CM | POA: Diagnosis not present

## 2018-11-28 DIAGNOSIS — I10 Essential (primary) hypertension: Secondary | ICD-10-CM | POA: Diagnosis not present

## 2018-11-29 ENCOUNTER — Ambulatory Visit (INDEPENDENT_AMBULATORY_CARE_PROVIDER_SITE_OTHER): Payer: Medicare Other | Admitting: Podiatry

## 2018-11-29 ENCOUNTER — Other Ambulatory Visit: Payer: Self-pay

## 2018-11-29 ENCOUNTER — Encounter: Payer: Self-pay | Admitting: Podiatry

## 2018-11-29 VITALS — Temp 97.9°F

## 2018-11-29 DIAGNOSIS — E0843 Diabetes mellitus due to underlying condition with diabetic autonomic (poly)neuropathy: Secondary | ICD-10-CM

## 2018-11-29 DIAGNOSIS — L97522 Non-pressure chronic ulcer of other part of left foot with fat layer exposed: Secondary | ICD-10-CM | POA: Diagnosis not present

## 2018-11-29 DIAGNOSIS — E1140 Type 2 diabetes mellitus with diabetic neuropathy, unspecified: Secondary | ICD-10-CM | POA: Diagnosis not present

## 2018-11-29 DIAGNOSIS — L97421 Non-pressure chronic ulcer of left heel and midfoot limited to breakdown of skin: Secondary | ICD-10-CM | POA: Diagnosis not present

## 2018-11-29 DIAGNOSIS — I10 Essential (primary) hypertension: Secondary | ICD-10-CM | POA: Diagnosis not present

## 2018-11-29 DIAGNOSIS — E11621 Type 2 diabetes mellitus with foot ulcer: Secondary | ICD-10-CM | POA: Diagnosis not present

## 2018-11-29 DIAGNOSIS — Z7984 Long term (current) use of oral hypoglycemic drugs: Secondary | ICD-10-CM | POA: Diagnosis not present

## 2018-11-29 DIAGNOSIS — F419 Anxiety disorder, unspecified: Secondary | ICD-10-CM | POA: Diagnosis not present

## 2018-11-30 DIAGNOSIS — F419 Anxiety disorder, unspecified: Secondary | ICD-10-CM | POA: Diagnosis not present

## 2018-11-30 DIAGNOSIS — Z7984 Long term (current) use of oral hypoglycemic drugs: Secondary | ICD-10-CM | POA: Diagnosis not present

## 2018-11-30 DIAGNOSIS — E11621 Type 2 diabetes mellitus with foot ulcer: Secondary | ICD-10-CM | POA: Diagnosis not present

## 2018-11-30 DIAGNOSIS — I10 Essential (primary) hypertension: Secondary | ICD-10-CM | POA: Diagnosis not present

## 2018-11-30 DIAGNOSIS — L97421 Non-pressure chronic ulcer of left heel and midfoot limited to breakdown of skin: Secondary | ICD-10-CM | POA: Diagnosis not present

## 2018-11-30 DIAGNOSIS — E1140 Type 2 diabetes mellitus with diabetic neuropathy, unspecified: Secondary | ICD-10-CM | POA: Diagnosis not present

## 2018-12-01 DIAGNOSIS — E1140 Type 2 diabetes mellitus with diabetic neuropathy, unspecified: Secondary | ICD-10-CM | POA: Diagnosis not present

## 2018-12-01 DIAGNOSIS — E11621 Type 2 diabetes mellitus with foot ulcer: Secondary | ICD-10-CM | POA: Diagnosis not present

## 2018-12-01 DIAGNOSIS — Z7984 Long term (current) use of oral hypoglycemic drugs: Secondary | ICD-10-CM | POA: Diagnosis not present

## 2018-12-01 DIAGNOSIS — F419 Anxiety disorder, unspecified: Secondary | ICD-10-CM | POA: Diagnosis not present

## 2018-12-01 DIAGNOSIS — I10 Essential (primary) hypertension: Secondary | ICD-10-CM | POA: Diagnosis not present

## 2018-12-01 DIAGNOSIS — L97421 Non-pressure chronic ulcer of left heel and midfoot limited to breakdown of skin: Secondary | ICD-10-CM | POA: Diagnosis not present

## 2018-12-02 NOTE — Progress Notes (Signed)
   Subjective:  85 year old female with PMHx of DM presenting today with her son for follow up evaluation of an ulcer to the sub-first MPJ of the left foot. She states she is doing well and denies any significant pain or modifying factors. Her son states the wound is improving. She has been wearing her DM shoes as directed. Patient is here for further evaluation and treatment.   Past Medical History:  Diagnosis Date  . Allergy   . Chicken pox   . CVA (cerebral vascular accident) ([place])   . Diabetes mellitus ([place])   . Hypercholesterolemia   . Hypertension       Objective/Physical Exam General: The patient is alert and oriented x3 in no acute distress.  Dermatology:  Wound #1 noted to the sub-first MPJ of the left foot measuring 1.5 x 1.0 x 0.1 cm (LxWxD).    To the noted ulceration(s), there is no eschar. There is a moderate amount of slough, fibrin, and necrotic tissue noted. Granulation tissue and wound base is red. There is a minimal amount of serosanguineous drainage noted. There is no exposed bone muscle-tendon ligament or joint. There is no malodor. Periwound integrity is intact. Skin is warm, dry and supple bilateral lower extremities.  Vascular: Palpable pedal pulses bilaterally. No edema or erythema noted. Capillary refill within normal limits.  Neurological: Epicritic and protective threshold diminished bilaterally.   Musculoskeletal Exam: Range of motion within normal limits to all pedal and ankle joints bilateral. Muscle strength 5/5 in all groups bilateral.   Assessment: #1 ulceration of the sub-first MPJ of the left foot secondary to diabetes mellitus #2 diabetes mellitus w/ peripheral neuropathy   Plan of Care:  #1 Patient was evaluated. #2 Medically necessary excisional debridement including subcutaneous tissue was performed using a tissue nipper and a chisel blade. Excisional debridement of all the necrotic nonviable tissue down to healthy bleeding viable tissue  was performed with post-debridement measurements same as pre-. #3 The wound was cleansed and dry sterile dressing applied. #4 Aquacel Ag applied.  #5 Continue wearing DM shoes for rehab and post op shoe as needed.  #6 Order placed for Aquacel Ag wound supplies.  #7 Return to clinic in 3 weeks.     [name], DPM [facility]  Dr. [name], [address]                                        [place], [place] [address]                Office [phone]  Fax [phone]

## 2018-12-05 DIAGNOSIS — Z7984 Long term (current) use of oral hypoglycemic drugs: Secondary | ICD-10-CM | POA: Diagnosis not present

## 2018-12-05 DIAGNOSIS — L97421 Non-pressure chronic ulcer of left heel and midfoot limited to breakdown of skin: Secondary | ICD-10-CM | POA: Diagnosis not present

## 2018-12-05 DIAGNOSIS — I10 Essential (primary) hypertension: Secondary | ICD-10-CM | POA: Diagnosis not present

## 2018-12-05 DIAGNOSIS — F419 Anxiety disorder, unspecified: Secondary | ICD-10-CM | POA: Diagnosis not present

## 2018-12-05 DIAGNOSIS — E11621 Type 2 diabetes mellitus with foot ulcer: Secondary | ICD-10-CM | POA: Diagnosis not present

## 2018-12-05 DIAGNOSIS — E1140 Type 2 diabetes mellitus with diabetic neuropathy, unspecified: Secondary | ICD-10-CM | POA: Diagnosis not present

## 2018-12-06 DIAGNOSIS — L97421 Non-pressure chronic ulcer of left heel and midfoot limited to breakdown of skin: Secondary | ICD-10-CM | POA: Diagnosis not present

## 2018-12-06 DIAGNOSIS — I10 Essential (primary) hypertension: Secondary | ICD-10-CM | POA: Diagnosis not present

## 2018-12-06 DIAGNOSIS — Z7984 Long term (current) use of oral hypoglycemic drugs: Secondary | ICD-10-CM | POA: Diagnosis not present

## 2018-12-06 DIAGNOSIS — F419 Anxiety disorder, unspecified: Secondary | ICD-10-CM | POA: Diagnosis not present

## 2018-12-06 DIAGNOSIS — E1140 Type 2 diabetes mellitus with diabetic neuropathy, unspecified: Secondary | ICD-10-CM | POA: Diagnosis not present

## 2018-12-06 DIAGNOSIS — E11621 Type 2 diabetes mellitus with foot ulcer: Secondary | ICD-10-CM | POA: Diagnosis not present

## 2018-12-07 DIAGNOSIS — E11621 Type 2 diabetes mellitus with foot ulcer: Secondary | ICD-10-CM | POA: Diagnosis not present

## 2018-12-07 DIAGNOSIS — F419 Anxiety disorder, unspecified: Secondary | ICD-10-CM | POA: Diagnosis not present

## 2018-12-07 DIAGNOSIS — I10 Essential (primary) hypertension: Secondary | ICD-10-CM | POA: Diagnosis not present

## 2018-12-07 DIAGNOSIS — L97421 Non-pressure chronic ulcer of left heel and midfoot limited to breakdown of skin: Secondary | ICD-10-CM | POA: Diagnosis not present

## 2018-12-07 DIAGNOSIS — E1140 Type 2 diabetes mellitus with diabetic neuropathy, unspecified: Secondary | ICD-10-CM | POA: Diagnosis not present

## 2018-12-07 DIAGNOSIS — Z7984 Long term (current) use of oral hypoglycemic drugs: Secondary | ICD-10-CM | POA: Diagnosis not present

## 2018-12-08 DIAGNOSIS — Z7984 Long term (current) use of oral hypoglycemic drugs: Secondary | ICD-10-CM | POA: Diagnosis not present

## 2018-12-08 DIAGNOSIS — F419 Anxiety disorder, unspecified: Secondary | ICD-10-CM | POA: Diagnosis not present

## 2018-12-08 DIAGNOSIS — I10 Essential (primary) hypertension: Secondary | ICD-10-CM | POA: Diagnosis not present

## 2018-12-08 DIAGNOSIS — E1140 Type 2 diabetes mellitus with diabetic neuropathy, unspecified: Secondary | ICD-10-CM | POA: Diagnosis not present

## 2018-12-08 DIAGNOSIS — L97421 Non-pressure chronic ulcer of left heel and midfoot limited to breakdown of skin: Secondary | ICD-10-CM | POA: Diagnosis not present

## 2018-12-08 DIAGNOSIS — E11621 Type 2 diabetes mellitus with foot ulcer: Secondary | ICD-10-CM | POA: Diagnosis not present

## 2018-12-13 DIAGNOSIS — E11621 Type 2 diabetes mellitus with foot ulcer: Secondary | ICD-10-CM | POA: Diagnosis not present

## 2018-12-13 DIAGNOSIS — Z7984 Long term (current) use of oral hypoglycemic drugs: Secondary | ICD-10-CM | POA: Diagnosis not present

## 2018-12-13 DIAGNOSIS — I10 Essential (primary) hypertension: Secondary | ICD-10-CM | POA: Diagnosis not present

## 2018-12-13 DIAGNOSIS — L97421 Non-pressure chronic ulcer of left heel and midfoot limited to breakdown of skin: Secondary | ICD-10-CM | POA: Diagnosis not present

## 2018-12-13 DIAGNOSIS — F419 Anxiety disorder, unspecified: Secondary | ICD-10-CM | POA: Diagnosis not present

## 2018-12-13 DIAGNOSIS — E1140 Type 2 diabetes mellitus with diabetic neuropathy, unspecified: Secondary | ICD-10-CM | POA: Diagnosis not present

## 2018-12-14 DIAGNOSIS — L97421 Non-pressure chronic ulcer of left heel and midfoot limited to breakdown of skin: Secondary | ICD-10-CM | POA: Diagnosis not present

## 2018-12-14 DIAGNOSIS — I10 Essential (primary) hypertension: Secondary | ICD-10-CM | POA: Diagnosis not present

## 2018-12-14 DIAGNOSIS — E1140 Type 2 diabetes mellitus with diabetic neuropathy, unspecified: Secondary | ICD-10-CM | POA: Diagnosis not present

## 2018-12-14 DIAGNOSIS — F419 Anxiety disorder, unspecified: Secondary | ICD-10-CM | POA: Diagnosis not present

## 2018-12-14 DIAGNOSIS — Z7984 Long term (current) use of oral hypoglycemic drugs: Secondary | ICD-10-CM | POA: Diagnosis not present

## 2018-12-14 DIAGNOSIS — E11621 Type 2 diabetes mellitus with foot ulcer: Secondary | ICD-10-CM | POA: Diagnosis not present

## 2018-12-15 DIAGNOSIS — I10 Essential (primary) hypertension: Secondary | ICD-10-CM | POA: Diagnosis not present

## 2018-12-15 DIAGNOSIS — L97421 Non-pressure chronic ulcer of left heel and midfoot limited to breakdown of skin: Secondary | ICD-10-CM | POA: Diagnosis not present

## 2018-12-15 DIAGNOSIS — E1140 Type 2 diabetes mellitus with diabetic neuropathy, unspecified: Secondary | ICD-10-CM | POA: Diagnosis not present

## 2018-12-15 DIAGNOSIS — Z7984 Long term (current) use of oral hypoglycemic drugs: Secondary | ICD-10-CM | POA: Diagnosis not present

## 2018-12-15 DIAGNOSIS — F419 Anxiety disorder, unspecified: Secondary | ICD-10-CM | POA: Diagnosis not present

## 2018-12-15 DIAGNOSIS — E11621 Type 2 diabetes mellitus with foot ulcer: Secondary | ICD-10-CM | POA: Diagnosis not present

## 2018-12-16 ENCOUNTER — Telehealth: Payer: Self-pay | Admitting: Internal Medicine

## 2018-12-16 DIAGNOSIS — E1140 Type 2 diabetes mellitus with diabetic neuropathy, unspecified: Secondary | ICD-10-CM | POA: Diagnosis not present

## 2018-12-16 DIAGNOSIS — E11621 Type 2 diabetes mellitus with foot ulcer: Secondary | ICD-10-CM | POA: Diagnosis not present

## 2018-12-16 DIAGNOSIS — F419 Anxiety disorder, unspecified: Secondary | ICD-10-CM | POA: Diagnosis not present

## 2018-12-16 DIAGNOSIS — L97421 Non-pressure chronic ulcer of left heel and midfoot limited to breakdown of skin: Secondary | ICD-10-CM | POA: Diagnosis not present

## 2018-12-16 DIAGNOSIS — Z7984 Long term (current) use of oral hypoglycemic drugs: Secondary | ICD-10-CM | POA: Diagnosis not present

## 2018-12-16 DIAGNOSIS — I10 Essential (primary) hypertension: Secondary | ICD-10-CM | POA: Diagnosis not present

## 2018-12-16 NOTE — Telephone Encounter (Signed)
Left message to return call ok for [facility] nurse to give verbal order.

## 2018-12-16 NOTE — Telephone Encounter (Signed)
Copied from [place] [phone]. Topic: Quick Communication - Home Health Verbal Orders >> Dec 16, 2018  8:39 AM Leward Quan A wrote: Caller/Agency: [name] / [facility]  Callback Number: [phone] ok to LM  Requesting OT/PT/Skilled Nursing/Social Work/Speech Therapy: OT Frequency: 2x week 4 weeks

## 2018-12-17 DIAGNOSIS — Z7984 Long term (current) use of oral hypoglycemic drugs: Secondary | ICD-10-CM | POA: Diagnosis not present

## 2018-12-17 DIAGNOSIS — R2689 Other abnormalities of gait and mobility: Secondary | ICD-10-CM | POA: Diagnosis not present

## 2018-12-17 DIAGNOSIS — E1140 Type 2 diabetes mellitus with diabetic neuropathy, unspecified: Secondary | ICD-10-CM | POA: Diagnosis not present

## 2018-12-17 DIAGNOSIS — F419 Anxiety disorder, unspecified: Secondary | ICD-10-CM | POA: Diagnosis not present

## 2018-12-17 DIAGNOSIS — L97421 Non-pressure chronic ulcer of left heel and midfoot limited to breakdown of skin: Secondary | ICD-10-CM | POA: Diagnosis not present

## 2018-12-17 DIAGNOSIS — M6281 Muscle weakness (generalized): Secondary | ICD-10-CM | POA: Diagnosis not present

## 2018-12-17 DIAGNOSIS — I10 Essential (primary) hypertension: Secondary | ICD-10-CM | POA: Diagnosis not present

## 2018-12-17 DIAGNOSIS — Z8673 Personal history of transient ischemic attack (TIA), and cerebral infarction without residual deficits: Secondary | ICD-10-CM | POA: Diagnosis not present

## 2018-12-17 DIAGNOSIS — E11621 Type 2 diabetes mellitus with foot ulcer: Secondary | ICD-10-CM | POA: Diagnosis not present

## 2018-12-19 DIAGNOSIS — E11621 Type 2 diabetes mellitus with foot ulcer: Secondary | ICD-10-CM | POA: Diagnosis not present

## 2018-12-19 DIAGNOSIS — L97421 Non-pressure chronic ulcer of left heel and midfoot limited to breakdown of skin: Secondary | ICD-10-CM | POA: Diagnosis not present

## 2018-12-19 DIAGNOSIS — E1140 Type 2 diabetes mellitus with diabetic neuropathy, unspecified: Secondary | ICD-10-CM | POA: Diagnosis not present

## 2018-12-19 DIAGNOSIS — I10 Essential (primary) hypertension: Secondary | ICD-10-CM | POA: Diagnosis not present

## 2018-12-19 DIAGNOSIS — F419 Anxiety disorder, unspecified: Secondary | ICD-10-CM | POA: Diagnosis not present

## 2018-12-19 DIAGNOSIS — Z7984 Long term (current) use of oral hypoglycemic drugs: Secondary | ICD-10-CM | POA: Diagnosis not present

## 2018-12-19 NOTE — Telephone Encounter (Signed)
Verbal given 

## 2018-12-20 ENCOUNTER — Ambulatory Visit (INDEPENDENT_AMBULATORY_CARE_PROVIDER_SITE_OTHER): Payer: Medicare Other | Admitting: Podiatry

## 2018-12-20 ENCOUNTER — Other Ambulatory Visit: Payer: Self-pay | Admitting: Internal Medicine

## 2018-12-20 ENCOUNTER — Other Ambulatory Visit: Payer: Self-pay

## 2018-12-20 ENCOUNTER — Encounter: Payer: Self-pay | Admitting: Podiatry

## 2018-12-20 VITALS — Temp 97.6°F

## 2018-12-20 DIAGNOSIS — L97522 Non-pressure chronic ulcer of other part of left foot with fat layer exposed: Secondary | ICD-10-CM

## 2018-12-20 DIAGNOSIS — E0843 Diabetes mellitus due to underlying condition with diabetic autonomic (poly)neuropathy: Secondary | ICD-10-CM

## 2018-12-21 DIAGNOSIS — L97421 Non-pressure chronic ulcer of left heel and midfoot limited to breakdown of skin: Secondary | ICD-10-CM | POA: Diagnosis not present

## 2018-12-21 DIAGNOSIS — E11621 Type 2 diabetes mellitus with foot ulcer: Secondary | ICD-10-CM | POA: Diagnosis not present

## 2018-12-21 DIAGNOSIS — Z7984 Long term (current) use of oral hypoglycemic drugs: Secondary | ICD-10-CM | POA: Diagnosis not present

## 2018-12-21 DIAGNOSIS — I10 Essential (primary) hypertension: Secondary | ICD-10-CM | POA: Diagnosis not present

## 2018-12-21 DIAGNOSIS — E1140 Type 2 diabetes mellitus with diabetic neuropathy, unspecified: Secondary | ICD-10-CM | POA: Diagnosis not present

## 2018-12-21 DIAGNOSIS — F419 Anxiety disorder, unspecified: Secondary | ICD-10-CM | POA: Diagnosis not present

## 2018-12-22 NOTE — Progress Notes (Signed)
   Subjective:  85 year old female with PMHx of DM presenting today for follow up evaluation of an ulceration of the left foot. She states the wound was improving until two days ago when it started looking bad again. There are no modifying factors noted and she has been using Aquacel Ag as directed for treatment. Patient is here for further evaluation and treatment.   Past Medical History:  Diagnosis Date  . Allergy   . Chicken pox   . CVA (cerebral vascular accident) ([place])   . Diabetes mellitus ([place])   . Hypercholesterolemia   . Hypertension       Objective/Physical Exam General: The patient is alert and oriented x3 in no acute distress.  Dermatology:  Wound #1 noted to the sub-first MPJ of the left foot measuring 1.5 x 1.5 x 0.2 cm (LxWxD).    To the noted ulceration(s), there is no eschar. There is a moderate amount of slough, fibrin, and necrotic tissue noted. Granulation tissue and wound base is red. There is a minimal amount of serosanguineous drainage noted. There is no exposed bone muscle-tendon ligament or joint. There is no malodor. Periwound integrity is intact. Skin is warm, dry and supple bilateral lower extremities.  Vascular: Palpable pedal pulses bilaterally. No edema or erythema noted. Capillary refill within normal limits.  Neurological: Epicritic and protective threshold diminished bilaterally.   Musculoskeletal Exam: Range of motion within normal limits to all pedal and ankle joints bilateral. Muscle strength 5/5 in all groups bilateral.   Assessment: #1 ulceration of the sub-first MPJ of the left foot secondary to diabetes mellitus #2 diabetes mellitus w/ peripheral neuropathy   Plan of Care:  #1 Patient was evaluated. #2 Medically necessary excisional debridement including subcutaneous tissue was performed using a tissue nipper and a chisel blade. Excisional debridement of all the necrotic nonviable tissue down to healthy bleeding viable tissue was  performed with post-debridement measurements same as pre-. #3 The wound was cleansed and dry sterile dressing applied. #4 Continue using Aquacel Ag every other day.  #5 Continue wearing DM shoes for rehab and post op shoe as needed.  #6 Return to clinic in 3 weeks.     [name], DPM [facility]  Dr. [name], [address]                                        [place], [place] [address]                Office [phone]  Fax [phone]

## 2018-12-23 DIAGNOSIS — L97421 Non-pressure chronic ulcer of left heel and midfoot limited to breakdown of skin: Secondary | ICD-10-CM | POA: Diagnosis not present

## 2018-12-23 DIAGNOSIS — I10 Essential (primary) hypertension: Secondary | ICD-10-CM | POA: Diagnosis not present

## 2018-12-23 DIAGNOSIS — Z7984 Long term (current) use of oral hypoglycemic drugs: Secondary | ICD-10-CM | POA: Diagnosis not present

## 2018-12-23 DIAGNOSIS — E1140 Type 2 diabetes mellitus with diabetic neuropathy, unspecified: Secondary | ICD-10-CM | POA: Diagnosis not present

## 2018-12-23 DIAGNOSIS — F419 Anxiety disorder, unspecified: Secondary | ICD-10-CM | POA: Diagnosis not present

## 2018-12-23 DIAGNOSIS — E11621 Type 2 diabetes mellitus with foot ulcer: Secondary | ICD-10-CM | POA: Diagnosis not present

## 2018-12-26 DIAGNOSIS — E11621 Type 2 diabetes mellitus with foot ulcer: Secondary | ICD-10-CM | POA: Diagnosis not present

## 2018-12-26 DIAGNOSIS — E1140 Type 2 diabetes mellitus with diabetic neuropathy, unspecified: Secondary | ICD-10-CM | POA: Diagnosis not present

## 2018-12-26 DIAGNOSIS — L97421 Non-pressure chronic ulcer of left heel and midfoot limited to breakdown of skin: Secondary | ICD-10-CM | POA: Diagnosis not present

## 2018-12-26 DIAGNOSIS — I10 Essential (primary) hypertension: Secondary | ICD-10-CM | POA: Diagnosis not present

## 2018-12-26 DIAGNOSIS — Z7984 Long term (current) use of oral hypoglycemic drugs: Secondary | ICD-10-CM | POA: Diagnosis not present

## 2018-12-26 DIAGNOSIS — F419 Anxiety disorder, unspecified: Secondary | ICD-10-CM | POA: Diagnosis not present

## 2018-12-27 DIAGNOSIS — I10 Essential (primary) hypertension: Secondary | ICD-10-CM | POA: Diagnosis not present

## 2018-12-27 DIAGNOSIS — E1140 Type 2 diabetes mellitus with diabetic neuropathy, unspecified: Secondary | ICD-10-CM | POA: Diagnosis not present

## 2018-12-27 DIAGNOSIS — L97421 Non-pressure chronic ulcer of left heel and midfoot limited to breakdown of skin: Secondary | ICD-10-CM | POA: Diagnosis not present

## 2018-12-27 DIAGNOSIS — Z7984 Long term (current) use of oral hypoglycemic drugs: Secondary | ICD-10-CM | POA: Diagnosis not present

## 2018-12-27 DIAGNOSIS — F419 Anxiety disorder, unspecified: Secondary | ICD-10-CM | POA: Diagnosis not present

## 2018-12-27 DIAGNOSIS — E11621 Type 2 diabetes mellitus with foot ulcer: Secondary | ICD-10-CM | POA: Diagnosis not present

## 2018-12-28 DIAGNOSIS — L97421 Non-pressure chronic ulcer of left heel and midfoot limited to breakdown of skin: Secondary | ICD-10-CM | POA: Diagnosis not present

## 2018-12-28 DIAGNOSIS — E11621 Type 2 diabetes mellitus with foot ulcer: Secondary | ICD-10-CM | POA: Diagnosis not present

## 2018-12-28 DIAGNOSIS — F419 Anxiety disorder, unspecified: Secondary | ICD-10-CM | POA: Diagnosis not present

## 2018-12-28 DIAGNOSIS — Z7984 Long term (current) use of oral hypoglycemic drugs: Secondary | ICD-10-CM | POA: Diagnosis not present

## 2018-12-28 DIAGNOSIS — E1140 Type 2 diabetes mellitus with diabetic neuropathy, unspecified: Secondary | ICD-10-CM | POA: Diagnosis not present

## 2018-12-28 DIAGNOSIS — I10 Essential (primary) hypertension: Secondary | ICD-10-CM | POA: Diagnosis not present

## 2019-01-02 DIAGNOSIS — I10 Essential (primary) hypertension: Secondary | ICD-10-CM | POA: Diagnosis not present

## 2019-01-02 DIAGNOSIS — Z7984 Long term (current) use of oral hypoglycemic drugs: Secondary | ICD-10-CM | POA: Diagnosis not present

## 2019-01-02 DIAGNOSIS — E11621 Type 2 diabetes mellitus with foot ulcer: Secondary | ICD-10-CM | POA: Diagnosis not present

## 2019-01-02 DIAGNOSIS — L97421 Non-pressure chronic ulcer of left heel and midfoot limited to breakdown of skin: Secondary | ICD-10-CM | POA: Diagnosis not present

## 2019-01-02 DIAGNOSIS — F419 Anxiety disorder, unspecified: Secondary | ICD-10-CM | POA: Diagnosis not present

## 2019-01-02 DIAGNOSIS — E1140 Type 2 diabetes mellitus with diabetic neuropathy, unspecified: Secondary | ICD-10-CM | POA: Diagnosis not present

## 2019-01-04 DIAGNOSIS — E1140 Type 2 diabetes mellitus with diabetic neuropathy, unspecified: Secondary | ICD-10-CM | POA: Diagnosis not present

## 2019-01-04 DIAGNOSIS — F419 Anxiety disorder, unspecified: Secondary | ICD-10-CM | POA: Diagnosis not present

## 2019-01-04 DIAGNOSIS — Z7984 Long term (current) use of oral hypoglycemic drugs: Secondary | ICD-10-CM | POA: Diagnosis not present

## 2019-01-04 DIAGNOSIS — L97421 Non-pressure chronic ulcer of left heel and midfoot limited to breakdown of skin: Secondary | ICD-10-CM | POA: Diagnosis not present

## 2019-01-04 DIAGNOSIS — I10 Essential (primary) hypertension: Secondary | ICD-10-CM | POA: Diagnosis not present

## 2019-01-04 DIAGNOSIS — E11621 Type 2 diabetes mellitus with foot ulcer: Secondary | ICD-10-CM | POA: Diagnosis not present

## 2019-01-06 DIAGNOSIS — Z7984 Long term (current) use of oral hypoglycemic drugs: Secondary | ICD-10-CM | POA: Diagnosis not present

## 2019-01-06 DIAGNOSIS — F419 Anxiety disorder, unspecified: Secondary | ICD-10-CM | POA: Diagnosis not present

## 2019-01-06 DIAGNOSIS — L97421 Non-pressure chronic ulcer of left heel and midfoot limited to breakdown of skin: Secondary | ICD-10-CM | POA: Diagnosis not present

## 2019-01-06 DIAGNOSIS — E1140 Type 2 diabetes mellitus with diabetic neuropathy, unspecified: Secondary | ICD-10-CM | POA: Diagnosis not present

## 2019-01-06 DIAGNOSIS — E11621 Type 2 diabetes mellitus with foot ulcer: Secondary | ICD-10-CM | POA: Diagnosis not present

## 2019-01-06 DIAGNOSIS — I10 Essential (primary) hypertension: Secondary | ICD-10-CM | POA: Diagnosis not present

## 2019-01-10 ENCOUNTER — Encounter: Payer: Self-pay | Admitting: Podiatry

## 2019-01-10 ENCOUNTER — Other Ambulatory Visit: Payer: Self-pay

## 2019-01-10 ENCOUNTER — Ambulatory Visit (INDEPENDENT_AMBULATORY_CARE_PROVIDER_SITE_OTHER): Payer: Medicare Other | Admitting: Podiatry

## 2019-01-10 VITALS — Temp 97.3°F

## 2019-01-10 DIAGNOSIS — L97522 Non-pressure chronic ulcer of other part of left foot with fat layer exposed: Secondary | ICD-10-CM

## 2019-01-10 DIAGNOSIS — E0843 Diabetes mellitus due to underlying condition with diabetic autonomic (poly)neuropathy: Secondary | ICD-10-CM

## 2019-01-11 DIAGNOSIS — E11621 Type 2 diabetes mellitus with foot ulcer: Secondary | ICD-10-CM | POA: Diagnosis not present

## 2019-01-11 DIAGNOSIS — I10 Essential (primary) hypertension: Secondary | ICD-10-CM | POA: Diagnosis not present

## 2019-01-11 DIAGNOSIS — L97421 Non-pressure chronic ulcer of left heel and midfoot limited to breakdown of skin: Secondary | ICD-10-CM | POA: Diagnosis not present

## 2019-01-11 DIAGNOSIS — E1140 Type 2 diabetes mellitus with diabetic neuropathy, unspecified: Secondary | ICD-10-CM | POA: Diagnosis not present

## 2019-01-11 DIAGNOSIS — F419 Anxiety disorder, unspecified: Secondary | ICD-10-CM | POA: Diagnosis not present

## 2019-01-11 DIAGNOSIS — Z7984 Long term (current) use of oral hypoglycemic drugs: Secondary | ICD-10-CM | POA: Diagnosis not present

## 2019-01-12 DIAGNOSIS — L97421 Non-pressure chronic ulcer of left heel and midfoot limited to breakdown of skin: Secondary | ICD-10-CM | POA: Diagnosis not present

## 2019-01-12 DIAGNOSIS — Z7984 Long term (current) use of oral hypoglycemic drugs: Secondary | ICD-10-CM | POA: Diagnosis not present

## 2019-01-12 DIAGNOSIS — F419 Anxiety disorder, unspecified: Secondary | ICD-10-CM | POA: Diagnosis not present

## 2019-01-12 DIAGNOSIS — E11621 Type 2 diabetes mellitus with foot ulcer: Secondary | ICD-10-CM | POA: Diagnosis not present

## 2019-01-12 DIAGNOSIS — E1140 Type 2 diabetes mellitus with diabetic neuropathy, unspecified: Secondary | ICD-10-CM | POA: Diagnosis not present

## 2019-01-12 DIAGNOSIS — I10 Essential (primary) hypertension: Secondary | ICD-10-CM | POA: Diagnosis not present

## 2019-01-12 NOTE — Progress Notes (Signed)
   Subjective:  85 year old female with PMHx of DM presenting today for follow up evaluation of an ulceration of the left foot. She states she is doing well and her wound is improving. She has been using Aquacel Ag as directed. There are no modifying factors noted. Patient is here for further evaluation and treatment.   Past Medical History:  Diagnosis Date  . Allergy   . Chicken pox   . CVA (cerebral vascular accident) ([place])   . Diabetes mellitus ([place])   . Hypercholesterolemia   . Hypertension       Objective/Physical Exam General: The patient is alert and oriented x3 in no acute distress.  Dermatology:  Wound #1 noted to the sub-first MPJ of the left foot measuring 0.6 x 0.6 x 0.2 cm (LxWxD).    To the noted ulceration(s), there is no eschar. There is a moderate amount of slough, fibrin, and necrotic tissue noted. Granulation tissue and wound base is red. There is a minimal amount of serosanguineous drainage noted. There is no exposed bone muscle-tendon ligament or joint. There is no malodor. Periwound integrity is intact. Skin is warm, dry and supple bilateral lower extremities.  Vascular: Palpable pedal pulses bilaterally. No edema or erythema noted. Capillary refill within normal limits.  Neurological: Epicritic and protective threshold diminished bilaterally.   Musculoskeletal Exam: Range of motion within normal limits to all pedal and ankle joints bilateral. Muscle strength 5/5 in all groups bilateral.   Assessment: #1 ulceration of the sub-first MPJ of the left foot secondary to diabetes mellitus #2 diabetes mellitus w/ peripheral neuropathy   Plan of Care:  #1 Patient was evaluated. #2 Medically necessary excisional debridement including subcutaneous tissue was performed using a tissue nipper and a chisel blade. Excisional debridement of all the necrotic nonviable tissue down to healthy bleeding viable tissue was performed with post-debridement measurements same as  pre-. #3 The wound was cleansed and dry sterile dressing applied. #4 Continue using Aquacel Ag and a dry sterile dressing three times per week. .  #5 Dressing supplies ordered.  #6 Return to clinic in 3 weeks.     [name], DPM [facility]  Dr. [name], [address]                                        [place], [place] [address]                Office [phone]  Fax [phone]

## 2019-01-13 DIAGNOSIS — E11621 Type 2 diabetes mellitus with foot ulcer: Secondary | ICD-10-CM | POA: Diagnosis not present

## 2019-01-13 DIAGNOSIS — L97421 Non-pressure chronic ulcer of left heel and midfoot limited to breakdown of skin: Secondary | ICD-10-CM | POA: Diagnosis not present

## 2019-01-13 DIAGNOSIS — Z7984 Long term (current) use of oral hypoglycemic drugs: Secondary | ICD-10-CM | POA: Diagnosis not present

## 2019-01-13 DIAGNOSIS — F419 Anxiety disorder, unspecified: Secondary | ICD-10-CM | POA: Diagnosis not present

## 2019-01-13 DIAGNOSIS — I10 Essential (primary) hypertension: Secondary | ICD-10-CM | POA: Diagnosis not present

## 2019-01-13 DIAGNOSIS — E1140 Type 2 diabetes mellitus with diabetic neuropathy, unspecified: Secondary | ICD-10-CM | POA: Diagnosis not present

## 2019-01-16 DIAGNOSIS — M6281 Muscle weakness (generalized): Secondary | ICD-10-CM

## 2019-01-16 DIAGNOSIS — R2689 Other abnormalities of gait and mobility: Secondary | ICD-10-CM

## 2019-01-16 DIAGNOSIS — E1140 Type 2 diabetes mellitus with diabetic neuropathy, unspecified: Secondary | ICD-10-CM

## 2019-01-16 DIAGNOSIS — Z8673 Personal history of transient ischemic attack (TIA), and cerebral infarction without residual deficits: Secondary | ICD-10-CM | POA: Diagnosis not present

## 2019-01-16 DIAGNOSIS — I10 Essential (primary) hypertension: Secondary | ICD-10-CM

## 2019-01-16 DIAGNOSIS — L97421 Non-pressure chronic ulcer of left heel and midfoot limited to breakdown of skin: Secondary | ICD-10-CM

## 2019-01-16 DIAGNOSIS — F419 Anxiety disorder, unspecified: Secondary | ICD-10-CM

## 2019-01-16 DIAGNOSIS — Z7984 Long term (current) use of oral hypoglycemic drugs: Secondary | ICD-10-CM

## 2019-01-16 DIAGNOSIS — E11621 Type 2 diabetes mellitus with foot ulcer: Secondary | ICD-10-CM

## 2019-01-17 DIAGNOSIS — F419 Anxiety disorder, unspecified: Secondary | ICD-10-CM | POA: Diagnosis not present

## 2019-01-17 DIAGNOSIS — E11621 Type 2 diabetes mellitus with foot ulcer: Secondary | ICD-10-CM | POA: Diagnosis not present

## 2019-01-17 DIAGNOSIS — E1140 Type 2 diabetes mellitus with diabetic neuropathy, unspecified: Secondary | ICD-10-CM | POA: Diagnosis not present

## 2019-01-17 DIAGNOSIS — L97421 Non-pressure chronic ulcer of left heel and midfoot limited to breakdown of skin: Secondary | ICD-10-CM | POA: Diagnosis not present

## 2019-01-17 DIAGNOSIS — Z7984 Long term (current) use of oral hypoglycemic drugs: Secondary | ICD-10-CM | POA: Diagnosis not present

## 2019-01-17 DIAGNOSIS — I10 Essential (primary) hypertension: Secondary | ICD-10-CM | POA: Diagnosis not present

## 2019-01-23 ENCOUNTER — Ambulatory Visit: Payer: Self-pay

## 2019-01-23 DIAGNOSIS — E11621 Type 2 diabetes mellitus with foot ulcer: Secondary | ICD-10-CM | POA: Diagnosis not present

## 2019-01-23 DIAGNOSIS — Z7984 Long term (current) use of oral hypoglycemic drugs: Secondary | ICD-10-CM | POA: Diagnosis not present

## 2019-01-23 DIAGNOSIS — E1140 Type 2 diabetes mellitus with diabetic neuropathy, unspecified: Secondary | ICD-10-CM | POA: Diagnosis not present

## 2019-01-23 DIAGNOSIS — F419 Anxiety disorder, unspecified: Secondary | ICD-10-CM | POA: Diagnosis not present

## 2019-01-23 DIAGNOSIS — I10 Essential (primary) hypertension: Secondary | ICD-10-CM | POA: Diagnosis not present

## 2019-01-23 DIAGNOSIS — L97421 Non-pressure chronic ulcer of left heel and midfoot limited to breakdown of skin: Secondary | ICD-10-CM | POA: Diagnosis not present

## 2019-01-23 NOTE — Telephone Encounter (Signed)
Incoming call from [place] of [facility], Reports Patient had a fall in he yard today.  Obtained about 5 cm bleeding.  Nurse states that she cleaned iut up and applied antibiotic ointment and steri strips on it Nurse Lilia Pro phone number is [phone].

## 2019-01-24 NOTE — Telephone Encounter (Signed)
Thanks for the update.  Please call and confirm doing ok and to keep [place] posted.  Let us know if needs anything.

## 2019-01-24 NOTE — Telephone Encounter (Signed)
Confirmed pt doing ok

## 2019-01-24 NOTE — Telephone Encounter (Signed)
Noted. Lilia Pro is going to check on her on Thursday

## 2019-01-24 NOTE — Telephone Encounter (Signed)
Called and spoke to patient's daughter.  Patient's daughter said that patient fell yesterday while she was by herself and family called [facility] to come out.  Pt's daughter said that pt scraped up her right shin.  Daughter denied pt having a head injury.  Pt's daughter said that home health nurse cleaned up scrape. Pt's daughter said patient is doing great.  Pt's daughter said patient was talking and walking around fine.    Will forward to PCP for review.

## 2019-01-26 DIAGNOSIS — I10 Essential (primary) hypertension: Secondary | ICD-10-CM | POA: Diagnosis not present

## 2019-01-26 DIAGNOSIS — E11621 Type 2 diabetes mellitus with foot ulcer: Secondary | ICD-10-CM | POA: Diagnosis not present

## 2019-01-26 DIAGNOSIS — F419 Anxiety disorder, unspecified: Secondary | ICD-10-CM | POA: Diagnosis not present

## 2019-01-26 DIAGNOSIS — L97421 Non-pressure chronic ulcer of left heel and midfoot limited to breakdown of skin: Secondary | ICD-10-CM | POA: Diagnosis not present

## 2019-01-26 DIAGNOSIS — E1140 Type 2 diabetes mellitus with diabetic neuropathy, unspecified: Secondary | ICD-10-CM | POA: Diagnosis not present

## 2019-01-26 DIAGNOSIS — Z7984 Long term (current) use of oral hypoglycemic drugs: Secondary | ICD-10-CM | POA: Diagnosis not present

## 2019-01-27 DIAGNOSIS — Z7984 Long term (current) use of oral hypoglycemic drugs: Secondary | ICD-10-CM

## 2019-01-27 DIAGNOSIS — Z8673 Personal history of transient ischemic attack (TIA), and cerebral infarction without residual deficits: Secondary | ICD-10-CM | POA: Diagnosis not present

## 2019-01-27 DIAGNOSIS — L97421 Non-pressure chronic ulcer of left heel and midfoot limited to breakdown of skin: Secondary | ICD-10-CM | POA: Diagnosis not present

## 2019-01-27 DIAGNOSIS — I10 Essential (primary) hypertension: Secondary | ICD-10-CM | POA: Diagnosis not present

## 2019-01-27 DIAGNOSIS — E1140 Type 2 diabetes mellitus with diabetic neuropathy, unspecified: Secondary | ICD-10-CM

## 2019-01-27 DIAGNOSIS — F419 Anxiety disorder, unspecified: Secondary | ICD-10-CM | POA: Diagnosis not present

## 2019-01-27 DIAGNOSIS — R2689 Other abnormalities of gait and mobility: Secondary | ICD-10-CM | POA: Diagnosis not present

## 2019-01-27 DIAGNOSIS — E11621 Type 2 diabetes mellitus with foot ulcer: Secondary | ICD-10-CM | POA: Diagnosis not present

## 2019-01-27 DIAGNOSIS — M6281 Muscle weakness (generalized): Secondary | ICD-10-CM | POA: Diagnosis not present

## 2019-01-31 ENCOUNTER — Ambulatory Visit (INDEPENDENT_AMBULATORY_CARE_PROVIDER_SITE_OTHER): Payer: Medicare Other | Admitting: Podiatry

## 2019-01-31 ENCOUNTER — Encounter: Payer: Self-pay | Admitting: Podiatry

## 2019-01-31 ENCOUNTER — Other Ambulatory Visit: Payer: Self-pay

## 2019-01-31 VITALS — Temp 96.1°F

## 2019-01-31 DIAGNOSIS — L97522 Non-pressure chronic ulcer of other part of left foot with fat layer exposed: Secondary | ICD-10-CM

## 2019-01-31 DIAGNOSIS — E0843 Diabetes mellitus due to underlying condition with diabetic autonomic (poly)neuropathy: Secondary | ICD-10-CM

## 2019-02-02 DIAGNOSIS — E11621 Type 2 diabetes mellitus with foot ulcer: Secondary | ICD-10-CM | POA: Diagnosis not present

## 2019-02-02 DIAGNOSIS — E1140 Type 2 diabetes mellitus with diabetic neuropathy, unspecified: Secondary | ICD-10-CM | POA: Diagnosis not present

## 2019-02-02 DIAGNOSIS — I10 Essential (primary) hypertension: Secondary | ICD-10-CM | POA: Diagnosis not present

## 2019-02-02 DIAGNOSIS — Z7984 Long term (current) use of oral hypoglycemic drugs: Secondary | ICD-10-CM | POA: Diagnosis not present

## 2019-02-02 DIAGNOSIS — L97421 Non-pressure chronic ulcer of left heel and midfoot limited to breakdown of skin: Secondary | ICD-10-CM | POA: Diagnosis not present

## 2019-02-02 DIAGNOSIS — F419 Anxiety disorder, unspecified: Secondary | ICD-10-CM | POA: Diagnosis not present

## 2019-02-02 NOTE — Progress Notes (Signed)
   Subjective:  85 year old female with PMHx of DM presenting today for follow up evaluation of an ulceration of the left foot. She reports some callus build up over the wound. She also reports a bruise on the left lower extremity secondary to a fall. She has been using the Aquacel Ag as directed. There are no worsening factors noted. Patient is here for further evaluation and treatment.   Past Medical History:  Diagnosis Date  . Allergy   . Chicken pox   . CVA (cerebral vascular accident) ([place])   . Diabetes mellitus ([place])   . Hypercholesterolemia   . Hypertension       Objective/Physical Exam General: The patient is alert and oriented x3 in no acute distress.  Dermatology:  Wound #1 noted to the sub-first MPJ of the left foot measuring 0.8 x 1.0 x 0.2 cm (LxWxD).    Wound #2 noted to the left anterior leg measuring 4.0 x 2.0 x 0.2 cm.   To the noted ulceration(s), there is no eschar. There is a moderate amount of slough, fibrin, and necrotic tissue noted. Granulation tissue and wound base is red. There is a minimal amount of serosanguineous drainage noted. There is no exposed bone muscle-tendon ligament or joint. There is no malodor. Periwound integrity is intact. Skin is warm, dry and supple bilateral lower extremities.  Vascular: Palpable pedal pulses bilaterally. No edema or erythema noted. Capillary refill within normal limits.  Neurological: Epicritic and protective threshold diminished bilaterally.   Musculoskeletal Exam: Range of motion within normal limits to all pedal and ankle joints bilateral. Muscle strength 5/5 in all groups bilateral.   Assessment: #1 ulceration of the sub-first MPJ of the left foot secondary to diabetes mellitus #2 diabetes mellitus w/ peripheral neuropathy #3 ulceration of the left anterior leg   Plan of Care:  #1 Patient was evaluated. #2 Medically necessary excisional debridement including subcutaneous tissue was performed using a tissue  nipper and a chisel blade. Excisional debridement of all the necrotic nonviable tissue down to healthy bleeding viable tissue was performed with post-debridement measurements same as pre-. #3 The wound was cleansed and dry sterile dressing applied. #4 Continue using Aquacel Ag and a dry sterile dressing to both wounds every other day.  #5 Continue wearing DM shoes and insoles.  #6 Return to clinic in 3 weeks.     [name], DPM [facility]  Dr. [name], [address]                                        [place], [place] [address]                Office [phone]  Fax [phone]

## 2019-02-03 ENCOUNTER — Telehealth: Payer: Self-pay | Admitting: *Deleted

## 2019-02-03 NOTE — Telephone Encounter (Signed)
Patients daughter is going to bring paperwork and drop off Monday morning. Confirmed with daughter that pt is doing okay, just needs some extra help to ensure she is not falling.

## 2019-02-03 NOTE — Telephone Encounter (Signed)
Copied from [place] [phone]. Topic: General - Inquiry >> Feb 03, 2019  8:30 AM Rainey Pines A wrote: Patients daughter called and stated that patient has been falling a lot lately and needs assistance at home and daughter has paperwork filled out regards to patient having a her as a caregiver outside of her home health so that she can take a leave of absence from work.

## 2019-02-06 ENCOUNTER — Telehealth: Payer: Self-pay | Admitting: Internal Medicine

## 2019-02-06 NOTE — Telephone Encounter (Signed)
Pt's daughter dropped off FMLA papers to be filled out so she can help her mother. Papers are up front in color folder.

## 2019-02-06 NOTE — Telephone Encounter (Signed)
Placed FMLA paperwork in folder for review. Let me know if I need to schedule her for a virtual visit or anything.

## 2019-02-07 NOTE — Telephone Encounter (Signed)
Note on the FMLA form states she is applying for a leave of absence.  Need to clarify.  Is she asking to be out of work for a set time or just needing time off intermittently for appts, etc.  See me before calling pt.

## 2019-02-08 DIAGNOSIS — I10 Essential (primary) hypertension: Secondary | ICD-10-CM | POA: Diagnosis not present

## 2019-02-08 DIAGNOSIS — E1140 Type 2 diabetes mellitus with diabetic neuropathy, unspecified: Secondary | ICD-10-CM | POA: Diagnosis not present

## 2019-02-08 DIAGNOSIS — L97421 Non-pressure chronic ulcer of left heel and midfoot limited to breakdown of skin: Secondary | ICD-10-CM | POA: Diagnosis not present

## 2019-02-08 DIAGNOSIS — E11621 Type 2 diabetes mellitus with foot ulcer: Secondary | ICD-10-CM | POA: Diagnosis not present

## 2019-02-08 DIAGNOSIS — F419 Anxiety disorder, unspecified: Secondary | ICD-10-CM | POA: Diagnosis not present

## 2019-02-08 DIAGNOSIS — Z7984 Long term (current) use of oral hypoglycemic drugs: Secondary | ICD-10-CM | POA: Diagnosis not present

## 2019-02-09 NOTE — Telephone Encounter (Signed)
Called daughter. Unable to leave a message

## 2019-02-09 NOTE — Telephone Encounter (Signed)
FMLA needs to be completed for LOA starting August 11-September 30. On Apr 20, 2019 daughter will retire. She is going to be the care giver 24/7 for her mother.

## 2019-02-14 DIAGNOSIS — L97421 Non-pressure chronic ulcer of left heel and midfoot limited to breakdown of skin: Secondary | ICD-10-CM | POA: Diagnosis not present

## 2019-02-14 DIAGNOSIS — F419 Anxiety disorder, unspecified: Secondary | ICD-10-CM | POA: Diagnosis not present

## 2019-02-14 DIAGNOSIS — E1140 Type 2 diabetes mellitus with diabetic neuropathy, unspecified: Secondary | ICD-10-CM | POA: Diagnosis not present

## 2019-02-14 DIAGNOSIS — E11621 Type 2 diabetes mellitus with foot ulcer: Secondary | ICD-10-CM | POA: Diagnosis not present

## 2019-02-14 DIAGNOSIS — Z7984 Long term (current) use of oral hypoglycemic drugs: Secondary | ICD-10-CM | POA: Diagnosis not present

## 2019-02-14 DIAGNOSIS — I10 Essential (primary) hypertension: Secondary | ICD-10-CM | POA: Diagnosis not present

## 2019-02-15 DIAGNOSIS — E1140 Type 2 diabetes mellitus with diabetic neuropathy, unspecified: Secondary | ICD-10-CM | POA: Diagnosis not present

## 2019-02-15 DIAGNOSIS — F419 Anxiety disorder, unspecified: Secondary | ICD-10-CM | POA: Diagnosis not present

## 2019-02-15 DIAGNOSIS — M6281 Muscle weakness (generalized): Secondary | ICD-10-CM | POA: Diagnosis not present

## 2019-02-15 DIAGNOSIS — Z7984 Long term (current) use of oral hypoglycemic drugs: Secondary | ICD-10-CM | POA: Diagnosis not present

## 2019-02-15 DIAGNOSIS — I10 Essential (primary) hypertension: Secondary | ICD-10-CM | POA: Diagnosis not present

## 2019-02-15 DIAGNOSIS — L97421 Non-pressure chronic ulcer of left heel and midfoot limited to breakdown of skin: Secondary | ICD-10-CM | POA: Diagnosis not present

## 2019-02-15 DIAGNOSIS — R2689 Other abnormalities of gait and mobility: Secondary | ICD-10-CM | POA: Diagnosis not present

## 2019-02-15 DIAGNOSIS — E11621 Type 2 diabetes mellitus with foot ulcer: Secondary | ICD-10-CM | POA: Diagnosis not present

## 2019-02-15 DIAGNOSIS — Z8673 Personal history of transient ischemic attack (TIA), and cerebral infarction without residual deficits: Secondary | ICD-10-CM | POA: Diagnosis not present

## 2019-02-16 ENCOUNTER — Emergency Department: Payer: Medicare Other

## 2019-02-16 ENCOUNTER — Other Ambulatory Visit: Payer: Self-pay

## 2019-02-16 ENCOUNTER — Encounter: Payer: Self-pay | Admitting: Emergency Medicine

## 2019-02-16 ENCOUNTER — Emergency Department
Admission: EM | Admit: 2019-02-16 | Discharge: 2019-02-16 | Disposition: A | Discharge status: Home / Self Care | Payer: Medicare Other | Attending: Emergency Medicine | Admitting: Emergency Medicine

## 2019-02-16 DIAGNOSIS — Z23 Encounter for immunization: Secondary | ICD-10-CM | POA: Diagnosis not present

## 2019-02-16 DIAGNOSIS — Y92008 Other place in unspecified non-institutional (private) residence as the place of occurrence of the external cause: Secondary | ICD-10-CM | POA: Insufficient documentation

## 2019-02-16 DIAGNOSIS — W01198A Fall on same level from slipping, tripping and stumbling with subsequent striking against other object, initial encounter: Secondary | ICD-10-CM | POA: Diagnosis not present

## 2019-02-16 DIAGNOSIS — I10 Essential (primary) hypertension: Secondary | ICD-10-CM | POA: Diagnosis not present

## 2019-02-16 DIAGNOSIS — S6992XA Unspecified injury of left wrist, hand and finger(s), initial encounter: Secondary | ICD-10-CM | POA: Diagnosis not present

## 2019-02-16 DIAGNOSIS — E119 Type 2 diabetes mellitus without complications: Secondary | ICD-10-CM | POA: Diagnosis not present

## 2019-02-16 DIAGNOSIS — S8992XA Unspecified injury of left lower leg, initial encounter: Secondary | ICD-10-CM | POA: Diagnosis not present

## 2019-02-16 DIAGNOSIS — M79642 Pain in left hand: Secondary | ICD-10-CM | POA: Diagnosis not present

## 2019-02-16 DIAGNOSIS — Z8673 Personal history of transient ischemic attack (TIA), and cerebral infarction without residual deficits: Secondary | ICD-10-CM | POA: Insufficient documentation

## 2019-02-16 DIAGNOSIS — M25461 Effusion, right knee: Secondary | ICD-10-CM | POA: Insufficient documentation

## 2019-02-16 DIAGNOSIS — Z7984 Long term (current) use of oral hypoglycemic drugs: Secondary | ICD-10-CM | POA: Diagnosis not present

## 2019-02-16 DIAGNOSIS — S61214A Laceration without foreign body of right ring finger without damage to nail, initial encounter: Secondary | ICD-10-CM | POA: Diagnosis not present

## 2019-02-16 DIAGNOSIS — W19XXXA Unspecified fall, initial encounter: Secondary | ICD-10-CM

## 2019-02-16 DIAGNOSIS — Y9389 Activity, other specified: Secondary | ICD-10-CM | POA: Diagnosis not present

## 2019-02-16 DIAGNOSIS — R5381 Other malaise: Secondary | ICD-10-CM | POA: Diagnosis not present

## 2019-02-16 DIAGNOSIS — Y999 Unspecified external cause status: Secondary | ICD-10-CM | POA: Diagnosis not present

## 2019-02-16 DIAGNOSIS — Z7902 Long term (current) use of antithrombotics/antiplatelets: Secondary | ICD-10-CM | POA: Diagnosis not present

## 2019-02-16 DIAGNOSIS — Z79899 Other long term (current) drug therapy: Secondary | ICD-10-CM | POA: Insufficient documentation

## 2019-02-16 DIAGNOSIS — S8991XA Unspecified injury of right lower leg, initial encounter: Secondary | ICD-10-CM | POA: Diagnosis not present

## 2019-02-16 DIAGNOSIS — M25562 Pain in left knee: Secondary | ICD-10-CM | POA: Diagnosis not present

## 2019-02-16 DIAGNOSIS — S61314A Laceration without foreign body of right ring finger with damage to nail, initial encounter: Secondary | ICD-10-CM | POA: Diagnosis not present

## 2019-02-16 DIAGNOSIS — S61409A Unspecified open wound of unspecified hand, initial encounter: Secondary | ICD-10-CM | POA: Diagnosis not present

## 2019-02-16 MED ORDER — TETANUS-DIPHTH-ACELL PERTUSSIS 5-2.5-18.5 LF-MCG/0.5 IM SUSP
0.5000 mL | Freq: Once | INTRAMUSCULAR | Status: AC
  Administered 2019-02-16: 0.5 mL via INTRAMUSCULAR
  Filled 2019-02-16: qty 0.5

## 2019-02-16 MED ORDER — CEPHALEXIN 500 MG PO CAPS: 500.0000 mg | ORAL_CAPSULE | Freq: Three times a day (TID) | ORAL | 0 refills | Status: AC

## 2019-02-16 MED ORDER — BUPIVACAINE HCL (PF) 0.5 % IJ SOLN
10.0000 mL | Freq: Once | INTRAMUSCULAR | Status: AC
  Administered 2019-02-16: 12:00:00 10 mL
  Filled 2019-02-16: qty 30

## 2019-02-16 MED ORDER — CEPHALEXIN 500 MG PO CAPS: 500.0000 mg | ORAL_CAPSULE | Freq: Three times a day (TID) | ORAL | 0 refills | Status: DC

## 2019-02-16 NOTE — ED Provider Notes (Signed)
[facility] Emergency Department Provider Note  ____________________________________________   First MD Initiated Contact with Patient 02/16/19 [id number]     (approximate)  I have reviewed the triage vital signs and the nursing notes.   HISTORY  Chief Complaint Fall    HPI Angela Patterson is a 85 y.o. female  Here with finger and knee pain after fall. Pt was reportedly trying to take her trash out today when she slipped. She fell forward onto her knees then caught herself with her b/l hands on the ground. She believes she "nicked" her right ring finger on the trash can. She sustained a laceration/avulsion to the tip of her finger with loss of her nail. She is adamant she did not hit her head. No neck pain. She has some mild, b/l aching knee pain, but has been able to ambulate. She also struck her left ring finger, but denies any significant pain here. She was well prior to this. No recent illnesses. No fevers.        Past Medical History:  Diagnosis Date  . Allergy   . Chicken pox   . CVA (cerebral vascular accident) ([place])   . Diabetes mellitus ([place])   . Hypercholesterolemia   . Hypertension     Patient Active Problem List   Diagnosis Date Noted  . Gait abnormality 11/13/2018  . Falls 08/28/2018  . Anxiety 06/22/2018  . Diarrhea 12/13/2017  . Rectal bleeding 07/07/2017  . Foot ulcer ([place]) 04/18/2017  . Open wound of foot excluding toes without complication 70/26/3785  . Health care maintenance 10/27/2014  . Weight loss 12/27/2013  . Thrombocytopenia ([place]) 11/20/2012  . Hypertension 07/21/2012  . Hypercholesterolemia 07/21/2012  . History of CVA (cerebrovascular accident) 07/21/2012  . Diabetes mellitus ([place]) 07/21/2012  . Essential (primary) hypertension 07/21/2012  . Pure hypercholesterolemia 07/21/2012  . Type 2 diabetes mellitus ([place]) 07/21/2012    Past Surgical History:  Procedure Laterality Date  . ABDOMINAL HYSTERECTOMY  1971   excessive bleeding  . APPENDECTOMY  age 29  . BREAST LUMPECTOMY  1958   benign  . DILATION AND CURETTAGE OF UTERUS  1970    Prior to Admission medications   Medication Sig Start Date End Date Taking? Authorizing Provider  amLODipine (NORVASC) 5 MG tablet Take 1 tablet (5 mg total) by mouth 2 (two) times daily. 11/15/18   Einar Pheasant, MD  cephALEXin (KEFLEX) 500 MG capsule Take 1 capsule (500 mg total) by mouth 3 (three) times daily for 7 days. 02/16/19 02/23/19  Duffy Bruce, MD  Cholecalciferol (VITAMIN D) 2000 units tablet Take by mouth.    [provider]  clopidogrel (PLAVIX) 75 MG tablet Take 1 tablet (75 mg total) by mouth daily. 11/15/18   Einar Pheasant, MD  gentamicin cream (GARAMYCIN) 0.1 % Apply 1 application topically 3 (three) times daily. 07/29/18   [name], DPM  glucose blood test strip USE TO CHECK BLOOD SUGAR  TWO TIMES DAILY 09/14/18   Einar Pheasant, MD  hydrocortisone (PROCTOCORT) 1 % CREA Apply thin layer around rectal opening 2 times per day to help hemorrhoids 05/26/18   Jodelle Green, FNP  Lancet Devices (ONE TOUCH DELICA LANCING DEV) MISC Use twice daily Dx: 250.00 10/30/13   Einar Pheasant, MD  metFORMIN (GLUCOPHAGE-XR) 500 MG 24 hr tablet TAKE 1 TABLET BY MOUTH TWO  TIMES DAILY BEFORE A MEAL. 12/20/18   Einar Pheasant, MD  Multiple Vitamin (MULTIVITAMIN) tablet Take 1 tablet by mouth daily.  [provider]  mupirocin cream (BACTROBAN) 2 % Apply topically. 02/27/14   [provider]  Omega-3 1000 MG CAPS Take by mouth.    [provider]  Probiotic Product (PROBIOTIC DAILY PO) Take 1 tablet by mouth daily.    [provider]  sertraline (ZOLOFT) 25 MG tablet Take 1 tablet (25 mg total) by mouth daily. 11/15/18   Einar Pheasant, MD  timolol (BETIMOL) 0.5 % ophthalmic solution 1 drop 2 (two) times daily.    [provider]  timolol (TIMOPTIC) 0.5 % ophthalmic solution  09/16/15   [provider]     Allergies Actos [pioglitazone], Contrast media [iodinated diagnostic agents], Prandin [repaglinide], and Pravastatin sodium  Family History  Problem Relation Age of Onset  . Liver cancer Father   . Stroke Mother   . Heart attack Brother   . Hypertension Daughter   . Hypertension Son   . Hypertension Daughter   . Cancer Grandchild        breast  . Diabetes Grandchild   . Breast cancer Neg Hx   . Colon cancer Neg Hx     Social History Social History   Tobacco Use  . Smoking status: Never Smoker  . Smokeless tobacco: Never Used  Substance Use Topics  . Alcohol use: No    Alcohol/week: 0.0 standard drinks  . Drug use: No    Review of Systems  Review of Systems  Constitutional: Negative for fatigue and fever.  HENT: Negative for congestion and sore throat.   Eyes: Negative for visual disturbance.  Respiratory: Negative for cough and shortness of breath.   Cardiovascular: Negative for chest pain.  Gastrointestinal: Negative for abdominal pain, diarrhea, nausea and vomiting.  Genitourinary: Negative for flank pain.  Musculoskeletal: Positive for arthralgias and gait problem. Negative for back pain and neck pain.  Skin: Positive for wound. Negative for rash.  Neurological: Negative for weakness.  All other systems reviewed and are negative.    ____________________________________________  PHYSICAL EXAM:      VITAL SIGNS: ED Triage Vitals  Enc Vitals Group     BP 02/16/19 0914 (!) 162/73     Pulse Rate 02/16/19 0914 80     Resp 02/16/19 0914 20     Temp 02/16/19 0914 98 F (36.7 C)     Temp Source 02/16/19 0914 Oral     SpO2 02/16/19 0914 97 %     Weight 02/16/19 0916 110 lb (49.9 kg)     Height 02/16/19 0916 5\' 5"  (1.651 m)     Head Circumference --      Peak Flow --      Pain Score 02/16/19 0916 0     Pain Loc --      Pain Edu? --      Excl. in [place]? --      Physical Exam Vitals signs and nursing note reviewed.  Constitutional:      General: She is  not in acute distress.    Appearance: She is well-developed.  HENT:     Head: Normocephalic and atraumatic.  Eyes:     Conjunctiva/sclera: Conjunctivae normal.  Neck:     Musculoskeletal: Neck supple.  Cardiovascular:     Rate and Rhythm: Normal rate and regular rhythm.     Heart sounds: Normal heart sounds. No murmur. No friction rub.  Pulmonary:     Effort: Pulmonary effort is normal. No respiratory distress.     Breath sounds: Normal breath sounds. No wheezing or  rales.  Abdominal:     General: There is no distension.     Palpations: Abdomen is soft.     Tenderness: There is no abdominal tenderness.  Musculoskeletal:     Comments: Moderate TTP over bilateral knees, R>L, with small effusion appreciated on right. Skin erythema but no lacerations. ROM minimally painful. Left ring finger with bruising/contusion to distal phalanx. Crack noted in nail but nail bed is intact, no lacerations.  Skin:    General: Skin is warm.     Capillary Refill: Capillary refill takes less than 2 seconds.  Neurological:     Mental Status: She is alert and oriented to person, place, and time.     Motor: No abnormal muscle tone.      UPPER EXTREMITY EXAM: RIGHT  INSPECTION & PALPATION: Large soft tissue avulsion to distal aspect of right ring finger, with near complete avulsion and loss of the nail bed and germinal matrix. There is exposed underlying muscle/tendon, with no obvious extensor injury. No exposed bone noted.   SENSORY: Sensation is intact to light touch in:  Superficial radial nerve distribution (dorsal first web space) Median nerve distribution (tip of index finger)   Ulnar nerve distribution (tip of small finger)     MOTOR:  + Motor posterior interosseous nerve (thumb IP extension) + Anterior interosseous nerve (thumb IP flexion, index finger DIP flexion) + Radial nerve (wrist extension) + Median nerve (palpable firing thenar mass) + Ulnar nerve (palpable firing of first dorsal  interosseous muscle)  VASCULAR: 2+ radial pulse Brisk capillary refill < 2 sec, fingers warm and well-perfused  TENDONS: Extension intact at PIP and DIP of ring finger Flexion intact at PIP and DIP No swan neck deformity appreciated  ____________________________________________   LABS (all labs ordered are listed, but only abnormal results are displayed)  Labs Reviewed - No data to display  ____________________________________________  EKG: None ________________________________________  RADIOLOGY All imaging, including plain films, CT scans, and ultrasounds, independently reviewed by me, and interpretations confirmed via formal radiology reads.  ED MD interpretation:   Plain films negative for fx Small R knee effusion  Official radiology report(s): Dg Knee Complete 4 Views Left  Result Date: 02/16/2019 CLINICAL DATA:  85 year old female status post fall. Pain. EXAM: LEFT KNEE - COMPLETE 4+ VIEW COMPARISON:  None. FINDINGS: Bone mineralization is within normal limits for age. No evidence of fracture, dislocation, or joint effusion. Joint spaces are normal for age. No discrete soft tissue injury. IMPRESSION: No acute fracture or dislocation identified about the left knee. Electronically Signed   By: Genevie Ann M.D.   On: 02/16/2019 11:50   Dg Knee Complete 4 Views Right  Result Date: 02/16/2019 CLINICAL DATA:  85 year old female status post fall. Pain. EXAM: RIGHT KNEE - COMPLETE 4+ VIEW COMPARISON:  None. FINDINGS: Bone mineralization is within normal limits for age. Possible trace joint effusion on the cross-table lateral view. Patella appears intact. No acute osseous abnormality identified. Joint spaces are normal for age. No discrete soft tissue injury. IMPRESSION: Trace joint effusion but no acute fracture or dislocation identified about the right knee. Electronically Signed   By: Genevie Ann M.D.   On: 02/16/2019 11:51   Dg Hand Complete Left  Result Date: 02/16/2019 CLINICAL  DATA:  85 year old female status post fall.  Pain. EXAM: LEFT HAND - COMPLETE 3+ VIEW COMPARISON:  None. FINDINGS: Bone mineralization is within normal limits for age. There is no evidence of fracture or dislocation. Joint spaces are normal for age.  No discrete soft tissue injury. IMPRESSION: Negative. Electronically Signed   By: Genevie Ann M.D.   On: 02/16/2019 11:49   Dg Hand Complete Right  Result Date: 02/16/2019 CLINICAL DATA:  Multiple recent falls. Laceration to the ring finger. EXAM: RIGHT HAND - COMPLETE 3+ VIEW COMPARISON:  None. FINDINGS: No acute fracture or dislocation is identified. There is evidence of soft tissue injury involving the distal aspect of the ring finger including the fingernail with several punctate radiodensities within the regional soft tissues or along the skin surface which may reflect foreign bodies. IMPRESSION: No acute osseous abnormality identified. Soft tissue injury to the distal aspect of the ring finger as above. Electronically Signed   By: Logan Bores M.D.   On: 02/16/2019 11:17    ____________________________________________  PROCEDURES   Procedure(s) performed (including Critical Care):  Marland KitchenMarland KitchenLaceration Repair  Date/Time: 02/16/2019 7:39 PM Performed by: Duffy Bruce, MD Authorized by: Duffy Bruce, MD   Consent:    Consent obtained:  Verbal   Consent given by:  Patient   Risks discussed:  Infection, need for additional repair, pain, tendon damage, retained foreign body, vascular damage, poor cosmetic result, poor wound healing and nerve damage   Alternatives discussed:  Referral and delayed treatment Anesthesia (see MAR for exact dosages):    Anesthesia method:  Nerve block   Block needle gauge:  27 G   Block anesthetic:  Bupivacaine 0.5% w/o epi   Block technique:  Digital block   Block injection procedure:  Anatomic landmarks identified, introduced needle, negative aspiration for blood, incremental injection and anatomic landmarks palpated    Block outcome:  Anesthesia achieved Laceration details:    Location: Right ring finger.   Length (cm):  2.5 Repair type:    Repair type:  Intermediate Pre-procedure details:    Preparation:  Patient was prepped and draped in usual sterile fashion and imaging obtained to evaluate for foreign bodies Exploration:    Hemostasis achieved with:  Direct pressure   Wound exploration: wound explored through full range of motion and entire depth of wound probed and visualized     Wound extent: muscle damage     Wound extent: no vascular damage noted   Treatment:    Area cleansed with:  Betadine   Amount of cleaning:  Extensive   Irrigation solution:  Sterile water   Irrigation method:  Pressure wash Skin repair:    Repair method:  Sutures   Suture size:  4-0   Suture material:  Prolene   Suture technique:  Simple interrupted   Number of sutures:  5 Approximation:    Approximation:  Close Post-procedure details:    Dressing:  Non-adherent dressing   Patient tolerance of procedure:  Tolerated well, no immediate complications Comments:     The entire digit was thoroughly cleansed as above, following anesthesia with digital block. On exam, there was noted to be a large tissue deficit with complete loss/avulsion of the nail matrix. I discussed this with the patient, who expressed understanding that her nail would not grow back or survive and preferred attempted outpatient management at this time. The remaining nail was sutured in place to help splint the macerated nail bed, following removal and cleaning of the bed. The dorsal skin was then loosely pulled together with prolene sutures, though due to a large tissue defect there was no ability to fully close the wound.    ____________________________________________  INITIAL IMPRESSION / MDM / ASSESSMENT AND PLAN / ED COURSE  As part  of my medical decision making, I reviewed the following data within the [place] Notes from  prior ED visits and [place] Controlled Substance Database      *SHAMILA LERCH was evaluated in Emergency Department on 02/16/2019 for the symptoms described in the history of present illness. She was evaluated in the context of the global COVID-19 pandemic, which necessitated consideration that the patient might be at risk for infection with the SARS-CoV-2 virus that causes COVID-19. Institutional protocols and algorithms that pertain to the evaluation of patients at risk for COVID-19 are in a state of rapid change based on information released by regulatory bodies including the CDC and federal and state organizations. These policies and algorithms were followed during the patient's care in the ED.  Some ED evaluations and interventions may be delayed as a result of limited staffing during the pandemic.*      Medical Decision Making: 85 yo F here with nail bed injury to R ring finger, multiple contusions due to mechanical fall. No head injury or neck pain. Plain films neg for fx. Pt was in her usual state of health prior to fall. I had a long discussion with pt re: her nail bed injury, and unfortunately I do not thing the nail bed is salvageable given complete lack of tissue here due to primarily avulsion type injury. There is no obvious extensor injury though I would not be surprised if the extensor sheath is damaged based on location/size of tissue deficits. Pt understands and would like toa ttempt to manage without surgical intervention or referral at this time, understanding that there is a risk of poor wound healing, infection, and loss of the finger. The wound was thoroughly cleansed and splinted as above, then dressed with a sterile dressing. I will have her f/u with PCP in 2-3 days for wound check and referral to hand surgeon.  ____________________________________________  FINAL CLINICAL IMPRESSION(S) / ED DIAGNOSES  Final diagnoses:  Laceration of right ring finger without foreign body with damage  to nail, initial encounter  Fall, initial encounter     MEDICATIONS GIVEN DURING THIS VISIT:  Medications  bupivacaine (MARCAINE) 0.5 % injection 10 mL (10 mLs Infiltration Given by Other 02/16/19 1137)  Tdap (BOOSTRIX) injection 0.5 mL (0.5 mLs Intramuscular Given 02/16/19 1435)     ED Discharge Orders         Ordered    cephALEXin (KEFLEX) 500 MG capsule  3 times daily,   Status:  Discontinued     02/16/19 1415    cephALEXin (KEFLEX) 500 MG capsule  3 times daily     02/16/19 1432           Note:  This document was prepared using Dragon voice recognition software and may include unintentional dictation errors.   Duffy Bruce, MD 02/16/19 [phone]

## 2019-02-16 NOTE — TOC Initial Note (Signed)
Transition of Care [facility]) - Initial/Assessment Note    Patient Details  Name: Angela Patterson MRN: [id number] Date of Birth: 1920-09-22  Transition of Care [facility]) CM/SW Contact:    Marshell Garfinkel, RN Phone Number: 02/16/2019, 11:10 AM  Clinical Narrative:                 RNCM met with patient and her daughter [name]. Patient plans to return to home where she states she lives alone without difficulty.  [name] has applied for [facility] however still waiting for callback from PCP office- advised her to call them. PCP is Dr. Nicki Reaper. Patient is open to [facility] for wound. I have updated Cassie with [facility] of patient fall and in the ED. She has walker and cane available but uses a cane at baseline.   Expected Discharge Plan: [place] Barriers to Discharge: No Barriers Identified   Patient Goals and CMS Choice Patient states their goals for this hospitalization and ongoing recovery are:: "I plan to go home" CMS Medicare.gov Compare Post Acute Care list provided to:: Patient Choice offered to / list presented to : Patient, Adult Children  Expected Discharge Plan and Services Expected Discharge Plan: [place]: RN [facility] Agency: [facility] Date [place]: 02/16/19 Time HH Agency Contacted: 1109 Representative spoke with at [place]: Cassie  Prior Living Arrangements/Services   Lives with:: Self   Do you feel safe going back to the place where you live?: Yes        Care giver support system in place?: Yes (comment) Current home services: DME, Home RN([facility]; walker, cane)    Activities of Daily Living      Permission Sought/Granted                  Emotional Assessment Appearance:: Appears stated age     Orientation: : Oriented to Self, Oriented to Place, Oriented to  Time, Oriented to Situation      Admission diagnosis:  fall  Patient Active  Problem List   Diagnosis Date Noted  . Gait abnormality 11/13/2018  . Falls 08/28/2018  . Anxiety 06/22/2018  . Diarrhea 12/13/2017  . Rectal bleeding 07/07/2017  . Foot ulcer ([place]) 04/18/2017  . Open wound of foot excluding toes without complication 76/16/0737  . Health care maintenance 10/27/2014  . Weight loss 12/27/2013  . Thrombocytopenia ([place]) 11/20/2012  . Hypertension 07/21/2012  . Hypercholesterolemia 07/21/2012  . History of CVA (cerebrovascular accident) 07/21/2012  . Diabetes mellitus ([place]) 07/21/2012  . Essential (primary) hypertension 07/21/2012  . Pure hypercholesterolemia 07/21/2012  . Type 2 diabetes mellitus ([place]) 07/21/2012   PCP:  Einar Pheasant, MD Pharmacy:   [facility] [address], [place] [address] Phone: [phone] Fax: [place], [place] [address] [place] [address] Phone: [phone] Fax: [place], [place] [address] [place] [address] Phone: [phone] Fax: [place], [place] [address] Phone: [phone] Fax: [phone]     Social Determinants of  Health (SDOH) Interventions    Readmission Risk Interventions No flowsheet data found.

## 2019-02-16 NOTE — ED Notes (Signed)
Nurse to bedside. Nurse soaked pt's hand in warm water and cleaned hand. Pt tolerated well. Pt has visible damage to 4th finger nail and finger nail bed. Pt was uncomfortable with it in the water. Pt waiting for nerve block

## 2019-02-16 NOTE — ED Notes (Signed)
Dr. Ellender Hose to bedside for nail removal.

## 2019-02-16 NOTE — ED Triage Notes (Signed)
Patient from home via [facility]. Patient was taking trash can out when her knees gave out and she fell. Denies dizziness or LOC. Patient states recent falls over the past week. Patient states she has a walker but doesn't feel like it helps much. Patient has laceration on right fourth finger. Small laceration noted on left hand as well. Abrasions noted on right knee. Patient alert and oriented x4 upon arrival.

## 2019-02-16 NOTE — ED Notes (Signed)
Pt's daughter at bedside. Pt in NAD. Some bleeding noted to dressing on 4th digit on right hand.

## 2019-02-16 NOTE — Discharge Instructions (Addendum)
Try to keep your finger clean and wrapped until healed.  As we discussed, I highly suspect he will lose the current nail and likely will not be able to grow a nail back.  You may also have some issues with extending her finger.  For now, the treatment will be antibiotics and local wound care.  You should follow-up with your doctor in 3 to 5 days, and may ultimately need referral to a hand surgeon if needed.  After the initial dressing for 24 hours, you can apply a dry, sterile dressing once a day.

## 2019-02-16 NOTE — ED Notes (Signed)
Nurse wrapped pt's 4th digit with 4x4 and kelex and Coban. Nurse helped pt to restroom.

## 2019-02-17 DIAGNOSIS — Z0279 Encounter for issue of other medical certificate: Secondary | ICD-10-CM

## 2019-02-19 NOTE — Telephone Encounter (Signed)
Form completed and placed in box.  

## 2019-02-20 ENCOUNTER — Telehealth: Payer: Self-pay | Admitting: Internal Medicine

## 2019-02-20 ENCOUNTER — Encounter: Payer: Self-pay | Admitting: Internal Medicine

## 2019-02-20 ENCOUNTER — Other Ambulatory Visit: Payer: Self-pay

## 2019-02-20 ENCOUNTER — Ambulatory Visit (INDEPENDENT_AMBULATORY_CARE_PROVIDER_SITE_OTHER): Payer: Medicare Other | Admitting: Internal Medicine

## 2019-02-20 VITALS — BP 138/70 | HR 74 | Temp 98.1°F | Ht 65.0 in | Wt 115.0 lb

## 2019-02-20 DIAGNOSIS — S61314D Laceration without foreign body of right ring finger with damage to nail, subsequent encounter: Secondary | ICD-10-CM | POA: Diagnosis not present

## 2019-02-20 DIAGNOSIS — W19XXXD Unspecified fall, subsequent encounter: Secondary | ICD-10-CM | POA: Diagnosis not present

## 2019-02-20 DIAGNOSIS — I70245 Atherosclerosis of native arteries of left leg with ulceration of other part of foot: Secondary | ICD-10-CM | POA: Diagnosis not present

## 2019-02-20 DIAGNOSIS — E1140 Type 2 diabetes mellitus with diabetic neuropathy, unspecified: Secondary | ICD-10-CM | POA: Diagnosis not present

## 2019-02-20 DIAGNOSIS — I10 Essential (primary) hypertension: Secondary | ICD-10-CM

## 2019-02-20 NOTE — Progress Notes (Signed)
Patient ID: Angela Patterson, female   DOB: 07-28-1920, 85 y.o.   MRN: [id number]   Subjective:    Patient ID: Angela Patterson, female    DOB: May 22, 1921, 85 y.o.   MRN: [id number]  HPI  Patient here for an ER follow up.  She is accompanied by her daughter Angela Patterson).  History obtained from both of them.  She was seen in ER 02/16/19 with laceration to right ring finger.  She was taking her trash out and her "feet got caught up".  [address] and landed on her knees.  Injured her finger.  Did not hit her head.  Sustained a laceration/avulsion - finger.  Xrays - knees and hands - no acute bony abnormality.  Sutures placed.  Here for f/u.  Has kept bandage in place.  Other than her finger, she reports she is doing ok. No headache.  No light headedness or dizziness.  No chest pain or sob.  Eating.  No vomiting.  Daughter concerned - states pt seems a little more unsteady.  Discussed using a walking.  Pt and daughter in agreement.  Request three wheeled walker due to size of bathroom, etc.  Discussed importance of slow position changes and movements.  Reports some discomfort in her finger.  Unable to flex finger fully.     Past Medical History:  Diagnosis Date  . Allergy   . Chicken pox   . CVA (cerebral vascular accident) ([place])   . Diabetes mellitus ([place])   . Hypercholesterolemia   . Hypertension    Past Surgical History:  Procedure Laterality Date  . ABDOMINAL HYSTERECTOMY  1971   excessive bleeding  . APPENDECTOMY  age 76  . BREAST LUMPECTOMY  1958   benign  . DILATION AND CURETTAGE OF UTERUS  1970   Family History  Problem Relation Age of Onset  . Liver cancer Father   . Stroke Mother   . Heart attack Brother   . Hypertension Daughter   . Hypertension Son   . Hypertension Daughter   . Cancer Grandchild        breast  . Diabetes Grandchild   . Breast cancer Neg Hx   . Colon cancer Neg Hx    Social History   Socioeconomic History  . Marital status: Widowed    Spouse name: Not on file  .  Number of children: 3  . Years of education: Not on file  . Highest education level: Not on file  Occupational History  . Not on file  Social Needs  . Financial resource strain: Not hard at all  . Food insecurity    Worry: Never true    Inability: Never true  . Transportation needs    Medical: No    Non-medical: No  Tobacco Use  . Smoking status: Never Smoker  . Smokeless tobacco: Never Used  Substance and Sexual Activity  . Alcohol use: No    Alcohol/week: 0.0 standard drinks  . Drug use: No  . Sexual activity: Not on file  Lifestyle  . Physical activity    Days per week: Not on file    Minutes per session: Not on file  . Stress: Not on file  Relationships  . Social Herbalist on phone: Not on file    Gets together: Not on file    Attends religious service: Not on file    Active member of club or organization: Not on file    Attends meetings of clubs or organizations:  Not on file    Relationship status: Not on file  Other Topics Concern  . Not on file  Social History Narrative  . Not on file    Outpatient Encounter Medications as of 02/20/2019  Medication Sig  . amLODipine (NORVASC) 5 MG tablet Take 1 tablet (5 mg total) by mouth 2 (two) times daily.  . cephALEXin (KEFLEX) 500 MG capsule Take 1 capsule (500 mg total) by mouth 3 (three) times daily for 7 days.  . Cholecalciferol (VITAMIN D) 2000 units tablet Take by mouth.  . clopidogrel (PLAVIX) 75 MG tablet Take 1 tablet (75 mg total) by mouth daily.  Marland Kitchen gentamicin cream (GARAMYCIN) 0.1 % Apply 1 application topically 3 (three) times daily.  Marland Kitchen glucose blood test strip USE TO CHECK BLOOD SUGAR  TWO TIMES DAILY  . hydrocortisone (PROCTOCORT) 1 % CREA Apply thin layer around rectal opening 2 times per day to help hemorrhoids  . Lancet Devices (ONE TOUCH DELICA LANCING DEV) MISC Use twice daily Dx: 250.00  . metFORMIN (GLUCOPHAGE-XR) 500 MG 24 hr tablet TAKE 1 TABLET BY MOUTH TWO  TIMES DAILY BEFORE A MEAL.  .  Multiple Vitamin (MULTIVITAMIN) tablet Take 1 tablet by mouth daily.  . mupirocin cream (BACTROBAN) 2 % Apply topically.  . Omega-3 1000 MG CAPS Take by mouth.  . Probiotic Product (PROBIOTIC DAILY PO) Take 1 tablet by mouth daily.  . sertraline (ZOLOFT) 25 MG tablet Take 1 tablet (25 mg total) by mouth daily.  . timolol (BETIMOL) 0.5 % ophthalmic solution 1 drop 2 (two) times daily.  . timolol (TIMOPTIC) 0.5 % ophthalmic solution    No facility-administered encounter medications on file as of 02/20/2019.     Review of Systems  Constitutional: Negative for appetite change and unexpected weight change.  HENT: Negative for congestion and sinus pressure.   Respiratory: Negative for cough, chest tightness and shortness of breath.   Cardiovascular: Negative for chest pain, palpitations and leg swelling.  Gastrointestinal: Negative for abdominal pain, diarrhea, nausea and vomiting.  Genitourinary: Negative for difficulty urinating and dysuria.  Musculoskeletal: Negative for myalgias.       Laceration to right ring finger.   Skin: Negative for color change and rash.  Neurological: Negative for dizziness and headaches.  Psychiatric/Behavioral: Negative for agitation and dysphoric mood.       Objective:    Physical Exam Constitutional:      General: She is not in acute distress.    Appearance: Normal appearance.  Eyes:     General: No scleral icterus.       Right eye: No discharge.        Left eye: No discharge.     Conjunctiva/sclera: Conjunctivae normal.  Neck:     Musculoskeletal: Neck supple. No muscular tenderness.     Thyroid: No thyromegaly.  Cardiovascular:     Rate and Rhythm: Normal rate and regular rhythm.  Pulmonary:     Effort: No respiratory distress.     Breath sounds: Normal breath sounds. No wheezing.  Abdominal:     General: Bowel sounds are normal.     Palpations: Abdomen is soft.     Tenderness: There is no abdominal tenderness.  Musculoskeletal:      Comments: Laceration/avulsion - right ring finger.  Open lesion - involving nail bed.  No surrounding erythema.  Bruising note.  Some increased soft tissue swelling.    Lymphadenopathy:     Cervical: No cervical adenopathy.  Skin:    Findings: No erythema  or rash.  Neurological:     Mental Status: She is alert.  Psychiatric:        Mood and Affect: Mood normal.        Behavior: Behavior normal.     BP 138/70   Pulse 74   Temp 98.1 F (36.7 C) (Oral)   Ht 5\' 5"  (1.651 m)   Wt 115 lb (52.2 kg)   LMP 07/21/1966   SpO2 91%   BMI 19.14 kg/m  Wt Readings from Last 3 Encounters:  02/20/19 115 lb (52.2 kg)  02/16/19 110 lb (49.9 kg)  08/23/18 111 lb (50.3 kg)     Lab Results  Component Value Date   WBC 8.3 07/06/2018   HGB 14.6 07/06/2018   HCT 43.4 07/06/2018   PLT 160.0 07/06/2018   GLUCOSE 226 (H) 11/03/2018   CHOL 199 11/03/2018   TRIG 207.0 (H) 11/03/2018   HDL 47.20 11/03/2018   LDLDIRECT 119.0 11/03/2018   LDLCALC 101 (H) 06/22/2018   ALT 15 11/03/2018   AST 18 11/03/2018   NA 136 11/03/2018   K 4.7 11/03/2018   CL 97 11/03/2018   CREATININE 0.64 11/03/2018   BUN 16 11/03/2018   CO2 28 11/03/2018   TSH 2.55 06/22/2018   HGBA1C 8.3 (H) 11/03/2018   MICROALBUR 4.3 (H) 06/22/2018    Dg Knee Complete 4 Views Left  Result Date: 02/16/2019 CLINICAL DATA:  85 year old female status post fall. Pain. EXAM: LEFT KNEE - COMPLETE 4+ VIEW COMPARISON:  None. FINDINGS: Bone mineralization is within normal limits for age. No evidence of fracture, dislocation, or joint effusion. Joint spaces are normal for age. No discrete soft tissue injury. IMPRESSION: No acute fracture or dislocation identified about the left knee. Electronically Signed   By: Genevie Ann M.D.   On: 02/16/2019 11:50   Dg Knee Complete 4 Views Right  Result Date: 02/16/2019 CLINICAL DATA:  85 year old female status post fall. Pain. EXAM: RIGHT KNEE - COMPLETE 4+ VIEW COMPARISON:  None. FINDINGS: Bone  mineralization is within normal limits for age. Possible trace joint effusion on the cross-table lateral view. Patella appears intact. No acute osseous abnormality identified. Joint spaces are normal for age. No discrete soft tissue injury. IMPRESSION: Trace joint effusion but no acute fracture or dislocation identified about the right knee. Electronically Signed   By: Genevie Ann M.D.   On: 02/16/2019 11:51   Dg Hand Complete Left  Result Date: 02/16/2019 CLINICAL DATA:  85 year old female status post fall.  Pain. EXAM: LEFT HAND - COMPLETE 3+ VIEW COMPARISON:  None. FINDINGS: Bone mineralization is within normal limits for age. There is no evidence of fracture or dislocation. Joint spaces are normal for age. No discrete soft tissue injury. IMPRESSION: Negative. Electronically Signed   By: Genevie Ann M.D.   On: 02/16/2019 11:49   Dg Hand Complete Right  Result Date: 02/16/2019 CLINICAL DATA:  Multiple recent falls. Laceration to the ring finger. EXAM: RIGHT HAND - COMPLETE 3+ VIEW COMPARISON:  None. FINDINGS: No acute fracture or dislocation is identified. There is evidence of soft tissue injury involving the distal aspect of the ring finger including the fingernail with several punctate radiodensities within the regional soft tissues or along the skin surface which may reflect foreign bodies. IMPRESSION: No acute osseous abnormality identified. Soft tissue injury to the distal aspect of the ring finger as above. Electronically Signed   By: Logan Bores M.D.   On: 02/16/2019 11:17       Assessment &  Plan:   Problem List Items Addressed This Visit    Diabetes mellitus ([place])    Sugars have been relatively stable.  Follow.        Falls    Recent fall as outlined.  Discussed importance of slow position changes and movements.  Per daughter - she is planning to start 24/7 care for pt. rx given for walker.  Discussed with pt regarding need for walker.         Finger laceration - Primary    Finger  laceration as outlined.  Sutures in place.  Open lesion with involvement - nail bed.  Area cleaned.  Bandaged removed and redressed.  Refer to ortho - hand specialist. Pt in agreement.        Relevant Orders   Ambulatory referral to Orthopedic Surgery   Hypertension    Blood pressure has been under reasonable control.  Follow.  Same medication regimen.            Einar Pheasant, MD

## 2019-02-20 NOTE — Telephone Encounter (Deleted)
Patient daughter Angela Patterson is asking for FMLA to stay and care for mother.

## 2019-02-20 NOTE — Telephone Encounter (Signed)
Form given to patient daughter today by Dr. Nicki Reaper.

## 2019-02-20 NOTE — Progress Notes (Signed)
Pre visit review using our clinic review tool, if applicable. No additional management support is needed unless otherwise documented below in the visit note. 

## 2019-02-21 ENCOUNTER — Encounter: Payer: Self-pay | Admitting: Podiatry

## 2019-02-21 ENCOUNTER — Encounter: Payer: Self-pay | Admitting: Internal Medicine

## 2019-02-21 ENCOUNTER — Other Ambulatory Visit: Payer: Self-pay

## 2019-02-21 ENCOUNTER — Ambulatory Visit (INDEPENDENT_AMBULATORY_CARE_PROVIDER_SITE_OTHER): Payer: Medicare Other | Admitting: Podiatry

## 2019-02-21 VITALS — Temp 98.7°F

## 2019-02-21 DIAGNOSIS — L97522 Non-pressure chronic ulcer of other part of left foot with fat layer exposed: Secondary | ICD-10-CM

## 2019-02-21 DIAGNOSIS — S61219A Laceration without foreign body of unspecified finger without damage to nail, initial encounter: Secondary | ICD-10-CM | POA: Insufficient documentation

## 2019-02-21 DIAGNOSIS — E0843 Diabetes mellitus due to underlying condition with diabetic autonomic (poly)neuropathy: Secondary | ICD-10-CM

## 2019-02-21 NOTE — Assessment & Plan Note (Signed)
Blood pressure has been under reasonable control.  Follow.  Same medication regimen.

## 2019-02-21 NOTE — Assessment & Plan Note (Signed)
Recent fall as outlined.  Discussed importance of slow position changes and movements.  Per daughter - she is planning to start 24/7 care for pt. rx given for walker.  Discussed with pt regarding need for walker.

## 2019-02-21 NOTE — Assessment & Plan Note (Signed)
Sugars have been relatively stable.  Follow.

## 2019-02-21 NOTE — Assessment & Plan Note (Signed)
Finger laceration as outlined.  Sutures in place.  Open lesion with involvement - nail bed.  Area cleaned.  Bandaged removed and redressed.  Refer to ortho - hand specialist. Pt in agreement.

## 2019-02-23 DIAGNOSIS — E1140 Type 2 diabetes mellitus with diabetic neuropathy, unspecified: Secondary | ICD-10-CM | POA: Diagnosis not present

## 2019-02-23 DIAGNOSIS — S61324A Laceration with foreign body of right ring finger with damage to nail, initial encounter: Secondary | ICD-10-CM | POA: Diagnosis not present

## 2019-02-23 DIAGNOSIS — L97421 Non-pressure chronic ulcer of left heel and midfoot limited to breakdown of skin: Secondary | ICD-10-CM | POA: Diagnosis not present

## 2019-02-23 DIAGNOSIS — I10 Essential (primary) hypertension: Secondary | ICD-10-CM | POA: Diagnosis not present

## 2019-02-23 DIAGNOSIS — F419 Anxiety disorder, unspecified: Secondary | ICD-10-CM | POA: Diagnosis not present

## 2019-02-23 DIAGNOSIS — E11621 Type 2 diabetes mellitus with foot ulcer: Secondary | ICD-10-CM | POA: Diagnosis not present

## 2019-02-23 DIAGNOSIS — Z7984 Long term (current) use of oral hypoglycemic drugs: Secondary | ICD-10-CM | POA: Diagnosis not present

## 2019-02-23 NOTE — Progress Notes (Signed)
   Subjective:  85 year old female with PMHx of DM presenting today for follow up evaluation of an ulceration of the left foot and left anterior leg. She states the wound on the left foot is about the same. She has been applying Aquacel Ag as directed. She denies worsening factors. Patient is here for further evaluation and treatment.   Past Medical History:  Diagnosis Date  . Allergy   . Chicken pox   . CVA (cerebral vascular accident) ([place])   . Diabetes mellitus ([place])   . Hypercholesterolemia   . Hypertension       Objective/Physical Exam General: The patient is alert and oriented x3 in no acute distress.  Dermatology:  Wound #1 noted to the sub-first MPJ of the left foot measuring 1.0 x 1.0 x 0.2 cm (LxWxD).     To the noted ulceration(s), there is no eschar. There is a moderate amount of slough, fibrin, and necrotic tissue noted. Granulation tissue and wound base is red. There is a minimal amount of serosanguineous drainage noted. There is no exposed bone muscle-tendon ligament or joint. There is no malodor. Periwound integrity is intact. Wound noted to the left anterior leg has healed. Complete re-epithelialization has occurred. No drainage noted.  Skin is warm, dry and supple bilateral lower extremities.  Vascular: Palpable pedal pulses bilaterally. No edema or erythema noted. Capillary refill within normal limits.  Neurological: Epicritic and protective threshold diminished bilaterally.   Musculoskeletal Exam: Range of motion within normal limits to all pedal and ankle joints bilateral. Muscle strength 5/5 in all groups bilateral.   Assessment: #1 ulceration of the sub-first MPJ of the left foot secondary to diabetes mellitus #2 diabetes mellitus w/ peripheral neuropathy #3 ulceration of the left anterior leg - healed    Plan of Care:  #1 Patient was evaluated. #2 Medically necessary excisional debridement including subcutaneous tissue was performed using a tissue  nipper and a chisel blade. Excisional debridement of all the necrotic nonviable tissue down to healthy bleeding viable tissue was performed with post-debridement measurements same as pre-. #3 The wound was cleansed and dry sterile dressing applied. #4 Continue using Aquacel Ag and a dry sterile dressing to left foot ulcer daily.  #5 Continue wearing DM shoes and insoles.  #6 Return to clinic in 3 weeks.     [name], DPM [facility]  Dr. [name], [address]                                        [place], [place] [address]                Office [phone]  Fax [phone]

## 2019-03-01 DIAGNOSIS — E1140 Type 2 diabetes mellitus with diabetic neuropathy, unspecified: Secondary | ICD-10-CM | POA: Diagnosis not present

## 2019-03-01 DIAGNOSIS — E11621 Type 2 diabetes mellitus with foot ulcer: Secondary | ICD-10-CM | POA: Diagnosis not present

## 2019-03-01 DIAGNOSIS — L97421 Non-pressure chronic ulcer of left heel and midfoot limited to breakdown of skin: Secondary | ICD-10-CM | POA: Diagnosis not present

## 2019-03-01 DIAGNOSIS — Z7984 Long term (current) use of oral hypoglycemic drugs: Secondary | ICD-10-CM | POA: Diagnosis not present

## 2019-03-01 DIAGNOSIS — I10 Essential (primary) hypertension: Secondary | ICD-10-CM | POA: Diagnosis not present

## 2019-03-01 DIAGNOSIS — F419 Anxiety disorder, unspecified: Secondary | ICD-10-CM | POA: Diagnosis not present

## 2019-03-02 DIAGNOSIS — S61324A Laceration with foreign body of right ring finger with damage to nail, initial encounter: Secondary | ICD-10-CM | POA: Diagnosis not present

## 2019-03-03 DIAGNOSIS — L97421 Non-pressure chronic ulcer of left heel and midfoot limited to breakdown of skin: Secondary | ICD-10-CM | POA: Diagnosis not present

## 2019-03-03 DIAGNOSIS — I10 Essential (primary) hypertension: Secondary | ICD-10-CM | POA: Diagnosis not present

## 2019-03-03 DIAGNOSIS — E11621 Type 2 diabetes mellitus with foot ulcer: Secondary | ICD-10-CM | POA: Diagnosis not present

## 2019-03-03 DIAGNOSIS — F419 Anxiety disorder, unspecified: Secondary | ICD-10-CM | POA: Diagnosis not present

## 2019-03-03 DIAGNOSIS — Z7984 Long term (current) use of oral hypoglycemic drugs: Secondary | ICD-10-CM | POA: Diagnosis not present

## 2019-03-03 DIAGNOSIS — E1140 Type 2 diabetes mellitus with diabetic neuropathy, unspecified: Secondary | ICD-10-CM | POA: Diagnosis not present

## 2019-03-06 DIAGNOSIS — E1140 Type 2 diabetes mellitus with diabetic neuropathy, unspecified: Secondary | ICD-10-CM | POA: Diagnosis not present

## 2019-03-06 DIAGNOSIS — Z7984 Long term (current) use of oral hypoglycemic drugs: Secondary | ICD-10-CM | POA: Diagnosis not present

## 2019-03-06 DIAGNOSIS — I10 Essential (primary) hypertension: Secondary | ICD-10-CM | POA: Diagnosis not present

## 2019-03-06 DIAGNOSIS — L97421 Non-pressure chronic ulcer of left heel and midfoot limited to breakdown of skin: Secondary | ICD-10-CM | POA: Diagnosis not present

## 2019-03-06 DIAGNOSIS — E11621 Type 2 diabetes mellitus with foot ulcer: Secondary | ICD-10-CM | POA: Diagnosis not present

## 2019-03-06 DIAGNOSIS — F419 Anxiety disorder, unspecified: Secondary | ICD-10-CM | POA: Diagnosis not present

## 2019-03-08 DIAGNOSIS — E11621 Type 2 diabetes mellitus with foot ulcer: Secondary | ICD-10-CM | POA: Diagnosis not present

## 2019-03-08 DIAGNOSIS — L97421 Non-pressure chronic ulcer of left heel and midfoot limited to breakdown of skin: Secondary | ICD-10-CM | POA: Diagnosis not present

## 2019-03-08 DIAGNOSIS — I10 Essential (primary) hypertension: Secondary | ICD-10-CM | POA: Diagnosis not present

## 2019-03-08 DIAGNOSIS — E1140 Type 2 diabetes mellitus with diabetic neuropathy, unspecified: Secondary | ICD-10-CM | POA: Diagnosis not present

## 2019-03-08 DIAGNOSIS — F419 Anxiety disorder, unspecified: Secondary | ICD-10-CM | POA: Diagnosis not present

## 2019-03-08 DIAGNOSIS — Z7984 Long term (current) use of oral hypoglycemic drugs: Secondary | ICD-10-CM | POA: Diagnosis not present

## 2019-03-09 DIAGNOSIS — L97421 Non-pressure chronic ulcer of left heel and midfoot limited to breakdown of skin: Secondary | ICD-10-CM | POA: Diagnosis not present

## 2019-03-09 DIAGNOSIS — E11621 Type 2 diabetes mellitus with foot ulcer: Secondary | ICD-10-CM | POA: Diagnosis not present

## 2019-03-09 DIAGNOSIS — E1140 Type 2 diabetes mellitus with diabetic neuropathy, unspecified: Secondary | ICD-10-CM | POA: Diagnosis not present

## 2019-03-09 DIAGNOSIS — Z7984 Long term (current) use of oral hypoglycemic drugs: Secondary | ICD-10-CM | POA: Diagnosis not present

## 2019-03-09 DIAGNOSIS — I10 Essential (primary) hypertension: Secondary | ICD-10-CM | POA: Diagnosis not present

## 2019-03-09 DIAGNOSIS — F419 Anxiety disorder, unspecified: Secondary | ICD-10-CM | POA: Diagnosis not present

## 2019-03-13 ENCOUNTER — Other Ambulatory Visit: Payer: Self-pay | Admitting: Internal Medicine

## 2019-03-13 DIAGNOSIS — E11621 Type 2 diabetes mellitus with foot ulcer: Secondary | ICD-10-CM | POA: Diagnosis not present

## 2019-03-13 DIAGNOSIS — I10 Essential (primary) hypertension: Secondary | ICD-10-CM | POA: Diagnosis not present

## 2019-03-13 DIAGNOSIS — Z7984 Long term (current) use of oral hypoglycemic drugs: Secondary | ICD-10-CM | POA: Diagnosis not present

## 2019-03-13 DIAGNOSIS — L97421 Non-pressure chronic ulcer of left heel and midfoot limited to breakdown of skin: Secondary | ICD-10-CM | POA: Diagnosis not present

## 2019-03-13 DIAGNOSIS — E1140 Type 2 diabetes mellitus with diabetic neuropathy, unspecified: Secondary | ICD-10-CM | POA: Diagnosis not present

## 2019-03-13 DIAGNOSIS — H401131 Primary open-angle glaucoma, bilateral, mild stage: Secondary | ICD-10-CM | POA: Diagnosis not present

## 2019-03-13 DIAGNOSIS — F419 Anxiety disorder, unspecified: Secondary | ICD-10-CM | POA: Diagnosis not present

## 2019-03-13 LAB — HM DIABETES EYE EXAM

## 2019-03-14 ENCOUNTER — Encounter: Payer: Self-pay | Admitting: Podiatry

## 2019-03-14 ENCOUNTER — Ambulatory Visit (INDEPENDENT_AMBULATORY_CARE_PROVIDER_SITE_OTHER): Payer: Medicare Other | Admitting: Podiatry

## 2019-03-14 ENCOUNTER — Other Ambulatory Visit: Payer: Self-pay

## 2019-03-14 VITALS — Temp 98.9°F

## 2019-03-14 DIAGNOSIS — L97522 Non-pressure chronic ulcer of other part of left foot with fat layer exposed: Secondary | ICD-10-CM

## 2019-03-14 DIAGNOSIS — E0843 Diabetes mellitus due to underlying condition with diabetic autonomic (poly)neuropathy: Secondary | ICD-10-CM

## 2019-03-16 DIAGNOSIS — E1140 Type 2 diabetes mellitus with diabetic neuropathy, unspecified: Secondary | ICD-10-CM | POA: Diagnosis not present

## 2019-03-16 DIAGNOSIS — F419 Anxiety disorder, unspecified: Secondary | ICD-10-CM | POA: Diagnosis not present

## 2019-03-16 DIAGNOSIS — S61324A Laceration with foreign body of right ring finger with damage to nail, initial encounter: Secondary | ICD-10-CM | POA: Diagnosis not present

## 2019-03-16 DIAGNOSIS — E11621 Type 2 diabetes mellitus with foot ulcer: Secondary | ICD-10-CM | POA: Diagnosis not present

## 2019-03-16 DIAGNOSIS — I10 Essential (primary) hypertension: Secondary | ICD-10-CM | POA: Diagnosis not present

## 2019-03-16 DIAGNOSIS — L97421 Non-pressure chronic ulcer of left heel and midfoot limited to breakdown of skin: Secondary | ICD-10-CM | POA: Diagnosis not present

## 2019-03-16 DIAGNOSIS — Z7984 Long term (current) use of oral hypoglycemic drugs: Secondary | ICD-10-CM | POA: Diagnosis not present

## 2019-03-16 NOTE — Progress Notes (Signed)
   Subjective:  85 year old female with PMHx of DM presenting today for follow up evaluation of an ulceration of the left foot. She states the wound is relatively unchanged. She has been Aquacel Ag as directed. She denies modifying factors. Patient is here for further evaluation and treatment.   Past Medical History:  Diagnosis Date  . Allergy   . Chicken pox   . CVA (cerebral vascular accident) ([place])   . Diabetes mellitus ([place])   . Hypercholesterolemia   . Hypertension       Objective/Physical Exam General: The patient is alert and oriented x3 in no acute distress.  Dermatology:  Wound #1 noted to the sub-first MPJ of the left foot measuring 2.5 x 1.5 x 0.3 cm (LxWxD).     To the noted ulceration(s), there is no eschar. There is a moderate amount of slough, fibrin, and necrotic tissue noted. Granulation tissue and wound base is red. There is a minimal amount of serosanguineous drainage noted. There is no exposed bone muscle-tendon ligament or joint. There is no malodor. Periwound integrity is intact. Skin is warm, dry and supple bilateral lower extremities.  Vascular: Palpable pedal pulses bilaterally. No edema or erythema noted. Capillary refill within normal limits.  Neurological: Epicritic and protective threshold diminished bilaterally.   Musculoskeletal Exam: Range of motion within normal limits to all pedal and ankle joints bilateral. Muscle strength 5/5 in all groups bilateral.   Assessment: #1 ulceration of the sub-first MPJ of the left foot secondary to diabetes mellitus #2 diabetes mellitus w/ peripheral neuropathy   Plan of Care:  #1 Patient was evaluated. #2 Medically necessary excisional debridement including subcutaneous tissue was performed using a tissue nipper and a chisel blade. Excisional debridement of all the necrotic nonviable tissue down to healthy bleeding viable tissue was performed with post-debridement measurements same as pre-. #3 The wound was  cleansed and dry sterile dressing applied. #4 Continue using Aquacel Ag and a dry sterile dressing to left foot ulcer daily.  #5 Continue wearing DM shoes and insoles.  #6 Return to clinic in 3 weeks.     [name], DPM [facility]  Dr. [name], [address]                                        [place], [place] [address]                Office [phone]  Fax [phone]

## 2019-03-17 DIAGNOSIS — E1140 Type 2 diabetes mellitus with diabetic neuropathy, unspecified: Secondary | ICD-10-CM | POA: Diagnosis not present

## 2019-03-17 DIAGNOSIS — L97421 Non-pressure chronic ulcer of left heel and midfoot limited to breakdown of skin: Secondary | ICD-10-CM | POA: Diagnosis not present

## 2019-03-17 DIAGNOSIS — M6281 Muscle weakness (generalized): Secondary | ICD-10-CM | POA: Diagnosis not present

## 2019-03-17 DIAGNOSIS — E11621 Type 2 diabetes mellitus with foot ulcer: Secondary | ICD-10-CM | POA: Diagnosis not present

## 2019-03-17 DIAGNOSIS — S61304D Unspecified open wound of right ring finger with damage to nail, subsequent encounter: Secondary | ICD-10-CM | POA: Diagnosis not present

## 2019-03-17 DIAGNOSIS — F419 Anxiety disorder, unspecified: Secondary | ICD-10-CM | POA: Diagnosis not present

## 2019-03-17 DIAGNOSIS — Z8673 Personal history of transient ischemic attack (TIA), and cerebral infarction without residual deficits: Secondary | ICD-10-CM | POA: Diagnosis not present

## 2019-03-17 DIAGNOSIS — Z7984 Long term (current) use of oral hypoglycemic drugs: Secondary | ICD-10-CM | POA: Diagnosis not present

## 2019-03-17 DIAGNOSIS — I10 Essential (primary) hypertension: Secondary | ICD-10-CM | POA: Diagnosis not present

## 2019-03-17 DIAGNOSIS — R2689 Other abnormalities of gait and mobility: Secondary | ICD-10-CM | POA: Diagnosis not present

## 2019-03-22 DIAGNOSIS — I10 Essential (primary) hypertension: Secondary | ICD-10-CM | POA: Diagnosis not present

## 2019-03-22 DIAGNOSIS — L97421 Non-pressure chronic ulcer of left heel and midfoot limited to breakdown of skin: Secondary | ICD-10-CM | POA: Diagnosis not present

## 2019-03-22 DIAGNOSIS — E1140 Type 2 diabetes mellitus with diabetic neuropathy, unspecified: Secondary | ICD-10-CM | POA: Diagnosis not present

## 2019-03-22 DIAGNOSIS — E11621 Type 2 diabetes mellitus with foot ulcer: Secondary | ICD-10-CM | POA: Diagnosis not present

## 2019-03-22 DIAGNOSIS — S61304D Unspecified open wound of right ring finger with damage to nail, subsequent encounter: Secondary | ICD-10-CM | POA: Diagnosis not present

## 2019-03-22 DIAGNOSIS — F419 Anxiety disorder, unspecified: Secondary | ICD-10-CM | POA: Diagnosis not present

## 2019-03-29 ENCOUNTER — Telehealth: Payer: Self-pay | Admitting: Internal Medicine

## 2019-03-29 DIAGNOSIS — F419 Anxiety disorder, unspecified: Secondary | ICD-10-CM | POA: Diagnosis not present

## 2019-03-29 DIAGNOSIS — I10 Essential (primary) hypertension: Secondary | ICD-10-CM | POA: Diagnosis not present

## 2019-03-29 DIAGNOSIS — L97421 Non-pressure chronic ulcer of left heel and midfoot limited to breakdown of skin: Secondary | ICD-10-CM | POA: Diagnosis not present

## 2019-03-29 DIAGNOSIS — E1140 Type 2 diabetes mellitus with diabetic neuropathy, unspecified: Secondary | ICD-10-CM | POA: Diagnosis not present

## 2019-03-29 DIAGNOSIS — E11621 Type 2 diabetes mellitus with foot ulcer: Secondary | ICD-10-CM | POA: Diagnosis not present

## 2019-03-29 DIAGNOSIS — S61304D Unspecified open wound of right ring finger with damage to nail, subsequent encounter: Secondary | ICD-10-CM | POA: Diagnosis not present

## 2019-03-29 NOTE — Telephone Encounter (Signed)
Pamala Hurry with [facility] calling - they have been doing home wound care for pt.  Pt has been periodically taking blood sugar and just showed nurse those results today.  Range over the past two to three weeks is 202-350.  Nothing under 202 for the pt.  Pamala Hurry is unsure if these are fasting or after eating, but wanted to make provider aware.

## 2019-03-30 NOTE — Telephone Encounter (Signed)
Pt has been checking her blood sugar periodically. She has a diabetic ulcer that they are trying to heal and she is also getting meals on wheels now so her diet has changed. Sugars ranging 202-350. Pt is not having any symptoms. Confirmed doing okay. Home health was wondering if you would like to change anything with her metformin. She is currently taking 2 per day.

## 2019-03-30 NOTE — Telephone Encounter (Signed)
What I would recommend first is trying to increase the metformin.  She is currently taking one bid (511m).  Can increase to two bid.  (can start with two in and one in pm and if tolerates, increase to two bid).  Also, appears she is due to have fasting labs.  Please schedule.

## 2019-03-31 NOTE — Telephone Encounter (Signed)
Pt has been scheduled for labs and f/u. Will increase metformin

## 2019-04-04 ENCOUNTER — Other Ambulatory Visit: Payer: Self-pay

## 2019-04-04 ENCOUNTER — Encounter: Payer: Self-pay | Admitting: Podiatry

## 2019-04-04 ENCOUNTER — Ambulatory Visit (INDEPENDENT_AMBULATORY_CARE_PROVIDER_SITE_OTHER): Payer: Medicare Other | Admitting: Podiatry

## 2019-04-04 DIAGNOSIS — L97522 Non-pressure chronic ulcer of other part of left foot with fat layer exposed: Secondary | ICD-10-CM

## 2019-04-04 DIAGNOSIS — E0843 Diabetes mellitus due to underlying condition with diabetic autonomic (poly)neuropathy: Secondary | ICD-10-CM

## 2019-04-05 DIAGNOSIS — E11621 Type 2 diabetes mellitus with foot ulcer: Secondary | ICD-10-CM | POA: Diagnosis not present

## 2019-04-05 DIAGNOSIS — I10 Essential (primary) hypertension: Secondary | ICD-10-CM | POA: Diagnosis not present

## 2019-04-05 DIAGNOSIS — S61304D Unspecified open wound of right ring finger with damage to nail, subsequent encounter: Secondary | ICD-10-CM | POA: Diagnosis not present

## 2019-04-05 DIAGNOSIS — L97421 Non-pressure chronic ulcer of left heel and midfoot limited to breakdown of skin: Secondary | ICD-10-CM | POA: Diagnosis not present

## 2019-04-05 DIAGNOSIS — F419 Anxiety disorder, unspecified: Secondary | ICD-10-CM | POA: Diagnosis not present

## 2019-04-05 DIAGNOSIS — E1140 Type 2 diabetes mellitus with diabetic neuropathy, unspecified: Secondary | ICD-10-CM | POA: Diagnosis not present

## 2019-04-06 DIAGNOSIS — E1140 Type 2 diabetes mellitus with diabetic neuropathy, unspecified: Secondary | ICD-10-CM | POA: Diagnosis not present

## 2019-04-06 DIAGNOSIS — S61304D Unspecified open wound of right ring finger with damage to nail, subsequent encounter: Secondary | ICD-10-CM | POA: Diagnosis not present

## 2019-04-06 DIAGNOSIS — I10 Essential (primary) hypertension: Secondary | ICD-10-CM | POA: Diagnosis not present

## 2019-04-06 DIAGNOSIS — F419 Anxiety disorder, unspecified: Secondary | ICD-10-CM | POA: Diagnosis not present

## 2019-04-06 DIAGNOSIS — E11621 Type 2 diabetes mellitus with foot ulcer: Secondary | ICD-10-CM | POA: Diagnosis not present

## 2019-04-06 DIAGNOSIS — L97421 Non-pressure chronic ulcer of left heel and midfoot limited to breakdown of skin: Secondary | ICD-10-CM | POA: Diagnosis not present

## 2019-04-07 NOTE — Progress Notes (Signed)
   Subjective:  85 year old female with PMHx of DM presenting today for follow up evaluation of an ulceration of the left foot. She states the wound has not improved nor worsened. She is concerned another wound is forming next to the current one. She has been using the Aquacel Ag as directed. There are no worsening factors noted at this time. Patient is here for further evaluation and treatment.   Past Medical History:  Diagnosis Date  . Allergy   . Chicken pox   . CVA (cerebral vascular accident) ([place])   . Diabetes mellitus ([place])   . Hypercholesterolemia   . Hypertension       Objective/Physical Exam General: The patient is alert and oriented x3 in no acute distress.  Dermatology:  Wound #1 noted to the sub-first MPJ of the left foot measuring 0.9 x 0.9 x 0.2 cm (LxWxD).     To the noted ulceration(s), there is no eschar. There is a moderate amount of slough, fibrin, and necrotic tissue noted. Granulation tissue and wound base is red. There is a minimal amount of serosanguineous drainage noted. There is no exposed bone muscle-tendon ligament or joint. There is no malodor. Periwound integrity is intact. Skin is warm, dry and supple bilateral lower extremities.  Vascular: Palpable pedal pulses bilaterally. No edema or erythema noted. Capillary refill within normal limits.  Neurological: Epicritic and protective threshold diminished bilaterally.   Musculoskeletal Exam: Range of motion within normal limits to all pedal and ankle joints bilateral. Muscle strength 5/5 in all groups bilateral.   Assessment: #1 ulceration of the sub-first MPJ of the left foot secondary to diabetes mellitus #2 diabetes mellitus w/ peripheral neuropathy   Plan of Care:  #1 Patient was evaluated. #2 Medically necessary excisional debridement including subcutaneous tissue was performed using a tissue nipper and a chisel blade. Excisional debridement of all the necrotic nonviable tissue down to healthy  bleeding viable tissue was performed with post-debridement measurements same as pre-. #3 The wound was cleansed and dry sterile dressing applied. #4 Continue using Aquacel Ag and a dry sterile dressing to left foot ulcer daily.  #5 Continue wearing DM shoes and insoles.  #6 Return to clinic in 3 weeks.     [name], DPM [facility]  Dr. [name], [address]                                        [place], [place] [address]                Office [phone]  Fax [phone]

## 2019-04-12 DIAGNOSIS — L97421 Non-pressure chronic ulcer of left heel and midfoot limited to breakdown of skin: Secondary | ICD-10-CM | POA: Diagnosis not present

## 2019-04-12 DIAGNOSIS — E1140 Type 2 diabetes mellitus with diabetic neuropathy, unspecified: Secondary | ICD-10-CM | POA: Diagnosis not present

## 2019-04-12 DIAGNOSIS — I10 Essential (primary) hypertension: Secondary | ICD-10-CM | POA: Diagnosis not present

## 2019-04-12 DIAGNOSIS — S61304D Unspecified open wound of right ring finger with damage to nail, subsequent encounter: Secondary | ICD-10-CM | POA: Diagnosis not present

## 2019-04-12 DIAGNOSIS — E11621 Type 2 diabetes mellitus with foot ulcer: Secondary | ICD-10-CM | POA: Diagnosis not present

## 2019-04-12 DIAGNOSIS — F419 Anxiety disorder, unspecified: Secondary | ICD-10-CM | POA: Diagnosis not present

## 2019-04-13 DIAGNOSIS — E11621 Type 2 diabetes mellitus with foot ulcer: Secondary | ICD-10-CM | POA: Diagnosis not present

## 2019-04-13 DIAGNOSIS — S61304D Unspecified open wound of right ring finger with damage to nail, subsequent encounter: Secondary | ICD-10-CM | POA: Diagnosis not present

## 2019-04-13 DIAGNOSIS — E1140 Type 2 diabetes mellitus with diabetic neuropathy, unspecified: Secondary | ICD-10-CM | POA: Diagnosis not present

## 2019-04-13 DIAGNOSIS — L97421 Non-pressure chronic ulcer of left heel and midfoot limited to breakdown of skin: Secondary | ICD-10-CM | POA: Diagnosis not present

## 2019-04-13 DIAGNOSIS — F419 Anxiety disorder, unspecified: Secondary | ICD-10-CM | POA: Diagnosis not present

## 2019-04-13 DIAGNOSIS — I10 Essential (primary) hypertension: Secondary | ICD-10-CM | POA: Diagnosis not present

## 2019-04-14 ENCOUNTER — Telehealth: Payer: Self-pay | Admitting: *Deleted

## 2019-04-14 DIAGNOSIS — E1140 Type 2 diabetes mellitus with diabetic neuropathy, unspecified: Secondary | ICD-10-CM

## 2019-04-14 DIAGNOSIS — I10 Essential (primary) hypertension: Secondary | ICD-10-CM

## 2019-04-14 DIAGNOSIS — E7800 Pure hypercholesterolemia, unspecified: Secondary | ICD-10-CM

## 2019-04-14 NOTE — Telephone Encounter (Signed)
Please place future orders for lab appt.  

## 2019-04-15 ENCOUNTER — Encounter: Payer: Self-pay | Admitting: Internal Medicine

## 2019-04-15 NOTE — Telephone Encounter (Signed)
Orders placed for f/u labs.  

## 2019-04-16 DIAGNOSIS — F419 Anxiety disorder, unspecified: Secondary | ICD-10-CM | POA: Diagnosis not present

## 2019-04-16 DIAGNOSIS — E11621 Type 2 diabetes mellitus with foot ulcer: Secondary | ICD-10-CM | POA: Diagnosis not present

## 2019-04-16 DIAGNOSIS — I10 Essential (primary) hypertension: Secondary | ICD-10-CM | POA: Diagnosis not present

## 2019-04-16 DIAGNOSIS — S61304D Unspecified open wound of right ring finger with damage to nail, subsequent encounter: Secondary | ICD-10-CM | POA: Diagnosis not present

## 2019-04-16 DIAGNOSIS — Z8673 Personal history of transient ischemic attack (TIA), and cerebral infarction without residual deficits: Secondary | ICD-10-CM | POA: Diagnosis not present

## 2019-04-16 DIAGNOSIS — L97421 Non-pressure chronic ulcer of left heel and midfoot limited to breakdown of skin: Secondary | ICD-10-CM | POA: Diagnosis not present

## 2019-04-16 DIAGNOSIS — R2689 Other abnormalities of gait and mobility: Secondary | ICD-10-CM | POA: Diagnosis not present

## 2019-04-16 DIAGNOSIS — M6281 Muscle weakness (generalized): Secondary | ICD-10-CM | POA: Diagnosis not present

## 2019-04-16 DIAGNOSIS — E1140 Type 2 diabetes mellitus with diabetic neuropathy, unspecified: Secondary | ICD-10-CM | POA: Diagnosis not present

## 2019-04-16 DIAGNOSIS — Z7984 Long term (current) use of oral hypoglycemic drugs: Secondary | ICD-10-CM | POA: Diagnosis not present

## 2019-04-17 ENCOUNTER — Other Ambulatory Visit (INDEPENDENT_AMBULATORY_CARE_PROVIDER_SITE_OTHER): Payer: Medicare Other

## 2019-04-17 ENCOUNTER — Other Ambulatory Visit: Payer: Self-pay

## 2019-04-17 DIAGNOSIS — E1140 Type 2 diabetes mellitus with diabetic neuropathy, unspecified: Secondary | ICD-10-CM | POA: Diagnosis not present

## 2019-04-17 DIAGNOSIS — E7800 Pure hypercholesterolemia, unspecified: Secondary | ICD-10-CM

## 2019-04-17 DIAGNOSIS — I10 Essential (primary) hypertension: Secondary | ICD-10-CM

## 2019-04-17 LAB — LIPID PANEL
Cholesterol: 164 mg/dL (ref 0–200)
HDL: 42.9 mg/dL (ref >39.00)
LDL Cholesterol: 87 mg/dL (ref 0–99)
NonHDL: 120.94
Total CHOL/HDL Ratio: 4
Triglycerides: 169 mg/dL — ABNORMAL HIGH (ref 0.0–149.0)
VLDL: 33.8 mg/dL (ref 0.0–40.0)

## 2019-04-17 LAB — CBC WITH DIFFERENTIAL/PLATELET
Basophils Absolute: 0.1 10*3/uL (ref 0.0–0.1)
Basophils Relative: 0.8 % (ref 0.0–3.0)
Eosinophils Absolute: 0.2 10*3/uL (ref 0.0–0.7)
Eosinophils Relative: 2.1 % (ref 0.0–5.0)
HCT: 42.2 % (ref 36.0–46.0)
Hemoglobin: 14.1 g/dL (ref 12.0–15.0)
Lymphocytes Relative: 22.3 % (ref 12.0–46.0)
Lymphs Abs: 1.8 10*3/uL (ref 0.7–4.0)
MCHC: 33.4 g/dL (ref 30.0–36.0)
MCV: 93.6 fl (ref 78.0–100.0)
Monocytes Absolute: 0.7 10*3/uL (ref 0.1–1.0)
Monocytes Relative: 9.2 % (ref 3.0–12.0)
Neutro Abs: 5.3 10*3/uL (ref 1.4–7.7)
Neutrophils Relative %: 65.6 % (ref 43.0–77.0)
Platelets: 140 10*3/uL — ABNORMAL LOW (ref 150.0–400.0)
RBC: 4.51 Mil/uL (ref 3.87–5.11)
RDW: 13 % (ref 11.5–15.5)
WBC: 8.1 10*3/uL (ref 4.0–10.5)

## 2019-04-17 LAB — BASIC METABOLIC PANEL
BUN: 15 mg/dL (ref 6–23)
CO2: 28 mEq/L (ref 19–32)
Calcium: 9.6 mg/dL (ref 8.4–10.5)
Chloride: 102 mEq/L (ref 96–112)
Creatinine, Ser: 0.63 mg/dL (ref 0.40–1.20)
GFR: 87.6 mL/min (ref >60.00)
Glucose, Bld: 183 mg/dL — ABNORMAL HIGH (ref 70–99)
Potassium: 4.3 mEq/L (ref 3.5–5.1)
Sodium: 140 mEq/L (ref 135–145)

## 2019-04-17 LAB — HEPATIC FUNCTION PANEL
ALT: 10 U/L (ref 0–35)
AST: 12 U/L (ref 0–37)
Albumin: 4.2 g/dL (ref 3.5–5.2)
Alkaline Phosphatase: 66 U/L (ref 39–117)
Bilirubin, Direct: 0.1 mg/dL (ref 0.0–0.3)
Total Bilirubin: 0.8 mg/dL (ref 0.2–1.2)
Total Protein: 7.4 g/dL (ref 6.0–8.3)

## 2019-04-17 LAB — TSH: TSH: 3.04 u[IU]/mL (ref 0.35–4.50)

## 2019-04-17 LAB — HEMOGLOBIN A1C: Hgb A1c MFr Bld: 8.4 % — ABNORMAL HIGH (ref 4.6–6.5)

## 2019-04-19 ENCOUNTER — Other Ambulatory Visit: Payer: Self-pay

## 2019-04-19 ENCOUNTER — Ambulatory Visit (INDEPENDENT_AMBULATORY_CARE_PROVIDER_SITE_OTHER): Payer: Medicare Other

## 2019-04-19 DIAGNOSIS — F419 Anxiety disorder, unspecified: Secondary | ICD-10-CM | POA: Diagnosis not present

## 2019-04-19 DIAGNOSIS — L97421 Non-pressure chronic ulcer of left heel and midfoot limited to breakdown of skin: Secondary | ICD-10-CM | POA: Diagnosis not present

## 2019-04-19 DIAGNOSIS — Z23 Encounter for immunization: Secondary | ICD-10-CM | POA: Diagnosis not present

## 2019-04-19 DIAGNOSIS — E11621 Type 2 diabetes mellitus with foot ulcer: Secondary | ICD-10-CM | POA: Diagnosis not present

## 2019-04-19 DIAGNOSIS — I10 Essential (primary) hypertension: Secondary | ICD-10-CM | POA: Diagnosis not present

## 2019-04-19 DIAGNOSIS — S61304D Unspecified open wound of right ring finger with damage to nail, subsequent encounter: Secondary | ICD-10-CM | POA: Diagnosis not present

## 2019-04-19 DIAGNOSIS — E1140 Type 2 diabetes mellitus with diabetic neuropathy, unspecified: Secondary | ICD-10-CM | POA: Diagnosis not present

## 2019-04-20 DIAGNOSIS — F419 Anxiety disorder, unspecified: Secondary | ICD-10-CM | POA: Diagnosis not present

## 2019-04-20 DIAGNOSIS — E11621 Type 2 diabetes mellitus with foot ulcer: Secondary | ICD-10-CM | POA: Diagnosis not present

## 2019-04-20 DIAGNOSIS — E1140 Type 2 diabetes mellitus with diabetic neuropathy, unspecified: Secondary | ICD-10-CM | POA: Diagnosis not present

## 2019-04-20 DIAGNOSIS — I10 Essential (primary) hypertension: Secondary | ICD-10-CM | POA: Diagnosis not present

## 2019-04-20 DIAGNOSIS — L97421 Non-pressure chronic ulcer of left heel and midfoot limited to breakdown of skin: Secondary | ICD-10-CM | POA: Diagnosis not present

## 2019-04-20 DIAGNOSIS — S61304D Unspecified open wound of right ring finger with damage to nail, subsequent encounter: Secondary | ICD-10-CM | POA: Diagnosis not present

## 2019-04-21 DIAGNOSIS — I10 Essential (primary) hypertension: Secondary | ICD-10-CM | POA: Diagnosis not present

## 2019-04-21 DIAGNOSIS — E11621 Type 2 diabetes mellitus with foot ulcer: Secondary | ICD-10-CM | POA: Diagnosis not present

## 2019-04-21 DIAGNOSIS — F419 Anxiety disorder, unspecified: Secondary | ICD-10-CM | POA: Diagnosis not present

## 2019-04-21 DIAGNOSIS — L97421 Non-pressure chronic ulcer of left heel and midfoot limited to breakdown of skin: Secondary | ICD-10-CM | POA: Diagnosis not present

## 2019-04-21 DIAGNOSIS — E1140 Type 2 diabetes mellitus with diabetic neuropathy, unspecified: Secondary | ICD-10-CM | POA: Diagnosis not present

## 2019-04-21 DIAGNOSIS — S61304D Unspecified open wound of right ring finger with damage to nail, subsequent encounter: Secondary | ICD-10-CM | POA: Diagnosis not present

## 2019-04-24 ENCOUNTER — Ambulatory Visit (INDEPENDENT_AMBULATORY_CARE_PROVIDER_SITE_OTHER): Payer: Medicare Other | Admitting: Internal Medicine

## 2019-04-24 DIAGNOSIS — D696 Thrombocytopenia, unspecified: Secondary | ICD-10-CM

## 2019-04-24 DIAGNOSIS — R197 Diarrhea, unspecified: Secondary | ICD-10-CM

## 2019-04-24 DIAGNOSIS — E7800 Pure hypercholesterolemia, unspecified: Secondary | ICD-10-CM

## 2019-04-24 DIAGNOSIS — I10 Essential (primary) hypertension: Secondary | ICD-10-CM | POA: Diagnosis not present

## 2019-04-24 DIAGNOSIS — L97529 Non-pressure chronic ulcer of other part of left foot with unspecified severity: Secondary | ICD-10-CM | POA: Diagnosis not present

## 2019-04-24 DIAGNOSIS — R634 Abnormal weight loss: Secondary | ICD-10-CM | POA: Diagnosis not present

## 2019-04-24 DIAGNOSIS — F419 Anxiety disorder, unspecified: Secondary | ICD-10-CM

## 2019-04-24 DIAGNOSIS — Z8673 Personal history of transient ischemic attack (TIA), and cerebral infarction without residual deficits: Secondary | ICD-10-CM

## 2019-04-24 DIAGNOSIS — E1140 Type 2 diabetes mellitus with diabetic neuropathy, unspecified: Secondary | ICD-10-CM

## 2019-04-24 NOTE — Progress Notes (Signed)
Patient ID: Angela Patterson, female   DOB: 21-Oct-1920, 85 y.o.   MRN: [id number]   Virtual Visit via telephone Note  This visit type was conducted due to national recommendations for restrictions regarding the COVID-19 pandemic (e.g. social distancing).  This format is felt to be most appropriate for this patient at this time.  All issues noted in this document were discussed and addressed.  No physical exam was performed (except for noted visual exam findings with Video Visits).   I connected with Angela Patterson by telephone and verified that I am speaking with the correct person using two identifiers. Location patient: home Location provider: work Persons participating in the telephone visit: patient, provider and pts daughter Angela Patterson  I discussed the limitations, risks, security and privacy concerns of performing an evaluation and management service by telephone and the availability of in person appointments.  The patient expressed understanding and agreed to proceed.   Reason for visit: scheduled follow up.   HPI: Sugars elevated.  States am sugars averaging 250-280 and pm sugars 217-280.  Eating better.  Weight 115 pounds now.  Taking metformin. Tolerating. Occasional loose stool, but family relates to increased stress.  Does report noticing some "labored breathing" at times.  On discussion, notices some sob at times.  Feels is related to anxiety.  Sometimes can occur while sitting.  Sometimes with exertion.  No chest pain.  Pt denies sob.  No acid reflux.  No abdominal pain.  Bowels moving.  Taking zoloft.  Discussed increasing zoloft to help with anxiety.  Discussed the need for better sugar control.     ROS: See pertinent positives and negatives per HPI.  Past Medical History:  Diagnosis Date  . Allergy   . Chicken pox   . CVA (cerebral vascular accident) ([place])   . Diabetes mellitus ([place])   . Hypercholesterolemia   . Hypertension     Past Surgical History:  Procedure  Laterality Date  . ABDOMINAL HYSTERECTOMY  1971   excessive bleeding  . APPENDECTOMY  age 44  . BREAST LUMPECTOMY  1958   benign  . DILATION AND CURETTAGE OF UTERUS  1970    Family History  Problem Relation Age of Onset  . Liver cancer Father   . Stroke Mother   . Heart attack Brother   . Hypertension Daughter   . Hypertension Son   . Hypertension Daughter   . Cancer Grandchild        breast  . Diabetes Grandchild   . Breast cancer Neg Hx   . Colon cancer Neg Hx     SOCIAL HX: reviewed.     Current Outpatient Medications:  .  amLODipine (NORVASC) 5 MG tablet, Take 1 tablet (5 mg total) by mouth 2 (two) times daily., Disp: 180 tablet, Rfl: 1 .  Cholecalciferol (VITAMIN D) 2000 units tablet, Take by mouth., Disp: , Rfl:  .  clopidogrel (PLAVIX) 75 MG tablet, Take 1 tablet (75 mg total) by mouth daily., Disp: 90 tablet, Rfl: 1 .  gentamicin cream (GARAMYCIN) 0.1 %, Apply 1 application topically 3 (three) times daily., Disp: 30 g, Rfl: 1 .  glucose blood test strip, USE TO CHECK BLOOD SUGAR  TWO TIMES DAILY, Disp: 200 each, Rfl: 1 .  hydrocortisone (PROCTOCORT) 1 % CREA, Apply thin layer around rectal opening 2 times per day to help hemorrhoids, Disp: 1 Tube, Rfl: 1 .  Lancet Devices (ONE TOUCH DELICA LANCING DEV) MISC, Use twice daily Dx: 250.00, Disp: 100 each,  Rfl: 5 .  metFORMIN (GLUCOPHAGE-XR) 500 MG 24 hr tablet, TAKE 1 TABLET BY MOUTH TWO  TIMES DAILY BEFORE A MEAL., Disp: 180 tablet, Rfl: 1 .  Multiple Vitamin (MULTIVITAMIN) tablet, Take 1 tablet by mouth daily., Disp: , Rfl:  .  mupirocin cream (BACTROBAN) 2 %, Apply topically., Disp: , Rfl:  .  Omega-3 1000 MG CAPS, Take by mouth., Disp: , Rfl:  .  Probiotic Product (PROBIOTIC DAILY PO), Take 1 tablet by mouth daily., Disp: , Rfl:  .  sertraline (ZOLOFT) 25 MG tablet, TAKE 1 TABLET BY MOUTH  DAILY, Disp: 90 tablet, Rfl: 3 .  timolol (BETIMOL) 0.5 % ophthalmic solution, 1 drop 2 (two) times daily., Disp: , Rfl:  .   timolol (TIMOPTIC) 0.5 % ophthalmic solution, , Disp: , Rfl:   EXAM:  GENERAL: alert. Sounds to be in no acute distress.  Answering questions appropriately.    PSYCH/NEURO: pleasant and cooperative, no obvious depression or anxiety, speech and thought processing grossly intact  ASSESSMENT AND PLAN:  Discussed the following assessment and plan:  Anxiety On zoloft.  Increased anxiety.  Increase zoloft to 25mg  q day.  Follow.    Diabetes mellitus Sugars elevated.  On metformin.  Discuss with pt and family - referral to [facility] for CCM.  Per family, does not feel the metformin is worsening bowel issues.  They feel related to anxiety.  Continue metformin.  Follow sugars.    Diarrhea Overall improved.  Family reports tends to occur with increased anxiety.  Increase zoloft to 25mg  q day.    Essential (primary) hypertension Blood pressure has been doing well.  Continue current medication regimen.  Follow pressures.  Follow metabolic panel.    Foot ulcer ([place]) Persistent.  Followed by podiatry.    History of CVA (cerebrovascular accident) On plavix and doing well.  Follow.    Hypercholesterolemia Follow lipid panel.   Thrombocytopenia ([place]) Follow cbc.   Weight loss Eating better.  Family monitoring.  Follow.      I discussed the assessment and treatment plan with the patient. The patient was provided an opportunity to ask questions and all were answered. The patient agreed with the plan and demonstrated an understanding of the instructions.   The patient was advised to call back or seek an in-person evaluation if the symptoms worsen or if the condition fails to improve as anticipated.  I provided 23 minutes of non-face-to-face time during this encounter.   Einar Pheasant, MD

## 2019-04-25 ENCOUNTER — Ambulatory Visit (INDEPENDENT_AMBULATORY_CARE_PROVIDER_SITE_OTHER): Payer: Medicare Other | Admitting: Podiatry

## 2019-04-25 ENCOUNTER — Encounter: Payer: Self-pay | Admitting: Podiatry

## 2019-04-25 ENCOUNTER — Other Ambulatory Visit: Payer: Self-pay

## 2019-04-25 DIAGNOSIS — B351 Tinea unguium: Secondary | ICD-10-CM | POA: Diagnosis not present

## 2019-04-25 DIAGNOSIS — L97522 Non-pressure chronic ulcer of other part of left foot with fat layer exposed: Secondary | ICD-10-CM

## 2019-04-25 DIAGNOSIS — E0843 Diabetes mellitus due to underlying condition with diabetic autonomic (poly)neuropathy: Secondary | ICD-10-CM

## 2019-04-25 DIAGNOSIS — M79676 Pain in unspecified toe(s): Secondary | ICD-10-CM

## 2019-04-26 DIAGNOSIS — E1140 Type 2 diabetes mellitus with diabetic neuropathy, unspecified: Secondary | ICD-10-CM | POA: Diagnosis not present

## 2019-04-26 DIAGNOSIS — S61304D Unspecified open wound of right ring finger with damage to nail, subsequent encounter: Secondary | ICD-10-CM | POA: Diagnosis not present

## 2019-04-26 DIAGNOSIS — F419 Anxiety disorder, unspecified: Secondary | ICD-10-CM | POA: Diagnosis not present

## 2019-04-26 DIAGNOSIS — I10 Essential (primary) hypertension: Secondary | ICD-10-CM | POA: Diagnosis not present

## 2019-04-26 DIAGNOSIS — L97421 Non-pressure chronic ulcer of left heel and midfoot limited to breakdown of skin: Secondary | ICD-10-CM | POA: Diagnosis not present

## 2019-04-26 DIAGNOSIS — E11621 Type 2 diabetes mellitus with foot ulcer: Secondary | ICD-10-CM | POA: Diagnosis not present

## 2019-04-28 NOTE — Progress Notes (Signed)
   Subjective:  85 year old female with PMHx of DM presenting today for follow up evaluation of an ulceration of the left foot. She states the wound has not changed much since her last visit. She has been using Aquacel Ag as directed. She denies any modifying factors.  She also complains of elongated, thickened nails 1-5 bilaterally that cause pain while ambulating in shoes. She is unable to trim her own nails. Patient is here for further evaluation and treatment.   Past Medical History:  Diagnosis Date  . Allergy   . Chicken pox   . CVA (cerebral vascular accident) ([place])   . Diabetes mellitus ([place])   . Hypercholesterolemia   . Hypertension       Objective/Physical Exam General: The patient is alert and oriented x3 in no acute distress.  Dermatology:  Wound #1 noted to the sub-first MPJ of the left foot measuring 2.5 x 3.0 x 0.3 cm (LxWxD).     To the noted ulceration(s), there is no eschar. There is a moderate amount of slough, fibrin, and necrotic tissue noted. Granulation tissue and wound base is red. There is a minimal amount of serosanguineous drainage noted. There is no exposed bone muscle-tendon ligament or joint. There is no malodor. Periwound integrity is intact. Skin is warm, dry and supple bilateral lower extremities. Nails are tender, long, thickened and dystrophic with subungual debris, consistent with onychomycosis, 1-5 bilateral.   Vascular: Palpable pedal pulses bilaterally. No edema or erythema noted. Capillary refill within normal limits.  Neurological: Epicritic and protective threshold diminished bilaterally.   Musculoskeletal Exam: Range of motion within normal limits to all pedal and ankle joints bilateral. Muscle strength 5/5 in all groups bilateral.   Assessment: #1 ulceration of the sub-first MPJ of the left foot secondary to diabetes mellitus #2 diabetes mellitus w/ peripheral neuropathy #3 Onychodystrophic nails 1-5 bilateral with hyperkeratosis of nails.   #4 Onychomycosis of nail due to dermatophyte bilateral    Plan of Care:  #1 Patient was evaluated. #2 Medically necessary excisional debridement including subcutaneous tissue was performed using a tissue nipper and a chisel blade. Excisional debridement of all the necrotic nonviable tissue down to healthy bleeding viable tissue was performed with post-debridement measurements same as pre-. #3 The wound was cleansed and dry sterile dressing applied. #4 Continue using Aquacel Ag and a dry sterile dressing to left foot ulcer every other day.  #5 Continue wearing DM shoes and insoles.  #6 Mechanical debridement of nails 1-5 bilaterally performed using a nail nipper. Filed with dremel without incident.  #7 Return to clinic in 3 weeks.     [name], DPM [facility]  Dr. [name], [place]                                        [place], [place] [address]                Office [phone]  Fax [phone]

## 2019-04-29 ENCOUNTER — Encounter: Payer: Self-pay | Admitting: Internal Medicine

## 2019-04-29 NOTE — Assessment & Plan Note (Signed)
On plavix and doing well.  Follow.   

## 2019-04-29 NOTE — Assessment & Plan Note (Signed)
Sugars elevated.  On metformin.  Discuss with pt and family - referral to [facility] for CCM.  Per family, does not feel the metformin is worsening bowel issues.  They feel related to anxiety.  Continue metformin.  Follow sugars.

## 2019-04-29 NOTE — Assessment & Plan Note (Signed)
Overall improved.  Family reports tends to occur with increased anxiety.  Increase zoloft to 25mg  q day.

## 2019-04-29 NOTE — Assessment & Plan Note (Signed)
Blood pressure has been doing well.  Continue current medication regimen.  Follow pressures.  Follow metabolic panel.  

## 2019-04-29 NOTE — Assessment & Plan Note (Signed)
Follow cbc.  

## 2019-04-29 NOTE — Assessment & Plan Note (Signed)
On zoloft.  Increased anxiety.  Increase zoloft to 25mg  q day.  Follow.

## 2019-04-29 NOTE — Assessment & Plan Note (Signed)
Follow lipid panel.   

## 2019-04-29 NOTE — Assessment & Plan Note (Signed)
Eating better.  Family monitoring.  Follow.

## 2019-04-29 NOTE — Assessment & Plan Note (Signed)
Persistent.  Followed by podiatry.

## 2019-05-03 DIAGNOSIS — E1140 Type 2 diabetes mellitus with diabetic neuropathy, unspecified: Secondary | ICD-10-CM | POA: Diagnosis not present

## 2019-05-03 DIAGNOSIS — E11621 Type 2 diabetes mellitus with foot ulcer: Secondary | ICD-10-CM | POA: Diagnosis not present

## 2019-05-03 DIAGNOSIS — I10 Essential (primary) hypertension: Secondary | ICD-10-CM | POA: Diagnosis not present

## 2019-05-03 DIAGNOSIS — F419 Anxiety disorder, unspecified: Secondary | ICD-10-CM | POA: Diagnosis not present

## 2019-05-03 DIAGNOSIS — S61304D Unspecified open wound of right ring finger with damage to nail, subsequent encounter: Secondary | ICD-10-CM | POA: Diagnosis not present

## 2019-05-03 DIAGNOSIS — L97421 Non-pressure chronic ulcer of left heel and midfoot limited to breakdown of skin: Secondary | ICD-10-CM | POA: Diagnosis not present

## 2019-05-08 ENCOUNTER — Ambulatory Visit: Payer: Self-pay | Admitting: Pharmacist

## 2019-05-08 NOTE — Chronic Care Management (AMB) (Signed)
  Chronic Care Management   Note  05/08/2019 Name: Angela Patterson MRN: [id number] DOB: 1921/01/09  Angela Patterson is a 85 y.o. year old female who is a primary care patient of Einar Pheasant, MD. The CCM team was consulted for assistance with chronic disease management and care coordination needs.    Contacted patient's daughter, Angela Patterson. Scheduled phone call visit for 05/22/2019.  Catie Darnelle Maffucci, PharmD, [place] Pharmacist [facility] [phone]

## 2019-05-08 NOTE — Progress Notes (Signed)
Reviewed.  Agree with plan for f/u with Catie.    Dr Nicki Reaper

## 2019-05-11 DIAGNOSIS — F419 Anxiety disorder, unspecified: Secondary | ICD-10-CM | POA: Diagnosis not present

## 2019-05-11 DIAGNOSIS — I10 Essential (primary) hypertension: Secondary | ICD-10-CM | POA: Diagnosis not present

## 2019-05-11 DIAGNOSIS — L97421 Non-pressure chronic ulcer of left heel and midfoot limited to breakdown of skin: Secondary | ICD-10-CM | POA: Diagnosis not present

## 2019-05-11 DIAGNOSIS — E11621 Type 2 diabetes mellitus with foot ulcer: Secondary | ICD-10-CM | POA: Diagnosis not present

## 2019-05-11 DIAGNOSIS — S61304D Unspecified open wound of right ring finger with damage to nail, subsequent encounter: Secondary | ICD-10-CM | POA: Diagnosis not present

## 2019-05-11 DIAGNOSIS — E1140 Type 2 diabetes mellitus with diabetic neuropathy, unspecified: Secondary | ICD-10-CM | POA: Diagnosis not present

## 2019-05-12 DIAGNOSIS — S61304D Unspecified open wound of right ring finger with damage to nail, subsequent encounter: Secondary | ICD-10-CM | POA: Diagnosis not present

## 2019-05-12 DIAGNOSIS — M6281 Muscle weakness (generalized): Secondary | ICD-10-CM | POA: Diagnosis not present

## 2019-05-12 DIAGNOSIS — L97421 Non-pressure chronic ulcer of left heel and midfoot limited to breakdown of skin: Secondary | ICD-10-CM | POA: Diagnosis not present

## 2019-05-12 DIAGNOSIS — E11621 Type 2 diabetes mellitus with foot ulcer: Secondary | ICD-10-CM | POA: Diagnosis not present

## 2019-05-12 DIAGNOSIS — I10 Essential (primary) hypertension: Secondary | ICD-10-CM | POA: Diagnosis not present

## 2019-05-12 DIAGNOSIS — Z7984 Long term (current) use of oral hypoglycemic drugs: Secondary | ICD-10-CM | POA: Diagnosis not present

## 2019-05-12 DIAGNOSIS — Z8673 Personal history of transient ischemic attack (TIA), and cerebral infarction without residual deficits: Secondary | ICD-10-CM

## 2019-05-12 DIAGNOSIS — F419 Anxiety disorder, unspecified: Secondary | ICD-10-CM | POA: Diagnosis not present

## 2019-05-12 DIAGNOSIS — R2689 Other abnormalities of gait and mobility: Secondary | ICD-10-CM

## 2019-05-12 DIAGNOSIS — E1140 Type 2 diabetes mellitus with diabetic neuropathy, unspecified: Secondary | ICD-10-CM | POA: Diagnosis not present

## 2019-05-16 ENCOUNTER — Encounter: Payer: Self-pay | Admitting: Podiatry

## 2019-05-16 ENCOUNTER — Other Ambulatory Visit: Payer: Self-pay

## 2019-05-16 ENCOUNTER — Ambulatory Visit (INDEPENDENT_AMBULATORY_CARE_PROVIDER_SITE_OTHER): Payer: Medicare Other | Admitting: Podiatry

## 2019-05-16 DIAGNOSIS — R2689 Other abnormalities of gait and mobility: Secondary | ICD-10-CM | POA: Diagnosis not present

## 2019-05-16 DIAGNOSIS — F419 Anxiety disorder, unspecified: Secondary | ICD-10-CM | POA: Diagnosis not present

## 2019-05-16 DIAGNOSIS — Z7984 Long term (current) use of oral hypoglycemic drugs: Secondary | ICD-10-CM | POA: Diagnosis not present

## 2019-05-16 DIAGNOSIS — L97522 Non-pressure chronic ulcer of other part of left foot with fat layer exposed: Secondary | ICD-10-CM | POA: Diagnosis not present

## 2019-05-16 DIAGNOSIS — E11621 Type 2 diabetes mellitus with foot ulcer: Secondary | ICD-10-CM | POA: Diagnosis not present

## 2019-05-16 DIAGNOSIS — M6281 Muscle weakness (generalized): Secondary | ICD-10-CM | POA: Diagnosis not present

## 2019-05-16 DIAGNOSIS — Z8673 Personal history of transient ischemic attack (TIA), and cerebral infarction without residual deficits: Secondary | ICD-10-CM | POA: Diagnosis not present

## 2019-05-16 DIAGNOSIS — I10 Essential (primary) hypertension: Secondary | ICD-10-CM | POA: Diagnosis not present

## 2019-05-16 DIAGNOSIS — L97421 Non-pressure chronic ulcer of left heel and midfoot limited to breakdown of skin: Secondary | ICD-10-CM | POA: Diagnosis not present

## 2019-05-16 DIAGNOSIS — E0843 Diabetes mellitus due to underlying condition with diabetic autonomic (poly)neuropathy: Secondary | ICD-10-CM

## 2019-05-16 DIAGNOSIS — E1140 Type 2 diabetes mellitus with diabetic neuropathy, unspecified: Secondary | ICD-10-CM | POA: Diagnosis not present

## 2019-05-17 DIAGNOSIS — Z7984 Long term (current) use of oral hypoglycemic drugs: Secondary | ICD-10-CM | POA: Diagnosis not present

## 2019-05-17 DIAGNOSIS — I10 Essential (primary) hypertension: Secondary | ICD-10-CM | POA: Diagnosis not present

## 2019-05-17 DIAGNOSIS — E11621 Type 2 diabetes mellitus with foot ulcer: Secondary | ICD-10-CM | POA: Diagnosis not present

## 2019-05-17 DIAGNOSIS — F419 Anxiety disorder, unspecified: Secondary | ICD-10-CM | POA: Diagnosis not present

## 2019-05-17 DIAGNOSIS — E1140 Type 2 diabetes mellitus with diabetic neuropathy, unspecified: Secondary | ICD-10-CM | POA: Diagnosis not present

## 2019-05-17 DIAGNOSIS — L97421 Non-pressure chronic ulcer of left heel and midfoot limited to breakdown of skin: Secondary | ICD-10-CM | POA: Diagnosis not present

## 2019-05-18 NOTE — Progress Notes (Signed)
   Subjective:  85 year old female with PMHx of DM presenting today for follow up evaluation of an ulceration of the left foot. She states the wound is about the same. She has been using the Aquacel as directed. She denies any modifying factors. Patient is here for further evaluation and treatment.   Past Medical History:  Diagnosis Date  . Allergy   . Chicken pox   . CVA (cerebral vascular accident) ([place])   . Diabetes mellitus ([place])   . Hypercholesterolemia   . Hypertension       Objective/Physical Exam General: The patient is alert and oriented x3 in no acute distress.  Dermatology:  Wound #1 noted to the sub-first MPJ of the left foot measuring 2.5 x 3.0 x 0.3 cm (LxWxD).     To the noted ulceration(s), there is no eschar. There is a moderate amount of slough, fibrin, and necrotic tissue noted. Granulation tissue and wound base is red. There is a minimal amount of serosanguineous drainage noted. There is no exposed bone muscle-tendon ligament or joint. There is no malodor. Periwound integrity is intact. Skin is warm, dry and supple bilateral lower extremities.   Vascular: Palpable pedal pulses bilaterally. No edema or erythema noted. Capillary refill within normal limits.  Neurological: Epicritic and protective threshold diminished bilaterally.   Musculoskeletal Exam: Range of motion within normal limits to all pedal and ankle joints bilateral. Muscle strength 5/5 in all groups bilateral.   Assessment: #1 ulceration of the sub-first MPJ of the left foot secondary to diabetes mellitus #2 diabetes mellitus w/ peripheral neuropathy  Plan of Care:  #1 Patient was evaluated. #2 Medically necessary excisional debridement including subcutaneous tissue was performed using a tissue nipper and a chisel blade. Excisional debridement of all the necrotic nonviable tissue down to healthy bleeding viable tissue was performed with post-debridement measurements same as pre-. #3 The wound was  cleansed and dry sterile dressing applied. #4 Continue using Aquacel Ag and a dry sterile dressing daily.  #5 Met pads placed onto DM insoles.  #6 Return to clinic in 3 weeks.     [name], DPM [facility]  Dr. [name], [place]                                        [place], [place] [address]                Office [phone]  Fax [phone]

## 2019-05-19 ENCOUNTER — Other Ambulatory Visit: Payer: Self-pay | Admitting: Internal Medicine

## 2019-05-22 ENCOUNTER — Ambulatory Visit (INDEPENDENT_AMBULATORY_CARE_PROVIDER_SITE_OTHER): Payer: Medicare Other | Admitting: Pharmacist

## 2019-05-22 DIAGNOSIS — E1140 Type 2 diabetes mellitus with diabetic neuropathy, unspecified: Secondary | ICD-10-CM

## 2019-05-22 MED ORDER — METFORMIN HCL ER 500 MG PO TB24: 1000.0000 mg | ORAL_TABLET | Freq: Two times a day (BID) | ORAL | 3 refills | Status: DC

## 2019-05-22 NOTE — Progress Notes (Signed)
Reviewed information.  Agree with assessment and plan.  Monitor for any bowel change.   Dr Nicki Reaper

## 2019-05-22 NOTE — Chronic Care Management (AMB) (Signed)
Chronic Care Management   Note  05/22/2019 Name: Angela Patterson MRN: [id number] DOB: [birth year]   Subjective:  Angela Patterson is a 85 y.o. year old female who is a primary care patient of Einar Pheasant, MD. The CCM team was consulted for assistance with chronic disease management and care coordination needs.    Contacted patient and daughter, [name], for medication management support.   Ms. Clippinger was given information about Chronic Care Management services today including:  1. CCM service includes personalized support from designated clinical staff supervised by her physician, including individualized plan of care and coordination with other care providers 2. 24/7 contact phone numbers for assistance for urgent and routine care needs. 3. Service will only be billed when office clinical staff spend 20 minutes or more in a month to coordinate care. 4. Only one practitioner may furnish and bill the service in a calendar month. 5. The patient may stop CCM services at any time (effective at the end of the month) by phone call to the office staff. 6. The patient will be responsible for cost sharing (co-pay) of up to 20% of the service fee (after annual deductible is met).  Patient agreed to services and verbal consent obtained.   Review of patient status, including review of consultants reports, laboratory and other test data, was performed as part of comprehensive evaluation and provision of chronic care management services.   Objective:  Lab Results  Component Value Date   CREATININE 0.63 04/17/2019   CREATININE 0.64 11/03/2018   CREATININE 0.64 06/22/2018    Lab Results  Component Value Date   HGBA1C 8.4 (H) 04/17/2019       Component Value Date/Time   CHOL 164 04/17/2019 0858   TRIG 169.0 (H) 04/17/2019 0858   HDL 42.90 04/17/2019 0858   CHOLHDL 4 04/17/2019 0858   VLDL 33.8 04/17/2019 0858   LDLCALC 87 04/17/2019 0858   LDLDIRECT 119.0 11/03/2018 0807    Clinical  ASCVD: Yes - hx CVA    BP Readings from Last 3 Encounters:  02/20/19 138/70  02/16/19 137/68  08/23/18 128/62    Allergies  Allergen Reactions  . Actos [Pioglitazone] Swelling  . Contrast Media [Iodinated Diagnostic Agents] Swelling  . Prandin [Repaglinide] Swelling  . Pravastatin Sodium     cramps    Medications Reviewed Today    Reviewed by De Hollingshead, [facility] (Pharmacist) on 05/22/19 at 1144  Med List Status: <None>  Medication Order Taking? Sig Documenting Provider Last Dose Status Informant  amLODipine (NORVASC) 5 MG tablet [id number] Yes Take 1 tablet (5 mg total) by mouth 2 (two) times daily. Einar Pheasant, MD Taking Active            Med Note Darnelle Maffucci, Waynette Buttery May 22, 2019 11:40 AM)    Cholecalciferol (VITAMIN D) 2000 units tablet [id number] Yes Take by mouth. [provider] Taking Active   clopidogrel (PLAVIX) 75 MG tablet [id number] Yes Take 1 tablet (75 mg total) by mouth daily. Einar Pheasant, MD Taking Active         Discontinued 05/22/19 1144 (Completed Course)   glucose blood test strip [id number] Yes USE TO CHECK BLOOD SUGAR  TWO TIMES DAILY Einar Pheasant, MD Taking Active   hydrocortisone (PROCTOCORT) 1 % CREA [id number] Yes Apply thin layer around rectal opening 2 times per day to help hemorrhoids Guse, Jacquelynn Cree, FNP Taking Active   Lancet Devices ([facility] DELICA LANCING DEV) MISC [id number] Yes Use twice daily  Dx: 250.00 Einar Pheasant, MD Taking Active   metFORMIN (GLUCOPHAGE-XR) 500 MG 24 hr tablet [id number] Yes TAKE 1 TABLET BY MOUTH TWO  TIMES DAILY BEFORE A MEAL. Einar Pheasant, MD Taking Active            Med Note Darnelle Maffucci, Waynette Buttery May 22, 2019 11:40 AM) 2 QAM, 1 QPM  Multiple Vitamin (MULTIVITAMIN) tablet [id number] Yes Take 1 tablet by mouth daily. [provider] Taking Active         Discontinued 05/22/19 1144 (Completed Course)   Omega-3 1000 MG CAPS [id number] Yes Take by mouth. [provider] Taking  Active   Probiotic Product (PROBIOTIC DAILY PO) [id number] Yes Take 1 tablet by mouth daily. [provider] Taking Active Self  sertraline (ZOLOFT) 25 MG tablet [id number] Yes TAKE 1 TABLET BY MOUTH  DAILY Einar Pheasant, MD Taking Active   timolol (BETIMOL) 0.5 % ophthalmic solution [id number] Yes 1 drop 2 (two) times daily. [provider] Taking Active         Discontinued 70/62/37 6283 (Duplicate)            Med Note Leonides Schanz, JENNIFER L   Tue Oct 01, 2015  3:04 PM) Received from: External Pharmacy           Assessment:   Goals Addressed            This Visit's Progress     Patient Stated   . "We want her to stay healthy" (pt-stated)       Current Barriers:  . Diabetes: uncontrolled, complicated by diabetic ulcer; most recent A1c 8.4%  . Current antihyperglycemic regimen: metformin XR 1000 mg QAM, 500 mg QPM o Increased to 3 tablets daily ~1.5 month ago, notes improvement in sugars- readings previously in high 200s to 300s . Denies hypoglycemia, but does get shaky around 10 am . Weight 115 lbs today, had previously lost weight  . Current meal patterns: o Breakfast: Cereal + banana; sausage, egg, and cheese biscuit; coffee + cream (no sugar) o 10 am snack: Boost or Glucerna shake o Lunch 11:30-12: [place] + cheese sandwich; sub sandwich; MOW lunch; water  o Supper 4:30 pm: smaller suppers would be sandwich or potato salad; sometimes MOW meals; water  o Snacks: no, not much of a snacker; does keep apples around for snacks  o Drinks: Coffee and water . Current exercise:  o Very active: Wakes up around 6:30- 7 am (lives alone); daughter comes up around 10 am; waters plants; run errands together, still very active . Current blood glucose readings:   Fasting PM  2-Nov 187   31-Oct 178   30-Oct 251 245  29-Oct 273 247  28-Oct 166 259  26-Oct 176 260  Average 205 252   . Cardiovascular risk reduction: o Current hypertensive regimen: amlodipine 5 mg BID o  Current hyperlipidemia regimen: omega 3 fatty acids  Pharmacist Clinical Goal(s):  Marland Kitchen Over the next 90 days, patient will work with PharmD and primary care provider to address optimized glycemic regimen  Interventions: . Comprehensive medication review performed, medication list updated in electronic medical record . Extensive discussion of dietary and lifestyle habits. Goal is to avoid weight loss and avoid hypoglycemia, though improved glucose control desired d/t diabetic ulcer (daughter notes she has been struggling with this for over 6 years) . Increase metformin to 1000 mg BID. Updated prescription sent to pharmacy.  . Moving forward, would consider adding DPP4 vs SGLT2 as needed for improved  glucose control. Avoid GLP1 d/t impact on weight, avoid sulfonylurea and insulin d/t risk of hypoglycemia  Patient Self Care Activities:  . Patient will check blood glucose BID , document, and provide at future appointments . Patient will take medications as prescribed . Patient will report any questions or concerns to provider   Initial goal documentation          Plan: - Scheduled follow up appointment in 4-5 weeks for continued medication management support  Catie Darnelle Maffucci, PharmD, [place] Pharmacist [facility] [place] [phone]

## 2019-05-22 NOTE — Patient Instructions (Signed)
Visit Information  Goals Addressed            This Visit's Progress     Patient Stated   . "We want her to stay healthy" (pt-stated)       Current Barriers:  . Diabetes: uncontrolled, complicated by diabetic ulcer; most recent A1c 8.4%  . Current antihyperglycemic regimen: metformin XR 1000 mg QAM, 500 mg QPM o Increased to 3 tablets daily ~1.5 month ago, notes improvement in sugars- readings previously in high 200s to 300s . Denies hypoglycemia, but does get shaky around 10 am . Weight 115 lbs today, had previously lost weight  . Current meal patterns: o Breakfast: Cereal + banana; sausage, egg, and cheese biscuit; coffee + cream (no sugar) o 10 am snack: Boost or Glucerna shake o Lunch 11:30-12: [place] + cheese sandwich; sub sandwich; MOW lunch; water  o Supper 4:30 pm: smaller suppers would be sandwich or potato salad; sometimes MOW meals; water  o Snacks: no, not much of a snacker; does keep apples around for snacks  o Drinks: Coffee and water . Current exercise:  o Very active: Wakes up around 6:30- 7 am (lives alone); daughter comes up around 10 am; waters plants; run errands together, still very active . Current blood glucose readings:   Fasting PM  2-Nov 187   31-Oct 178   30-Oct 251 245  29-Oct 273 247  28-Oct 166 259  26-Oct 176 260  Average 205 252   . Cardiovascular risk reduction: o Current hypertensive regimen: amlodipine 5 mg BID o Current hyperlipidemia regimen: omega 3 fatty acids  Pharmacist Clinical Goal(s):  Marland Kitchen Over the next 90 days, patient will work with PharmD and primary care provider to address optimized glycemic regimen  Interventions: . Comprehensive medication review performed, medication list updated in electronic medical record . Extensive discussion of dietary and lifestyle habits. Goal is to avoid weight loss and avoid hypoglycemia, though improved glucose control desired d/t diabetic ulcer (daughter notes she has been struggling with this  for over 6 years) . Increase metformin to 1000 mg BID. Updated prescription sent to pharmacy.  . Moving forward, would consider adding DPP4 vs SGLT2 as needed for improved glucose control. Avoid GLP1 d/t impact on weight, avoid sulfonylurea and insulin d/t risk of hypoglycemia  Patient Self Care Activities:  . Patient will check blood glucose BID , document, and provide at future appointments . Patient will take medications as prescribed . Patient will report any questions or concerns to provider   Initial goal documentation          Angela Patterson was given information about Chronic Care Management services today including:  1. CCM service includes personalized support from designated clinical staff supervised by her physician, including individualized plan of care and coordination with other care providers 2. 24/7 contact phone numbers for assistance for urgent and routine care needs. 3. Service will only be billed when office clinical staff spend 20 minutes or more in a month to coordinate care. 4. Only one practitioner may furnish and bill the service in a calendar month. 5. The patient may stop CCM services at any time (effective at the end of the month) by phone call to the office staff. 6. The patient will be responsible for cost sharing (co-pay) of up to 20% of the service fee (after annual deductible is met).  Patient agreed to services and verbal consent obtained.   The patient verbalized understanding of instructions provided today and declined a print copy  of patient instruction materials.   Plan: - Scheduled follow up appointment in 4-5 weeks for continued medication management support  Catie Darnelle Maffucci, PharmD, [place] Pharmacist [facility] [phone]

## 2019-06-06 ENCOUNTER — Ambulatory Visit (INDEPENDENT_AMBULATORY_CARE_PROVIDER_SITE_OTHER): Payer: Medicare Other | Admitting: Podiatry

## 2019-06-06 ENCOUNTER — Encounter: Payer: Self-pay | Admitting: Podiatry

## 2019-06-06 ENCOUNTER — Other Ambulatory Visit: Payer: Self-pay

## 2019-06-06 DIAGNOSIS — L97522 Non-pressure chronic ulcer of other part of left foot with fat layer exposed: Secondary | ICD-10-CM | POA: Diagnosis not present

## 2019-06-06 DIAGNOSIS — E0843 Diabetes mellitus due to underlying condition with diabetic autonomic (poly)neuropathy: Secondary | ICD-10-CM

## 2019-06-08 NOTE — Progress Notes (Signed)
   Subjective:  85 year old female with PMHx of DM presenting today for follow up evaluation of an ulceration of the left foot. She states the wound appears the same. She denies any significant pain or modifying factors. She has been using the Aquacel as directed. Patient is here for further evaluation and treatment.   Past Medical History:  Diagnosis Date  . Allergy   . Chicken pox   . CVA (cerebral vascular accident) ([place])   . Diabetes mellitus ([place])   . Hypercholesterolemia   . Hypertension       Objective/Physical Exam General: The patient is alert and oriented x3 in no acute distress.  Dermatology:  Wound #1 noted to the sub-first MPJ of the left foot measuring 1.0 x 1.0 x 0.2 cm (LxWxD).     To the noted ulceration(s), there is no eschar. There is a moderate amount of slough, fibrin, and necrotic tissue noted. Granulation tissue and wound base is red. There is a minimal amount of serosanguineous drainage noted. There is no exposed bone muscle-tendon ligament or joint. There is no malodor. Periwound integrity is intact. Skin is warm, dry and supple bilateral lower extremities.   Vascular: Palpable pedal pulses bilaterally. No edema or erythema noted. Capillary refill within normal limits.  Neurological: Epicritic and protective threshold diminished bilaterally.   Musculoskeletal Exam: Range of motion within normal limits to all pedal and ankle joints bilateral. Muscle strength 5/5 in all groups bilateral.   Assessment: #1 ulceration of the sub-first MPJ of the left foot secondary to diabetes mellitus #2 diabetes mellitus w/ peripheral neuropathy  Plan of Care:  #1 Patient was evaluated. #2 Medically necessary excisional debridement including subcutaneous tissue was performed using a tissue nipper and a chisel blade. Excisional debridement of all the necrotic nonviable tissue down to healthy bleeding viable tissue was performed with post-debridement measurements same as pre-.  #3 The wound was cleansed and dry sterile dressing applied. #4 Continue using Aquacel Ag and a dry sterile dressing daily.  #5 Met pads placed onto DM insoles.  #6 Return to clinic in 3 weeks.     [name], DPM [facility]  Dr. [name], [place]                                        [place], [place] [address]                Office [phone]  Fax [phone]

## 2019-06-15 DIAGNOSIS — I10 Essential (primary) hypertension: Secondary | ICD-10-CM | POA: Diagnosis not present

## 2019-06-15 DIAGNOSIS — Z7984 Long term (current) use of oral hypoglycemic drugs: Secondary | ICD-10-CM | POA: Diagnosis not present

## 2019-06-15 DIAGNOSIS — E11621 Type 2 diabetes mellitus with foot ulcer: Secondary | ICD-10-CM | POA: Diagnosis not present

## 2019-06-15 DIAGNOSIS — F419 Anxiety disorder, unspecified: Secondary | ICD-10-CM | POA: Diagnosis not present

## 2019-06-15 DIAGNOSIS — M6281 Muscle weakness (generalized): Secondary | ICD-10-CM | POA: Diagnosis not present

## 2019-06-15 DIAGNOSIS — Z8673 Personal history of transient ischemic attack (TIA), and cerebral infarction without residual deficits: Secondary | ICD-10-CM | POA: Diagnosis not present

## 2019-06-15 DIAGNOSIS — R2689 Other abnormalities of gait and mobility: Secondary | ICD-10-CM | POA: Diagnosis not present

## 2019-06-15 DIAGNOSIS — E1140 Type 2 diabetes mellitus with diabetic neuropathy, unspecified: Secondary | ICD-10-CM | POA: Diagnosis not present

## 2019-06-15 DIAGNOSIS — L97421 Non-pressure chronic ulcer of left heel and midfoot limited to breakdown of skin: Secondary | ICD-10-CM | POA: Diagnosis not present

## 2019-06-21 DIAGNOSIS — E11621 Type 2 diabetes mellitus with foot ulcer: Secondary | ICD-10-CM | POA: Diagnosis not present

## 2019-06-21 DIAGNOSIS — I10 Essential (primary) hypertension: Secondary | ICD-10-CM | POA: Diagnosis not present

## 2019-06-21 DIAGNOSIS — F419 Anxiety disorder, unspecified: Secondary | ICD-10-CM | POA: Diagnosis not present

## 2019-06-21 DIAGNOSIS — L97421 Non-pressure chronic ulcer of left heel and midfoot limited to breakdown of skin: Secondary | ICD-10-CM | POA: Diagnosis not present

## 2019-06-21 DIAGNOSIS — E1140 Type 2 diabetes mellitus with diabetic neuropathy, unspecified: Secondary | ICD-10-CM | POA: Diagnosis not present

## 2019-06-21 DIAGNOSIS — Z7984 Long term (current) use of oral hypoglycemic drugs: Secondary | ICD-10-CM | POA: Diagnosis not present

## 2019-06-26 ENCOUNTER — Ambulatory Visit (INDEPENDENT_AMBULATORY_CARE_PROVIDER_SITE_OTHER): Payer: Medicare Other | Admitting: Pharmacist

## 2019-06-26 DIAGNOSIS — E1140 Type 2 diabetes mellitus with diabetic neuropathy, unspecified: Secondary | ICD-10-CM | POA: Diagnosis not present

## 2019-06-26 NOTE — Patient Instructions (Signed)
Visit Information  Goals Addressed            This Visit's Progress     Patient Stated   . "We want her to stay healthy" (pt-stated)       Current Barriers:  . Diabetes: uncontrolled, complicated by diabetic ulcer; most recent A1c 8.4%  . Current antihyperglycemic regimen: metformin XR 1000 mg QAM, 500 mg QPM o Increased to 4 tablets daily at last visit; [name] notes that patient developed diarrhea about 5 days after, so resumed 3 tabs daily after that. No recurrence of diarrhea. . Denies hypoglycemia . Does note that patient complains about polyuria, and occasionally not being able to make it to the bathroom in time . Current exercise:  o Very active: Wakes up around 6:30- 7 am (lives alone); daughter comes up around 10 am; waters plants; run errands together . Current blood glucose readings:   Fasting Night  22-Nov 186 267  23-Nov 194 222  24-Nov 154   25-Nov 159 247  26-Nov 146 275  27-Nov 216 269  28-Nov 173 226  29-Nov 163 241  30-Nov 156 267  2-Nov 224 152  3-Dec 158 220  4-Dec 180 274  5-Dec 187   6-Dec 162 281  7-Dec 168    206 245   . Cardiovascular risk reduction: o Current hypertensive regimen: amlodipine 5 mg BID o Current hyperlipidemia regimen: omega 3 fatty acids; TG 169 on last check, but could have been associated w/ elevated A1c  Pharmacist Clinical Goal(s):  Marland Kitchen Over the next 90 days, patient will work with PharmD and primary care provider to address optimized glycemic regimen  Interventions: . Comprehensive medication review performed, medication list updated in electronic medical record . Discussed relaxed goal A1c of <8%, given chronic foot ulcer and complaints of polyuria. Discussed pros and cons of DPP4 vs SGLT2; given patient's complaints of polyuria at baseline, would likely lean more towards DPP4. Decided to defer any medication changes until upcoming A1c and PCP appointment.  . Will update metformin prescription to reflect current usage.    Patient Self Care Activities:  . Patient will check blood glucose BID , document, and provide at future appointments . Patient will take medications as prescribed . Patient will report any questions or concerns to provider   Please see past updates related to this goal by clicking on the "Past Updates" button in the selected goal           The patient verbalized understanding of instructions provided today and declined a print copy of patient instruction materials.    Plan: - Patient has PCP f/u in ~3 weeks; scheduled f/u ~4 weeks after that for medication management f/u  Catie Darnelle Maffucci, PharmD, [place], [place] Pharmacist [place] [phone]

## 2019-06-26 NOTE — Chronic Care Management (AMB) (Signed)
Chronic Care Management   Follow Up Note   06/26/2019 Name: Angela Patterson MRN: [id number] DOB: Aug 14, 1920  Referred by: Einar Pheasant, MD Reason for referral : Chronic Care Management (Medication Management)   SARYA ROATH is a 85 y.o. year old female who is a primary care patient of Einar Pheasant, MD. The CCM team was consulted for assistance with chronic disease management and care coordination needs.    Contacted patient's daughter, [name], for medication management follow up.   Review of patient status, including review of consultants reports, relevant laboratory and other test results, and collaboration with appropriate care team members and the patient's provider was performed as part of comprehensive patient evaluation and provision of chronic care management services.    SDOH (Social Determinants of Health) screening performed today: None. See Care Plan for related entries.   Outpatient Encounter Medications as of 06/26/2019  Medication Sig Note  . clopidogrel (PLAVIX) 75 MG tablet TAKE 1 TABLET BY MOUTH  DAILY   . hydrocortisone (PROCTOCORT) 1 % CREA Apply thin layer around rectal opening 2 times per day to help hemorrhoids   . metFORMIN (GLUCOPHAGE-XR) 500 MG 24 hr tablet Take 2 tablets (1,000 mg total) by mouth 2 (two) times daily. 06/26/2019: 2 QAM, 1 QPM  . Multiple Vitamin (MULTIVITAMIN) tablet Take 1 tablet by mouth daily.   . Omega-3 1000 MG CAPS Take by mouth.   . Probiotic Product (PROBIOTIC DAILY PO) Take 1 tablet by mouth daily.   . sertraline (ZOLOFT) 25 MG tablet TAKE 1 TABLET BY MOUTH  DAILY   . timolol (BETIMOL) 0.5 % ophthalmic solution 1 drop 2 (two) times daily.   Marland Kitchen amLODipine (NORVASC) 5 MG tablet TAKE 1 TABLET BY MOUTH  TWICE DAILY   . Cholecalciferol (VITAMIN D) 2000 units tablet Take by mouth.   Marland Kitchen glucose blood test strip USE TO CHECK BLOOD SUGAR  TWO TIMES DAILY   . Lancet Devices (ONE TOUCH DELICA LANCING DEV) MISC Use twice daily Dx: 250.00    No  facility-administered encounter medications on file as of 06/26/2019.      Goals Addressed            This Visit's Progress     Patient Stated   . "We want her to stay healthy" (pt-stated)       Current Barriers:  . Diabetes: uncontrolled, complicated by diabetic ulcer; most recent A1c 8.4%  . Current antihyperglycemic regimen: metformin XR 1000 mg QAM, 500 mg QPM o Increased to 4 tablets daily at last visit; [name] notes that patient developed diarrhea about 5 days after, so resumed 3 tabs daily after that. No recurrence of diarrhea. . Denies hypoglycemia . Does note that patient complains about polyuria, and occasionally not being able to make it to the bathroom in time . Current exercise:  o Very active: Wakes up around 6:30- 7 am (lives alone); daughter comes up around 10 am; waters plants; run errands together . Current blood glucose readings:   Fasting Night  22-Nov 186 267  23-Nov 194 222  24-Nov 154   25-Nov 159 247  26-Nov 146 275  27-Nov 216 269  28-Nov 173 226  29-Nov 163 241  30-Nov 156 267  2-Nov 224 152  3-Dec 158 220  4-Dec 180 274  5-Dec 187   6-Dec 162 281  7-Dec 168    206 245   . Cardiovascular risk reduction: o Current hypertensive regimen: amlodipine 5 mg BID o Current hyperlipidemia regimen: omega 3  fatty acids; TG 169 on last check, but could have been associated w/ elevated A1c  Pharmacist Clinical Goal(s):  Marland Kitchen Over the next 90 days, patient will work with PharmD and primary care provider to address optimized glycemic regimen  Interventions: . Comprehensive medication review performed, medication list updated in electronic medical record . Discussed relaxed goal A1c of <8%, given chronic foot ulcer and complaints of polyuria. Discussed pros and cons of DPP4 vs SGLT2; given patient's complaints of polyuria at baseline, would likely lean more towards DPP4. Decided to defer any medication changes until upcoming A1c and PCP appointment.  . Will  update metformin prescription to reflect current usage.   Patient Self Care Activities:  . Patient will check blood glucose BID , document, and provide at future appointments . Patient will take medications as prescribed . Patient will report any questions or concerns to provider   Please see past updates related to this goal by clicking on the "Past Updates" button in the selected goal           Plan: - Patient has PCP f/u in ~3 weeks; scheduled f/u ~4 weeks after that for medication management f/u  Catie Darnelle Maffucci, PharmD, [place], [place] Pharmacist [place] [phone]

## 2019-06-26 NOTE — Progress Notes (Signed)
Reviewed information.  Agree with plan.    Dr Nicki Reaper

## 2019-06-27 ENCOUNTER — Encounter: Payer: Self-pay | Admitting: Podiatry

## 2019-06-27 ENCOUNTER — Other Ambulatory Visit: Payer: Self-pay

## 2019-06-27 ENCOUNTER — Ambulatory Visit (INDEPENDENT_AMBULATORY_CARE_PROVIDER_SITE_OTHER): Payer: Medicare Other | Admitting: Podiatry

## 2019-06-27 DIAGNOSIS — E0843 Diabetes mellitus due to underlying condition with diabetic autonomic (poly)neuropathy: Secondary | ICD-10-CM | POA: Diagnosis not present

## 2019-06-27 DIAGNOSIS — L97522 Non-pressure chronic ulcer of other part of left foot with fat layer exposed: Secondary | ICD-10-CM | POA: Diagnosis not present

## 2019-06-30 NOTE — Progress Notes (Signed)
   Subjective:  85 year old female with PMHx of DM presenting today for follow up evaluation of an ulceration of the left foot. She states she is doing well. She reports some mild drainage. She has been using the Aquacel and states the met pads seem to be helping the pain. There are no aggravating factors noted. Patient is here for further evaluation and treatment.   Past Medical History:  Diagnosis Date  . Allergy   . Chicken pox   . CVA (cerebral vascular accident) ([place])   . Diabetes mellitus ([place])   . Hypercholesterolemia   . Hypertension       Objective/Physical Exam General: The patient is alert and oriented x3 in no acute distress.  Dermatology:  Wound #1 noted to the sub-first MPJ of the left foot measuring 1.0 x 1.0 x 0.2 cm (LxWxD).     To the noted ulceration(s), there is no eschar. There is a moderate amount of slough, fibrin, and necrotic tissue noted. Granulation tissue and wound base is red. There is a minimal amount of serosanguineous drainage noted. There is no exposed bone muscle-tendon ligament or joint. There is no malodor. Periwound integrity is intact. Skin is warm, dry and supple bilateral lower extremities.   Vascular: Palpable pedal pulses bilaterally. No edema or erythema noted. Capillary refill within normal limits.  Neurological: Epicritic and protective threshold diminished bilaterally.   Musculoskeletal Exam: Range of motion within normal limits to all pedal and ankle joints bilateral. Muscle strength 5/5 in all groups bilateral.   Assessment: #1 ulceration of the sub-first MPJ of the left foot secondary to diabetes mellitus #2 diabetes mellitus w/ peripheral neuropathy  Plan of Care:  #1 Patient was evaluated. #2 Medically necessary excisional debridement including subcutaneous tissue was performed using a tissue nipper and a chisel blade. Excisional debridement of all the necrotic nonviable tissue down to healthy bleeding viable tissue was performed  with post-debridement measurements same as pre-. #3 The wound was cleansed and dry sterile dressing applied. #4 Continue using Aquacel Ag and a dry sterile dressing daily.  #5 Met pads placed onto DM insoles.  #6 Return to clinic in 3 weeks.     [name], DPM [facility]  Dr. [name], [place]                                        [place], [place] [address]                Office [phone]  Fax [phone]

## 2019-07-10 ENCOUNTER — Other Ambulatory Visit: Payer: Medicare Other

## 2019-07-12 ENCOUNTER — Ambulatory Visit (INDEPENDENT_AMBULATORY_CARE_PROVIDER_SITE_OTHER): Payer: Medicare Other

## 2019-07-12 ENCOUNTER — Ambulatory Visit: Payer: Medicare Other | Admitting: Internal Medicine

## 2019-07-12 ENCOUNTER — Other Ambulatory Visit: Payer: Self-pay

## 2019-07-12 VITALS — Ht 65.0 in | Wt 115.0 lb

## 2019-07-12 DIAGNOSIS — Z0000 Encounter for general adult medical examination without abnormal findings: Secondary | ICD-10-CM | POA: Diagnosis not present

## 2019-07-12 DIAGNOSIS — L97422 Non-pressure chronic ulcer of left heel and midfoot with fat layer exposed: Secondary | ICD-10-CM | POA: Diagnosis not present

## 2019-07-12 DIAGNOSIS — E1140 Type 2 diabetes mellitus with diabetic neuropathy, unspecified: Secondary | ICD-10-CM | POA: Diagnosis not present

## 2019-07-12 DIAGNOSIS — F419 Anxiety disorder, unspecified: Secondary | ICD-10-CM | POA: Diagnosis not present

## 2019-07-12 DIAGNOSIS — Z7984 Long term (current) use of oral hypoglycemic drugs: Secondary | ICD-10-CM

## 2019-07-12 DIAGNOSIS — Z8673 Personal history of transient ischemic attack (TIA), and cerebral infarction without residual deficits: Secondary | ICD-10-CM

## 2019-07-12 DIAGNOSIS — I10 Essential (primary) hypertension: Secondary | ICD-10-CM | POA: Diagnosis not present

## 2019-07-12 DIAGNOSIS — E11621 Type 2 diabetes mellitus with foot ulcer: Secondary | ICD-10-CM | POA: Diagnosis not present

## 2019-07-12 NOTE — Progress Notes (Addendum)
Subjective:   Angela Patterson is a 85 y.o. female who presents for Medicare Annual (Subsequent) preventive examination.  Review of Systems:  No ROS.  Medicare Wellness Virtual Visit.  Visual/audio telehealth visit, UTA vital signs.   See social history for additional risk factors.   Cardiac Risk Factors include: advanced age (>16men, >27 women);diabetes mellitus;hypertension     Objective:     Vitals: Ht 5\' 5"  (1.651 m)   Wt 115 lb (52.2 kg)   LMP 07/21/1966   BMI 19.14 kg/m   Body mass index is 19.14 kg/m.  Advanced Directives 07/12/2019 02/16/2019 07/06/2018 07/05/2017 11/12/2015  Does Patient Have a Medical Advance Directive? No No Yes Yes No  Type of Advance Directive - Public librarian;Living will [place];Living will -  Does patient want to make changes to medical advance directive? - - No - Patient declined No - Patient declined -  Copy of [place] in Chart? - - No - copy requested No - copy requested -  Would patient like information on creating a medical advance directive? Yes (MAU/Ambulatory/Procedural Areas - Information given) - - - -    Tobacco Social History   Tobacco Use  Smoking Status Never Smoker  Smokeless Tobacco Never Used     Counseling given: Not Answered   Clinical Intake:  Pre-visit preparation completed: Yes        Diabetes: No  How often do you need to have someone help you when you read instructions, pamphlets, or other written materials from your doctor or pharmacy?: 1 - Never  Interpreter Needed?: No     Past Medical History:  Diagnosis Date  . Allergy   . Chicken pox   . CVA (cerebral vascular accident) ([place])   . Diabetes mellitus ([place])   . Hypercholesterolemia   . Hypertension    Past Surgical History:  Procedure Laterality Date  . ABDOMINAL HYSTERECTOMY  1971   excessive bleeding  . APPENDECTOMY  age 61  . BREAST LUMPECTOMY  1958   benign  . DILATION AND  CURETTAGE OF UTERUS  1970   Family History  Problem Relation Age of Onset  . Liver cancer Father   . Stroke Mother   . Heart attack Brother   . Hypertension Daughter   . Hypertension Son   . Hypertension Daughter   . Cancer Grandchild        breast  . Diabetes Grandchild   . Breast cancer Neg Hx   . Colon cancer Neg Hx    Social History   Socioeconomic History  . Marital status: Widowed    Spouse name: Not on file  . Number of children: 3  . Years of education: Not on file  . Highest education level: Not on file  Occupational History  . Not on file  Tobacco Use  . Smoking status: Never Smoker  . Smokeless tobacco: Never Used  Substance and Sexual Activity  . Alcohol use: No    Alcohol/week: 0.0 standard drinks  . Drug use: No  . Sexual activity: Not on file  Other Topics Concern  . Not on file  Social History Narrative  . Not on file   Social Determinants of Health   Financial Resource Strain:   . Difficulty of Paying Living Expenses: Not on file  Food Insecurity:   . Worried About Charity fundraiser in the Last Year: Not on file  . Ran Out of Food in the Last  Year: Not on file  Transportation Needs:   . Lack of Transportation (Medical): Not on file  . Lack of Transportation (Non-Medical): Not on file  Physical Activity:   . Days of Exercise per Week: Not on file  . Minutes of Exercise per Session: Not on file  Stress:   . Feeling of Stress : Not on file  Social Connections:   . Frequency of Communication with Friends and Family: Not on file  . Frequency of Social Gatherings with Friends and Family: Not on file  . Attends Religious Services: Not on file  . Active Member of Clubs or Organizations: Not on file  . Attends Archivist Meetings: Not on file  . Marital Status: Not on file    Outpatient Encounter Medications as of 07/12/2019  Medication Sig  . amLODipine (NORVASC) 5 MG tablet TAKE 1 TABLET BY MOUTH  TWICE DAILY  . Cholecalciferol  (VITAMIN D) 2000 units tablet Take by mouth.  . clopidogrel (PLAVIX) 75 MG tablet TAKE 1 TABLET BY MOUTH  DAILY  . glucose blood test strip USE TO CHECK BLOOD SUGAR  TWO TIMES DAILY  . hydrocortisone (PROCTOCORT) 1 % CREA Apply thin layer around rectal opening 2 times per day to help hemorrhoids  . Lancet Devices (ONE TOUCH DELICA LANCING DEV) MISC Use twice daily Dx: 250.00  . metFORMIN (GLUCOPHAGE-XR) 500 MG 24 hr tablet Take 2 tablets (1,000 mg total) by mouth 2 (two) times daily.  . Multiple Vitamin (MULTIVITAMIN) tablet Take 1 tablet by mouth daily.  . Omega-3 1000 MG CAPS Take by mouth.  . Probiotic Product (PROBIOTIC DAILY PO) Take 1 tablet by mouth daily.  . sertraline (ZOLOFT) 25 MG tablet TAKE 1 TABLET BY MOUTH  DAILY  . timolol (BETIMOL) 0.5 % ophthalmic solution 1 drop 2 (two) times daily.   No facility-administered encounter medications on file as of 07/12/2019.    Activities of Daily Living In your present state of health, do you have any difficulty performing the following activities: 07/12/2019  Hearing? Y  Comment Hearing aids  Vision? N  Difficulty concentrating or making decisions? N  Walking or climbing stairs? Y  Comment Unsteady gait. Cane in use.  Dressing or bathing? N  Doing errands, shopping? Y  Comment She does not drive.  Preparing Food and eating ? Y  Comment Daughter provides meals, meals on wheels assist. Microwave in use only. Self feeds.  Using the Toilet? N  In the past six months, have you accidently leaked urine? Y  Comment Managed with daily pad  Do you have problems with loss of bowel control? N  Managing your Medications? N  Managing your Finances? Y  Comment Daughter manages  Housekeeping or managing your Housekeeping? Y  Comment Daughter mananges  Some recent data might be hidden    Patient Care Team: Einar Pheasant, MD as PCP - General (Internal Medicine) De Hollingshead, [facility] as Pharmacist (Pharmacist)    Assessment:   This  is a routine wellness examination for Angela Patterson.  Nurse connected with patient and daughter Angela Patterson (HIPAA compliant) 07/12/19 at 10:00 AM EST by a telephone enabled telemedicine application and verified that I am speaking with the correct person using two identifiers. Patient stated full name and DOB. Patient gave permission to continue with virtual visit. Patient's location was at home and Nurse's location was at [place] office.   Patient is alert and oriented x3. Patient denies difficulty focusing or concentrating. Patient likes to read a little, completes word  search puzzles and takes rides in the car with daughter for brain stimulation.   Health Maintenance Due: -Dexa Scan- postponed; follow up with pcp. -Foot Exam- daughter changes bandage once per week. Followed by podiatry.  -Hgb A1c-04/17/19 (8.4); ordered -Urine Microalbumin- 06/22/18 (4.3); ordered See completed HM at the end of note.   Eye: Visual acuity not assessed. Virtual visit. Followed by their ophthalmologist. Wears glasses.   Dental: UTD  Hearing: Hearing aids- yes  Safety:  Patient feels safe at home- yes Patient does have smoke detectors at home- yes Patient does wear sunscreen or protective clothing when in direct sunlight - yes Patient does wear seat belt when in a moving vehicle - yes Patient drives- no Adequate lighting in walkways free from debris- yes Grab bars and handrails used as appropriate- yes Ambulates with an assistive device- yes  Lives alone; daughter Angela Patterson assists as needed.   Social: Alcohol intake - no      Smoking history- never  Smokers in home? none Illicit drug use? none  Medication: Taking as directed and without issues.  Pill box in use -yes  Self managed - yes   Covid-19: Precautions and sickness symptoms discussed. Wears mask, social distancing, hand hygiene as appropriate.   Activities of Daily Living Patient denies needing assistance with: feeding themselves, getting from  bed to chair, getting to the toilet, bathing/showering, dressing. Assisted by daughter Angela Patterson with managing money, meal prep, household chores as needed.   Discussed the importance of a healthy diet, water intake and the benefits of aerobic exercise.   Physical activity- chair exercises, walking  Diet:  Regular Water: good intake  Other Providers Patient Care Team: Einar Pheasant, MD as PCP - General (Internal Medicine) De Hollingshead, [facility] as Pharmacist (Pharmacist)  Exercise Activities and Dietary recommendations Current Exercise Habits: Home exercise routine, Type of exercise: walking(chair exercises), Intensity: Mild  Goals      Patient Stated   . "We want her to stay healthy" (pt-stated)     Current Barriers:  . Diabetes: uncontrolled, complicated by diabetic ulcer; most recent A1c 8.4%  . Current antihyperglycemic regimen: metformin XR 1000 mg QAM, 500 mg QPM o Increased to 4 tablets daily at last visit; Angela Patterson notes that patient developed diarrhea about 5 days after, so resumed 3 tabs daily after that. No recurrence of diarrhea. . Denies hypoglycemia . Does note that patient complains about polyuria, and occasionally not being able to make it to the bathroom in time . Current exercise:  o Very active: Wakes up around 6:30- 7 am (lives alone); daughter comes up around 10 am; waters plants; run errands together . Current blood glucose readings:   Fasting Night  22-Nov 186 267  23-Nov 194 222  24-Nov 154   25-Nov 159 247  26-Nov 146 275  27-Nov 216 269  28-Nov 173 226  29-Nov 163 241  30-Nov 156 267  2-Nov 224 152  3-Dec 158 220  4-Dec 180 274  5-Dec 187   6-Dec 162 281  7-Dec 168    206 245   . Cardiovascular risk reduction: o Current hypertensive regimen: amlodipine 5 mg BID o Current hyperlipidemia regimen: omega 3 fatty acids; TG 169 on last check, but could have been associated w/ elevated A1c  Pharmacist Clinical Goal(s):  Marland Kitchen Over the next 90 days,  patient will work with PharmD and primary care provider to address optimized glycemic regimen  Interventions: . Comprehensive medication review performed, medication list updated in electronic medical record .  Discussed relaxed goal A1c of <8%, given chronic foot ulcer and complaints of polyuria. Discussed pros and cons of DPP4 vs SGLT2; given patient's complaints of polyuria at baseline, would likely lean more towards DPP4. Decided to defer any medication changes until upcoming A1c and PCP appointment.  . Will update metformin prescription to reflect current usage.   Patient Self Care Activities:  . Patient will check blood glucose BID , document, and provide at future appointments . Patient will take medications as prescribed . Patient will report any questions or concerns to provider   Please see past updates related to this goal by clicking on the "Past Updates" button in the selected goal           Fall Risk Fall Risk  07/12/2019 07/06/2018 07/05/2017 03/02/2017 10/19/2016  Falls in the past year? 1 0 Yes No No  Number falls in past yr: - - 1 - -  Injury with Fall? - - No - -  Follow up Falls prevention discussed - - - -    Timed Get Up and Go performed: no, virtual visit  Depression Screen PHQ 2/9 Scores 07/12/2019 07/06/2018 07/05/2017 03/02/2017  PHQ - 2 Score 0 0 0 0  PHQ- 9 Score - - 0 -     Cognitive Function     6CIT Screen 07/06/2018 07/05/2017  What Year? 0 points 0 points  What month? 0 points 0 points  What time? 0 points 0 points  Count back from 20 0 points 0 points  Months in reverse 0 points 0 points  Repeat phrase 0 points 0 points  Total Score 0 0    Immunization History  Administered Date(s) Administered  . DTaP 03/21/2012  . Fluad Quad(high Dose 65+) 04/19/2019  . Influenza Split 03/21/2012, 04/03/2014  . Influenza, High Dose Seasonal PF 04/30/2017, 05/02/2018  . Influenza-Unspecified 04/09/2015, 04/14/2016, 04/28/2017  . Pneumococcal  Conjugate-13 07/09/2014  . Pneumococcal Polysaccharide-23 07/09/2012  . Tdap 02/16/2019   Screening Tests Health Maintenance  Topic Date Due  . URINE MICROALBUMIN  06/23/2019  . DEXA SCAN  07/11/2020 (Originally 03/06/1988)  . HEMOGLOBIN A1C  10/15/2019  . OPHTHALMOLOGY EXAM  03/12/2020  . FOOT EXAM  06/26/2020  . TETANUS/TDAP  02/15/2029  . INFLUENZA VACCINE  Completed  . PNA vac Low Risk Adult  Completed  . MAMMOGRAM  Discontinued      Plan:   Keep all routine maintenance appointments.  Next scheduled lab 07/25/19 @ 9:00 Follow up 07/26/19 @ 930  Medicare Attestation I have personally reviewed: The patient's medical and social history Their use of alcohol, tobacco or illicit drugs Their current medications and supplements The patient's functional ability including ADLs,fall risks, home safety risks, cognitive, and hearing and visual impairment Diet and physical activities Evidence for depression   I have reviewed and discussed with patient certain preventive protocols, quality metrics, and best practice recommendations.     Varney Biles, LPN  [id number]   Reviewed above information.  Agree with continued fu with Catie monitoring sugars.  Agree with assessment and plan.   Dr Arva Chafe

## 2019-07-15 DIAGNOSIS — I10 Essential (primary) hypertension: Secondary | ICD-10-CM | POA: Diagnosis not present

## 2019-07-15 DIAGNOSIS — Z7984 Long term (current) use of oral hypoglycemic drugs: Secondary | ICD-10-CM | POA: Diagnosis not present

## 2019-07-15 DIAGNOSIS — F419 Anxiety disorder, unspecified: Secondary | ICD-10-CM | POA: Diagnosis not present

## 2019-07-15 DIAGNOSIS — L97422 Non-pressure chronic ulcer of left heel and midfoot with fat layer exposed: Secondary | ICD-10-CM | POA: Diagnosis not present

## 2019-07-15 DIAGNOSIS — E11621 Type 2 diabetes mellitus with foot ulcer: Secondary | ICD-10-CM | POA: Diagnosis not present

## 2019-07-15 DIAGNOSIS — E1140 Type 2 diabetes mellitus with diabetic neuropathy, unspecified: Secondary | ICD-10-CM | POA: Diagnosis not present

## 2019-07-15 DIAGNOSIS — Z8673 Personal history of transient ischemic attack (TIA), and cerebral infarction without residual deficits: Secondary | ICD-10-CM | POA: Diagnosis not present

## 2019-07-18 ENCOUNTER — Ambulatory Visit: Payer: Medicare Other | Admitting: Podiatry

## 2019-07-18 ENCOUNTER — Other Ambulatory Visit: Payer: Self-pay

## 2019-07-18 ENCOUNTER — Encounter: Payer: Self-pay | Admitting: Podiatry

## 2019-07-18 ENCOUNTER — Ambulatory Visit (INDEPENDENT_AMBULATORY_CARE_PROVIDER_SITE_OTHER): Payer: Medicare Other | Admitting: Podiatry

## 2019-07-18 DIAGNOSIS — L97522 Non-pressure chronic ulcer of other part of left foot with fat layer exposed: Secondary | ICD-10-CM | POA: Diagnosis not present

## 2019-07-18 DIAGNOSIS — E0843 Diabetes mellitus due to underlying condition with diabetic autonomic (poly)neuropathy: Secondary | ICD-10-CM

## 2019-07-19 ENCOUNTER — Other Ambulatory Visit: Payer: Self-pay

## 2019-07-19 DIAGNOSIS — F419 Anxiety disorder, unspecified: Secondary | ICD-10-CM | POA: Diagnosis not present

## 2019-07-19 DIAGNOSIS — L97422 Non-pressure chronic ulcer of left heel and midfoot with fat layer exposed: Secondary | ICD-10-CM | POA: Diagnosis not present

## 2019-07-19 DIAGNOSIS — E1140 Type 2 diabetes mellitus with diabetic neuropathy, unspecified: Secondary | ICD-10-CM | POA: Diagnosis not present

## 2019-07-19 DIAGNOSIS — Z7984 Long term (current) use of oral hypoglycemic drugs: Secondary | ICD-10-CM | POA: Diagnosis not present

## 2019-07-19 DIAGNOSIS — I10 Essential (primary) hypertension: Secondary | ICD-10-CM | POA: Diagnosis not present

## 2019-07-19 DIAGNOSIS — E11621 Type 2 diabetes mellitus with foot ulcer: Secondary | ICD-10-CM | POA: Diagnosis not present

## 2019-07-24 NOTE — Progress Notes (Signed)
   Subjective:  86 year old female with PMHx of DM presenting today for follow up evaluation of an ulceration of the left foot. She states she is doing well. She reports some mild drainage and bleeding over the past week or so but states it has since resolved. She has been using the Aquacel Ag as directed. Patient is here for further evaluation and treatment.   Past Medical History:  Diagnosis Date  . Allergy   . Chicken pox   . CVA (cerebral vascular accident) ([place])   . Diabetes mellitus ([place])   . Hypercholesterolemia   . Hypertension       Objective/Physical Exam General: The patient is alert and oriented x3 in no acute distress.  Dermatology:  Wound #1 noted to the sub-first MPJ of the left foot measuring 1.0 x 1.0 x 0.2 cm (LxWxD).     To the noted ulceration(s), there is no eschar. There is a moderate amount of slough, fibrin, and necrotic tissue noted. Granulation tissue and wound base is red. There is a minimal amount of serosanguineous drainage noted. There is no exposed bone muscle-tendon ligament or joint. There is no malodor. Periwound integrity is intact. Skin is warm, dry and supple bilateral lower extremities.   Vascular: Palpable pedal pulses bilaterally. No edema or erythema noted. Capillary refill within normal limits.  Neurological: Epicritic and protective threshold diminished bilaterally.   Musculoskeletal Exam: Range of motion within normal limits to all pedal and ankle joints bilateral. Muscle strength 5/5 in all groups bilateral.   Assessment: #1 ulceration of the sub-first MPJ of the left foot secondary to diabetes mellitus #2 diabetes mellitus w/ peripheral neuropathy  Plan of Care:  #1 Patient was evaluated. #2 Medically necessary excisional debridement including subcutaneous tissue was performed using a tissue nipper and a chisel blade. Excisional debridement of all the necrotic nonviable tissue down to healthy bleeding viable tissue was performed with  post-debridement measurements same as pre-. #3 The wound was cleansed and dry sterile dressing applied. #4 Continue using Aquacel Ag and a dry sterile dressing daily.  #5 Met pads placed onto DM insoles.  #6 Return to clinic in 3 weeks.     [name], DPM [facility]  Dr. [name], [address]                                        [place], [place] [address]                Office [phone]  Fax [phone]

## 2019-07-25 ENCOUNTER — Other Ambulatory Visit (INDEPENDENT_AMBULATORY_CARE_PROVIDER_SITE_OTHER): Payer: Medicare Other

## 2019-07-25 ENCOUNTER — Other Ambulatory Visit: Payer: Self-pay

## 2019-07-25 DIAGNOSIS — E1140 Type 2 diabetes mellitus with diabetic neuropathy, unspecified: Secondary | ICD-10-CM | POA: Diagnosis not present

## 2019-07-25 DIAGNOSIS — D696 Thrombocytopenia, unspecified: Secondary | ICD-10-CM | POA: Diagnosis not present

## 2019-07-25 DIAGNOSIS — E7800 Pure hypercholesterolemia, unspecified: Secondary | ICD-10-CM | POA: Diagnosis not present

## 2019-07-25 LAB — CBC WITH DIFFERENTIAL/PLATELET
Basophils Absolute: 0.1 10*3/uL (ref 0.0–0.1)
Basophils Relative: 0.7 % (ref 0.0–3.0)
Eosinophils Absolute: 0.1 10*3/uL (ref 0.0–0.7)
Eosinophils Relative: 1.6 % (ref 0.0–5.0)
HCT: 43 % (ref 36.0–46.0)
Hemoglobin: 14.4 g/dL (ref 12.0–15.0)
Lymphocytes Relative: 23 % (ref 12.0–46.0)
Lymphs Abs: 1.9 10*3/uL (ref 0.7–4.0)
MCHC: 33.5 g/dL (ref 30.0–36.0)
MCV: 93.6 fl (ref 78.0–100.0)
Monocytes Absolute: 0.8 10*3/uL (ref 0.1–1.0)
Monocytes Relative: 9.2 % (ref 3.0–12.0)
Neutro Abs: 5.4 10*3/uL (ref 1.4–7.7)
Neutrophils Relative %: 65.5 % (ref 43.0–77.0)
Platelets: 164 10*3/uL (ref 150.0–400.0)
RBC: 4.59 Mil/uL (ref 3.87–5.11)
RDW: 13.8 % (ref 11.5–15.5)
WBC: 8.3 10*3/uL (ref 4.0–10.5)

## 2019-07-25 LAB — LIPID PANEL
Cholesterol: 157 mg/dL (ref 0–200)
HDL: 41.3 mg/dL (ref >39.00)
LDL Cholesterol: 83 mg/dL (ref 0–99)
NonHDL: 115.88
Total CHOL/HDL Ratio: 4
Triglycerides: 162 mg/dL — ABNORMAL HIGH (ref 0.0–149.0)
VLDL: 32.4 mg/dL (ref 0.0–40.0)

## 2019-07-25 LAB — HEPATIC FUNCTION PANEL
ALT: 10 U/L (ref 0–35)
AST: 16 U/L (ref 0–37)
Albumin: 4.3 g/dL (ref 3.5–5.2)
Alkaline Phosphatase: 56 U/L (ref 39–117)
Bilirubin, Direct: 0.1 mg/dL (ref 0.0–0.3)
Total Bilirubin: 0.6 mg/dL (ref 0.2–1.2)
Total Protein: 7.6 g/dL (ref 6.0–8.3)

## 2019-07-25 LAB — BASIC METABOLIC PANEL
BUN: 16 mg/dL (ref 6–23)
CO2: 27 mEq/L (ref 19–32)
Calcium: 9.7 mg/dL (ref 8.4–10.5)
Chloride: 101 mEq/L (ref 96–112)
Creatinine, Ser: 0.64 mg/dL (ref 0.40–1.20)
GFR: 85.97 mL/min (ref >60.00)
Glucose, Bld: 167 mg/dL — ABNORMAL HIGH (ref 70–99)
Potassium: 3.9 mEq/L (ref 3.5–5.1)
Sodium: 138 mEq/L (ref 135–145)

## 2019-07-25 LAB — HEMOGLOBIN A1C: Hgb A1c MFr Bld: 7.1 % — ABNORMAL HIGH (ref 4.6–6.5)

## 2019-07-25 LAB — MICROALBUMIN / CREATININE URINE RATIO
Creatinine,U: 79.6 mg/dL
Microalb Creat Ratio: 2.9 mg/g (ref 0.0–30.0)
Microalb, Ur: 2.3 mg/dL — ABNORMAL HIGH (ref 0.0–1.9)

## 2019-07-25 NOTE — Addendum Note (Signed)
Addended by: Leeanne Rio on: 07/25/2019 09:18 AM   Modules accepted: Orders

## 2019-07-25 NOTE — Addendum Note (Signed)
Addended by: Leeanne Rio on: 07/25/2019 12:04 PM   Modules accepted: Orders

## 2019-07-26 ENCOUNTER — Ambulatory Visit (INDEPENDENT_AMBULATORY_CARE_PROVIDER_SITE_OTHER): Payer: Medicare Other | Admitting: Internal Medicine

## 2019-07-26 ENCOUNTER — Encounter: Payer: Self-pay | Admitting: Internal Medicine

## 2019-07-26 ENCOUNTER — Telehealth: Payer: Self-pay | Admitting: Internal Medicine

## 2019-07-26 VITALS — Ht 65.0 in | Wt 115.0 lb

## 2019-07-26 DIAGNOSIS — Z7984 Long term (current) use of oral hypoglycemic drugs: Secondary | ICD-10-CM | POA: Diagnosis not present

## 2019-07-26 DIAGNOSIS — I10 Essential (primary) hypertension: Secondary | ICD-10-CM

## 2019-07-26 DIAGNOSIS — E7800 Pure hypercholesterolemia, unspecified: Secondary | ICD-10-CM | POA: Diagnosis not present

## 2019-07-26 DIAGNOSIS — L97422 Non-pressure chronic ulcer of left heel and midfoot with fat layer exposed: Secondary | ICD-10-CM | POA: Diagnosis not present

## 2019-07-26 DIAGNOSIS — Z8673 Personal history of transient ischemic attack (TIA), and cerebral infarction without residual deficits: Secondary | ICD-10-CM

## 2019-07-26 DIAGNOSIS — F419 Anxiety disorder, unspecified: Secondary | ICD-10-CM | POA: Diagnosis not present

## 2019-07-26 DIAGNOSIS — L97529 Non-pressure chronic ulcer of other part of left foot with unspecified severity: Secondary | ICD-10-CM

## 2019-07-26 DIAGNOSIS — E11621 Type 2 diabetes mellitus with foot ulcer: Secondary | ICD-10-CM

## 2019-07-26 DIAGNOSIS — R197 Diarrhea, unspecified: Secondary | ICD-10-CM

## 2019-07-26 DIAGNOSIS — D696 Thrombocytopenia, unspecified: Secondary | ICD-10-CM

## 2019-07-26 DIAGNOSIS — E1140 Type 2 diabetes mellitus with diabetic neuropathy, unspecified: Secondary | ICD-10-CM | POA: Diagnosis not present

## 2019-07-26 DIAGNOSIS — L97509 Non-pressure chronic ulcer of other part of unspecified foot with unspecified severity: Secondary | ICD-10-CM

## 2019-07-26 NOTE — Assessment & Plan Note (Signed)
Improved

## 2019-07-26 NOTE — Progress Notes (Signed)
Patient ID: Angela Patterson, female   DOB: 1921-07-19, 86 y.o.   MRN: [id number]   Virtual Visit via video Note  This visit type was conducted due to national recommendations for restrictions regarding the COVID-19 pandemic (e.g. social distancing).  This format is felt to be most appropriate for this patient at this time.  All issues noted in this document were discussed and addressed.  No physical exam was performed (except for noted visual exam findings with Video Visits).   I connected with [name] by a video enabled telemedicine application and verified that I am speaking with the correct person using two identifiers. Location patient: home Location provider: work Persons participating in the virtual visit: patient, provider and pts daughter [name].   I discussed the limitations, risks, security and privacy concerns of performing an evaluation and management service by video and the availability of in person appointments. The patient expressed understanding and agreed to proceed.   Reason for visit: scheduled follow up.    HPI: History obtained from pt and her daughter.  She reports she is doing relatively well.  Still following with Dr Amalia Hailey - foot ulcer.  States she is eating well.  Good appetite.  No chest pain or sob reported.  No abdominal pain reported.  Bowels better.  Diarrhea better.  Taking zoloft.  Stable.  Discussed labs.  a1c improved.  Did hit her head on window sill.  Bent over and hit her head.  No fall.  No headache.  No change in dizziness or light headaches.  Breathing stable.     ROS: See pertinent positives and negatives per HPI.  Past Medical History:  Diagnosis Date  . Allergy   . Chicken pox   . CVA (cerebral vascular accident) ([place])   . Diabetes mellitus ([place])   . Hypercholesterolemia   . Hypertension     Past Surgical History:  Procedure Laterality Date  . ABDOMINAL HYSTERECTOMY  1971   excessive bleeding  . APPENDECTOMY  age 70  . BREAST  LUMPECTOMY  1958   benign  . DILATION AND CURETTAGE OF UTERUS  1970    Family History  Problem Relation Age of Onset  . Liver cancer Father   . Stroke Mother   . Heart attack Brother   . Hypertension Daughter   . Hypertension Son   . Hypertension Daughter   . Cancer Grandchild        breast  . Diabetes Grandchild   . Breast cancer Neg Hx   . Colon cancer Neg Hx     SOCIAL HX: reviewed.    Current Outpatient Medications:  .  amLODipine (NORVASC) 5 MG tablet, TAKE 1 TABLET BY MOUTH  TWICE DAILY, Disp: 180 tablet, Rfl: 3 .  Cholecalciferol (VITAMIN D) 2000 units tablet, Take by mouth., Disp: , Rfl:  .  clopidogrel (PLAVIX) 75 MG tablet, TAKE 1 TABLET BY MOUTH  DAILY, Disp: 90 tablet, Rfl: 3 .  glucose blood test strip, USE TO CHECK BLOOD SUGAR  TWO TIMES DAILY, Disp: 200 each, Rfl: 1 .  hydrocortisone (PROCTOCORT) 1 % CREA, Apply thin layer around rectal opening 2 times per day to help hemorrhoids, Disp: 1 Tube, Rfl: 1 .  Lancet Devices (ONE TOUCH DELICA LANCING DEV) MISC, Use twice daily Dx: 250.00, Disp: 100 each, Rfl: 5 .  metFORMIN (GLUCOPHAGE-XR) 500 MG 24 hr tablet, Take 2 tablets (1,000 mg total) by mouth 2 (two) times daily., Disp: 360 tablet, Rfl: 3 .  Multiple Vitamin (MULTIVITAMIN)  tablet, Take 1 tablet by mouth daily., Disp: , Rfl:  .  Omega-3 1000 MG CAPS, Take by mouth., Disp: , Rfl:  .  Probiotic Product (PROBIOTIC DAILY PO), Take 1 tablet by mouth daily., Disp: , Rfl:  .  sertraline (ZOLOFT) 25 MG tablet, TAKE 1 TABLET BY MOUTH  DAILY, Disp: 90 tablet, Rfl: 3 .  timolol (BETIMOL) 0.5 % ophthalmic solution, 1 drop 2 (two) times daily., Disp: , Rfl:   EXAM:  GENERAL: alert, oriented, appears well and in no acute distress  HEENT: atraumatic, conjunttiva clear, no obvious abnormalities on inspection of external nose and ears  NECK: normal movements of the head and neck  LUNGS: on inspection no signs of respiratory distress, breathing rate appears normal, no  obvious gross SOB, gasping or wheezing  CV: no obvious cyanosis  PSYCH/NEURO: pleasant and cooperative, no obvious depression or anxiety, speech and thought processing grossly intact  ASSESSMENT AND PLAN:  Discussed the following assessment and plan:  Hypercholesterolemia Follow lipid panel.    Anxiety Taking one whole tablet now.  Stable.  Follow.  Continue current dose.    Diabetes mellitus On metformin.  Tolerating.  Diarrhea better.  Recent a1c improved.  Follow met b and a1c.   Diarrhea Improved.   Essential (primary) hypertension Blood pressure under good control.  Continue same medication regimen.  Follow pressures.  Follow metabolic panel.    Foot ulcer ([place]) Seeing Dr Amalia Hailey.  No increased pain.  Follow.   History of CVA (cerebrovascular accident) Maintained on plavix.  Doing well.  Follow.   Thrombocytopenia (HCC) CBC just checked - platelet count wnl.    Type 2 diabetes mellitus (HCC) a1c improved.  Tolerating metformin.  Follow met b and a1c.    Orders Placed This Encounter  Procedures  . Hemoglobin A1c    Standing Status:   Future    Standing Expiration Date:   07/25/2020  . Hepatic function panel    Standing Status:   Future    Standing Expiration Date:   07/25/2020  . Lipid panel    Standing Status:   Future    Standing Expiration Date:   07/25/2020  . Basic metabolic panel (future)    Standing Status:   Future    Standing Expiration Date:   07/25/2020     I discussed the assessment and treatment plan with the patient. The patient was provided an opportunity to ask questions and all were answered. The patient agreed with the plan and demonstrated an understanding of the instructions.   The patient was advised to call back or seek an in-person evaluation if the symptoms worsen or if the condition fails to improve as anticipated.   Einar Pheasant, MD

## 2019-07-26 NOTE — Assessment & Plan Note (Signed)
Seeing Dr Amalia Hailey.  No increased pain.  Follow.

## 2019-07-26 NOTE — Assessment & Plan Note (Signed)
Blood pressure under good control.  Continue same medication regimen.  Follow pressures.  Follow metabolic panel.   

## 2019-07-26 NOTE — Assessment & Plan Note (Signed)
Taking one whole tablet now.  Stable.  Follow.  Continue current dose.

## 2019-07-26 NOTE — Assessment & Plan Note (Signed)
On metformin.  Tolerating.  Diarrhea better.  Recent a1c improved.  Follow met b and a1c.

## 2019-07-26 NOTE — Assessment & Plan Note (Signed)
CBC just checked - platelet count wnl.

## 2019-07-26 NOTE — Assessment & Plan Note (Signed)
Maintained on plavix.  Doing well.  Follow.

## 2019-07-26 NOTE — Assessment & Plan Note (Signed)
a1c improved.  Tolerating metformin.  Follow met b and a1c.

## 2019-07-26 NOTE — Assessment & Plan Note (Signed)
Follow lipid panel.   

## 2019-07-26 NOTE — Telephone Encounter (Signed)
Pt daughter called to Ask if Dr. Nicki Reaper wanted her mother to get the COVID vaccine she forgot to ask at her appt

## 2019-07-27 NOTE — Telephone Encounter (Signed)
Please let her know where the vaccine is available (at this time).  Also, let her know that contraindication - if allergic to any ingredients in the covid vaccine and also caution if has had allergic reaction to other vaccines.

## 2019-07-28 NOTE — Telephone Encounter (Signed)
Spoke with daughter. Advised of below.

## 2019-08-02 DIAGNOSIS — Z7984 Long term (current) use of oral hypoglycemic drugs: Secondary | ICD-10-CM | POA: Diagnosis not present

## 2019-08-02 DIAGNOSIS — I10 Essential (primary) hypertension: Secondary | ICD-10-CM | POA: Diagnosis not present

## 2019-08-02 DIAGNOSIS — E1140 Type 2 diabetes mellitus with diabetic neuropathy, unspecified: Secondary | ICD-10-CM | POA: Diagnosis not present

## 2019-08-02 DIAGNOSIS — E11621 Type 2 diabetes mellitus with foot ulcer: Secondary | ICD-10-CM | POA: Diagnosis not present

## 2019-08-02 DIAGNOSIS — F419 Anxiety disorder, unspecified: Secondary | ICD-10-CM | POA: Diagnosis not present

## 2019-08-02 DIAGNOSIS — L97422 Non-pressure chronic ulcer of left heel and midfoot with fat layer exposed: Secondary | ICD-10-CM | POA: Diagnosis not present

## 2019-08-08 ENCOUNTER — Ambulatory Visit (INDEPENDENT_AMBULATORY_CARE_PROVIDER_SITE_OTHER): Payer: Medicare Other | Admitting: Podiatry

## 2019-08-08 ENCOUNTER — Other Ambulatory Visit: Payer: Self-pay

## 2019-08-08 ENCOUNTER — Encounter: Payer: Self-pay | Admitting: Podiatry

## 2019-08-08 ENCOUNTER — Telehealth: Payer: Self-pay | Admitting: Internal Medicine

## 2019-08-08 DIAGNOSIS — L97522 Non-pressure chronic ulcer of other part of left foot with fat layer exposed: Secondary | ICD-10-CM

## 2019-08-08 DIAGNOSIS — E0843 Diabetes mellitus due to underlying condition with diabetic autonomic (poly)neuropathy: Secondary | ICD-10-CM

## 2019-08-08 MED ORDER — SERTRALINE HCL 25 MG PO TABS: 25.0000 mg | ORAL_TABLET | Freq: Every day | ORAL | 0 refills | Status: DC

## 2019-08-08 NOTE — Telephone Encounter (Signed)
Pt's daughter states that pt needs a refill on Zoloft but [facility] will not have it until the 27th. Pt needs a few pills from [facility] in [place] until then.

## 2019-08-09 NOTE — Telephone Encounter (Signed)
Short supply sent in.

## 2019-08-10 DIAGNOSIS — L97422 Non-pressure chronic ulcer of left heel and midfoot with fat layer exposed: Secondary | ICD-10-CM | POA: Diagnosis not present

## 2019-08-10 DIAGNOSIS — Z7984 Long term (current) use of oral hypoglycemic drugs: Secondary | ICD-10-CM | POA: Diagnosis not present

## 2019-08-10 DIAGNOSIS — E11621 Type 2 diabetes mellitus with foot ulcer: Secondary | ICD-10-CM | POA: Diagnosis not present

## 2019-08-10 DIAGNOSIS — F419 Anxiety disorder, unspecified: Secondary | ICD-10-CM | POA: Diagnosis not present

## 2019-08-10 DIAGNOSIS — E1140 Type 2 diabetes mellitus with diabetic neuropathy, unspecified: Secondary | ICD-10-CM | POA: Diagnosis not present

## 2019-08-10 DIAGNOSIS — I10 Essential (primary) hypertension: Secondary | ICD-10-CM | POA: Diagnosis not present

## 2019-08-10 NOTE — Progress Notes (Signed)
   Subjective:  86 year old female with PMHx of DM presenting today for follow up evaluation of an ulceration of the left foot. She states the wound has worsened. She reports associated bleeding and malodor that began 1-2 days ago. She has been using Aquacel and changing her dressing as directed. There are no modifying factors noted. Patient is here for further evaluation and treatment.   Past Medical History:  Diagnosis Date  . Allergy   . Chicken pox   . CVA (cerebral vascular accident) ([place])   . Diabetes mellitus ([place])   . Hypercholesterolemia   . Hypertension       Objective/Physical Exam General: The patient is alert and oriented x3 in no acute distress.  Dermatology:  Wound #1 noted to the sub-first MPJ of the left foot measuring 3.5 x 2.0 x 0.3 cm (LxWxD).     To the noted ulceration(s), there is no eschar. There is a moderate amount of slough, fibrin, and necrotic tissue noted. Granulation tissue and wound base is red. There is a minimal amount of serosanguineous drainage noted. There is no exposed bone muscle-tendon ligament or joint. There is no malodor. Periwound integrity is intact. Skin is warm, dry and supple bilateral lower extremities.   Vascular: Palpable pedal pulses bilaterally. No edema or erythema noted. Capillary refill within normal limits.  Neurological: Epicritic and protective threshold diminished bilaterally.   Musculoskeletal Exam: Range of motion within normal limits to all pedal and ankle joints bilateral. Muscle strength 5/5 in all groups bilateral.   Assessment: #1 ulceration of the sub-first MPJ of the left foot secondary to diabetes mellitus #2 diabetes mellitus w/ peripheral neuropathy  Plan of Care:  #1 Patient was evaluated. #2 Medically necessary excisional debridement including subcutaneous tissue was performed using a tissue nipper and a chisel blade. Excisional debridement of all the necrotic nonviable tissue down to healthy bleeding  viable tissue was performed with post-debridement measurements same as pre-. #3 The wound was cleansed and dry sterile dressing applied. #4 post op shoe dispensed.  #5 Continue dressing changes every other day.  #6 Return to clinic in 3 weeks.     [name], DPM [facility]  Dr. [name], [address]                                        [place], [place] [address]                Office [phone]  Fax [phone]

## 2019-08-14 ENCOUNTER — Ambulatory Visit (INDEPENDENT_AMBULATORY_CARE_PROVIDER_SITE_OTHER): Payer: Medicare Other | Admitting: Pharmacist

## 2019-08-14 DIAGNOSIS — F419 Anxiety disorder, unspecified: Secondary | ICD-10-CM | POA: Diagnosis not present

## 2019-08-14 DIAGNOSIS — E1140 Type 2 diabetes mellitus with diabetic neuropathy, unspecified: Secondary | ICD-10-CM

## 2019-08-14 DIAGNOSIS — Z8673 Personal history of transient ischemic attack (TIA), and cerebral infarction without residual deficits: Secondary | ICD-10-CM | POA: Diagnosis not present

## 2019-08-14 DIAGNOSIS — E7800 Pure hypercholesterolemia, unspecified: Secondary | ICD-10-CM | POA: Diagnosis not present

## 2019-08-14 DIAGNOSIS — E11621 Type 2 diabetes mellitus with foot ulcer: Secondary | ICD-10-CM | POA: Diagnosis not present

## 2019-08-14 DIAGNOSIS — L97422 Non-pressure chronic ulcer of left heel and midfoot with fat layer exposed: Secondary | ICD-10-CM | POA: Diagnosis not present

## 2019-08-14 DIAGNOSIS — Z7984 Long term (current) use of oral hypoglycemic drugs: Secondary | ICD-10-CM | POA: Diagnosis not present

## 2019-08-14 DIAGNOSIS — I10 Essential (primary) hypertension: Secondary | ICD-10-CM | POA: Diagnosis not present

## 2019-08-14 NOTE — Progress Notes (Signed)
Reviewed information.  I am ok if zoloft is placed on automatic refill.  Will have to d/w ophthalmology regarding her eye drops.  Agree with plan.   Dr Nicki Reaper

## 2019-08-14 NOTE — Patient Instructions (Signed)
Visit Information  Goals Addressed            This Visit's Progress     Patient Stated   . "We want her to stay healthy" (pt-stated)       Current Barriers:  . Diabetes: controlled at relaxed goal of [id number]; complicated by diabetic ulcer; most recent A1c 7.1%  o Daughter, [name], reports that they are quite pleased with current sugar control and lack of side effects.  . Current antihyperglycemic regimen: metformin XR 1000 mg QAM, 500 mg QPM . Denies hypoglycemia . Current blood sugar readings:  o Fasting: yesterday 151 (generally 130-180s) o 2 hour post prandial 242 was highest she has seen; generally in low 200s (170-200s) . Cardiovascular risk reduction: o Current hypertensive regimen: amlodipine 5 mg BID o Current hyperlipidemia regimen: omega 3 fatty acids;  o Antiplatelet regimen: clopidogrel 75 mg daily  . Depression: sertraline 25 mg daily; [name] reports that this medication is not on automatic refill w/ [facility], so she accidentally almost ran out. Tried to request to be placed on automatic refill, but whomever she had previously spoken with noted that it could not be placed on automatic refill . Glaucoma: timolol eye drops; [name] also wonders if this could be placed on automatic refill.   Pharmacist Clinical Goal(s):  Marland Kitchen Over the next 90 days, patient will work with PharmD and primary care provider to address optimized glycemic regimen  Interventions: . Comprehensive medication review performed, medication list updated in electronic medical record . Praised controlled A1c w/ current metformin monotherapy. They will continue to monitor sugars to ensure maintenance of relaxed control. As previously noted, could consider addition of DPP4 moving forward if needed.  . Contacted [facility]. Requested sertraline and timolol be put on automatic refill. The technician I spoke with did not mention any issue with sertraline being on automatic refill, and completed my request. I informed  [name] of this, but to let me know if there are any issues in the future with this medication being filled automatically  Patient Self Care Activities:  . Patient will check blood glucose BID , document, and provide at future appointments . Patient will take medications as prescribed . Patient will report any questions or concerns to provider   Please see past updates related to this goal by clicking on the "Past Updates" button in the selected goal           The patient verbalized understanding of instructions provided today and declined a print copy of patient instruction materials.   Plan:  - Scheduled f/u call 09/25/19  Catie Darnelle Maffucci, PharmD, BCACP, CPP Clinical Pharmacist [place] [phone]

## 2019-08-14 NOTE — Chronic Care Management (AMB) (Signed)
Chronic Care Management   Follow Up Note   08/14/2019 Name: Angela Patterson MRN: [id number] DOB: 1920/11/09  Referred by: Einar Pheasant, MD Reason for referral : Chronic Care Management (Medication Management)   Angela Patterson is a 86 y.o. year old female who is a primary care patient of Einar Pheasant, MD. The CCM team was consulted for assistance with chronic disease management and care coordination needs.    Contacted patient's daughter, Angela Patterson, for medication management review.   Review of patient status, including review of consultants reports, relevant laboratory and other test results, and collaboration with appropriate care team members and the patient's provider was performed as part of comprehensive patient evaluation and provision of chronic care management services.    SDOH (Social Determinants of Health) screening performed today: None. See Care Plan for related entries.   Outpatient Encounter Medications as of 08/14/2019  Medication Sig Note  . amLODipine (NORVASC) 5 MG tablet TAKE 1 TABLET BY MOUTH  TWICE DAILY   . Cholecalciferol (VITAMIN D) 2000 units tablet Take 2,000 Units by mouth daily.    . clopidogrel (PLAVIX) 75 MG tablet TAKE 1 TABLET BY MOUTH  DAILY   . glucose blood test strip USE TO CHECK BLOOD SUGAR  TWO TIMES DAILY   . hydrocortisone (PROCTOCORT) 1 % CREA Apply thin layer around rectal opening 2 times per day to help hemorrhoids   . Lancet Devices (ONE TOUCH DELICA LANCING DEV) MISC Use twice daily Dx: 250.00   . metFORMIN (GLUCOPHAGE-XR) 500 MG 24 hr tablet Take 2 tablets (1,000 mg total) by mouth 2 (two) times daily. 06/26/2019: 2 QAM, 1 QPM  . OMEGA-3 FATTY ACIDS PO Take 720 mg by mouth daily.   . Probiotic Product (PROBIOTIC DAILY PO) Take 1 tablet by mouth daily.   . sertraline (ZOLOFT) 25 MG tablet Take 1 tablet (25 mg total) by mouth daily.   . timolol (BETIMOL) 0.5 % ophthalmic solution 1 drop 2 (two) times daily.   . [DISCONTINUED] Multiple Vitamin  (MULTIVITAMIN) tablet Take 1 tablet by mouth daily.   . [DISCONTINUED] Omega-3 1000 MG CAPS Take by mouth.    No facility-administered encounter medications on file as of 08/14/2019.     Objective:   Goals Addressed            This Visit's Progress     Patient Stated   . "We want her to stay healthy" (pt-stated)       Current Barriers:  . Diabetes: controlled at relaxed goal of [id number]; complicated by diabetic ulcer; most recent A1c 7.1%  o Daughter, Angela Patterson, reports that they are quite pleased with current sugar control and lack of side effects.  . Current antihyperglycemic regimen: metformin XR 1000 mg QAM, 500 mg QPM . Denies hypoglycemia . Current blood sugar readings:  o Fasting: yesterday 151 (generally 130-180s) o 2 hour post prandial 242 was highest she has seen; generally in low 200s (170-200s) . Cardiovascular risk reduction: o Current hypertensive regimen: amlodipine 5 mg BID o Current hyperlipidemia regimen: omega 3 fatty acids;  o Antiplatelet regimen: clopidogrel 75 mg daily  . Depression: sertraline 25 mg daily; Angela Patterson reports that this medication is not on automatic refill w/ [facility], so she accidentally almost ran out. Tried to request to be placed on automatic refill, but whomever she had previously spoken with noted that it could not be placed on automatic refill . Glaucoma: timolol eye drops; Angela Patterson also wonders if this could be placed on automatic refill.  Pharmacist Clinical Goal(s):  Marland Kitchen Over the next 90 days, patient will work with PharmD and primary care provider to address optimized glycemic regimen  Interventions: . Comprehensive medication review performed, medication list updated in electronic medical record . Praised controlled A1c w/ current metformin monotherapy. They will continue to monitor sugars to ensure maintenance of relaxed control. As previously noted, could consider addition of DPP4 moving forward if needed.  . Contacted [facility]. Requested  sertraline and timolol be put on automatic refill. The technician I spoke with did not mention any issue with sertraline being on automatic refill, and completed my request. I informed Angela Patterson of this, but to let me know if there are any issues in the future with this medication being filled automatically  Patient Self Care Activities:  . Patient will check blood glucose BID , document, and provide at future appointments . Patient will take medications as prescribed . Patient will report any questions or concerns to provider   Please see past updates related to this goal by clicking on the "Past Updates" button in the selected goal            Plan:  - Scheduled f/u call 09/25/19  Catie Darnelle Maffucci, PharmD, BCACP, [place] Pharmacist [place] [phone]

## 2019-08-17 DIAGNOSIS — E11621 Type 2 diabetes mellitus with foot ulcer: Secondary | ICD-10-CM | POA: Diagnosis not present

## 2019-08-17 DIAGNOSIS — F419 Anxiety disorder, unspecified: Secondary | ICD-10-CM | POA: Diagnosis not present

## 2019-08-17 DIAGNOSIS — L97422 Non-pressure chronic ulcer of left heel and midfoot with fat layer exposed: Secondary | ICD-10-CM | POA: Diagnosis not present

## 2019-08-17 DIAGNOSIS — Z7984 Long term (current) use of oral hypoglycemic drugs: Secondary | ICD-10-CM | POA: Diagnosis not present

## 2019-08-17 DIAGNOSIS — I10 Essential (primary) hypertension: Secondary | ICD-10-CM | POA: Diagnosis not present

## 2019-08-17 DIAGNOSIS — E1140 Type 2 diabetes mellitus with diabetic neuropathy, unspecified: Secondary | ICD-10-CM | POA: Diagnosis not present

## 2019-08-24 DIAGNOSIS — F419 Anxiety disorder, unspecified: Secondary | ICD-10-CM | POA: Diagnosis not present

## 2019-08-24 DIAGNOSIS — L97422 Non-pressure chronic ulcer of left heel and midfoot with fat layer exposed: Secondary | ICD-10-CM | POA: Diagnosis not present

## 2019-08-24 DIAGNOSIS — I10 Essential (primary) hypertension: Secondary | ICD-10-CM | POA: Diagnosis not present

## 2019-08-24 DIAGNOSIS — Z7984 Long term (current) use of oral hypoglycemic drugs: Secondary | ICD-10-CM | POA: Diagnosis not present

## 2019-08-24 DIAGNOSIS — E11621 Type 2 diabetes mellitus with foot ulcer: Secondary | ICD-10-CM | POA: Diagnosis not present

## 2019-08-24 DIAGNOSIS — E1140 Type 2 diabetes mellitus with diabetic neuropathy, unspecified: Secondary | ICD-10-CM | POA: Diagnosis not present

## 2019-08-29 ENCOUNTER — Encounter: Payer: Self-pay | Admitting: Podiatry

## 2019-08-29 ENCOUNTER — Ambulatory Visit (INDEPENDENT_AMBULATORY_CARE_PROVIDER_SITE_OTHER): Payer: Medicare Other | Admitting: Podiatry

## 2019-08-29 ENCOUNTER — Other Ambulatory Visit: Payer: Self-pay

## 2019-08-29 DIAGNOSIS — L97522 Non-pressure chronic ulcer of other part of left foot with fat layer exposed: Secondary | ICD-10-CM | POA: Diagnosis not present

## 2019-08-29 DIAGNOSIS — E0843 Diabetes mellitus due to underlying condition with diabetic autonomic (poly)neuropathy: Secondary | ICD-10-CM

## 2019-08-29 DIAGNOSIS — B351 Tinea unguium: Secondary | ICD-10-CM | POA: Diagnosis not present

## 2019-08-29 DIAGNOSIS — M79676 Pain in unspecified toe(s): Secondary | ICD-10-CM

## 2019-08-30 NOTE — Progress Notes (Signed)
   Subjective:  86 year old female with PMHx of DM presenting today for follow up evaluation of an ulceration of the left foot. She states her wound is about the same as it was previously. She reports some drainage from the area. She has been changing her dressing as directed.  She also complains of elongated, thickened nails that cause pain while ambulating in shoes. She is unable to trim her own nails. Patient is here for further evaluation and treatment.   Past Medical History:  Diagnosis Date  . Allergy   . Chicken pox   . CVA (cerebral vascular accident) ([place])   . Diabetes mellitus ([place])   . Hypercholesterolemia   . Hypertension       Objective/Physical Exam General: The patient is alert and oriented x3 in no acute distress.  Dermatology:  Wound #1 noted to the sub-first MPJ of the left foot measuring 1.5 x 0.7 x 0.2 cm (LxWxD).     To the noted ulceration(s), there is no eschar. There is a moderate amount of slough, fibrin, and necrotic tissue noted. Granulation tissue and wound base is red. There is a minimal amount of serosanguineous drainage noted. There is no exposed bone muscle-tendon ligament or joint. There is no malodor. Periwound integrity is intact. Skin is warm, dry and supple bilateral lower extremities.  Nails are tender, long, thickened and dystrophic with subungual debris, consistent with onychomycosis, 1-5 bilateral. No signs of infection noted.  Vascular: Palpable pedal pulses bilaterally. No edema or erythema noted. Capillary refill within normal limits.  Neurological: Epicritic and protective threshold diminished bilaterally.   Musculoskeletal Exam: Range of motion within normal limits to all pedal and ankle joints bilateral. Muscle strength 5/5 in all groups bilateral.   Assessment: #1 ulceration of the sub-first MPJ of the left foot secondary to diabetes mellitus #2 Onychomycosis of nail due to dermatophyte bilateral #3 diabetes mellitus w/ peripheral  neuropathy  Plan of Care:  #1 Patient was evaluated. #2 Medically necessary excisional debridement including subcutaneous tissue was performed using a tissue nipper and a chisel blade. Excisional debridement of all the necrotic nonviable tissue down to healthy bleeding viable tissue was performed with post-debridement measurements same as pre-. #3 The wound was cleansed and dry sterile dressing applied. #4 Continue silvercell dressings.  #5 Appointment with Liliane Channel, Pedorthist, for DM shoes. #6 Return to clinic in 4 weeks.     [name], DPM [facility]  Dr. [name], [address]                                        [place], [place] [address]                Office [phone]  Fax [phone]

## 2019-09-01 DIAGNOSIS — F419 Anxiety disorder, unspecified: Secondary | ICD-10-CM | POA: Diagnosis not present

## 2019-09-01 DIAGNOSIS — I10 Essential (primary) hypertension: Secondary | ICD-10-CM | POA: Diagnosis not present

## 2019-09-01 DIAGNOSIS — E1140 Type 2 diabetes mellitus with diabetic neuropathy, unspecified: Secondary | ICD-10-CM | POA: Diagnosis not present

## 2019-09-01 DIAGNOSIS — E11621 Type 2 diabetes mellitus with foot ulcer: Secondary | ICD-10-CM | POA: Diagnosis not present

## 2019-09-01 DIAGNOSIS — L97422 Non-pressure chronic ulcer of left heel and midfoot with fat layer exposed: Secondary | ICD-10-CM | POA: Diagnosis not present

## 2019-09-01 DIAGNOSIS — Z7984 Long term (current) use of oral hypoglycemic drugs: Secondary | ICD-10-CM | POA: Diagnosis not present

## 2019-09-08 DIAGNOSIS — F419 Anxiety disorder, unspecified: Secondary | ICD-10-CM | POA: Diagnosis not present

## 2019-09-08 DIAGNOSIS — I10 Essential (primary) hypertension: Secondary | ICD-10-CM | POA: Diagnosis not present

## 2019-09-08 DIAGNOSIS — E1140 Type 2 diabetes mellitus with diabetic neuropathy, unspecified: Secondary | ICD-10-CM | POA: Diagnosis not present

## 2019-09-08 DIAGNOSIS — L97422 Non-pressure chronic ulcer of left heel and midfoot with fat layer exposed: Secondary | ICD-10-CM | POA: Diagnosis not present

## 2019-09-08 DIAGNOSIS — E11621 Type 2 diabetes mellitus with foot ulcer: Secondary | ICD-10-CM | POA: Diagnosis not present

## 2019-09-08 DIAGNOSIS — Z7984 Long term (current) use of oral hypoglycemic drugs: Secondary | ICD-10-CM | POA: Diagnosis not present

## 2019-09-12 ENCOUNTER — Other Ambulatory Visit: Payer: Self-pay

## 2019-09-12 ENCOUNTER — Ambulatory Visit: Payer: Medicare Other | Admitting: Orthotics

## 2019-09-12 DIAGNOSIS — E0843 Diabetes mellitus due to underlying condition with diabetic autonomic (poly)neuropathy: Secondary | ICD-10-CM

## 2019-09-12 NOTE — Progress Notes (Signed)

## 2019-09-13 ENCOUNTER — Telehealth: Payer: Self-pay | Admitting: Internal Medicine

## 2019-09-13 DIAGNOSIS — Z7984 Long term (current) use of oral hypoglycemic drugs: Secondary | ICD-10-CM | POA: Diagnosis not present

## 2019-09-13 DIAGNOSIS — I10 Essential (primary) hypertension: Secondary | ICD-10-CM | POA: Diagnosis not present

## 2019-09-13 DIAGNOSIS — Z8673 Personal history of transient ischemic attack (TIA), and cerebral infarction without residual deficits: Secondary | ICD-10-CM | POA: Diagnosis not present

## 2019-09-13 DIAGNOSIS — F419 Anxiety disorder, unspecified: Secondary | ICD-10-CM | POA: Diagnosis not present

## 2019-09-13 DIAGNOSIS — E11621 Type 2 diabetes mellitus with foot ulcer: Secondary | ICD-10-CM | POA: Diagnosis not present

## 2019-09-13 DIAGNOSIS — E1140 Type 2 diabetes mellitus with diabetic neuropathy, unspecified: Secondary | ICD-10-CM | POA: Diagnosis not present

## 2019-09-13 DIAGNOSIS — L97422 Non-pressure chronic ulcer of left heel and midfoot with fat layer exposed: Secondary | ICD-10-CM | POA: Diagnosis not present

## 2019-09-13 NOTE — Telephone Encounter (Signed)
Lynse from [facility], [phone]. Daughter is reporting that patient is having increase in difficulty swallowing, weakness in legs have increased. Would like to start physical therapy, speech therapy and OT. Order needed.

## 2019-09-13 NOTE — Telephone Encounter (Signed)
Verbals given  

## 2019-09-14 ENCOUNTER — Other Ambulatory Visit: Payer: Self-pay | Admitting: *Deleted

## 2019-09-14 DIAGNOSIS — E11621 Type 2 diabetes mellitus with foot ulcer: Secondary | ICD-10-CM | POA: Diagnosis not present

## 2019-09-14 DIAGNOSIS — F419 Anxiety disorder, unspecified: Secondary | ICD-10-CM | POA: Diagnosis not present

## 2019-09-14 DIAGNOSIS — L97422 Non-pressure chronic ulcer of left heel and midfoot with fat layer exposed: Secondary | ICD-10-CM | POA: Diagnosis not present

## 2019-09-14 DIAGNOSIS — E1140 Type 2 diabetes mellitus with diabetic neuropathy, unspecified: Secondary | ICD-10-CM | POA: Diagnosis not present

## 2019-09-14 DIAGNOSIS — I10 Essential (primary) hypertension: Secondary | ICD-10-CM | POA: Diagnosis not present

## 2019-09-14 DIAGNOSIS — Z7984 Long term (current) use of oral hypoglycemic drugs: Secondary | ICD-10-CM | POA: Diagnosis not present

## 2019-09-16 DIAGNOSIS — Z7984 Long term (current) use of oral hypoglycemic drugs: Secondary | ICD-10-CM | POA: Diagnosis not present

## 2019-09-16 DIAGNOSIS — E1140 Type 2 diabetes mellitus with diabetic neuropathy, unspecified: Secondary | ICD-10-CM | POA: Diagnosis not present

## 2019-09-16 DIAGNOSIS — E11621 Type 2 diabetes mellitus with foot ulcer: Secondary | ICD-10-CM | POA: Diagnosis not present

## 2019-09-16 DIAGNOSIS — F419 Anxiety disorder, unspecified: Secondary | ICD-10-CM | POA: Diagnosis not present

## 2019-09-16 DIAGNOSIS — L97422 Non-pressure chronic ulcer of left heel and midfoot with fat layer exposed: Secondary | ICD-10-CM | POA: Diagnosis not present

## 2019-09-16 DIAGNOSIS — I10 Essential (primary) hypertension: Secondary | ICD-10-CM | POA: Diagnosis not present

## 2019-09-18 DIAGNOSIS — E11621 Type 2 diabetes mellitus with foot ulcer: Secondary | ICD-10-CM | POA: Diagnosis not present

## 2019-09-18 DIAGNOSIS — F419 Anxiety disorder, unspecified: Secondary | ICD-10-CM | POA: Diagnosis not present

## 2019-09-18 DIAGNOSIS — E1140 Type 2 diabetes mellitus with diabetic neuropathy, unspecified: Secondary | ICD-10-CM | POA: Diagnosis not present

## 2019-09-18 DIAGNOSIS — Z7984 Long term (current) use of oral hypoglycemic drugs: Secondary | ICD-10-CM | POA: Diagnosis not present

## 2019-09-18 DIAGNOSIS — L97422 Non-pressure chronic ulcer of left heel and midfoot with fat layer exposed: Secondary | ICD-10-CM | POA: Diagnosis not present

## 2019-09-18 DIAGNOSIS — I10 Essential (primary) hypertension: Secondary | ICD-10-CM | POA: Diagnosis not present

## 2019-09-19 ENCOUNTER — Other Ambulatory Visit: Payer: Self-pay

## 2019-09-19 ENCOUNTER — Ambulatory Visit (INDEPENDENT_AMBULATORY_CARE_PROVIDER_SITE_OTHER): Payer: Medicare Other | Admitting: Podiatry

## 2019-09-19 DIAGNOSIS — L97522 Non-pressure chronic ulcer of other part of left foot with fat layer exposed: Secondary | ICD-10-CM

## 2019-09-19 DIAGNOSIS — E0843 Diabetes mellitus due to underlying condition with diabetic autonomic (poly)neuropathy: Secondary | ICD-10-CM

## 2019-09-20 DIAGNOSIS — E1140 Type 2 diabetes mellitus with diabetic neuropathy, unspecified: Secondary | ICD-10-CM | POA: Diagnosis not present

## 2019-09-20 DIAGNOSIS — Z7984 Long term (current) use of oral hypoglycemic drugs: Secondary | ICD-10-CM | POA: Diagnosis not present

## 2019-09-20 DIAGNOSIS — L97422 Non-pressure chronic ulcer of left heel and midfoot with fat layer exposed: Secondary | ICD-10-CM | POA: Diagnosis not present

## 2019-09-20 DIAGNOSIS — E11621 Type 2 diabetes mellitus with foot ulcer: Secondary | ICD-10-CM | POA: Diagnosis not present

## 2019-09-20 DIAGNOSIS — F419 Anxiety disorder, unspecified: Secondary | ICD-10-CM | POA: Diagnosis not present

## 2019-09-20 DIAGNOSIS — I10 Essential (primary) hypertension: Secondary | ICD-10-CM | POA: Diagnosis not present

## 2019-09-21 DIAGNOSIS — I10 Essential (primary) hypertension: Secondary | ICD-10-CM | POA: Diagnosis not present

## 2019-09-21 DIAGNOSIS — Z7984 Long term (current) use of oral hypoglycemic drugs: Secondary | ICD-10-CM | POA: Diagnosis not present

## 2019-09-21 DIAGNOSIS — E1140 Type 2 diabetes mellitus with diabetic neuropathy, unspecified: Secondary | ICD-10-CM | POA: Diagnosis not present

## 2019-09-21 DIAGNOSIS — E11621 Type 2 diabetes mellitus with foot ulcer: Secondary | ICD-10-CM | POA: Diagnosis not present

## 2019-09-21 DIAGNOSIS — L97422 Non-pressure chronic ulcer of left heel and midfoot with fat layer exposed: Secondary | ICD-10-CM | POA: Diagnosis not present

## 2019-09-21 DIAGNOSIS — F419 Anxiety disorder, unspecified: Secondary | ICD-10-CM | POA: Diagnosis not present

## 2019-09-21 NOTE — Progress Notes (Signed)
   Subjective:  86 year old female with PMHx of DM presenting today with her granddaughter for follow up evaluation of an ulceration of the left foot. They both express concern about the ulceration not improving. She has been using Aquacel Ag daily as directed. She denies any modifying factors. Patient is here for further evaluation and treatment.   Past Medical History:  Diagnosis Date  . Allergy   . Chicken pox   . CVA (cerebral vascular accident) ([place])   . Diabetes mellitus ([place])   . Hypercholesterolemia   . Hypertension       Objective/Physical Exam General: The patient is alert and oriented x3 in no acute distress.  Dermatology:  Wound #1 noted to the sub-first MPJ of the left foot measuring 1.5 x 0.7 x 0.2 cm (LxWxD).     To the noted ulceration(s), there is no eschar. There is a moderate amount of slough, fibrin, and necrotic tissue noted. Granulation tissue and wound base is red. There is a minimal amount of serosanguineous drainage noted. There is no exposed bone muscle-tendon ligament or joint. There is no malodor. Periwound integrity is intact. Skin is warm, dry and supple bilateral lower extremities.   Vascular: Palpable pedal pulses bilaterally. No edema or erythema noted. Capillary refill within normal limits.  Neurological: Epicritic and protective threshold diminished bilaterally.   Musculoskeletal Exam: Range of motion within normal limits to all pedal and ankle joints bilateral. Muscle strength 5/5 in all groups bilateral.   Assessment: #1 ulceration of the sub-first MPJ of the left foot secondary to diabetes mellitus #2 diabetes mellitus w/ peripheral neuropathy  Plan of Care:  #1 Patient was evaluated. #2 Medically necessary excisional debridement including subcutaneous tissue was performed using a tissue nipper and a chisel blade. Excisional debridement of all the necrotic nonviable tissue down to healthy bleeding viable tissue was performed with  post-debridement measurements same as pre-. #3 The wound was cleansed and dry sterile dressing applied. #4 Continue using Aquacel Ag at home daily.  #5 New DM shoes pending.  #6 Return to clinic in 3 weeks.     [name], DPM [facility]  Dr. [name], [address]                                        [place], [place] [address]                Office [phone]  Fax [phone]

## 2019-09-22 DIAGNOSIS — F419 Anxiety disorder, unspecified: Secondary | ICD-10-CM | POA: Diagnosis not present

## 2019-09-22 DIAGNOSIS — I10 Essential (primary) hypertension: Secondary | ICD-10-CM | POA: Diagnosis not present

## 2019-09-22 DIAGNOSIS — L97422 Non-pressure chronic ulcer of left heel and midfoot with fat layer exposed: Secondary | ICD-10-CM | POA: Diagnosis not present

## 2019-09-22 DIAGNOSIS — E11621 Type 2 diabetes mellitus with foot ulcer: Secondary | ICD-10-CM | POA: Diagnosis not present

## 2019-09-22 DIAGNOSIS — Z7984 Long term (current) use of oral hypoglycemic drugs: Secondary | ICD-10-CM | POA: Diagnosis not present

## 2019-09-22 DIAGNOSIS — E1140 Type 2 diabetes mellitus with diabetic neuropathy, unspecified: Secondary | ICD-10-CM | POA: Diagnosis not present

## 2019-09-25 ENCOUNTER — Telehealth: Payer: Self-pay | Admitting: Internal Medicine

## 2019-09-25 ENCOUNTER — Ambulatory Visit (INDEPENDENT_AMBULATORY_CARE_PROVIDER_SITE_OTHER): Payer: Medicare Other | Admitting: Pharmacist

## 2019-09-25 DIAGNOSIS — E1140 Type 2 diabetes mellitus with diabetic neuropathy, unspecified: Secondary | ICD-10-CM | POA: Diagnosis not present

## 2019-09-25 DIAGNOSIS — F419 Anxiety disorder, unspecified: Secondary | ICD-10-CM

## 2019-09-25 NOTE — Chronic Care Management (AMB) (Signed)
Chronic Care Management   Follow Up Note   09/25/2019 Name: Angela Patterson MRN: [id number] DOB: 31-May-1921  Referred by: Einar Pheasant, MD Reason for referral : Chronic Care Management (Medication Management)   Angela Patterson is a 86 y.o. year old female who is a primary care patient of Einar Pheasant, MD. The CCM team was consulted for assistance with chronic disease management and care coordination needs.    Contacted patient's daughter, Angela Patterson, for medication management review today.   Review of patient status, including review of consultants reports, relevant laboratory and other test results, and collaboration with appropriate care team members and the patient's provider was performed as part of comprehensive patient evaluation and provision of chronic care management services.    SDOH (Social Determinants of Health) assessments performed: No See Care Plan activities for detailed interventions related to [facility])     Outpatient Encounter Medications as of 09/25/2019  Medication Sig Note  . amLODipine (NORVASC) 5 MG tablet TAKE 1 TABLET BY MOUTH  TWICE DAILY   . Cholecalciferol (VITAMIN D) 2000 units tablet Take 2,000 Units by mouth daily.    . clopidogrel (PLAVIX) 75 MG tablet TAKE 1 TABLET BY MOUTH  DAILY   . glucose blood test strip USE TO CHECK BLOOD SUGAR  TWO TIMES DAILY   . hydrocortisone (PROCTOCORT) 1 % CREA Apply thin layer around rectal opening 2 times per day to help hemorrhoids   . Lancet Devices (ONE TOUCH DELICA LANCING DEV) MISC Use twice daily Dx: 250.00   . metFORMIN (GLUCOPHAGE-XR) 500 MG 24 hr tablet Take 2 tablets (1,000 mg total) by mouth 2 (two) times daily. 06/26/2019: 2 QAM, 1 QPM  . OMEGA-3 FATTY ACIDS PO Take 720 mg by mouth daily.   . sertraline (ZOLOFT) 25 MG tablet Take 1 tablet (25 mg total) by mouth daily.   . timolol (BETIMOL) 0.5 % ophthalmic solution 1 drop 2 (two) times daily.   . Probiotic Product (PROBIOTIC DAILY PO) Take 1 tablet by mouth daily.      No facility-administered encounter medications on file as of 09/25/2019.     Objective:   Goals Addressed            This Visit's Progress     Patient Stated   . "We want her to stay healthy" (pt-stated)       CARE PLAN ENTRY (see longtitudinal plan of care for additional care plan information)  Current Barriers:  . Diabetes: controlled at relaxed goal of [id number]; complicated by diabetic ulcer; most recent A1c 7.1%  o Angela Patterson reports one recent episode: had 2nd COVID shot on a Thursday, that Saturday (09/02/19), she got dizzy and fell in the bathroom and hit her head. Had some throwing up later that day. Teresa's daughter in law is a [employer] scientist, and they discussed seeking emergency care to see if concussed, but decided against it. Patient has had no problems since then.  Domenick Gong asked about reducing frequency of blood sugar checks to reduce stress on her mother o OT, PT, speech therapy have been in the home; notes OT has noted patient has been extremely anxious; they are working with her on deep breathing, relaxation techniques; Angela Patterson wondering if sertraline needs to be upped. Notes this is how her mother has always been. Notes that her mother can't be as active as she used to be, so doesn't have an "outlet" for her energy/anxiety . Current antihyperglycemic regimen: metformin XR 1000 mg QAM, 500 mg QPM . Denies hypoglycemia .  Current blood sugar readings:  o Fasting: 130-170s o 2 hour post prandial: 190-230s  . Cardiovascular risk reduction: o Current hypertensive regimen: amlodipine 5 mg BID o Current hyperlipidemia regimen: omega 3 fatty acids;  o Antiplatelet regimen: clopidogrel 75 mg daily  . Depression/anxiety: sertraline 25 mg daily  Pharmacist Clinical Goal(s):  Marland Kitchen Over the next 90 days, patient will work with PharmD and primary care provider to address optimized glycemic regimen  Interventions: . Comprehensive medication review performed, medication list updated in electronic  medical record . Discussed reducing from BID glucose checks to a few fasting and a few 2 hour post prandial weekly. Continue current regimen . Pending A1c, consider increasing metformin to 1000 mg BID. Could consider addition of DPP4 in future if needed. . Counseled to seek emergent care if any future episodes where patient falls and hits her head, especially given daily antiplatelet therapy. Angela Patterson verbalized understanding.  . Will pass Teresa's question about increasing sertraline to address anxiety along to Dr. Nicki Reaper. Discussed that she may want an appointment to discuss the change, or defer to f/u in May.   Patient Self Care Activities:  . Patient will check blood glucose BID , document, and provide at future appointments . Patient will take medications as prescribed . Patient will report any questions or concerns to provider   Please see past updates related to this goal by clicking on the "Past Updates" button in the selected goal            Plan:  - Scheduled f/u call 11/20/19  Catie Darnelle Maffucci, PharmD, BCACP, [place] Pharmacist [place] [phone]

## 2019-09-25 NOTE — Progress Notes (Signed)
Reviewed information.  Agree with assessment and plan.  Will have nurse contact regarding f/u - increasing sertraline?Marland Kitchen

## 2019-09-25 NOTE — Telephone Encounter (Signed)
Received note from Catie that Ms Merlo's daughter [name] was questioning if zoloft needed to be increased.  Having increased anxiety. Please confirm doing ok and  Please schedule appt to discuss.

## 2019-09-25 NOTE — Patient Instructions (Addendum)
Visit Information  Goals Addressed            This Visit's Progress     Patient Stated   . "We want her to stay healthy" (pt-stated)       CARE PLAN ENTRY (see longtitudinal plan of care for additional care plan information)  Current Barriers:  . Diabetes: controlled at relaxed goal of [id number]; complicated by diabetic ulcer; most recent A1c 7.1%  o [name] reports one recent episode: had 2nd COVID shot on a Thursday, that Saturday (09/02/19), she got dizzy and fell in the bathroom and hit her head. Had some throwing up later that day. Teresa's daughter in law is a [employer] scientist, and they discussed seeking emergency care to see if concussed, but decided against it. Patient has had no problems since then.  Domenick Gong asked about reducing frequency of blood sugar checks to reduce stress on her mother o OT, PT, speech therapy have been in the home; notes OT has noted patient has been extremely anxious; they are working with her on deep breathing, relaxation techniques; [name] wondering if sertraline needs to be upped. Notes this is how her mother has always been. Notes that her mother can't be as active as she used to be, so doesn't have an "outlet" for her energy/anxiety . Current antihyperglycemic regimen: metformin XR 1000 mg QAM, 500 mg QPM . Denies hypoglycemia . Current blood sugar readings:  o Fasting: 130-170s o 2 hour post prandial: 190-230s  . Cardiovascular risk reduction: o Current hypertensive regimen: amlodipine 5 mg BID o Current hyperlipidemia regimen: omega 3 fatty acids;  o Antiplatelet regimen: clopidogrel 75 mg daily  . Depression/anxiety: sertraline 25 mg daily  Pharmacist Clinical Goal(s):  Marland Kitchen Over the next 90 days, patient will work with PharmD and primary care provider to address optimized glycemic regimen  Interventions: . Comprehensive medication review performed, medication list updated in electronic medical record . Discussed reducing from BID glucose checks to a few  fasting and a few 2 hour post prandial weekly. Continue current regimen . Pending A1c, consider increasing metformin to 1000 mg BID. Could consider addition of DPP4 in future if needed. . Counseled to seek emergent care if any future episodes where patient falls and hits her head, especially given daily antiplatelet therapy. [name] verbalized understanding.  . Will pass Teresa's question about increasing sertraline to address anxiety along to Dr. Nicki Reaper. Discussed that she may want an appointment to discuss the change, or defer to f/u in May.   Patient Self Care Activities:  . Patient will check blood glucose BID , document, and provide at future appointments . Patient will take medications as prescribed . Patient will report any questions or concerns to provider   Please see past updates related to this goal by clicking on the "Past Updates" button in the selected goal           Patient verbalizes understanding of instructions provided today.    Plan:  - Scheduled f/u call 11/20/19  Catie Darnelle Maffucci, PharmD, BCACP, CPP Clinical Pharmacist [facility] [phone]

## 2019-09-26 DIAGNOSIS — I10 Essential (primary) hypertension: Secondary | ICD-10-CM | POA: Diagnosis not present

## 2019-09-26 DIAGNOSIS — F419 Anxiety disorder, unspecified: Secondary | ICD-10-CM | POA: Diagnosis not present

## 2019-09-26 DIAGNOSIS — Z7984 Long term (current) use of oral hypoglycemic drugs: Secondary | ICD-10-CM | POA: Diagnosis not present

## 2019-09-26 DIAGNOSIS — E1140 Type 2 diabetes mellitus with diabetic neuropathy, unspecified: Secondary | ICD-10-CM | POA: Diagnosis not present

## 2019-09-26 DIAGNOSIS — L97422 Non-pressure chronic ulcer of left heel and midfoot with fat layer exposed: Secondary | ICD-10-CM | POA: Diagnosis not present

## 2019-09-26 DIAGNOSIS — E11621 Type 2 diabetes mellitus with foot ulcer: Secondary | ICD-10-CM | POA: Diagnosis not present

## 2019-09-26 NOTE — Telephone Encounter (Signed)
Spoke with patients daughter. Stated that patient has been a little more anxious- fidgety, ringing hands, etc. No behavioral changes or any other acute symptoms. I have scheduled patient for Wednesday at 8:30

## 2019-09-27 ENCOUNTER — Telehealth (INDEPENDENT_AMBULATORY_CARE_PROVIDER_SITE_OTHER): Payer: Medicare Other | Admitting: Internal Medicine

## 2019-09-27 ENCOUNTER — Encounter: Payer: Self-pay | Admitting: Internal Medicine

## 2019-09-27 ENCOUNTER — Other Ambulatory Visit: Payer: Self-pay

## 2019-09-27 DIAGNOSIS — L97529 Non-pressure chronic ulcer of other part of left foot with unspecified severity: Secondary | ICD-10-CM | POA: Diagnosis not present

## 2019-09-27 DIAGNOSIS — D696 Thrombocytopenia, unspecified: Secondary | ICD-10-CM

## 2019-09-27 DIAGNOSIS — F419 Anxiety disorder, unspecified: Secondary | ICD-10-CM | POA: Diagnosis not present

## 2019-09-27 DIAGNOSIS — R197 Diarrhea, unspecified: Secondary | ICD-10-CM

## 2019-09-27 DIAGNOSIS — I10 Essential (primary) hypertension: Secondary | ICD-10-CM

## 2019-09-27 DIAGNOSIS — E7800 Pure hypercholesterolemia, unspecified: Secondary | ICD-10-CM

## 2019-09-27 DIAGNOSIS — E1140 Type 2 diabetes mellitus with diabetic neuropathy, unspecified: Secondary | ICD-10-CM

## 2019-09-27 DIAGNOSIS — Z8673 Personal history of transient ischemic attack (TIA), and cerebral infarction without residual deficits: Secondary | ICD-10-CM

## 2019-09-27 MED ORDER — GLUCOSE BLOOD VI STRP: ORAL_STRIP | 1 refills | Status: DC

## 2019-09-27 MED ORDER — SERTRALINE HCL 25 MG PO TABS: ORAL_TABLET | ORAL | 0 refills | Status: DC

## 2019-09-27 NOTE — Progress Notes (Signed)
Patient ID: Angela Patterson, female   DOB: 24-Mar-1921, 86 y.o.   MRN: [id number]   Virtual Visit via telephone Note  This visit type was conducted due to national recommendations for restrictions regarding the COVID-19 pandemic (e.g. social distancing).  This format is felt to be most appropriate for this patient at this time.  All issues noted in this document were discussed and addressed.  No physical exam was performed (except for noted visual exam findings with Video Visits).   I connected with [name] by a video enabled telemedicine application or telephone and verified that I am speaking with the correct person using two identifiers. Location patient: home Location provider: work Persons participating in the telephone visit: patient, provider and her daughter [name]  The limitations, risks, security and privacy concerns of performing an evaluation and management service by telephone and the availability of in person appointments have been discussed.  The patient expressed understanding and agreed to proceed.   Reason for visit: work in appt.   HPI: Daughter called in concerned about pt having more issues with anxiety.  States has noticed her being more "fidgety" and ringing her hands (for example when speech therapy came).  On zoloft.  Discussed increasing dose.  Worries.  Trying to stay active.  PT, OT and ST coming out to work with pt.  As the weather improves, she will get outside more.  Daughter thinks this will help.  She did fall this past week.  Bumped her head.  States occurred a couple days after receiving the covid vaccine.  Reports had emesis 2-3 hours after fall.  D-n-l assessed pt.  Has had no headache.  No dizziness.  No nausea or vomiting.  No change in mental status since fall.  No LOC.  Reports bowels are better.  She is off dairy products, probiotics, greasy food.  Also off city water.  Is drinking bottled water.  No diarrhea now. Blood pressure doing well.     ROS:  See pertinent positives and negatives per HPI.  Past Medical History:  Diagnosis Date  . Allergy   . Chicken pox   . CVA (cerebral vascular accident) ([facility])   . Diabetes mellitus ([facility])   . Hypercholesterolemia   . Hypertension     Past Surgical History:  Procedure Laterality Date  . ABDOMINAL HYSTERECTOMY  1971   excessive bleeding  . APPENDECTOMY  age 26  . BREAST LUMPECTOMY  1958   benign  . DILATION AND CURETTAGE OF UTERUS  1970    Family History  Problem Relation Age of Onset  . Liver cancer Father   . Stroke Mother   . Heart attack Brother   . Hypertension Daughter   . Hypertension Son   . Hypertension Daughter   . Cancer Grandchild        breast  . Diabetes Grandchild   . Breast cancer Neg Hx   . Colon cancer Neg Hx     SOCIAL HX: reviewed.    Current Outpatient Medications:  .  amLODipine (NORVASC) 5 MG tablet, TAKE 1 TABLET BY MOUTH  TWICE DAILY, Disp: 180 tablet, Rfl: 3 .  Cholecalciferol (VITAMIN D) 2000 units tablet, Take 2,000 Units by mouth daily. , Disp: , Rfl:  .  clopidogrel (PLAVIX) 75 MG tablet, TAKE 1 TABLET BY MOUTH  DAILY, Disp: 90 tablet, Rfl: 3 .  glucose blood test strip, USE TO CHECK BLOOD SUGAR  TWO TIMES DAILY. Dx E11.9, Disp: 200 each, Rfl: 1 .  hydrocortisone (PROCTOCORT) 1 % CREA, Apply thin layer around rectal opening 2 times per day to help hemorrhoids, Disp: 1 Tube, Rfl: 1 .  Lancet Devices (ONE TOUCH DELICA LANCING DEV) MISC, Use twice daily Dx: 250.00, Disp: 100 each, Rfl: 5 .  metFORMIN (GLUCOPHAGE-XR) 500 MG 24 hr tablet, Take 2 tablets (1,000 mg total) by mouth 2 (two) times daily., Disp: 360 tablet, Rfl: 3 .  OMEGA-3 FATTY ACIDS PO, Take 720 mg by mouth daily., Disp: , Rfl:  .  Probiotic Product (PROBIOTIC DAILY PO), Take 1 tablet by mouth daily., Disp: , Rfl:  .  sertraline (ZOLOFT) 25 MG tablet, Take 1-2 tablets per day, Disp: 180 tablet, Rfl: 0 .  timolol (BETIMOL) 0.5 % ophthalmic solution, 1 drop 2 (two) times daily., Disp:  , Rfl:   EXAM:  GENERAL: alert.  Sounds to be in no acute distress.  Answering questions appropriately.    PSYCH/NEURO: pleasant and cooperative, no obvious depression or anxiety, speech and thought processing grossly intact  ASSESSMENT AND PLAN:  Discussed the following assessment and plan:  Anxiety Daughter reports increased anxiety as outlined.  On zoloft.  Will increase to 37.9m q day and then 571mq day if tolerated.  Follow.    Diabetes mellitus On metformin.  No bowel issues now.  Follow met b and a1c.    Diarrhea Has adjusted diet as outlined.  Diarrhea better.  No problem now. Follow.    Essential (primary) hypertension Blood pressure per report has been doing well.  Continue amlodipine.  Follow pressures.  Follow metabolic panel.   Foot ulcer (HCFertileBeing followed by podiatry.    History of CVA (cerebrovascular accident) Maintained on plavix.  Follow.    Hypercholesterolemia Follow lipid panel.    Thrombocytopenia (HCC) Platelet count on last check wnl.  Follow.     Meds ordered this encounter  Medications  . sertraline (ZOLOFT) 25 MG tablet    Sig: Take 1-2 tablets per day    Dispense:  180 tablet    Refill:  0  . glucose blood test strip    Sig: USE TO CHECK BLOOD SUGAR  TWO TIMES DAILY. Dx E11.9    Dispense:  200 each    Refill:  1    Patient has one touch ultra meter.     I discussed the assessment and treatment plan with the patient. The patient was provided an opportunity to ask questions and all were answered. The patient agreed with the plan and demonstrated an understanding of the instructions.   The patient was advised to call back or seek an in-person evaluation if the symptoms worsen or if the condition fails to improve as anticipated.  I provided 25 minutes of non-face-to-face time during this encounter.   ChEinar PheasantMD

## 2019-09-28 DIAGNOSIS — E11621 Type 2 diabetes mellitus with foot ulcer: Secondary | ICD-10-CM | POA: Diagnosis not present

## 2019-09-28 DIAGNOSIS — Z7984 Long term (current) use of oral hypoglycemic drugs: Secondary | ICD-10-CM | POA: Diagnosis not present

## 2019-09-28 DIAGNOSIS — F419 Anxiety disorder, unspecified: Secondary | ICD-10-CM | POA: Diagnosis not present

## 2019-09-28 DIAGNOSIS — I10 Essential (primary) hypertension: Secondary | ICD-10-CM | POA: Diagnosis not present

## 2019-09-28 DIAGNOSIS — L97422 Non-pressure chronic ulcer of left heel and midfoot with fat layer exposed: Secondary | ICD-10-CM | POA: Diagnosis not present

## 2019-09-28 DIAGNOSIS — E1140 Type 2 diabetes mellitus with diabetic neuropathy, unspecified: Secondary | ICD-10-CM | POA: Diagnosis not present

## 2019-09-29 DIAGNOSIS — Z7984 Long term (current) use of oral hypoglycemic drugs: Secondary | ICD-10-CM | POA: Diagnosis not present

## 2019-09-29 DIAGNOSIS — I10 Essential (primary) hypertension: Secondary | ICD-10-CM | POA: Diagnosis not present

## 2019-09-29 DIAGNOSIS — E11621 Type 2 diabetes mellitus with foot ulcer: Secondary | ICD-10-CM | POA: Diagnosis not present

## 2019-09-29 DIAGNOSIS — L97422 Non-pressure chronic ulcer of left heel and midfoot with fat layer exposed: Secondary | ICD-10-CM | POA: Diagnosis not present

## 2019-09-29 DIAGNOSIS — E1140 Type 2 diabetes mellitus with diabetic neuropathy, unspecified: Secondary | ICD-10-CM | POA: Diagnosis not present

## 2019-09-29 DIAGNOSIS — F419 Anxiety disorder, unspecified: Secondary | ICD-10-CM | POA: Diagnosis not present

## 2019-10-02 ENCOUNTER — Encounter: Payer: Self-pay | Admitting: Internal Medicine

## 2019-10-02 NOTE — Assessment & Plan Note (Signed)
Follow lipid panel.   

## 2019-10-02 NOTE — Assessment & Plan Note (Signed)
Maintained on plavix.  Follow.

## 2019-10-02 NOTE — Assessment & Plan Note (Signed)
Daughter reports increased anxiety as outlined.  On zoloft.  Will increase to 37.5mg  q day and then 50mg  q day if tolerated.  Follow.

## 2019-10-02 NOTE — Assessment & Plan Note (Signed)
Blood pressure per report has been doing well.  Continue amlodipine.  Follow pressures.  Follow metabolic panel.

## 2019-10-02 NOTE — Assessment & Plan Note (Signed)
On metformin.  No bowel issues now.  Follow met b and a1c.

## 2019-10-02 NOTE — Assessment & Plan Note (Signed)
Has adjusted diet as outlined.  Diarrhea better.  No problem now. Follow.

## 2019-10-02 NOTE — Assessment & Plan Note (Signed)
Platelet count on last check wnl.  Follow.

## 2019-10-02 NOTE — Assessment & Plan Note (Signed)
Being followed by podiatry.  °

## 2019-10-03 DIAGNOSIS — L97422 Non-pressure chronic ulcer of left heel and midfoot with fat layer exposed: Secondary | ICD-10-CM | POA: Diagnosis not present

## 2019-10-03 DIAGNOSIS — E11621 Type 2 diabetes mellitus with foot ulcer: Secondary | ICD-10-CM | POA: Diagnosis not present

## 2019-10-03 DIAGNOSIS — E1140 Type 2 diabetes mellitus with diabetic neuropathy, unspecified: Secondary | ICD-10-CM | POA: Diagnosis not present

## 2019-10-03 DIAGNOSIS — Z8673 Personal history of transient ischemic attack (TIA), and cerebral infarction without residual deficits: Secondary | ICD-10-CM | POA: Diagnosis not present

## 2019-10-03 DIAGNOSIS — I10 Essential (primary) hypertension: Secondary | ICD-10-CM | POA: Diagnosis not present

## 2019-10-03 DIAGNOSIS — F419 Anxiety disorder, unspecified: Secondary | ICD-10-CM | POA: Diagnosis not present

## 2019-10-03 DIAGNOSIS — Z7984 Long term (current) use of oral hypoglycemic drugs: Secondary | ICD-10-CM

## 2019-10-04 DIAGNOSIS — E11621 Type 2 diabetes mellitus with foot ulcer: Secondary | ICD-10-CM | POA: Diagnosis not present

## 2019-10-04 DIAGNOSIS — I10 Essential (primary) hypertension: Secondary | ICD-10-CM | POA: Diagnosis not present

## 2019-10-04 DIAGNOSIS — E1140 Type 2 diabetes mellitus with diabetic neuropathy, unspecified: Secondary | ICD-10-CM | POA: Diagnosis not present

## 2019-10-04 DIAGNOSIS — Z7984 Long term (current) use of oral hypoglycemic drugs: Secondary | ICD-10-CM | POA: Diagnosis not present

## 2019-10-04 DIAGNOSIS — L97422 Non-pressure chronic ulcer of left heel and midfoot with fat layer exposed: Secondary | ICD-10-CM | POA: Diagnosis not present

## 2019-10-04 DIAGNOSIS — F419 Anxiety disorder, unspecified: Secondary | ICD-10-CM | POA: Diagnosis not present

## 2019-10-05 DIAGNOSIS — E1140 Type 2 diabetes mellitus with diabetic neuropathy, unspecified: Secondary | ICD-10-CM | POA: Diagnosis not present

## 2019-10-05 DIAGNOSIS — I10 Essential (primary) hypertension: Secondary | ICD-10-CM | POA: Diagnosis not present

## 2019-10-05 DIAGNOSIS — L97422 Non-pressure chronic ulcer of left heel and midfoot with fat layer exposed: Secondary | ICD-10-CM | POA: Diagnosis not present

## 2019-10-05 DIAGNOSIS — Z7984 Long term (current) use of oral hypoglycemic drugs: Secondary | ICD-10-CM | POA: Diagnosis not present

## 2019-10-05 DIAGNOSIS — E11621 Type 2 diabetes mellitus with foot ulcer: Secondary | ICD-10-CM | POA: Diagnosis not present

## 2019-10-05 DIAGNOSIS — F419 Anxiety disorder, unspecified: Secondary | ICD-10-CM | POA: Diagnosis not present

## 2019-10-09 DIAGNOSIS — Z7984 Long term (current) use of oral hypoglycemic drugs: Secondary | ICD-10-CM | POA: Diagnosis not present

## 2019-10-09 DIAGNOSIS — E11621 Type 2 diabetes mellitus with foot ulcer: Secondary | ICD-10-CM | POA: Diagnosis not present

## 2019-10-09 DIAGNOSIS — F419 Anxiety disorder, unspecified: Secondary | ICD-10-CM | POA: Diagnosis not present

## 2019-10-09 DIAGNOSIS — I10 Essential (primary) hypertension: Secondary | ICD-10-CM | POA: Diagnosis not present

## 2019-10-09 DIAGNOSIS — E1140 Type 2 diabetes mellitus with diabetic neuropathy, unspecified: Secondary | ICD-10-CM | POA: Diagnosis not present

## 2019-10-09 DIAGNOSIS — L97422 Non-pressure chronic ulcer of left heel and midfoot with fat layer exposed: Secondary | ICD-10-CM | POA: Diagnosis not present

## 2019-10-10 ENCOUNTER — Ambulatory Visit (INDEPENDENT_AMBULATORY_CARE_PROVIDER_SITE_OTHER): Payer: Medicare Other | Admitting: Podiatry

## 2019-10-10 ENCOUNTER — Encounter: Payer: Self-pay | Admitting: Podiatry

## 2019-10-10 ENCOUNTER — Other Ambulatory Visit: Payer: Self-pay

## 2019-10-10 VITALS — Temp 97.7°F

## 2019-10-10 DIAGNOSIS — L97522 Non-pressure chronic ulcer of other part of left foot with fat layer exposed: Secondary | ICD-10-CM | POA: Diagnosis not present

## 2019-10-10 DIAGNOSIS — E0843 Diabetes mellitus due to underlying condition with diabetic autonomic (poly)neuropathy: Secondary | ICD-10-CM

## 2019-10-11 ENCOUNTER — Telehealth: Payer: Self-pay | Admitting: Podiatry

## 2019-10-11 NOTE — Telephone Encounter (Signed)
pts daughter left message checking on pts diabetic shoes that were ordered.   I returned call and they should be shipping the 1st week in April and I will call when they come in to schedule an appt to pick them up.Marland KitchenMarland Kitchen

## 2019-10-12 DIAGNOSIS — E11621 Type 2 diabetes mellitus with foot ulcer: Secondary | ICD-10-CM | POA: Diagnosis not present

## 2019-10-12 DIAGNOSIS — Z7984 Long term (current) use of oral hypoglycemic drugs: Secondary | ICD-10-CM | POA: Diagnosis not present

## 2019-10-12 DIAGNOSIS — F419 Anxiety disorder, unspecified: Secondary | ICD-10-CM | POA: Diagnosis not present

## 2019-10-12 DIAGNOSIS — L97422 Non-pressure chronic ulcer of left heel and midfoot with fat layer exposed: Secondary | ICD-10-CM | POA: Diagnosis not present

## 2019-10-12 DIAGNOSIS — I10 Essential (primary) hypertension: Secondary | ICD-10-CM | POA: Diagnosis not present

## 2019-10-12 DIAGNOSIS — E1140 Type 2 diabetes mellitus with diabetic neuropathy, unspecified: Secondary | ICD-10-CM | POA: Diagnosis not present

## 2019-10-12 NOTE — Progress Notes (Signed)
   Subjective:  86 year old female with PMHx of DM presenting today for follow up evaluation of an ulceration of the left foot. She states the wound looks better. She reports a decrease in drainage. She has been having the dressing changed at home as directed. There are no modifying factors noted. Patient is here for further evaluation and treatment.   Past Medical History:  Diagnosis Date  . Allergy   . Chicken pox   . CVA (cerebral vascular accident) ([facility])   . Diabetes mellitus ([facility])   . Hypercholesterolemia   . Hypertension       Objective/Physical Exam General: The patient is alert and oriented x3 in no acute distress.  Dermatology:  Wound #1 noted to the sub-first MPJ of the left foot measuring 0.7 x 0.7 x 0.2 cm (LxWxD).     To the noted ulceration(s), there is no eschar. There is a moderate amount of slough, fibrin, and necrotic tissue noted. Granulation tissue and wound base is red. There is a minimal amount of serosanguineous drainage noted. There is no exposed bone muscle-tendon ligament or joint. There is no malodor. Periwound integrity is intact. Skin is warm, dry and supple bilateral lower extremities.   Vascular: Palpable pedal pulses bilaterally. No edema or erythema noted. Capillary refill within normal limits.  Neurological: Epicritic and protective threshold diminished bilaterally.   Musculoskeletal Exam: Range of motion within normal limits to all pedal and ankle joints bilateral. Muscle strength 5/5 in all groups bilateral.   Assessment: #1 ulceration of the sub-first MPJ of the left foot secondary to diabetes mellitus #2 diabetes mellitus w/ peripheral neuropathy  Plan of Care:  #1 Patient was evaluated. #2 Medically necessary excisional debridement including subcutaneous tissue was performed using a tissue nipper and a chisel blade. Excisional debridement of all the necrotic nonviable tissue down to healthy bleeding viable tissue was performed with  post-debridement measurements same as pre-. #3 The wound was cleansed and dry sterile dressing applied. #4 Continue home dressing changes.  #5 Continue using DM shoes.  #6 Return to clinic in 3 weeks.     [name], DPM [facility]  Dr. [name], [address]                                        [place], [place] [address]                Office [phone]  Fax [phone]

## 2019-10-13 DIAGNOSIS — E1140 Type 2 diabetes mellitus with diabetic neuropathy, unspecified: Secondary | ICD-10-CM | POA: Diagnosis not present

## 2019-10-13 DIAGNOSIS — Z7984 Long term (current) use of oral hypoglycemic drugs: Secondary | ICD-10-CM | POA: Diagnosis not present

## 2019-10-13 DIAGNOSIS — L97422 Non-pressure chronic ulcer of left heel and midfoot with fat layer exposed: Secondary | ICD-10-CM | POA: Diagnosis not present

## 2019-10-13 DIAGNOSIS — E11621 Type 2 diabetes mellitus with foot ulcer: Secondary | ICD-10-CM | POA: Diagnosis not present

## 2019-10-13 DIAGNOSIS — F419 Anxiety disorder, unspecified: Secondary | ICD-10-CM | POA: Diagnosis not present

## 2019-10-13 DIAGNOSIS — I10 Essential (primary) hypertension: Secondary | ICD-10-CM | POA: Diagnosis not present

## 2019-10-13 DIAGNOSIS — Z8673 Personal history of transient ischemic attack (TIA), and cerebral infarction without residual deficits: Secondary | ICD-10-CM | POA: Diagnosis not present

## 2019-10-16 DIAGNOSIS — E11621 Type 2 diabetes mellitus with foot ulcer: Secondary | ICD-10-CM | POA: Diagnosis not present

## 2019-10-16 DIAGNOSIS — I10 Essential (primary) hypertension: Secondary | ICD-10-CM | POA: Diagnosis not present

## 2019-10-16 DIAGNOSIS — L97422 Non-pressure chronic ulcer of left heel and midfoot with fat layer exposed: Secondary | ICD-10-CM | POA: Diagnosis not present

## 2019-10-16 DIAGNOSIS — E1140 Type 2 diabetes mellitus with diabetic neuropathy, unspecified: Secondary | ICD-10-CM | POA: Diagnosis not present

## 2019-10-16 DIAGNOSIS — Z7984 Long term (current) use of oral hypoglycemic drugs: Secondary | ICD-10-CM | POA: Diagnosis not present

## 2019-10-16 DIAGNOSIS — F419 Anxiety disorder, unspecified: Secondary | ICD-10-CM | POA: Diagnosis not present

## 2019-10-17 DIAGNOSIS — Z7984 Long term (current) use of oral hypoglycemic drugs: Secondary | ICD-10-CM | POA: Diagnosis not present

## 2019-10-17 DIAGNOSIS — E1140 Type 2 diabetes mellitus with diabetic neuropathy, unspecified: Secondary | ICD-10-CM | POA: Diagnosis not present

## 2019-10-17 DIAGNOSIS — F419 Anxiety disorder, unspecified: Secondary | ICD-10-CM | POA: Diagnosis not present

## 2019-10-17 DIAGNOSIS — E11621 Type 2 diabetes mellitus with foot ulcer: Secondary | ICD-10-CM | POA: Diagnosis not present

## 2019-10-17 DIAGNOSIS — I10 Essential (primary) hypertension: Secondary | ICD-10-CM | POA: Diagnosis not present

## 2019-10-17 DIAGNOSIS — L97422 Non-pressure chronic ulcer of left heel and midfoot with fat layer exposed: Secondary | ICD-10-CM | POA: Diagnosis not present

## 2019-10-18 DIAGNOSIS — F419 Anxiety disorder, unspecified: Secondary | ICD-10-CM | POA: Diagnosis not present

## 2019-10-18 DIAGNOSIS — I10 Essential (primary) hypertension: Secondary | ICD-10-CM | POA: Diagnosis not present

## 2019-10-18 DIAGNOSIS — Z7984 Long term (current) use of oral hypoglycemic drugs: Secondary | ICD-10-CM | POA: Diagnosis not present

## 2019-10-18 DIAGNOSIS — E1140 Type 2 diabetes mellitus with diabetic neuropathy, unspecified: Secondary | ICD-10-CM | POA: Diagnosis not present

## 2019-10-18 DIAGNOSIS — E11621 Type 2 diabetes mellitus with foot ulcer: Secondary | ICD-10-CM | POA: Diagnosis not present

## 2019-10-18 DIAGNOSIS — L97422 Non-pressure chronic ulcer of left heel and midfoot with fat layer exposed: Secondary | ICD-10-CM | POA: Diagnosis not present

## 2019-10-19 DIAGNOSIS — L97422 Non-pressure chronic ulcer of left heel and midfoot with fat layer exposed: Secondary | ICD-10-CM | POA: Diagnosis not present

## 2019-10-19 DIAGNOSIS — F419 Anxiety disorder, unspecified: Secondary | ICD-10-CM | POA: Diagnosis not present

## 2019-10-19 DIAGNOSIS — Z7984 Long term (current) use of oral hypoglycemic drugs: Secondary | ICD-10-CM | POA: Diagnosis not present

## 2019-10-19 DIAGNOSIS — I10 Essential (primary) hypertension: Secondary | ICD-10-CM | POA: Diagnosis not present

## 2019-10-19 DIAGNOSIS — E1140 Type 2 diabetes mellitus with diabetic neuropathy, unspecified: Secondary | ICD-10-CM | POA: Diagnosis not present

## 2019-10-19 DIAGNOSIS — E11621 Type 2 diabetes mellitus with foot ulcer: Secondary | ICD-10-CM | POA: Diagnosis not present

## 2019-10-20 DIAGNOSIS — L97422 Non-pressure chronic ulcer of left heel and midfoot with fat layer exposed: Secondary | ICD-10-CM | POA: Diagnosis not present

## 2019-10-20 DIAGNOSIS — E11621 Type 2 diabetes mellitus with foot ulcer: Secondary | ICD-10-CM | POA: Diagnosis not present

## 2019-10-20 DIAGNOSIS — Z7984 Long term (current) use of oral hypoglycemic drugs: Secondary | ICD-10-CM | POA: Diagnosis not present

## 2019-10-20 DIAGNOSIS — F419 Anxiety disorder, unspecified: Secondary | ICD-10-CM | POA: Diagnosis not present

## 2019-10-20 DIAGNOSIS — I10 Essential (primary) hypertension: Secondary | ICD-10-CM | POA: Diagnosis not present

## 2019-10-20 DIAGNOSIS — E1140 Type 2 diabetes mellitus with diabetic neuropathy, unspecified: Secondary | ICD-10-CM | POA: Diagnosis not present

## 2019-10-23 DIAGNOSIS — I10 Essential (primary) hypertension: Secondary | ICD-10-CM | POA: Diagnosis not present

## 2019-10-23 DIAGNOSIS — L97422 Non-pressure chronic ulcer of left heel and midfoot with fat layer exposed: Secondary | ICD-10-CM | POA: Diagnosis not present

## 2019-10-23 DIAGNOSIS — E11621 Type 2 diabetes mellitus with foot ulcer: Secondary | ICD-10-CM | POA: Diagnosis not present

## 2019-10-23 DIAGNOSIS — E1140 Type 2 diabetes mellitus with diabetic neuropathy, unspecified: Secondary | ICD-10-CM | POA: Diagnosis not present

## 2019-10-23 DIAGNOSIS — Z7984 Long term (current) use of oral hypoglycemic drugs: Secondary | ICD-10-CM | POA: Diagnosis not present

## 2019-10-23 DIAGNOSIS — F419 Anxiety disorder, unspecified: Secondary | ICD-10-CM | POA: Diagnosis not present

## 2019-10-25 ENCOUNTER — Telehealth: Payer: Self-pay | Admitting: Internal Medicine

## 2019-10-25 DIAGNOSIS — F419 Anxiety disorder, unspecified: Secondary | ICD-10-CM | POA: Diagnosis not present

## 2019-10-25 DIAGNOSIS — E11621 Type 2 diabetes mellitus with foot ulcer: Secondary | ICD-10-CM | POA: Diagnosis not present

## 2019-10-25 DIAGNOSIS — Z7984 Long term (current) use of oral hypoglycemic drugs: Secondary | ICD-10-CM | POA: Diagnosis not present

## 2019-10-25 DIAGNOSIS — L97422 Non-pressure chronic ulcer of left heel and midfoot with fat layer exposed: Secondary | ICD-10-CM | POA: Diagnosis not present

## 2019-10-25 DIAGNOSIS — E1140 Type 2 diabetes mellitus with diabetic neuropathy, unspecified: Secondary | ICD-10-CM | POA: Diagnosis not present

## 2019-10-25 DIAGNOSIS — I10 Essential (primary) hypertension: Secondary | ICD-10-CM | POA: Diagnosis not present

## 2019-10-25 NOTE — Chronic Care Management (AMB) (Signed)
  Care Management   Note  10/25/2019 Name: Angela Patterson MRN: [id number] DOB: Sep 27, 1920  Angela Patterson is a 86 y.o. year old female who is a primary care patient of Einar Pheasant, MD and is actively engaged with the care management team. I reached out to [name] by phone today to assist with re-scheduling a follow up visit with the Pharmacist  Follow up plan: Unsuccessful telephone outreach attempt made. A HIPPA compliant phone message was left for the patient providing contact information and requesting a return call.  The care management team will reach out to the patient again over the next 7 days.  If patient returns call to provider office, please advise to call [facility] [place], [place] [facility]  [place], [place] [address] Direct Dial: [email or web address] Website: [email or web address]

## 2019-10-27 DIAGNOSIS — L97422 Non-pressure chronic ulcer of left heel and midfoot with fat layer exposed: Secondary | ICD-10-CM | POA: Diagnosis not present

## 2019-10-27 DIAGNOSIS — Z7984 Long term (current) use of oral hypoglycemic drugs: Secondary | ICD-10-CM | POA: Diagnosis not present

## 2019-10-27 DIAGNOSIS — I10 Essential (primary) hypertension: Secondary | ICD-10-CM | POA: Diagnosis not present

## 2019-10-27 DIAGNOSIS — E1140 Type 2 diabetes mellitus with diabetic neuropathy, unspecified: Secondary | ICD-10-CM | POA: Diagnosis not present

## 2019-10-27 DIAGNOSIS — F419 Anxiety disorder, unspecified: Secondary | ICD-10-CM | POA: Diagnosis not present

## 2019-10-27 DIAGNOSIS — E11621 Type 2 diabetes mellitus with foot ulcer: Secondary | ICD-10-CM | POA: Diagnosis not present

## 2019-10-30 DIAGNOSIS — E1140 Type 2 diabetes mellitus with diabetic neuropathy, unspecified: Secondary | ICD-10-CM | POA: Diagnosis not present

## 2019-10-30 DIAGNOSIS — I10 Essential (primary) hypertension: Secondary | ICD-10-CM | POA: Diagnosis not present

## 2019-10-30 DIAGNOSIS — L97422 Non-pressure chronic ulcer of left heel and midfoot with fat layer exposed: Secondary | ICD-10-CM | POA: Diagnosis not present

## 2019-10-30 DIAGNOSIS — F419 Anxiety disorder, unspecified: Secondary | ICD-10-CM | POA: Diagnosis not present

## 2019-10-30 DIAGNOSIS — E11621 Type 2 diabetes mellitus with foot ulcer: Secondary | ICD-10-CM | POA: Diagnosis not present

## 2019-10-30 DIAGNOSIS — Z7984 Long term (current) use of oral hypoglycemic drugs: Secondary | ICD-10-CM | POA: Diagnosis not present

## 2019-10-31 ENCOUNTER — Ambulatory Visit (INDEPENDENT_AMBULATORY_CARE_PROVIDER_SITE_OTHER): Payer: Medicare Other | Admitting: Podiatry

## 2019-10-31 ENCOUNTER — Other Ambulatory Visit: Payer: Self-pay

## 2019-10-31 DIAGNOSIS — F419 Anxiety disorder, unspecified: Secondary | ICD-10-CM | POA: Diagnosis not present

## 2019-10-31 DIAGNOSIS — Z7984 Long term (current) use of oral hypoglycemic drugs: Secondary | ICD-10-CM | POA: Diagnosis not present

## 2019-10-31 DIAGNOSIS — E0843 Diabetes mellitus due to underlying condition with diabetic autonomic (poly)neuropathy: Secondary | ICD-10-CM

## 2019-10-31 DIAGNOSIS — I10 Essential (primary) hypertension: Secondary | ICD-10-CM | POA: Diagnosis not present

## 2019-10-31 DIAGNOSIS — L97522 Non-pressure chronic ulcer of other part of left foot with fat layer exposed: Secondary | ICD-10-CM

## 2019-10-31 DIAGNOSIS — E11621 Type 2 diabetes mellitus with foot ulcer: Secondary | ICD-10-CM | POA: Diagnosis not present

## 2019-10-31 DIAGNOSIS — L97422 Non-pressure chronic ulcer of left heel and midfoot with fat layer exposed: Secondary | ICD-10-CM | POA: Diagnosis not present

## 2019-10-31 DIAGNOSIS — E1140 Type 2 diabetes mellitus with diabetic neuropathy, unspecified: Secondary | ICD-10-CM | POA: Diagnosis not present

## 2019-10-31 NOTE — Telephone Encounter (Signed)
Error opened by mistake

## 2019-11-02 DIAGNOSIS — E1140 Type 2 diabetes mellitus with diabetic neuropathy, unspecified: Secondary | ICD-10-CM | POA: Diagnosis not present

## 2019-11-02 DIAGNOSIS — I10 Essential (primary) hypertension: Secondary | ICD-10-CM | POA: Diagnosis not present

## 2019-11-02 DIAGNOSIS — L97422 Non-pressure chronic ulcer of left heel and midfoot with fat layer exposed: Secondary | ICD-10-CM | POA: Diagnosis not present

## 2019-11-02 DIAGNOSIS — E11621 Type 2 diabetes mellitus with foot ulcer: Secondary | ICD-10-CM | POA: Diagnosis not present

## 2019-11-02 DIAGNOSIS — F419 Anxiety disorder, unspecified: Secondary | ICD-10-CM | POA: Diagnosis not present

## 2019-11-02 DIAGNOSIS — Z7984 Long term (current) use of oral hypoglycemic drugs: Secondary | ICD-10-CM | POA: Diagnosis not present

## 2019-11-03 DIAGNOSIS — I10 Essential (primary) hypertension: Secondary | ICD-10-CM | POA: Diagnosis not present

## 2019-11-03 DIAGNOSIS — F419 Anxiety disorder, unspecified: Secondary | ICD-10-CM | POA: Diagnosis not present

## 2019-11-03 DIAGNOSIS — E11621 Type 2 diabetes mellitus with foot ulcer: Secondary | ICD-10-CM | POA: Diagnosis not present

## 2019-11-03 DIAGNOSIS — Z7984 Long term (current) use of oral hypoglycemic drugs: Secondary | ICD-10-CM | POA: Diagnosis not present

## 2019-11-03 DIAGNOSIS — E1140 Type 2 diabetes mellitus with diabetic neuropathy, unspecified: Secondary | ICD-10-CM | POA: Diagnosis not present

## 2019-11-03 DIAGNOSIS — L97422 Non-pressure chronic ulcer of left heel and midfoot with fat layer exposed: Secondary | ICD-10-CM | POA: Diagnosis not present

## 2019-11-03 NOTE — Chronic Care Management (AMB) (Signed)
  Care Management   Note  11/03/2019 Name: Angela Patterson MRN: [id number] DOB: 05/07/1921  Angela Patterson is a 86 y.o. year old female who is a primary care patient of Einar Pheasant, MD and is actively engaged with the care management team. I reached out to [name] by phone today to assist with re-scheduling a follow up visit with the Pharmacist  Follow up plan: Telephone appointment with care management team member scheduled for:12/25/2019.  [place], [place] [address] Direct Dial: [phone] [email or web address]: [email or web address]

## 2019-11-05 NOTE — Progress Notes (Signed)
   Subjective:  86 year old female with PMHx of DM presenting today for follow up evaluation of an ulceration of the left foot. She states she is doing well and improving. Her daughter states the wound looks better as well. She has been cleaning and dressing the wound every two days. Patient is here for further evaluation and treatment.   Past Medical History:  Diagnosis Date  . Allergy   . Chicken pox   . CVA (cerebral vascular accident) ([place])   . Diabetes mellitus ([facility])   . Hypercholesterolemia   . Hypertension       Objective/Physical Exam General: The patient is alert and oriented x3 in no acute distress.  Dermatology:  Wound #1 noted to the sub-first MPJ of the left foot measuring 0.3 x 0.3 x 0.1 cm (LxWxD).     To the noted ulceration(s), there is no eschar. There is a moderate amount of slough, fibrin, and necrotic tissue noted. Granulation tissue and wound base is red. There is a minimal amount of serosanguineous drainage noted. There is no exposed bone muscle-tendon ligament or joint. There is no malodor. Periwound integrity is intact. Skin is warm, dry and supple bilateral lower extremities.   Vascular: Palpable pedal pulses bilaterally. No edema or erythema noted. Capillary refill within normal limits.  Neurological: Epicritic and protective threshold diminished bilaterally.   Musculoskeletal Exam: Range of motion within normal limits to all pedal and ankle joints bilateral. Muscle strength 5/5 in all groups bilateral.   Assessment: #1 ulceration of the sub-first MPJ of the left foot secondary to diabetes mellitus #2 diabetes mellitus w/ peripheral neuropathy  Plan of Care:  #1 Patient was evaluated. #2 Medically necessary excisional debridement including subcutaneous tissue was performed using a tissue nipper and a chisel blade. Excisional debridement of all the necrotic nonviable tissue down to healthy bleeding viable tissue was performed with post-debridement  measurements same as pre-. #3 The wound was cleansed and dry sterile dressing applied. #4 DM shoes dispensed today.  #5 Offloading dancer's pads applied to insoles.  #6 Continue home dressing changes.   #7 Return to clinic in 3 weeks.    [name], DPM [facility]  Dr. [name], [place]                                        [place], [place] [address]                Office [phone]  Fax [phone]

## 2019-11-09 DIAGNOSIS — Z7984 Long term (current) use of oral hypoglycemic drugs: Secondary | ICD-10-CM | POA: Diagnosis not present

## 2019-11-09 DIAGNOSIS — E1140 Type 2 diabetes mellitus with diabetic neuropathy, unspecified: Secondary | ICD-10-CM | POA: Diagnosis not present

## 2019-11-09 DIAGNOSIS — F419 Anxiety disorder, unspecified: Secondary | ICD-10-CM | POA: Diagnosis not present

## 2019-11-09 DIAGNOSIS — I10 Essential (primary) hypertension: Secondary | ICD-10-CM | POA: Diagnosis not present

## 2019-11-09 DIAGNOSIS — L97422 Non-pressure chronic ulcer of left heel and midfoot with fat layer exposed: Secondary | ICD-10-CM | POA: Diagnosis not present

## 2019-11-09 DIAGNOSIS — E11621 Type 2 diabetes mellitus with foot ulcer: Secondary | ICD-10-CM | POA: Diagnosis not present

## 2019-11-10 DIAGNOSIS — L97422 Non-pressure chronic ulcer of left heel and midfoot with fat layer exposed: Secondary | ICD-10-CM | POA: Diagnosis not present

## 2019-11-10 DIAGNOSIS — F419 Anxiety disorder, unspecified: Secondary | ICD-10-CM | POA: Diagnosis not present

## 2019-11-10 DIAGNOSIS — I10 Essential (primary) hypertension: Secondary | ICD-10-CM | POA: Diagnosis not present

## 2019-11-10 DIAGNOSIS — E1140 Type 2 diabetes mellitus with diabetic neuropathy, unspecified: Secondary | ICD-10-CM | POA: Diagnosis not present

## 2019-11-10 DIAGNOSIS — Z7984 Long term (current) use of oral hypoglycemic drugs: Secondary | ICD-10-CM | POA: Diagnosis not present

## 2019-11-10 DIAGNOSIS — E11621 Type 2 diabetes mellitus with foot ulcer: Secondary | ICD-10-CM | POA: Diagnosis not present

## 2019-11-12 DIAGNOSIS — E11621 Type 2 diabetes mellitus with foot ulcer: Secondary | ICD-10-CM | POA: Diagnosis not present

## 2019-11-17 ENCOUNTER — Other Ambulatory Visit: Payer: Self-pay | Admitting: Internal Medicine

## 2019-11-20 ENCOUNTER — Telehealth: Payer: Medicare Other

## 2019-11-21 ENCOUNTER — Ambulatory Visit (INDEPENDENT_AMBULATORY_CARE_PROVIDER_SITE_OTHER): Payer: Medicare Other | Admitting: Podiatry

## 2019-11-21 ENCOUNTER — Encounter: Payer: Self-pay | Admitting: Podiatry

## 2019-11-21 ENCOUNTER — Other Ambulatory Visit: Payer: Self-pay

## 2019-11-21 VITALS — Temp 97.6°F

## 2019-11-21 DIAGNOSIS — L97522 Non-pressure chronic ulcer of other part of left foot with fat layer exposed: Secondary | ICD-10-CM

## 2019-11-21 DIAGNOSIS — E0843 Diabetes mellitus due to underlying condition with diabetic autonomic (poly)neuropathy: Secondary | ICD-10-CM | POA: Diagnosis not present

## 2019-11-22 ENCOUNTER — Telehealth: Payer: Self-pay | Admitting: Internal Medicine

## 2019-11-22 DIAGNOSIS — F419 Anxiety disorder, unspecified: Secondary | ICD-10-CM | POA: Diagnosis not present

## 2019-11-22 DIAGNOSIS — I10 Essential (primary) hypertension: Secondary | ICD-10-CM | POA: Diagnosis not present

## 2019-11-22 DIAGNOSIS — E11622 Type 2 diabetes mellitus with other skin ulcer: Secondary | ICD-10-CM

## 2019-11-22 DIAGNOSIS — E1369 Other specified diabetes mellitus with other specified complication: Secondary | ICD-10-CM

## 2019-11-22 DIAGNOSIS — Z8673 Personal history of transient ischemic attack (TIA), and cerebral infarction without residual deficits: Secondary | ICD-10-CM

## 2019-11-22 DIAGNOSIS — E1140 Type 2 diabetes mellitus with diabetic neuropathy, unspecified: Secondary | ICD-10-CM

## 2019-11-22 DIAGNOSIS — Z7984 Long term (current) use of oral hypoglycemic drugs: Secondary | ICD-10-CM

## 2019-11-22 DIAGNOSIS — E11621 Type 2 diabetes mellitus with foot ulcer: Secondary | ICD-10-CM | POA: Diagnosis not present

## 2019-11-22 DIAGNOSIS — R531 Weakness: Secondary | ICD-10-CM

## 2019-11-22 DIAGNOSIS — R1312 Dysphagia, oropharyngeal phase: Secondary | ICD-10-CM

## 2019-11-22 NOTE — Telephone Encounter (Signed)
Nurse called from [facility] about pt she fail last nigh at home has small tare on right leg.

## 2019-11-23 NOTE — Telephone Encounter (Signed)
Called patients daughter to confirm ok. Fell night before last. Did not head. Area on leg is not open. She put band aid on it and the next morning there was no blood or anything. Daughter stated that patient is doing well overall. Advised to let us know if she needs anything.

## 2019-11-24 NOTE — Progress Notes (Signed)
   Subjective:  86 year old female with PMHx of DM presenting today for follow up evaluation of an ulceration of the left foot. She states she is doing well and improving. She has been using Aquacel Ag daily with a bandage as directed. There are no worsening factors noted. Patient is here for further evaluation and treatment.   Past Medical History:  Diagnosis Date  . Allergy   . Chicken pox   . CVA (cerebral vascular accident) ([place])   . Diabetes mellitus ([place])   . Hypercholesterolemia   . Hypertension       Objective/Physical Exam General: The patient is alert and oriented x3 in no acute distress.  Dermatology:  Wound #1 noted to the sub-first MPJ of the left foot measuring 0.1 x 0.1 x 0.1 cm (LxWxD).     To the noted ulceration(s), there is no eschar. There is a moderate amount of slough, fibrin, and necrotic tissue noted. Granulation tissue and wound base is red. There is a minimal amount of serosanguineous drainage noted. There is no exposed bone muscle-tendon ligament or joint. There is no malodor. Periwound integrity is intact. Skin is warm, dry and supple bilateral lower extremities.   Vascular: Palpable pedal pulses bilaterally. No edema or erythema noted. Capillary refill within normal limits.  Neurological: Epicritic and protective threshold diminished bilaterally.   Musculoskeletal Exam: Range of motion within normal limits to all pedal and ankle joints bilateral. Muscle strength 5/5 in all groups bilateral.   Assessment: #1 ulceration of the sub-first MPJ of the left foot secondary to diabetes mellitus #2 diabetes mellitus w/ peripheral neuropathy  Plan of Care:  #1 Patient was evaluated. #2 Medically necessary excisional debridement including subcutaneous tissue was performed using a tissue nipper and a chisel blade. Excisional debridement of all the necrotic nonviable tissue down to healthy bleeding viable tissue was performed with post-debridement measurements same  as pre-. #3 The wound was cleansed and dry sterile dressing applied. #4 Continue using Aquacel Ag daily with a bandage.  #5 Continue wearing DM shoes.  #6 Return to clinic in 5 weeks.     [name], DPM [facility]  Dr. [name], [address]                                        [place], [place] [address]                Office [phone]  Fax [phone]

## 2019-12-04 ENCOUNTER — Other Ambulatory Visit: Payer: Self-pay

## 2019-12-04 ENCOUNTER — Other Ambulatory Visit (INDEPENDENT_AMBULATORY_CARE_PROVIDER_SITE_OTHER): Payer: Medicare Other

## 2019-12-04 DIAGNOSIS — E1140 Type 2 diabetes mellitus with diabetic neuropathy, unspecified: Secondary | ICD-10-CM | POA: Diagnosis not present

## 2019-12-04 DIAGNOSIS — E7800 Pure hypercholesterolemia, unspecified: Secondary | ICD-10-CM

## 2019-12-04 LAB — BASIC METABOLIC PANEL
BUN: 17 mg/dL (ref 6–23)
CO2: 30 mEq/L (ref 19–32)
Calcium: 9.5 mg/dL (ref 8.4–10.5)
Chloride: 100 mEq/L (ref 96–112)
Creatinine, Ser: 0.65 mg/dL (ref 0.40–1.20)
GFR: 84.38 mL/min (ref >60.00)
Glucose, Bld: 187 mg/dL — ABNORMAL HIGH (ref 70–99)
Potassium: 4.1 mEq/L (ref 3.5–5.1)
Sodium: 137 mEq/L (ref 135–145)

## 2019-12-04 LAB — HEPATIC FUNCTION PANEL
ALT: 10 U/L (ref 0–35)
AST: 18 U/L (ref 0–37)
Albumin: 4 g/dL (ref 3.5–5.2)
Alkaline Phosphatase: 71 U/L (ref 39–117)
Bilirubin, Direct: 0.1 mg/dL (ref 0.0–0.3)
Total Bilirubin: 0.6 mg/dL (ref 0.2–1.2)
Total Protein: 7.3 g/dL (ref 6.0–8.3)

## 2019-12-04 LAB — LIPID PANEL
Cholesterol: 161 mg/dL (ref 0–200)
HDL: 34.9 mg/dL — ABNORMAL LOW (ref >39.00)
LDL Cholesterol: 89 mg/dL (ref 0–99)
NonHDL: 126.27
Total CHOL/HDL Ratio: 5
Triglycerides: 188 mg/dL — ABNORMAL HIGH (ref 0.0–149.0)
VLDL: 37.6 mg/dL (ref 0.0–40.0)

## 2019-12-04 LAB — HEMOGLOBIN A1C: Hgb A1c MFr Bld: 7.5 % — ABNORMAL HIGH (ref 4.6–6.5)

## 2019-12-06 ENCOUNTER — Other Ambulatory Visit: Payer: Self-pay

## 2019-12-06 ENCOUNTER — Encounter: Payer: Self-pay | Admitting: Internal Medicine

## 2019-12-06 ENCOUNTER — Telehealth (INDEPENDENT_AMBULATORY_CARE_PROVIDER_SITE_OTHER): Payer: Medicare Other | Admitting: Internal Medicine

## 2019-12-06 DIAGNOSIS — E11621 Type 2 diabetes mellitus with foot ulcer: Secondary | ICD-10-CM

## 2019-12-06 DIAGNOSIS — H9190 Unspecified hearing loss, unspecified ear: Secondary | ICD-10-CM

## 2019-12-06 DIAGNOSIS — E7800 Pure hypercholesterolemia, unspecified: Secondary | ICD-10-CM

## 2019-12-06 DIAGNOSIS — D696 Thrombocytopenia, unspecified: Secondary | ICD-10-CM | POA: Diagnosis not present

## 2019-12-06 DIAGNOSIS — R197 Diarrhea, unspecified: Secondary | ICD-10-CM | POA: Diagnosis not present

## 2019-12-06 DIAGNOSIS — L97509 Non-pressure chronic ulcer of other part of unspecified foot with unspecified severity: Secondary | ICD-10-CM | POA: Diagnosis not present

## 2019-12-06 DIAGNOSIS — L97529 Non-pressure chronic ulcer of other part of left foot with unspecified severity: Secondary | ICD-10-CM | POA: Diagnosis not present

## 2019-12-06 DIAGNOSIS — I10 Essential (primary) hypertension: Secondary | ICD-10-CM | POA: Diagnosis not present

## 2019-12-06 DIAGNOSIS — Z8673 Personal history of transient ischemic attack (TIA), and cerebral infarction without residual deficits: Secondary | ICD-10-CM | POA: Diagnosis not present

## 2019-12-06 DIAGNOSIS — F419 Anxiety disorder, unspecified: Secondary | ICD-10-CM

## 2019-12-06 NOTE — Progress Notes (Signed)
Patient ID: Angela Patterson, female   DOB: [birth year], 86 y.o.   MRN: [id number]   Virtual Visit via video Note  This visit type was conducted due to national recommendations for restrictions regarding the COVID-19 pandemic (e.g. social distancing).  This format is felt to be most appropriate for this patient at this time.  All issues noted in this document were discussed and addressed.  No physical exam was performed (except for noted visual exam findings with Video Visits).   I connected with Angela Patterson by a video enabled telemedicine application and verified that I am speaking with the correct person using two identifiers. Location patient: home Location provider: work Persons participating in the virtual visit: patient, provider and pts daughter [name]).  The limitations, risks, security and privacy concerns of performing an evaluation and management service by video and the availability of in person appointments have been discussed. It has also been discussed with the patient that there may be a patient responsible charge related to this service. The patient expressed understanding and agreed to proceed.   Reason for visit: scheduled follow up.    HPI: Scheduled follow up.  Scheduled to f/u regarding her diabetes, hypertension and anxiety.  On zoloft.  Discussed increasing dose.  Tolerating and doing well on the medication.  Taking metformin - 2 in the am and one in the pm.  Tolerating.  No increased diarrhea.  No chest pain or sob reported.  No abdominal pain.  No bowel change reported.  Has hearing aids.  Pts daughter had question about rechargeable hearing aids.  Discussed hearing evaluation through ENT.  Agreeable.  She is using her walker.  Overall stable.    ROS: See pertinent positives and negatives per HPI.  Past Medical History:  Diagnosis Date  . Allergy   . Chicken pox   . CVA (cerebral vascular accident) ([place])   . Diabetes mellitus ([place])   . Hypercholesterolemia   .  Hypertension     Past Surgical History:  Procedure Laterality Date  . ABDOMINAL HYSTERECTOMY  1971   excessive bleeding  . APPENDECTOMY  age 85  . BREAST LUMPECTOMY  1958   benign  . DILATION AND CURETTAGE OF UTERUS  1970    Family History  Problem Relation Age of Onset  . Liver cancer Father   . Stroke Mother   . Heart attack Brother   . Hypertension Daughter   . Hypertension Son   . Hypertension Daughter   . Cancer Grandchild        breast  . Diabetes Grandchild   . Breast cancer Neg Hx   . Colon cancer Neg Hx     SOCIAL HX: reviewed.    Current Outpatient Medications:  .  amLODipine (NORVASC) 5 MG tablet, TAKE 1 TABLET BY MOUTH  TWICE DAILY, Disp: 180 tablet, Rfl: 3 .  Cholecalciferol (VITAMIN D) 2000 units tablet, Take 2,000 Units by mouth daily. , Disp: , Rfl:  .  clopidogrel (PLAVIX) 75 MG tablet, TAKE 1 TABLET BY MOUTH  DAILY, Disp: 90 tablet, Rfl: 3 .  glucose blood test strip, USE TO CHECK BLOOD SUGAR  TWO TIMES DAILY. Dx E11.9, Disp: 200 each, Rfl: 1 .  hydrocortisone (PROCTOCORT) 1 % CREA, Apply thin layer around rectal opening 2 times per day to help hemorrhoids, Disp: 1 Tube, Rfl: 1 .  Lancet Devices (ONE TOUCH DELICA LANCING DEV) MISC, Use twice daily Dx: 250.00, Disp: 100 each, Rfl: 5 .  metFORMIN (GLUCOPHAGE-XR) 500 MG 24  hr tablet, Take 2 tablets (1,000 mg total) by mouth 2 (two) times daily., Disp: 360 tablet, Rfl: 3 .  OMEGA-3 FATTY ACIDS PO, Take 720 mg by mouth daily., Disp: , Rfl:  .  Probiotic Product (PROBIOTIC DAILY PO), Take 1 tablet by mouth daily., Disp: , Rfl:  .  sertraline (ZOLOFT) 25 MG tablet, TAKE 1 TO 2 TABLETS BY  MOUTH DAILY, Disp: 180 tablet, Rfl: 3 .  timolol (BETIMOL) 0.5 % ophthalmic solution, 1 drop 2 (two) times daily., Disp: , Rfl:   EXAM:  GENERAL: alert, oriented, appears well and in no acute distress  HEENT: atraumatic, conjunttiva clear, no obvious abnormalities on inspection of external nose and ears  NECK: normal  movements of the head and neck  LUNGS: on inspection no signs of respiratory distress, breathing rate appears normal, no obvious gross SOB, gasping or wheezing  CV: no obvious cyanosis  PSYCH/NEURO: pleasant and cooperative, no obvious depression or anxiety, speech and thought processing grossly intact  ASSESSMENT AND PLAN:  Discussed the following assessment and plan:  Type 2 diabetes mellitus ([place]) Follow met b and a1c.  Tolerating metformin.    Thrombocytopenia ([place]) Follow cbc.   Pure hypercholesterolemia Not on cholesterol medication.  Pravastatin caused cramps.  Has tried simvastatin.  Follow lipid panel and liver function tests.    Hypertension Blood pressure has been doing well.  Continue amlodipine.  Follow pressures.  Follow metabolic panel.    Hypercholesterolemia Follow lipid panel.    History of CVA (cerebrovascular accident) Maintained on plavix.  Stable.    Foot ulcer ([place]) Followed by podiatry.    Diarrhea Bowels improved.  Follow.    Anxiety Doing well on zoloft.  Can increase to 1-2 tablets q day.  Follow.   Change in hearing Has hearing aids.  Feels batteries not working.  Discussed hearing evaluation through [facility].  Follow.     Orders Placed This Encounter  Procedures  . Hemoglobin A1c    Standing Status:   Future    Standing Expiration Date:   12/17/2020  . Lipid panel    Standing Status:   Future    Standing Expiration Date:   12/17/2020  . TSH    Standing Status:   Future    Standing Expiration Date:   12/17/2020  . Hepatic function panel    Standing Status:   Future    Standing Expiration Date:   12/17/2020  . Basic metabolic panel    Standing Status:   Future    Standing Expiration Date:   12/17/2020  . Ambulatory referral to ENT    Referral Priority:   Routine    Referral Type:   Consultation    Referral Reason:   Specialty Services Required    Requested Specialty:   Otolaryngology    Number of Visits Requested:   1    No  orders of the defined types were placed in this encounter.    I discussed the assessment and treatment plan with the patient. The patient was provided an opportunity to ask questions and all were answered. The patient agreed with the plan and demonstrated an understanding of the instructions.   The patient was advised to call back or seek an in-person evaluation if the symptoms worsen or if the condition fails to improve as anticipated.    Einar Pheasant, MD

## 2019-12-11 ENCOUNTER — Telehealth: Payer: Self-pay | Admitting: Internal Medicine

## 2019-12-11 NOTE — Telephone Encounter (Signed)
[name] from [facility] called about a referral for Patient

## 2019-12-12 DIAGNOSIS — E11621 Type 2 diabetes mellitus with foot ulcer: Secondary | ICD-10-CM | POA: Diagnosis not present

## 2019-12-13 NOTE — Telephone Encounter (Signed)
Do you by chance have the number for [facility]?

## 2019-12-18 ENCOUNTER — Encounter: Payer: Self-pay | Admitting: Internal Medicine

## 2019-12-18 DIAGNOSIS — H9190 Unspecified hearing loss, unspecified ear: Secondary | ICD-10-CM | POA: Insufficient documentation

## 2019-12-18 NOTE — Assessment & Plan Note (Signed)
Doing well on zoloft.  Can increase to 1-2 tablets q day.  Follow.

## 2019-12-18 NOTE — Assessment & Plan Note (Signed)
Not on cholesterol medication.  Pravastatin caused cramps.  Has tried simvastatin.  Follow lipid panel and liver function tests.

## 2019-12-18 NOTE — Assessment & Plan Note (Signed)
Maintained on plavix.  Stable.

## 2019-12-18 NOTE — Assessment & Plan Note (Signed)
Follow lipid panel.   

## 2019-12-18 NOTE — Assessment & Plan Note (Signed)
Follow cbc.  

## 2019-12-18 NOTE — Assessment & Plan Note (Signed)
Followed by podiatry 

## 2019-12-18 NOTE — Assessment & Plan Note (Signed)
Blood pressure has been doing well.  Continue amlodipine.  Follow pressures.  Follow metabolic panel.  

## 2019-12-18 NOTE — Assessment & Plan Note (Signed)
Follow met b and a1c.  Tolerating metformin.

## 2019-12-18 NOTE — Assessment & Plan Note (Signed)
Bowels improved.  Follow.  

## 2019-12-18 NOTE — Assessment & Plan Note (Signed)
Has hearing aids.  Feels batteries not working.  Discussed hearing evaluation through [facility].  Follow.

## 2019-12-25 ENCOUNTER — Telehealth: Payer: Self-pay | Admitting: Internal Medicine

## 2019-12-25 ENCOUNTER — Ambulatory Visit (INDEPENDENT_AMBULATORY_CARE_PROVIDER_SITE_OTHER): Payer: Medicare Other | Admitting: Pharmacist

## 2019-12-25 DIAGNOSIS — E11621 Type 2 diabetes mellitus with foot ulcer: Secondary | ICD-10-CM | POA: Diagnosis not present

## 2019-12-25 DIAGNOSIS — F419 Anxiety disorder, unspecified: Secondary | ICD-10-CM

## 2019-12-25 DIAGNOSIS — L97509 Non-pressure chronic ulcer of other part of unspecified foot with unspecified severity: Secondary | ICD-10-CM

## 2019-12-25 DIAGNOSIS — I10 Essential (primary) hypertension: Secondary | ICD-10-CM | POA: Diagnosis not present

## 2019-12-25 NOTE — Patient Instructions (Addendum)
Visit Information  Goals Addressed            This Visit's Progress     Patient Stated   . COMPLETED: "We want her to stay healthy" (pt-stated)       CARE PLAN ENTRY (see longtitudinal plan of care for additional care plan information)  Current Barriers:  . Diabetes: controlled at relaxed goal of <6.7%; complicated by diabetic ulcer; most recent A1c 7.5% o Angela Patterson notes no falls recently. Notes her mom did complain of dizziness the day after increasing sertraline to 50 mg daily, but has since resolved . Current antihyperglycemic regimen: metformin XR 1000 mg QAM, 500 mg QPM o Max tolerated dose of metformin d/t GI upset . Denies hypoglycemia . Current blood sugar readings:  o ~160s, though she notes HH RN does check periodically as well . Cardiovascular risk reduction: o Current hypertensive regimen: amlodipine 5 mg BID; reports home readings 120s/60s, though no consistent concerns w/ dizziness/lightheadedness  o Current hyperlipidemia regimen: omega 3 fatty acids OTC  o Antiplatelet regimen: clopidogrel 75 mg daily  . Depression/anxiety: sertraline 50 mg daily - recently increased. Improved anxiety.  . Supplementation: vitamin D3 2000 units daily   Pharmacist Clinical Goal(s):  Marland Kitchen Over the next 90 days, patient will work with PharmD and primary care provider to address optimized glycemic regimen  Interventions: . Comprehensive medication review performed, medication list updated in electronic medical record . Inter-disciplinary care team collaboration (see longitudinal plan of care) . Reviewed relaxed A1c goal given age. Close f/u with wound care for ulcer. Continue metformin XR 1500 mg daily at this time . Discussed that a more relaxed BP goal could be appropriate if issues w/ dizziness, lightheadedness, falls risk d/t hypotensive symptoms becomes a problem. Encouraged continued monitoring at home . Praised for improvement in symptoms w/ sertraline increase.  . Denies any other  questions or concerns at this time.  Patient Self Care Activities:  . Patient will check blood glucose periodically, document, and provide at future appointments . Patient will take medications as prescribed . Patient will report any questions or concerns to provider   Please see past updates related to this goal by clicking on the "Past Updates" button in the selected goal           Patient verbalizes understanding of instructions provided today.   Plan:  - Angela Patterson declines any needs at this time. She has my contact information for future concerns. I would be happy to re-engage with this patient in the future if new needs arise.   Catie Darnelle Maffucci, PharmD, [place], CPP Clinical Pharmacist [place] [phone]

## 2019-12-25 NOTE — Telephone Encounter (Signed)
Per review of chart, there is a referral (that has been authorized) - to ENT for change in hearing.

## 2019-12-25 NOTE — Chronic Care Management (AMB) (Signed)
Chronic Care Management   Follow Up Note   12/25/2019 Name: Angela Patterson MRN: [id number] DOB: [birth year]  Referred by: Angela Pheasant, MD Reason for referral : Chronic Care Management (Medication Management)   Angela Patterson is a 86 y.o. year old female who is a primary care patient of Angela Pheasant, MD. The CCM team was consulted for assistance with chronic disease management and care coordination needs.    Contacted patient's daughter, Angela Patterson, for medication management review.   Review of patient status, including review of consultants reports, relevant laboratory and other test results, and collaboration with appropriate care team members and the patient's provider was performed as part of comprehensive patient evaluation and provision of chronic care management services.    SDOH (Social Determinants of Health) assessments performed: No See Care Plan activities for detailed interventions related to SDOH)  SDOH Interventions     Most Recent Value  SDOH Interventions  Stress Interventions  Other (Comment) [encouraged adherence]       Outpatient Encounter Medications as of 12/25/2019  Medication Sig Note  . amLODipine (NORVASC) 5 MG tablet TAKE 1 TABLET BY MOUTH  TWICE DAILY   . clopidogrel (PLAVIX) 75 MG tablet TAKE 1 TABLET BY MOUTH  DAILY   . glucose blood test strip USE TO CHECK BLOOD SUGAR  TWO TIMES DAILY. Dx E11.9   . hydrocortisone (PROCTOCORT) 1 % CREA Apply thin layer around rectal opening 2 times per day to help hemorrhoids   . Lancet Devices (ONE TOUCH DELICA LANCING DEV) MISC Use twice daily Dx: 250.00   . metFORMIN (GLUCOPHAGE-XR) 500 MG 24 hr tablet Take 2 tablets (1,000 mg total) by mouth 2 (two) times daily. 06/26/2019: 2 QAM, 1 QPM  . OMEGA-3 FATTY ACIDS PO Take 720 mg by mouth daily.   . sertraline (ZOLOFT) 25 MG tablet TAKE 1 TO 2 TABLETS BY  MOUTH DAILY 12/25/2019: Taking 2 tablets QPM  . timolol (BETIMOL) 0.5 % ophthalmic solution 1 drop 2 (two) times daily.     . [DISCONTINUED] Probiotic Product (PROBIOTIC DAILY PO) Take 1 tablet by mouth daily.   . Cholecalciferol (VITAMIN D) 2000 units tablet Take 2,000 Units by mouth daily.     No facility-administered encounter medications on file as of 12/25/2019.     Objective:   Goals Addressed            This Visit's Progress     Patient Stated   . COMPLETED: "We want her to stay healthy" (pt-stated)       CARE PLAN ENTRY (see longtitudinal plan of care for additional care plan information)  Current Barriers:  . Diabetes: controlled at relaxed goal of <0.8%; complicated by diabetic ulcer; most recent A1c 7.5% o Angela Patterson notes no falls recently. Notes her mom did complain of dizziness the day after increasing sertraline to 50 mg daily, but has since resolved . Current antihyperglycemic regimen: metformin XR 1000 mg QAM, 500 mg QPM o Max tolerated dose of metformin d/t GI upset . Denies hypoglycemia . Current blood sugar readings:  o ~160s, though she notes HH RN does check periodically as well . Cardiovascular risk reduction: o Current hypertensive regimen: amlodipine 5 mg BID; reports home readings 120s/60s, though no consistent concerns w/ dizziness/lightheadedness  o Current hyperlipidemia regimen: omega 3 fatty acids OTC  o Antiplatelet regimen: clopidogrel 75 mg daily  . Depression/anxiety: sertraline 50 mg daily - recently increased. Improved anxiety.  . Supplementation: vitamin D3 2000 units daily   Pharmacist Clinical Goal(s):  .  Over the next 90 days, patient will work with PharmD and primary care provider to address optimized glycemic regimen  Interventions: . Comprehensive medication review performed, medication list updated in electronic medical record . Inter-disciplinary care team collaboration (see longitudinal plan of care) . Reviewed relaxed A1c goal given age. Close f/u with wound care for ulcer. Continue metformin XR 1500 mg daily at this time . Discussed that a more relaxed  BP goal could be appropriate if issues w/ dizziness, lightheadedness, falls risk d/t hypotensive symptoms becomes a problem. Encouraged continued monitoring at home . Praised for improvement in symptoms w/ sertraline increase.  . Denies any other questions or concerns at this time.  Patient Self Care Activities:  . Patient will check blood glucose periodically, document, and provide at future appointments . Patient will take medications as prescribed . Patient will report any questions or concerns to provider   Please see past updates related to this goal by clicking on the "Past Updates" button in the selected goal            Plan:  - Angela Patterson declines any needs at this time. She has my contact information for future concerns. I would be happy to re-engage with this patient in the future if new needs arise.   Catie Darnelle Maffucci, PharmD, [place], CPP Clinical Pharmacist [place] [phone]

## 2019-12-25 NOTE — Telephone Encounter (Signed)
Filomena Jungling from [facility] called about pt referral for a earring aide

## 2019-12-25 NOTE — Telephone Encounter (Signed)
Home health called stating that patient needs referral to get hearing aids.

## 2019-12-25 NOTE — Telephone Encounter (Signed)
Noted  

## 2019-12-25 NOTE — Progress Notes (Signed)
I have reviewed the above note and agree. I was available to the pharmacist for consultation.  Keaghan Bowens, MD 

## 2019-12-26 ENCOUNTER — Other Ambulatory Visit: Payer: Self-pay

## 2019-12-26 ENCOUNTER — Ambulatory Visit (INDEPENDENT_AMBULATORY_CARE_PROVIDER_SITE_OTHER): Payer: Medicare Other | Admitting: Podiatry

## 2019-12-26 DIAGNOSIS — L97522 Non-pressure chronic ulcer of other part of left foot with fat layer exposed: Secondary | ICD-10-CM | POA: Diagnosis not present

## 2019-12-26 DIAGNOSIS — E0843 Diabetes mellitus due to underlying condition with diabetic autonomic (poly)neuropathy: Secondary | ICD-10-CM | POA: Diagnosis not present

## 2019-12-26 NOTE — Progress Notes (Signed)
   Subjective:  86 year old female with PMHx of DM presenting today for follow up evaluation of an ulceration of the left foot.  Patient presents with her daughter-in-law.  They both state they are doing very well.  She is walking in her diabetic shoes with the felt offloads to the first MTPJ.  They have noticed a slight recurrence of the callus to the area.  They have not been dressing it with anything as directed last visit otherwise no new complaints at this time  Past Medical History:  Diagnosis Date  . Allergy   . Chicken pox   . CVA (cerebral vascular accident) ([place])   . Diabetes mellitus ([place])   . Hypercholesterolemia   . Hypertension       Objective/Physical Exam General: The patient is alert and oriented x3 in no acute distress.  Dermatology:  Wound #1 noted to the sub-first MPJ of the left foot measuring 0.5 x 0.2 x 0.1 cm (LxWxD).     To the noted ulceration(s), there is no eschar. There is a moderate amount of slough, fibrin, and necrotic tissue noted. Granulation tissue and wound base is red. There is a minimal amount of serosanguineous drainage noted. There is no exposed bone muscle-tendon ligament or joint. There is no malodor. Periwound integrity is intact. Skin is warm, dry and supple bilateral lower extremities.   Vascular: Palpable pedal pulses bilaterally. No edema or erythema noted. Capillary refill within normal limits.  Neurological: Epicritic and protective threshold diminished bilaterally.   Musculoskeletal Exam: Range of motion within normal limits to all pedal and ankle joints bilateral. Muscle strength 5/5 in all groups bilateral.   Assessment: #1 ulceration of the sub-first MPJ of the left foot secondary to diabetes mellitus #2 diabetes mellitus w/ peripheral neuropathy  Plan of Care:  #1 Patient was evaluated. #2 Medically necessary excisional debridement including subcutaneous tissue was performed using a tissue nipper and a chisel blade. Excisional  debridement of all the necrotic nonviable tissue down to healthy bleeding viable tissue was performed with post-debridement measurements same as pre-. #3 The wound was cleansed and dry sterile dressing applied. #4 Continue using Aquacel Ag daily with a bandage.  #5 Continue wearing DM shoes with felt offloading dancers pads.  #6 Return to clinic in 5 weeks.     [name], DPM [facility]  Dr. [name], [address]                                        [place], [place] [address]                Office [phone]  Fax [phone]

## 2020-01-11 DIAGNOSIS — I10 Essential (primary) hypertension: Secondary | ICD-10-CM | POA: Diagnosis not present

## 2020-01-11 DIAGNOSIS — R531 Weakness: Secondary | ICD-10-CM | POA: Diagnosis not present

## 2020-01-11 DIAGNOSIS — E11621 Type 2 diabetes mellitus with foot ulcer: Secondary | ICD-10-CM | POA: Diagnosis not present

## 2020-01-11 DIAGNOSIS — Z7984 Long term (current) use of oral hypoglycemic drugs: Secondary | ICD-10-CM | POA: Diagnosis not present

## 2020-01-11 DIAGNOSIS — F419 Anxiety disorder, unspecified: Secondary | ICD-10-CM | POA: Diagnosis not present

## 2020-01-11 DIAGNOSIS — E1369 Other specified diabetes mellitus with other specified complication: Secondary | ICD-10-CM | POA: Diagnosis not present

## 2020-01-11 DIAGNOSIS — Z8673 Personal history of transient ischemic attack (TIA), and cerebral infarction without residual deficits: Secondary | ICD-10-CM | POA: Diagnosis not present

## 2020-01-11 DIAGNOSIS — E1140 Type 2 diabetes mellitus with diabetic neuropathy, unspecified: Secondary | ICD-10-CM | POA: Diagnosis not present

## 2020-01-11 DIAGNOSIS — E11622 Type 2 diabetes mellitus with other skin ulcer: Secondary | ICD-10-CM | POA: Diagnosis not present

## 2020-01-11 DIAGNOSIS — R1312 Dysphagia, oropharyngeal phase: Secondary | ICD-10-CM | POA: Diagnosis not present

## 2020-01-15 DIAGNOSIS — E11621 Type 2 diabetes mellitus with foot ulcer: Secondary | ICD-10-CM | POA: Diagnosis not present

## 2020-01-15 DIAGNOSIS — F419 Anxiety disorder, unspecified: Secondary | ICD-10-CM | POA: Diagnosis not present

## 2020-01-15 DIAGNOSIS — Z7984 Long term (current) use of oral hypoglycemic drugs: Secondary | ICD-10-CM | POA: Diagnosis not present

## 2020-01-15 DIAGNOSIS — Z8673 Personal history of transient ischemic attack (TIA), and cerebral infarction without residual deficits: Secondary | ICD-10-CM | POA: Diagnosis not present

## 2020-01-15 DIAGNOSIS — I10 Essential (primary) hypertension: Secondary | ICD-10-CM | POA: Diagnosis not present

## 2020-01-15 DIAGNOSIS — E1140 Type 2 diabetes mellitus with diabetic neuropathy, unspecified: Secondary | ICD-10-CM | POA: Diagnosis not present

## 2020-01-23 DIAGNOSIS — F419 Anxiety disorder, unspecified: Secondary | ICD-10-CM | POA: Diagnosis not present

## 2020-01-23 DIAGNOSIS — E11621 Type 2 diabetes mellitus with foot ulcer: Secondary | ICD-10-CM | POA: Diagnosis not present

## 2020-01-23 DIAGNOSIS — Z7984 Long term (current) use of oral hypoglycemic drugs: Secondary | ICD-10-CM | POA: Diagnosis not present

## 2020-01-23 DIAGNOSIS — I10 Essential (primary) hypertension: Secondary | ICD-10-CM | POA: Diagnosis not present

## 2020-01-23 DIAGNOSIS — E1140 Type 2 diabetes mellitus with diabetic neuropathy, unspecified: Secondary | ICD-10-CM | POA: Diagnosis not present

## 2020-01-23 DIAGNOSIS — Z8673 Personal history of transient ischemic attack (TIA), and cerebral infarction without residual deficits: Secondary | ICD-10-CM | POA: Diagnosis not present

## 2020-01-29 DIAGNOSIS — E1140 Type 2 diabetes mellitus with diabetic neuropathy, unspecified: Secondary | ICD-10-CM | POA: Diagnosis not present

## 2020-01-29 DIAGNOSIS — Z7984 Long term (current) use of oral hypoglycemic drugs: Secondary | ICD-10-CM | POA: Diagnosis not present

## 2020-01-29 DIAGNOSIS — E11621 Type 2 diabetes mellitus with foot ulcer: Secondary | ICD-10-CM | POA: Diagnosis not present

## 2020-01-29 DIAGNOSIS — Z8673 Personal history of transient ischemic attack (TIA), and cerebral infarction without residual deficits: Secondary | ICD-10-CM | POA: Diagnosis not present

## 2020-01-29 DIAGNOSIS — F419 Anxiety disorder, unspecified: Secondary | ICD-10-CM | POA: Diagnosis not present

## 2020-01-29 DIAGNOSIS — I10 Essential (primary) hypertension: Secondary | ICD-10-CM | POA: Diagnosis not present

## 2020-01-30 ENCOUNTER — Ambulatory Visit (INDEPENDENT_AMBULATORY_CARE_PROVIDER_SITE_OTHER): Payer: Medicare Other | Admitting: Podiatry

## 2020-01-30 ENCOUNTER — Other Ambulatory Visit: Payer: Self-pay

## 2020-01-30 DIAGNOSIS — I70245 Atherosclerosis of native arteries of left leg with ulceration of other part of foot: Secondary | ICD-10-CM

## 2020-01-30 DIAGNOSIS — M79676 Pain in unspecified toe(s): Secondary | ICD-10-CM

## 2020-01-30 DIAGNOSIS — L97522 Non-pressure chronic ulcer of other part of left foot with fat layer exposed: Secondary | ICD-10-CM | POA: Diagnosis not present

## 2020-01-30 DIAGNOSIS — B351 Tinea unguium: Secondary | ICD-10-CM

## 2020-01-30 NOTE — Progress Notes (Signed)
   Subjective:  86 year old female with PMHx of DM presenting today for follow up evaluation of an ulceration of the left foot.  Patient presents with her daughter-in-law.  They both state they are doing very well.  She is walking in her diabetic shoes with the felt offloads to the first MTPJ.  They have not dressed the foot with anything.  She has noticed recently that the callus has had some discoloration and bleeding deep into the skin.  Past Medical History:  Diagnosis Date  . Allergy   . Chicken pox   . CVA (cerebral vascular accident) ([place])   . Diabetes mellitus ([place])   . Hypercholesterolemia   . Hypertension       Objective/Physical Exam General: The patient is alert and oriented x3 in no acute distress.  Dermatology:  Wound #1 noted to the sub-first MPJ of the left foot measuring 0.3x0.1 x 0.1 cm (LxWxD).     To the noted ulceration(s), there is no eschar. There is a moderate amount of slough, fibrin, and necrotic tissue noted. Granulation tissue and wound base is red. There is a minimal amount of serosanguineous drainage noted. There is no exposed bone muscle-tendon ligament or joint. There is no malodor. Periwound integrity is intact. Skin is warm, dry and supple bilateral lower extremities.   Hyperkeratotic elongated thickened nails noted 1-5 bilateral with sensitivity.  Vascular: Palpable pedal pulses bilaterally. No edema or erythema noted. Capillary refill within normal limits.  Neurological: Epicritic and protective threshold diminished bilaterally.   Musculoskeletal Exam: Range of motion within normal limits to all pedal and ankle joints bilateral. Muscle strength 5/5 in all groups bilateral.   Assessment: #1 ulceration of the sub-first MPJ of the left foot secondary to diabetes mellitus #2 diabetes mellitus w/ peripheral neuropathy #3 pain due to onychomycosis of toenail bilateral  Plan of Care:  #1 Patient was evaluated. #2 Medically necessary excisional  debridement including subcutaneous tissue was performed using a tissue nipper and a chisel blade. Excisional debridement of all the necrotic nonviable tissue down to healthy bleeding viable tissue was performed with post-debridement measurements same as pre-. #3 The wound was cleansed and dry sterile dressing applied. #4 Continue using Aquacel Ag daily with a bandage.  #5 Continue wearing DM shoes with felt offloading dancers pads.  #6  Mechanical debridement of nails 1-5 bilateral was performed using a nail nipper without incident or bleeding  #7 return to clinic in 5 weeks.     [name], DPM [facility]  Dr. [name], [place]                                        [place], [place] [address]                Office [phone]  Fax [phone]

## 2020-02-06 DIAGNOSIS — E11621 Type 2 diabetes mellitus with foot ulcer: Secondary | ICD-10-CM | POA: Diagnosis not present

## 2020-02-06 DIAGNOSIS — F419 Anxiety disorder, unspecified: Secondary | ICD-10-CM | POA: Diagnosis not present

## 2020-02-06 DIAGNOSIS — I10 Essential (primary) hypertension: Secondary | ICD-10-CM | POA: Diagnosis not present

## 2020-02-06 DIAGNOSIS — E1140 Type 2 diabetes mellitus with diabetic neuropathy, unspecified: Secondary | ICD-10-CM | POA: Diagnosis not present

## 2020-02-06 DIAGNOSIS — Z7984 Long term (current) use of oral hypoglycemic drugs: Secondary | ICD-10-CM | POA: Diagnosis not present

## 2020-02-06 DIAGNOSIS — Z8673 Personal history of transient ischemic attack (TIA), and cerebral infarction without residual deficits: Secondary | ICD-10-CM | POA: Diagnosis not present

## 2020-02-07 DIAGNOSIS — Z961 Presence of intraocular lens: Secondary | ICD-10-CM | POA: Diagnosis not present

## 2020-02-07 DIAGNOSIS — H04122 Dry eye syndrome of left lacrimal gland: Secondary | ICD-10-CM | POA: Diagnosis not present

## 2020-02-07 DIAGNOSIS — H40013 Open angle with borderline findings, low risk, bilateral: Secondary | ICD-10-CM | POA: Diagnosis not present

## 2020-02-07 DIAGNOSIS — E119 Type 2 diabetes mellitus without complications: Secondary | ICD-10-CM | POA: Diagnosis not present

## 2020-02-07 DIAGNOSIS — H35372 Puckering of macula, left eye: Secondary | ICD-10-CM | POA: Diagnosis not present

## 2020-02-10 DIAGNOSIS — E11621 Type 2 diabetes mellitus with foot ulcer: Secondary | ICD-10-CM | POA: Diagnosis not present

## 2020-02-10 DIAGNOSIS — I10 Essential (primary) hypertension: Secondary | ICD-10-CM | POA: Diagnosis not present

## 2020-02-10 DIAGNOSIS — E1140 Type 2 diabetes mellitus with diabetic neuropathy, unspecified: Secondary | ICD-10-CM | POA: Diagnosis not present

## 2020-02-10 DIAGNOSIS — E1369 Other specified diabetes mellitus with other specified complication: Secondary | ICD-10-CM | POA: Diagnosis not present

## 2020-02-10 DIAGNOSIS — E11622 Type 2 diabetes mellitus with other skin ulcer: Secondary | ICD-10-CM | POA: Diagnosis not present

## 2020-02-10 DIAGNOSIS — R531 Weakness: Secondary | ICD-10-CM | POA: Diagnosis not present

## 2020-02-10 DIAGNOSIS — F419 Anxiety disorder, unspecified: Secondary | ICD-10-CM | POA: Diagnosis not present

## 2020-02-10 DIAGNOSIS — Z7984 Long term (current) use of oral hypoglycemic drugs: Secondary | ICD-10-CM | POA: Diagnosis not present

## 2020-02-10 DIAGNOSIS — Z8673 Personal history of transient ischemic attack (TIA), and cerebral infarction without residual deficits: Secondary | ICD-10-CM | POA: Diagnosis not present

## 2020-02-10 DIAGNOSIS — R1312 Dysphagia, oropharyngeal phase: Secondary | ICD-10-CM | POA: Diagnosis not present

## 2020-02-12 DIAGNOSIS — F419 Anxiety disorder, unspecified: Secondary | ICD-10-CM | POA: Diagnosis not present

## 2020-02-12 DIAGNOSIS — E11621 Type 2 diabetes mellitus with foot ulcer: Secondary | ICD-10-CM | POA: Diagnosis not present

## 2020-02-12 DIAGNOSIS — E1140 Type 2 diabetes mellitus with diabetic neuropathy, unspecified: Secondary | ICD-10-CM | POA: Diagnosis not present

## 2020-02-12 DIAGNOSIS — I10 Essential (primary) hypertension: Secondary | ICD-10-CM | POA: Diagnosis not present

## 2020-02-12 DIAGNOSIS — Z7984 Long term (current) use of oral hypoglycemic drugs: Secondary | ICD-10-CM | POA: Diagnosis not present

## 2020-02-12 DIAGNOSIS — Z8673 Personal history of transient ischemic attack (TIA), and cerebral infarction without residual deficits: Secondary | ICD-10-CM | POA: Diagnosis not present

## 2020-02-19 DIAGNOSIS — I10 Essential (primary) hypertension: Secondary | ICD-10-CM | POA: Diagnosis not present

## 2020-02-19 DIAGNOSIS — E1140 Type 2 diabetes mellitus with diabetic neuropathy, unspecified: Secondary | ICD-10-CM | POA: Diagnosis not present

## 2020-02-19 DIAGNOSIS — E11621 Type 2 diabetes mellitus with foot ulcer: Secondary | ICD-10-CM | POA: Diagnosis not present

## 2020-02-19 DIAGNOSIS — F419 Anxiety disorder, unspecified: Secondary | ICD-10-CM | POA: Diagnosis not present

## 2020-02-19 DIAGNOSIS — Z8673 Personal history of transient ischemic attack (TIA), and cerebral infarction without residual deficits: Secondary | ICD-10-CM | POA: Diagnosis not present

## 2020-02-19 DIAGNOSIS — Z7984 Long term (current) use of oral hypoglycemic drugs: Secondary | ICD-10-CM | POA: Diagnosis not present

## 2020-02-26 DIAGNOSIS — Z8673 Personal history of transient ischemic attack (TIA), and cerebral infarction without residual deficits: Secondary | ICD-10-CM | POA: Diagnosis not present

## 2020-02-26 DIAGNOSIS — I10 Essential (primary) hypertension: Secondary | ICD-10-CM | POA: Diagnosis not present

## 2020-02-26 DIAGNOSIS — Z7984 Long term (current) use of oral hypoglycemic drugs: Secondary | ICD-10-CM | POA: Diagnosis not present

## 2020-02-26 DIAGNOSIS — F419 Anxiety disorder, unspecified: Secondary | ICD-10-CM | POA: Diagnosis not present

## 2020-02-26 DIAGNOSIS — E11621 Type 2 diabetes mellitus with foot ulcer: Secondary | ICD-10-CM | POA: Diagnosis not present

## 2020-02-26 DIAGNOSIS — E1140 Type 2 diabetes mellitus with diabetic neuropathy, unspecified: Secondary | ICD-10-CM | POA: Diagnosis not present

## 2020-03-04 DIAGNOSIS — Z8673 Personal history of transient ischemic attack (TIA), and cerebral infarction without residual deficits: Secondary | ICD-10-CM | POA: Diagnosis not present

## 2020-03-04 DIAGNOSIS — Z7984 Long term (current) use of oral hypoglycemic drugs: Secondary | ICD-10-CM | POA: Diagnosis not present

## 2020-03-04 DIAGNOSIS — E1140 Type 2 diabetes mellitus with diabetic neuropathy, unspecified: Secondary | ICD-10-CM | POA: Diagnosis not present

## 2020-03-04 DIAGNOSIS — I10 Essential (primary) hypertension: Secondary | ICD-10-CM | POA: Diagnosis not present

## 2020-03-04 DIAGNOSIS — E11621 Type 2 diabetes mellitus with foot ulcer: Secondary | ICD-10-CM | POA: Diagnosis not present

## 2020-03-04 DIAGNOSIS — F419 Anxiety disorder, unspecified: Secondary | ICD-10-CM | POA: Diagnosis not present

## 2020-03-05 ENCOUNTER — Ambulatory Visit (INDEPENDENT_AMBULATORY_CARE_PROVIDER_SITE_OTHER): Payer: Medicare Other | Admitting: Podiatry

## 2020-03-05 ENCOUNTER — Other Ambulatory Visit: Payer: Self-pay

## 2020-03-05 DIAGNOSIS — E0843 Diabetes mellitus due to underlying condition with diabetic autonomic (poly)neuropathy: Secondary | ICD-10-CM | POA: Diagnosis not present

## 2020-03-05 DIAGNOSIS — L97522 Non-pressure chronic ulcer of other part of left foot with fat layer exposed: Secondary | ICD-10-CM | POA: Diagnosis not present

## 2020-03-05 NOTE — Progress Notes (Signed)
   Subjective:  86 year old female with PMHx of DM presenting today for follow up evaluation of an ulceration of the left foot.  Patient presents with her daughter-in-law.  They both state they are doing very well.  She is walking in her diabetic shoes with the felt offloads to the first MTPJ.  They have not dressed the foot with anything.  She has noticed recently that the callus has had some discoloration and bleeding deep into the skin.  Past Medical History:  Diagnosis Date  . Allergy   . Chicken pox   . CVA (cerebral vascular accident) ([place])   . Diabetes mellitus ([place])   . Hypercholesterolemia   . Hypertension       Objective/Physical Exam General: The patient is alert and oriented x3 in no acute distress.  Dermatology:  Wound #1 noted to the sub-first MPJ of the left foot measuring 0.3x0.1 x 0.1 cm (LxWxD).     To the noted ulceration(s), there is no eschar. There is a moderate amount of slough, fibrin, and necrotic tissue noted. Granulation tissue and wound base is red. There is a minimal amount of serosanguineous drainage noted. There is no exposed bone muscle-tendon ligament or joint. There is no malodor. Periwound integrity is intact. Skin is warm, dry and supple bilateral lower extremities.   Hyperkeratotic elongated thickened nails noted 1-5 bilateral with sensitivity.  Vascular: Palpable pedal pulses bilaterally. No edema or erythema noted. Capillary refill within normal limits.  Neurological: Epicritic and protective threshold diminished bilaterally.   Musculoskeletal Exam: Range of motion within normal limits to all pedal and ankle joints bilateral. Muscle strength 5/5 in all groups bilateral.   Assessment: #1 ulceration of the sub-first MPJ of the left foot secondary to diabetes mellitus #2 diabetes mellitus w/ peripheral neuropathy #3 pain due to onychomycosis of toenail bilateral  Plan of Care:  #1 Patient was evaluated. #2 Medically necessary excisional  debridement including subcutaneous tissue was performed using a tissue nipper and a chisel blade. Excisional debridement of all the necrotic nonviable tissue down to healthy bleeding viable tissue was performed with post-debridement measurements same as pre-. #3 The wound was cleansed and dry sterile dressing applied. #4 Continue using Aquacel Ag daily with a bandage.  #5 Continue wearing DM shoes with felt offloading dancers pads.  #6  return to clinic in 6 weeks.     [name], DPM [facility]  Dr. [name], [place]                                        [place], [place] [address]                Office [phone]  Fax [phone]

## 2020-03-08 DIAGNOSIS — E11621 Type 2 diabetes mellitus with foot ulcer: Secondary | ICD-10-CM | POA: Diagnosis not present

## 2020-03-08 DIAGNOSIS — F419 Anxiety disorder, unspecified: Secondary | ICD-10-CM | POA: Diagnosis not present

## 2020-03-08 DIAGNOSIS — I10 Essential (primary) hypertension: Secondary | ICD-10-CM | POA: Diagnosis not present

## 2020-03-08 DIAGNOSIS — Z7984 Long term (current) use of oral hypoglycemic drugs: Secondary | ICD-10-CM | POA: Diagnosis not present

## 2020-03-08 DIAGNOSIS — E1140 Type 2 diabetes mellitus with diabetic neuropathy, unspecified: Secondary | ICD-10-CM | POA: Diagnosis not present

## 2020-03-08 DIAGNOSIS — Z8673 Personal history of transient ischemic attack (TIA), and cerebral infarction without residual deficits: Secondary | ICD-10-CM | POA: Diagnosis not present

## 2020-03-11 DIAGNOSIS — R531 Weakness: Secondary | ICD-10-CM | POA: Diagnosis not present

## 2020-03-11 DIAGNOSIS — Z741 Need for assistance with personal care: Secondary | ICD-10-CM | POA: Diagnosis not present

## 2020-03-11 DIAGNOSIS — E11621 Type 2 diabetes mellitus with foot ulcer: Secondary | ICD-10-CM | POA: Diagnosis not present

## 2020-03-11 DIAGNOSIS — R1312 Dysphagia, oropharyngeal phase: Secondary | ICD-10-CM | POA: Diagnosis not present

## 2020-03-11 DIAGNOSIS — F419 Anxiety disorder, unspecified: Secondary | ICD-10-CM | POA: Diagnosis not present

## 2020-03-11 DIAGNOSIS — S80812D Abrasion, left lower leg, subsequent encounter: Secondary | ICD-10-CM | POA: Diagnosis not present

## 2020-03-11 DIAGNOSIS — Z8673 Personal history of transient ischemic attack (TIA), and cerebral infarction without residual deficits: Secondary | ICD-10-CM | POA: Diagnosis not present

## 2020-03-11 DIAGNOSIS — I10 Essential (primary) hypertension: Secondary | ICD-10-CM | POA: Diagnosis not present

## 2020-03-11 DIAGNOSIS — E1140 Type 2 diabetes mellitus with diabetic neuropathy, unspecified: Secondary | ICD-10-CM | POA: Diagnosis not present

## 2020-03-11 DIAGNOSIS — R2681 Unsteadiness on feet: Secondary | ICD-10-CM | POA: Diagnosis not present

## 2020-03-11 DIAGNOSIS — Z7984 Long term (current) use of oral hypoglycemic drugs: Secondary | ICD-10-CM | POA: Diagnosis not present

## 2020-03-11 DIAGNOSIS — E1369 Other specified diabetes mellitus with other specified complication: Secondary | ICD-10-CM | POA: Diagnosis not present

## 2020-03-15 ENCOUNTER — Telehealth: Payer: Self-pay | Admitting: Internal Medicine

## 2020-03-15 DIAGNOSIS — F419 Anxiety disorder, unspecified: Secondary | ICD-10-CM | POA: Diagnosis not present

## 2020-03-15 DIAGNOSIS — E1140 Type 2 diabetes mellitus with diabetic neuropathy, unspecified: Secondary | ICD-10-CM | POA: Diagnosis not present

## 2020-03-15 DIAGNOSIS — E1369 Other specified diabetes mellitus with other specified complication: Secondary | ICD-10-CM | POA: Diagnosis not present

## 2020-03-15 DIAGNOSIS — R531 Weakness: Secondary | ICD-10-CM | POA: Diagnosis not present

## 2020-03-15 DIAGNOSIS — R1312 Dysphagia, oropharyngeal phase: Secondary | ICD-10-CM | POA: Diagnosis not present

## 2020-03-15 DIAGNOSIS — I10 Essential (primary) hypertension: Secondary | ICD-10-CM | POA: Diagnosis not present

## 2020-03-19 DIAGNOSIS — R1312 Dysphagia, oropharyngeal phase: Secondary | ICD-10-CM | POA: Diagnosis not present

## 2020-03-19 DIAGNOSIS — I10 Essential (primary) hypertension: Secondary | ICD-10-CM | POA: Diagnosis not present

## 2020-03-19 DIAGNOSIS — F419 Anxiety disorder, unspecified: Secondary | ICD-10-CM | POA: Diagnosis not present

## 2020-03-19 DIAGNOSIS — E1369 Other specified diabetes mellitus with other specified complication: Secondary | ICD-10-CM | POA: Diagnosis not present

## 2020-03-19 DIAGNOSIS — R531 Weakness: Secondary | ICD-10-CM | POA: Diagnosis not present

## 2020-03-19 DIAGNOSIS — E1140 Type 2 diabetes mellitus with diabetic neuropathy, unspecified: Secondary | ICD-10-CM | POA: Diagnosis not present

## 2020-03-20 DIAGNOSIS — E1140 Type 2 diabetes mellitus with diabetic neuropathy, unspecified: Secondary | ICD-10-CM | POA: Diagnosis not present

## 2020-03-20 DIAGNOSIS — R531 Weakness: Secondary | ICD-10-CM | POA: Diagnosis not present

## 2020-03-20 DIAGNOSIS — I10 Essential (primary) hypertension: Secondary | ICD-10-CM | POA: Diagnosis not present

## 2020-03-20 DIAGNOSIS — R1312 Dysphagia, oropharyngeal phase: Secondary | ICD-10-CM | POA: Diagnosis not present

## 2020-03-20 DIAGNOSIS — E1369 Other specified diabetes mellitus with other specified complication: Secondary | ICD-10-CM | POA: Diagnosis not present

## 2020-03-20 DIAGNOSIS — F419 Anxiety disorder, unspecified: Secondary | ICD-10-CM | POA: Diagnosis not present

## 2020-03-22 DIAGNOSIS — F419 Anxiety disorder, unspecified: Secondary | ICD-10-CM | POA: Diagnosis not present

## 2020-03-22 DIAGNOSIS — R531 Weakness: Secondary | ICD-10-CM | POA: Diagnosis not present

## 2020-03-22 DIAGNOSIS — E1140 Type 2 diabetes mellitus with diabetic neuropathy, unspecified: Secondary | ICD-10-CM | POA: Diagnosis not present

## 2020-03-22 DIAGNOSIS — I10 Essential (primary) hypertension: Secondary | ICD-10-CM | POA: Diagnosis not present

## 2020-03-22 DIAGNOSIS — R1312 Dysphagia, oropharyngeal phase: Secondary | ICD-10-CM | POA: Diagnosis not present

## 2020-03-22 DIAGNOSIS — E1369 Other specified diabetes mellitus with other specified complication: Secondary | ICD-10-CM | POA: Diagnosis not present

## 2020-03-28 DIAGNOSIS — F419 Anxiety disorder, unspecified: Secondary | ICD-10-CM | POA: Diagnosis not present

## 2020-03-28 DIAGNOSIS — R531 Weakness: Secondary | ICD-10-CM | POA: Diagnosis not present

## 2020-03-28 DIAGNOSIS — R1312 Dysphagia, oropharyngeal phase: Secondary | ICD-10-CM | POA: Diagnosis not present

## 2020-03-28 DIAGNOSIS — E1369 Other specified diabetes mellitus with other specified complication: Secondary | ICD-10-CM | POA: Diagnosis not present

## 2020-03-28 DIAGNOSIS — I10 Essential (primary) hypertension: Secondary | ICD-10-CM | POA: Diagnosis not present

## 2020-03-28 DIAGNOSIS — E1140 Type 2 diabetes mellitus with diabetic neuropathy, unspecified: Secondary | ICD-10-CM | POA: Diagnosis not present

## 2020-03-29 DIAGNOSIS — E1369 Other specified diabetes mellitus with other specified complication: Secondary | ICD-10-CM | POA: Diagnosis not present

## 2020-03-29 DIAGNOSIS — R531 Weakness: Secondary | ICD-10-CM | POA: Diagnosis not present

## 2020-03-29 DIAGNOSIS — E1140 Type 2 diabetes mellitus with diabetic neuropathy, unspecified: Secondary | ICD-10-CM | POA: Diagnosis not present

## 2020-03-29 DIAGNOSIS — R1312 Dysphagia, oropharyngeal phase: Secondary | ICD-10-CM | POA: Diagnosis not present

## 2020-03-29 DIAGNOSIS — I10 Essential (primary) hypertension: Secondary | ICD-10-CM | POA: Diagnosis not present

## 2020-03-29 DIAGNOSIS — F419 Anxiety disorder, unspecified: Secondary | ICD-10-CM | POA: Diagnosis not present

## 2020-04-03 DIAGNOSIS — E1369 Other specified diabetes mellitus with other specified complication: Secondary | ICD-10-CM | POA: Diagnosis not present

## 2020-04-03 DIAGNOSIS — R531 Weakness: Secondary | ICD-10-CM | POA: Diagnosis not present

## 2020-04-03 DIAGNOSIS — I10 Essential (primary) hypertension: Secondary | ICD-10-CM | POA: Diagnosis not present

## 2020-04-03 DIAGNOSIS — R1312 Dysphagia, oropharyngeal phase: Secondary | ICD-10-CM | POA: Diagnosis not present

## 2020-04-03 DIAGNOSIS — E1140 Type 2 diabetes mellitus with diabetic neuropathy, unspecified: Secondary | ICD-10-CM | POA: Diagnosis not present

## 2020-04-03 DIAGNOSIS — F419 Anxiety disorder, unspecified: Secondary | ICD-10-CM | POA: Diagnosis not present

## 2020-04-04 ENCOUNTER — Telehealth: Payer: Self-pay

## 2020-04-04 DIAGNOSIS — R4189 Other symptoms and signs involving cognitive functions and awareness: Secondary | ICD-10-CM

## 2020-04-04 NOTE — Telephone Encounter (Signed)
Called Patients Granddaughter Angela Patterson to clarify her message about Angela Patterson receiving a neurological evaluation.  Angela Patterson received a visit from a Angela Patterson, a Education officer, museum who noticed that Angela Patterson was having some difficulty comprehending verbal questions and was taking time to process before responding.She recommends that If Angela Patterson received a neurological evaluation done, she may qualify for more living assistance due to her current cognitive state.   Angela Patterson asks what the neurological evaluation entails and if Dr. Nicki Patterson knows the length of time or how in depth the test is.  Angela Patterson would like to avoid Stress or anything that would cause unnecessary anxiety on Angela Patterson due to age.  Angela Patterson requests a call back at [phone] and sees Dr. Nicki Patterson on 04/04/20 for a video visit or Angela Patterson, Angela Patterson. [place] Daughter at [phone].

## 2020-04-05 DIAGNOSIS — E1140 Type 2 diabetes mellitus with diabetic neuropathy, unspecified: Secondary | ICD-10-CM | POA: Diagnosis not present

## 2020-04-05 DIAGNOSIS — E1369 Other specified diabetes mellitus with other specified complication: Secondary | ICD-10-CM | POA: Diagnosis not present

## 2020-04-05 DIAGNOSIS — R1312 Dysphagia, oropharyngeal phase: Secondary | ICD-10-CM | POA: Diagnosis not present

## 2020-04-05 DIAGNOSIS — I10 Essential (primary) hypertension: Secondary | ICD-10-CM | POA: Diagnosis not present

## 2020-04-05 DIAGNOSIS — R531 Weakness: Secondary | ICD-10-CM | POA: Diagnosis not present

## 2020-04-05 DIAGNOSIS — F419 Anxiety disorder, unspecified: Secondary | ICD-10-CM | POA: Diagnosis not present

## 2020-04-05 NOTE — Addendum Note (Signed)
Addended by: Alisa Graff on: 04/05/2020 05:02 AM   Modules accepted: Orders

## 2020-04-05 NOTE — Telephone Encounter (Signed)
Spoke to [place] about the neurology referral.  Order placed for referral.

## 2020-04-08 DIAGNOSIS — E1369 Other specified diabetes mellitus with other specified complication: Secondary | ICD-10-CM | POA: Diagnosis not present

## 2020-04-08 DIAGNOSIS — E1140 Type 2 diabetes mellitus with diabetic neuropathy, unspecified: Secondary | ICD-10-CM | POA: Diagnosis not present

## 2020-04-08 DIAGNOSIS — I10 Essential (primary) hypertension: Secondary | ICD-10-CM | POA: Diagnosis not present

## 2020-04-08 DIAGNOSIS — R531 Weakness: Secondary | ICD-10-CM | POA: Diagnosis not present

## 2020-04-08 DIAGNOSIS — R1312 Dysphagia, oropharyngeal phase: Secondary | ICD-10-CM | POA: Diagnosis not present

## 2020-04-08 DIAGNOSIS — F419 Anxiety disorder, unspecified: Secondary | ICD-10-CM | POA: Diagnosis not present

## 2020-04-09 ENCOUNTER — Ambulatory Visit (INDEPENDENT_AMBULATORY_CARE_PROVIDER_SITE_OTHER): Payer: Medicare Other | Admitting: Podiatry

## 2020-04-09 ENCOUNTER — Other Ambulatory Visit: Payer: Self-pay | Admitting: Podiatry

## 2020-04-09 ENCOUNTER — Other Ambulatory Visit: Payer: Self-pay

## 2020-04-09 ENCOUNTER — Encounter: Payer: Self-pay | Admitting: Internal Medicine

## 2020-04-09 DIAGNOSIS — E0843 Diabetes mellitus due to underlying condition with diabetic autonomic (poly)neuropathy: Secondary | ICD-10-CM

## 2020-04-09 DIAGNOSIS — I70245 Atherosclerosis of native arteries of left leg with ulceration of other part of foot: Secondary | ICD-10-CM

## 2020-04-09 DIAGNOSIS — L97522 Non-pressure chronic ulcer of other part of left foot with fat layer exposed: Secondary | ICD-10-CM

## 2020-04-09 MED ORDER — DOXYCYCLINE HYCLATE 100 MG PO TABS: 100.0000 mg | ORAL_TABLET | Freq: Two times a day (BID) | ORAL | 0 refills | Status: DC

## 2020-04-09 NOTE — Progress Notes (Signed)
   Subjective:  86 year old female with PMHx of DM presenting today for follow up evaluation of an ulceration of the left foot.  [name] states that she was doing very well however over the last 2 days she developed a large blister to the ulceration site.  She became increasingly concerned and presents today to have it evaluated.  Past Medical History:  Diagnosis Date  . Allergy   . Chicken pox   . CVA (cerebral vascular accident) ([place])   . Diabetes mellitus ([place])   . Hypercholesterolemia   . Hypertension       Objective/Physical Exam General: The patient is alert and oriented x3 in no acute distress.  Dermatology:  Large hemorrhagic blister noted encompassing the first MTPJ of the left foot medially and plantar.  After deroofing of the hemorrhagic blister there is a underlying wound noted.  Wound #1 noted to the sub-first MPJ of the left foot measuring 5.0 x 5.0 x 0.3 (LxWxD).     To the noted ulceration(s), there is no eschar. There is a moderate amount of slough, fibrin, and necrotic tissue noted. Granulation tissue and wound base is red. There is a minimal amount of serosanguineous drainage noted. There is no exposed bone muscle-tendon ligament or joint. There is no malodor. Periwound integrity is intact. Skin is warm, dry and supple bilateral lower extremities.   Vascular: Palpable pedal pulses bilaterally.  Erythema noted localized around the left forefoot and first MTPJ. Capillary refill within normal limits.  Neurological: Epicritic and protective threshold diminished bilaterally.   Musculoskeletal Exam: Range of motion within normal limits to all pedal and ankle joints bilateral. Muscle strength 5/5 in all groups bilateral.   Assessment: #1 ulceration of the sub-first MPJ of the left foot secondary to diabetes mellitus #2 diabetes mellitus w/ peripheral neuropathy #3  Acute cellulitis/infection left foot  Plan of Care:  #1 Patient was evaluated. #2  Medically necessary excisional debridement including subcutaneous tissue was performed using a tissue nipper and a chisel blade. Excisional debridement of all the necrotic nonviable tissue down to healthy bleeding viable tissue was performed with post-debridement measurements same as pre-. #3 The wound was cleansed and dry sterile dressing applied. #4 Continue using Aquacel Ag daily with a bandage.  #5  Resume postoperative shoe #6 cultures were taken and sent to pathology for culture and sensitivity #7 prescription for doxycycline 100 mg 2 times daily #8 return to clinic in 1 week   [name], DPM [facility]  Dr. [name], [address]                                        [place],  [address]                Office [phone]  Fax [phone]

## 2020-04-10 DIAGNOSIS — S80812D Abrasion, left lower leg, subsequent encounter: Secondary | ICD-10-CM | POA: Diagnosis not present

## 2020-04-10 DIAGNOSIS — Z7984 Long term (current) use of oral hypoglycemic drugs: Secondary | ICD-10-CM | POA: Diagnosis not present

## 2020-04-10 DIAGNOSIS — F419 Anxiety disorder, unspecified: Secondary | ICD-10-CM | POA: Diagnosis not present

## 2020-04-10 DIAGNOSIS — E1140 Type 2 diabetes mellitus with diabetic neuropathy, unspecified: Secondary | ICD-10-CM | POA: Diagnosis not present

## 2020-04-10 DIAGNOSIS — Z741 Need for assistance with personal care: Secondary | ICD-10-CM | POA: Diagnosis not present

## 2020-04-10 DIAGNOSIS — I10 Essential (primary) hypertension: Secondary | ICD-10-CM | POA: Diagnosis not present

## 2020-04-10 DIAGNOSIS — Z8673 Personal history of transient ischemic attack (TIA), and cerebral infarction without residual deficits: Secondary | ICD-10-CM | POA: Diagnosis not present

## 2020-04-10 DIAGNOSIS — R2681 Unsteadiness on feet: Secondary | ICD-10-CM | POA: Diagnosis not present

## 2020-04-10 DIAGNOSIS — E1369 Other specified diabetes mellitus with other specified complication: Secondary | ICD-10-CM | POA: Diagnosis not present

## 2020-04-10 DIAGNOSIS — R531 Weakness: Secondary | ICD-10-CM | POA: Diagnosis not present

## 2020-04-10 DIAGNOSIS — R1312 Dysphagia, oropharyngeal phase: Secondary | ICD-10-CM | POA: Diagnosis not present

## 2020-04-11 DIAGNOSIS — I10 Essential (primary) hypertension: Secondary | ICD-10-CM | POA: Diagnosis not present

## 2020-04-11 DIAGNOSIS — E1140 Type 2 diabetes mellitus with diabetic neuropathy, unspecified: Secondary | ICD-10-CM | POA: Diagnosis not present

## 2020-04-11 DIAGNOSIS — E1369 Other specified diabetes mellitus with other specified complication: Secondary | ICD-10-CM | POA: Diagnosis not present

## 2020-04-11 DIAGNOSIS — R1312 Dysphagia, oropharyngeal phase: Secondary | ICD-10-CM | POA: Diagnosis not present

## 2020-04-11 DIAGNOSIS — R531 Weakness: Secondary | ICD-10-CM | POA: Diagnosis not present

## 2020-04-11 DIAGNOSIS — F419 Anxiety disorder, unspecified: Secondary | ICD-10-CM | POA: Diagnosis not present

## 2020-04-12 DIAGNOSIS — E1369 Other specified diabetes mellitus with other specified complication: Secondary | ICD-10-CM | POA: Diagnosis not present

## 2020-04-12 DIAGNOSIS — R531 Weakness: Secondary | ICD-10-CM | POA: Diagnosis not present

## 2020-04-12 DIAGNOSIS — I10 Essential (primary) hypertension: Secondary | ICD-10-CM | POA: Diagnosis not present

## 2020-04-12 DIAGNOSIS — F419 Anxiety disorder, unspecified: Secondary | ICD-10-CM | POA: Diagnosis not present

## 2020-04-12 DIAGNOSIS — R1312 Dysphagia, oropharyngeal phase: Secondary | ICD-10-CM | POA: Diagnosis not present

## 2020-04-12 DIAGNOSIS — E1140 Type 2 diabetes mellitus with diabetic neuropathy, unspecified: Secondary | ICD-10-CM | POA: Diagnosis not present

## 2020-04-15 LAB — WOUND CULTURE: Organism ID, Bacteria: NONE SEEN

## 2020-04-16 ENCOUNTER — Ambulatory Visit: Payer: Medicare Other | Admitting: Podiatry

## 2020-04-16 DIAGNOSIS — R1312 Dysphagia, oropharyngeal phase: Secondary | ICD-10-CM | POA: Diagnosis not present

## 2020-04-16 DIAGNOSIS — E1369 Other specified diabetes mellitus with other specified complication: Secondary | ICD-10-CM | POA: Diagnosis not present

## 2020-04-16 DIAGNOSIS — R531 Weakness: Secondary | ICD-10-CM | POA: Diagnosis not present

## 2020-04-16 DIAGNOSIS — F419 Anxiety disorder, unspecified: Secondary | ICD-10-CM | POA: Diagnosis not present

## 2020-04-16 DIAGNOSIS — I10 Essential (primary) hypertension: Secondary | ICD-10-CM | POA: Diagnosis not present

## 2020-04-16 DIAGNOSIS — E1140 Type 2 diabetes mellitus with diabetic neuropathy, unspecified: Secondary | ICD-10-CM | POA: Diagnosis not present

## 2020-04-17 DIAGNOSIS — R1312 Dysphagia, oropharyngeal phase: Secondary | ICD-10-CM | POA: Diagnosis not present

## 2020-04-17 DIAGNOSIS — E1140 Type 2 diabetes mellitus with diabetic neuropathy, unspecified: Secondary | ICD-10-CM | POA: Diagnosis not present

## 2020-04-17 DIAGNOSIS — F419 Anxiety disorder, unspecified: Secondary | ICD-10-CM | POA: Diagnosis not present

## 2020-04-17 DIAGNOSIS — R531 Weakness: Secondary | ICD-10-CM | POA: Diagnosis not present

## 2020-04-17 DIAGNOSIS — I10 Essential (primary) hypertension: Secondary | ICD-10-CM | POA: Diagnosis not present

## 2020-04-17 DIAGNOSIS — E1369 Other specified diabetes mellitus with other specified complication: Secondary | ICD-10-CM | POA: Diagnosis not present

## 2020-04-19 ENCOUNTER — Ambulatory Visit (INDEPENDENT_AMBULATORY_CARE_PROVIDER_SITE_OTHER): Payer: Medicare Other | Admitting: Podiatry

## 2020-04-19 ENCOUNTER — Encounter: Payer: Self-pay | Admitting: Podiatry

## 2020-04-19 ENCOUNTER — Other Ambulatory Visit: Payer: Self-pay

## 2020-04-19 DIAGNOSIS — E0843 Diabetes mellitus due to underlying condition with diabetic autonomic (poly)neuropathy: Secondary | ICD-10-CM

## 2020-04-19 DIAGNOSIS — E1369 Other specified diabetes mellitus with other specified complication: Secondary | ICD-10-CM | POA: Diagnosis not present

## 2020-04-19 DIAGNOSIS — Z7984 Long term (current) use of oral hypoglycemic drugs: Secondary | ICD-10-CM | POA: Diagnosis not present

## 2020-04-19 DIAGNOSIS — I70245 Atherosclerosis of native arteries of left leg with ulceration of other part of foot: Secondary | ICD-10-CM

## 2020-04-19 DIAGNOSIS — F419 Anxiety disorder, unspecified: Secondary | ICD-10-CM | POA: Diagnosis not present

## 2020-04-19 DIAGNOSIS — R1312 Dysphagia, oropharyngeal phase: Secondary | ICD-10-CM | POA: Diagnosis not present

## 2020-04-19 DIAGNOSIS — R531 Weakness: Secondary | ICD-10-CM | POA: Diagnosis not present

## 2020-04-19 DIAGNOSIS — R2681 Unsteadiness on feet: Secondary | ICD-10-CM | POA: Diagnosis not present

## 2020-04-19 DIAGNOSIS — L97522 Non-pressure chronic ulcer of other part of left foot with fat layer exposed: Secondary | ICD-10-CM

## 2020-04-19 DIAGNOSIS — I10 Essential (primary) hypertension: Secondary | ICD-10-CM | POA: Diagnosis not present

## 2020-04-19 DIAGNOSIS — Z741 Need for assistance with personal care: Secondary | ICD-10-CM | POA: Diagnosis not present

## 2020-04-19 DIAGNOSIS — S80812D Abrasion, left lower leg, subsequent encounter: Secondary | ICD-10-CM | POA: Diagnosis not present

## 2020-04-19 DIAGNOSIS — Z8673 Personal history of transient ischemic attack (TIA), and cerebral infarction without residual deficits: Secondary | ICD-10-CM | POA: Diagnosis not present

## 2020-04-19 DIAGNOSIS — E1140 Type 2 diabetes mellitus with diabetic neuropathy, unspecified: Secondary | ICD-10-CM | POA: Diagnosis not present

## 2020-04-19 NOTE — Progress Notes (Signed)
   Subjective:  86 year old female with PMHx of DM presenting today for follow up evaluation of an ulceration of the left foot.  Patient completed the oral antibiotics that were prescribed.  They have noticed significant improvement of the foot.  They present today for further treatment and evaluation  Past Medical History:  Diagnosis Date  . Allergy   . Chicken pox   . CVA (cerebral vascular accident) ([place])   . Diabetes mellitus ([place])   . Hypercholesterolemia   . Hypertension       Objective/Physical Exam General: The patient is alert and oriented x3 in no acute distress.  Dermatology:  Large hemorrhagic blister noted encompassing the first MTPJ of the left foot medially and plantar.  After deroofing of the hemorrhagic blister there is a underlying wound noted.  Wound #1 noted to the sub-first MPJ of the left foot measuring 0.5 x 0.5 x 0.2 (LxWxD).     To the noted ulceration(s), there is no eschar. There is a moderate amount of slough, fibrin, and necrotic tissue noted. Granulation tissue and wound base is red. There is a minimal amount of serosanguineous drainage noted. There is no exposed bone muscle-tendon ligament or joint. There is no malodor. Periwound integrity is intact. Skin is warm, dry and supple bilateral lower extremities.   Vascular: Palpable pedal pulses bilaterally.  Erythema resolved. Capillary refill within normal limits.  Neurological: Epicritic and protective threshold diminished bilaterally.   Musculoskeletal Exam: Range of motion within normal limits to all pedal and ankle joints bilateral. Muscle strength 5/5 in all groups bilateral.   Assessment: #1 ulceration of the sub-first MPJ of the left foot secondary to diabetes mellitus #2 diabetes mellitus w/ peripheral neuropathy #3  Acute cellulitis/infection left foot-resolved  Plan of Care:  #1 Patient was evaluated. #2 Medically necessary excisional debridement including subcutaneous tissue was performed  using a tissue nipper and a chisel blade. Excisional debridement of all the necrotic nonviable tissue down to healthy bleeding viable tissue was performed with post-debridement measurements same as pre-. #3 The wound was cleansed and dry sterile dressing applied. #4 Continue using Aquacel Ag daily with a bandage.  #5  Resume diabetic shoes and insoles #6  Return to clinic in 3 weeks   [name], DPM [facility]  Dr. [name], [address]                                        [place], [place] [address]                Office [phone]  Fax [phone]

## 2020-04-25 DIAGNOSIS — I10 Essential (primary) hypertension: Secondary | ICD-10-CM | POA: Diagnosis not present

## 2020-04-25 DIAGNOSIS — E1369 Other specified diabetes mellitus with other specified complication: Secondary | ICD-10-CM | POA: Diagnosis not present

## 2020-04-25 DIAGNOSIS — R531 Weakness: Secondary | ICD-10-CM | POA: Diagnosis not present

## 2020-04-25 DIAGNOSIS — E1140 Type 2 diabetes mellitus with diabetic neuropathy, unspecified: Secondary | ICD-10-CM | POA: Diagnosis not present

## 2020-04-25 DIAGNOSIS — R1312 Dysphagia, oropharyngeal phase: Secondary | ICD-10-CM | POA: Diagnosis not present

## 2020-04-25 DIAGNOSIS — F419 Anxiety disorder, unspecified: Secondary | ICD-10-CM | POA: Diagnosis not present

## 2020-04-26 DIAGNOSIS — R531 Weakness: Secondary | ICD-10-CM | POA: Diagnosis not present

## 2020-04-26 DIAGNOSIS — I10 Essential (primary) hypertension: Secondary | ICD-10-CM | POA: Diagnosis not present

## 2020-04-26 DIAGNOSIS — E1369 Other specified diabetes mellitus with other specified complication: Secondary | ICD-10-CM | POA: Diagnosis not present

## 2020-04-26 DIAGNOSIS — F419 Anxiety disorder, unspecified: Secondary | ICD-10-CM | POA: Diagnosis not present

## 2020-04-26 DIAGNOSIS — R1312 Dysphagia, oropharyngeal phase: Secondary | ICD-10-CM | POA: Diagnosis not present

## 2020-04-26 DIAGNOSIS — E1140 Type 2 diabetes mellitus with diabetic neuropathy, unspecified: Secondary | ICD-10-CM | POA: Diagnosis not present

## 2020-04-29 ENCOUNTER — Other Ambulatory Visit: Payer: Self-pay | Admitting: Internal Medicine

## 2020-04-29 DIAGNOSIS — E1140 Type 2 diabetes mellitus with diabetic neuropathy, unspecified: Secondary | ICD-10-CM

## 2020-04-30 DIAGNOSIS — F419 Anxiety disorder, unspecified: Secondary | ICD-10-CM | POA: Diagnosis not present

## 2020-04-30 DIAGNOSIS — R531 Weakness: Secondary | ICD-10-CM | POA: Diagnosis not present

## 2020-04-30 DIAGNOSIS — E1140 Type 2 diabetes mellitus with diabetic neuropathy, unspecified: Secondary | ICD-10-CM | POA: Diagnosis not present

## 2020-04-30 DIAGNOSIS — R1312 Dysphagia, oropharyngeal phase: Secondary | ICD-10-CM | POA: Diagnosis not present

## 2020-04-30 DIAGNOSIS — E1369 Other specified diabetes mellitus with other specified complication: Secondary | ICD-10-CM | POA: Diagnosis not present

## 2020-04-30 DIAGNOSIS — I10 Essential (primary) hypertension: Secondary | ICD-10-CM | POA: Diagnosis not present

## 2020-05-02 DIAGNOSIS — E1140 Type 2 diabetes mellitus with diabetic neuropathy, unspecified: Secondary | ICD-10-CM | POA: Diagnosis not present

## 2020-05-02 DIAGNOSIS — I10 Essential (primary) hypertension: Secondary | ICD-10-CM | POA: Diagnosis not present

## 2020-05-02 DIAGNOSIS — F419 Anxiety disorder, unspecified: Secondary | ICD-10-CM | POA: Diagnosis not present

## 2020-05-02 DIAGNOSIS — R531 Weakness: Secondary | ICD-10-CM | POA: Diagnosis not present

## 2020-05-02 DIAGNOSIS — E1369 Other specified diabetes mellitus with other specified complication: Secondary | ICD-10-CM | POA: Diagnosis not present

## 2020-05-02 DIAGNOSIS — R1312 Dysphagia, oropharyngeal phase: Secondary | ICD-10-CM | POA: Diagnosis not present

## 2020-05-03 DIAGNOSIS — I10 Essential (primary) hypertension: Secondary | ICD-10-CM | POA: Diagnosis not present

## 2020-05-03 DIAGNOSIS — E1140 Type 2 diabetes mellitus with diabetic neuropathy, unspecified: Secondary | ICD-10-CM | POA: Diagnosis not present

## 2020-05-03 DIAGNOSIS — F419 Anxiety disorder, unspecified: Secondary | ICD-10-CM | POA: Diagnosis not present

## 2020-05-03 DIAGNOSIS — E1369 Other specified diabetes mellitus with other specified complication: Secondary | ICD-10-CM | POA: Diagnosis not present

## 2020-05-03 DIAGNOSIS — R531 Weakness: Secondary | ICD-10-CM | POA: Diagnosis not present

## 2020-05-03 DIAGNOSIS — R1312 Dysphagia, oropharyngeal phase: Secondary | ICD-10-CM | POA: Diagnosis not present

## 2020-05-06 ENCOUNTER — Other Ambulatory Visit: Payer: Self-pay

## 2020-05-06 ENCOUNTER — Telehealth: Payer: Self-pay | Admitting: Internal Medicine

## 2020-05-06 MED ORDER — AMLODIPINE BESYLATE 5 MG PO TABS
5.0000 mg | ORAL_TABLET | Freq: Two times a day (BID) | ORAL | 3 refills | Status: DC
Start: 2020-05-06 — End: 2020-06-26

## 2020-05-06 NOTE — Telephone Encounter (Signed)
Pt needs a refill on amLODipine (NORVASC) 5 MG tablet sent to [facility]  Pt is out of medication

## 2020-05-06 NOTE — Telephone Encounter (Signed)
Amlodipine sent in

## 2020-05-07 DIAGNOSIS — E1140 Type 2 diabetes mellitus with diabetic neuropathy, unspecified: Secondary | ICD-10-CM | POA: Diagnosis not present

## 2020-05-07 DIAGNOSIS — F419 Anxiety disorder, unspecified: Secondary | ICD-10-CM | POA: Diagnosis not present

## 2020-05-07 DIAGNOSIS — R1312 Dysphagia, oropharyngeal phase: Secondary | ICD-10-CM | POA: Diagnosis not present

## 2020-05-07 DIAGNOSIS — R531 Weakness: Secondary | ICD-10-CM | POA: Diagnosis not present

## 2020-05-07 DIAGNOSIS — E1369 Other specified diabetes mellitus with other specified complication: Secondary | ICD-10-CM | POA: Diagnosis not present

## 2020-05-07 DIAGNOSIS — I10 Essential (primary) hypertension: Secondary | ICD-10-CM | POA: Diagnosis not present

## 2020-05-09 DIAGNOSIS — R531 Weakness: Secondary | ICD-10-CM | POA: Diagnosis not present

## 2020-05-09 DIAGNOSIS — F419 Anxiety disorder, unspecified: Secondary | ICD-10-CM | POA: Diagnosis not present

## 2020-05-09 DIAGNOSIS — I10 Essential (primary) hypertension: Secondary | ICD-10-CM | POA: Diagnosis not present

## 2020-05-09 DIAGNOSIS — E1369 Other specified diabetes mellitus with other specified complication: Secondary | ICD-10-CM | POA: Diagnosis not present

## 2020-05-09 DIAGNOSIS — E1140 Type 2 diabetes mellitus with diabetic neuropathy, unspecified: Secondary | ICD-10-CM | POA: Diagnosis not present

## 2020-05-09 DIAGNOSIS — R1312 Dysphagia, oropharyngeal phase: Secondary | ICD-10-CM | POA: Diagnosis not present

## 2020-05-10 ENCOUNTER — Other Ambulatory Visit: Payer: Self-pay

## 2020-05-10 ENCOUNTER — Ambulatory Visit (INDEPENDENT_AMBULATORY_CARE_PROVIDER_SITE_OTHER): Payer: Medicare Other | Admitting: Podiatry

## 2020-05-10 DIAGNOSIS — Z4801 Encounter for change or removal of surgical wound dressing: Secondary | ICD-10-CM | POA: Diagnosis not present

## 2020-05-10 DIAGNOSIS — S90822D Blister (nonthermal), left foot, subsequent encounter: Secondary | ICD-10-CM | POA: Diagnosis not present

## 2020-05-10 DIAGNOSIS — L97522 Non-pressure chronic ulcer of other part of left foot with fat layer exposed: Secondary | ICD-10-CM

## 2020-05-10 DIAGNOSIS — E0843 Diabetes mellitus due to underlying condition with diabetic autonomic (poly)neuropathy: Secondary | ICD-10-CM | POA: Diagnosis not present

## 2020-05-10 DIAGNOSIS — I10 Essential (primary) hypertension: Secondary | ICD-10-CM | POA: Diagnosis not present

## 2020-05-10 DIAGNOSIS — F419 Anxiety disorder, unspecified: Secondary | ICD-10-CM | POA: Diagnosis not present

## 2020-05-10 DIAGNOSIS — Z8673 Personal history of transient ischemic attack (TIA), and cerebral infarction without residual deficits: Secondary | ICD-10-CM | POA: Diagnosis not present

## 2020-05-10 DIAGNOSIS — E1140 Type 2 diabetes mellitus with diabetic neuropathy, unspecified: Secondary | ICD-10-CM | POA: Diagnosis not present

## 2020-05-10 DIAGNOSIS — Z7984 Long term (current) use of oral hypoglycemic drugs: Secondary | ICD-10-CM | POA: Diagnosis not present

## 2020-05-10 DIAGNOSIS — I70245 Atherosclerosis of native arteries of left leg with ulceration of other part of foot: Secondary | ICD-10-CM

## 2020-05-10 DIAGNOSIS — Z741 Need for assistance with personal care: Secondary | ICD-10-CM | POA: Diagnosis not present

## 2020-05-10 DIAGNOSIS — R531 Weakness: Secondary | ICD-10-CM | POA: Diagnosis not present

## 2020-05-10 NOTE — Progress Notes (Signed)
   Subjective:  86 year old female with PMHx of DM presenting today for follow up evaluation of an ulceration of the left foot. No new complaints at this time  Past Medical History:  Diagnosis Date  . Allergy   . Chicken pox   . CVA (cerebral vascular accident) ([place])   . Diabetes mellitus ([place])   . Hypercholesterolemia   . Hypertension       Objective/Physical Exam General: The patient is alert and oriented x3 in no acute distress.  Dermatology:  Large hemorrhagic blister noted encompassing the first MTPJ of the left foot medially and plantar.  After deroofing of the hemorrhagic blister there is a underlying wound noted.  Wound #1 noted to the sub-first MPJ of the left foot measuring 4.5x3.5x0.3(LxWxD). Today there is increased skin break down increasing the size of the wound.      To the noted ulceration(s), there is no eschar. There is a moderate amount of slough, fibrin, and necrotic tissue noted. Granulation tissue and wound base is red. There is a minimal amount of serosanguineous drainage noted. There is no exposed bone muscle-tendon ligament or joint. There is no malodor. Periwound integrity is intact. Skin is warm, dry and supple bilateral lower extremities.   Vascular: Palpable pedal pulses bilaterally.  Erythema resolved. Capillary refill within normal limits.  Neurological: Epicritic and protective threshold diminished bilaterally.   Musculoskeletal Exam: Range of motion within normal limits to all pedal and ankle joints bilateral. Muscle strength 5/5 in all groups bilateral.   Assessment: #1 ulceration of the sub-first MPJ of the left foot secondary to diabetes mellitus #2 diabetes mellitus w/ peripheral neuropathy #3  Acute cellulitis/infection left foot-resolved  Plan of Care:  #1 Patient was evaluated. #2 Medically necessary excisional debridement including subcutaneous tissue was performed using a tissue nipper and a chisel blade. Excisional debridement of all  the necrotic nonviable tissue down to healthy bleeding viable tissue was performed with post-debridement measurements same as pre-. #3 The wound was cleansed and dry sterile dressing applied. #4 Continue using Aquacel Ag daily with a bandage.  #5  Resume diabetic shoes and insoles #6  Return to clinic in 3 weeks  *Having a grandbaby/great-grandbaby any day now   [name], DPM [facility]  Dr. [name], [address]                                        [place], [place] [address]                Office [phone]  Fax [phone]

## 2020-05-17 DIAGNOSIS — I10 Essential (primary) hypertension: Secondary | ICD-10-CM | POA: Diagnosis not present

## 2020-05-17 DIAGNOSIS — E1140 Type 2 diabetes mellitus with diabetic neuropathy, unspecified: Secondary | ICD-10-CM | POA: Diagnosis not present

## 2020-05-17 DIAGNOSIS — F419 Anxiety disorder, unspecified: Secondary | ICD-10-CM | POA: Diagnosis not present

## 2020-05-17 DIAGNOSIS — Z7984 Long term (current) use of oral hypoglycemic drugs: Secondary | ICD-10-CM | POA: Diagnosis not present

## 2020-05-17 DIAGNOSIS — S90822D Blister (nonthermal), left foot, subsequent encounter: Secondary | ICD-10-CM | POA: Diagnosis not present

## 2020-05-17 DIAGNOSIS — Z4801 Encounter for change or removal of surgical wound dressing: Secondary | ICD-10-CM | POA: Diagnosis not present

## 2020-05-21 DIAGNOSIS — S90822D Blister (nonthermal), left foot, subsequent encounter: Secondary | ICD-10-CM | POA: Diagnosis not present

## 2020-05-21 DIAGNOSIS — I10 Essential (primary) hypertension: Secondary | ICD-10-CM | POA: Diagnosis not present

## 2020-05-21 DIAGNOSIS — F419 Anxiety disorder, unspecified: Secondary | ICD-10-CM | POA: Diagnosis not present

## 2020-05-21 DIAGNOSIS — Z7984 Long term (current) use of oral hypoglycemic drugs: Secondary | ICD-10-CM | POA: Diagnosis not present

## 2020-05-21 DIAGNOSIS — E1140 Type 2 diabetes mellitus with diabetic neuropathy, unspecified: Secondary | ICD-10-CM | POA: Diagnosis not present

## 2020-05-21 DIAGNOSIS — Z4801 Encounter for change or removal of surgical wound dressing: Secondary | ICD-10-CM | POA: Diagnosis not present

## 2020-05-24 DIAGNOSIS — S90822D Blister (nonthermal), left foot, subsequent encounter: Secondary | ICD-10-CM | POA: Diagnosis not present

## 2020-05-24 DIAGNOSIS — F419 Anxiety disorder, unspecified: Secondary | ICD-10-CM | POA: Diagnosis not present

## 2020-05-24 DIAGNOSIS — Z7984 Long term (current) use of oral hypoglycemic drugs: Secondary | ICD-10-CM | POA: Diagnosis not present

## 2020-05-24 DIAGNOSIS — E1140 Type 2 diabetes mellitus with diabetic neuropathy, unspecified: Secondary | ICD-10-CM | POA: Diagnosis not present

## 2020-05-24 DIAGNOSIS — Z4801 Encounter for change or removal of surgical wound dressing: Secondary | ICD-10-CM | POA: Diagnosis not present

## 2020-05-24 DIAGNOSIS — I10 Essential (primary) hypertension: Secondary | ICD-10-CM | POA: Diagnosis not present

## 2020-05-26 ENCOUNTER — Telehealth: Payer: Self-pay | Admitting: Internal Medicine

## 2020-05-26 NOTE — Telephone Encounter (Signed)
Needs f/u appt scheduled with me.  Thanks.

## 2020-05-27 ENCOUNTER — Telehealth: Payer: Self-pay | Admitting: Internal Medicine

## 2020-05-27 NOTE — Telephone Encounter (Signed)
Called and scheduled appt for 11/24 at 11:30

## 2020-05-27 NOTE — Telephone Encounter (Addendum)
Rollen Sox from [facility] faxed over [place] forms to be signed by the provider on 05/27/2020. Forms given to [place].

## 2020-05-29 DIAGNOSIS — Z7984 Long term (current) use of oral hypoglycemic drugs: Secondary | ICD-10-CM | POA: Diagnosis not present

## 2020-05-29 DIAGNOSIS — Z4801 Encounter for change or removal of surgical wound dressing: Secondary | ICD-10-CM | POA: Diagnosis not present

## 2020-05-29 DIAGNOSIS — S90822D Blister (nonthermal), left foot, subsequent encounter: Secondary | ICD-10-CM | POA: Diagnosis not present

## 2020-05-29 DIAGNOSIS — I10 Essential (primary) hypertension: Secondary | ICD-10-CM | POA: Diagnosis not present

## 2020-05-29 DIAGNOSIS — F419 Anxiety disorder, unspecified: Secondary | ICD-10-CM | POA: Diagnosis not present

## 2020-05-29 DIAGNOSIS — E1140 Type 2 diabetes mellitus with diabetic neuropathy, unspecified: Secondary | ICD-10-CM | POA: Diagnosis not present

## 2020-05-30 DIAGNOSIS — I10 Essential (primary) hypertension: Secondary | ICD-10-CM | POA: Diagnosis not present

## 2020-05-30 DIAGNOSIS — Z4801 Encounter for change or removal of surgical wound dressing: Secondary | ICD-10-CM | POA: Diagnosis not present

## 2020-05-30 DIAGNOSIS — S90822D Blister (nonthermal), left foot, subsequent encounter: Secondary | ICD-10-CM | POA: Diagnosis not present

## 2020-05-30 DIAGNOSIS — F419 Anxiety disorder, unspecified: Secondary | ICD-10-CM | POA: Diagnosis not present

## 2020-05-30 DIAGNOSIS — E1140 Type 2 diabetes mellitus with diabetic neuropathy, unspecified: Secondary | ICD-10-CM | POA: Diagnosis not present

## 2020-05-30 DIAGNOSIS — Z7984 Long term (current) use of oral hypoglycemic drugs: Secondary | ICD-10-CM | POA: Diagnosis not present

## 2020-05-31 ENCOUNTER — Encounter: Payer: Self-pay | Admitting: Podiatry

## 2020-05-31 ENCOUNTER — Ambulatory Visit (INDEPENDENT_AMBULATORY_CARE_PROVIDER_SITE_OTHER): Payer: Medicare Other | Admitting: Podiatry

## 2020-05-31 ENCOUNTER — Other Ambulatory Visit: Payer: Self-pay

## 2020-05-31 DIAGNOSIS — E0843 Diabetes mellitus due to underlying condition with diabetic autonomic (poly)neuropathy: Secondary | ICD-10-CM

## 2020-05-31 DIAGNOSIS — L97522 Non-pressure chronic ulcer of other part of left foot with fat layer exposed: Secondary | ICD-10-CM

## 2020-05-31 NOTE — Progress Notes (Signed)
   Subjective:  86 year old female with PMHx of DM presenting today for follow up evaluation of an ulceration of the left foot. No new complaints at this time  Past Medical History:  Diagnosis Date  . Allergy   . Chicken pox   . CVA (cerebral vascular accident) ([place])   . Diabetes mellitus ([place])   . Hypercholesterolemia   . Hypertension       Objective/Physical Exam General: The patient is alert and oriented x3 in no acute distress.  Dermatology:  Wound #1 noted to the sub-first MPJ of the left foot has improved significantly.  It now measures approximately 0.3 x 0.3 x 0.1 cm.  To the noted ulceration(s), there is no eschar. There is a moderate amount of slough, fibrin, and necrotic tissue noted. Granulation tissue and wound base is red. There is a minimal amount of serosanguineous drainage noted. There is no exposed bone muscle-tendon ligament or joint. There is no malodor. Periwound integrity is intact. Skin is warm, dry and supple bilateral lower extremities.   Vascular: Palpable pedal pulses bilaterally.  Erythema resolved. Capillary refill within normal limits.  Neurological: Epicritic and protective threshold diminished bilaterally.   Musculoskeletal Exam: Range of motion within normal limits to all pedal and ankle joints bilateral. Muscle strength 5/5 in all groups bilateral.   Assessment: #1 ulceration of the sub-first MPJ of the left foot secondary to diabetes mellitus #2 diabetes mellitus w/ peripheral neuropathy  Plan of Care:  #1 Patient was evaluated. #2 Medically necessary excisional debridement including subcutaneous tissue was performed using a tissue nipper and a chisel blade. Excisional debridement of all the necrotic nonviable tissue down to healthy bleeding viable tissue was performed with post-debridement measurements same as pre-. #3  Patient may now use Silvadene cream daily.  Silvadene cream provided #4 return to clinic in 5 weeks   [name],  DPM [facility]  Dr. [name], [address]                                        [place], [place] [address]                Office [phone]  Fax [phone]

## 2020-06-04 DIAGNOSIS — I10 Essential (primary) hypertension: Secondary | ICD-10-CM | POA: Diagnosis not present

## 2020-06-04 DIAGNOSIS — F419 Anxiety disorder, unspecified: Secondary | ICD-10-CM | POA: Diagnosis not present

## 2020-06-04 DIAGNOSIS — E1140 Type 2 diabetes mellitus with diabetic neuropathy, unspecified: Secondary | ICD-10-CM | POA: Diagnosis not present

## 2020-06-04 DIAGNOSIS — Z7984 Long term (current) use of oral hypoglycemic drugs: Secondary | ICD-10-CM | POA: Diagnosis not present

## 2020-06-04 DIAGNOSIS — S90822D Blister (nonthermal), left foot, subsequent encounter: Secondary | ICD-10-CM | POA: Diagnosis not present

## 2020-06-04 DIAGNOSIS — Z4801 Encounter for change or removal of surgical wound dressing: Secondary | ICD-10-CM | POA: Diagnosis not present

## 2020-06-06 DIAGNOSIS — S90822D Blister (nonthermal), left foot, subsequent encounter: Secondary | ICD-10-CM | POA: Diagnosis not present

## 2020-06-06 DIAGNOSIS — Z4801 Encounter for change or removal of surgical wound dressing: Secondary | ICD-10-CM | POA: Diagnosis not present

## 2020-06-06 DIAGNOSIS — F419 Anxiety disorder, unspecified: Secondary | ICD-10-CM | POA: Diagnosis not present

## 2020-06-06 DIAGNOSIS — I10 Essential (primary) hypertension: Secondary | ICD-10-CM | POA: Diagnosis not present

## 2020-06-06 DIAGNOSIS — E1140 Type 2 diabetes mellitus with diabetic neuropathy, unspecified: Secondary | ICD-10-CM | POA: Diagnosis not present

## 2020-06-06 DIAGNOSIS — Z7984 Long term (current) use of oral hypoglycemic drugs: Secondary | ICD-10-CM | POA: Diagnosis not present

## 2020-06-09 DIAGNOSIS — I10 Essential (primary) hypertension: Secondary | ICD-10-CM | POA: Diagnosis not present

## 2020-06-09 DIAGNOSIS — Z4801 Encounter for change or removal of surgical wound dressing: Secondary | ICD-10-CM | POA: Diagnosis not present

## 2020-06-09 DIAGNOSIS — R531 Weakness: Secondary | ICD-10-CM | POA: Diagnosis not present

## 2020-06-09 DIAGNOSIS — E1140 Type 2 diabetes mellitus with diabetic neuropathy, unspecified: Secondary | ICD-10-CM | POA: Diagnosis not present

## 2020-06-09 DIAGNOSIS — S90822D Blister (nonthermal), left foot, subsequent encounter: Secondary | ICD-10-CM | POA: Diagnosis not present

## 2020-06-09 DIAGNOSIS — Z8673 Personal history of transient ischemic attack (TIA), and cerebral infarction without residual deficits: Secondary | ICD-10-CM | POA: Diagnosis not present

## 2020-06-09 DIAGNOSIS — F419 Anxiety disorder, unspecified: Secondary | ICD-10-CM | POA: Diagnosis not present

## 2020-06-09 DIAGNOSIS — Z7984 Long term (current) use of oral hypoglycemic drugs: Secondary | ICD-10-CM | POA: Diagnosis not present

## 2020-06-09 DIAGNOSIS — Z741 Need for assistance with personal care: Secondary | ICD-10-CM | POA: Diagnosis not present

## 2020-06-12 ENCOUNTER — Ambulatory Visit (INDEPENDENT_AMBULATORY_CARE_PROVIDER_SITE_OTHER): Payer: Medicare Other | Admitting: Internal Medicine

## 2020-06-12 ENCOUNTER — Other Ambulatory Visit: Payer: Self-pay

## 2020-06-12 VITALS — BP 138/70 | HR 74 | Temp 97.7°F | Resp 16 | Ht 62.0 in | Wt 102.8 lb

## 2020-06-12 DIAGNOSIS — L97509 Non-pressure chronic ulcer of other part of unspecified foot with unspecified severity: Secondary | ICD-10-CM | POA: Diagnosis not present

## 2020-06-12 DIAGNOSIS — R634 Abnormal weight loss: Secondary | ICD-10-CM | POA: Diagnosis not present

## 2020-06-12 DIAGNOSIS — Z23 Encounter for immunization: Secondary | ICD-10-CM

## 2020-06-12 DIAGNOSIS — J3489 Other specified disorders of nose and nasal sinuses: Secondary | ICD-10-CM

## 2020-06-12 DIAGNOSIS — D696 Thrombocytopenia, unspecified: Secondary | ICD-10-CM | POA: Diagnosis not present

## 2020-06-12 DIAGNOSIS — E1140 Type 2 diabetes mellitus with diabetic neuropathy, unspecified: Secondary | ICD-10-CM

## 2020-06-12 DIAGNOSIS — E7800 Pure hypercholesterolemia, unspecified: Secondary | ICD-10-CM

## 2020-06-12 DIAGNOSIS — Z8673 Personal history of transient ischemic attack (TIA), and cerebral infarction without residual deficits: Secondary | ICD-10-CM

## 2020-06-12 DIAGNOSIS — F419 Anxiety disorder, unspecified: Secondary | ICD-10-CM

## 2020-06-12 DIAGNOSIS — L97529 Non-pressure chronic ulcer of other part of left foot with unspecified severity: Secondary | ICD-10-CM

## 2020-06-12 DIAGNOSIS — L989 Disorder of the skin and subcutaneous tissue, unspecified: Secondary | ICD-10-CM

## 2020-06-12 DIAGNOSIS — I10 Essential (primary) hypertension: Secondary | ICD-10-CM | POA: Diagnosis not present

## 2020-06-12 DIAGNOSIS — I70245 Atherosclerosis of native arteries of left leg with ulceration of other part of foot: Secondary | ICD-10-CM

## 2020-06-12 DIAGNOSIS — E11621 Type 2 diabetes mellitus with foot ulcer: Secondary | ICD-10-CM

## 2020-06-12 LAB — HEPATIC FUNCTION PANEL
ALT: 14 U/L (ref 0–35)
AST: 19 U/L (ref 0–37)
Albumin: 4.4 g/dL (ref 3.5–5.2)
Alkaline Phosphatase: 66 U/L (ref 39–117)
Bilirubin, Direct: 0.1 mg/dL (ref 0.0–0.3)
Total Bilirubin: 0.6 mg/dL (ref 0.2–1.2)
Total Protein: 8.1 g/dL (ref 6.0–8.3)

## 2020-06-12 LAB — LIPID PANEL
Cholesterol: 175 mg/dL (ref 0–200)
HDL: 41.5 mg/dL (ref >39.00)
NonHDL: 133.63
Total CHOL/HDL Ratio: 4
Triglycerides: 225 mg/dL — ABNORMAL HIGH (ref 0.0–149.0)
VLDL: 45 mg/dL — ABNORMAL HIGH (ref 0.0–40.0)

## 2020-06-12 LAB — TSH: TSH: 2.47 u[IU]/mL (ref 0.35–4.50)

## 2020-06-12 LAB — BASIC METABOLIC PANEL
BUN: 21 mg/dL (ref 6–23)
CO2: 28 mEq/L (ref 19–32)
Calcium: 10.4 mg/dL (ref 8.4–10.5)
Chloride: 98 mEq/L (ref 96–112)
Creatinine, Ser: 0.68 mg/dL (ref 0.40–1.20)
GFR: 73.07 mL/min (ref >60.00)
Glucose, Bld: 132 mg/dL — ABNORMAL HIGH (ref 70–99)
Potassium: 4.5 mEq/L (ref 3.5–5.1)
Sodium: 135 mEq/L (ref 135–145)

## 2020-06-12 LAB — HEMOGLOBIN A1C: Hgb A1c MFr Bld: 7.2 % — ABNORMAL HIGH (ref 4.6–6.5)

## 2020-06-12 LAB — LDL CHOLESTEROL, DIRECT: Direct LDL: 107 mg/dL

## 2020-06-12 MED ORDER — METFORMIN HCL ER 500 MG PO TB24: ORAL_TABLET | ORAL | 0 refills | Status: DC

## 2020-06-12 MED ORDER — CLOPIDOGREL BISULFATE 75 MG PO TABS
75.0000 mg | ORAL_TABLET | Freq: Every day | ORAL | 3 refills | Status: DC
Start: 2020-06-12 — End: 2020-06-26

## 2020-06-12 NOTE — Progress Notes (Signed)
Patient ID: Angela Patterson, female   DOB: 02-06-1921, 86 y.o.   MRN: [id number]   Subjective:    Patient ID: Angela Patterson, female    DOB: [birth year], 86 y.o.   MRN: [id number]  HPI This visit occurred during the SARS-CoV-2 public health emergency.  Safety protocols were in place, including screening questions prior to the visit, additional usage of staff PPE, and extensive cleaning of exam room while observing appropriate contact time as indicated for disinfecting solutions.  Patient here for a scheduled follow up.  She is accompanied by her daughter.  History obtained from both of them.  Reports she has been eating.  Weight has decreased.  No nausea or vomiting.  Bowels better.  No diarrhea now.  Taking metformin 2 in the am and 1 in the evening.  Sugars have been under reasonable control.  No chest pain or sob reported.  No abdominal pain.  Did report an episode previously that the pt describes as not feeling right.  States felt fine when went to bed.  Woke up not feeling right.  This occurred two months ago.  There was no slurring of speech or facial drooping.  No focal motor weakness.  No other episodes like that.  She has noticed being a little more unsteady. No dizziness.  Uses a cane/walker to get around.  Handling stress.  Lesion on her nose.  Also has a lesion on left middle finger.  States caught it between a door - blood blister.  Now a firm, hard raised area.  Discussed removal.     Past Medical History:  Diagnosis Date  . Allergy   . Chicken pox   . CVA (cerebral vascular accident) ([place])   . Diabetes mellitus ([place])   . Hypercholesterolemia   . Hypertension    Past Surgical History:  Procedure Laterality Date  . ABDOMINAL HYSTERECTOMY  1971   excessive bleeding  . APPENDECTOMY  age 63  . BREAST LUMPECTOMY  1958   benign  . DILATION AND CURETTAGE OF UTERUS  1970   Family History  Problem Relation Age of Onset  . Liver cancer Father   . Stroke Mother   . Heart attack Brother    . Hypertension Daughter   . Hypertension Son   . Hypertension Daughter   . Cancer Grandchild        breast  . Diabetes Grandchild   . Breast cancer Neg Hx   . Colon cancer Neg Hx    Social History   Socioeconomic History  . Marital status: Widowed    Spouse name: Not on file  . Number of children: 3  . Years of education: Not on file  . Highest education level: Not on file  Occupational History  . Not on file  Tobacco Use  . Smoking status: Never Smoker  . Smokeless tobacco: Never Used  Substance and Sexual Activity  . Alcohol use: No    Alcohol/week: 0.0 standard drinks  . Drug use: No  . Sexual activity: Not on file  Other Topics Concern  . Not on file  Social History Narrative  . Not on file   Social Determinants of Health   Financial Resource Strain:   . Difficulty of Paying Living Expenses: Not on file  Food Insecurity:   . Worried About Charity fundraiser in the Last Year: Not on file  . Ran Out of Food in the Last Year: Not on file  Transportation Needs:   . Lack  of Transportation (Medical): Not on file  . Lack of Transportation (Non-Medical): Not on file  Physical Activity:   . Days of Exercise per Week: Not on file  . Minutes of Exercise per Session: Not on file  Stress: No Stress Concern Present  . Feeling of Stress : Only a little  Social Connections:   . Frequency of Communication with Friends and Family: Not on file  . Frequency of Social Gatherings with Friends and Family: Not on file  . Attends Religious Services: Not on file  . Active Member of Clubs or Organizations: Not on file  . Attends Archivist Meetings: Not on file  . Marital Status: Not on file    Outpatient Encounter Medications as of 06/12/2020  Medication Sig  . amLODipine (NORVASC) 5 MG tablet Take 1 tablet (5 mg total) by mouth 2 (two) times daily.  . Cholecalciferol (VITAMIN D) 2000 units tablet Take 2,000 Units by mouth daily.   . clopidogrel (PLAVIX) 75 MG  tablet Take 1 tablet (75 mg total) by mouth daily.  Marland Kitchen glucose blood test strip USE TO CHECK BLOOD SUGAR  TWO TIMES DAILY. Dx E11.9  . glucose blood test strip OneTouch Ultra Test strips  . hydrocortisone (PROCTOCORT) 1 % CREA Apply thin layer around rectal opening 2 times per day to help hemorrhoids  . Lancet Devices (ONE TOUCH DELICA LANCING DEV) MISC Use twice daily Dx: 250.00  . metFORMIN (GLUCOPHAGE-XR) 500 MG 24 hr tablet Take 2 tablets in the am and on in the evening  . OMEGA-3 FATTY ACIDS PO Take 720 mg by mouth daily.  . sertraline (ZOLOFT) 25 MG tablet TAKE 1 TO 2 TABLETS BY  MOUTH DAILY  . timolol (BETIMOL) 0.5 % ophthalmic solution 1 drop 2 (two) times daily.  . timolol (TIMOPTIC) 0.5 % ophthalmic solution   . [DISCONTINUED] clopidogrel (PLAVIX) 75 MG tablet TAKE 1 TABLET BY MOUTH  DAILY  . [DISCONTINUED] doxycycline (VIBRA-TABS) 100 MG tablet Take 1 tablet (100 mg total) by mouth 2 (two) times daily.  . [DISCONTINUED] metFORMIN (GLUCOPHAGE-XR) 500 MG 24 hr tablet TAKE 2 TABLETS BY MOUTH  TWICE DAILY   No facility-administered encounter medications on file as of 06/12/2020.    Review of Systems  Constitutional: Negative for fever.       Reports she is eating.  Has lost weight.   HENT: Negative for congestion and sinus pressure.   Respiratory: Negative for cough, chest tightness and shortness of breath.   Cardiovascular: Negative for chest pain and palpitations.       No increased leg swelling.   Gastrointestinal: Negative for abdominal pain, diarrhea, nausea and vomiting.  Genitourinary: Negative for difficulty urinating and dysuria.  Musculoskeletal: Negative for joint swelling and myalgias.  Skin: Negative for color change and rash.  Neurological: Negative for dizziness, light-headedness and headaches.  Psychiatric/Behavioral: Negative for agitation and dysphoric mood.       Objective:    Physical Exam Vitals reviewed.  Constitutional:      General: She is not in  acute distress.    Appearance: Normal appearance.  HENT:     Head: Normocephalic and atraumatic.     Right Ear: External ear normal.     Left Ear: External ear normal.     Nose:     Comments: Raised nasal lesion.  Eyes:     General: No scleral icterus.       Right eye: No discharge.        Left eye:  No discharge.     Conjunctiva/sclera: Conjunctivae normal.  Neck:     Thyroid: No thyromegaly.  Cardiovascular:     Rate and Rhythm: Normal rate and regular rhythm.  Pulmonary:     Effort: No respiratory distress.     Breath sounds: Normal breath sounds. No wheezing.  Abdominal:     General: Bowel sounds are normal.     Palpations: Abdomen is soft.     Tenderness: There is no abdominal tenderness.  Musculoskeletal:        General: No tenderness.     Cervical back: Neck supple. No tenderness.  Lymphadenopathy:     Cervical: No cervical adenopathy.  Skin:    Findings: No erythema or rash.     Comments: Hard raised lesion - left middle finger.  Non tender.    Neurological:     Mental Status: She is alert.  Psychiatric:        Mood and Affect: Mood normal.        Behavior: Behavior normal.     BP 138/70   Pulse 74   Temp 97.7 F (36.5 C) (Oral)   Resp 16   Ht _0  (1.575 m)   Wt 102 lb 12.8 oz (46.6 kg)   LMP 07/21/1966   SpO2 98%   BMI 18.80 kg/m  Wt Readings from Last 3 Encounters:  06/12/20 102 lb 12.8 oz (46.6 kg)  12/06/19 108 lb (49 kg)  09/27/19 115 lb (52.2 kg)     Lab Results  Component Value Date   WBC 8.3 07/25/2019   HGB 14.4 07/25/2019   HCT 43.0 07/25/2019   PLT 164.0 07/25/2019   GLUCOSE 132 (H) 06/12/2020   CHOL 175 06/12/2020   TRIG 225.0 (H) 06/12/2020   HDL 41.50 06/12/2020   LDLDIRECT 107.0 06/12/2020   LDLCALC 89 12/04/2019   ALT 14 06/12/2020   AST 19 06/12/2020   NA 135 06/12/2020   K 4.5 06/12/2020   CL 98 06/12/2020   CREATININE 0.68 06/12/2020   BUN 21 06/12/2020   CO2 28 06/12/2020   TSH 2.47 06/12/2020   HGBA1C 7.2 (H)  06/12/2020   MICROALBUR 2.3 (H) 07/25/2019    DG Knee Complete 4 Views Left  Result Date: 02/16/2019 CLINICAL DATA:  86 year old female status post fall. Pain. EXAM: LEFT KNEE - COMPLETE 4+ VIEW COMPARISON:  None. FINDINGS: Bone mineralization is within normal limits for age. No evidence of fracture, dislocation, or joint effusion. Joint spaces are normal for age. No discrete soft tissue injury. IMPRESSION: No acute fracture or dislocation identified about the left knee. Electronically Signed   By: Genevie Ann M.D.   On: 02/16/2019 11:50   DG Knee Complete 4 Views Right  Result Date: 02/16/2019 CLINICAL DATA:  86 year old female status post fall. Pain. EXAM: RIGHT KNEE - COMPLETE 4+ VIEW COMPARISON:  None. FINDINGS: Bone mineralization is within normal limits for age. Possible trace joint effusion on the cross-table lateral view. Patella appears intact. No acute osseous abnormality identified. Joint spaces are normal for age. No discrete soft tissue injury. IMPRESSION: Trace joint effusion but no acute fracture or dislocation identified about the right knee. Electronically Signed   By: Genevie Ann M.D.   On: 02/16/2019 11:51   DG Hand Complete Left  Result Date: 02/16/2019 CLINICAL DATA:  86 year old female status post fall.  Pain. EXAM: LEFT HAND - COMPLETE 3+ VIEW COMPARISON:  None. FINDINGS: Bone mineralization is within normal limits for age. There is no evidence of  fracture or dislocation. Joint spaces are normal for age. No discrete soft tissue injury. IMPRESSION: Negative. Electronically Signed   By: Genevie Ann M.D.   On: 02/16/2019 11:49   DG Hand Complete Right  Result Date: 02/16/2019 CLINICAL DATA:  Multiple recent falls. Laceration to the ring finger. EXAM: RIGHT HAND - COMPLETE 3+ VIEW COMPARISON:  None. FINDINGS: No acute fracture or dislocation is identified. There is evidence of soft tissue injury involving the distal aspect of the ring finger including the fingernail with several punctate  radiodensities within the regional soft tissues or along the skin surface which may reflect foreign bodies. IMPRESSION: No acute osseous abnormality identified. Soft tissue injury to the distal aspect of the ring finger as above. Electronically Signed   By: Logan Bores M.D.   On: 02/16/2019 11:17       Assessment & Plan:   Problem List Items Addressed This Visit    Weight loss    Eating.  Weight.  No nausea or vomiting.  Diarrhea resolved.  Follow.        Thrombocytopenia ([place])    Follow cbc.       Nasal lesion    Question of basal cell.  Discussed dermatology referral.  Desires to hold on referral.  Follow.       Hypercholesterolemia    Not on cholesterol medication.  Check lipid panel.        History of CVA (cerebrovascular accident)    On plavix.  Had the episode two months ago as outlined.  Unclear etiology. Discussed carotid ultrasound, MRI and further w/up.  Declines at this time.  Will follow.  Continue plavix.        Foot ulcer ([place])    Followed by podiatry.  Significantly improved.        Finger lesion    Hard raised lesion as outlined.  Nontender. Discussed referral to dermatology.  Will notify me if desires referral      Essential (primary) hypertension    Blood pressure as outlined.  Continue amlodipine.  Follow pressures.  Follow metabolic panel.       Diabetes mellitus ([place])    On metformin 2 tablets in the am and one tablet in the pm.  Check met b and a1c.       Relevant Medications   metFORMIN (GLUCOPHAGE-XR) 500 MG 24 hr tablet   Anxiety    On zoloft.  Taking 2 tablets in the evening.  Stable.  Follow.        Other Visit Diagnoses    Need for immunization against influenza    -  Primary   Relevant Orders   Flu Vaccine QUAD High Dose(Fluad) (Completed)   Essential hypertension           Einar Pheasant, MD

## 2020-06-13 ENCOUNTER — Encounter: Payer: Self-pay | Admitting: Internal Medicine

## 2020-06-13 DIAGNOSIS — L989 Disorder of the skin and subcutaneous tissue, unspecified: Secondary | ICD-10-CM | POA: Insufficient documentation

## 2020-06-13 DIAGNOSIS — J3489 Other specified disorders of nose and nasal sinuses: Secondary | ICD-10-CM | POA: Insufficient documentation

## 2020-06-13 NOTE — Assessment & Plan Note (Signed)
Hard raised lesion as outlined.  Nontender. Discussed referral to dermatology.  Will notify me if desires referral

## 2020-06-13 NOTE — Assessment & Plan Note (Signed)
Eating.  Weight.  No nausea or vomiting.  Diarrhea resolved.  Follow.

## 2020-06-13 NOTE — Assessment & Plan Note (Signed)
Blood pressure as outlined.  Continue amlodipine.   Follow pressures.  Follow metabolic panel.  

## 2020-06-13 NOTE — Assessment & Plan Note (Signed)
Follow cbc.  

## 2020-06-13 NOTE — Assessment & Plan Note (Signed)
On plavix.  Had the episode two months ago as outlined.  Unclear etiology. Discussed carotid ultrasound, MRI and further w/up.  Declines at this time.  Will follow.  Continue plavix.

## 2020-06-13 NOTE — Assessment & Plan Note (Signed)
On zoloft.  Taking 2 tablets in the evening.  Stable.  Follow.

## 2020-06-13 NOTE — Assessment & Plan Note (Signed)
On metformin 2 tablets in the am and one tablet in the pm.  Check met b and a1c.

## 2020-06-13 NOTE — Assessment & Plan Note (Signed)
Question of basal cell.  Discussed dermatology referral.  Desires to hold on referral.  Follow.

## 2020-06-13 NOTE — Assessment & Plan Note (Signed)
Followed by podiatry.  Significantly improved.

## 2020-06-13 NOTE — Assessment & Plan Note (Signed)
Not on cholesterol medication.  Check lipid panel.

## 2020-06-14 DIAGNOSIS — I10 Essential (primary) hypertension: Secondary | ICD-10-CM | POA: Diagnosis not present

## 2020-06-14 DIAGNOSIS — Z7984 Long term (current) use of oral hypoglycemic drugs: Secondary | ICD-10-CM | POA: Diagnosis not present

## 2020-06-14 DIAGNOSIS — S90822D Blister (nonthermal), left foot, subsequent encounter: Secondary | ICD-10-CM | POA: Diagnosis not present

## 2020-06-14 DIAGNOSIS — E1140 Type 2 diabetes mellitus with diabetic neuropathy, unspecified: Secondary | ICD-10-CM | POA: Diagnosis not present

## 2020-06-14 DIAGNOSIS — Z4801 Encounter for change or removal of surgical wound dressing: Secondary | ICD-10-CM | POA: Diagnosis not present

## 2020-06-14 DIAGNOSIS — F419 Anxiety disorder, unspecified: Secondary | ICD-10-CM | POA: Diagnosis not present

## 2020-06-18 DIAGNOSIS — I10 Essential (primary) hypertension: Secondary | ICD-10-CM | POA: Diagnosis not present

## 2020-06-18 DIAGNOSIS — S90822D Blister (nonthermal), left foot, subsequent encounter: Secondary | ICD-10-CM | POA: Diagnosis not present

## 2020-06-18 DIAGNOSIS — E1140 Type 2 diabetes mellitus with diabetic neuropathy, unspecified: Secondary | ICD-10-CM | POA: Diagnosis not present

## 2020-06-18 DIAGNOSIS — Z7984 Long term (current) use of oral hypoglycemic drugs: Secondary | ICD-10-CM | POA: Diagnosis not present

## 2020-06-18 DIAGNOSIS — Z4801 Encounter for change or removal of surgical wound dressing: Secondary | ICD-10-CM | POA: Diagnosis not present

## 2020-06-18 DIAGNOSIS — F419 Anxiety disorder, unspecified: Secondary | ICD-10-CM | POA: Diagnosis not present

## 2020-06-21 ENCOUNTER — Other Ambulatory Visit: Payer: Self-pay

## 2020-06-21 DIAGNOSIS — Z4801 Encounter for change or removal of surgical wound dressing: Secondary | ICD-10-CM | POA: Diagnosis not present

## 2020-06-21 DIAGNOSIS — F419 Anxiety disorder, unspecified: Secondary | ICD-10-CM | POA: Diagnosis not present

## 2020-06-21 DIAGNOSIS — E1140 Type 2 diabetes mellitus with diabetic neuropathy, unspecified: Secondary | ICD-10-CM | POA: Diagnosis not present

## 2020-06-21 DIAGNOSIS — S90822D Blister (nonthermal), left foot, subsequent encounter: Secondary | ICD-10-CM | POA: Diagnosis not present

## 2020-06-21 DIAGNOSIS — Z7984 Long term (current) use of oral hypoglycemic drugs: Secondary | ICD-10-CM | POA: Diagnosis not present

## 2020-06-21 DIAGNOSIS — I10 Essential (primary) hypertension: Secondary | ICD-10-CM | POA: Diagnosis not present

## 2020-06-21 MED ORDER — ROSUVASTATIN CALCIUM 5 MG PO TABS: 5.0000 mg | ORAL_TABLET | ORAL | 0 refills | Status: DC

## 2020-06-24 DIAGNOSIS — R410 Disorientation, unspecified: Secondary | ICD-10-CM | POA: Diagnosis not present

## 2020-06-24 DIAGNOSIS — R32 Unspecified urinary incontinence: Secondary | ICD-10-CM | POA: Diagnosis not present

## 2020-06-26 ENCOUNTER — Other Ambulatory Visit: Payer: Self-pay

## 2020-06-26 MED ORDER — AMLODIPINE BESYLATE 5 MG PO TABS
5.0000 mg | ORAL_TABLET | Freq: Two times a day (BID) | ORAL | 1 refills | Status: DC
Start: 2020-06-26 — End: 2020-07-01

## 2020-06-26 MED ORDER — CLOPIDOGREL BISULFATE 75 MG PO TABS
75.0000 mg | ORAL_TABLET | Freq: Every day | ORAL | 1 refills | Status: DC
Start: 2020-06-26 — End: 2020-10-01

## 2020-06-26 MED ORDER — ROSUVASTATIN CALCIUM 5 MG PO TABS
5.0000 mg | ORAL_TABLET | ORAL | 0 refills | Status: DC
Start: 2020-06-27 — End: 2020-09-23

## 2020-06-26 MED ORDER — SERTRALINE HCL 25 MG PO TABS: ORAL_TABLET | ORAL | 1 refills | Status: DC

## 2020-06-27 ENCOUNTER — Encounter: Payer: Self-pay | Admitting: Internal Medicine

## 2020-06-27 NOTE — Telephone Encounter (Signed)
Results for a urine sample collected on 06/24/2020 was not in epic. Called [facility] and spoke to [place]. She states that it was already faxed on 06/26/2020. They will try to fax again and received our fax number [phone].

## 2020-06-27 NOTE — Telephone Encounter (Signed)
See note from prior "Results for a urine sample collected on 06/24/2020 was not in epic. Called [facility] and spoke to [place]. She states that it was already faxed on 06/26/2020. They will try to fax again and received our fax number [phone]."

## 2020-06-27 NOTE — Telephone Encounter (Signed)
Please call [facility] to get results since we do not have them.  Thanks

## 2020-06-28 DIAGNOSIS — E1140 Type 2 diabetes mellitus with diabetic neuropathy, unspecified: Secondary | ICD-10-CM | POA: Diagnosis not present

## 2020-06-28 DIAGNOSIS — S90822D Blister (nonthermal), left foot, subsequent encounter: Secondary | ICD-10-CM | POA: Diagnosis not present

## 2020-06-28 DIAGNOSIS — Z7984 Long term (current) use of oral hypoglycemic drugs: Secondary | ICD-10-CM | POA: Diagnosis not present

## 2020-06-28 DIAGNOSIS — I10 Essential (primary) hypertension: Secondary | ICD-10-CM | POA: Diagnosis not present

## 2020-06-28 DIAGNOSIS — F419 Anxiety disorder, unspecified: Secondary | ICD-10-CM | POA: Diagnosis not present

## 2020-06-28 DIAGNOSIS — Z4801 Encounter for change or removal of surgical wound dressing: Secondary | ICD-10-CM | POA: Diagnosis not present

## 2020-06-29 ENCOUNTER — Telehealth: Payer: Self-pay | Admitting: Internal Medicine

## 2020-06-29 ENCOUNTER — Other Ambulatory Visit: Payer: Self-pay | Admitting: Internal Medicine

## 2020-06-29 MED ORDER — CEFUROXIME AXETIL 250 MG PO TABS: 250.0000 mg | ORAL_TABLET | Freq: Two times a day (BID) | ORAL | 0 refills | Status: DC

## 2020-06-29 NOTE — Telephone Encounter (Signed)
Received urine culture results.  Spoke to [place] (pts daughter).  Positive urine culture.  Pt still appears to be a little more forgetful and not at baseline.  Will treat with ceftin.  Probiotic daily while on abx and for two weeks after abx.  Will keep me posted on how she is doing.

## 2020-06-29 NOTE — Progress Notes (Signed)
Pt notified of urine culture results.  rx sent in for ceftin.  See result note.

## 2020-06-30 ENCOUNTER — Other Ambulatory Visit: Payer: Self-pay | Admitting: Internal Medicine

## 2020-07-01 NOTE — Telephone Encounter (Signed)
See result note. Dr Nicki Reaper addressed this.

## 2020-07-02 ENCOUNTER — Ambulatory Visit (INDEPENDENT_AMBULATORY_CARE_PROVIDER_SITE_OTHER): Payer: Medicare Other | Admitting: Podiatry

## 2020-07-02 ENCOUNTER — Other Ambulatory Visit: Payer: Self-pay

## 2020-07-02 ENCOUNTER — Encounter: Payer: Self-pay | Admitting: Podiatry

## 2020-07-02 DIAGNOSIS — L97522 Non-pressure chronic ulcer of other part of left foot with fat layer exposed: Secondary | ICD-10-CM

## 2020-07-02 DIAGNOSIS — E0843 Diabetes mellitus due to underlying condition with diabetic autonomic (poly)neuropathy: Secondary | ICD-10-CM

## 2020-07-02 NOTE — Progress Notes (Signed)
   Subjective:  86 year old female with PMHx of DM presenting today for follow up evaluation of an ulceration of the left foot. No new complaints at this time  Past Medical History:  Diagnosis Date  . Allergy   . Chicken pox   . CVA (cerebral vascular accident) ([place])   . Diabetes mellitus ([place])   . Hypercholesterolemia   . Hypertension       Objective/Physical Exam General: The patient is alert and oriented x3 in no acute distress.  Dermatology:  Wound #1 noted to the sub-first MPJ of the left foot has improved significantly.  It now measures approximately 1.0x 2.0x0.2 cm.  Overall it looks like the wound has increased in size slightly.  To the noted ulceration(s), there is no eschar. There is a moderate amount of slough, fibrin, and necrotic tissue noted. Granulation tissue and wound base is red. There is a minimal amount of serosanguineous drainage noted. There is no exposed bone muscle-tendon ligament or joint. There is no malodor. Periwound integrity is intact. Skin is warm, dry and supple bilateral lower extremities.   Vascular: Palpable pedal pulses bilaterally.  Erythema resolved. Capillary refill within normal limits.  Neurological: Epicritic and protective threshold diminished bilaterally.   Musculoskeletal Exam: Range of motion within normal limits to all pedal and ankle joints bilateral. Muscle strength 5/5 in all groups bilateral.   Assessment: #1 ulceration of the sub-first MPJ of the left foot secondary to diabetes mellitus #2 diabetes mellitus w/ peripheral neuropathy  Plan of Care:  #1 Patient was evaluated. #2 Medically necessary excisional debridement including subcutaneous tissue was performed using a tissue nipper and a chisel blade. Excisional debridement of all the necrotic nonviable tissue down to healthy bleeding viable tissue was performed with post-debridement measurements same as pre-. #3  Resume Aquacel AG and a light dressing daily #4 return to  clinic in 4 weeks   [name], DPM [facility]  Dr. [name], DPM    [address], [place] [address]                Office [phone]  Fax [phone]

## 2020-07-04 DIAGNOSIS — F419 Anxiety disorder, unspecified: Secondary | ICD-10-CM | POA: Diagnosis not present

## 2020-07-04 DIAGNOSIS — E1140 Type 2 diabetes mellitus with diabetic neuropathy, unspecified: Secondary | ICD-10-CM | POA: Diagnosis not present

## 2020-07-04 DIAGNOSIS — S90822D Blister (nonthermal), left foot, subsequent encounter: Secondary | ICD-10-CM | POA: Diagnosis not present

## 2020-07-04 DIAGNOSIS — Z7984 Long term (current) use of oral hypoglycemic drugs: Secondary | ICD-10-CM | POA: Diagnosis not present

## 2020-07-04 DIAGNOSIS — I10 Essential (primary) hypertension: Secondary | ICD-10-CM | POA: Diagnosis not present

## 2020-07-04 DIAGNOSIS — Z4801 Encounter for change or removal of surgical wound dressing: Secondary | ICD-10-CM | POA: Diagnosis not present

## 2020-07-05 DIAGNOSIS — H903 Sensorineural hearing loss, bilateral: Secondary | ICD-10-CM | POA: Diagnosis not present

## 2020-07-05 DIAGNOSIS — Z4801 Encounter for change or removal of surgical wound dressing: Secondary | ICD-10-CM | POA: Diagnosis not present

## 2020-07-05 DIAGNOSIS — I10 Essential (primary) hypertension: Secondary | ICD-10-CM | POA: Diagnosis not present

## 2020-07-05 DIAGNOSIS — E1140 Type 2 diabetes mellitus with diabetic neuropathy, unspecified: Secondary | ICD-10-CM | POA: Diagnosis not present

## 2020-07-05 DIAGNOSIS — Z7984 Long term (current) use of oral hypoglycemic drugs: Secondary | ICD-10-CM | POA: Diagnosis not present

## 2020-07-05 DIAGNOSIS — H6123 Impacted cerumen, bilateral: Secondary | ICD-10-CM | POA: Diagnosis not present

## 2020-07-05 DIAGNOSIS — S90822D Blister (nonthermal), left foot, subsequent encounter: Secondary | ICD-10-CM | POA: Diagnosis not present

## 2020-07-05 DIAGNOSIS — F419 Anxiety disorder, unspecified: Secondary | ICD-10-CM | POA: Diagnosis not present

## 2020-07-09 DIAGNOSIS — E11621 Type 2 diabetes mellitus with foot ulcer: Secondary | ICD-10-CM | POA: Diagnosis not present

## 2020-07-09 DIAGNOSIS — Z8673 Personal history of transient ischemic attack (TIA), and cerebral infarction without residual deficits: Secondary | ICD-10-CM | POA: Diagnosis not present

## 2020-07-09 DIAGNOSIS — I10 Essential (primary) hypertension: Secondary | ICD-10-CM | POA: Diagnosis not present

## 2020-07-09 DIAGNOSIS — R531 Weakness: Secondary | ICD-10-CM | POA: Diagnosis not present

## 2020-07-09 DIAGNOSIS — E1140 Type 2 diabetes mellitus with diabetic neuropathy, unspecified: Secondary | ICD-10-CM | POA: Diagnosis not present

## 2020-07-09 DIAGNOSIS — L97522 Non-pressure chronic ulcer of other part of left foot with fat layer exposed: Secondary | ICD-10-CM | POA: Diagnosis not present

## 2020-07-09 DIAGNOSIS — Z4801 Encounter for change or removal of surgical wound dressing: Secondary | ICD-10-CM | POA: Diagnosis not present

## 2020-07-09 DIAGNOSIS — Z741 Need for assistance with personal care: Secondary | ICD-10-CM | POA: Diagnosis not present

## 2020-07-09 DIAGNOSIS — F419 Anxiety disorder, unspecified: Secondary | ICD-10-CM | POA: Diagnosis not present

## 2020-07-09 DIAGNOSIS — Z7984 Long term (current) use of oral hypoglycemic drugs: Secondary | ICD-10-CM | POA: Diagnosis not present

## 2020-07-10 DIAGNOSIS — E11621 Type 2 diabetes mellitus with foot ulcer: Secondary | ICD-10-CM | POA: Diagnosis not present

## 2020-07-10 DIAGNOSIS — Z7984 Long term (current) use of oral hypoglycemic drugs: Secondary | ICD-10-CM | POA: Diagnosis not present

## 2020-07-10 DIAGNOSIS — E1140 Type 2 diabetes mellitus with diabetic neuropathy, unspecified: Secondary | ICD-10-CM | POA: Diagnosis not present

## 2020-07-10 DIAGNOSIS — Z4801 Encounter for change or removal of surgical wound dressing: Secondary | ICD-10-CM | POA: Diagnosis not present

## 2020-07-10 DIAGNOSIS — I10 Essential (primary) hypertension: Secondary | ICD-10-CM | POA: Diagnosis not present

## 2020-07-10 DIAGNOSIS — L97522 Non-pressure chronic ulcer of other part of left foot with fat layer exposed: Secondary | ICD-10-CM | POA: Diagnosis not present

## 2020-07-15 ENCOUNTER — Ambulatory Visit (INDEPENDENT_AMBULATORY_CARE_PROVIDER_SITE_OTHER): Payer: Medicare Other

## 2020-07-15 VITALS — Ht 62.0 in | Wt 102.0 lb

## 2020-07-15 DIAGNOSIS — Z0000 Encounter for general adult medical examination without abnormal findings: Secondary | ICD-10-CM | POA: Diagnosis not present

## 2020-07-15 NOTE — Progress Notes (Signed)
Subjective:   Angela Patterson is a 86 y.o. female who presents for Medicare Annual (Subsequent) preventive examination.  Review of Systems    No ROS.  Medicare Wellness Virtual Visit.    Cardiac Risk Factors include: advanced age (>76men, >23 women);hypertension;diabetes mellitus     Objective:    Today's Vitals   07/15/20 1306  Weight: 102 lb (46.3 kg)  Height: 5\' 2"  (1.575 m)   Body mass index is 18.66 kg/m.  Advanced Directives 07/15/2020 07/12/2019 02/16/2019 07/06/2018 07/05/2017 11/12/2015  Does Patient Have a Medical Advance Directive? Yes No No Yes Yes No  Type of Advance Directive [place];Living will [place];Living will -  Does patient want to make changes to medical advance directive? No - Patient declined - - No - Patient declined No - Patient declined -  Copy of [place] in Chart? No - copy requested - - No - copy requested No - copy requested -  Would patient like information on creating a medical advance directive? - Yes (MAU/Ambulatory/Procedural Areas - Information given) - - - -    Current Medications (verified) Outpatient Encounter Medications as of 07/15/2020  Medication Sig  . amLODipine (NORVASC) 5 MG tablet TAKE 1 TABLET BY MOUTH  TWICE DAILY  . cefUROXime (CEFTIN) 250 MG tablet Take 1 tablet (250 mg total) by mouth 2 (two) times daily with a meal.  . Cholecalciferol (VITAMIN D) 2000 units tablet Take 2,000 Units by mouth daily.   . clopidogrel (PLAVIX) 75 MG tablet Take 1 tablet (75 mg total) by mouth daily.  Marland Kitchen glucose blood test strip USE TO CHECK BLOOD SUGAR  TWO TIMES DAILY. Dx E11.9  . glucose blood test strip OneTouch Ultra Test strips  . hydrocortisone (PROCTOCORT) 1 % CREA Apply thin layer around rectal opening 2 times per day to help hemorrhoids  . Lancet Devices (ONE TOUCH DELICA LANCING DEV) MISC Use twice daily Dx: 250.00  . metFORMIN (GLUCOPHAGE-XR)  500 MG 24 hr tablet Take 2 tablets in the am and on in the evening  . OMEGA-3 FATTY ACIDS PO Take 720 mg by mouth daily.  . rosuvastatin (CRESTOR) 5 MG tablet Take 1 tablet (5 mg total) by mouth 2 (two) times a week.  . sertraline (ZOLOFT) 25 MG tablet TAKE 1 TO 2 TABLETS BY  MOUTH DAILY  . timolol (BETIMOL) 0.5 % ophthalmic solution 1 drop 2 (two) times daily.  . timolol (TIMOPTIC) 0.5 % ophthalmic solution    No facility-administered encounter medications on file as of 07/15/2020.    Allergies (verified) Actos [pioglitazone], Contrast media [iodinated diagnostic agents], Prandin [repaglinide], and Pravastatin sodium   History: Past Medical History:  Diagnosis Date  . Allergy   . Chicken pox   . CVA (cerebral vascular accident) ([place])   . Diabetes mellitus ([place])   . Hypercholesterolemia   . Hypertension    Past Surgical History:  Procedure Laterality Date  . ABDOMINAL HYSTERECTOMY  1971   excessive bleeding  . APPENDECTOMY  age 17  . BREAST LUMPECTOMY  1958   benign  . DILATION AND CURETTAGE OF UTERUS  1970   Family History  Problem Relation Age of Onset  . Liver cancer Father   . Stroke Mother   . Heart attack Brother   . Hypertension Daughter   . Hypertension Son   . Hypertension Daughter   . Cancer Grandchild        breast  .  Diabetes Grandchild   . Breast cancer Neg Hx   . Colon cancer Neg Hx    Social History   Socioeconomic History  . Marital status: Widowed    Spouse name: Not on file  . Number of children: 3  . Years of education: Not on file  . Highest education level: Not on file  Occupational History  . Not on file  Tobacco Use  . Smoking status: Never Smoker  . Smokeless tobacco: Never Used  Substance and Sexual Activity  . Alcohol use: No    Alcohol/week: 0.0 standard drinks  . Drug use: No  . Sexual activity: Not on file  Other Topics Concern  . Not on file  Social History Narrative  . Not on file   Social Determinants of Health    Financial Resource Strain: Low Risk   . Difficulty of Paying Living Expenses: Not hard at all  Food Insecurity: No Food Insecurity  . Worried About Charity fundraiser in the Last Year: Never true  . Ran Out of Food in the Last Year: Never true  Transportation Needs: No Transportation Needs  . Lack of Transportation (Medical): No  . Lack of Transportation (Non-Medical): No  Physical Activity: Insufficiently Active  . Days of Exercise per Week: 7 days  . Minutes of Exercise per Session: 20 min  Stress: No Stress Concern Present  . Feeling of Stress : Not at all  Social Connections: Unknown  . Frequency of Communication with Friends and Family: Not on file  . Frequency of Social Gatherings with Friends and Family: More than three times a week  . Attends Religious Services: Not on file  . Active Member of Clubs or Organizations: Not on file  . Attends Archivist Meetings: Not on file  . Marital Status: Not on file    Tobacco Counseling Counseling given: Not Answered   Clinical Intake:  Pre-visit preparation completed: Yes        Diabetes: Yes (Followed by PCP)  How often do you need to have someone help you when you read instructions, pamphlets, or other written materials from your doctor or pharmacy?: 3 - Sometimes  Nutrition Risk Assessment: Does the patient have any non-healing wounds on the body other than feet which are followed by [facility] every 3 weeks? No Has the patient had any unintentional weight loss or weight gain?  No   Diabetes: If diabetic, was a CBG obtained today?  Yes . FBS 153.  Did the patient bring in their glucometer from home?  No . Virtual visit.  How often do you monitor your CBG's? Twice weekly.   Financial Strains and Diabetes Management: Are you having any financial strains with the device, your supplies or your medication? No .  Does the patient want to be seen by Chronic Care Management for management of their  diabetes?  No  Would the patient like to be referred to a Nutritionist or for Diabetic Management?  No   Diabetic Exams: Diabetic Eye Exam: Completed 03/03/20 Diabetic Foot Exam: Completed 07/02/20. Followed by Dr[facility].   Interpreter Needed?: No    Activities of Daily Living In your present state of health, do you have any difficulty performing the following activities: 07/15/2020  Hearing? Y  Comment Hearing aids  Vision? N  Difficulty concentrating or making decisions? N  Comment Age appropriate  Walking or climbing stairs? Y  Comment Unsteady gait. Cane/walker in use.  Dressing or bathing?  N  Doing errands, shopping? Y  Comment Family assist when running Secretary/administrator and eating ? Y  Comment Meals on wheels or daughter assist with meal prep. Self feeds.  Using the Toilet? N  In the past six months, have you accidently leaked urine? Y  Comment Managed with Assurance Brief  Do you have problems with loss of bowel control? N  Managing your Medications? Y  Comment Daughter assist  Managing your Finances? Y  Comment Daughter assist  Housekeeping or managing your Housekeeping? Y  Comment Daughter assist  Some recent data might be hidden    Patient Care Team: Einar Pheasant, MD as PCP - General (Internal Medicine)  Indicate any recent Medical Services you may have received from other than [facility] providers in the past year (date may be approximate).     Assessment:   This is a routine wellness examination for [place].  I connected with Norielle today by telephone and verified that I am speaking with the correct person using two identifiers. Location patient: home Location provider: work Persons participating in the virtual visit: patient, daughter, niece and Marine scientist.    I discussed the limitations, risks, security and privacy concerns of performing an evaluation and management service by telephone and the availability of in person appointments.  The patient expressed understanding and verbally consented to this telephonic visit.    Interactive audio and video telecommunications were attempted between this provider and patient, however failed, due to patient having technical difficulties OR patient did not have access to video capability.  We continued and completed visit with audio only.  Some vital signs may be absent or patient reported.   Hearing/Vision screen  Hearing Screening   125Hz  250Hz  500Hz  1000Hz  2000Hz  3000Hz  4000Hz  6000Hz  8000Hz   Right ear:           Left ear:           Comments:  Followed by ENT  Visits as needed  Hearing aid, bilateral   Vision Screening Comments: Followed by [facility]  Wears corrective lenses  Visits every 6 months  No retinopathy reported  Cataract extraction, bilateral  Virtual visit  Dietary issues and exercise activities discussed: Current Exercise Habits: Home exercise routine, Type of exercise: stretching (leg/arm exercises daily), Time (Minutes): 20, Frequency (Times/Week): 7, Weekly Exercise (Minutes/Week): 140, Intensity: [facility] weekly   Meals on wheels Daughter assist; healthy diet Good fluid intake  Goals    . Follow up with Primary Care Provider as needed      Depression Screen PHQ 2/9 Scores 07/15/2020 07/12/2019 07/06/2018 07/05/2017 03/02/2017 10/19/2016 07/11/2015  PHQ - 2 Score 0 0 0 0 0 0 0  PHQ- 9 Score - - - 0 - - -    Fall Risk Fall Risk  07/15/2020 07/12/2019 07/06/2018 07/05/2017 03/02/2017  Falls in the past year? 0 1 0 Yes No  Number falls in past yr: 0 - - 1 -  Injury with Fall? 0 - - No -  Follow up Falls evaluation completed Falls prevention discussed - - -    FALL RISK PREVENTION PERTAINING TO THE HOME: Handrails in use when climbing stairs? Yes Home free of loose throw rugs in walkways, pet beds, electrical cords, etc? Yes  Adequate lighting in your home to reduce risk of falls? Yes   ASSISTIVE DEVICES UTILIZED  TO PREVENT FALLS: Use of a cane, walker or w/c? Yes  Grab bars in the bathroom? Yes  Shower chair or bench  in shower? Yes   TIMED UP AND GO: Was the test performed? No . Virtual visit.  Cognitive Function:  Patient is alert. Enjoys playing games, word search and socializing with family.  Age appropriate changes in memory.    6CIT Screen 07/06/2018 07/05/2017  What Year? 0 points 0 points  What month? 0 points 0 points  What time? 0 points 0 points  Count back from 20 0 points 0 points  Months in reverse 0 points 0 points  Repeat phrase 0 points 0 points  Total Score 0 0    Immunizations Immunization History  Administered Date(s) Administered  . DTaP 03/21/2012  . Fluad Quad(high Dose 65+) 04/19/2019, 06/12/2020  . Influenza Split 03/21/2012, 04/03/2014  . Influenza, High Dose Seasonal PF 04/30/2017, 05/02/2018  . Influenza-Unspecified 04/09/2015, 04/14/2016, 04/28/2017  . PFIZER SARS-COV-2 Vaccination 08/10/2019, 08/31/2019  . Pneumococcal Conjugate-13 07/09/2014  . Pneumococcal Polysaccharide-23 07/09/2012  . Tdap 02/16/2019   Health Maintenance Health Maintenance  Topic Date Due  . COVID-19 Vaccine (3 - Booster for Pfizer series) 02/28/2020  . URINE MICROALBUMIN  07/24/2020  . DEXA SCAN  10/14/2020 (Originally 03/06/1986)  . HEMOGLOBIN A1C  12/10/2020  . OPHTHALMOLOGY EXAM  03/03/2021  . FOOT EXAM  07/02/2021  . TETANUS/TDAP  02/15/2029  . INFLUENZA VACCINE  Completed  . PNA vac Low Risk Adult  Completed  . MAMMOGRAM  Discontinued   Colorectal cancer screening: No longer required.   Mammogram- discontinued 2018.  Covid Booster- not yet completed.   Lung Cancer Screening: (Low Dose CT Chest recommended if Age 44-80 years, 30 pack-year currently smoking OR have quit w/in 15years.) does not qualify.   Hepatitis C Screening: does not qualify.  Vision Screening: Recommended annual ophthalmology exams for early detection of glaucoma and other disorders of the  eye. Is the patient up to date with their annual eye exam?  Yes  Who is the provider or what is the name of the office in which the patient attends annual eye exams? Dr. Johnn Hai.   Dental Screening: Recommended annual dental exams for proper oral hygiene.   Community Resource Referral / Chronic Care Management: CRR required this visit?  No   CCM required this visit?  No      Plan:   Keep all routine maintenance appointments.   Next scheduled lab 08/05/20 @ 11:00  Follow up  10/14/20 @ 9:00  I have personally reviewed and noted the following in the patient's chart:   . Medical and social history . Use of alcohol, tobacco or illicit drugs  . Current medications and supplements . Functional ability and status . Nutritional status . Physical activity . Advanced directives . List of other physicians . Hospitalizations, surgeries, and ER visits in previous 12 months . Vitals . Screenings to include cognitive, depression, and falls . Referrals and appointments  In addition, I have reviewed and discussed with patient certain preventive protocols, quality metrics, and best practice recommendations. A written personalized care plan for preventive services as well as general preventive health recommendations were provided to patient via mychart.     Ashok Pall, LPN   [id number]

## 2020-07-15 NOTE — Patient Instructions (Addendum)
Angela Patterson , Thank you for taking time to come for your Medicare Wellness Visit. I appreciate your ongoing commitment to your health goals. Please review the following plan we discussed and let me know if I can assist you in the future.   These are the goals we discussed: Goals    . Follow up with Primary Care Provider as needed       This is a list of the screening recommended for you and due dates:  Health Maintenance  Topic Date Due  . COVID-19 Vaccine (3 - Booster for Pfizer series) 02/28/2020  . Urine Protein Check  07/24/2020  . DEXA scan (bone density measurement)  10/14/2020*  . Hemoglobin A1C  12/10/2020  . Eye exam for diabetics  03/03/2021  . Complete foot exam   07/02/2021  . Tetanus Vaccine  02/15/2029  . Flu Shot  Completed  . Pneumonia vaccines  Completed  . Mammogram  Discontinued  *Topic was postponed. The date shown is not the original due date.     Immunizations Immunization History  Administered Date(s) Administered  . DTaP 03/21/2012  . Fluad Quad(high Dose 65+) 04/19/2019, 06/12/2020  . Influenza Split 03/21/2012, 04/03/2014  . Influenza, High Dose Seasonal PF 04/30/2017, 05/02/2018  . Influenza-Unspecified 04/09/2015, 04/14/2016, 04/28/2017  . PFIZER SARS-COV-2 Vaccination 08/10/2019, 08/31/2019  . Pneumococcal Conjugate-13 07/09/2014  . Pneumococcal Polysaccharide-23 07/09/2012  . Tdap 02/16/2019   Keep all routine maintenance appointments.   Next scheduled lab 08/05/20 @ 11:00  Follow up  10/14/20 @ 9:00  Advanced directives: End of life planning; Advance aging; Advanced directives discussed.  Copy of current HCPOA/Living Will requested.    Conditions/risks identified: none new  Follow up in one year for your annual wellness visit.   Preventive Care 4 Years and Older, Female Preventive care refers to lifestyle choices and visits with your health care provider that can promote health and wellness. What does preventive care include?  A  yearly physical exam. This is also called an annual well check.  Dental exams once or twice a year.  Routine eye exams. Ask your health care provider how often you should have your eyes checked.  Personal lifestyle choices, including:  Daily care of your teeth and gums.  Regular physical activity.  Eating a healthy diet.  Avoiding tobacco and drug use.  Limiting alcohol use.  Practicing safe sex.  Taking low-dose aspirin every day.  Taking vitamin and mineral supplements as recommended by your health care provider. What happens during an annual well check? The services and screenings done by your health care provider during your annual well check will depend on your age, overall health, lifestyle risk factors, and family history of disease. Counseling  Your health care provider may ask you questions about your:  Alcohol use.  Tobacco use.  Drug use.  Emotional well-being.  Home and relationship well-being.  Sexual activity.  Eating habits.  History of falls.  Memory and ability to understand (cognition).  Work and work Astronomer.  Reproductive health. Screening  You may have the following tests or measurements:  Height, weight, and BMI.  Blood pressure.  Lipid and cholesterol levels. These may be checked every 5 years, or more frequently if you are over 18 years old.  Skin check.  Lung cancer screening. You may have this screening every year starting at age 57 if you have a 30-pack-year history of smoking and currently smoke or have quit within the past 15 years.  Fecal occult blood test (FOBT)  of the stool. You may have this test every year starting at age 55.  Flexible sigmoidoscopy or colonoscopy. You may have a sigmoidoscopy every 5 years or a colonoscopy every 10 years starting at age 39.  Hepatitis C blood test.  Hepatitis B blood test.  Sexually transmitted disease (STD) testing.  Diabetes screening. This is done by checking your blood  sugar (glucose) after you have not eaten for a while (fasting). You may have this done every 1-3 years.  Bone density scan. This is done to screen for osteoporosis. You may have this done starting at age 73.  Mammogram. This may be done every 1-2 years. Talk to your health care provider about how often you should have regular mammograms. Talk with your health care provider about your test results, treatment options, and if necessary, the need for more tests. Vaccines  Your health care provider may recommend certain vaccines, such as:  Influenza vaccine. This is recommended every year.  Tetanus, diphtheria, and acellular pertussis (Tdap, Td) vaccine. You may need a Td booster every 10 years.  Zoster vaccine. You may need this after age 60.  Pneumococcal 13-valent conjugate (PCV13) vaccine. One dose is recommended after age 76.  Pneumococcal polysaccharide (PPSV23) vaccine. One dose is recommended after age 41. Talk to your health care provider about which screenings and vaccines you need and how often you need them. This information is not intended to replace advice given to you by your health care provider. Make sure you discuss any questions you have with your health care provider. Document Released: 08/02/2015 Document Revised: 03/25/2016 Document Reviewed: 05/07/2015 Elsevier Interactive Patient Education  2017 [employer] Prevention in the Home Falls can cause injuries. They can happen to people of all ages. There are many things you can do to make your home safe and to help prevent falls. What can I do on the outside of my home?  Regularly fix the edges of walkways and driveways and fix any cracks.  Remove anything that might make you trip as you walk through a door, such as a raised step or threshold.  Trim any bushes or trees on the path to your home.  Use bright outdoor lighting.  Clear any walking paths of anything that might make someone trip, such as rocks or  tools.  Regularly check to see if handrails are loose or broken. Make sure that both sides of any steps have handrails.  Any raised decks and porches should have guardrails on the edges.  Have any leaves, snow, or ice cleared regularly.  Use sand or salt on walking paths during winter.  Clean up any spills in your garage right away. This includes oil or grease spills. What can I do in the bathroom?  Use night lights.  Install grab bars by the toilet and in the tub and shower. Do not use towel bars as grab bars.  Use non-skid mats or decals in the tub or shower.  If you need to sit down in the shower, use a plastic, non-slip stool.  Keep the floor dry. Clean up any water that spills on the floor as soon as it happens.  Remove soap buildup in the tub or shower regularly.  Attach bath mats securely with double-sided non-slip rug tape.  Do not have throw rugs and other things on the floor that can make you trip. What can I do in the bedroom?  Use night lights.  Make sure that you have a light by  your bed that is easy to reach.  Do not use any sheets or blankets that are too big for your bed. They should not hang down onto the floor.  Have a firm chair that has side arms. You can use this for support while you get dressed.  Do not have throw rugs and other things on the floor that can make you trip. What can I do in the kitchen?  Clean up any spills right away.  Avoid walking on wet floors.  Keep items that you use a lot in easy-to-reach places.  If you need to reach something above you, use a strong step stool that has a grab bar.  Keep electrical cords out of the way.  Do not use floor polish or wax that makes floors slippery. If you must use wax, use non-skid floor wax.  Do not have throw rugs and other things on the floor that can make you trip. What can I do with my stairs?  Do not leave any items on the stairs.  Make sure that there are handrails on both  sides of the stairs and use them. Fix handrails that are broken or loose. Make sure that handrails are as long as the stairways.  Check any carpeting to make sure that it is firmly attached to the stairs. Fix any carpet that is loose or worn.  Avoid having throw rugs at the top or bottom of the stairs. If you do have throw rugs, attach them to the floor with carpet tape.  Make sure that you have a light switch at the top of the stairs and the bottom of the stairs. If you do not have them, ask someone to add them for you. What else can I do to help prevent falls?  Wear shoes that:  Do not have high heels.  Have rubber bottoms.  Are comfortable and fit you well.  Are closed at the toe. Do not wear sandals.  If you use a stepladder:  Make sure that it is fully opened. Do not climb a closed stepladder.  Make sure that both sides of the stepladder are locked into place.  Ask someone to hold it for you, if possible.  Clearly mark and make sure that you can see:  Any grab bars or handrails.  First and last steps.  Where the edge of each step is.  Use tools that help you move around (mobility aids) if they are needed. These include:  Canes.  Walkers.  Scooters.  Crutches.  Turn on the lights when you go into a dark area. Replace any light bulbs as soon as they burn out.  Set up your furniture so you have a clear path. Avoid moving your furniture around.  If any of your floors are uneven, fix them.  If there are any pets around you, be aware of where they are.  Review your medicines with your doctor. Some medicines can make you feel dizzy. This can increase your chance of falling. Ask your doctor what other things that you can do to help prevent falls. This information is not intended to replace advice given to you by your health care provider. Make sure you discuss any questions you have with your health care provider. Document Released: 05/02/2009 Document Revised:  12/12/2015 Document Reviewed: 08/10/2014 Elsevier Interactive Patient Education  2017 [employer].

## 2020-07-17 DIAGNOSIS — L97522 Non-pressure chronic ulcer of other part of left foot with fat layer exposed: Secondary | ICD-10-CM | POA: Diagnosis not present

## 2020-07-17 DIAGNOSIS — I10 Essential (primary) hypertension: Secondary | ICD-10-CM | POA: Diagnosis not present

## 2020-07-17 DIAGNOSIS — Z7984 Long term (current) use of oral hypoglycemic drugs: Secondary | ICD-10-CM | POA: Diagnosis not present

## 2020-07-17 DIAGNOSIS — E11621 Type 2 diabetes mellitus with foot ulcer: Secondary | ICD-10-CM | POA: Diagnosis not present

## 2020-07-17 DIAGNOSIS — Z4801 Encounter for change or removal of surgical wound dressing: Secondary | ICD-10-CM | POA: Diagnosis not present

## 2020-07-17 DIAGNOSIS — E1140 Type 2 diabetes mellitus with diabetic neuropathy, unspecified: Secondary | ICD-10-CM | POA: Diagnosis not present

## 2020-07-18 ENCOUNTER — Encounter: Payer: Self-pay | Admitting: Internal Medicine

## 2020-07-26 DIAGNOSIS — L97522 Non-pressure chronic ulcer of other part of left foot with fat layer exposed: Secondary | ICD-10-CM | POA: Diagnosis not present

## 2020-07-26 DIAGNOSIS — E11621 Type 2 diabetes mellitus with foot ulcer: Secondary | ICD-10-CM | POA: Diagnosis not present

## 2020-07-26 DIAGNOSIS — Z7984 Long term (current) use of oral hypoglycemic drugs: Secondary | ICD-10-CM | POA: Diagnosis not present

## 2020-07-26 DIAGNOSIS — I10 Essential (primary) hypertension: Secondary | ICD-10-CM | POA: Diagnosis not present

## 2020-07-26 DIAGNOSIS — E1140 Type 2 diabetes mellitus with diabetic neuropathy, unspecified: Secondary | ICD-10-CM | POA: Diagnosis not present

## 2020-07-26 DIAGNOSIS — Z4801 Encounter for change or removal of surgical wound dressing: Secondary | ICD-10-CM | POA: Diagnosis not present

## 2020-07-29 DIAGNOSIS — E1140 Type 2 diabetes mellitus with diabetic neuropathy, unspecified: Secondary | ICD-10-CM | POA: Diagnosis not present

## 2020-07-29 DIAGNOSIS — Z7984 Long term (current) use of oral hypoglycemic drugs: Secondary | ICD-10-CM | POA: Diagnosis not present

## 2020-07-29 DIAGNOSIS — L97522 Non-pressure chronic ulcer of other part of left foot with fat layer exposed: Secondary | ICD-10-CM | POA: Diagnosis not present

## 2020-07-29 DIAGNOSIS — I10 Essential (primary) hypertension: Secondary | ICD-10-CM | POA: Diagnosis not present

## 2020-07-29 DIAGNOSIS — E11621 Type 2 diabetes mellitus with foot ulcer: Secondary | ICD-10-CM | POA: Diagnosis not present

## 2020-07-29 DIAGNOSIS — Z4801 Encounter for change or removal of surgical wound dressing: Secondary | ICD-10-CM | POA: Diagnosis not present

## 2020-07-30 ENCOUNTER — Ambulatory Visit (INDEPENDENT_AMBULATORY_CARE_PROVIDER_SITE_OTHER): Payer: Medicare Other | Admitting: Podiatry

## 2020-07-30 ENCOUNTER — Other Ambulatory Visit: Payer: Self-pay

## 2020-07-30 DIAGNOSIS — L97522 Non-pressure chronic ulcer of other part of left foot with fat layer exposed: Secondary | ICD-10-CM

## 2020-07-30 DIAGNOSIS — E0843 Diabetes mellitus due to underlying condition with diabetic autonomic (poly)neuropathy: Secondary | ICD-10-CM

## 2020-07-30 DIAGNOSIS — B351 Tinea unguium: Secondary | ICD-10-CM | POA: Diagnosis not present

## 2020-07-30 DIAGNOSIS — M79676 Pain in unspecified toe(s): Secondary | ICD-10-CM

## 2020-07-30 DIAGNOSIS — I70245 Atherosclerosis of native arteries of left leg with ulceration of other part of foot: Secondary | ICD-10-CM

## 2020-07-30 NOTE — Progress Notes (Signed)
   Subjective:  87 year old female with PMHx of DM presenting today for follow up evaluation of an ulceration of the left foot.  Patient also complains of thickened elongated nails bilateral.  She would like to have her nails trimmed today.  Past Medical History:  Diagnosis Date  . Allergy   . Chicken pox   . CVA (cerebral vascular accident) ([place])   . Diabetes mellitus ([place])   . Hypercholesterolemia   . Hypertension       Objective/Physical Exam General: The patient is alert and oriented x3 in no acute distress.  Dermatology:  Wound #1 noted to the sub-first MPJ of the left foot has improved significantly.  It now measures approximately 1.0x 2.0x0.2 cm.  Overall it looks like the wound has increased in size slightly.  To the noted ulceration(s), there is no eschar. There is a moderate amount of slough, fibrin, and necrotic tissue noted. Granulation tissue and wound base is red. There is a minimal amount of serosanguineous drainage noted. There is no exposed bone muscle-tendon ligament or joint. There is no malodor. Periwound integrity is intact. Skin is warm, dry and supple bilateral lower extremities.  Hyperkeratotic thickened elongated discolored dystrophic nails noted 1-5 bilateral  Vascular: Palpable pedal pulses bilaterally.  Erythema resolved. Capillary refill within normal limits.  Neurological: Epicritic and protective threshold diminished bilaterally.   Musculoskeletal Exam: Range of motion within normal limits to all pedal and ankle joints bilateral. Muscle strength 5/5 in all groups bilateral.   Assessment: #1 ulceration of the sub-first MPJ of the left foot secondary to diabetes mellitus #2 diabetes mellitus w/ peripheral neuropathy #3 pain due to onychomycosis of toenails bilateral  Plan of Care:  #1 Patient was evaluated. #2 Medically necessary excisional debridement including subcutaneous tissue was performed using a tissue nipper and a chisel blade. Excisional  debridement of all the necrotic nonviable tissue down to healthy bleeding viable tissue was performed with post-debridement measurements same as pre-. #3  Patient has been applying Silvadene cream daily.  Continue.   #4 mechanical debridement of nails 1-5 bilateral was performed using a nail nipper without incident or bleeding #5 return to clinic in 4 weeks   [name], DPM [facility]  Dr. [name], DPM    [address], [place] [address]                Office [phone]  Fax [phone]

## 2020-08-02 DIAGNOSIS — E11621 Type 2 diabetes mellitus with foot ulcer: Secondary | ICD-10-CM | POA: Diagnosis not present

## 2020-08-02 DIAGNOSIS — Z4801 Encounter for change or removal of surgical wound dressing: Secondary | ICD-10-CM | POA: Diagnosis not present

## 2020-08-02 DIAGNOSIS — I10 Essential (primary) hypertension: Secondary | ICD-10-CM | POA: Diagnosis not present

## 2020-08-02 DIAGNOSIS — L97522 Non-pressure chronic ulcer of other part of left foot with fat layer exposed: Secondary | ICD-10-CM | POA: Diagnosis not present

## 2020-08-02 DIAGNOSIS — E1140 Type 2 diabetes mellitus with diabetic neuropathy, unspecified: Secondary | ICD-10-CM | POA: Diagnosis not present

## 2020-08-02 DIAGNOSIS — Z7984 Long term (current) use of oral hypoglycemic drugs: Secondary | ICD-10-CM | POA: Diagnosis not present

## 2020-08-05 ENCOUNTER — Other Ambulatory Visit: Payer: Medicare Other

## 2020-08-08 DIAGNOSIS — Z7984 Long term (current) use of oral hypoglycemic drugs: Secondary | ICD-10-CM | POA: Diagnosis not present

## 2020-08-08 DIAGNOSIS — Z8673 Personal history of transient ischemic attack (TIA), and cerebral infarction without residual deficits: Secondary | ICD-10-CM | POA: Diagnosis not present

## 2020-08-08 DIAGNOSIS — Z741 Need for assistance with personal care: Secondary | ICD-10-CM | POA: Diagnosis not present

## 2020-08-08 DIAGNOSIS — R531 Weakness: Secondary | ICD-10-CM | POA: Diagnosis not present

## 2020-08-08 DIAGNOSIS — E11621 Type 2 diabetes mellitus with foot ulcer: Secondary | ICD-10-CM | POA: Diagnosis not present

## 2020-08-08 DIAGNOSIS — I10 Essential (primary) hypertension: Secondary | ICD-10-CM | POA: Diagnosis not present

## 2020-08-08 DIAGNOSIS — F419 Anxiety disorder, unspecified: Secondary | ICD-10-CM | POA: Diagnosis not present

## 2020-08-08 DIAGNOSIS — Z4801 Encounter for change or removal of surgical wound dressing: Secondary | ICD-10-CM | POA: Diagnosis not present

## 2020-08-08 DIAGNOSIS — E1140 Type 2 diabetes mellitus with diabetic neuropathy, unspecified: Secondary | ICD-10-CM | POA: Diagnosis not present

## 2020-08-08 DIAGNOSIS — L97522 Non-pressure chronic ulcer of other part of left foot with fat layer exposed: Secondary | ICD-10-CM | POA: Diagnosis not present

## 2020-08-09 DIAGNOSIS — I10 Essential (primary) hypertension: Secondary | ICD-10-CM | POA: Diagnosis not present

## 2020-08-09 DIAGNOSIS — Z4801 Encounter for change or removal of surgical wound dressing: Secondary | ICD-10-CM | POA: Diagnosis not present

## 2020-08-09 DIAGNOSIS — E1140 Type 2 diabetes mellitus with diabetic neuropathy, unspecified: Secondary | ICD-10-CM | POA: Diagnosis not present

## 2020-08-09 DIAGNOSIS — E11621 Type 2 diabetes mellitus with foot ulcer: Secondary | ICD-10-CM | POA: Diagnosis not present

## 2020-08-09 DIAGNOSIS — Z7984 Long term (current) use of oral hypoglycemic drugs: Secondary | ICD-10-CM | POA: Diagnosis not present

## 2020-08-09 DIAGNOSIS — L97522 Non-pressure chronic ulcer of other part of left foot with fat layer exposed: Secondary | ICD-10-CM | POA: Diagnosis not present

## 2020-08-12 ENCOUNTER — Telehealth: Payer: Self-pay | Admitting: *Deleted

## 2020-08-12 DIAGNOSIS — E1140 Type 2 diabetes mellitus with diabetic neuropathy, unspecified: Secondary | ICD-10-CM

## 2020-08-12 DIAGNOSIS — E7800 Pure hypercholesterolemia, unspecified: Secondary | ICD-10-CM

## 2020-08-12 NOTE — Telephone Encounter (Signed)
Please place future orders for lab appt.  

## 2020-08-12 NOTE — Telephone Encounter (Signed)
Orders placed for f/u labs.  

## 2020-08-14 ENCOUNTER — Other Ambulatory Visit: Payer: Self-pay

## 2020-08-14 ENCOUNTER — Other Ambulatory Visit (INDEPENDENT_AMBULATORY_CARE_PROVIDER_SITE_OTHER): Payer: Medicare Other

## 2020-08-14 DIAGNOSIS — E7800 Pure hypercholesterolemia, unspecified: Secondary | ICD-10-CM | POA: Diagnosis not present

## 2020-08-14 DIAGNOSIS — Z4801 Encounter for change or removal of surgical wound dressing: Secondary | ICD-10-CM | POA: Diagnosis not present

## 2020-08-14 DIAGNOSIS — E1140 Type 2 diabetes mellitus with diabetic neuropathy, unspecified: Secondary | ICD-10-CM | POA: Diagnosis not present

## 2020-08-14 DIAGNOSIS — L97522 Non-pressure chronic ulcer of other part of left foot with fat layer exposed: Secondary | ICD-10-CM | POA: Diagnosis not present

## 2020-08-14 DIAGNOSIS — Z7984 Long term (current) use of oral hypoglycemic drugs: Secondary | ICD-10-CM | POA: Diagnosis not present

## 2020-08-14 DIAGNOSIS — E11621 Type 2 diabetes mellitus with foot ulcer: Secondary | ICD-10-CM | POA: Diagnosis not present

## 2020-08-14 DIAGNOSIS — I10 Essential (primary) hypertension: Secondary | ICD-10-CM | POA: Diagnosis not present

## 2020-08-14 LAB — BASIC METABOLIC PANEL
BUN: 22 mg/dL (ref 6–23)
CO2: 31 mEq/L (ref 19–32)
Calcium: 10.1 mg/dL (ref 8.4–10.5)
Chloride: 98 mEq/L (ref 96–112)
Creatinine, Ser: 0.7 mg/dL (ref 0.40–1.20)
GFR: 72.48 mL/min (ref >60.00)
Glucose, Bld: 230 mg/dL — ABNORMAL HIGH (ref 70–99)
Potassium: 4.4 mEq/L (ref 3.5–5.1)
Sodium: 136 mEq/L (ref 135–145)

## 2020-08-14 LAB — HEPATIC FUNCTION PANEL
ALT: 15 U/L (ref 0–35)
AST: 19 U/L (ref 0–37)
Albumin: 4.2 g/dL (ref 3.5–5.2)
Alkaline Phosphatase: 66 U/L (ref 39–117)
Bilirubin, Direct: 0.1 mg/dL (ref 0.0–0.3)
Total Bilirubin: 0.7 mg/dL (ref 0.2–1.2)
Total Protein: 7.5 g/dL (ref 6.0–8.3)

## 2020-08-16 DIAGNOSIS — E11621 Type 2 diabetes mellitus with foot ulcer: Secondary | ICD-10-CM | POA: Diagnosis not present

## 2020-08-16 DIAGNOSIS — L97522 Non-pressure chronic ulcer of other part of left foot with fat layer exposed: Secondary | ICD-10-CM | POA: Diagnosis not present

## 2020-08-16 DIAGNOSIS — I10 Essential (primary) hypertension: Secondary | ICD-10-CM | POA: Diagnosis not present

## 2020-08-16 DIAGNOSIS — Z4801 Encounter for change or removal of surgical wound dressing: Secondary | ICD-10-CM | POA: Diagnosis not present

## 2020-08-16 DIAGNOSIS — Z7984 Long term (current) use of oral hypoglycemic drugs: Secondary | ICD-10-CM | POA: Diagnosis not present

## 2020-08-16 DIAGNOSIS — E1140 Type 2 diabetes mellitus with diabetic neuropathy, unspecified: Secondary | ICD-10-CM | POA: Diagnosis not present

## 2020-08-23 DIAGNOSIS — Z4801 Encounter for change or removal of surgical wound dressing: Secondary | ICD-10-CM | POA: Diagnosis not present

## 2020-08-23 DIAGNOSIS — I10 Essential (primary) hypertension: Secondary | ICD-10-CM | POA: Diagnosis not present

## 2020-08-23 DIAGNOSIS — Z7984 Long term (current) use of oral hypoglycemic drugs: Secondary | ICD-10-CM | POA: Diagnosis not present

## 2020-08-23 DIAGNOSIS — E11621 Type 2 diabetes mellitus with foot ulcer: Secondary | ICD-10-CM | POA: Diagnosis not present

## 2020-08-23 DIAGNOSIS — L97522 Non-pressure chronic ulcer of other part of left foot with fat layer exposed: Secondary | ICD-10-CM | POA: Diagnosis not present

## 2020-08-23 DIAGNOSIS — E1140 Type 2 diabetes mellitus with diabetic neuropathy, unspecified: Secondary | ICD-10-CM | POA: Diagnosis not present

## 2020-08-26 DIAGNOSIS — E11621 Type 2 diabetes mellitus with foot ulcer: Secondary | ICD-10-CM | POA: Diagnosis not present

## 2020-08-26 DIAGNOSIS — E1140 Type 2 diabetes mellitus with diabetic neuropathy, unspecified: Secondary | ICD-10-CM | POA: Diagnosis not present

## 2020-08-26 DIAGNOSIS — L97522 Non-pressure chronic ulcer of other part of left foot with fat layer exposed: Secondary | ICD-10-CM | POA: Diagnosis not present

## 2020-08-26 DIAGNOSIS — Z4801 Encounter for change or removal of surgical wound dressing: Secondary | ICD-10-CM | POA: Diagnosis not present

## 2020-08-26 DIAGNOSIS — I10 Essential (primary) hypertension: Secondary | ICD-10-CM | POA: Diagnosis not present

## 2020-08-26 DIAGNOSIS — Z7984 Long term (current) use of oral hypoglycemic drugs: Secondary | ICD-10-CM | POA: Diagnosis not present

## 2020-08-27 ENCOUNTER — Ambulatory Visit (INDEPENDENT_AMBULATORY_CARE_PROVIDER_SITE_OTHER): Payer: Medicare Other | Admitting: Podiatry

## 2020-08-27 ENCOUNTER — Other Ambulatory Visit: Payer: Self-pay

## 2020-08-27 DIAGNOSIS — L97522 Non-pressure chronic ulcer of other part of left foot with fat layer exposed: Secondary | ICD-10-CM

## 2020-08-27 DIAGNOSIS — E0843 Diabetes mellitus due to underlying condition with diabetic autonomic (poly)neuropathy: Secondary | ICD-10-CM

## 2020-08-27 NOTE — Progress Notes (Signed)
   Subjective:  87 year old female with PMHx of DM presenting today for follow up evaluation of an ulceration of the left foot.  Has been presenting to the office for routine conservative management of the wound.  Both the patient and her family have opted for conservative management and to avoid any surgical intervention to help alleviate any symptoms the patient may have.  They present today for routine treatment Past Medical History:  Diagnosis Date  . Allergy   . Chicken pox   . CVA (cerebral vascular accident) ([place])   . Diabetes mellitus ([place])   . Hypercholesterolemia   . Hypertension       Objective/Physical Exam General: The patient is alert and oriented x3 in no acute distress.  Dermatology:  Wound #1 noted to the sub-first MPJ of the left foot has improved significantly.  It now measures approximately 1.0x 2.0x0.2 cm.  Overall it looks like the wound has increased in size slightly.  To the noted ulceration(s), there is no eschar. There is a moderate amount of slough, fibrin, and necrotic tissue noted. Granulation tissue and wound base is red. There is a minimal amount of serosanguineous drainage noted. There is no exposed bone muscle-tendon ligament or joint. There is no malodor. Periwound integrity is intact. Skin is warm, dry and supple bilateral lower extremities.  Hyperkeratotic thickened elongated discolored dystrophic nails noted 1-5 bilateral  Vascular: Palpable pedal pulses bilaterally.  Erythema resolved. Capillary refill within normal limits.  Neurological: Epicritic and protective threshold diminished bilaterally.   Musculoskeletal Exam: Range of motion within normal limits to all pedal and ankle joints bilateral. Muscle strength 5/5 in all groups bilateral.   Assessment: #1 ulceration of the sub-first MPJ of the left foot secondary to diabetes mellitus #2 diabetes mellitus w/ peripheral neuropathy  Plan of Care:  #1 Patient was evaluated. #2 Medically necessary  excisional debridement including subcutaneous tissue was performed using a tissue nipper and a chisel blade. Excisional debridement of all the necrotic nonviable tissue down to healthy bleeding viable tissue was performed with post-debridement measurements same as pre-. #3  Patient has been applying Silvadene cream daily.  Continue.   #4 return to clinic in 4 weeks   [name], DPM [facility]  Dr. [name], DPM    [address], [place] [address]                Office [phone]  Fax [phone]

## 2020-08-28 ENCOUNTER — Telehealth: Payer: Self-pay | Admitting: Internal Medicine

## 2020-08-28 NOTE — Telephone Encounter (Signed)
Rejection Reason - Patient Declined - pt was scheduled and called in to cancel" Cindi Carbon said on Aug 28, 2020 2:43 PM  [facility] neurology

## 2020-09-04 DIAGNOSIS — E11621 Type 2 diabetes mellitus with foot ulcer: Secondary | ICD-10-CM | POA: Diagnosis not present

## 2020-09-04 DIAGNOSIS — E1140 Type 2 diabetes mellitus with diabetic neuropathy, unspecified: Secondary | ICD-10-CM | POA: Diagnosis not present

## 2020-09-04 DIAGNOSIS — L97522 Non-pressure chronic ulcer of other part of left foot with fat layer exposed: Secondary | ICD-10-CM | POA: Diagnosis not present

## 2020-09-04 DIAGNOSIS — Z4801 Encounter for change or removal of surgical wound dressing: Secondary | ICD-10-CM | POA: Diagnosis not present

## 2020-09-04 DIAGNOSIS — I10 Essential (primary) hypertension: Secondary | ICD-10-CM | POA: Diagnosis not present

## 2020-09-04 DIAGNOSIS — Z7984 Long term (current) use of oral hypoglycemic drugs: Secondary | ICD-10-CM | POA: Diagnosis not present

## 2020-09-05 DIAGNOSIS — L97522 Non-pressure chronic ulcer of other part of left foot with fat layer exposed: Secondary | ICD-10-CM | POA: Diagnosis not present

## 2020-09-05 DIAGNOSIS — Z4801 Encounter for change or removal of surgical wound dressing: Secondary | ICD-10-CM | POA: Diagnosis not present

## 2020-09-05 DIAGNOSIS — Z7984 Long term (current) use of oral hypoglycemic drugs: Secondary | ICD-10-CM | POA: Diagnosis not present

## 2020-09-05 DIAGNOSIS — E11621 Type 2 diabetes mellitus with foot ulcer: Secondary | ICD-10-CM | POA: Diagnosis not present

## 2020-09-05 DIAGNOSIS — I10 Essential (primary) hypertension: Secondary | ICD-10-CM | POA: Diagnosis not present

## 2020-09-05 DIAGNOSIS — E1140 Type 2 diabetes mellitus with diabetic neuropathy, unspecified: Secondary | ICD-10-CM | POA: Diagnosis not present

## 2020-09-07 DIAGNOSIS — E1140 Type 2 diabetes mellitus with diabetic neuropathy, unspecified: Secondary | ICD-10-CM | POA: Diagnosis not present

## 2020-09-07 DIAGNOSIS — F419 Anxiety disorder, unspecified: Secondary | ICD-10-CM | POA: Diagnosis not present

## 2020-09-07 DIAGNOSIS — R531 Weakness: Secondary | ICD-10-CM | POA: Diagnosis not present

## 2020-09-07 DIAGNOSIS — E11621 Type 2 diabetes mellitus with foot ulcer: Secondary | ICD-10-CM | POA: Diagnosis not present

## 2020-09-07 DIAGNOSIS — Z8673 Personal history of transient ischemic attack (TIA), and cerebral infarction without residual deficits: Secondary | ICD-10-CM | POA: Diagnosis not present

## 2020-09-07 DIAGNOSIS — L97522 Non-pressure chronic ulcer of other part of left foot with fat layer exposed: Secondary | ICD-10-CM | POA: Diagnosis not present

## 2020-09-07 DIAGNOSIS — Z741 Need for assistance with personal care: Secondary | ICD-10-CM | POA: Diagnosis not present

## 2020-09-07 DIAGNOSIS — I10 Essential (primary) hypertension: Secondary | ICD-10-CM | POA: Diagnosis not present

## 2020-09-07 DIAGNOSIS — Z4801 Encounter for change or removal of surgical wound dressing: Secondary | ICD-10-CM | POA: Diagnosis not present

## 2020-09-07 DIAGNOSIS — Z7984 Long term (current) use of oral hypoglycemic drugs: Secondary | ICD-10-CM | POA: Diagnosis not present

## 2020-09-09 ENCOUNTER — Encounter: Payer: Self-pay | Admitting: Internal Medicine

## 2020-09-09 DIAGNOSIS — Z7984 Long term (current) use of oral hypoglycemic drugs: Secondary | ICD-10-CM | POA: Diagnosis not present

## 2020-09-09 DIAGNOSIS — L97522 Non-pressure chronic ulcer of other part of left foot with fat layer exposed: Secondary | ICD-10-CM | POA: Diagnosis not present

## 2020-09-09 DIAGNOSIS — Z4801 Encounter for change or removal of surgical wound dressing: Secondary | ICD-10-CM | POA: Diagnosis not present

## 2020-09-09 DIAGNOSIS — E1140 Type 2 diabetes mellitus with diabetic neuropathy, unspecified: Secondary | ICD-10-CM | POA: Diagnosis not present

## 2020-09-09 DIAGNOSIS — E11621 Type 2 diabetes mellitus with foot ulcer: Secondary | ICD-10-CM | POA: Diagnosis not present

## 2020-09-09 DIAGNOSIS — I10 Essential (primary) hypertension: Secondary | ICD-10-CM | POA: Diagnosis not present

## 2020-09-09 NOTE — Telephone Encounter (Signed)
Called Angela Patterson. Unable to leave message

## 2020-09-09 NOTE — Telephone Encounter (Signed)
Do agree with covid testing.  Need to confirm symptoms.  I do not mind seeing her, but need to confirm if weak - unable to eat, vomiting, sob, etc may need in person evaluation.

## 2020-09-09 NOTE — Telephone Encounter (Signed)
Spoke with daughter. [facility] nurse has came out and assessed. Lungs are clear. No fever No SOB. Appetite was great today. Not as weak. Daughter preferred to do virtual with Dr Nicki Reaper prior to covid testing. Daughter confirmed that pt has not been around anyone except for her and her daughter because they take care of her.

## 2020-09-10 ENCOUNTER — Telehealth (INDEPENDENT_AMBULATORY_CARE_PROVIDER_SITE_OTHER): Payer: Medicare Other | Admitting: Internal Medicine

## 2020-09-10 ENCOUNTER — Telehealth: Payer: Self-pay | Admitting: Internal Medicine

## 2020-09-10 DIAGNOSIS — E11621 Type 2 diabetes mellitus with foot ulcer: Secondary | ICD-10-CM | POA: Diagnosis not present

## 2020-09-10 DIAGNOSIS — I10 Essential (primary) hypertension: Secondary | ICD-10-CM | POA: Diagnosis not present

## 2020-09-10 DIAGNOSIS — R059 Cough, unspecified: Secondary | ICD-10-CM

## 2020-09-10 DIAGNOSIS — L97509 Non-pressure chronic ulcer of other part of unspecified foot with unspecified severity: Secondary | ICD-10-CM

## 2020-09-10 DIAGNOSIS — I70245 Atherosclerosis of native arteries of left leg with ulceration of other part of foot: Secondary | ICD-10-CM | POA: Diagnosis not present

## 2020-09-10 NOTE — Telephone Encounter (Signed)
My chart message sent for update.  

## 2020-09-10 NOTE — Progress Notes (Signed)
Patient ID: Angela Patterson, female   DOB: [birth year], 87 y.o.   MRN: [id number]   Virtual Visit via video Note  This visit type was conducted due to national recommendations for restrictions regarding the COVID-19 pandemic (e.g. social distancing).  This format is felt to be most appropriate for this patient at this time.  All issues noted in this document were discussed and addressed.  No physical exam was performed (except for noted visual exam findings with Video Visits).   I connected with Angela Patterson by a video enabled telemedicine application and verified that I am speaking with the correct person using two identifiers. Location patient: home Location provider: work Persons participating in the virtual visit: patient, provider, pts daughter [name] and pts granddaughter - [name]  The limitations, risks, security and privacy concerns of performing an evaluation and management service by video and the availability of in person appointments have been discussed.  It has also been discussed with the patient that there may be a patient responsible charge related to this service. The patient expressed understanding and agreed to proceed.   Reason for visit: work in appt  HPI: Work in for concerns regarding increased cough and congestion.  States symptoms started last Tuesday - runny nose.  Noticed weakness - legs shaking.  Some cough.  Eating.  Regular bowel movements.  No fever.  Reports feels some better today.  Some nasal congestion - clear mucus.  No sore throat.  Some cough.  No wheezing.  States nurse listened to her yesterday - lungs clear.  No diarrhea.  Blood sugar two hours after breakfast 251.  Strength is better. Previously had to push her around, but is better now.  Taking tylenol and mucinex. No sob reported. No chest pain reported.    ROS: See pertinent positives and negatives per HPI.  Past Medical History:  Diagnosis Date  . Allergy   . Chicken pox   . CVA (cerebral vascular  accident) ([place])   . Diabetes mellitus ([place])   . Hypercholesterolemia   . Hypertension     Past Surgical History:  Procedure Laterality Date  . ABDOMINAL HYSTERECTOMY  1971   excessive bleeding  . APPENDECTOMY  age 48  . BREAST LUMPECTOMY  1958   benign  . DILATION AND CURETTAGE OF UTERUS  1970    Family History  Problem Relation Age of Onset  . Liver cancer Father   . Stroke Mother   . Heart attack Brother   . Hypertension Daughter   . Hypertension Son   . Hypertension Daughter   . Cancer Grandchild        breast  . Diabetes Grandchild   . Breast cancer Neg Hx   . Colon cancer Neg Hx     SOCIAL HX: reviewed.    Current Outpatient Medications:  .  amLODipine (NORVASC) 5 MG tablet, TAKE 1 TABLET BY MOUTH  TWICE DAILY, Disp: 180 tablet, Rfl: 3 .  cefUROXime (CEFTIN) 250 MG tablet, Take 1 tablet (250 mg total) by mouth 2 (two) times daily with a meal., Disp: 10 tablet, Rfl: 0 .  Cholecalciferol (VITAMIN D) 2000 units tablet, Take 2,000 Units by mouth daily. , Disp: , Rfl:  .  clopidogrel (PLAVIX) 75 MG tablet, Take 1 tablet (75 mg total) by mouth daily., Disp: 90 tablet, Rfl: 1 .  glucose blood test strip, USE TO CHECK BLOOD SUGAR  TWO TIMES DAILY. Dx E11.9, Disp: 200 each, Rfl: 1 .  glucose blood test strip, OneTouch  Ultra Test strips, Disp: , Rfl:  .  hydrocortisone (PROCTOCORT) 1 % CREA, Apply thin layer around rectal opening 2 times per day to help hemorrhoids, Disp: 1 Tube, Rfl: 1 .  Lancet Devices (ONE TOUCH DELICA LANCING DEV) MISC, Use twice daily Dx: 250.00, Disp: 100 each, Rfl: 5 .  metFORMIN (GLUCOPHAGE-XR) 500 MG 24 hr tablet, Take 2 tablets in the am and on in the evening, Disp: 270 tablet, Rfl: 0 .  OMEGA-3 FATTY ACIDS PO, Take 720 mg by mouth daily., Disp: , Rfl:  .  rosuvastatin (CRESTOR) 5 MG tablet, Take 1 tablet (5 mg total) by mouth 2 (two) times a week., Disp: 26 tablet, Rfl: 0 .  sertraline (ZOLOFT) 25 MG tablet, TAKE 1 TO 2 TABLETS BY  MOUTH DAILY,  Disp: 180 tablet, Rfl: 1 .  timolol (BETIMOL) 0.5 % ophthalmic solution, 1 drop 2 (two) times daily., Disp: , Rfl:  .  timolol (TIMOPTIC) 0.5 % ophthalmic solution, , Disp: , Rfl:   EXAM:  VITALS per patient if applicable: 564/33, 29%  GENERAL: alert, oriented, appears well and in no acute distress  HEENT: atraumatic, conjunttiva clear, no obvious abnormalities on inspection of external nose and ears  NECK: normal movements of the head and neck  LUNGS: on inspection no signs of respiratory distress, breathing rate appears normal, no obvious gross SOB, gasping or wheezing. No increased cough with talking and attempts at forced expiration.   CV: no obvious cyanosis  PSYCH/NEURO: pleasant and cooperative, no obvious depression or anxiety, speech and thought processing grossly intact  ASSESSMENT AND PLAN:  Discussed the following assessment and plan:  Problem List Items Addressed This Visit    Cough    Cough and congestion as outlined.  Previous weakness - improved.  Symptoms overall improved.  Discussed possible etiologies, including covid, flu, other viral illnesses.  Symptoms started one week ago.  No chest pain or sob reported.  Energy improved.  Continue mucinex and tylenol.  nasacort nasal spray and delsym as directed.  Discussed covid testing.  They will notify me if desire testing.  Discussed quarantine.  Follow.  Call with update.       Hypertension    Blood pressure as outlined.  Continue amlodipine.  Follow pressures.  Follow metabolic panel.       Type 2 diabetes mellitus (HCC)    Tolerating metformin.  Sugar has been doing ok overall.  Follow.  No low sugars.           I discussed the assessment and treatment plan with the patient. The patient was provided an opportunity to ask questions and all were answered. The patient agreed with the plan and demonstrated an understanding of the instructions.   The patient was advised to call back or seek an in-person evaluation  if the symptoms worsen or if the condition fails to improve as anticipated.   Einar Pheasant, MD

## 2020-09-15 ENCOUNTER — Encounter: Payer: Self-pay | Admitting: Internal Medicine

## 2020-09-15 DIAGNOSIS — R059 Cough, unspecified: Secondary | ICD-10-CM | POA: Insufficient documentation

## 2020-09-15 NOTE — Assessment & Plan Note (Signed)
Blood pressure as outlined.  Continue amlodipine.   Follow pressures.  Follow metabolic panel.  

## 2020-09-15 NOTE — Assessment & Plan Note (Signed)
Cough and congestion as outlined.  Previous weakness - improved.  Symptoms overall improved.  Discussed possible etiologies, including covid, flu, other viral illnesses.  Symptoms started one week ago.  No chest pain or sob reported.  Energy improved.  Continue mucinex and tylenol.  nasacort nasal spray and delsym as directed.  Discussed covid testing.  They will notify me if desire testing.  Discussed quarantine.  Follow.  Call with update.

## 2020-09-15 NOTE — Assessment & Plan Note (Signed)
Tolerating metformin.  Sugar has been doing ok overall.  Follow.  No low sugars.

## 2020-09-18 DIAGNOSIS — Z4801 Encounter for change or removal of surgical wound dressing: Secondary | ICD-10-CM | POA: Diagnosis not present

## 2020-09-18 DIAGNOSIS — E11621 Type 2 diabetes mellitus with foot ulcer: Secondary | ICD-10-CM | POA: Diagnosis not present

## 2020-09-18 DIAGNOSIS — R531 Weakness: Secondary | ICD-10-CM | POA: Diagnosis not present

## 2020-09-18 DIAGNOSIS — Z7984 Long term (current) use of oral hypoglycemic drugs: Secondary | ICD-10-CM | POA: Diagnosis not present

## 2020-09-18 DIAGNOSIS — I10 Essential (primary) hypertension: Secondary | ICD-10-CM | POA: Diagnosis not present

## 2020-09-18 DIAGNOSIS — L97522 Non-pressure chronic ulcer of other part of left foot with fat layer exposed: Secondary | ICD-10-CM | POA: Diagnosis not present

## 2020-09-18 DIAGNOSIS — Z8673 Personal history of transient ischemic attack (TIA), and cerebral infarction without residual deficits: Secondary | ICD-10-CM | POA: Diagnosis not present

## 2020-09-18 DIAGNOSIS — E1140 Type 2 diabetes mellitus with diabetic neuropathy, unspecified: Secondary | ICD-10-CM | POA: Diagnosis not present

## 2020-09-18 DIAGNOSIS — Z741 Need for assistance with personal care: Secondary | ICD-10-CM | POA: Diagnosis not present

## 2020-09-18 DIAGNOSIS — F419 Anxiety disorder, unspecified: Secondary | ICD-10-CM | POA: Diagnosis not present

## 2020-09-19 DIAGNOSIS — Z7984 Long term (current) use of oral hypoglycemic drugs: Secondary | ICD-10-CM | POA: Diagnosis not present

## 2020-09-19 DIAGNOSIS — L97522 Non-pressure chronic ulcer of other part of left foot with fat layer exposed: Secondary | ICD-10-CM | POA: Diagnosis not present

## 2020-09-19 DIAGNOSIS — E1140 Type 2 diabetes mellitus with diabetic neuropathy, unspecified: Secondary | ICD-10-CM | POA: Diagnosis not present

## 2020-09-19 DIAGNOSIS — Z4801 Encounter for change or removal of surgical wound dressing: Secondary | ICD-10-CM | POA: Diagnosis not present

## 2020-09-19 DIAGNOSIS — I10 Essential (primary) hypertension: Secondary | ICD-10-CM | POA: Diagnosis not present

## 2020-09-19 DIAGNOSIS — E11621 Type 2 diabetes mellitus with foot ulcer: Secondary | ICD-10-CM | POA: Diagnosis not present

## 2020-09-23 ENCOUNTER — Other Ambulatory Visit: Payer: Self-pay | Admitting: Internal Medicine

## 2020-09-24 ENCOUNTER — Other Ambulatory Visit: Payer: Self-pay

## 2020-09-24 ENCOUNTER — Ambulatory Visit (INDEPENDENT_AMBULATORY_CARE_PROVIDER_SITE_OTHER): Payer: Medicare Other | Admitting: Podiatry

## 2020-09-24 DIAGNOSIS — L97522 Non-pressure chronic ulcer of other part of left foot with fat layer exposed: Secondary | ICD-10-CM | POA: Diagnosis not present

## 2020-09-24 DIAGNOSIS — E0843 Diabetes mellitus due to underlying condition with diabetic autonomic (poly)neuropathy: Secondary | ICD-10-CM | POA: Diagnosis not present

## 2020-09-24 NOTE — Progress Notes (Signed)
   Subjective:  87 year old female with PMHx of DM presenting today for follow up evaluation of an ulceration of the left foot.  Has been presenting to the office for routine conservative management of the wound.  Both the patient and her family have opted for conservative management and to avoid any surgical intervention to help alleviate any symptoms the patient may have.  They present today for routine treatment  Past Medical History:  Diagnosis Date  . Allergy   . Chicken pox   . CVA (cerebral vascular accident) ([place])   . Diabetes mellitus ([place])   . Hypercholesterolemia   . Hypertension       Objective/Physical Exam General: The patient is alert and oriented x3 in no acute distress.  Dermatology:  Today it appears that the wound to the plantar aspect of the foot has recently healed.  Complete reepithelialization has occurred.  The area is very callused however there is no open wound noted.   Skin is warm, dry and supple bilateral lower extremities.  Hyperkeratotic thickened elongated discolored dystrophic nails noted 1-5 bilateral  Vascular: Palpable pedal pulses bilaterally.  Erythema resolved. Capillary refill within normal limits.  Neurological: Epicritic and protective threshold diminished bilaterally.   Musculoskeletal Exam: Range of motion within normal limits to all pedal and ankle joints bilateral. Muscle strength 5/5 in all groups bilateral.   Assessment: #1 ulceration of the sub-first MPJ of the left foot secondary to diabetes mellitus #2 diabetes mellitus w/ peripheral neuropathy  Plan of Care:  #1 Patient was evaluated. #2  Light debridement was performed of the callus tissue around the recently healed wound area.  No significant bleeding noted. 3.  Recommend Silvadene cream daily 4.  Recommend lotion daily.  Lotion was provided today for the patient 5.  Continue diabetic shoes and insoles with offloading felt metatarsal pads  6.  Return to clinic in 6  weeks   [name], DPM [facility]  Dr. [name], DPM    [address], [place] [address]                Office [phone]  Fax [phone]

## 2020-09-25 DIAGNOSIS — Z4801 Encounter for change or removal of surgical wound dressing: Secondary | ICD-10-CM | POA: Diagnosis not present

## 2020-09-25 DIAGNOSIS — E11621 Type 2 diabetes mellitus with foot ulcer: Secondary | ICD-10-CM | POA: Diagnosis not present

## 2020-09-25 DIAGNOSIS — I10 Essential (primary) hypertension: Secondary | ICD-10-CM | POA: Diagnosis not present

## 2020-09-25 DIAGNOSIS — E1140 Type 2 diabetes mellitus with diabetic neuropathy, unspecified: Secondary | ICD-10-CM | POA: Diagnosis not present

## 2020-09-25 DIAGNOSIS — L97522 Non-pressure chronic ulcer of other part of left foot with fat layer exposed: Secondary | ICD-10-CM | POA: Diagnosis not present

## 2020-09-25 DIAGNOSIS — Z7984 Long term (current) use of oral hypoglycemic drugs: Secondary | ICD-10-CM | POA: Diagnosis not present

## 2020-09-27 DIAGNOSIS — E11621 Type 2 diabetes mellitus with foot ulcer: Secondary | ICD-10-CM | POA: Diagnosis not present

## 2020-09-27 DIAGNOSIS — E1140 Type 2 diabetes mellitus with diabetic neuropathy, unspecified: Secondary | ICD-10-CM | POA: Diagnosis not present

## 2020-09-27 DIAGNOSIS — L97522 Non-pressure chronic ulcer of other part of left foot with fat layer exposed: Secondary | ICD-10-CM | POA: Diagnosis not present

## 2020-09-27 DIAGNOSIS — Z4801 Encounter for change or removal of surgical wound dressing: Secondary | ICD-10-CM | POA: Diagnosis not present

## 2020-09-27 DIAGNOSIS — I10 Essential (primary) hypertension: Secondary | ICD-10-CM | POA: Diagnosis not present

## 2020-09-27 DIAGNOSIS — Z7984 Long term (current) use of oral hypoglycemic drugs: Secondary | ICD-10-CM | POA: Diagnosis not present

## 2020-09-30 ENCOUNTER — Encounter: Payer: Self-pay | Admitting: Internal Medicine

## 2020-10-01 ENCOUNTER — Other Ambulatory Visit: Payer: Self-pay | Admitting: Internal Medicine

## 2020-10-01 ENCOUNTER — Other Ambulatory Visit: Payer: Self-pay

## 2020-10-01 MED ORDER — ROSUVASTATIN CALCIUM 5 MG PO TABS: ORAL_TABLET | ORAL | 1 refills | Status: DC

## 2020-10-03 DIAGNOSIS — Z7984 Long term (current) use of oral hypoglycemic drugs: Secondary | ICD-10-CM | POA: Diagnosis not present

## 2020-10-03 DIAGNOSIS — E1140 Type 2 diabetes mellitus with diabetic neuropathy, unspecified: Secondary | ICD-10-CM | POA: Diagnosis not present

## 2020-10-03 DIAGNOSIS — E11621 Type 2 diabetes mellitus with foot ulcer: Secondary | ICD-10-CM | POA: Diagnosis not present

## 2020-10-03 DIAGNOSIS — Z4801 Encounter for change or removal of surgical wound dressing: Secondary | ICD-10-CM | POA: Diagnosis not present

## 2020-10-03 DIAGNOSIS — L97522 Non-pressure chronic ulcer of other part of left foot with fat layer exposed: Secondary | ICD-10-CM | POA: Diagnosis not present

## 2020-10-03 DIAGNOSIS — I10 Essential (primary) hypertension: Secondary | ICD-10-CM | POA: Diagnosis not present

## 2020-10-04 DIAGNOSIS — Z7984 Long term (current) use of oral hypoglycemic drugs: Secondary | ICD-10-CM | POA: Diagnosis not present

## 2020-10-04 DIAGNOSIS — I10 Essential (primary) hypertension: Secondary | ICD-10-CM | POA: Diagnosis not present

## 2020-10-04 DIAGNOSIS — Z4801 Encounter for change or removal of surgical wound dressing: Secondary | ICD-10-CM | POA: Diagnosis not present

## 2020-10-04 DIAGNOSIS — L97522 Non-pressure chronic ulcer of other part of left foot with fat layer exposed: Secondary | ICD-10-CM | POA: Diagnosis not present

## 2020-10-04 DIAGNOSIS — E1140 Type 2 diabetes mellitus with diabetic neuropathy, unspecified: Secondary | ICD-10-CM | POA: Diagnosis not present

## 2020-10-04 DIAGNOSIS — E11621 Type 2 diabetes mellitus with foot ulcer: Secondary | ICD-10-CM | POA: Diagnosis not present

## 2020-10-07 ENCOUNTER — Encounter: Payer: Self-pay | Admitting: Internal Medicine

## 2020-10-07 DIAGNOSIS — E11621 Type 2 diabetes mellitus with foot ulcer: Secondary | ICD-10-CM | POA: Diagnosis not present

## 2020-10-07 DIAGNOSIS — Z7984 Long term (current) use of oral hypoglycemic drugs: Secondary | ICD-10-CM | POA: Diagnosis not present

## 2020-10-07 DIAGNOSIS — E1140 Type 2 diabetes mellitus with diabetic neuropathy, unspecified: Secondary | ICD-10-CM | POA: Diagnosis not present

## 2020-10-07 DIAGNOSIS — F419 Anxiety disorder, unspecified: Secondary | ICD-10-CM | POA: Diagnosis not present

## 2020-10-07 DIAGNOSIS — Z8673 Personal history of transient ischemic attack (TIA), and cerebral infarction without residual deficits: Secondary | ICD-10-CM | POA: Diagnosis not present

## 2020-10-07 DIAGNOSIS — L97522 Non-pressure chronic ulcer of other part of left foot with fat layer exposed: Secondary | ICD-10-CM | POA: Diagnosis not present

## 2020-10-07 DIAGNOSIS — Z741 Need for assistance with personal care: Secondary | ICD-10-CM | POA: Diagnosis not present

## 2020-10-07 DIAGNOSIS — I10 Essential (primary) hypertension: Secondary | ICD-10-CM | POA: Diagnosis not present

## 2020-10-07 DIAGNOSIS — Z4801 Encounter for change or removal of surgical wound dressing: Secondary | ICD-10-CM | POA: Diagnosis not present

## 2020-10-07 DIAGNOSIS — R531 Weakness: Secondary | ICD-10-CM | POA: Diagnosis not present

## 2020-10-08 DIAGNOSIS — L97522 Non-pressure chronic ulcer of other part of left foot with fat layer exposed: Secondary | ICD-10-CM | POA: Diagnosis not present

## 2020-10-08 DIAGNOSIS — E1140 Type 2 diabetes mellitus with diabetic neuropathy, unspecified: Secondary | ICD-10-CM | POA: Diagnosis not present

## 2020-10-08 DIAGNOSIS — E11621 Type 2 diabetes mellitus with foot ulcer: Secondary | ICD-10-CM | POA: Diagnosis not present

## 2020-10-08 DIAGNOSIS — I10 Essential (primary) hypertension: Secondary | ICD-10-CM | POA: Diagnosis not present

## 2020-10-08 DIAGNOSIS — Z7984 Long term (current) use of oral hypoglycemic drugs: Secondary | ICD-10-CM | POA: Diagnosis not present

## 2020-10-08 DIAGNOSIS — Z4801 Encounter for change or removal of surgical wound dressing: Secondary | ICD-10-CM | POA: Diagnosis not present

## 2020-10-10 DIAGNOSIS — Z4801 Encounter for change or removal of surgical wound dressing: Secondary | ICD-10-CM | POA: Diagnosis not present

## 2020-10-10 DIAGNOSIS — E1140 Type 2 diabetes mellitus with diabetic neuropathy, unspecified: Secondary | ICD-10-CM | POA: Diagnosis not present

## 2020-10-10 DIAGNOSIS — Z7984 Long term (current) use of oral hypoglycemic drugs: Secondary | ICD-10-CM | POA: Diagnosis not present

## 2020-10-10 DIAGNOSIS — L97522 Non-pressure chronic ulcer of other part of left foot with fat layer exposed: Secondary | ICD-10-CM | POA: Diagnosis not present

## 2020-10-10 DIAGNOSIS — E11621 Type 2 diabetes mellitus with foot ulcer: Secondary | ICD-10-CM | POA: Diagnosis not present

## 2020-10-10 DIAGNOSIS — I10 Essential (primary) hypertension: Secondary | ICD-10-CM | POA: Diagnosis not present

## 2020-10-14 ENCOUNTER — Telehealth: Payer: Self-pay | Admitting: Internal Medicine

## 2020-10-14 ENCOUNTER — Other Ambulatory Visit: Payer: Self-pay

## 2020-10-14 ENCOUNTER — Telehealth (INDEPENDENT_AMBULATORY_CARE_PROVIDER_SITE_OTHER): Payer: Medicare Other | Admitting: Internal Medicine

## 2020-10-14 ENCOUNTER — Encounter: Payer: Self-pay | Admitting: Internal Medicine

## 2020-10-14 DIAGNOSIS — E11621 Type 2 diabetes mellitus with foot ulcer: Secondary | ICD-10-CM | POA: Diagnosis not present

## 2020-10-14 DIAGNOSIS — L97529 Non-pressure chronic ulcer of other part of left foot with unspecified severity: Secondary | ICD-10-CM

## 2020-10-14 DIAGNOSIS — Z8673 Personal history of transient ischemic attack (TIA), and cerebral infarction without residual deficits: Secondary | ICD-10-CM

## 2020-10-14 DIAGNOSIS — F419 Anxiety disorder, unspecified: Secondary | ICD-10-CM

## 2020-10-14 DIAGNOSIS — E7800 Pure hypercholesterolemia, unspecified: Secondary | ICD-10-CM

## 2020-10-14 DIAGNOSIS — I10 Essential (primary) hypertension: Secondary | ICD-10-CM | POA: Diagnosis not present

## 2020-10-14 DIAGNOSIS — D696 Thrombocytopenia, unspecified: Secondary | ICD-10-CM | POA: Diagnosis not present

## 2020-10-14 DIAGNOSIS — L97509 Non-pressure chronic ulcer of other part of unspecified foot with unspecified severity: Secondary | ICD-10-CM | POA: Diagnosis not present

## 2020-10-14 DIAGNOSIS — R634 Abnormal weight loss: Secondary | ICD-10-CM | POA: Diagnosis not present

## 2020-10-14 DIAGNOSIS — I70245 Atherosclerosis of native arteries of left leg with ulceration of other part of foot: Secondary | ICD-10-CM

## 2020-10-14 NOTE — Progress Notes (Signed)
Patient ID: [name], female   DOB: [birth year], 87 y.o.   MRN: [id number]   Virtual Visit via video Note  This visit type was conducted due to national recommendations for restrictions regarding the COVID-19 pandemic (e.g. social distancing).  This format is felt to be most appropriate for this patient at this time.  All issues noted in this document were discussed and addressed.  No physical exam was performed (except for noted visual exam findings with Video Visits).   I connected with Angela Patterson by a video enabled telemedicine application and verified that I am speaking with the correct person using two identifiers. Location patient: home Location provider: work or home office Persons participating in the virtual visit: patient, provider and pts daughter Angela Patterson.   The limitations, risks, security and privacy concerns of performing an evaluation and management service by video and the availability of in person appointments have been discussed.  It has also been discussed with the patient that there may be a patient responsible charge related to this service. The patient expressed understanding and agreed to proceed.   Reason for visit: scheduled follow up.   HPI: Follow up  - regarding her blood pressure, cholesterol and blood sugar.  Recently evaluated with cough and congestion.  Resolved.  No residual problems.  No cough or sob.  No chest pain or tightness reported. Eating well.  Weight up to 102.8 pounds.  Sugars varying, but overall ok.  Last a1c 7.2.  Tolerating her cholesterol medication.  Bowels better.  No diarrhea.  Receiving OT - [facility].  They are coming out weekly.  No falls.  Using her walker.     ROS: See pertinent positives and negatives per HPI.  Past Medical History:  Diagnosis Date  . Allergy   . Chicken pox   . CVA (cerebral vascular accident) ([place])   . Diabetes mellitus ([place])   . Hypercholesterolemia   . Hypertension     Past Surgical History:   Procedure Laterality Date  . ABDOMINAL HYSTERECTOMY  1971   excessive bleeding  . APPENDECTOMY  age 83  . BREAST LUMPECTOMY  1958   benign  . DILATION AND CURETTAGE OF UTERUS  1970    Family History  Problem Relation Age of Onset  . Liver cancer Father   . Stroke Mother   . Heart attack Brother   . Hypertension Daughter   . Hypertension Son   . Hypertension Daughter   . Cancer Grandchild        breast  . Diabetes Grandchild   . Breast cancer Neg Hx   . Colon cancer Neg Hx     SOCIAL HX: reviewed.    Current Outpatient Medications:  .  amLODipine (NORVASC) 5 MG tablet, TAKE 1 TABLET BY MOUTH  TWICE DAILY, Disp: 180 tablet, Rfl: 3 .  azithromycin (ZITHROMAX) 250 MG tablet, One tab every evening for 4 more doses, Disp: 4 each, Rfl: 0 .  cefdinir (OMNICEF) 300 MG capsule, Take 1 capsule (300 mg total) by mouth 2 (two) times daily for 4 days., Disp: 8 capsule, Rfl: 0 .  Cholecalciferol (VITAMIN D) 2000 units tablet, Take 2,000 Units by mouth daily. , Disp: , Rfl:  .  clopidogrel (PLAVIX) 75 MG tablet, TAKE 1 TABLET(75 MG) BY MOUTH DAILY, Disp: 90 tablet, Rfl: 1 .  glucose blood test strip, USE TO CHECK BLOOD SUGAR  TWO TIMES DAILY. Dx E11.9, Disp: 200 each, Rfl: 1 .  glucose blood test strip, OneTouch Ultra  Test strips, Disp: , Rfl:  .  Lancet Devices (ONE TOUCH DELICA LANCING DEV) MISC, Use twice daily Dx: 250.00, Disp: 100 each, Rfl: 5 .  OMEGA-3 FATTY ACIDS PO, Take 720 mg by mouth daily., Disp: , Rfl:  .  rosuvastatin (CRESTOR) 5 MG tablet, TAKE 1 TABLET(5 MG) BY MOUTH 2 TIMES A WEEK (Patient taking differently: TAKE 1 TABLET(5 MG) BY MOUTH 2 TIMES A WEEK. On Saturday and Wednesday), Disp: 26 tablet, Rfl: 1 .  sertraline (ZOLOFT) 25 MG tablet, TAKE 1 TO 2 TABLETS BY MOUTH DAILY, Disp: 180 tablet, Rfl: 1 .  timolol (BETIMOL) 0.5 % ophthalmic solution, 1 drop 2 (two) times daily., Disp: , Rfl:  .  timolol (TIMOPTIC) 0.5 % ophthalmic solution, , Disp: , Rfl:   EXAM:  VITALS  per patient if applicable: 480.1 pounds  GENERAL: alert, oriented, appears well and in no acute distress  HEENT: atraumatic, conjunttiva clear, no obvious abnormalities on inspection of external nose and ears  NECK: normal movements of the head and neck  LUNGS: on inspection no signs of respiratory distress, breathing rate appears normal, no obvious gross SOB, gasping or wheezing  CV: no obvious cyanosis  PSYCH/NEURO: pleasant and cooperative, no obvious depression or anxiety, speech and thought processing grossly intact  ASSESSMENT AND PLAN:  Discussed the following assessment and plan:  Problem List Items Addressed This Visit    Anxiety    Continue zoloft.  Doing well.  Follow.       Foot ulcer ([place])    Continues to follow up with podiatry.       History of CVA (cerebrovascular accident)    Continue plavix.       Hypercholesterolemia    On crestor.  Follow lipid panel and liver function tests.        Hypertension    Blood pressure has been under good control.  Follow pressures.  Follow metabolic panel.  Continue amlodipine.        Pure hypercholesterolemia    On crestor - taking 2 days per week.  Tolerating.  Follow lipid panel and liver function tests.  Hold on making changes.       Thrombocytopenia ([place])    Follow cbc.  Will have home health draw.       Type 2 diabetes mellitus ([facility])    Sugars doing ok.  a1c - 7.2.  Follow met b and a1c.       Weight loss    She is eating. Weight is up.  Follow.  Continue to encourage increased po intake.           I discussed the assessment and treatment plan with the patient. The patient was provided an opportunity to ask questions and all were answered. The patient agreed with the plan and demonstrated an understanding of the instructions.   The patient was advised to call back or seek an in-person evaluation if the symptoms worsen or if the condition fails to improve as anticipated.    Einar Pheasant, MD

## 2020-10-14 NOTE — Telephone Encounter (Signed)
[facility] - OT is coming out to see her.  Can we get them to have nurse come out and draw labs.  Would need lipid panel, a1c, met b, liver panel and cbc.

## 2020-10-15 NOTE — Telephone Encounter (Signed)
LM for [facility]

## 2020-10-17 ENCOUNTER — Telehealth: Payer: Self-pay | Admitting: Internal Medicine

## 2020-10-17 ENCOUNTER — Encounter: Payer: Self-pay | Admitting: Internal Medicine

## 2020-10-17 DIAGNOSIS — I10 Essential (primary) hypertension: Secondary | ICD-10-CM | POA: Diagnosis not present

## 2020-10-17 DIAGNOSIS — L97522 Non-pressure chronic ulcer of other part of left foot with fat layer exposed: Secondary | ICD-10-CM | POA: Diagnosis not present

## 2020-10-17 DIAGNOSIS — Z4801 Encounter for change or removal of surgical wound dressing: Secondary | ICD-10-CM | POA: Diagnosis not present

## 2020-10-17 DIAGNOSIS — E1140 Type 2 diabetes mellitus with diabetic neuropathy, unspecified: Secondary | ICD-10-CM | POA: Diagnosis not present

## 2020-10-17 DIAGNOSIS — Z7984 Long term (current) use of oral hypoglycemic drugs: Secondary | ICD-10-CM | POA: Diagnosis not present

## 2020-10-17 DIAGNOSIS — E11621 Type 2 diabetes mellitus with foot ulcer: Secondary | ICD-10-CM | POA: Diagnosis not present

## 2020-10-17 NOTE — Telephone Encounter (Signed)
Needs cbc, met b, a1c, lipid panel, liver panel and urine microalbumin/creatinine ratio.

## 2020-10-17 NOTE — Telephone Encounter (Signed)
rx printed.  See phone b note and my chart message.

## 2020-10-17 NOTE — Telephone Encounter (Signed)
Orders faxed to [facility].

## 2020-10-17 NOTE — Telephone Encounter (Signed)
See mychart.  

## 2020-10-18 ENCOUNTER — Emergency Department: Payer: Medicare Other

## 2020-10-18 ENCOUNTER — Other Ambulatory Visit: Payer: Self-pay

## 2020-10-18 ENCOUNTER — Observation Stay
Admission: EM | Admit: 2020-10-18 | Discharge: 2020-10-19 | Disposition: A | Discharge status: Home / Self Care | Payer: Medicare Other | Attending: Internal Medicine | Admitting: Internal Medicine

## 2020-10-18 DIAGNOSIS — R410 Disorientation, unspecified: Secondary | ICD-10-CM | POA: Diagnosis not present

## 2020-10-18 DIAGNOSIS — G319 Degenerative disease of nervous system, unspecified: Secondary | ICD-10-CM | POA: Diagnosis not present

## 2020-10-18 DIAGNOSIS — F0390 Unspecified dementia without behavioral disturbance: Secondary | ICD-10-CM | POA: Insufficient documentation

## 2020-10-18 DIAGNOSIS — E872 Acidosis, unspecified: Secondary | ICD-10-CM

## 2020-10-18 DIAGNOSIS — J181 Lobar pneumonia, unspecified organism: Secondary | ICD-10-CM | POA: Diagnosis not present

## 2020-10-18 DIAGNOSIS — I517 Cardiomegaly: Secondary | ICD-10-CM | POA: Diagnosis not present

## 2020-10-18 DIAGNOSIS — I672 Cerebral atherosclerosis: Secondary | ICD-10-CM | POA: Diagnosis not present

## 2020-10-18 DIAGNOSIS — Z20822 Contact with and (suspected) exposure to covid-19: Secondary | ICD-10-CM | POA: Diagnosis not present

## 2020-10-18 DIAGNOSIS — Z79899 Other long term (current) drug therapy: Secondary | ICD-10-CM | POA: Insufficient documentation

## 2020-10-18 DIAGNOSIS — R4182 Altered mental status, unspecified: Secondary | ICD-10-CM

## 2020-10-18 DIAGNOSIS — Z7984 Long term (current) use of oral hypoglycemic drugs: Secondary | ICD-10-CM | POA: Diagnosis not present

## 2020-10-18 DIAGNOSIS — G9341 Metabolic encephalopathy: Secondary | ICD-10-CM

## 2020-10-18 DIAGNOSIS — I10 Essential (primary) hypertension: Secondary | ICD-10-CM

## 2020-10-18 DIAGNOSIS — L089 Local infection of the skin and subcutaneous tissue, unspecified: Secondary | ICD-10-CM

## 2020-10-18 DIAGNOSIS — R0902 Hypoxemia: Secondary | ICD-10-CM | POA: Diagnosis not present

## 2020-10-18 DIAGNOSIS — G9340 Encephalopathy, unspecified: Secondary | ICD-10-CM | POA: Diagnosis not present

## 2020-10-18 DIAGNOSIS — J189 Pneumonia, unspecified organism: Secondary | ICD-10-CM | POA: Diagnosis not present

## 2020-10-18 DIAGNOSIS — J449 Chronic obstructive pulmonary disease, unspecified: Secondary | ICD-10-CM | POA: Diagnosis not present

## 2020-10-18 DIAGNOSIS — I739 Peripheral vascular disease, unspecified: Secondary | ICD-10-CM | POA: Diagnosis not present

## 2020-10-18 LAB — COMPREHENSIVE METABOLIC PANEL
ALT: 18 U/L (ref 0–44)
AST: 25 U/L (ref 15–41)
Albumin: 3.9 g/dL (ref 3.5–5.0)
Alkaline Phosphatase: 60 U/L (ref 38–126)
Anion gap: 13 (ref 5–15)
BUN: 18 mg/dL (ref 8–23)
CO2: 23 mmol/L (ref 22–32)
Calcium: 9.8 mg/dL (ref 8.9–10.3)
Chloride: 100 mmol/L (ref 98–111)
Creatinine, Ser: 0.66 mg/dL (ref 0.44–1.00)
GFR, Estimated: >60 mL/min (ref >60)
Glucose, Bld: 172 mg/dL — ABNORMAL HIGH (ref 70–99)
Potassium: 4.2 mmol/L (ref 3.5–5.1)
Sodium: 136 mmol/L (ref 135–145)
Total Bilirubin: 0.6 mg/dL (ref 0.3–1.2)
Total Protein: 7.5 g/dL (ref 6.5–8.1)

## 2020-10-18 LAB — URINALYSIS, COMPLETE (UACMP) WITH MICROSCOPIC
Bacteria, UA: NONE SEEN
Bilirubin Urine: NEGATIVE
Glucose, UA: NEGATIVE mg/dL
Hgb urine dipstick: NEGATIVE
Ketones, ur: NEGATIVE mg/dL
Nitrite: NEGATIVE
Protein, ur: NEGATIVE mg/dL
Specific Gravity, Urine: 1.021 (ref 1.005–1.030)
pH: 5 (ref 5.0–8.0)

## 2020-10-18 LAB — CBC
HCT: 41.1 % (ref 36.0–46.0)
Hemoglobin: 13.7 g/dL (ref 12.0–15.0)
MCH: 30.8 pg (ref 26.0–34.0)
MCHC: 33.3 g/dL (ref 30.0–36.0)
MCV: 92.4 fL (ref 80.0–100.0)
Platelets: 163 10*3/uL (ref 150–400)
RBC: 4.45 MIL/uL (ref 3.87–5.11)
RDW: 13.1 % (ref 11.5–15.5)
WBC: 10.1 10*3/uL (ref 4.0–10.5)
nRBC: 0 % (ref 0.0–0.2)

## 2020-10-18 LAB — RESP PANEL BY RT-PCR (FLU A&B, COVID) ARPGX2
Influenza A by PCR: NEGATIVE
Influenza B by PCR: NEGATIVE
SARS Coronavirus 2 by RT PCR: NEGATIVE

## 2020-10-18 LAB — LACTIC ACID, PLASMA: Lactic Acid, Venous: 3.4 mmol/L (ref 0.5–1.9)

## 2020-10-18 MED ORDER — HEPARIN SODIUM (PORCINE) 5000 UNIT/ML IJ SOLN
5000.0000 [IU] | Freq: Three times a day (TID) | INTRAMUSCULAR | Status: DC
  Administered 2020-10-19 (×2): 5000 [IU] via SUBCUTANEOUS
  Filled 2020-10-18 (×2): qty 1

## 2020-10-18 MED ORDER — CLOPIDOGREL BISULFATE 75 MG PO TABS
75.0000 mg | ORAL_TABLET | Freq: Every day | ORAL | Status: DC
  Administered 2020-10-19: 10:00:00 75 mg via ORAL
  Filled 2020-10-18: qty 1

## 2020-10-18 MED ORDER — LEVOFLOXACIN IN D5W 750 MG/150ML IV SOLN: 750.0000 mg | INTRAVENOUS | Status: DC

## 2020-10-18 MED ORDER — SODIUM CHLORIDE 0.9 % IV SOLN
500.0000 mg | INTRAVENOUS | Status: DC
  Administered 2020-10-18: 500 mg via INTRAVENOUS
  Filled 2020-10-18 (×3): qty 500

## 2020-10-18 MED ORDER — SODIUM CHLORIDE 0.9 % IV BOLUS
1000.0000 mL | Freq: Once | INTRAVENOUS | Status: AC
  Administered 2020-10-18: 1000 mL via INTRAVENOUS

## 2020-10-18 MED ORDER — SODIUM CHLORIDE 0.9 % IV SOLN
2.0000 g | INTRAVENOUS | Status: DC
  Administered 2020-10-18: 2 g via INTRAVENOUS
  Filled 2020-10-18 (×2): qty 20

## 2020-10-18 MED ORDER — ROSUVASTATIN CALCIUM 5 MG PO TABS
5.0000 mg | ORAL_TABLET | Freq: Every day | ORAL | Status: DC
  Administered 2020-10-19: 5 mg via ORAL
  Filled 2020-10-18: qty 1

## 2020-10-18 MED ORDER — AMLODIPINE BESYLATE 5 MG PO TABS
5.0000 mg | ORAL_TABLET | Freq: Two times a day (BID) | ORAL | Status: DC
  Administered 2020-10-19: 10:00:00 5 mg via ORAL
  Filled 2020-10-18: qty 1

## 2020-10-18 NOTE — ED Notes (Addendum)
Daughter bedside, reports pt 'was not talking the best at 1700 that resolved after about 66min.' pt HOH but able to answer all orientation questions and follow commands. Daughter reports pt uses roller walker at home.

## 2020-10-18 NOTE — ED Triage Notes (Signed)
Pt BIBA for altered mental status. Pt was talking with daughter and was confused. Daughter was concerned so called Ems. Pt is more alert now. Pt baseline Alert and oriented x4. Possible UTI. Pt denies dysuria but urine has odor.

## 2020-10-18 NOTE — ED Notes (Signed)
Pt ambulated to bathroom with assistance.

## 2020-10-18 NOTE — H&P (Addendum)
History and Physical    [name] TIR:[id number] DOB: 10-20-1920 DOA: 10/18/2020  PCP: Einar Pheasant, MD  Patient coming from: home  I have personally briefly reviewed patient's old medical records in [place]  Chief Complaint:  HPI: Angela Patterson is a 87 y.o. female with medical history significant of  DMII, CVA, HTN, HLD BIBA, chronic left foot wound, due to confusion noted by daughter. Patient has interim history of uri symptoms with coughing and sneezing that began on 3/19 she was evaluated by visiting RN after daughter called with concern. AT that time patient was afebrile and at baseline,symptoms have since resolved. Of note patient was not tested for COVID at that time.  Per daughter patient was more confused than baseline, but daughter notes no change in speech ,facial weakness, or focal motor weakness.  Due to acute change  Daughter called EMS. However, noted today was dark urine and strong odor. Daughter noted no fever, chills, notes no new cough, patient has a chronic cough.   ED Course: Vitlas: afeb, bp155/81, hr 80, rr 23, sat 96% on ra  Labs: wbc 10.1, hb 13.7, plt163 NA:136, K4.2, CL100, glu 172, bun18, cr 0.66 at baseline UA: negative Lactic:3.4 Respiratory panel:negative CT head: Chronic small vessel disease and generalized volume loss without acute abnormality. tx in ed ctx/azithromycin/ivfs Cxr: IMPRESSION: 1. Asymmetric right lower lobe airspace disease concerning for pneumonia. 2. Stable changes of COPD. 3. Cardiomegaly without failure 4. Aortic atherosclerosis.  Review of Systems: As per HPI otherwise 10 point review of systems negative.   Past Medical History:  Diagnosis Date  . Allergy   . Chicken pox   . CVA (cerebral vascular accident) ([place])   . Diabetes mellitus ([place])   . Hypercholesterolemia   . Hypertension     Past Surgical History:  Procedure Laterality Date  . ABDOMINAL HYSTERECTOMY  1971   excessive bleeding  .  APPENDECTOMY  age 66  . BREAST LUMPECTOMY  1958   benign  . DILATION AND CURETTAGE OF UTERUS  1970     reports that she has never smoked. She has never used smokeless tobacco. She reports that she does not drink alcohol and does not use drugs.  Allergies  Allergen Reactions  . Actos [Pioglitazone] Swelling  . Contrast Media [Iodinated Diagnostic Agents] Swelling  . Prandin [Repaglinide] Swelling  . Pravastatin Sodium     cramps    Family History  Problem Relation Age of Onset  . Liver cancer Father   . Stroke Mother   . Heart attack Brother   . Hypertension Daughter   . Hypertension Son   . Hypertension Daughter   . Cancer Grandchild        breast  . Diabetes Grandchild   . Breast cancer Neg Hx   . Colon cancer Neg Hx     Prior to Admission medications   Medication Sig Start Date End Date Taking? Authorizing Provider  amLODipine (NORVASC) 5 MG tablet TAKE 1 TABLET BY MOUTH  TWICE DAILY 07/01/20   Einar Pheasant, MD  cefUROXime (CEFTIN) 250 MG tablet Take 1 tablet (250 mg total) by mouth 2 (two) times daily with a meal. 06/29/20   Einar Pheasant, MD  Cholecalciferol (VITAMIN D) 2000 units tablet Take 2,000 Units by mouth daily.     [provider]  clopidogrel (PLAVIX) 75 MG tablet TAKE 1 TABLET(75 MG) BY MOUTH DAILY 10/01/20   Einar Pheasant, MD  glucose blood test strip USE TO CHECK BLOOD SUGAR  TWO TIMES DAILY. Dx E11.9 09/27/19   Einar Pheasant, MD  glucose blood test strip OneTouch Ultra Test strips    [provider]  hydrocortisone (PROCTOCORT) 1 % CREA Apply thin layer around rectal opening 2 times per day to help hemorrhoids 05/26/18   Jodelle Green, FNP  Lancet Devices (ONE TOUCH DELICA LANCING DEV) MISC Use twice daily Dx: 250.00 10/30/13   Einar Pheasant, MD  metFORMIN (GLUCOPHAGE-XR) 500 MG 24 hr tablet Take 2 tablets in the am and on in the evening 06/12/20   Einar Pheasant, MD  OMEGA-3 FATTY ACIDS PO Take 720 mg by mouth daily.     [provider]  rosuvastatin (CRESTOR) 5 MG tablet TAKE 1 TABLET(5 MG) BY MOUTH 2 TIMES A WEEK 10/01/20   Einar Pheasant, MD  sertraline (ZOLOFT) 25 MG tablet TAKE 1 TO 2 TABLETS BY MOUTH DAILY 10/01/20   Einar Pheasant, MD  timolol (BETIMOL) 0.5 % ophthalmic solution 1 drop 2 (two) times daily.    [provider]  timolol (TIMOPTIC) 0.5 % ophthalmic solution  12/09/19   [provider]    Physical Exam: Vitals:   10/18/20 2030 10/18/20 2100 10/18/20 2130 10/18/20 2200  BP: 134/74 (!) 138/58 133/65 (!) 148/82  Pulse: 65 67 68 (!) 55  Resp: 20 20 18  (!) 22  Temp:      TempSrc:      SpO2: 97% 93% 96% 95%  Weight:      Height:        Constitutional: NAD, calm, comfortable Vitals:   10/18/20 2030 10/18/20 2100 10/18/20 2130 10/18/20 2200  BP: 134/74 (!) 138/58 133/65 (!) 148/82  Pulse: 65 67 68 (!) 55  Resp: 20 20 18  (!) 22  Temp:      TempSrc:      SpO2: 97% 93% 96% 95%  Weight:      Height:      Constitutional: NAD, calm, comfortable Eyes: PERRL, lids and conjunctivae normal ENMT: Mucous membranes are moist. Posterior pharynx clear of any exudate or lesions.Normal dentition.  Neck: normal, supple, no masses, no thyromegaly Respiratory: clear to auscultation bilaterally, no wheezing, no crackles. Normal respiratory effort. No accessory muscle use.  Cardiovascular: Regular rate and rhythm, no murmurs / rubs / gallops. No extremity edema. 2+ pedal pulses. No carotid bruits.  Abdomen: no tenderness, no masses palpated. No hepatosplenomegaly. Bowel sounds positive.  Musculoskeletal: no clubbing / cyanosis. No joint deformity upper and lower extremities. Good ROM, no contractures. Normal muscle tone.  Skin: no rashes, lesions, ulcers. No induration Neurologic: CN 2-12 grossly intact. Sensation intact, DTR normal. Strength 5/5 in all 4.  Psychiatric: Normal judgment and insight. Alert and oriented x 3. Normal mood.    Labs on Admission: I have personally  reviewed following labs and imaging studies  CBC: Recent Labs  Lab 10/18/20 1931  WBC 10.1  HGB 13.7  HCT 41.1  MCV 92.4  PLT 528   Basic Metabolic Panel: Recent Labs  Lab 10/18/20 1931  NA 136  K 4.2  CL 100  CO2 23  GLUCOSE 172*  BUN 18  CREATININE 0.66  CALCIUM 9.8   GFR: Estimated Creatinine Clearance: 29.8 mL/min (by C-G formula based on SCr of 0.66 mg/dL). Liver Function Tests: Recent Labs  Lab 10/18/20 1931  AST 25  ALT 18  ALKPHOS 60  BILITOT 0.6  PROT 7.5  ALBUMIN 3.9   No results for input(s): LIPASE, AMYLASE in the last 168 hours. No results for input(s):  AMMONIA in the last 168 hours. Coagulation Profile: No results for input(s): INR, PROTIME in the last 168 hours. Cardiac Enzymes: No results for input(s): CKTOTAL, CKMB, CKMBINDEX, TROPONINI in the last 168 hours. BNP (last 3 results) No results for input(s): PROBNP in the last 8760 hours. HbA1C: No results for input(s): HGBA1C in the last 72 hours. CBG: No results for input(s): GLUCAP in the last 168 hours. Lipid Profile: No results for input(s): CHOL, HDL, LDLCALC, TRIG, CHOLHDL, LDLDIRECT in the last 72 hours. Thyroid Function Tests: No results for input(s): TSH, T4TOTAL, FREET4, T3FREE, THYROIDAB in the last 72 hours. Anemia Panel: No results for input(s): VITAMINB12, FOLATE, FERRITIN, TIBC, IRON, RETICCTPCT in the last 72 hours. Urine analysis:    Component Value Date/Time   COLORURINE YELLOW (A) 10/18/2020 2018   APPEARANCEUR HAZY (A) 10/18/2020 2018   LABSPEC 1.021 10/18/2020 2018   PHURINE 5.0 10/18/2020 2018   GLUCOSEU NEGATIVE 10/18/2020 2018   HGBUR NEGATIVE 10/18/2020 2018   BILIRUBINUR NEGATIVE 10/18/2020 2018   KETONESUR NEGATIVE 10/18/2020 2018   PROTEINUR NEGATIVE 10/18/2020 2018   NITRITE NEGATIVE 10/18/2020 2018   LEUKOCYTESUR TRACE (A) 10/18/2020 2018    Radiological Exams on Admission: CT Head Wo Contrast  Result Date: 10/18/2020 CLINICAL DATA:  Encephalopathy  EXAM: CT HEAD WITHOUT CONTRAST TECHNIQUE: Contiguous axial images were obtained from the base of the skull through the vertex without intravenous contrast. COMPARISON:  None. FINDINGS: Brain: There is no mass, hemorrhage or extra-axial collection. There is generalized atrophy without lobar predilection. Hypodensity of the white matter is most commonly associated with chronic microvascular disease. Old small vessel infarcts of the basal ganglia on the right. Vascular: Atherosclerotic calcification of the vertebral and internal carotid arteries at the skull base. No abnormal hyperdensity of the major intracranial arteries or dural venous sinuses. Skull: The visualized skull base, calvarium and extracranial soft tissues are normal. Sinuses/Orbits: No fluid levels or advanced mucosal thickening of the visualized paranasal sinuses. No mastoid or middle ear effusion. The orbits are normal. IMPRESSION: Chronic small vessel disease and generalized volume loss without acute abnormality. Electronically Signed   By: Ulyses Jarred M.D.   On: 10/18/2020 22:03   DG Chest Portable 1 View  Result Date: 10/18/2020 CLINICAL DATA:  Altered mental status.  Evaluate for infiltrate. EXAM: PORTABLE CHEST 1 VIEW COMPARISON:  One-view chest x-ray 04/16/2017 FINDINGS: Heart is enlarged. Atherosclerotic changes are noted at the aortic arch. No edema or effusion is present. Asymmetric scratched at changes of COPD are again noted. Asymmetric right lower lobe airspace disease is present. IMPRESSION: 1. Asymmetric right lower lobe airspace disease concerning for pneumonia. 2. Stable changes of COPD. 3. Cardiomegaly without failure 4. Aortic atherosclerosis. Electronically Signed   By: San Morelle M.D.   On: 10/18/2020 20:28    EKG: Independently reviewed. Poor tracing, sinus rbbb  Assessment/Plan  CAP - Opacities on cxr with hx of prior uri and increase lactic with change in ms -ctx/ azithromycin, de-escalate as able   -pulmonary toilet  -urine ag, sputum, f/u on culture data  -patient of note is not hypoxic  -supportive O2 as needed   Lactic acidosis -due to infection ,source presumed pulmonary  -will tend lactic   DMII -iss, fs -on metformin -hold due to lactic acidosis  -resume as able  -fs stable    CVA -continue plavix    HTN -slightly elevated on admission  -resume amlodipine     HLD -continue crestor   Glaucoma -resume home regimen  DVT prophylaxis:  Heparin Code Status:Full code Family Communication: discussed with dtr at bedside Disposition Plan: patient  expected to be admitted less than 2 midnights Consults called: n/a Admission status: inpatient   Clance Boll MD [facility]  If 7PM-7AM, please contact night-coverage [email or web address] Password [facility]  10/18/2020, 11:20 PM

## 2020-10-18 NOTE — ED Provider Notes (Signed)
[facility] Emergency Department Provider Note    Event Date/Time   First MD Initiated Contact with Patient 10/18/20 1915     (approximate)  I have reviewed the triage vital signs and the nursing notes.   HISTORY  Chief Complaint Altered Mental Status    HPI Angela Patterson is a 87 y.o. female below listed past medical history presents to the ER for evaluation of confusion.  Patient does have a history known dementia but was probably talking to her son on exam today on his much more confused than baseline.  Was seen by EMS and the first responders told the daughter that she "sounds like she has a urinary tract infection.  ".  Does have a history of the same with confusion.  Denies any pain.  No headache.  Somewhat confused but pleasant and cooperative.    Past Medical History:  Diagnosis Date  . Allergy   . Chicken pox   . CVA (cerebral vascular accident) ([place])   . Diabetes mellitus ([place])   . Hypercholesterolemia   . Hypertension    Family History  Problem Relation Age of Onset  . Liver cancer Father   . Stroke Mother   . Heart attack Brother   . Hypertension Daughter   . Hypertension Son   . Hypertension Daughter   . Cancer Grandchild        breast  . Diabetes Grandchild   . Breast cancer Neg Hx   . Colon cancer Neg Hx    Past Surgical History:  Procedure Laterality Date  . ABDOMINAL HYSTERECTOMY  1971   excessive bleeding  . APPENDECTOMY  age 26  . BREAST LUMPECTOMY  1958   benign  . DILATION AND CURETTAGE OF UTERUS  1970   Patient Active Problem List   Diagnosis Date Noted  . CAP (community acquired pneumonia) 10/18/2020  . Cough 09/15/2020  . Finger lesion 06/13/2020  . Nasal lesion 06/13/2020  . Change in hearing 12/18/2019  . Finger laceration 02/21/2019  . Gait abnormality 11/13/2018  . Falls 08/28/2018  . Anxiety 06/22/2018  . Diarrhea 12/13/2017  . Rectal bleeding 07/07/2017  . Foot ulcer ([place]) 04/18/2017  . Open  wound of foot excluding toes without complication 62/09/5595  . Health care maintenance 10/27/2014  . Weight loss 12/27/2013  . Thrombocytopenia ([place]) 11/20/2012  . Hypertension 07/21/2012  . Hypercholesterolemia 07/21/2012  . History of CVA (cerebrovascular accident) 07/21/2012  . Diabetes mellitus ([place]) 07/21/2012  . Essential (primary) hypertension 07/21/2012  . Pure hypercholesterolemia 07/21/2012  . Type 2 diabetes mellitus ([place]) 07/21/2012      Prior to Admission medications   Medication Sig Start Date End Date Taking? Authorizing Provider  amLODipine (NORVASC) 5 MG tablet TAKE 1 TABLET BY MOUTH  TWICE DAILY 07/01/20   Einar Pheasant, MD  cefUROXime (CEFTIN) 250 MG tablet Take 1 tablet (250 mg total) by mouth 2 (two) times daily with a meal. 06/29/20   Einar Pheasant, MD  Cholecalciferol (VITAMIN D) 2000 units tablet Take 2,000 Units by mouth daily.     [provider]  clopidogrel (PLAVIX) 75 MG tablet TAKE 1 TABLET(75 MG) BY MOUTH DAILY 10/01/20   Einar Pheasant, MD  glucose blood test strip USE TO CHECK BLOOD SUGAR  TWO TIMES DAILY. Dx E11.9 09/27/19   Einar Pheasant, MD  glucose blood test strip OneTouch Ultra Test strips    [provider]  hydrocortisone (PROCTOCORT) 1 % CREA Apply thin layer around rectal opening 2  times per day to help hemorrhoids 05/26/18   Jodelle Green, FNP  Lancet Devices (ONE TOUCH DELICA LANCING DEV) MISC Use twice daily Dx: 250.00 10/30/13   Einar Pheasant, MD  metFORMIN (GLUCOPHAGE-XR) 500 MG 24 hr tablet Take 2 tablets in the am and on in the evening 06/12/20   Einar Pheasant, MD  OMEGA-3 FATTY ACIDS PO Take 720 mg by mouth daily.    [provider]  rosuvastatin (CRESTOR) 5 MG tablet TAKE 1 TABLET(5 MG) BY MOUTH 2 TIMES A WEEK 10/01/20   Einar Pheasant, MD  sertraline (ZOLOFT) 25 MG tablet TAKE 1 TO 2 TABLETS BY MOUTH DAILY 10/01/20   Einar Pheasant, MD  timolol (BETIMOL) 0.5 % ophthalmic solution 1 drop 2 (two) times  daily.    [provider]  timolol (TIMOPTIC) 0.5 % ophthalmic solution  12/09/19   [provider]    Allergies Actos [pioglitazone], Contrast media [iodinated diagnostic agents], Prandin [repaglinide], and Pravastatin sodium    Social History Social History   Tobacco Use  . Smoking status: Never Smoker  . Smokeless tobacco: Never Used  Substance Use Topics  . Alcohol use: No    Alcohol/week: 0.0 standard drinks  . Drug use: No    Review of Systems Patient denies headaches, rhinorrhea, blurry vision, numbness, shortness of breath, chest pain, edema, cough, abdominal pain, nausea, vomiting, diarrhea, dysuria, fevers, rashes or hallucinations unless otherwise stated above in HPI. ____________________________________________   PHYSICAL EXAM:  VITAL SIGNS: Vitals:   10/18/20 2130 10/18/20 2200  BP: 133/65 (!) 148/82  Pulse: 68 (!) 55  Resp: 18 (!) 22  Temp:    SpO2: 96% 95%    Constitutional: Alert, pleasant and cooperative  Eyes: Conjunctivae are normal.  Head: Atraumatic. Nose: No congestion/rhinnorhea. Mouth/Throat: Mucous membranes are moist.   Neck: No stridor. Painless ROM.  Cardiovascular: Normal rate, regular rhythm. Grossly normal heart sounds.  Good peripheral circulation. Respiratory: Normal respiratory effort.  No retractions. Lungs CTAB. Gastrointestinal: Soft and nontender. No distention. No abdominal bruits. No CVA tenderness. Genitourinary:  Musculoskeletal: No lower extremity tenderness nor edema.  No joint effusions. Neurologic:  Normal speech and language. No gross focal neurologic deficits are appreciated. No facial droop Skin:  Skin is warm, dry and intact. No rash noted. Psychiatric: Mood and affect are normal. Speech and behavior are normal.  ____________________________________________   LABS (all labs ordered are listed, but only abnormal results are displayed)  Results for orders placed or performed during the hospital  encounter of 10/18/20 (from the past 24 hour(s))  CBC     Status: None   Collection Time: 10/18/20  7:31 PM  Result Value Ref Range   WBC 10.1 4.0 - 10.5 K/uL   RBC 4.45 3.87 - 5.11 MIL/uL   Hemoglobin 13.7 12.0 - 15.0 g/dL   HCT 41.1 36.0 - 46.0 %   MCV 92.4 80.0 - 100.0 fL   MCH 30.8 26.0 - 34.0 pg   MCHC 33.3 30.0 - 36.0 g/dL   RDW 13.1 11.5 - 15.5 %   Platelets 163 150 - 400 K/uL   nRBC 0.0 0.0 - 0.2 %  Comprehensive metabolic panel     Status: Abnormal   Collection Time: 10/18/20  7:31 PM  Result Value Ref Range   Sodium 136 135 - 145 mmol/L   Potassium 4.2 3.5 - 5.1 mmol/L   Chloride 100 98 - 111 mmol/L   CO2 23 22 - 32 mmol/L   Glucose, Bld 172 (H) 70 -  99 mg/dL   BUN 18 8 - 23 mg/dL   Creatinine, Ser 0.66 0.44 - 1.00 mg/dL   Calcium 9.8 8.9 - 10.3 mg/dL   Total Protein 7.5 6.5 - 8.1 g/dL   Albumin 3.9 3.5 - 5.0 g/dL   AST 25 15 - 41 U/L   ALT 18 0 - 44 U/L   Alkaline Phosphatase 60 38 - 126 U/L   Total Bilirubin 0.6 0.3 - 1.2 mg/dL   GFR, Estimated >60 >60 mL/min   Anion gap 13 5 - 15  Urinalysis, Complete w Microscopic Urine, Clean Catch     Status: Abnormal   Collection Time: 10/18/20  8:18 PM  Result Value Ref Range   Color, Urine YELLOW (A) YELLOW   APPearance HAZY (A) CLEAR   Specific Gravity, Urine 1.021 1.005 - 1.030   pH 5.0 5.0 - 8.0   Glucose, UA NEGATIVE NEGATIVE mg/dL   Hgb urine dipstick NEGATIVE NEGATIVE   Bilirubin Urine NEGATIVE NEGATIVE   Ketones, ur NEGATIVE NEGATIVE mg/dL   Protein, ur NEGATIVE NEGATIVE mg/dL   Nitrite NEGATIVE NEGATIVE   Leukocytes,Ua TRACE (A) NEGATIVE   WBC, UA 0-5 0 - 5 WBC/hpf   Bacteria, UA NONE SEEN NONE SEEN   Squamous Epithelial / LPF 0-5 0 - 5   Mucus PRESENT    Ca Oxalate Crys, UA PRESENT   Lactic acid, plasma     Status: Abnormal   Collection Time: 10/18/20  9:14 PM  Result Value Ref Range   Lactic Acid, Venous 3.4 (HH) 0.5 - 1.9 mmol/L  Resp Panel by RT-PCR (Flu A&B, Covid) Nasopharyngeal Swab     Status:  None   Collection Time: 10/18/20  9:18 PM   Specimen: Nasopharyngeal Swab; Nasopharyngeal(NP) swabs in vial transport medium  Result Value Ref Range   SARS Coronavirus 2 by RT PCR NEGATIVE NEGATIVE   Influenza A by PCR NEGATIVE NEGATIVE   Influenza B by PCR NEGATIVE NEGATIVE   ____________________________________________  EKG My review and personal interpretation at Time: 19:15   Indication: ams  Rate: 75  Rhythm: first degree av block Axis: normal Other: nonspecific st ab, no stemi criteria ____________________________________________  RADIOLOGY  I personally reviewed all radiographic images ordered to evaluate for the above acute complaints and reviewed radiology reports and findings.  These findings were personally discussed with the patient.  Please see medical record for radiology report.  ____________________________________________   PROCEDURES  Procedure(s) performed:  Procedures    Critical Care performed: no ____________________________________________   INITIAL IMPRESSION / ASSESSMENT AND PLAN / ED COURSE  Pertinent labs & imaging results that were available during my care of the patient were reviewed by me and considered in my medical decision making (see chart for details).   DDX: Dehydration, sepsis, pna, uti, hypoglycemia, cva, drug effect, withdrawal, encephalitis   JESSIKA ROTHERY is a 87 y.o. who presents to the ED with presentation as described above.  Patient nontoxic-appearing not hypoxic but does appear more confused coronary baseline and family.  Blood work sent for the above differential.  The patient will be placed on continuous pulse oximetry and telemetry for monitoring.  Laboratory evaluation will be sent to evaluate for the above complaints.     Clinical Course as of 10/18/20 2355  Fri Oct 18, 2020  2151 His lactate is elevated.  Have ordered broad-spectrum antibiotics and have ordered IV fluid bolus. [PR]    Clinical Course User Index [PR]  Merlyn Lot, MD   No sign of  UTI.  Chest x-ray does show sign of probable developed pneumonia.  CT head normal.  Given her elevated lactate and confusion do feel that observation the hospital for fluids dose of IV antibiotics would be appropriate.  Discussed case with hospitalist who kindly agrees to the patient.   The patient was evaluated in Emergency Department today for the symptoms described in the history of present illness. He/she was evaluated in the context of the global COVID-19 pandemic, which necessitated consideration that the patient might be at risk for infection with the SARS-CoV-2 virus that causes COVID-19. Institutional protocols and algorithms that pertain to the evaluation of patients at risk for COVID-19 are in a state of rapid change based on information released by regulatory bodies including the CDC and federal and state organizations. These policies and algorithms were followed during the patient's care in the ED.  As part of my medical decision making, I reviewed the following data within the [place] notes reviewed and incorporated, Labs reviewed, notes from prior ED visits and [place] Controlled Substance Database   ____________________________________________   FINAL CLINICAL IMPRESSION(S) / ED DIAGNOSES  Final diagnoses:  Altered mental status, unspecified altered mental status type  Community acquired pneumonia of right lower lobe of lung      NEW MEDICATIONS STARTED DURING THIS VISIT:  New Prescriptions   No medications on file     Note:  This document was prepared using Dragon voice recognition software and may include unintentional dictation errors.    Merlyn Lot, MD 10/18/20 [phone]

## 2020-10-19 ENCOUNTER — Other Ambulatory Visit: Payer: Self-pay

## 2020-10-19 ENCOUNTER — Encounter: Payer: Self-pay | Admitting: Internal Medicine

## 2020-10-19 ENCOUNTER — Observation Stay: Payer: Medicare Other

## 2020-10-19 DIAGNOSIS — E872 Acidosis, unspecified: Secondary | ICD-10-CM

## 2020-10-19 DIAGNOSIS — G9341 Metabolic encephalopathy: Secondary | ICD-10-CM | POA: Diagnosis not present

## 2020-10-19 DIAGNOSIS — Z8673 Personal history of transient ischemic attack (TIA), and cerebral infarction without residual deficits: Secondary | ICD-10-CM

## 2020-10-19 DIAGNOSIS — I10 Essential (primary) hypertension: Secondary | ICD-10-CM

## 2020-10-19 DIAGNOSIS — J181 Lobar pneumonia, unspecified organism: Secondary | ICD-10-CM | POA: Diagnosis not present

## 2020-10-19 DIAGNOSIS — S91102A Unspecified open wound of left great toe without damage to nail, initial encounter: Secondary | ICD-10-CM | POA: Diagnosis not present

## 2020-10-19 LAB — C-REACTIVE PROTEIN: CRP: 0.5 mg/dL (ref <1.0)

## 2020-10-19 LAB — COMPREHENSIVE METABOLIC PANEL
ALT: 17 U/L (ref 0–44)
AST: 23 U/L (ref 15–41)
Albumin: 3.5 g/dL (ref 3.5–5.0)
Alkaline Phosphatase: 54 U/L (ref 38–126)
Anion gap: 12 (ref 5–15)
BUN: 14 mg/dL (ref 8–23)
CO2: 21 mmol/L — ABNORMAL LOW (ref 22–32)
Calcium: 8.8 mg/dL — ABNORMAL LOW (ref 8.9–10.3)
Chloride: 104 mmol/L (ref 98–111)
Creatinine, Ser: 0.5 mg/dL (ref 0.44–1.00)
GFR, Estimated: >60 mL/min (ref >60)
Glucose, Bld: 158 mg/dL — ABNORMAL HIGH (ref 70–99)
Potassium: 3.9 mmol/L (ref 3.5–5.1)
Sodium: 137 mmol/L (ref 135–145)
Total Bilirubin: 0.5 mg/dL (ref 0.3–1.2)
Total Protein: 6.9 g/dL (ref 6.5–8.1)

## 2020-10-19 LAB — SEDIMENTATION RATE: Sed Rate: 25 mm/hr (ref 0–30)

## 2020-10-19 LAB — CBC WITH DIFFERENTIAL/PLATELET
Abs Immature Granulocytes: 0.04 10*3/uL (ref 0.00–0.07)
Basophils Absolute: 0.1 10*3/uL (ref 0.0–0.1)
Basophils Relative: 0 %
Eosinophils Absolute: 0.2 10*3/uL (ref 0.0–0.5)
Eosinophils Relative: 2 %
HCT: 40.3 % (ref 36.0–46.0)
Hemoglobin: 13.1 g/dL (ref 12.0–15.0)
Immature Granulocytes: 0 %
Lymphocytes Relative: 20 %
Lymphs Abs: 2.2 10*3/uL (ref 0.7–4.0)
MCH: 30.7 pg (ref 26.0–34.0)
MCHC: 32.5 g/dL (ref 30.0–36.0)
MCV: 94.4 fL (ref 80.0–100.0)
Monocytes Absolute: 1.1 10*3/uL — ABNORMAL HIGH (ref 0.1–1.0)
Monocytes Relative: 10 %
Neutro Abs: 7.6 10*3/uL (ref 1.7–7.7)
Neutrophils Relative %: 68 %
Platelets: 137 10*3/uL — ABNORMAL LOW (ref 150–400)
RBC: 4.27 MIL/uL (ref 3.87–5.11)
RDW: 13 % (ref 11.5–15.5)
WBC: 11.2 10*3/uL — ABNORMAL HIGH (ref 4.0–10.5)
nRBC: 0 % (ref 0.0–0.2)

## 2020-10-19 LAB — LACTIC ACID, PLASMA
Lactic Acid, Venous: 2.9 mmol/L (ref 0.5–1.9)
Lactic Acid, Venous: 2 mmol/L (ref 0.5–1.9)

## 2020-10-19 LAB — CREATININE, SERUM
Creatinine, Ser: 0.49 mg/dL (ref 0.44–1.00)
GFR, Estimated: >60 mL/min (ref >60)

## 2020-10-19 LAB — STREP PNEUMONIAE URINARY ANTIGEN: Strep Pneumo Urinary Antigen: NEGATIVE

## 2020-10-19 LAB — HIV ANTIBODY (ROUTINE TESTING W REFLEX): HIV Screen 4th Generation wRfx: NONREACTIVE

## 2020-10-19 LAB — GLUCOSE, CAPILLARY: Glucose-Capillary: 158 mg/dL — ABNORMAL HIGH (ref 70–99)

## 2020-10-19 MED ORDER — CEFDINIR 300 MG PO CAPS: 300.0000 mg | ORAL_CAPSULE | Freq: Two times a day (BID) | ORAL | 0 refills | Status: DC

## 2020-10-19 MED ORDER — AZITHROMYCIN 250 MG PO TABS: ORAL_TABLET | ORAL | 0 refills | Status: DC

## 2020-10-19 NOTE — ED Notes (Addendum)
Pt attempting to get out of bed, assisted to bathroom

## 2020-10-19 NOTE — Progress Notes (Signed)
Patient discharged home with daughter and granddaughter at 40 in stable condition. Patient AAOx3-4. Patient ajnd daughter were given discharge education and verbalized understanding of all educational material received. Patient left with all personal belongings and IV/tele were removed prior to discharge.

## 2020-10-19 NOTE — Discharge Instructions (Signed)
Stop metformin (glucophage) (she had lactic acidosis upon coming into the hospital so glucophage is contraindicated.  For diabetes can try diet control for now and follow up with your medical doctor.  Community-Acquired Pneumonia, Adult Pneumonia is an infection of the lungs. It causes irritation and swelling in the airways of the lungs. Mucus and fluid may also build up inside the airways. This may cause coughing and trouble breathing. One type of pneumonia can happen while you are in a hospital. A different type can happen when you are not in a hospital (community-acquired pneumonia). What are the causes? This condition is caused by germs (viruses, bacteria, or fungi). Some types of germs can spread from person to person. Pneumonia is not thought to spread from person to person.   What increases the risk? You are more likely to develop this condition if:  You have a long-term (chronic) disease, such as: ? Disease of the lungs. This may be chronic obstructive pulmonary disease (COPD) or asthma. ? Heart failure. ? Cystic fibrosis. ? Diabetes. ? Kidney disease. ? Sickle cell disease. ? HIV.  You have other health problems, such as: ? Your body's defense system (immune system) is weak. ? A condition that may cause you to breathe in fluids from your mouth and nose.  You had your spleen taken out.  You do not take good care of your teeth and mouth (poor dental hygiene).  You use or have used tobacco products.  You travel where the germs that cause this illness are common.  You are near certain animals or the places they live.  You are older than 85 years of age. What are the signs or symptoms? Symptoms of this condition include:  A cough.  A fever.  Sweating or chills.  Chest pain, often when you breathe deeply or cough.  Breathing problems, such as: ? Fast breathing. ? Trouble breathing. ? Shortness of breath.  Feeling tired (fatigued).  Muscle aches. How is this  treated? Treatment for this condition depends on many things, such as:  The cause of your illness.  Your medicines.  Your other health problems. Most adults can be treated at home. Sometimes, treatment must happen in a hospital.  Treatment may include medicines to kill germs.  Medicines may depend on which germ caused your illness. Very bad pneumonia is rare. If you get it, you may:  Have a machine to help you breathe.  Have fluid taken away from around your lungs. Follow these instructions at home: Medicines  Take over-the-counter and prescription medicines only as told by your doctor.  Take cough medicine only if you are losing sleep. Cough medicine can keep your body from taking mucus away from your lungs.  If you were prescribed an antibiotic medicine, take it as told by your doctor. Do not stop taking the antibiotic even if you start to feel better. Lifestyle  Do not drink alcohol.  Do not use any products that contain nicotine or tobacco, such as cigarettes, e-cigarettes, and chewing tobacco. If you need help quitting, ask your doctor.  Eat a healthy diet. This includes a lot of vegetables, fruits, whole grains, low-fat dairy products, and low-fat (lean) protein.      General instructions  Rest a lot. Sleep for at least 8 hours each night.  Sleep with your head and neck raised. Put a few pillows under your head or sleep in a reclining chair.  Return to your normal activities as told by your doctor. Ask your doctor  what activities are safe for you.  Drink enough fluid to keep your pee (urine) pale yellow.  If your throat is sore, rinse your mouth often with salt water. To make salt water, dissolve -1 tsp (3-6 g) of salt in 1 cup (237 mL) of warm water.  Keep all follow-up visits as told by your doctor. This is important.   How is this prevented? You can lower your risk of pneumonia by:  Getting the pneumonia shot (vaccine). These shots have different types and  schedules. Ask your doctor what works best for you. Think about getting this shot if: ? You are older than 85 years of age. ? You are 54-80 years of age and:  You are being treated for cancer.  You have long-term lung disease.  You have other problems that affect your body's defense system. Ask your doctor if you have one of these.  Getting your flu shot every year. Ask your doctor which type of shot is best for you.  Going to the dentist as often as told.  Washing your hands often with soap and water for at least 20 seconds. If you cannot use soap and water, use hand sanitizer. Contact a doctor if:  You have a fever.  You lose sleep because your cough medicine does not help. Get help right away if:  You are short of breath and this gets worse.  You have more chest pain.  Your sickness gets worse. This is very serious if: ? You are an older adult. ? Your body's defense system is weak.  You cough up blood. These symptoms may be an emergency. Do not wait to see if the symptoms will go away. Get medical help right away. Call your local emergency services (911 in the [place] Do not drive yourself to the hospital. Summary  Pneumonia is an infection of the lungs.  Community-acquired pneumonia affects people who have not been in the hospital. Certain germs can cause this infection.  This condition may be treated with medicines that kill germs.  For very bad pneumonia, you may need a hospital stay and treatment to help with breathing. This information is not intended to replace advice given to you by your health care provider. Make sure you discuss any questions you have with your health care provider. Document Revised: 04/18/2019 Document Reviewed: 04/18/2019 Elsevier Patient Education  [place].

## 2020-10-19 NOTE — TOC Transition Note (Signed)
Transition of Care [facility]) - CM/SW Discharge Note   Patient Details  Name: [name] MRN: [id number] Date of Birth: 05/27/1921  Transition of [facility]) CM/SW Contact:  Harriet Masson, RN Phone [phone] 10/19/2020, 10:39 AM   Clinical Narrative:    Pt ready for discharge today and resume care with [facility]. RN attempted outreach twice and left message to representative with [facility] Glennis Brink [phone]. Also spoke with sister [name] who confirm her daughter Darrick Penna will be the transport person today. Daughter [name] in the room as RN spoke with this daughter to follow up with [facility] to resume services and PT as been added along with ongoing OT services in the home. No other inquires or related questions at this time.   TOP will continue to follow for discharge needs.   Final next level of care: [place] Barriers to Discharge: No Barriers Identified   Patient Goals and CMS Choice        Discharge Placement                  Name of family member notified: Sister [name] and daughter [name] Patient and family notified of of transfer: 10/19/20  Discharge Plan and Services                              Date [place]: 10/19/20 Time [place]: 1015 Representative spoke with at [place]: Glennis Brink (Message was left)  Social Determinants of Health (SDOH) Interventions     Readmission Risk Interventions No flowsheet data found.

## 2020-10-19 NOTE — Plan of Care (Signed)

## 2020-10-19 NOTE — Progress Notes (Signed)
Pharmacy Antibiotic Note  Angela Patterson is a 87 y.o. female admitted on 10/18/2020 with pneumonia.  Pharmacy has been consulted for levaquin dosing.  Plan: No allergy to beta-lactams listed , pt received ceftriaxone 2 gm IV X 1 in ED on 4/1 @ 2138 without apparent issue.   Will d/c levaquin and continue with ceftriaxone and azithromycin.   Height: 5\' 2"  (157.5 cm) Weight: 47 kg (103 lb 9.9 oz) IBW/kg (Calculated) : 50.1  Temp (24hrs), Avg:97.8 F (36.6 C), Min:97.8 F (36.6 C), Max:97.8 F (36.6 C)  Recent Labs  Lab 10/18/20 1931 10/18/20 2114  WBC 10.1  --   CREATININE 0.66  --   LATICACIDVEN  --  3.4*    Estimated Creatinine Clearance: 29.8 mL/min (by C-G formula based on SCr of 0.66 mg/dL).    Allergies  Allergen Reactions  . Actos [Pioglitazone] Swelling  . Contrast Media [Iodinated Diagnostic Agents] Swelling  . Prandin [Repaglinide] Swelling  . Pravastatin Sodium     cramps    Antimicrobials this admission:   >>    >>   Dose adjustments this admission:   Microbiology results:  BCx:   UCx:    Sputum:    MRSA PCR:   Thank you for allowing pharmacy to be a part of this patient's care.  Khamauri Bauernfeind D 10/19/2020 12:09 AM

## 2020-10-19 NOTE — Discharge Summary (Signed)
[facility] NAME: Angela Patterson    MR#:  [id number]  DATE OF BIRTH:  Feb 12, 1921  DATE OF ADMISSION:  10/18/2020 ADMITTING PHYSICIAN: Clance Boll, MD  DATE OF DISCHARGE: 10/19/2020  1:13 PM  PRIMARY CARE PHYSICIAN: Einar Pheasant, MD    ADMISSION DIAGNOSIS:  Acute metabolic encephalopathy Lobar pneumonia  DISCHARGE DIAGNOSIS:  Active Problems:   CAP (community acquired pneumonia)   SECONDARY DIAGNOSIS:   Past Medical History:  Diagnosis Date  . Allergy   . Chicken pox   . CVA (cerebral vascular accident) ([place])   . Diabetes mellitus ([place])   . Hypercholesterolemia   . Hypertension     HOSPITAL COURSE:   1.  Right lower lobar pneumonia.  Patient started on Rocephin and Zithromax.  Patient feeling well today and wanting to go home we will switch over to Zithromax orally and Omnicef. 2.  Lactic acidosis secondary to Metformin.  Discontinue Metformin.  Could also be secondary to pneumonia.  IV fluids were given and lactic acid trended better.  Continue to hold metformin upon disposition. 3.  Acute metabolic encephalopathy.  Patient was confused and slurring words prior to coming in.  This has improved and patient's mental status back to normal.  CT scan head was negative. 4.  Type 2 diabetes mellitus with hyperlipidemia.  Continue Crestor.  Discontinue Metformin secondary to lactic acidosis.  Hemoglobin A1c 7.2 can try diet control for right now.  Follow-up with PMD and can consider starting another agent for diabetes as outpatient. 5.  Prior history of nonhemorrhagic stroke.  Continue Plavix 6.  Essential hypertension on amlodipine 7.  Glaucoma unspecified continue eyedrops 8.  Resume home health   DISCHARGE CONDITIONS:   Satisfactory  CONSULTS OBTAINED:  None  DRUG ALLERGIES:   Allergies  Allergen Reactions  . Actos [Pioglitazone] Swelling  . Contrast Media [Iodinated Diagnostic Agents] Swelling  . Prandin  [Repaglinide] Swelling  . Pravastatin Sodium     cramps    DISCHARGE MEDICATIONS:   Allergies as of 10/19/2020      Reactions   Actos [pioglitazone] Swelling   Contrast Media [iodinated Diagnostic Agents] Swelling   Prandin [repaglinide] Swelling   Pravastatin Sodium    cramps      Medication List    STOP taking these medications   cefUROXime 250 MG tablet Commonly known as: CEFTIN   metFORMIN 500 MG 24 hr tablet Commonly known as: GLUCOPHAGE-XR     TAKE these medications   amLODipine 5 MG tablet Commonly known as: NORVASC TAKE 1 TABLET BY MOUTH  TWICE DAILY   azithromycin 250 MG tablet Commonly known as: Zithromax One tab every evening for 4 more doses   cefdinir 300 MG capsule Commonly known as: OMNICEF Take 1 capsule (300 mg total) by mouth 2 (two) times daily for 4 days.   clopidogrel 75 MG tablet Commonly known as: PLAVIX TAKE 1 TABLET(75 MG) BY MOUTH DAILY   glucose blood test strip OneTouch Ultra Test strips   glucose blood test strip USE TO CHECK BLOOD SUGAR  TWO TIMES DAILY. Dx E11.9   OMEGA-3 FATTY ACIDS PO Take 720 mg by mouth daily.   ONE TOUCH DELICA LANCING DEV Misc Use twice daily Dx: 250.00   rosuvastatin 5 MG tablet Commonly known as: CRESTOR TAKE 1 TABLET(5 MG) BY MOUTH 2 TIMES A WEEK What changed: additional instructions   sertraline 25 MG tablet Commonly known as: ZOLOFT TAKE 1 TO 2 TABLETS BY  MOUTH DAILY   timolol 0.5 % ophthalmic solution Commonly known as: TIMOPTIC   timolol 0.5 % ophthalmic solution Commonly known as: BETIMOL 1 drop 2 (two) times daily.   Vitamin D 50 MCG (2000 UT) tablet Take 2,000 Units by mouth daily.        DISCHARGE INSTRUCTIONS:   Follow-up PMD 5 days  If you experience worsening of your admission symptoms, develop shortness of breath, life threatening emergency, suicidal or homicidal thoughts you must seek medical attention immediately by calling 911 or calling your MD immediately  if  symptoms less severe.  You Must read complete instructions/literature along with all the possible adverse reactions/side effects for all the Medicines you take and that have been prescribed to you. Take any new Medicines after you have completely understood and accept all the possible adverse reactions/side effects.   Please note  You were cared for by a hospitalist during your hospital stay. If you have any questions about your discharge medications or the care you received while you were in the hospital after you are discharged, you can call the unit and asked to speak with the hospitalist on call if the hospitalist that took care of you is not available. Once you are discharged, your primary care physician will handle any further medical issues. Please note that NO REFILLS for any discharge medications will be authorized once you are discharged, as it is imperative that you return to your primary care physician (or establish a relationship with a primary care physician if you do not have one) for your aftercare needs so that they can reassess your need for medications and monitor your lab values.    Today   CHIEF COMPLAINT:   Chief Complaint  Patient presents with  . Altered Mental Status    HISTORY OF PRESENT ILLNESS:  Angela Patterson  is a 87 y.o. female brought in with acute metabolic encephalopathy   VITAL SIGNS:  Blood pressure 140/75, pulse 73, temperature 98.4 F (36.9 C), resp. rate 20, height 5\' 2"  (1.575 m), weight 47 kg, last menstrual period 07/21/1966, SpO2 96 %.  I/O:    Intake/Output Summary (Last 24 hours) at 10/19/2020 1345 Last data filed at 10/19/2020 0600 Gross per 24 hour  Intake 946 ml  Output --  Net 946 ml    PHYSICAL EXAMINATION:  GENERAL:  87 y.o.-year-old patient lying in the bed with no acute distress.  EYES: Pupils equal, round, reactive to light and accommodation. No scleral icterus. Extraocular muscles intact.  HEENT: Head atraumatic, normocephalic.  Oropharynx and nasopharynx clear.  LUNGS: Normal breath sounds bilaterally, no wheezing, rales,rhonchi or crepitation. No use of accessory muscles of respiration.  CARDIOVASCULAR: S1, S2 normal. No murmurs, rubs, or gallops.  ABDOMEN: Soft, non-tender, non-distended. Bowel sounds present. No organomegaly or mass.  EXTREMITIES: No pedal edema, cyanosis, or clubbing.  PSYCHIATRIC: The patient is alert and oriented x 3.  SKIN: No obvious rash, lesion, or ulcer.   DATA REVIEW:   CBC Recent Labs  Lab 10/19/20 0202  WBC 11.2*  HGB 13.1  HCT 40.3  PLT 137*    Chemistries  Recent Labs  Lab 10/19/20 0202  NA 137  K 3.9  CL 104  CO2 21*  GLUCOSE 158*  BUN 14  CREATININE 0.50  0.49  CALCIUM 8.8*  AST 23  ALT 17  ALKPHOS 54  BILITOT 0.5    Microbiology Results  Results for orders placed or performed during the hospital encounter of 10/18/20  Blood culture (  routine x 2)     Status: None (Preliminary result)   Collection Time: 10/18/20  9:15 PM   Specimen: BLOOD  Result Value Ref Range Status   Specimen Description BLOOD RIGHT ANTECUBITAL  Final   Special Requests   Final    BOTTLES DRAWN AEROBIC AND ANAEROBIC Blood Culture results may not be optimal due to an inadequate volume of blood received in culture bottles   Culture   Final    NO GROWTH < 12 HOURS Performed at [facility], [address] [place], [place] [address]    Report Status PENDING  Incomplete  Blood culture (routine x 2)     Status: None (Preliminary result)   Collection Time: 10/18/20  9:15 PM   Specimen: BLOOD  Result Value Ref Range Status   Specimen Description BLOOD BLOOD RIGHT FOREARM  Final   Special Requests   Final    BOTTLES DRAWN AEROBIC AND ANAEROBIC Blood Culture results may not be optimal due to an inadequate volume of blood received in culture bottles   Culture   Final    NO GROWTH < 12 HOURS Performed at [facility], [address], [place], [place] [address]     Report Status PENDING  Incomplete  Resp Panel by RT-PCR (Flu A&B, Covid) Nasopharyngeal Swab     Status: None   Collection Time: 10/18/20  9:18 PM   Specimen: Nasopharyngeal Swab; Nasopharyngeal(NP) swabs in vial transport medium  Result Value Ref Range Status   SARS Coronavirus 2 by RT PCR NEGATIVE NEGATIVE Final    Comment: (NOTE) SARS-CoV-2 target nucleic acids are NOT DETECTED.  The SARS-CoV-2 RNA is generally detectable in upper respiratory specimens during the acute phase of infection. The lowest concentration of SARS-CoV-2 viral copies this assay can detect is 138 copies/mL. A negative result does not preclude SARS-Cov-2 infection and should not be used as the sole basis for treatment or other patient management decisions. A negative result may occur with  improper specimen collection/handling, submission of specimen other than nasopharyngeal swab, presence of viral mutation(s) within the areas targeted by this assay, and inadequate number of viral copies(<138 copies/mL). A negative result must be combined with clinical observations, patient history, and epidemiological information. The expected result is Negative.  Fact Sheet for Patients:  EntrepreneurPulse.com.au  Fact Sheet for Healthcare Providers:  IncredibleEmployment.be  This test is no t yet approved or cleared by the [place] FDA and  has been authorized for detection and/or diagnosis of SARS-CoV-2 by FDA under an Emergency Use Authorization (EUA). This EUA will remain  in effect (meaning this test can be used) for the duration of the COVID-19 declaration under Section 564(b)(1) of the Act, 21 U.S.C.section 360bbb-3(b)(1), unless the authorization is terminated  or revoked sooner.       Influenza A by PCR NEGATIVE NEGATIVE Final   Influenza B by PCR NEGATIVE NEGATIVE Final    Comment: (NOTE) The Xpert Xpress SARS-CoV-2/FLU/RSV plus assay is intended as an aid in the  diagnosis of influenza from Nasopharyngeal swab specimens and should not be used as a sole basis for treatment. Nasal washings and aspirates are unacceptable for Xpert Xpress SARS-CoV-2/FLU/RSV testing.  Fact Sheet for Patients: EntrepreneurPulse.com.au  Fact Sheet for Healthcare Providers: IncredibleEmployment.be  This test is not yet approved or cleared by the [place] FDA and has been authorized for detection and/or diagnosis of SARS-CoV-2 by FDA under an Emergency Use Authorization (EUA). This EUA will remain in effect (meaning this test can  be used) for the duration of the COVID-19 declaration under Section 564(b)(1) of the Act, 21 U.S.C. section 360bbb-3(b)(1), unless the authorization is terminated or revoked.  Performed at [facility], [address] [place], [place] [address]   Culture, blood (routine x 2) Call MD if unable to obtain prior to antibiotics being given     Status: None (Preliminary result)   Collection Time: 10/19/20  5:42 AM   Specimen: BLOOD  Result Value Ref Range Status   Specimen Description BLOOD RIGHT A   Final   Special Requests   Final    BOTTLES DRAWN AEROBIC AND ANAEROBIC Blood Culture adequate volume   Culture   Final    NO GROWTH <12 HOURS Performed at [facility], [address] [place], [place] [address]    Report Status PENDING  Incomplete  Culture, blood (routine x 2) Call MD if unable to obtain prior to antibiotics being given     Status: None (Preliminary result)   Collection Time: 10/19/20  5:42 AM   Specimen: BLOOD  Result Value Ref Range Status   Specimen Description BLOOD  RIGHT HAND  Final   Special Requests   Final    BOTTLES DRAWN AEROBIC AND ANAEROBIC Blood Culture adequate volume   Culture   Final    NO GROWTH <12 HOURS Performed at [facility], [address] [place],  [address]    Report Status PENDING  Incomplete    RADIOLOGY:  CT  Head Wo Contrast  Result Date: 10/18/2020 CLINICAL DATA:  Encephalopathy EXAM: CT HEAD WITHOUT CONTRAST TECHNIQUE: Contiguous axial images were obtained from the base of the skull through the vertex without intravenous contrast. COMPARISON:  None. FINDINGS: Brain: There is no mass, hemorrhage or extra-axial collection. There is generalized atrophy without lobar predilection. Hypodensity of the white matter is most commonly associated with chronic microvascular disease. Old small vessel infarcts of the basal ganglia on the right. Vascular: Atherosclerotic calcification of the vertebral and internal carotid arteries at the skull base. No abnormal hyperdensity of the major intracranial arteries or dural venous sinuses. Skull: The visualized skull base, calvarium and extracranial soft tissues are normal. Sinuses/Orbits: No fluid levels or advanced mucosal thickening of the visualized paranasal sinuses. No mastoid or middle ear effusion. The orbits are normal. IMPRESSION: Chronic small vessel disease and generalized volume loss without acute abnormality. Electronically Signed   By: Ulyses Jarred M.D.   On: 10/18/2020 22:03   DG Chest Portable 1 View  Result Date: 10/18/2020 CLINICAL DATA:  Altered mental status.  Evaluate for infiltrate. EXAM: PORTABLE CHEST 1 VIEW COMPARISON:  One-view chest x-ray 04/16/2017 FINDINGS: Heart is enlarged. Atherosclerotic changes are noted at the aortic arch. No edema or effusion is present. Asymmetric scratched at changes of COPD are again noted. Asymmetric right lower lobe airspace disease is present. IMPRESSION: 1. Asymmetric right lower lobe airspace disease concerning for pneumonia. 2. Stable changes of COPD. 3. Cardiomegaly without failure 4. Aortic atherosclerosis. Electronically Signed   By: San Morelle M.D.   On: 10/18/2020 20:28   DG Foot Complete Left  Result Date: 10/19/2020 CLINICAL DATA:  Healing wound under left great toe EXAM: LEFT FOOT - COMPLETE 3+ VIEW  COMPARISON:  None. FINDINGS: No visible bone destruction to suggest acute osteomyelitis. No acute fracture, subluxation or dislocation. Soft tissues are intact. IMPRESSION: No acute bony abnormality. Electronically Signed   By: Rolm Baptise M.D.   On: 10/19/2020 00:16      Management  plans discussed with the patient, family and they are in agreement.  CODE STATUS:     Code Status Orders  (From admission, onward)         Start     Ordered   10/18/20 2347  Full code  Continuous        10/18/20 2350        Code Status History    This patient has a current code status but no historical code status.   Advance Care Planning Activity      TOTAL TIME TAKING CARE OF THIS PATIENT: 35 minutes.    Loletha Grayer M.D on 10/19/2020 at 1:45 PM  Between 7am to 6pm - Pager - [phone]  After 6pm go to [email or web address] - password [email or web address]  [facility]  CC: Primary care physician; Einar Pheasant, MD

## 2020-10-20 ENCOUNTER — Encounter: Payer: Self-pay | Admitting: Internal Medicine

## 2020-10-20 NOTE — Assessment & Plan Note (Signed)
Blood pressure has been under good control.  Follow pressures.  Follow metabolic panel.  Continue amlodipine.

## 2020-10-20 NOTE — Assessment & Plan Note (Signed)
Continue zoloft.  Doing well.  Follow.

## 2020-10-20 NOTE — Assessment & Plan Note (Signed)
On crestor - taking 2 days per week.  Tolerating.  Follow lipid panel and liver function tests.  Hold on making changes.

## 2020-10-20 NOTE — Assessment & Plan Note (Signed)
Sugars doing ok.  a1c - 7.2.  Follow met b and a1c.

## 2020-10-20 NOTE — Assessment & Plan Note (Signed)
Continue plavix °

## 2020-10-20 NOTE — Assessment & Plan Note (Signed)
She is eating. Weight is up.  Follow.  Continue to encourage increased po intake.

## 2020-10-20 NOTE — Assessment & Plan Note (Signed)
Continues to follow up with podiatry.

## 2020-10-20 NOTE — Assessment & Plan Note (Signed)
Follow cbc.  Will have home health draw.

## 2020-10-20 NOTE — Assessment & Plan Note (Signed)
On crestor.  Follow lipid panel and liver function tests.   

## 2020-10-21 ENCOUNTER — Telehealth: Payer: Self-pay

## 2020-10-21 LAB — URINE CULTURE: Culture: 10000 — AB

## 2020-10-21 NOTE — Telephone Encounter (Signed)
Patient has been set up for HFU

## 2020-10-21 NOTE — Telephone Encounter (Signed)
Transition Care Management Follow-up Telephone Call  Date of discharge and from where: 10/19/20 from [facility]  How have you been since you were released from the hospital? Information received from daughter, HIPAA compliant. Notes patient is doing much better, but not yet at baseline. Episode Saturday afternoon with confusion to location.  Difficulty remembering long term memories. Denies slurring, headache, pain, shortness of breath and all other symptoms.   Any questions or concerns? Yes wants to discuss labs.  Items Reviewed:  Did the pt receive and understand the discharge instructions provided? Yes   Medications obtained and verified? Yes . Metformin discontinued. Taking 2 antibiotics for pneumonia and possible UTI. All other medications as scheduled.   Any new allergies since your discharge? No   Dietary orders reviewed? Yes, diabetic/low carb  Do you have support at home? Yes    Home Care and Equipment/Supplies: Were home health services ordered? Resume Home Health  Functional Questionnaire: (I = Independent and D = Dependent) ADLs: Family assist as needed  Transferring/Ambulation- walker  Follow up appointments reviewed:   PCP Hospital f/u appt confirmed? Yes  Scheduled to see Dr. Nicki Reaper on 10/23/20 @ 9:30 virtual visit [phone].   Are transportation arrangements needed? No   If their condition worsens, is the pt aware to call PCP or go to the Emergency Dept.? Yes  Was the patient provided with contact information for the PCP's office or ED? Yes  Was to pt encouraged to call back with questions or concerns? Yes

## 2020-10-23 ENCOUNTER — Telehealth (INDEPENDENT_AMBULATORY_CARE_PROVIDER_SITE_OTHER): Payer: Medicare Other | Admitting: Internal Medicine

## 2020-10-23 ENCOUNTER — Ambulatory Visit: Payer: Medicare Other | Admitting: Internal Medicine

## 2020-10-23 VITALS — Ht 62.0 in | Wt 102.0 lb

## 2020-10-23 DIAGNOSIS — E11621 Type 2 diabetes mellitus with foot ulcer: Secondary | ICD-10-CM

## 2020-10-23 DIAGNOSIS — Z8673 Personal history of transient ischemic attack (TIA), and cerebral infarction without residual deficits: Secondary | ICD-10-CM | POA: Diagnosis not present

## 2020-10-23 DIAGNOSIS — R531 Weakness: Secondary | ICD-10-CM

## 2020-10-23 DIAGNOSIS — J189 Pneumonia, unspecified organism: Secondary | ICD-10-CM

## 2020-10-23 DIAGNOSIS — E7800 Pure hypercholesterolemia, unspecified: Secondary | ICD-10-CM | POA: Diagnosis not present

## 2020-10-23 DIAGNOSIS — E872 Acidosis, unspecified: Secondary | ICD-10-CM

## 2020-10-23 DIAGNOSIS — L97529 Non-pressure chronic ulcer of other part of left foot with unspecified severity: Secondary | ICD-10-CM | POA: Diagnosis not present

## 2020-10-23 DIAGNOSIS — I10 Essential (primary) hypertension: Secondary | ICD-10-CM | POA: Diagnosis not present

## 2020-10-23 DIAGNOSIS — J181 Lobar pneumonia, unspecified organism: Secondary | ICD-10-CM

## 2020-10-23 DIAGNOSIS — L97509 Non-pressure chronic ulcer of other part of unspecified foot with unspecified severity: Secondary | ICD-10-CM

## 2020-10-23 DIAGNOSIS — F419 Anxiety disorder, unspecified: Secondary | ICD-10-CM

## 2020-10-23 DIAGNOSIS — D696 Thrombocytopenia, unspecified: Secondary | ICD-10-CM

## 2020-10-23 LAB — CULTURE, BLOOD (ROUTINE X 2)
Culture: NO GROWTH
Culture: NO GROWTH

## 2020-10-23 NOTE — Progress Notes (Signed)
Patient ID: Angela Patterson, female   DOB: April 01, 1921, 87 y.o.   MRN: [id number]   Virtual Visit via video Note  This visit type was conducted due to national recommendations for restrictions regarding the COVID-19 pandemic (e.g. social distancing).  This format is felt to be most appropriate for this patient at this time.  All issues noted in this document were discussed and addressed.  No physical exam was performed (except for noted visual exam findings with Video Visits).   I connected with [name] by a video enabled telemedicine application and verified that I am speaking with the correct person using two identifiers. Location patient: home Location provider: work Persons participating in the virtual visit: patient, provider, pts daughter [name]) and another daughter and son.   The limitations, risks, security and privacy concerns of performing an evaluation and management service by video and the availability of in person appointments have been discussed.  It has also been discussed with the patient that there may be a patient responsible charge related to this service. The patient expressed understanding and agreed to proceed.   Reason for visit: hospital follow up.   HPI: She was admitted 10/18/20 - 10/19/20 with acute metabolic encephalopathy and lobar pneumonia.  Was started on rocephin and zithromax.  With improvement, was changed to oral omnicef and continued on azithromycin.  Was also found to have lactic acidosis.  The hospitalist raised question if related to metformin.  She has been on metformin for years.  Found in setting of pneumonia/infection.  Was given IV fluids.  Elected to hold restarting metformin.  Discussed with family.  Initially Ms Hickey was confused and reported to be slurring her words.  CT scan head negative.  As infection treated, she returned to her baseline with no residual neurological deficits.  Elected to remain on plavix. Since her discharge, she has improved.  Mental state seems to be back to baseline.  Eating. No nausea or vomiting.  No cough or congestion.  Breathing stable.  No chest pain.  Bowels moving.  No diarrhea.  Started back on probiotics.  Since off metformin, sugars are elevated - 225 this am fasting and 349 - two hours after lunch.  Discussed will need f/u cxr.  She currently has home health for continued OT.  Discussed restarting PT.  Will contact [facility].  Also discussed the need for f/u cxr.    ROS: See pertinent positives and negatives per HPI  Past Medical History:  Diagnosis Date  . Allergy   . Chicken pox   . CVA (cerebral vascular accident) ([place])   . Diabetes mellitus ([place])   . Hypercholesterolemia   . Hypertension     Past Surgical History:  Procedure Laterality Date  . ABDOMINAL HYSTERECTOMY  1971   excessive bleeding  . APPENDECTOMY  age 6  . BREAST LUMPECTOMY  1958   benign  . DILATION AND CURETTAGE OF UTERUS  1970    Family History  Problem Relation Age of Onset  . Liver cancer Father   . Stroke Mother   . Heart attack Brother   . Hypertension Daughter   . Hypertension Son   . Hypertension Daughter   . Cancer Grandchild        breast  . Diabetes Grandchild   . Breast cancer Neg Hx   . Colon cancer Neg Hx     SOCIAL HX: reviewed.    Current Outpatient Medications:  .  amLODipine (NORVASC) 5 MG tablet, TAKE 1 TABLET BY  MOUTH  TWICE DAILY, Disp: 180 tablet, Rfl: 3 .  Cholecalciferol (VITAMIN D) 2000 units tablet, Take 2,000 Units by mouth daily. , Disp: , Rfl:  .  clopidogrel (PLAVIX) 75 MG tablet, TAKE 1 TABLET(75 MG) BY MOUTH DAILY, Disp: 90 tablet, Rfl: 1 .  glucose blood test strip, USE TO CHECK BLOOD SUGAR  TWO TIMES DAILY. Dx E11.9, Disp: 200 each, Rfl: 1 .  glucose blood test strip, OneTouch Ultra Test strips, Disp: , Rfl:  .  Lancet Devices (ONE TOUCH DELICA LANCING DEV) MISC, Use twice daily Dx: 250.00, Disp: 100 each, Rfl: 5 .  OMEGA-3 FATTY ACIDS PO, Take 720 mg by mouth daily., Disp:  , Rfl:  .  rosuvastatin (CRESTOR) 5 MG tablet, TAKE 1 TABLET(5 MG) BY MOUTH 2 TIMES A WEEK (Patient taking differently: TAKE 1 TABLET(5 MG) BY MOUTH 2 TIMES A WEEK. On Saturday and Wednesday), Disp: 26 tablet, Rfl: 1 .  sertraline (ZOLOFT) 25 MG tablet, TAKE 1 TO 2 TABLETS BY MOUTH DAILY, Disp: 180 tablet, Rfl: 1 .  timolol (BETIMOL) 0.5 % ophthalmic solution, 1 drop 2 (two) times daily., Disp: , Rfl:  .  timolol (TIMOPTIC) 0.5 % ophthalmic solution, , Disp: , Rfl:   EXAM:  GENERAL: alert, oriented, appears well and in no acute distress  HEENT: atraumatic, conjunttiva clear, no obvious abnormalities on inspection of external nose and ears  NECK: normal movements of the head and neck  LUNGS: on inspection no signs of respiratory distress, breathing rate appears normal, no obvious gross SOB, gasping or wheezing  CV: no obvious cyanosis  PSYCH/NEURO: pleasant and cooperative, no obvious depression or anxiety, speech and thought processing grossly intact  ASSESSMENT AND PLAN:  Discussed the following assessment and plan:  Problem List Items Addressed This Visit    Anxiety    Continue zoloft.  Stable.       CAP (community acquired pneumonia) - Primary   Relevant Orders   DG Chest 2 View   Foot ulcer ([place])    Stable.  Followed by podiatry.       History of CVA (cerebrovascular accident)    Continue plavix.  Discussed recent slurring of words - feel related to infection.  Back to baseline now.  CT head normal. Discussed further w/up and evaluation.  Elected to monitor.  Continue plavix.  Follow.       Hypertension    Blood pressure has been doing well on amlodipine.  Follow pressures.  Follow metabolic panel.       Lactic acidosis    Improved with hydration.  Off metformin.  Remain off metformin.        Lobar pneumonia ([place])    Pneumonia noted on cxr.  Taking oral abx.  Started back on probiotics.  Will need f/u cxr to confirm clearance.       Pure hypercholesterolemia     On crestor.  Follow lipid panel and liver function tests.       Thrombocytopenia ([place])    Follow cbc. Platelet count 137 on recent check.        Type 2 diabetes mellitus ([place])    Since being off metformin, sugars elevated.  Given lactic acidosis, will remain off metformin.  Start jardiance.  Samples of 88m given.  Discussed importance of staying hydrated.  Follow sugars.  Discussed if sick, holding jardiance.  Follow met b and a1c.       Weakness    Just admitted and treated for UTI/pneumonia.  Home health  is seeing for continued OT.  Given recent admission, weakness, will have PT come back and evaluate and treat.  Notify home health.           I discussed the assessment and treatment plan with the patient. The patient was provided an opportunity to ask questions and all were answered. The patient agreed with the plan and demonstrated an understanding of the instructions.   The patient was advised to call back or seek an in-person evaluation if the symptoms worsen or if the condition fails to improve as anticipated.   Einar Pheasant, MD

## 2020-10-24 DIAGNOSIS — E11621 Type 2 diabetes mellitus with foot ulcer: Secondary | ICD-10-CM | POA: Diagnosis not present

## 2020-10-24 DIAGNOSIS — L97522 Non-pressure chronic ulcer of other part of left foot with fat layer exposed: Secondary | ICD-10-CM | POA: Diagnosis not present

## 2020-10-24 DIAGNOSIS — Z7984 Long term (current) use of oral hypoglycemic drugs: Secondary | ICD-10-CM | POA: Diagnosis not present

## 2020-10-24 DIAGNOSIS — Z4801 Encounter for change or removal of surgical wound dressing: Secondary | ICD-10-CM | POA: Diagnosis not present

## 2020-10-24 DIAGNOSIS — I10 Essential (primary) hypertension: Secondary | ICD-10-CM | POA: Diagnosis not present

## 2020-10-24 DIAGNOSIS — E1140 Type 2 diabetes mellitus with diabetic neuropathy, unspecified: Secondary | ICD-10-CM | POA: Diagnosis not present

## 2020-10-24 LAB — CULTURE, BLOOD (ROUTINE X 2)
Culture: NO GROWTH
Culture: NO GROWTH
Special Requests: ADEQUATE
Special Requests: ADEQUATE

## 2020-10-27 ENCOUNTER — Telehealth: Payer: Self-pay | Admitting: Internal Medicine

## 2020-10-27 ENCOUNTER — Encounter: Payer: Self-pay | Admitting: Internal Medicine

## 2020-10-27 DIAGNOSIS — R531 Weakness: Secondary | ICD-10-CM | POA: Insufficient documentation

## 2020-10-27 NOTE — Assessment & Plan Note (Signed)
Pneumonia noted on cxr.  Taking oral abx.  Started back on probiotics.  Will need f/u cxr to confirm clearance.

## 2020-10-27 NOTE — Telephone Encounter (Signed)
Please notify [facility], pt recent admitted and treated for pneumonia.  Has OT already coming out.  Add PT to evaluate and treat.  Also needs cxr in 4-6 weeks. Order in.

## 2020-10-27 NOTE — Assessment & Plan Note (Signed)
Continue plavix.  Discussed recent slurring of words - feel related to infection.  Back to baseline now.  CT head normal. Discussed further w/up and evaluation.  Elected to monitor.  Continue plavix.  Follow.

## 2020-10-27 NOTE — Assessment & Plan Note (Signed)
On crestor.  Follow lipid panel and liver function tests.   

## 2020-10-27 NOTE — Assessment & Plan Note (Signed)
Improved with hydration.  Off metformin.  Remain off metformin.

## 2020-10-27 NOTE — Assessment & Plan Note (Signed)
Continue zoloft.  Stable.  

## 2020-10-27 NOTE — Assessment & Plan Note (Signed)
Follow cbc. Platelet count 137 on recent check.

## 2020-10-27 NOTE — Assessment & Plan Note (Addendum)
Just admitted and treated for UTI/pneumonia.  Home health is seeing for continued OT.  Given recent admission, weakness, will have PT come back and evaluate and treat.  Notify home health.

## 2020-10-27 NOTE — Assessment & Plan Note (Signed)
Blood pressure has been doing well on amlodipine.  Follow pressures.  Follow metabolic panel.

## 2020-10-27 NOTE — Assessment & Plan Note (Signed)
Stable.  Followed by podiatry.

## 2020-10-27 NOTE — Assessment & Plan Note (Signed)
Since being off metformin, sugars elevated.  Given lactic acidosis, will remain off metformin.  Start jardiance.  Samples of 41m given.  Discussed importance of staying hydrated.  Follow sugars.  Discussed if sick, holding jardiance.  Follow met b and a1c.

## 2020-10-29 NOTE — Telephone Encounter (Signed)
They are scheduled for 5/24 but daughter thinks that podiatry follow up is going to be that week or the week after. She is going to call back after they see Dr Amalia Hailey on 4/19 and make the cxr same day as podiatry follow up if the two appts are close together. If not she will leave scheduled as Is.

## 2020-10-29 NOTE — Telephone Encounter (Signed)
Order printed to add PT and patient scheduled for a f/u cxr.

## 2020-10-30 ENCOUNTER — Telehealth: Payer: Self-pay

## 2020-10-30 DIAGNOSIS — Z4801 Encounter for change or removal of surgical wound dressing: Secondary | ICD-10-CM | POA: Diagnosis not present

## 2020-10-30 DIAGNOSIS — Z7984 Long term (current) use of oral hypoglycemic drugs: Secondary | ICD-10-CM | POA: Diagnosis not present

## 2020-10-30 DIAGNOSIS — E1140 Type 2 diabetes mellitus with diabetic neuropathy, unspecified: Secondary | ICD-10-CM | POA: Diagnosis not present

## 2020-10-30 DIAGNOSIS — E11621 Type 2 diabetes mellitus with foot ulcer: Secondary | ICD-10-CM | POA: Diagnosis not present

## 2020-10-30 DIAGNOSIS — L97522 Non-pressure chronic ulcer of other part of left foot with fat layer exposed: Secondary | ICD-10-CM | POA: Diagnosis not present

## 2020-10-30 DIAGNOSIS — I10 Essential (primary) hypertension: Secondary | ICD-10-CM | POA: Diagnosis not present

## 2020-10-30 NOTE — Telephone Encounter (Signed)
Ben, a PT with [facility] called and stated that he evaluated the patient and there is no need for follow up PT at this time. FYI

## 2020-10-30 NOTE — Telephone Encounter (Signed)
Angela Patterson stated that she has met all the goals that he had for her and that she is functioning at a point where she is safe. He did state that another PT evaluation can be done again if needed at a later date but right now nothing further is needed.

## 2020-10-30 NOTE — Telephone Encounter (Signed)
Noted  

## 2020-11-01 DIAGNOSIS — I10 Essential (primary) hypertension: Secondary | ICD-10-CM | POA: Diagnosis not present

## 2020-11-01 DIAGNOSIS — Z4801 Encounter for change or removal of surgical wound dressing: Secondary | ICD-10-CM | POA: Diagnosis not present

## 2020-11-01 DIAGNOSIS — E1140 Type 2 diabetes mellitus with diabetic neuropathy, unspecified: Secondary | ICD-10-CM | POA: Diagnosis not present

## 2020-11-01 DIAGNOSIS — Z7984 Long term (current) use of oral hypoglycemic drugs: Secondary | ICD-10-CM | POA: Diagnosis not present

## 2020-11-01 DIAGNOSIS — E11621 Type 2 diabetes mellitus with foot ulcer: Secondary | ICD-10-CM | POA: Diagnosis not present

## 2020-11-01 DIAGNOSIS — L97522 Non-pressure chronic ulcer of other part of left foot with fat layer exposed: Secondary | ICD-10-CM | POA: Diagnosis not present

## 2020-11-05 ENCOUNTER — Other Ambulatory Visit: Payer: Self-pay

## 2020-11-05 ENCOUNTER — Ambulatory Visit (INDEPENDENT_AMBULATORY_CARE_PROVIDER_SITE_OTHER): Payer: Medicare Other | Admitting: Podiatry

## 2020-11-05 DIAGNOSIS — L97522 Non-pressure chronic ulcer of other part of left foot with fat layer exposed: Secondary | ICD-10-CM

## 2020-11-05 DIAGNOSIS — I70245 Atherosclerosis of native arteries of left leg with ulceration of other part of foot: Secondary | ICD-10-CM | POA: Diagnosis not present

## 2020-11-05 DIAGNOSIS — E0843 Diabetes mellitus due to underlying condition with diabetic autonomic (poly)neuropathy: Secondary | ICD-10-CM

## 2020-11-06 DIAGNOSIS — Z8631 Personal history of diabetic foot ulcer: Secondary | ICD-10-CM | POA: Diagnosis not present

## 2020-11-06 DIAGNOSIS — R531 Weakness: Secondary | ICD-10-CM | POA: Diagnosis not present

## 2020-11-06 DIAGNOSIS — Z7984 Long term (current) use of oral hypoglycemic drugs: Secondary | ICD-10-CM | POA: Diagnosis not present

## 2020-11-06 DIAGNOSIS — Z741 Need for assistance with personal care: Secondary | ICD-10-CM | POA: Diagnosis not present

## 2020-11-06 DIAGNOSIS — I10 Essential (primary) hypertension: Secondary | ICD-10-CM | POA: Diagnosis not present

## 2020-11-06 DIAGNOSIS — E1140 Type 2 diabetes mellitus with diabetic neuropathy, unspecified: Secondary | ICD-10-CM | POA: Diagnosis not present

## 2020-11-06 DIAGNOSIS — F419 Anxiety disorder, unspecified: Secondary | ICD-10-CM | POA: Diagnosis not present

## 2020-11-06 DIAGNOSIS — Z8673 Personal history of transient ischemic attack (TIA), and cerebral infarction without residual deficits: Secondary | ICD-10-CM | POA: Diagnosis not present

## 2020-11-06 NOTE — Progress Notes (Signed)
   Subjective:  87 year old female with PMHx of DM presenting today for follow up evaluation of an ulceration of the left foot.  Has been presenting to the office for routine conservative management of the wound.  Both the patient and her family have opted for conservative management and to avoid any surgical intervention to help alleviate any symptoms the patient may have.  They present today for routine treatment  Past Medical History:  Diagnosis Date  . Allergy   . Chicken pox   . CVA (cerebral vascular accident) ([place])   . Diabetes mellitus ([place])   . Hypercholesterolemia   . Hypertension       Objective/Physical Exam General: The patient is alert and oriented x3 in no acute distress.  Dermatology:  Today it appears that the wound to the plantar aspect of the foot has recently healed.  Complete reepithelialization has occurred.  The area is very callused however there is no open wound noted.   Skin is warm, dry and supple bilateral lower extremities.  Hyperkeratotic thickened elongated discolored dystrophic nails noted 1-5 bilateral  Vascular: Palpable pedal pulses bilaterally.  Erythema resolved. Capillary refill within normal limits.  Neurological: Epicritic and protective threshold diminished bilaterally.   Musculoskeletal Exam: Range of motion within normal limits to all pedal and ankle joints bilateral. Muscle strength 5/5 in all groups bilateral.   Assessment: #1 ulceration of the sub-first MPJ of the left foot secondary to diabetes mellitus #2 diabetes mellitus w/ peripheral neuropathy  Plan of Care:  #1 Patient was evaluated. #2  Light debridement was performed of the callus tissue around the recently healed wound area.  No significant bleeding noted. 3.  Continue Silvadene cream daily 4.  Recommend lotion daily.  Lotion was provided today for the patient 5.  Continue diabetic shoes and insoles with offloading felt metatarsal pads  6.  Return to clinic in 3 months for  routine foot care   [name], DPM [facility]  Dr. [name], DPM    [address], [place] [address]                Office [phone]  Fax [phone]

## 2020-11-07 DIAGNOSIS — E1140 Type 2 diabetes mellitus with diabetic neuropathy, unspecified: Secondary | ICD-10-CM | POA: Diagnosis not present

## 2020-11-07 DIAGNOSIS — R531 Weakness: Secondary | ICD-10-CM | POA: Diagnosis not present

## 2020-11-07 DIAGNOSIS — I10 Essential (primary) hypertension: Secondary | ICD-10-CM | POA: Diagnosis not present

## 2020-11-07 DIAGNOSIS — F419 Anxiety disorder, unspecified: Secondary | ICD-10-CM | POA: Diagnosis not present

## 2020-11-07 DIAGNOSIS — Z7984 Long term (current) use of oral hypoglycemic drugs: Secondary | ICD-10-CM | POA: Diagnosis not present

## 2020-11-07 DIAGNOSIS — Z8631 Personal history of diabetic foot ulcer: Secondary | ICD-10-CM | POA: Diagnosis not present

## 2020-11-11 ENCOUNTER — Encounter: Payer: Self-pay | Admitting: Internal Medicine

## 2020-11-11 DIAGNOSIS — I10 Essential (primary) hypertension: Secondary | ICD-10-CM

## 2020-11-11 DIAGNOSIS — E7800 Pure hypercholesterolemia, unspecified: Secondary | ICD-10-CM

## 2020-11-11 DIAGNOSIS — E1140 Type 2 diabetes mellitus with diabetic neuropathy, unspecified: Secondary | ICD-10-CM

## 2020-11-11 MED ORDER — EMPAGLIFLOZIN 10 MG PO TABS: 10.0000 mg | ORAL_TABLET | Freq: Every day | ORAL | 2 refills | Status: DC

## 2020-11-11 NOTE — Telephone Encounter (Signed)
Orders placed for labs.  Ok for cxr same day as labs.  rx sent in for jardiance.

## 2020-11-12 DIAGNOSIS — R531 Weakness: Secondary | ICD-10-CM | POA: Diagnosis not present

## 2020-11-12 DIAGNOSIS — I10 Essential (primary) hypertension: Secondary | ICD-10-CM | POA: Diagnosis not present

## 2020-11-12 DIAGNOSIS — F419 Anxiety disorder, unspecified: Secondary | ICD-10-CM | POA: Diagnosis not present

## 2020-11-12 DIAGNOSIS — E1140 Type 2 diabetes mellitus with diabetic neuropathy, unspecified: Secondary | ICD-10-CM | POA: Diagnosis not present

## 2020-11-12 DIAGNOSIS — Z8631 Personal history of diabetic foot ulcer: Secondary | ICD-10-CM | POA: Diagnosis not present

## 2020-11-12 DIAGNOSIS — Z7984 Long term (current) use of oral hypoglycemic drugs: Secondary | ICD-10-CM | POA: Diagnosis not present

## 2020-11-12 NOTE — Telephone Encounter (Signed)
Noted on schedule to do labs and chest x-ray.

## 2020-11-14 DIAGNOSIS — F419 Anxiety disorder, unspecified: Secondary | ICD-10-CM | POA: Diagnosis not present

## 2020-11-14 DIAGNOSIS — E1140 Type 2 diabetes mellitus with diabetic neuropathy, unspecified: Secondary | ICD-10-CM | POA: Diagnosis not present

## 2020-11-14 DIAGNOSIS — Z7984 Long term (current) use of oral hypoglycemic drugs: Secondary | ICD-10-CM | POA: Diagnosis not present

## 2020-11-14 DIAGNOSIS — R531 Weakness: Secondary | ICD-10-CM | POA: Diagnosis not present

## 2020-11-14 DIAGNOSIS — I10 Essential (primary) hypertension: Secondary | ICD-10-CM | POA: Diagnosis not present

## 2020-11-14 DIAGNOSIS — Z8631 Personal history of diabetic foot ulcer: Secondary | ICD-10-CM | POA: Diagnosis not present

## 2020-11-15 ENCOUNTER — Ambulatory Visit (INDEPENDENT_AMBULATORY_CARE_PROVIDER_SITE_OTHER): Payer: Medicare Other | Admitting: Pharmacist

## 2020-11-15 DIAGNOSIS — I10 Essential (primary) hypertension: Secondary | ICD-10-CM

## 2020-11-15 DIAGNOSIS — D485 Neoplasm of uncertain behavior of skin: Secondary | ICD-10-CM | POA: Diagnosis not present

## 2020-11-15 DIAGNOSIS — E7800 Pure hypercholesterolemia, unspecified: Secondary | ICD-10-CM | POA: Diagnosis not present

## 2020-11-15 DIAGNOSIS — E1140 Type 2 diabetes mellitus with diabetic neuropathy, unspecified: Secondary | ICD-10-CM | POA: Diagnosis not present

## 2020-11-15 DIAGNOSIS — C44321 Squamous cell carcinoma of skin of nose: Secondary | ICD-10-CM | POA: Diagnosis not present

## 2020-11-15 DIAGNOSIS — Z8673 Personal history of transient ischemic attack (TIA), and cerebral infarction without residual deficits: Secondary | ICD-10-CM

## 2020-11-15 NOTE — Patient Instructions (Signed)
Visit Information   PATIENT GOALS:  Goals Addressed              This Visit's Progress     Patient Stated   .  Medication Monitoring (pt-stated)        Patient Goals/Self-Care Activities . Over the next 90 days, patient will:  - take medications as prescribed check glucose daily, document, and provide at future appointments collaborate with provider on medication access solutions       Consent to CCM Services: Angela Patterson was given information about Chronic Care Management services today including:  1. CCM service includes personalized support from designated clinical staff supervised by her physician, including individualized plan of care and coordination with other care providers 2. 24/7 contact phone numbers for assistance for urgent and routine care needs. 3. Service will only be billed when office clinical staff spend 20 minutes or more in a month to coordinate care. 4. Only one practitioner may furnish and bill the service in a calendar month. 5. The patient may stop CCM services at any time (effective at the end of the month) by phone call to the office staff. 6. The patient will be responsible for cost sharing (co-pay) of up to 20% of the service fee (after annual deductible is met).  Patient agreed to services and verbal consent obtained.   Patient verbalizes understanding of instructions provided today and agrees to view in [place].    Plan: Telephone follow up appointment with care management team member scheduled for:  ~ 1 week  Catie Darnelle Maffucci, PharmD, [place], CPP Clinical Pharmacist [place] at [facility] [place]: Patient Care Plan: Medication Management    Problem Identified: Diabetes, hx CVA     Long-Range Goal: Disease Progression Prevention   Start Date: 11/15/2020  This Visit's Progress: On track  Priority: High  Note:   Current Barriers:  . Unable to independently afford treatment regimen . Unable to  achieve control of diabetes   Pharmacist Clinical Goal(s):  Marland Kitchen Over the next 90 days, patient will verbalize ability to afford treatment regimen . Over the next 90 days, patient will achieve control of diabetes as evidenced by A1c through collaboration with PharmD and provider.   Interventions: . 1:1 collaboration with Einar Pheasant, MD regarding development and update of comprehensive plan of care as evidenced by provider attestation and co-signature . Inter-disciplinary care team collaboration (see longitudinal plan of care) . Comprehensive medication review performed; medication list updated in electronic medical record  Diabetes: . Uncontrolled; current treatment: Jardiance 10 mg daily . Patient's family reported today that Angela Patterson is going to be $238 per month o Hx metformin therapy. Discontinued at recent hospitalization due to lactic acidosis . Prepared sample for patient's family to pick up . Evaluated income for Jardiance patient assistance. Scheduled f/u telephone call for further evaluation in ~ 1 week  Hypertension: . Controlled; current treatment: amlodipine 5 mg daily . Will evaluate at future calls. Recommend to continue current regimen at this time.  Hyperlipidemia/ secondary ASCVD prevention . Appropriately managed; current treatment: rosuvastatin 5 mg 2 days weekly . Antiplatelet therapy: clopidogrel 75 mg daily (hx CVA)  . Will evaluate at future calls. Recommend to continue current regimen at this time.  Depression . Appropriately managed per last report to PCP; current treatment: sertraline 25 mg daily  . Will evaluate at future calls. Recommend to continue current regimen at this time.  Supplements: Vitamin D, omega 3 fatty acids daily  Patient Goals/Self-Care Activities . Over the next 90 days, patient will:  - take medications as prescribed check glucose daily, document, and provide at future appointments collaborate with provider on medication access  solutions  Follow Up Plan: Telephone follow up appointment with care management team member scheduled for: ~ 1 week

## 2020-11-15 NOTE — Chronic Care Management (AMB) (Signed)
Chronic Care Management Pharmacy Note  11/15/2020 Name:  Angela Patterson MRN:  [id number] DOB:  [birth year]  Subjective: Angela Patterson is an 87 y.o. year old female who is a primary patient of Einar Pheasant, MD.  The CCM team was consulted for assistance with disease management and care coordination needs.    Engaged with patient by telephone for follow up visit to re-engage in CCM services in response to provider referral for pharmacy case management and/or care coordination services.   Consent to Services:  The patient was given the following information about Chronic Care Management services today, agreed to services, and gave verbal consent: 1. CCM service includes personalized support from designated clinical staff supervised by the primary care provider, including individualized plan of care and coordination with other care providers 2. 24/7 contact phone numbers for assistance for urgent and routine care needs. 3. Service will only be billed when office clinical staff spend 20 minutes or more in a month to coordinate care. 4. Only one practitioner may furnish and bill the service in a calendar month. 5.The patient may stop CCM services at any time (effective at the end of the month) by phone call to the office staff. 6. The patient will be responsible for cost sharing (co-pay) of up to 20% of the service fee (after annual deductible is met). Patient agreed to services and consent obtained.  Patient Care Team: Einar Pheasant, MD as PCP - General (Internal Medicine) De Hollingshead, RPH-CPP (Pharmacist)  Recent office visits:  4/6 - PCP visit hospital f/u, started Jardiance  Recent consult visits: None  Hospital visits:  4/1-4/2 -  Hospitalization for acute metabolic encephalopathy w/ pneumonia, stopped metformin d/t lactic acidosis. Tx rocephin and zithromax, completed zithromax w/ omnicef   Objective:  Lab Results  Component Value Date   CREATININE 0.49 10/19/2020    CREATININE 0.50 10/19/2020   CREATININE 0.66 10/18/2020    Lab Results  Component Value Date   HGBA1C 7.2 (H) 06/12/2020   Last diabetic Eye exam:  Lab Results  Component Value Date/Time   HMDIABEYEEXA No Retinopathy 03/13/2019 12:00 AM    Last diabetic Foot exam:  Lab Results  Component Value Date/Time   HMDIABFOOTEX on my exam.  07/09/2014 12:00 AM        Component Value Date/Time   CHOL 175 06/12/2020 1332   TRIG 225.0 (H) 06/12/2020 1332   HDL 41.50 06/12/2020 1332   CHOLHDL 4 06/12/2020 1332   VLDL 45.0 (H) 06/12/2020 1332   LDLCALC 89 12/04/2019 0833   LDLDIRECT 107.0 06/12/2020 1332    Hepatic Function Latest Ref Rng & Units 10/19/2020 10/18/2020 08/14/2020  Total Protein 6.5 - 8.1 g/dL 6.9 7.5 7.5  Albumin 3.5 - 5.0 g/dL 3.5 3.9 4.2  AST 15 - 41 U/L 23 25 19   ALT 0 - 44 U/L 17 18 15   Alk Phosphatase 38 - 126 U/L 54 60 66  Total Bilirubin 0.3 - 1.2 mg/dL 0.5 0.6 0.7  Bilirubin, Direct 0.0 - 0.3 mg/dL - - 0.1    Lab Results  Component Value Date/Time   TSH 2.47 06/12/2020 01:32 PM   TSH 3.04 04/17/2019 08:58 AM    CBC Latest Ref Rng & Units 10/19/2020 10/18/2020 07/25/2019  WBC 4.0 - 10.5 K/uL 11.2(H) 10.1 8.3  Hemoglobin 12.0 - 15.0 g/dL 13.1 13.7 14.4  Hematocrit 36.0 - 46.0 % 40.3 41.1 43.0  Platelets 150 - 400 K/uL 137(L) 163 164.0    Clinical ASCVD: Yes  Social History   Tobacco Use  Smoking Status Never Smoker  Smokeless Tobacco Never Used   BP Readings from Last 3 Encounters:  10/19/20 140/75  06/12/20 138/70  02/20/19 138/70   Pulse Readings from Last 3 Encounters:  10/19/20 73  06/12/20 74  02/20/19 74   Wt Readings from Last 3 Encounters:  10/23/20 102 lb (46.3 kg)  10/18/20 103 lb 9.9 oz (47 kg)  07/15/20 102 lb (46.3 kg)    Assessment: Review of patient past medical history, allergies, medications, health status, including review of consultants reports, laboratory and other test data, was performed as part of comprehensive  evaluation and provision of chronic care management services.   SDOH:  (Social Determinants of Health) assessments and interventions performed:  SDOH Interventions   Flowsheet Row Most Recent Value  SDOH Interventions   Financial Strain Interventions Other (Comment)  [manufacturer assistance]      CCM Care Plan  Allergies  Allergen Reactions  . Actos [Pioglitazone] Swelling  . Contrast Media [Iodinated Diagnostic Agents] Swelling  . Prandin [Repaglinide] Swelling  . Pravastatin Sodium     cramps    Medications Reviewed Today    Reviewed by Einar Pheasant, MD (Physician) on 10/27/20 at 1637  Med List Status: <None>  Medication Order Taking? Sig Documenting Provider Last Dose Status Informant  amLODipine (NORVASC) 5 MG tablet [id number] No TAKE 1 TABLET BY MOUTH  TWICE DAILY Einar Pheasant, MD 10/18/2020 Unknown time Active Child  Cholecalciferol (VITAMIN D) 2000 units tablet [id number] No Take 2,000 Units by mouth daily.  [provider] 10/17/2020 PM Active Child  clopidogrel (PLAVIX) 75 MG tablet [id number] No TAKE 1 TABLET(75 MG) BY MOUTH DAILY Einar Pheasant, MD 10/18/2020 Unknown time Active Child  glucose blood test strip [id number] No USE TO CHECK BLOOD SUGAR  TWO TIMES DAILY. Dx E11.9 Einar Pheasant, MD Taking Active Child  glucose blood test strip [id number] No OneTouch Ultra Test strips [provider] Taking Active Child  Lancet Devices (ONE TOUCH DELICA LANCING DEV) MISC [id number] No Use twice daily Dx: 250.00 Einar Pheasant, MD Taking Active Child  OMEGA-3 FATTY ACIDS PO [id number] No Take 720 mg by mouth daily. [provider] 10/18/2020 Unknown time Active Child  rosuvastatin (CRESTOR) 5 MG tablet [id number] No TAKE 1 TABLET(5 MG) BY MOUTH 2 TIMES A WEEK  Patient taking differently: TAKE 1 TABLET(5 MG) BY MOUTH 2 TIMES A WEEK. On Saturday and Wednesday   Einar Pheasant, MD 10/16/2020 Active Child  sertraline (ZOLOFT) 25 MG tablet [id number] No TAKE 1  TO 2 TABLETS BY MOUTH DAILY Einar Pheasant, MD 10/17/2020 PM Active Child  timolol (BETIMOL) 0.5 % ophthalmic solution [id number] No 1 drop 2 (two) times daily. [provider] 10/17/2020 PM Active Child  timolol (TIMOPTIC) 0.5 % ophthalmic solution [id number] No  [provider] 10/17/2020 PM Active Child          Patient Active Problem List   Diagnosis Date Noted  . Weakness 10/27/2020  . Lobar pneumonia ([place])   . Lactic acidosis   . Acute metabolic encephalopathy   . CAP (community acquired pneumonia) 10/18/2020  . Cough 09/15/2020  . Finger lesion 06/13/2020  . Nasal lesion 06/13/2020  . Change in hearing 12/18/2019  . Finger laceration 02/21/2019  . Gait abnormality 11/13/2018  . Falls 08/28/2018  . Anxiety 06/22/2018  . Diarrhea 12/13/2017  . Rectal bleeding 07/07/2017  . Foot ulcer ([place]) 04/18/2017  . Open wound of foot excluding toes without complication 87/57/9728  .  Health care maintenance 10/27/2014  . Weight loss 12/27/2013  . Thrombocytopenia ([place]) 11/20/2012  . Hypertension 07/21/2012  . Hypercholesterolemia 07/21/2012  . History of CVA (cerebrovascular accident) 07/21/2012  . Diabetes mellitus ([place]) 07/21/2012  . Essential hypertension 07/21/2012  . Pure hypercholesterolemia 07/21/2012  . Type 2 diabetes mellitus ([place]) 07/21/2012    Immunization History  Administered Date(s) Administered  . DTaP 03/21/2012  . Fluad Quad(high Dose 65+) 04/19/2019, 06/12/2020  . Influenza Split 03/21/2012, 04/03/2014  . Influenza, High Dose Seasonal PF 04/30/2017, 05/02/2018  . Influenza-Unspecified 04/09/2015, 04/14/2016, 04/28/2017  . PFIZER(Purple Top)SARS-COV-2 Vaccination 08/10/2019, 08/31/2019  . Pneumococcal Conjugate-13 07/09/2014  . Pneumococcal Polysaccharide-23 07/09/2012  . Tdap 02/16/2019    Conditions to be addressed/monitored: HTN, HLD and DMII  Care Plan : Medication Management  Updates made by De Hollingshead, RPH-CPP since  11/15/2020 12:00 AM    Problem: Diabetes, hx CVA     Long-Range Goal: Disease Progression Prevention   Start Date: 11/15/2020  This Visit's Progress: On track  Priority: High  Note:   Current Barriers:  . Unable to independently afford treatment regimen . Unable to achieve control of diabetes   Pharmacist Clinical Goal(s):  Marland Kitchen Over the next 90 days, patient will verbalize ability to afford treatment regimen . Over the next 90 days, patient will achieve control of diabetes as evidenced by A1c through collaboration with PharmD and provider.   Interventions: . 1:1 collaboration with Einar Pheasant, MD regarding development and update of comprehensive plan of care as evidenced by provider attestation and co-signature . Inter-disciplinary care team collaboration (see longitudinal plan of care) . Comprehensive medication review performed; medication list updated in electronic medical record  Diabetes: . Uncontrolled; current treatment: Jardiance 10 mg daily . Patient's family reported today that [name] is going to be $238 per month o Hx metformin therapy. Discontinued at recent hospitalization due to lactic acidosis . Prepared sample for patient's family to pick up . Evaluated income for Jardiance patient assistance. Scheduled f/u telephone call for further evaluation in ~ 1 week  Hypertension: . Controlled; current treatment: amlodipine 5 mg daily . Will evaluate at future calls. Recommend to continue current regimen at this time.  Hyperlipidemia/ secondary ASCVD prevention . Appropriately managed; current treatment: rosuvastatin 5 mg 2 days weekly . Antiplatelet therapy: clopidogrel 75 mg daily (hx CVA)  . Will evaluate at future calls. Recommend to continue current regimen at this time.  Depression . Appropriately managed per last report to PCP; current treatment: sertraline 25 mg daily  . Will evaluate at future calls. Recommend to continue current regimen at this  time.  Supplements: Vitamin D, omega 3 fatty acids daily   Patient Goals/Self-Care Activities . Over the next 90 days, patient will:  - take medications as prescribed check glucose daily, document, and provide at future appointments collaborate with provider on medication access solutions  Follow Up Plan: Telephone follow up appointment with care management team member scheduled for: ~ 1 week      Medication Assistance: Evaluating for assistance  Patient's preferred pharmacy is:  [facility] [place], [address] [place] [address] Phone: [phone] Fax: [phone]  [place], [place], [address] [place], [place] [place] [address] [place] [address] Phone: [phone] Fax: [phone]   Follow Up:  Patient agrees to Care Plan and Follow-up.  Plan: Telephone follow  up appointment with care management team member scheduled for:  ~ 1 week  Catie Darnelle Maffucci, PharmD, [place], CPP Clinical Pharmacist [place] at [facility] [phone]  Medication Samples have been provided to the patient.  Drug name: Jardiance       Strength: 10 mg        Qty: 4  LOT: [id number]  Exp.Date: 09/23

## 2020-11-19 ENCOUNTER — Ambulatory Visit (INDEPENDENT_AMBULATORY_CARE_PROVIDER_SITE_OTHER): Payer: Medicare Other | Admitting: Pharmacist

## 2020-11-19 DIAGNOSIS — E7800 Pure hypercholesterolemia, unspecified: Secondary | ICD-10-CM | POA: Diagnosis not present

## 2020-11-19 DIAGNOSIS — I10 Essential (primary) hypertension: Secondary | ICD-10-CM

## 2020-11-19 DIAGNOSIS — E1140 Type 2 diabetes mellitus with diabetic neuropathy, unspecified: Secondary | ICD-10-CM | POA: Diagnosis not present

## 2020-11-19 DIAGNOSIS — Z8673 Personal history of transient ischemic attack (TIA), and cerebral infarction without residual deficits: Secondary | ICD-10-CM

## 2020-11-19 NOTE — Patient Instructions (Signed)
Visit Information  PATIENT GOALS: Goals Addressed              This Visit's Progress     Patient Stated   .  Medication Monitoring (pt-stated)        Patient Goals/Self-Care Activities . Over the next 90 days, patient will:  - take medications as prescribed check glucose daily, document, and provide at future appointments collaborate with provider on medication access solutions        The patient verbalized understanding of instructions, educational materials, and care plan provided today and declined offer to receive copy of patient instructions, educational materials, and care plan.   Plan: Telephone follow up appointment with care management team member scheduled for:  ~ 5 weeks  Catie Darnelle Maffucci, PharmD, [place], [place] Clinical Pharmacist [employer] at [employer] [phone]

## 2020-11-19 NOTE — Chronic Care Management (AMB) (Signed)
Chronic Care Management Pharmacy Note  11/19/2020 Name:  Angela Patterson MRN:  [id number] DOB:  [birth year]  Subjective: Angela Patterson is an 87 y.o. year old female who is a primary patient of Angela Pheasant, MD.  The CCM team was consulted for assistance with disease management and care coordination needs.    Engaged with patient's daughter, Angela Patterson, by telephone for follow up visit in response to provider referral for pharmacy case management and/or care coordination services.   Consent to Services:  The patient was given information about Chronic Care Management services, agreed to services, and gave verbal consent prior to initiation of services.  Please see initial visit note for detailed documentation.   Patient Care Team: Angela Pheasant, MD as PCP - General (Internal Medicine) De Hollingshead, RPH-CPP (Pharmacist)  Recent office visits: None since our last visit  Recent consult visits: None since our last visit  Hospital visits:  4/1-4/2 -  Hospitalization for acute metabolic encephalopathy w/ pneumonia, stopped metformin d/t lactic acidosis. Tx rocephin and zithromax, completed zithromax w/ omnicef   Objective:  Lab Results  Component Value Date   CREATININE 0.49 10/19/2020   CREATININE 0.50 10/19/2020   CREATININE 0.66 10/18/2020    Lab Results  Component Value Date   HGBA1C 7.2 (H) 06/12/2020   Last diabetic Eye exam:  Lab Results  Component Value Date/Time   HMDIABEYEEXA No Retinopathy 03/13/2019 12:00 AM    Last diabetic Foot exam:  Lab Results  Component Value Date/Time   HMDIABFOOTEX on my exam.  07/09/2014 12:00 AM        Component Value Date/Time   CHOL 175 06/12/2020 1332   TRIG 225.0 (H) 06/12/2020 1332   HDL 41.50 06/12/2020 1332   CHOLHDL 4 06/12/2020 1332   VLDL 45.0 (H) 06/12/2020 1332   LDLCALC 89 12/04/2019 0833   LDLDIRECT 107.0 06/12/2020 1332    Hepatic Function Latest Ref Rng & Units 10/19/2020 10/18/2020 08/14/2020  Total  Protein 6.5 - 8.1 g/dL 6.9 7.5 7.5  Albumin 3.5 - 5.0 g/dL 3.5 3.9 4.2  AST 15 - 41 U/L 23 25 19   ALT 0 - 44 U/L 17 18 15   Alk Phosphatase 38 - 126 U/L 54 60 66  Total Bilirubin 0.3 - 1.2 mg/dL 0.5 0.6 0.7  Bilirubin, Direct 0.0 - 0.3 mg/dL - - 0.1    Lab Results  Component Value Date/Time   TSH 2.47 06/12/2020 01:32 PM   TSH 3.04 04/17/2019 08:58 AM    CBC Latest Ref Rng & Units 10/19/2020 10/18/2020 07/25/2019  WBC 4.0 - 10.5 K/uL 11.2(H) 10.1 8.3  Hemoglobin 12.0 - 15.0 g/dL 13.1 13.7 14.4  Hematocrit 36.0 - 46.0 % 40.3 41.1 43.0  Platelets 150 - 400 K/uL 137(L) 163 164.0    Clinical ASCVD: Yes   Social History   Tobacco Use  Smoking Status Never Smoker  Smokeless Tobacco Never Used   BP Readings from Last 3 Encounters:  10/19/20 140/75  06/12/20 138/70  02/20/19 138/70   Pulse Readings from Last 3 Encounters:  10/19/20 73  06/12/20 74  02/20/19 74   Wt Readings from Last 3 Encounters:  10/23/20 102 lb (46.3 kg)  10/18/20 103 lb 9.9 oz (47 kg)  07/15/20 102 lb (46.3 kg)    Assessment: Review of patient past medical history, allergies, medications, health status, including review of consultants reports, laboratory and other test data, was performed as part of comprehensive evaluation and provision of chronic care management services.  SDOH:  (Social Determinants of Health) assessments and interventions performed:  SDOH Interventions   Flowsheet Row Most Recent Value  SDOH Interventions   Financial Strain Interventions Other (Comment)  [manufacturer assistance]      CCM Care Plan  Allergies  Allergen Reactions  . Actos [Pioglitazone] Swelling  . Contrast Media [Iodinated Diagnostic Agents] Swelling  . Prandin [Repaglinide] Swelling  . Pravastatin Sodium     cramps    Medications Reviewed Today    Reviewed by De Hollingshead, RPH-CPP (Pharmacist) on 11/19/20 at 1514  Med List Status: <None>  Medication Order Taking? Sig Documenting Provider Last  Dose Status Informant  amLODipine (NORVASC) 5 MG tablet [id number] Yes TAKE 1 TABLET BY MOUTH  TWICE DAILY Angela Pheasant, MD Taking Active Child  Cholecalciferol (VITAMIN D) 2000 units tablet [id number] Yes Take 2,000 Units by mouth daily.  [provider] Taking Active Child  clopidogrel (PLAVIX) 75 MG tablet [id number] Yes TAKE 1 TABLET(75 MG) BY MOUTH DAILY Angela Pheasant, MD Taking Active Child  empagliflozin (JARDIANCE) 10 MG TABS tablet [id number] Yes Take 1 tablet (10 mg total) by mouth daily before breakfast. Angela Pheasant, MD Taking Active   glucose blood test strip [id number] Yes USE TO CHECK BLOOD SUGAR  TWO TIMES DAILY. Dx E11.9 Angela Pheasant, MD Taking Active Child  glucose blood test strip [id number] Yes OneTouch Ultra Test strips [provider] Taking Active Child  Lancet Devices (ONE TOUCH DELICA LANCING DEV) [place] [id number] Yes Use twice daily Dx: 250.00 Angela Pheasant, MD Taking Active Child  OMEGA-3 FATTY ACIDS PO [id number] Yes Take 720 mg by mouth daily. [provider] Taking Active Child  rosuvastatin (CRESTOR) 5 MG tablet [id number] Yes TAKE 1 TABLET(5 MG) BY MOUTH 2 TIMES A WEEK  Patient taking differently: TAKE 1 TABLET(5 MG) BY MOUTH 2 TIMES A WEEK. On Saturday and Wednesday   Angela Pheasant, MD Taking Active Child  sertraline (ZOLOFT) 25 MG tablet [id number] Yes TAKE 1 TO 2 TABLETS BY MOUTH DAILY Angela Pheasant, MD Taking Active Child           Med Note De Hollingshead   Tue Nov 19, 2020  3:13 PM) 50 mg daily  timolol (BETIMOL) 0.5 % ophthalmic solution [id number] Yes 1 drop daily. [provider] Taking Active Child          Patient Active Problem List   Diagnosis Date Noted  . Weakness 10/27/2020  . Lobar pneumonia ([place])   . Lactic acidosis   . Acute metabolic encephalopathy   . CAP (community acquired pneumonia) 10/18/2020  . Cough 09/15/2020  . Finger lesion 06/13/2020  . Nasal lesion 06/13/2020  . Change in hearing  12/18/2019  . Finger laceration 02/21/2019  . Gait abnormality 11/13/2018  . Falls 08/28/2018  . Anxiety 06/22/2018  . Diarrhea 12/13/2017  . Rectal bleeding 07/07/2017  . Foot ulcer ([place]) 04/18/2017  . Open wound of foot excluding toes without complication 83/41/9622  . Health care maintenance 10/27/2014  . Weight loss 12/27/2013  . Thrombocytopenia ([place]) 11/20/2012  . Hypertension 07/21/2012  . Hypercholesterolemia 07/21/2012  . History of CVA (cerebrovascular accident) 07/21/2012  . Diabetes mellitus ([place]) 07/21/2012  . Essential hypertension 07/21/2012  . Pure hypercholesterolemia 07/21/2012  . Type 2 diabetes mellitus ([place]) 07/21/2012    Immunization History  Administered Date(s) Administered  . DTaP 03/21/2012  . Fluad Quad(high Dose 65+) 04/19/2019, 06/12/2020  . Influenza Split 03/21/2012, 04/03/2014  . Influenza, High Dose Seasonal PF 04/30/2017, 05/02/2018  . Influenza-Unspecified  04/09/2015, 04/14/2016, 04/28/2017  . PFIZER(Purple Top)SARS-COV-2 Vaccination 08/10/2019, 08/31/2019  . Pneumococcal Conjugate-13 07/09/2014  . Pneumococcal Polysaccharide-23 07/09/2012  . Tdap 02/16/2019    Conditions to be addressed/monitored: HLD and DMII  Care Plan : Medication Management  Updates made by De Hollingshead, RPH-CPP since 11/19/2020 12:00 AM    Problem: Diabetes, hx CVA     Long-Range Goal: Disease Progression Prevention   Start Date: 11/15/2020  This Visit's Progress: On track  Recent Progress: On track  Priority: High  Note:   Current Barriers:  . Unable to independently afford treatment regimen . Unable to achieve control of diabetes   Pharmacist Clinical Goal(s):  Marland Kitchen Over the next 90 days, patient will verbalize ability to afford treatment regimen . Over the next 90 days, patient will achieve control of diabetes as evidenced by A1c through collaboration with PharmD and provider.   Interventions: . 1:1 collaboration with Angela Pheasant, MD regarding  development and update of comprehensive plan of care as evidenced by provider attestation and co-signature . Inter-disciplinary care team collaboration (see longitudinal plan of care) . Comprehensive medication review performed; medication list updated in electronic medical record  Diabetes: . Uncontrolled; current treatment: Jardiance 10 mg daily o Hx metformin therapy. Discontinued at recent hospitalization due to lactic acidosis . Current glucose readings: 200-225s per daughter's memory, improved from 300s . Denies any concerns with symptoms of hypotension, issues with urinary frequency, no s/sx UTI or genitourinary infection. Does note that when she had a UTI in the past, patient did not have subjective symptoms besides change in urine color, smell, and change in cognition. Advised to monitor for those changes. Advised focus on adequate hydration . Discussed income. Patient meets criteria for Jardiance patient assistance. Will collaborate w/ CPhT to mail patient portion of application. Discussed proof of income need. Will collaborate w/ PCP for prescriber portion.  . Continue current regimen at this time  Hypertension: . Controlled; current treatment: amlodipine 5 mg daily . Home BP readings: checking since starting Jardiance, daughter believes last reading was ~140/78 . Recommend to continue current regimen at this time.  Hyperlipidemia/ secondary ASCVD prevention . Appropriately managed; current treatment: rosuvastatin 5 mg 2 days weekly . Antiplatelet therapy: clopidogrel 75 mg daily (hx CVA)  . Recommend to continue current regimen at this time.  Depression . Appropriately managed per last report to PCP; current treatment: sertraline 50 mg daily (2 25 mg tabs) . Dorie Rank that the script could be changed to a 50 mg tablet strength in the future.  . Recommend to continue current regimen at this time.  Supplements: Vitamin D, omega 3 fatty acids daily   Patient  Goals/Self-Care Activities . Over the next 90 days, patient will:  - take medications as prescribed check glucose daily, document, and provide at future appointments collaborate with provider on medication access solutions  Follow Up Plan: Telephone follow up appointment with care management team member scheduled for: ~6 weeks      Medication Assistance: Application for Jardiance   medication assistance program. in process.  Anticipated assistance start date TBD.  See plan of care for additional detail.  Patient's preferred pharmacy is:  [facility] #[id number] [address] Heal, [place] AT [facility] OF [address] [place] [address] Phone: [phone] Fax: [phone]  [place], [address], [address] [place], [place] [address] [place] [address] Phone: [phone]  Fax: [phone]    Follow Up:  Patient agrees to Care Plan and Follow-up.  Plan: Telephone follow up appointment with care management team member scheduled for:  ~ 5 weeks  Catie Darnelle Maffucci, PharmD, [place], [place] Clinical Pharmacist [employer] at [employer] [phone]

## 2020-11-20 DIAGNOSIS — Z8631 Personal history of diabetic foot ulcer: Secondary | ICD-10-CM | POA: Diagnosis not present

## 2020-11-20 DIAGNOSIS — Z7984 Long term (current) use of oral hypoglycemic drugs: Secondary | ICD-10-CM | POA: Diagnosis not present

## 2020-11-20 DIAGNOSIS — E1140 Type 2 diabetes mellitus with diabetic neuropathy, unspecified: Secondary | ICD-10-CM | POA: Diagnosis not present

## 2020-11-20 DIAGNOSIS — I10 Essential (primary) hypertension: Secondary | ICD-10-CM | POA: Diagnosis not present

## 2020-11-20 DIAGNOSIS — R531 Weakness: Secondary | ICD-10-CM | POA: Diagnosis not present

## 2020-11-20 DIAGNOSIS — F419 Anxiety disorder, unspecified: Secondary | ICD-10-CM | POA: Diagnosis not present

## 2020-11-22 DIAGNOSIS — I10 Essential (primary) hypertension: Secondary | ICD-10-CM | POA: Diagnosis not present

## 2020-11-22 DIAGNOSIS — E1140 Type 2 diabetes mellitus with diabetic neuropathy, unspecified: Secondary | ICD-10-CM | POA: Diagnosis not present

## 2020-11-22 DIAGNOSIS — F419 Anxiety disorder, unspecified: Secondary | ICD-10-CM | POA: Diagnosis not present

## 2020-11-22 DIAGNOSIS — R531 Weakness: Secondary | ICD-10-CM | POA: Diagnosis not present

## 2020-11-22 DIAGNOSIS — Z7984 Long term (current) use of oral hypoglycemic drugs: Secondary | ICD-10-CM | POA: Diagnosis not present

## 2020-11-22 DIAGNOSIS — Z8631 Personal history of diabetic foot ulcer: Secondary | ICD-10-CM | POA: Diagnosis not present

## 2020-11-26 ENCOUNTER — Telehealth: Payer: Self-pay | Admitting: Pharmacy Technician

## 2020-11-26 DIAGNOSIS — Z596 Low income: Secondary | ICD-10-CM

## 2020-11-26 NOTE — Progress Notes (Signed)
[facility])                                            [place] Team    11/26/2020  Angela Patterson 04/12/1921 [id number]                                       Medication Assistance Referral  Referral From: [place] RPh Catie T.   Medication/Company: [name] / BI Patient application portion:  Mailed Provider application portion:  N/A Embedded PharmD had signed in clinic to Dr. Einar Pheasant Provider address/fax verified via: Office website   Angela Patterson P. Angela Patterson, [place]  [phone]

## 2020-11-28 DIAGNOSIS — R531 Weakness: Secondary | ICD-10-CM | POA: Diagnosis not present

## 2020-11-28 DIAGNOSIS — Z7984 Long term (current) use of oral hypoglycemic drugs: Secondary | ICD-10-CM | POA: Diagnosis not present

## 2020-11-28 DIAGNOSIS — E1140 Type 2 diabetes mellitus with diabetic neuropathy, unspecified: Secondary | ICD-10-CM | POA: Diagnosis not present

## 2020-11-28 DIAGNOSIS — I10 Essential (primary) hypertension: Secondary | ICD-10-CM | POA: Diagnosis not present

## 2020-11-28 DIAGNOSIS — F419 Anxiety disorder, unspecified: Secondary | ICD-10-CM | POA: Diagnosis not present

## 2020-11-28 DIAGNOSIS — Z8631 Personal history of diabetic foot ulcer: Secondary | ICD-10-CM | POA: Diagnosis not present

## 2020-11-29 ENCOUNTER — Ambulatory Visit: Payer: Medicare Other | Admitting: Pharmacist

## 2020-11-29 DIAGNOSIS — I10 Essential (primary) hypertension: Secondary | ICD-10-CM

## 2020-11-29 DIAGNOSIS — Z8673 Personal history of transient ischemic attack (TIA), and cerebral infarction without residual deficits: Secondary | ICD-10-CM

## 2020-11-29 DIAGNOSIS — E1140 Type 2 diabetes mellitus with diabetic neuropathy, unspecified: Secondary | ICD-10-CM

## 2020-11-29 DIAGNOSIS — E7800 Pure hypercholesterolemia, unspecified: Secondary | ICD-10-CM

## 2020-11-29 NOTE — Chronic Care Management (AMB) (Signed)
Chronic Care Management Pharmacy Note  11/29/2020 Name:  Angela Patterson MRN:  [id number] DOB:  1920-09-05  Subjective: Angela Patterson is an 87 y.o. year old female who is a primary patient of Einar Pheasant, MD.  The CCM team was consulted for assistance with disease management and care coordination needs.    Engaged with patient's daughter by telephone for medication access concern in response to provider referral for pharmacy case management and/or care coordination services.   Consent to Services:  The patient was given information about Chronic Care Management services, agreed to services, and gave verbal consent prior to initiation of services.  Please see initial visit note for detailed documentation.   Patient Care Team: Einar Pheasant, MD as PCP - General (Internal Medicine) De Hollingshead, RPH-CPP (Pharmacist)   Objective:  Lab Results  Component Value Date   CREATININE 0.49 10/19/2020   CREATININE 0.50 10/19/2020   CREATININE 0.66 10/18/2020    Lab Results  Component Value Date   HGBA1C 7.2 (H) 06/12/2020   Last diabetic Eye exam:  Lab Results  Component Value Date/Time   HMDIABEYEEXA No Retinopathy 03/13/2019 12:00 AM    Last diabetic Foot exam:  Lab Results  Component Value Date/Time   HMDIABFOOTEX on my exam.  07/09/2014 12:00 AM        Component Value Date/Time   CHOL 175 06/12/2020 1332   TRIG 225.0 (H) 06/12/2020 1332   HDL 41.50 06/12/2020 1332   CHOLHDL 4 06/12/2020 1332   VLDL 45.0 (H) 06/12/2020 1332   LDLCALC 89 12/04/2019 0833   LDLDIRECT 107.0 06/12/2020 1332    Hepatic Function Latest Ref Rng & Units 10/19/2020 10/18/2020 08/14/2020  Total Protein 6.5 - 8.1 g/dL 6.9 7.5 7.5  Albumin 3.5 - 5.0 g/dL 3.5 3.9 4.2  AST 15 - 41 U/L 23 25 19   ALT 0 - 44 U/L 17 18 15   Alk Phosphatase 38 - 126 U/L 54 60 66  Total Bilirubin 0.3 - 1.2 mg/dL 0.5 0.6 0.7  Bilirubin, Direct 0.0 - 0.3 mg/dL - - 0.1    Lab Results  Component Value Date/Time    TSH 2.47 06/12/2020 01:32 PM   TSH 3.04 04/17/2019 08:58 AM    CBC Latest Ref Rng & Units 10/19/2020 10/18/2020 07/25/2019  WBC 4.0 - 10.5 K/uL 11.2(H) 10.1 8.3  Hemoglobin 12.0 - 15.0 g/dL 13.1 13.7 14.4  Hematocrit 36.0 - 46.0 % 40.3 41.1 43.0  Platelets 150 - 400 K/uL 137(L) 163 164.0     Clinical ASCVD: Yes  The ASCVD Risk score Mikey Bussing DC Jr., et al., 2013) failed to calculate for the following reasons:   The 2013 ASCVD risk score is only valid for ages 20 to 71   The patient has a prior MI or stroke diagnosis     Social History   Tobacco Use  Smoking Status Never Smoker  Smokeless Tobacco Never Used   BP Readings from Last 3 Encounters:  10/19/20 140/75  06/12/20 138/70  02/20/19 138/70   Pulse Readings from Last 3 Encounters:  10/19/20 73  06/12/20 74  02/20/19 74   Wt Readings from Last 3 Encounters:  10/23/20 102 lb (46.3 kg)  10/18/20 103 lb 9.9 oz (47 kg)  07/15/20 102 lb (46.3 kg)    Assessment: Review of patient past medical history, allergies, medications, health status, including review of consultants reports, laboratory and other test data, was performed as part of comprehensive evaluation and provision of chronic care management services.  SDOH:  (Social Determinants of Health) assessments and interventions performed:  SDOH Interventions   Flowsheet Row Most Recent Value  SDOH Interventions   Financial Strain Interventions Other (Comment)  [manufacturer assistance]      CCM Care Plan  Allergies  Allergen Reactions  . Actos [Pioglitazone] Swelling  . Contrast Media [Iodinated Diagnostic Agents] Swelling  . Prandin [Repaglinide] Swelling  . Pravastatin Sodium     cramps    Medications Reviewed Today    Reviewed by De Hollingshead, RPH-CPP (Pharmacist) on 11/19/20 at 1514  Med List Status: <None>  Medication Order Taking? Sig Documenting Provider Last Dose Status Informant  amLODipine (NORVASC) 5 MG tablet [id number] Yes TAKE 1 TABLET BY  MOUTH  TWICE DAILY Einar Pheasant, MD Taking Active Child  Cholecalciferol (VITAMIN D) 2000 units tablet [id number] Yes Take 2,000 Units by mouth daily.  [provider] Taking Active Child  clopidogrel (PLAVIX) 75 MG tablet [id number] Yes TAKE 1 TABLET(75 MG) BY MOUTH DAILY Einar Pheasant, MD Taking Active Child  empagliflozin (JARDIANCE) 10 MG TABS tablet [id number] Yes Take 1 tablet (10 mg total) by mouth daily before breakfast. Einar Pheasant, MD Taking Active   glucose blood test strip [id number] Yes USE TO CHECK BLOOD SUGAR  TWO TIMES DAILY. Dx E11.9 Einar Pheasant, MD Taking Active Child  glucose blood test strip [id number] Yes OneTouch Ultra Test strips [provider] Taking Active Child  Lancet Devices (ONE TOUCH DELICA LANCING DEV) [place] [id number] Yes Use twice daily Dx: 250.00 Einar Pheasant, MD Taking Active Child  OMEGA-3 FATTY ACIDS PO [id number] Yes Take 720 mg by mouth daily. [provider] Taking Active Child  rosuvastatin (CRESTOR) 5 MG tablet [id number] Yes TAKE 1 TABLET(5 MG) BY MOUTH 2 TIMES A WEEK  Patient taking differently: TAKE 1 TABLET(5 MG) BY MOUTH 2 TIMES A WEEK. On Saturday and Wednesday   Einar Pheasant, MD Taking Active Child  sertraline (ZOLOFT) 25 MG tablet [id number] Yes TAKE 1 TO 2 TABLETS BY MOUTH DAILY Einar Pheasant, MD Taking Active Child           Med Note De Hollingshead   Tue Nov 19, 2020  3:13 PM) 50 mg daily  timolol (BETIMOL) 0.5 % ophthalmic solution [id number] Yes 1 drop daily. [provider] Taking Active Child          Patient Active Problem List   Diagnosis Date Noted  . Weakness 10/27/2020  . Lobar pneumonia ([place])   . Lactic acidosis   . Acute metabolic encephalopathy   . CAP (community acquired pneumonia) 10/18/2020  . Cough 09/15/2020  . Finger lesion 06/13/2020  . Nasal lesion 06/13/2020  . Change in hearing 12/18/2019  . Finger laceration 02/21/2019  . Gait abnormality 11/13/2018  . Falls  08/28/2018  . Anxiety 06/22/2018  . Diarrhea 12/13/2017  . Rectal bleeding 07/07/2017  . Foot ulcer ([place]) 04/18/2017  . Open wound of foot excluding toes without complication 17/79/3903  . Health care maintenance 10/27/2014  . Weight loss 12/27/2013  . Thrombocytopenia ([place]) 11/20/2012  . Hypertension 07/21/2012  . Hypercholesterolemia 07/21/2012  . History of CVA (cerebrovascular accident) 07/21/2012  . Diabetes mellitus ([place]) 07/21/2012  . Essential hypertension 07/21/2012  . Pure hypercholesterolemia 07/21/2012  . Type 2 diabetes mellitus ([place]) 07/21/2012    Immunization History  Administered Date(s) Administered  . DTaP 03/21/2012  . Fluad Quad(high Dose 65+) 04/19/2019, 06/12/2020  . Influenza Split 03/21/2012, 04/03/2014  . Influenza, High Dose Seasonal PF 04/30/2017, 05/02/2018  . Influenza-Unspecified  04/09/2015, 04/14/2016, 04/28/2017  . PFIZER(Purple Top)SARS-COV-2 Vaccination 08/10/2019, 08/31/2019  . Pneumococcal Conjugate-13 07/09/2014  . Pneumococcal Polysaccharide-23 07/09/2012  . Tdap 02/16/2019    Conditions to be addressed/monitored: DMII and hx CVA  Care Plan : Medication Management  Updates made by De Hollingshead, RPH-CPP since 11/29/2020 12:00 AM    Problem: Diabetes, hx CVA     Long-Range Goal: Disease Progression Prevention   Start Date: 11/15/2020  This Visit's Progress: On track  Recent Progress: On track  Priority: High  Note:   Current Barriers:  . Unable to independently afford treatment regimen . Unable to achieve control of diabetes   Pharmacist Clinical Goal(s):  Marland Kitchen Over the next 90 days, patient will verbalize ability to afford treatment regimen . Over the next 90 days, patient will achieve control of diabetes as evidenced by A1c through collaboration with PharmD and provider.   Interventions: . 1:1 collaboration with Einar Pheasant, MD regarding development and update of comprehensive plan of care as evidenced by provider  attestation and co-signature . Inter-disciplinary care team collaboration (see longitudinal plan of care) . Comprehensive medication review performed; medication list updated in electronic medical record  Diabetes: . Uncontrolled; current treatment: Jardiance 10 mg daily o Hx metformin therapy. Discontinued at recent hospitalization due to lactic acidosis . Current glucose readings: 200-225s per daughter's memory, improved from 300s . Patient's daughter notes that they have not received application yet. Reviewed CPhT documentation. Mailed 5/10. Will prepare samples to tide patient over until patient assistance arrives.   Hypertension: . Controlled; current treatment: amlodipine 5 mg daily . Previously recommended to continue current regimen at this time.  Hyperlipidemia/ secondary ASCVD prevention . Appropriately managed; current treatment: rosuvastatin 5 mg 2 days weekly . Antiplatelet therapy: clopidogrel 75 mg daily (hx CVA)  . Previously recommended to continue current regimen at this time.  Depression . Appropriately managed per last report to PCP; current treatment: sertraline 50 mg daily (2 25 mg tabs) . Previously advised [name] that the script could be changed to a 50 mg tablet strength in the future.  . Previously recommended to continue current regimen at this time.  Supplements: Vitamin D, omega 3 fatty acids daily   Patient Goals/Self-Care Activities . Over the next 90 days, patient will:  - take medications as prescribed check glucose daily, document, and provide at future appointments collaborate with provider on medication access solutions  Follow Up Plan: Telephone follow up appointment with care management team member scheduled for: ~4 weeks as previously scheduled       Medication Assistance: Application for Jardiance  medication assistance program. in process.  Anticipated assistance start date TBD.  See plan of care for additional detail.  Patient's  preferred pharmacy is:  [facility] #[id number] [address], [place] AT [address] [place] [address] Phone: [phone] Fax: [phone]  [place], [address], [address] [place], [place] [address] [place] [address] Phone: [phone] Fax: [phone]   Follow Up:  Patient agrees to Care Plan and Follow-up.  Plan: Telephone follow up appointment with care management team member scheduled for:  ~ 4 weeks  Catie Darnelle Maffucci, PharmD, [place], [place] Clinical Pharmacist [employer] at [employer] [phone]

## 2020-11-29 NOTE — Patient Instructions (Signed)
Visit Information  PATIENT GOALS: Goals Addressed              This Visit's Progress     Patient Stated   .  Medication Monitoring (pt-stated)        Patient Goals/Self-Care Activities . Over the next 90 days, patient will:  - take medications as prescribed check glucose daily, document, and provide at future appointments collaborate with provider on medication access solutions       Patient verbalizes understanding of instructions provided today and agrees to view in [place].    Plan: Telephone follow up appointment with care management team member scheduled for:  ~ 4 weeks  Catie Darnelle Maffucci, PharmD, [place], [place] Clinical Pharmacist [employer] at [employer] [phone]

## 2020-12-04 ENCOUNTER — Encounter: Payer: Self-pay | Admitting: Internal Medicine

## 2020-12-10 ENCOUNTER — Other Ambulatory Visit: Payer: Medicare Other

## 2020-12-17 ENCOUNTER — Other Ambulatory Visit: Payer: Medicare Other

## 2020-12-20 ENCOUNTER — Ambulatory Visit (INDEPENDENT_AMBULATORY_CARE_PROVIDER_SITE_OTHER): Payer: Medicare Other

## 2020-12-20 ENCOUNTER — Other Ambulatory Visit (INDEPENDENT_AMBULATORY_CARE_PROVIDER_SITE_OTHER): Payer: Medicare Other

## 2020-12-20 ENCOUNTER — Other Ambulatory Visit: Payer: Self-pay

## 2020-12-20 DIAGNOSIS — I10 Essential (primary) hypertension: Secondary | ICD-10-CM | POA: Diagnosis not present

## 2020-12-20 DIAGNOSIS — E7800 Pure hypercholesterolemia, unspecified: Secondary | ICD-10-CM | POA: Diagnosis not present

## 2020-12-20 DIAGNOSIS — I517 Cardiomegaly: Secondary | ICD-10-CM | POA: Diagnosis not present

## 2020-12-20 DIAGNOSIS — J189 Pneumonia, unspecified organism: Secondary | ICD-10-CM | POA: Diagnosis not present

## 2020-12-20 DIAGNOSIS — E1140 Type 2 diabetes mellitus with diabetic neuropathy, unspecified: Secondary | ICD-10-CM

## 2020-12-20 LAB — CBC WITH DIFFERENTIAL/PLATELET
Basophils Absolute: 0.1 10*3/uL (ref 0.0–0.1)
Basophils Relative: 0.8 % (ref 0.0–3.0)
Eosinophils Absolute: 0.2 10*3/uL (ref 0.0–0.7)
Eosinophils Relative: 2.2 % (ref 0.0–5.0)
HCT: 45.8 % (ref 36.0–46.0)
Hemoglobin: 15.4 g/dL — ABNORMAL HIGH (ref 12.0–15.0)
Lymphocytes Relative: 24.8 % (ref 12.0–46.0)
Lymphs Abs: 1.7 10*3/uL (ref 0.7–4.0)
MCHC: 33.6 g/dL (ref 30.0–36.0)
MCV: 92.6 fl (ref 78.0–100.0)
Monocytes Absolute: 0.6 10*3/uL (ref 0.1–1.0)
Monocytes Relative: 8.2 % (ref 3.0–12.0)
Neutro Abs: 4.5 10*3/uL (ref 1.4–7.7)
Neutrophils Relative %: 64 % (ref 43.0–77.0)
Platelets: 116 10*3/uL — ABNORMAL LOW (ref 150.0–400.0)
RBC: 4.95 Mil/uL (ref 3.87–5.11)
RDW: 13.5 % (ref 11.5–15.5)
WBC: 7 10*3/uL (ref 4.0–10.5)

## 2020-12-20 LAB — HEMOGLOBIN A1C: Hgb A1c MFr Bld: 8.7 % — ABNORMAL HIGH (ref 4.6–6.5)

## 2020-12-23 LAB — BASIC METABOLIC PANEL
BUN: 22 mg/dL (ref 6–23)
CO2: 26 mEq/L (ref 19–32)
Calcium: 9.5 mg/dL (ref 8.4–10.5)
Chloride: 102 mEq/L (ref 96–112)
Creatinine, Ser: 0.76 mg/dL (ref 0.40–1.20)
GFR: 65.5 mL/min (ref >60.00)
Glucose, Bld: 180 mg/dL — ABNORMAL HIGH (ref 70–99)
Potassium: 4.1 mEq/L (ref 3.5–5.1)
Sodium: 139 mEq/L (ref 135–145)

## 2020-12-23 LAB — LIPID PANEL
Cholesterol: 138 mg/dL (ref 0–200)
HDL: 52.2 mg/dL (ref >39.00)
LDL Cholesterol: 60 mg/dL (ref 0–99)
NonHDL: 85.95
Total CHOL/HDL Ratio: 3
Triglycerides: 128 mg/dL (ref 0.0–149.0)
VLDL: 25.6 mg/dL (ref 0.0–40.0)

## 2020-12-23 LAB — HEPATIC FUNCTION PANEL
ALT: 51 U/L — ABNORMAL HIGH (ref 0–35)
AST: 55 U/L — ABNORMAL HIGH (ref 0–37)
Albumin: 4.2 g/dL (ref 3.5–5.2)
Alkaline Phosphatase: 77 U/L (ref 39–117)
Bilirubin, Direct: 0.1 mg/dL (ref 0.0–0.3)
Total Bilirubin: 0.7 mg/dL (ref 0.2–1.2)
Total Protein: 7.6 g/dL (ref 6.0–8.3)

## 2020-12-23 LAB — MICROALBUMIN / CREATININE URINE RATIO
Creatinine,U: 52.1 mg/dL
Microalb Creat Ratio: 3 mg/g (ref 0.0–30.0)
Microalb, Ur: 1.6 mg/dL (ref 0.0–1.9)

## 2020-12-24 ENCOUNTER — Telehealth: Payer: Self-pay | Admitting: Pharmacy Technician

## 2020-12-24 DIAGNOSIS — Z596 Low income: Secondary | ICD-10-CM

## 2020-12-24 NOTE — Progress Notes (Signed)
[name])                                            [place] Team    12/24/2020  Angela Patterson 06-23-1921 [id number]  Received both patient and provider portion(s) of patient assistance application(s) for Jardiance. Faxed completed application and required documents into BI.    [name] [name], [place]  [phone]

## 2020-12-25 ENCOUNTER — Other Ambulatory Visit: Payer: Self-pay | Admitting: Internal Medicine

## 2020-12-25 DIAGNOSIS — D696 Thrombocytopenia, unspecified: Secondary | ICD-10-CM

## 2020-12-25 DIAGNOSIS — R7989 Other specified abnormal findings of blood chemistry: Secondary | ICD-10-CM

## 2020-12-25 NOTE — Progress Notes (Signed)
Orders placed for f/u labs.  

## 2020-12-26 ENCOUNTER — Telehealth: Payer: Self-pay

## 2020-12-26 NOTE — Telephone Encounter (Signed)
LM regarding labs °

## 2021-01-01 ENCOUNTER — Telehealth: Payer: Self-pay | Admitting: Pharmacy Technician

## 2021-01-01 DIAGNOSIS — Z596 Low income: Secondary | ICD-10-CM

## 2021-01-01 NOTE — Progress Notes (Signed)
[facility])                                            [place] Team    01/01/2021  Angela Patterson 10/15/1920 [id number]  Care coordination call placed to BI in regards to patient's application for Jardiance.  Spoke to [place] who informed patient was APPROVED 12/24/20-07/19/21. She informed a 3 months supply shipped ou on 12/26/20 and was delivered to patient's address on application on 0/41/36.  Gee Habig P. Pavan Bring, [place]  [phone]

## 2021-01-03 DIAGNOSIS — I4581 Long QT syndrome: Secondary | ICD-10-CM | POA: Diagnosis not present

## 2021-01-03 DIAGNOSIS — R7401 Elevation of levels of liver transaminase levels: Secondary | ICD-10-CM | POA: Diagnosis not present

## 2021-01-03 DIAGNOSIS — R000 Tachycardia, unspecified: Secondary | ICD-10-CM | POA: Diagnosis not present

## 2021-01-03 DIAGNOSIS — I10 Essential (primary) hypertension: Secondary | ICD-10-CM | POA: Diagnosis not present

## 2021-01-03 DIAGNOSIS — M542 Cervicalgia: Secondary | ICD-10-CM | POA: Diagnosis not present

## 2021-01-03 DIAGNOSIS — M19011 Primary osteoarthritis, right shoulder: Secondary | ICD-10-CM | POA: Diagnosis not present

## 2021-01-03 DIAGNOSIS — S0001XA Abrasion of scalp, initial encounter: Secondary | ICD-10-CM | POA: Diagnosis not present

## 2021-01-03 DIAGNOSIS — F0390 Unspecified dementia without behavioral disturbance: Secondary | ICD-10-CM | POA: Diagnosis not present

## 2021-01-03 DIAGNOSIS — S20411A Abrasion of right back wall of thorax, initial encounter: Secondary | ICD-10-CM | POA: Diagnosis not present

## 2021-01-03 DIAGNOSIS — I452 Bifascicular block: Secondary | ICD-10-CM | POA: Diagnosis not present

## 2021-01-03 DIAGNOSIS — S0003XA Contusion of scalp, initial encounter: Secondary | ICD-10-CM | POA: Diagnosis not present

## 2021-01-03 DIAGNOSIS — S22068A Other fracture of T7-T8 thoracic vertebra, initial encounter for closed fracture: Secondary | ICD-10-CM | POA: Diagnosis not present

## 2021-01-03 DIAGNOSIS — S299XXA Unspecified injury of thorax, initial encounter: Secondary | ICD-10-CM | POA: Diagnosis not present

## 2021-01-03 DIAGNOSIS — S199XXA Unspecified injury of neck, initial encounter: Secondary | ICD-10-CM | POA: Diagnosis not present

## 2021-01-03 DIAGNOSIS — E1165 Type 2 diabetes mellitus with hyperglycemia: Secondary | ICD-10-CM | POA: Diagnosis not present

## 2021-01-03 DIAGNOSIS — M160 Bilateral primary osteoarthritis of hip: Secondary | ICD-10-CM | POA: Diagnosis not present

## 2021-01-03 DIAGNOSIS — R0902 Hypoxemia: Secondary | ICD-10-CM | POA: Diagnosis not present

## 2021-01-03 DIAGNOSIS — S59901A Unspecified injury of right elbow, initial encounter: Secondary | ICD-10-CM | POA: Diagnosis not present

## 2021-01-03 DIAGNOSIS — S59911A Unspecified injury of right forearm, initial encounter: Secondary | ICD-10-CM | POA: Diagnosis not present

## 2021-01-03 DIAGNOSIS — W109XXA Fall (on) (from) unspecified stairs and steps, initial encounter: Secondary | ICD-10-CM | POA: Diagnosis not present

## 2021-01-03 DIAGNOSIS — I4891 Unspecified atrial fibrillation: Secondary | ICD-10-CM | POA: Diagnosis not present

## 2021-01-03 DIAGNOSIS — S0990XA Unspecified injury of head, initial encounter: Secondary | ICD-10-CM | POA: Diagnosis not present

## 2021-01-03 DIAGNOSIS — M47816 Spondylosis without myelopathy or radiculopathy, lumbar region: Secondary | ICD-10-CM | POA: Diagnosis not present

## 2021-01-03 DIAGNOSIS — Z7902 Long term (current) use of antithrombotics/antiplatelets: Secondary | ICD-10-CM | POA: Diagnosis not present

## 2021-01-03 DIAGNOSIS — S4991XA Unspecified injury of right shoulder and upper arm, initial encounter: Secondary | ICD-10-CM | POA: Diagnosis not present

## 2021-01-03 DIAGNOSIS — M545 Low back pain, unspecified: Secondary | ICD-10-CM | POA: Diagnosis not present

## 2021-01-03 DIAGNOSIS — S0181XA Laceration without foreign body of other part of head, initial encounter: Secondary | ICD-10-CM | POA: Diagnosis not present

## 2021-01-03 DIAGNOSIS — S3991XA Unspecified injury of abdomen, initial encounter: Secondary | ICD-10-CM | POA: Diagnosis not present

## 2021-01-03 DIAGNOSIS — I445 Left posterior fascicular block: Secondary | ICD-10-CM | POA: Diagnosis not present

## 2021-01-03 DIAGNOSIS — S30810A Abrasion of lower back and pelvis, initial encounter: Secondary | ICD-10-CM | POA: Diagnosis not present

## 2021-01-03 DIAGNOSIS — Z20822 Contact with and (suspected) exposure to covid-19: Secondary | ICD-10-CM | POA: Diagnosis not present

## 2021-01-03 DIAGNOSIS — S51011A Laceration without foreign body of right elbow, initial encounter: Secondary | ICD-10-CM | POA: Diagnosis not present

## 2021-01-03 DIAGNOSIS — W19XXXA Unspecified fall, initial encounter: Secondary | ICD-10-CM | POA: Diagnosis not present

## 2021-01-03 DIAGNOSIS — I4510 Unspecified right bundle-branch block: Secondary | ICD-10-CM | POA: Diagnosis not present

## 2021-01-03 DIAGNOSIS — S70211A Abrasion, right hip, initial encounter: Secondary | ICD-10-CM | POA: Diagnosis not present

## 2021-01-06 ENCOUNTER — Encounter: Payer: Self-pay | Admitting: Internal Medicine

## 2021-01-06 NOTE — Telephone Encounter (Signed)
Called and spoke with Angela Patterson. Angela Patterson states that Angela Patterson was outside and hit her head on a flower pot. This caused a lot of bleeding and they were taken straight to [facility] for a head injury. She received labs, Ct scan, and Xray and everything came back normal. Angela Patterson states that Angela Patterson is doing a lot better and has no complaints. She has a lab appointment on 01/07/21 and asks if there is anything new that should be added on to her labs that was not done at the hospital.

## 2021-01-06 NOTE — Telephone Encounter (Signed)
Please call and notify Angela Patterson or Angela Patterson that she had all of the labs drawn that we had ordered, so she does not need to come in for labs.  If they feel she needs to be seen, we can work her in.

## 2021-01-06 NOTE — Telephone Encounter (Signed)
Please call [name] and confirm Ms Angus is doing ok.  Let me know if needs anything.

## 2021-01-07 ENCOUNTER — Other Ambulatory Visit: Payer: Medicare Other

## 2021-01-07 NOTE — Telephone Encounter (Signed)
Angela Patterson is aware and appointment has been cancelled.

## 2021-01-17 ENCOUNTER — Other Ambulatory Visit: Payer: Self-pay

## 2021-01-17 ENCOUNTER — Telehealth: Payer: Self-pay

## 2021-01-17 MED ORDER — GLUCOSE BLOOD VI STRP: ORAL_STRIP | 1 refills | Status: DC

## 2021-01-17 NOTE — Telephone Encounter (Signed)
Pts daughter [name] called in saying she spoke with provider about her mom and palliative care and ask if the provider can get that started for pt as soon as she can.

## 2021-01-18 ENCOUNTER — Other Ambulatory Visit: Payer: Self-pay | Admitting: Internal Medicine

## 2021-01-18 ENCOUNTER — Encounter: Payer: Self-pay | Admitting: Internal Medicine

## 2021-01-18 DIAGNOSIS — I10 Essential (primary) hypertension: Secondary | ICD-10-CM

## 2021-01-18 DIAGNOSIS — W19XXXD Unspecified fall, subsequent encounter: Secondary | ICD-10-CM

## 2021-01-18 DIAGNOSIS — R531 Weakness: Secondary | ICD-10-CM

## 2021-01-18 DIAGNOSIS — E1140 Type 2 diabetes mellitus with diabetic neuropathy, unspecified: Secondary | ICD-10-CM

## 2021-01-18 NOTE — Telephone Encounter (Signed)
I have placed order for referral to palliative care.  Let me know if I need to do anything more.  Thank you.

## 2021-01-18 NOTE — Progress Notes (Signed)
Order placed for palliative care referral.  

## 2021-01-22 ENCOUNTER — Ambulatory Visit (INDEPENDENT_AMBULATORY_CARE_PROVIDER_SITE_OTHER): Payer: Medicare Other | Admitting: Pharmacist

## 2021-01-22 DIAGNOSIS — I10 Essential (primary) hypertension: Secondary | ICD-10-CM

## 2021-01-22 DIAGNOSIS — E7800 Pure hypercholesterolemia, unspecified: Secondary | ICD-10-CM | POA: Diagnosis not present

## 2021-01-22 DIAGNOSIS — E1140 Type 2 diabetes mellitus with diabetic neuropathy, unspecified: Secondary | ICD-10-CM

## 2021-01-22 DIAGNOSIS — Z8673 Personal history of transient ischemic attack (TIA), and cerebral infarction without residual deficits: Secondary | ICD-10-CM

## 2021-01-22 MED ORDER — EMPAGLIFLOZIN 25 MG PO TABS: 25.0000 mg | ORAL_TABLET | Freq: Every day | ORAL | 3 refills | Status: DC

## 2021-01-22 NOTE — Patient Instructions (Signed)
Visit Information  PATIENT GOALS:  Goals Addressed               This Visit's Progress     Patient Stated     Medication Monitoring (pt-stated)        Patient Goals/Self-Care Activities Over the next 90 days, patient will:  - take medications as prescribed check glucose 2-3 times weekly, document, and provide at future appointments collaborate with provider on medication access solutions         Patient verbalizes understanding of instructions provided today and agrees to view in [place].  Plan: Telephone follow up appointment with care management team member scheduled for:  ~ 12 weeks  Catie Darnelle Maffucci, PharmD, [place], [place] Clinical Pharmacist [employer] at [employer] [phone]

## 2021-01-22 NOTE — Chronic Care Management (AMB) (Signed)
Chronic Care Management Pharmacy Note  01/22/2021 Name:  Angela Patterson MRN:  [id number] DOB:  1921/02/22   Subjective: Angela Patterson is an 87 y.o. year old female who is a primary patient of Einar Pheasant, MD.  The CCM team was consulted for assistance with disease management and care coordination needs.    Engaged with patient's daughter, [name] (on [place]) by telephone for follow up visit in response to provider referral for pharmacy case management and/or care coordination services.   Consent to Services:  The patient was given information about Chronic Care Management services, agreed to services, and gave verbal consent prior to initiation of services.  Please see initial visit note for detailed documentation.   Patient Care Team: Einar Pheasant, MD as PCP - General (Internal Medicine) De Hollingshead, RPH-CPP (Pharmacist)  Recent office visits: 6/3 - A1c 8.7%, increased Jardiance to 20 mg (2 10 mg tabs QAM)  Hospital visits: 6/17 - ED for fall; eGFR 67- Palliative Care referral   Objective:  Lab Results  Component Value Date   CREATININE 0.76 12/20/2020   CREATININE 0.49 10/19/2020   CREATININE 0.50 10/19/2020    Lab Results  Component Value Date   HGBA1C 8.7 (H) 12/20/2020   Last diabetic Eye exam:  Lab Results  Component Value Date/Time   HMDIABEYEEXA No Retinopathy 03/13/2019 12:00 AM    Last diabetic Foot exam:  Lab Results  Component Value Date/Time   HMDIABFOOTEX on my exam.  07/09/2014 12:00 AM        Component Value Date/Time   CHOL 138 12/20/2020 0818   TRIG 128.0 12/20/2020 0818   HDL 52.20 12/20/2020 0818   CHOLHDL 3 12/20/2020 0818   VLDL 25.6 12/20/2020 0818   LDLCALC 60 12/20/2020 0818   LDLDIRECT 107.0 06/12/2020 1332    Hepatic Function Latest Ref Rng & Units 12/20/2020 10/19/2020 10/18/2020  Total Protein 6.0 - 8.3 g/dL 7.6 6.9 7.5  Albumin 3.5 - 5.2 g/dL 4.2 3.5 3.9  AST 0 - 37 U/L 55(H) 23 25  ALT 0 - 35 U/L 51(H) 17 18  Alk  Phosphatase 39 - 117 U/L 77 54 60  Total Bilirubin 0.2 - 1.2 mg/dL 0.7 0.5 0.6  Bilirubin, Direct 0.0 - 0.3 mg/dL 0.1 - -    Lab Results  Component Value Date/Time   TSH 2.47 06/12/2020 01:32 PM   TSH 3.04 04/17/2019 08:58 AM    CBC Latest Ref Rng & Units 12/20/2020 10/19/2020 10/18/2020  WBC 4.0 - 10.5 K/uL 7.0 11.2(H) 10.1  Hemoglobin 12.0 - 15.0 g/dL 15.4(H) 13.1 13.7  Hematocrit 36.0 - 46.0 % 45.8 40.3 41.1  Platelets 150.0 - 400.0 K/uL 116.0(L) 137(L) 163      Clinical ASCVD: Yes  The ASCVD Risk score Mikey Bussing DC Jr., et al., 2013) failed to calculate for the following reasons:   The 2013 ASCVD risk score is only valid for ages 77 to 24   The patient has a prior MI or stroke diagnosis      Social History   Tobacco Use  Smoking Status Never  Smokeless Tobacco Never   BP Readings from Last 3 Encounters:  10/19/20 140/75  06/12/20 138/70  02/20/19 138/70   Pulse Readings from Last 3 Encounters:  10/19/20 73  06/12/20 74  02/20/19 74   Wt Readings from Last 3 Encounters:  10/23/20 102 lb (46.3 kg)  10/18/20 103 lb 9.9 oz (47 kg)  07/15/20 102 lb (46.3 kg)    Assessment: Review  of patient past medical history, allergies, medications, health status, including review of consultants reports, laboratory and other test data, was performed as part of comprehensive evaluation and provision of chronic care management services.   SDOH:  (Social Determinants of Health) assessments and interventions performed:  SDOH Interventions    Flowsheet Row Most Recent Value  SDOH Interventions   Financial Strain Interventions Other (Comment)  [manufacturer assistance]       CCM Care Plan  Allergies  Allergen Reactions   Actos [Pioglitazone] Swelling   Contrast Media [Iodinated Diagnostic Agents] Swelling   Prandin [Repaglinide] Swelling   Pravastatin Sodium     cramps    Medications Reviewed Today     Reviewed by De Hollingshead, RPH-CPP (Pharmacist) on 01/22/21 at  1054  Med List Status: <None>   Medication Order Taking? Sig Documenting Provider Last Dose Status Informant  amLODipine (NORVASC) 5 MG tablet [id number] Yes TAKE 1 TABLET BY MOUTH  TWICE DAILY Einar Pheasant, MD Taking Active Child  Cholecalciferol (VITAMIN D) 2000 units tablet [id number] Yes Take 2,000 Units by mouth daily.  [provider] Taking Active Child  clopidogrel (PLAVIX) 75 MG tablet [id number] Yes TAKE 1 TABLET(75 MG) BY MOUTH DAILY Einar Pheasant, MD Taking Active Child  empagliflozin (JARDIANCE) 10 MG TABS tablet [id number] Yes Take 1 tablet (10 mg total) by mouth daily before breakfast. Einar Pheasant, MD Taking Active            Med Note Darnelle Maffucci, Arville Lime   Wed Jan 22, 2021 10:49 AM) Taking 20 mg daily  glucose blood test strip [id number]  OneTouch Ultra Test strips [provider]  Active Child  glucose blood test strip [id number]  USE TO CHECK BLOOD SUGAR  TWO TIMES DAILY. Dx E11.9 Einar Pheasant, MD  Active   Lancet Devices (ONE [facility] DELICA LANCING DEV) MISC [id number]  Use twice daily Dx: 250.00 Einar Pheasant, MD  Active Child  Multiple Vitamin (MULTIVITAMIN) tablet [id number] Yes Take 1 tablet by mouth daily. [provider] Taking Active   OMEGA-3 FATTY ACIDS PO [id number] Yes Take 720 mg by mouth daily. [provider] Taking Active Child  rosuvastatin (CRESTOR) 5 MG tablet [id number] Yes TAKE 1 TABLET(5 MG) BY MOUTH 2 TIMES A WEEK  Patient taking differently: TAKE 1 TABLET(5 MG) BY MOUTH 2 TIMES A WEEK. On Saturday and Wednesday   Einar Pheasant, MD Taking Active Child  sertraline (ZOLOFT) 25 MG tablet [id number] Yes TAKE 1 TO 2 TABLETS BY MOUTH DAILY Einar Pheasant, MD Taking Active Child           Med Note De Hollingshead   Tue Nov 19, 2020  3:13 PM) 50 mg daily  timolol (BETIMOL) 0.5 % ophthalmic solution [id number] Yes 1 drop daily. [provider] Taking Active Child            Patient Active Problem List    Diagnosis Date Noted   Weakness 10/27/2020   Lobar pneumonia ([place])    Lactic acidosis    Acute metabolic encephalopathy    CAP (community acquired pneumonia) 10/18/2020   Cough 09/15/2020   Finger lesion 06/13/2020   Nasal lesion 06/13/2020   Change in hearing 12/18/2019   Finger laceration 02/21/2019   Gait abnormality 11/13/2018   Falls 08/28/2018   Anxiety 06/22/2018   Diarrhea 12/13/2017   Rectal bleeding 07/07/2017   Foot ulcer ([place]) 04/18/2017   Open wound of foot excluding toes without complication 93/73/4287   Health care maintenance 10/27/2014  Weight loss 12/27/2013   Thrombocytopenia ([place]) 11/20/2012   Hypertension 07/21/2012   Hypercholesterolemia 07/21/2012   History of CVA (cerebrovascular accident) 07/21/2012   Diabetes mellitus ([place]) 07/21/2012   Essential hypertension 07/21/2012   Pure hypercholesterolemia 07/21/2012   Type 2 diabetes mellitus ([place]) 07/21/2012    Immunization History  Administered Date(s) Administered   DTaP 03/21/2012   Fluad Quad(high Dose 65+) 04/19/2019, 06/12/2020   Influenza Split 03/21/2012, 04/03/2014   Influenza, High Dose Seasonal PF 04/30/2017, 05/02/2018   Influenza-Unspecified 04/09/2015, 04/14/2016, 04/28/2017   PFIZER(Purple Top)SARS-COV-2 Vaccination 08/10/2019, 08/31/2019   Pneumococcal Conjugate-13 07/09/2014   Pneumococcal Polysaccharide-23 07/09/2012   Tdap 02/16/2019    Conditions to be addressed/monitored: HTN, HLD, and DMII  Care Plan : Medication Management  Updates made by De Hollingshead, RPH-CPP since 01/22/2021 12:00 AM     Problem: Diabetes, hx CVA      Long-Range Goal: Disease Progression Prevention   Start Date: 11/15/2020  This Visit's Progress: On track  Recent Progress: On track  Priority: High  Note:   Current Barriers:  Unable to independently afford treatment regimen Unable to achieve control of diabetes   Pharmacist Clinical Goal(s):  Over the next 90 days, patient will verbalize  ability to afford treatment regimen Over the next 90 days, patient will achieve control of diabetes as evidenced by A1c through collaboration with PharmD and provider.   Interventions: 1:1 collaboration with Einar Pheasant, MD regarding development and update of comprehensive plan of care as evidenced by provider attestation and co-signature Inter-disciplinary care team collaboration (see longitudinal plan of care) Comprehensive medication review performed; medication list updated in electronic medical record  Health Maintenance: Due for COVID booster, Shingrix. Will discuss moving forward.  Up to date on eye exam, foot exam S/p recent fall, ED visit 6/17. Tripped.  Palliative Care referral in place.  Diabetes: Uncontrolled, but more relaxed goal of <8% with no side effects is likely appropriate; current treatment: Jardiance 20 mg daily- 2 10 mg tablets  Approved for Jardiance assistance through 07/19/21 Hx metformin therapy. Discontinued at recent hospitalization due to lactic acidosis Current glucose readings: fastings: 180-200s; post prandials: 300 - overall improving with Jardiance dose increase Denies any concerns with genital burning, itching which urinating, no complaints of troublesome polyuria. BP in ED was not low, appears that fall was not related to hypotensive symptoms s/p Jardiance dose increase.  Recommend to continue current regimen at this time. Reviewed overarching concepts of more relaxed A1c goal given age. [name] in agreement.  Will e-scribe Jardiance 25 mg tablet strength to [employer]. [name] notes they have 1 bottle of Jardiance 10 mg remaining, which will last 15 days at current dose. Encouraged to contact me if they have not received Jardiance 25 mg supply from [facility] in 2 weeks and I can provide a sample. Will follow up with [facility] in 2-3 days to ensure they are processing dose change.   Hypertension: Controlled at more relaxed goal of  ~150/90; current treatment: amlodipine 5 mg BID BP adequately controlled at recent ED visit as above.  Recommended to continue current regimen at this time.  Hyperlipidemia/ secondary ASCVD prevention Appropriately managed; current treatment: rosuvastatin 5 mg 2 days weekly (Wednesday and Saturday) Antiplatelet therapy: clopidogrel 75 mg daily (hx CVA)  Recommended to continue current regimen at this time.  Depression Appropriately managed per last report to PCP; current treatment: sertraline 50 mg daily (2 25 mg tabs) Previously advised [name] that the script could be changed to  a 50 mg tablet strength in the future.  Recommended to continue current regimen at this time.  Supplements: Vitamin D, omega 3 fatty acids, multivitamin  Patient Goals/Self-Care Activities Over the next 90 days, patient will:  - take medications as prescribed check glucose 2-3 times weekly, document, and provide at future appointments collaborate with provider on medication access solutions  Follow Up Plan: Telephone follow up appointment with care management team member scheduled for: ~12 weeks      Medication Assistance:  Jardiance obtained through [place] medication assistance program.  Enrollment ends 07/19/21  Patient's preferred pharmacy is:  [facility] [place], [address] [place] [address] Phone: [phone] Fax: [phone]  [facility]  ([place]) - [place], [place] [address] Phone: [phone] Fax: [phone]  [place], [place] - [address], [address] [place], [place] [address] Phone: [phone] Fax: [phone]   Follow Up:  Patient agrees to Care Plan and Follow-up.  Plan: Telephone follow up appointment with care management team member scheduled for:  ~ 12  weeks  Catie Darnelle Maffucci, PharmD, [place], [place] Clinical Pharmacist [employer] at [employer] [phone]

## 2021-01-23 ENCOUNTER — Telehealth: Payer: Self-pay | Admitting: Nurse Practitioner

## 2021-01-23 NOTE — Telephone Encounter (Signed)
Spoke with patient's daughter, Janalyn Shy, regarding the Palliative referral/services and all questions were answered and she was in agreement with beginning services with [place].  I have scheduled an In-home Consult for 01/30/21 @ 9 AM.

## 2021-01-24 ENCOUNTER — Ambulatory Visit: Payer: Medicare Other | Admitting: Pharmacist

## 2021-01-24 DIAGNOSIS — E1140 Type 2 diabetes mellitus with diabetic neuropathy, unspecified: Secondary | ICD-10-CM

## 2021-01-24 DIAGNOSIS — I10 Essential (primary) hypertension: Secondary | ICD-10-CM

## 2021-01-24 DIAGNOSIS — E7800 Pure hypercholesterolemia, unspecified: Secondary | ICD-10-CM

## 2021-01-24 NOTE — Chronic Care Management (AMB) (Signed)
Chronic Care Management Pharmacy Note  01/24/2021 Name:  Angela Patterson MRN:  [id number] DOB:  [birth year]   Subjective: Angela Patterson is an 87 y.o. year old female who is a primary patient of Einar Pheasant, MD.  The CCM team was consulted for assistance with disease management and care coordination needs.    Care coordination  for  medication access  in response to provider referral for pharmacy case management and/or care coordination services.   Consent to Services:  The patient was given information about Chronic Care Management services, agreed to services, and gave verbal consent prior to initiation of services.  Please see initial visit note for detailed documentation.   Patient Care Team: Einar Pheasant, MD as PCP - General (Internal Medicine) De Hollingshead, RPH-CPP (Pharmacist)   Objective:  Lab Results  Component Value Date   CREATININE 0.76 12/20/2020   CREATININE 0.49 10/19/2020   CREATININE 0.50 10/19/2020    Lab Results  Component Value Date   HGBA1C 8.7 (H) 12/20/2020   Last diabetic Eye exam:  Lab Results  Component Value Date/Time   HMDIABEYEEXA No Retinopathy 03/13/2019 12:00 AM    Last diabetic Foot exam:  Lab Results  Component Value Date/Time   HMDIABFOOTEX on my exam.  07/09/2014 12:00 AM        Component Value Date/Time   CHOL 138 12/20/2020 0818   TRIG 128.0 12/20/2020 0818   HDL 52.20 12/20/2020 0818   CHOLHDL 3 12/20/2020 0818   VLDL 25.6 12/20/2020 0818   LDLCALC 60 12/20/2020 0818   LDLDIRECT 107.0 06/12/2020 1332    Hepatic Function Latest Ref Rng & Units 12/20/2020 10/19/2020 10/18/2020  Total Protein 6.0 - 8.3 g/dL 7.6 6.9 7.5  Albumin 3.5 - 5.2 g/dL 4.2 3.5 3.9  AST 0 - 37 U/L 55(H) 23 25  ALT 0 - 35 U/L 51(H) 17 18  Alk Phosphatase 39 - 117 U/L 77 54 60  Total Bilirubin 0.2 - 1.2 mg/dL 0.7 0.5 0.6  Bilirubin, Direct 0.0 - 0.3 mg/dL 0.1 - -    Lab Results  Component Value Date/Time   TSH 2.47 06/12/2020 01:32 PM    TSH 3.04 04/17/2019 08:58 AM    CBC Latest Ref Rng & Units 12/20/2020 10/19/2020 10/18/2020  WBC 4.0 - 10.5 K/uL 7.0 11.2(H) 10.1  Hemoglobin 12.0 - 15.0 g/dL 15.4(H) 13.1 13.7  Hematocrit 36.0 - 46.0 % 45.8 40.3 41.1  Platelets 150.0 - 400.0 K/uL 116.0(L) 137(L) 163     Clinical ASCVD: Yes  The ASCVD Risk score Mikey Bussing DC Jr., et al., 2013) failed to calculate for the following reasons:   The 2013 ASCVD risk score is only valid for ages 39 to 36   The patient has a prior MI or stroke diagnosis      Social History   Tobacco Use  Smoking Status Never  Smokeless Tobacco Never   BP Readings from Last 3 Encounters:  10/19/20 140/75  06/12/20 138/70  02/20/19 138/70   Pulse Readings from Last 3 Encounters:  10/19/20 73  06/12/20 74  02/20/19 74   Wt Readings from Last 3 Encounters:  10/23/20 102 lb (46.3 kg)  10/18/20 103 lb 9.9 oz (47 kg)  07/15/20 102 lb (46.3 kg)    Assessment: Review of patient past medical history, allergies, medications, health status, including review of consultants reports, laboratory and other test data, was performed as part of comprehensive evaluation and provision of chronic care management services.   SDOH:  (  Social Determinants of Health) assessments and interventions performed:  SDOH Interventions    Flowsheet Row Most Recent Value  SDOH Interventions   Financial Strain Interventions Other (Comment)  [manufacturer assistance]       CCM Care Plan  Allergies  Allergen Reactions   Actos [Pioglitazone] Swelling   Contrast Media [Iodinated Diagnostic Agents] Swelling   Prandin [Repaglinide] Swelling   Pravastatin Sodium     cramps    Medications Reviewed Today     Reviewed by De Hollingshead, RPH-CPP (Pharmacist) on 01/22/21 at 1054  Med List Status: <None>   Medication Order Taking? Sig Documenting Provider Last Dose Status Informant  amLODipine (NORVASC) 5 MG tablet [id number] Yes TAKE 1 TABLET BY MOUTH  TWICE DAILY Einar Pheasant, MD Taking Active Child  Cholecalciferol (VITAMIN D) 2000 units tablet [id number] Yes Take 2,000 Units by mouth daily.  [provider] Taking Active Child  clopidogrel (PLAVIX) 75 MG tablet [id number] Yes TAKE 1 TABLET(75 MG) BY MOUTH DAILY Einar Pheasant, MD Taking Active Child  empagliflozin (JARDIANCE) 10 MG TABS tablet [id number] Yes Take 1 tablet (10 mg total) by mouth daily before breakfast. Einar Pheasant, MD Taking Active            Med Note Darnelle Maffucci, Arville Lime   Wed Jan 22, 2021 10:49 AM) Taking 20 mg daily  glucose blood test strip [id number]  OneTouch Ultra Test strips [provider]  Active Child  glucose blood test strip [id number]  USE TO CHECK BLOOD SUGAR  TWO TIMES DAILY. Dx E11.9 Einar Pheasant, MD  Active   Lancet Devices (ONE [facility] DELICA LANCING DEV) MISC [id number]  Use twice daily Dx: 250.00 Einar Pheasant, MD  Active Child  Multiple Vitamin (MULTIVITAMIN) tablet [id number] Yes Take 1 tablet by mouth daily. [provider] Taking Active   OMEGA-3 FATTY ACIDS PO [id number] Yes Take 720 mg by mouth daily. [provider] Taking Active Child  rosuvastatin (CRESTOR) 5 MG tablet [id number] Yes TAKE 1 TABLET(5 MG) BY MOUTH 2 TIMES A WEEK  Patient taking differently: TAKE 1 TABLET(5 MG) BY MOUTH 2 TIMES A WEEK. On Saturday and Wednesday   Einar Pheasant, MD Taking Active Child  sertraline (ZOLOFT) 25 MG tablet [id number] Yes TAKE 1 TO 2 TABLETS BY MOUTH DAILY Einar Pheasant, MD Taking Active Child           Med Note De Hollingshead   Tue Nov 19, 2020  3:13 PM) 50 mg daily  timolol (BETIMOL) 0.5 % ophthalmic solution [id number] Yes 1 drop daily. [provider] Taking Active Child            Patient Active Problem List   Diagnosis Date Noted   Weakness 10/27/2020   Lobar pneumonia ([place])    Lactic acidosis    Acute metabolic encephalopathy    CAP (community acquired pneumonia) 10/18/2020   Cough 09/15/2020   Finger  lesion 06/13/2020   Nasal lesion 06/13/2020   Change in hearing 12/18/2019   Finger laceration 02/21/2019   Gait abnormality 11/13/2018   Falls 08/28/2018   Anxiety 06/22/2018   Diarrhea 12/13/2017   Rectal bleeding 07/07/2017   Foot ulcer ([place]) 04/18/2017   Open wound of foot excluding toes without complication 73/41/9379   Health care maintenance 10/27/2014   Weight loss 12/27/2013   Thrombocytopenia ([place]) 11/20/2012   Hypertension 07/21/2012   Hypercholesterolemia 07/21/2012   History of CVA (cerebrovascular accident) 07/21/2012   Diabetes mellitus ([place]) 07/21/2012   Essential hypertension 07/21/2012  Pure hypercholesterolemia 07/21/2012   Type 2 diabetes mellitus ([place]) 07/21/2012    Immunization History  Administered Date(s) Administered   DTaP 03/21/2012   Fluad Quad(high Dose 65+) 04/19/2019, 06/12/2020   Influenza Split 03/21/2012, 04/03/2014   Influenza, High Dose Seasonal PF 04/30/2017, 05/02/2018   Influenza-Unspecified 04/09/2015, 04/14/2016, 04/28/2017   PFIZER(Purple Top)SARS-COV-2 Vaccination 08/10/2019, 08/31/2019   Pneumococcal Conjugate-13 07/09/2014   Pneumococcal Polysaccharide-23 07/09/2012   Tdap 02/16/2019    Conditions to be addressed/monitored: HLD and DMII  Care Plan : Medication Management  Updates made by De Hollingshead, RPH-CPP since 01/24/2021 12:00 AM     Problem: Diabetes, hx CVA      Long-Range Goal: Disease Progression Prevention   Start Date: 11/15/2020  This Visit's Progress: On track  Recent Progress: On track  Priority: High  Note:   Current Barriers:  Unable to independently afford treatment regimen Unable to achieve control of diabetes   Pharmacist Clinical Goal(s):  Over the next 90 days, patient will verbalize ability to afford treatment regimen Over the next 90 days, patient will achieve control of diabetes as evidenced by A1c through collaboration with PharmD and provider.   Interventions: 1:1 collaboration  with Einar Pheasant, MD regarding development and update of comprehensive plan of care as evidenced by provider attestation and co-signature Inter-disciplinary care team collaboration (see longitudinal plan of care) Comprehensive medication review performed; medication list updated in electronic medical record  Health Maintenance: Due for COVID booster, Shingrix. Will discuss moving forward.  Up to date on eye exam, foot exam S/p recent fall, ED visit 6/17. Tripped.  Palliative Care referral in place. Appointment scheduled next week.   Diabetes: Uncontrolled, but more relaxed goal of <8% with no side effects is likely appropriate; current treatment: Jardiance 20 mg daily- 2 10 mg tablets to complete supply and then start 25 mg tablet  Approved for Jardiance assistance through 07/19/21 Hx metformin therapy. Discontinued at recent hospitalization due to lactic acidosis Contacted [employer] [facility] to follow up on dose increase sent earlier this week. Jardiance 25 mg order was received, set to ship on Monday, 7/11, and should arrive within 5 business days. Notified family via [place].   Hypertension: Controlled at more relaxed goal of ~150/90; current treatment: amlodipine 5 mg BID  Previously recommended to continue current regimen.  Hyperlipidemia/ secondary ASCVD prevention Appropriately managed; current treatment: rosuvastatin 5 mg 2 days weekly (Wednesday and Saturday) Antiplatelet therapy: clopidogrel 75 mg daily (hx CVA)  Previously recommended to continue current regimen.  Depression Appropriately managed per last report to PCP; current treatment: sertraline 50 mg daily (2 25 mg tabs) Previously advised [name] that the script could be changed to a 50 mg tablet strength in the future.  Previously recommended to continue current regimen.  Supplements: Vitamin D, omega 3 fatty acids, multivitamin  Patient Goals/Self-Care Activities Over the next 90 days, patient will:  - take medications  as prescribed check glucose 2-3 times weekly, document, and provide at future appointments collaborate with provider on medication access solutions  Follow Up Plan: Telephone follow up appointment with care management team member scheduled for: ~12 weeks as previously scheduled       Medication Assistance:  Jardiance obtained through [place] medication assistance program.  Enrollment ends 07/19/21  Patient's preferred pharmacy is:  [facility] [place], [address] AT [address] [place] [address] Phone: [phone] Fax: [phone]  [facility]  (  [address], [address] [address] Phone: [phone] Fax: [phone]  [place], [place] - [address], [address] [place], [place] [address] Phone: [phone] Fax: [phone]    Follow Up:  Patient agrees to Care Plan and Follow-up.  Plan: Telephone follow up appointment with care management team member scheduled for:  ~ 12 weeks as previously scheduled  Catie Darnelle Maffucci, PharmD, [place], [place] Clinical Pharmacist [employer] [facility] [employer] [phone]

## 2021-01-24 NOTE — Patient Instructions (Signed)
Visit Information  PATIENT GOALS:  Goals Addressed               This Visit's Progress     Patient Stated     Medication Monitoring (pt-stated)        Patient Goals/Self-Care Activities Over the next 90 days, patient will:  - take medications as prescribed check glucose 2-3 times weekly, document, and provide at future appointments collaborate with provider on medication access solutions          Patient verbalizes understanding of instructions provided today and agrees to view in [place].   Plan: Telephone follow up appointment with care management team member scheduled for:  ~ 12 weeks as previously scheduled  Catie Darnelle Maffucci, PharmD, [place], [place] Clinical Pharmacist [employer] [facility] [employer] [phone]

## 2021-01-30 ENCOUNTER — Other Ambulatory Visit: Payer: Self-pay

## 2021-01-30 ENCOUNTER — Other Ambulatory Visit: Payer: Medicare Other | Admitting: Nurse Practitioner

## 2021-01-30 ENCOUNTER — Encounter: Payer: Self-pay | Admitting: Nurse Practitioner

## 2021-01-30 DIAGNOSIS — R630 Anorexia: Secondary | ICD-10-CM

## 2021-01-30 DIAGNOSIS — Z515 Encounter for palliative care: Secondary | ICD-10-CM

## 2021-01-30 NOTE — Progress Notes (Signed)
[facility] Palliative Care Consult Note Telephone: [phone]  Fax: [phone]   Date of encounter: 01/30/21 PATIENT NAME: Angela Patterson [id number]   [phone] (home)  DOB: 1921/02/01 MRN: [id number] PRIMARY CARE PROVIDER:    Einar Pheasant, MD,  [address] [place] [address] [phone]  RESPONSIBLE PARTY:    Contact Information     Name Relation Home Work Mobile   Marcell Barlow Daughter [phone]      I met face to face with patient and family in home. Palliative Care was asked to follow this patient by consultation request of  Einar Pheasant, MD to address advance care planning and complex medical decision making. This is the initial visit.   RECOMMENDATIONS:   Advance Care Planning/Goals of Care: Goals include to maximize quality of life and symptom management. Our advance care planning conversation included a discussion about:    The value and importance of advance care planning  Experiences with loved ones who have been seriously ill or have died  Exploration of personal, cultural or spiritual beliefs that might influence medical decisions  Exploration of goals of care in the event of a sudden injury or illness  Identification and preparation of a healthcare agent  Review and updating or creation of an  advance directive document . Decision not to resuscitate or to de-escalate disease focused treatments due to poor prognosis. CODE STATUS: DNR  Symptom Management/Plan: ACP: DNR completed and MOST form completed placed in vynca to say DNR/Limited treatment options/no feeding tube; wishes are for IVF/Antiobiotics  2. Anorexia, discussed nutrition, supplements, continue to measure, monitor weights  Right arm 20cm Right leg 30cm 12/20/2020 albumin 4.2/total protein 7.6 01/30/2021 current weight 101lbs; baseline weight 130; 15lb weight loss in last 3  months.  Severe muscle wasting;  3 Goals of Care: Goals include to maximize quality of life and symptom management. Our advance care planning conversation included a discussion about:    The value and importance of advance care planning  Exploration of personal, cultural or spiritual beliefs that might influence medical decisions  Exploration of goals of care in the event of a sudden injury or illness  Identification and preparation of a healthcare agent  Review and updating or creation of an advance directive document.  4. Palliative care encounter; Palliative care encounter; Palliative medicine team will continue to support patient, patient's family, and medical team. Visit consisted of counseling and education dealing with the complex and emotionally intense issues of symptom management and palliative care in the setting of serious and potentially life-threatening illness  Follow up Palliative Care Visit: depending on Hospice Physicians determine eligibility.    I spent 75 minutes providing this consultation. More than 50% of the time in this consultation was spent in counseling and care coordination.  PPS: 40%  HOSPICE ELIGIBILITY/DIAGNOSIS: TBD  Chief Complaint: Initial Palliative consult for complex medical decision making  HISTORY OF PRESENT ILLNESS:  Angela Patterson is a 87 y.o. 87 year old female  with h/o CVA,(nonhemorrhagic), TIA, DM, HTN, HLD, glaucoma, allergies, anxiety. Hospitalized 10/18/2020 to 10/19/2020 for right lower lobar pna, lactic acidosis secondary to metformin, acute metabolic encephalopathy with slurring words with CT brain negative. 01/03/2021 seen in ED secondary to fall with head laceration. I called [name] to confirm palliative care initial visit in person and COVID screening which was negative. I visited Ms. Carlota Patterson in her own home with her son Mr. Harbeck, daughter in law  and daughter [name]. We talked about purpose of palliative care visit. We talked about how Angela Patterson  has been feeling. Ms. Whittington was sitting in her chair smiling and clapping her hands. Ms Spells endorses "I am good". We talked about Ms Patterson currently living by herself though her family members do come in and out frequently and [name] spends a lot of time with Ms Patterson in her home. We talked about past medical history. We talked about recent fall in the emergency department. Ms Patterson showed the scab on the top of her head which appears to be healing period we talked about her functional abilities as she does walk with a walker or a cane if she is going out the front door to the porch. Angela Patterson has completed working with PT/OT. We talked about Ms Igarashi now requiring more assistance with bathing and dressing. Ms Lehrmann does have urinary incontinence and wears adult briefs. We talked about Ms Hervey Patterson appetite. Ms Patterson does feed herself though appetite varies. Her current weight is 101 lbs but normal weight for her was 130 lbs. [name] endorses over the last year she has lost more weight last 3 months over 15 lbs. We talked about the foods that she likes to eat as she continues to get Meals on Wheels. 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 Life review done. We talked about her being married but a widow over 30 years period she does have three adult children who help her daily. Mr. Binning endorses he and his wife call her at 10:00 o'clock and 6:00 o'clock everyday to check. [name] endorses cognitively she has been having more difficulty with remembering her son, other family members. [name] endorses at time speech is slower, sometimes more difficulty to understand. Cognitive changes over the last 3 months. Ms Nyoka Patterson e was wearing her life alert button. We talked about nutrition, muscle wasting in the setting of natural aging. We talked about symptoms including pain for which Ms Patterson denies. Angela Patterson endorses that she feels good. We talked about sleep patterns. Ms Patterson endorses she does on  occasion get up once or twice a night to use the bathroom. We talked about physical therapy and occupational for which she has completed. We talked about generalized weakness in her legs and concerned for falling. We talked about fall risk. We talked about possibly putting cameras up if she is continuing to stay by herself at night that way family can see what is happening. We talked about medical goals of care including aggressive versus conservative versus comfort care period we talked about CPR. Mr. Barrie and [name] both in agreement to a do not resuscitate. [place] form completed. We talked about most form. Completed MOST form to read DNR, limited interventions wishes are not to have intubated. Wishes are for Ivy fluids and antibiotics. No feeding tube.Most form also placed in v[place]a. We talked about Medicare benefit through Hospice services. We talked about what they would provide. We talked about Hospice criteria. Mr. Dabney endorses his mother-in-law had Hospice and that was very helpful. [name] Mr. Nyoka Patterson both in agreement to have Hospice Physicians review case. We talked about role of palliative care and plan of care. Ms Anwar was cooperative with assessment. Therapeutic listing and emotional support provided. Discussed will contact [name] once Hospice Physicians determine eligibility in either proceed with Hospice or schedule follow up palliative care visit. [name]  and Mr. Nyoka Patterson in agreement. Ms Bronder was very cooperative, interactive during palliative  care visit. She talked about a quilt she had made that one national recognition and is currently hanging in a church. Praise Ms ponciano for continuing to try to do her exercises come activities and things that she enjoys in her life to improve quality of care period contact information provided.  History obtained from review of EMR, discussion with Daughter [name], son, daughter in law and  Ms. Carlota Patterson.  I reviewed available labs, medications, imaging,  studies and related documents from the EMR.  Records reviewed and summarized above.   ROS Full 14 system review of systems performed and negative with exception of: as per HPI.   Physical Exam: Constitutional: NAD General: frail appearing, thin, elderly pleasant female EYES: lids intact, ENMT: oral mucous membranes moist CV: S1S2, RRR Pulmonary: LCTA, no increased work of breathing, no cough, room air Abdomen: soft and non tender MSK: ambulatory with walker or cane; muscle wasting Skin: warm and dry, no rashes or wounds on visible skin Neuro:  + generalized weakness,  +mild cognitive impairment forgetful Psych: non-anxious affect, A and O x 3  CURRENT PROBLEM LIST:  Patient Active Problem List   Diagnosis Date Noted   Weakness 10/27/2020   Lobar pneumonia ([place])    Lactic acidosis    Acute metabolic encephalopathy    CAP (community acquired pneumonia) 10/18/2020   Cough 09/15/2020   Finger lesion 06/13/2020   Nasal lesion 06/13/2020   Change in hearing 12/18/2019   Finger laceration 02/21/2019   Gait abnormality 11/13/2018   Falls 08/28/2018   Anxiety 06/22/2018   Diarrhea 12/13/2017   Rectal bleeding 07/07/2017   Foot ulcer ([place]) 04/18/2017   Open wound of foot excluding toes without complication 28/31/5176   Health care maintenance 10/27/2014   Weight loss 12/27/2013   Thrombocytopenia ([place]) 11/20/2012   Hypertension 07/21/2012   Hypercholesterolemia 07/21/2012   History of CVA (cerebrovascular accident) 07/21/2012   Diabetes mellitus ([place]) 07/21/2012   Essential hypertension 07/21/2012   Pure hypercholesterolemia 07/21/2012   Type 2 diabetes mellitus ([place]) 07/21/2012   PAST MEDICAL HISTORY:  Active Ambulatory Problems    Diagnosis Date Noted   Hypertension 07/21/2012   Hypercholesterolemia 07/21/2012   History of CVA (cerebrovascular accident) 07/21/2012   Diabetes mellitus ([place]) 07/21/2012   Thrombocytopenia ([place]) 11/20/2012   Weight loss 12/27/2013    Health care maintenance 10/27/2014   Open wound of foot excluding toes without complication 16/01/3709   Essential hypertension 07/21/2012   Pure hypercholesterolemia 07/21/2012   Type 2 diabetes mellitus ([place]) 07/21/2012   Foot ulcer ([place]) 04/18/2017   Rectal bleeding 07/07/2017   Diarrhea 12/13/2017   Anxiety 06/22/2018   Falls 08/28/2018   Gait abnormality 11/13/2018   Finger laceration 02/21/2019   Change in hearing 12/18/2019   Finger lesion 06/13/2020   Nasal lesion 06/13/2020   Cough 09/15/2020   CAP (community acquired pneumonia) 10/18/2020   Lobar pneumonia ([place])    Lactic acidosis    Acute metabolic encephalopathy    Weakness 10/27/2020   Resolved Ambulatory Problems    Diagnosis Date Noted   Cerebral artery occlusion with cerebral infarction ([place]) 07/21/2012   URI (upper respiratory infection) 04/18/2017   Past Medical History:  Diagnosis Date   Allergy    Chicken pox    CVA (cerebral vascular accident) ([place])    SOCIAL HX:  Social History   Tobacco Use   Smoking status: Never   Smokeless tobacco: Never  Substance Use Topics   Alcohol use: No    Alcohol/week: 0.0  standard drinks   FAMILY HX:  Family History  Problem Relation Age of Onset   Liver cancer Father    Stroke Mother    Heart attack Brother    Hypertension Daughter    Hypertension Son    Hypertension Daughter    Cancer Grandchild        breast   Diabetes Grandchild    Breast cancer Neg Hx    Colon cancer Neg Hx     Reviewed  ALLERGIES:  Allergies  Allergen Reactions   Actos [Pioglitazone] Swelling   Contrast Media [Iodinated Diagnostic Agents] Swelling   Prandin [Repaglinide] Swelling   Pravastatin Sodium     cramps     PERTINENT MEDICATIONS:  Outpatient Encounter Medications as of 01/30/2021  Medication Sig   amLODipine (NORVASC) 5 MG tablet TAKE 1 TABLET BY MOUTH  TWICE DAILY   Cholecalciferol (VITAMIN D) 2000 units tablet Take 2,000 Units by mouth daily.    clopidogrel  (PLAVIX) 75 MG tablet TAKE 1 TABLET(75 MG) BY MOUTH DAILY   empagliflozin (JARDIANCE) 25 MG TABS tablet Take 1 tablet (25 mg total) by mouth daily.   glucose blood test strip OneTouch Ultra Test strips   glucose blood test strip USE TO CHECK BLOOD SUGAR  TWO TIMES DAILY. Dx E11.9   Lancet Devices (ONE TOUCH DELICA LANCING DEV) MISC Use twice daily Dx: 250.00   Multiple Vitamin (MULTIVITAMIN) tablet Take 1 tablet by mouth daily.   OMEGA-3 FATTY ACIDS PO Take 720 mg by mouth daily.   rosuvastatin (CRESTOR) 5 MG tablet TAKE 1 TABLET(5 MG) BY MOUTH 2 TIMES A WEEK (Patient taking differently: TAKE 1 TABLET(5 MG) BY MOUTH 2 TIMES A WEEK. On Saturday and Wednesday)   sertraline (ZOLOFT) 25 MG tablet TAKE 1 TO 2 TABLETS BY MOUTH DAILY   timolol (BETIMOL) 0.5 % ophthalmic solution 1 drop daily.   No facility-administered encounter medications on file as of 01/30/2021.  Questions and concerns were addressed. The patient/family was encouraged to call with questions and/or concerns. My business card was provided. Provided general support and encouragement, no other unmet needs identified   Thank you for the opportunity to participate in the care of Ms. Carlota Patterson.  The palliative care team will continue to follow. Please call our office at [phone] if we can be of additional assistance.   This chart was dictated using voice recognition software.  Despite best efforts to proofread,  errors can occur which can change the documentation meaning.   Ole Lafon Z Mirna Sutcliffe, NP ,   COVID-19 PATIENT SCREENING TOOL Asked and negative response unless otherwise noted:  Have you had symptoms of covid, tested positive or been in contact with someone with symptoms/positive test in the past 5-10 days? NO

## 2021-01-31 ENCOUNTER — Telehealth: Payer: Self-pay | Admitting: Internal Medicine

## 2021-01-31 ENCOUNTER — Telehealth: Payer: Self-pay | Admitting: Nurse Practitioner

## 2021-01-31 DIAGNOSIS — E1140 Type 2 diabetes mellitus with diabetic neuropathy, unspecified: Secondary | ICD-10-CM

## 2021-01-31 DIAGNOSIS — I10 Essential (primary) hypertension: Secondary | ICD-10-CM

## 2021-01-31 DIAGNOSIS — R634 Abnormal weight loss: Secondary | ICD-10-CM

## 2021-01-31 DIAGNOSIS — E7800 Pure hypercholesterolemia, unspecified: Secondary | ICD-10-CM

## 2021-01-31 NOTE — Telephone Encounter (Signed)
Angela Patterson from [facility] called, Please fax a Hospice order to [phone].

## 2021-01-31 NOTE — Telephone Encounter (Signed)
I received update from [facility]osp[facility] Physicians Ms. Lascala was Hospice eligible. I called [name] [name] daughter, updated. Wishes are to proceed with Hospice order. I called Dr Bary Leriche office to request order for Hospice to be faxed over.

## 2021-01-31 NOTE — Telephone Encounter (Signed)
A referral for Hospice is being requested

## 2021-02-02 NOTE — Addendum Note (Signed)
Addended by: Alisa Graff on: 02/02/2021 11:17 AM   Modules accepted: Orders

## 2021-02-02 NOTE — Telephone Encounter (Signed)
Order placed for referral to hospice. Please confirm what else needs to be done or what needs to be faxed.  She has palliative care in place now.  Just let me know if something more needs to be done.

## 2021-02-03 NOTE — Telephone Encounter (Signed)
Per chart, hospice referral is being processed.

## 2021-02-04 ENCOUNTER — Other Ambulatory Visit: Payer: Self-pay

## 2021-02-04 ENCOUNTER — Ambulatory Visit (INDEPENDENT_AMBULATORY_CARE_PROVIDER_SITE_OTHER): Payer: Medicare Other | Admitting: Podiatry

## 2021-02-04 DIAGNOSIS — M79675 Pain in left toe(s): Secondary | ICD-10-CM | POA: Diagnosis not present

## 2021-02-04 DIAGNOSIS — E0843 Diabetes mellitus due to underlying condition with diabetic autonomic (poly)neuropathy: Secondary | ICD-10-CM

## 2021-02-04 DIAGNOSIS — L97522 Non-pressure chronic ulcer of other part of left foot with fat layer exposed: Secondary | ICD-10-CM

## 2021-02-04 DIAGNOSIS — M79674 Pain in right toe(s): Secondary | ICD-10-CM | POA: Diagnosis not present

## 2021-02-04 DIAGNOSIS — B351 Tinea unguium: Secondary | ICD-10-CM | POA: Diagnosis not present

## 2021-02-04 NOTE — Progress Notes (Signed)
   Subjective:  87 year old female with PMHx of DM presenting today for follow up evaluation of an ulceration of the left foot.  Has been presenting to the office for routine conservative management of the wound.  Both the patient and her family have opted for conservative management and to avoid any surgical intervention to help alleviate any symptoms the patient may have.    Patient also is requesting a routine nail debridement.  She is unable to trim her own nails and they are becoming sensitive due to the length and thickness of the nails.  They present today for routine treatment  Past Medical History:  Diagnosis Date   Allergy    Chicken pox    CVA (cerebral vascular accident) ([place])    Diabetes mellitus ([place])    Hypercholesterolemia    Hypertension       Objective/Physical Exam General: The patient is alert and oriented x3 in no acute distress.  Dermatology:  Today it appears that the wound to the plantar aspect of the foot has recently healed.  Complete reepithelialization has occurred.  The area is very callused however there is no open wound noted.   Skin is warm, dry and supple bilateral lower extremities.  Hyperkeratotic thickened elongated discolored dystrophic nails noted 1-5 bilateral  Vascular: Palpable pedal pulses bilaterally.  Erythema resolved. Capillary refill within normal limits.  Neurological: Epicritic and protective threshold diminished bilaterally.   Musculoskeletal Exam: Range of motion within normal limits to all pedal and ankle joints bilateral. Muscle strength 5/5 in all groups bilateral.   Assessment: #1 ulceration of the sub-first MPJ of the left foot secondary to diabetes mellitus #2 diabetes mellitus w/ peripheral neuropathy  Plan of Care:  #1 Patient was evaluated. #2  Light debridement was performed of the callus tissue around the recently healed wound area.  No significant bleeding noted. 3.  Continue Silvadene cream daily PRN 4.  Recommend  lotion daily.  Lotion was provided today for the patient 5.  Continue diabetic shoes and insoles with offloading felt metatarsal pads  6.  Mechanical debridement of nails 1-5 bilateral was performed using a nail nipper without incident  7.  Return to clinic in 3 months for routine foot care   [name], DPM [facility]  Dr. [name], DPM    [address], [place] [address]                Office [phone]  Fax [phone]

## 2021-02-06 NOTE — Telephone Encounter (Signed)
Note from 7/19 states that hospice assessment has been scheduled

## 2021-02-17 ENCOUNTER — Ambulatory Visit: Payer: Medicare Other | Admitting: Internal Medicine

## 2021-02-18 ENCOUNTER — Telehealth: Payer: Self-pay | Admitting: Nurse Practitioner

## 2021-02-18 NOTE — Telephone Encounter (Signed)
Spoke with patient's daughter, [name], and have scheduled a Telephone Palliative f/u visit for 03/05/21 @ 9:30 AM.

## 2021-03-05 ENCOUNTER — Other Ambulatory Visit: Payer: Self-pay

## 2021-03-05 ENCOUNTER — Encounter: Payer: Self-pay | Admitting: Nurse Practitioner

## 2021-03-05 ENCOUNTER — Other Ambulatory Visit: Payer: Medicare Other | Admitting: Nurse Practitioner

## 2021-03-05 DIAGNOSIS — Z515 Encounter for palliative care: Secondary | ICD-10-CM

## 2021-03-05 DIAGNOSIS — R630 Anorexia: Secondary | ICD-10-CM

## 2021-03-05 NOTE — Progress Notes (Signed)
[facility] Consult Note Telephone: [phone]  Fax: [phone]    Date of encounter: 03/05/21 PATIENT NAME: Angela Patterson [place] [address]   [phone] (home)  DOB: Jul 21, 1920 MRN: [id number] PRIMARY CARE PROVIDER:    Einar Pheasant, MD,  [id number] [place] [address] [phone]  RESPONSIBLE PARTY:    Contact Information     Name Relation Home Work Nara Visa E Daughter [phone]      Due to the COVID-19 crisis, this visit was done via telemedicine from my office and it was initiated and consent by this patient and or family.  I connected with [name], Angela Patterson daughter with  DONNI BORDE on 03/05/21 by phone as video not available enabled telemedicine application and verified that I am speaking with the correct person   I discussed the limitations of evaluation and management by telemedicine. The patient expressed understanding and agreed to proceed. Palliative Care was asked to follow this patient by consultation request of  Einar Pheasant, MD to address advance care planning and complex medical decision making. This is a follow up visit.                                   ASSESSMENT AND PLAN / RECOMMENDATIONS:   Symptom Management/Plan: ACP: DNR completed and MOST form completed placed in vynca to say DNR/Limited treatment options/no feeding tube; wishes are for IVF/Antiobiotics   2. Anorexia, improving per [name]; discussed nutrition, supplements, continue to measure, monitor weights   3 Palliative care encounter; Palliative care encounter; Palliative medicine team will continue to support patient, patient's family, and medical team. Visit consisted of counseling and education dealing with the complex and emotionally intense issues of symptom management and palliative care in the setting of serious and potentially life-threatening  illness  Follow up Palliative Care Visit: Palliative care will continue to follow for complex medical decision making, advance care planning, and clarification of goals. Return 8 weeks or prn.  I spent 32 minutes providing this consultation. More than 50% of the time in this consultation was spent in counseling and care coordination.  PPS: 40%  HOSPICE ELIGIBILITY/DIAGNOSIS: yes per Hospice physicians  Chief Complaint: Follow up palliative consult for complex medical decision making  HISTORY OF PRESENT ILLNESS:  Angela Patterson is a 87 y.o. year old female  with with multiple medical problems including h/o CVA,(nonhemorrhagic), TIA, DM, HTN, HLD, glaucoma, allergies, anxiety. Hospitalized 10/18/2020 to 10/19/2020 for right lower lobar pna, lactic acidosis secondary to metformin, acute metabolic encephalopathy with slurring words with CT brain negative. 01/03/2021 seen in ED secondary to fall with head laceration. I called [name], Ms. Everard's daughter for telemedicine telephonic as video not available f/u PC visit. [name] in agreement. We talked about how Ms. Frieden has been doing. [name] endorses she has been doing good. Ms. Liebmann has gained small amount of weight with improved appetite. Ms. Clarida has had no recent falls, infections, hospitalizations. Ms. Chisman is followed by podiatry with last visit on 02/04/2021 for ulceration of the sub-first MPJ of the left foot secondary to diabetes mellitus, diabetes mellitus w/ peripheral neuropathy. [name] endorses. No symptoms of pain, or sob. Ms. Sorey has been sleeping, no further cognitive decline. We talked about medical goals. We talked about hospice av visit which [name] endorses she would not want for Angela Patterson to  give up her insurance or primary care provider which she would have to do if hospice started. Explained to [facility] Ms. Daiuto would not have to give up her primary physician as it is encouraged that the primary remain attending. [name] endorses she  would not be able to see podiatry. Explained that Ms. Camber would be able to see her specialists. Also the insurance she would go back to traditional medicare. [name] endorses should she be discharged from hospice she would not be able to get her insurance back. Explained she would have insurance, asked [name] if it would be okay if PC SW contacts her to explain about the insurance piece. [name] in agreement. Email sent to [employer] [facility] SW to reach out to answer questions about insurance. [name] endorses they are currently not interested in hospice as they feel Ms. Dillahunt is doing well. We talked about disease progression. Talked about role pc in poc. We talked about scheduling f/u pc visit in 2 months, [name] in agreement, appointment scheduled. Therapeutic listening, emotional support provided. Questions answered.   History obtained from review of EMR, discussion per phone with [name], daughter with Angela Patterson.  I reviewed available labs, medications, imaging, studies and related documents from the EMR.  Records reviewed and summarized above.   ROS Full 14 system review of systems performed and negative with exception of: as per HPI.   Physical Exam: deferred Questions and concerns were addressed. The patient/family was encouraged to call with questions and/or concerns. was provided. Provided general support and encouragement, no other unmet needs identified   Thank you for the opportunity to participate in the care of Angela Patterson.  The palliative care team will continue to follow. Please call our office at [phone] if we can be of additional assistance.   This office note has been dictated.   Neima Lacross Ihor Gully, NP

## 2021-03-10 ENCOUNTER — Encounter: Payer: Self-pay | Admitting: Internal Medicine

## 2021-03-14 ENCOUNTER — Telehealth (INDEPENDENT_AMBULATORY_CARE_PROVIDER_SITE_OTHER): Payer: Medicare Other | Admitting: Internal Medicine

## 2021-03-14 DIAGNOSIS — F419 Anxiety disorder, unspecified: Secondary | ICD-10-CM

## 2021-03-14 DIAGNOSIS — E7800 Pure hypercholesterolemia, unspecified: Secondary | ICD-10-CM | POA: Diagnosis not present

## 2021-03-14 DIAGNOSIS — L97529 Non-pressure chronic ulcer of other part of left foot with unspecified severity: Secondary | ICD-10-CM

## 2021-03-14 DIAGNOSIS — I10 Essential (primary) hypertension: Secondary | ICD-10-CM

## 2021-03-14 DIAGNOSIS — Z8673 Personal history of transient ischemic attack (TIA), and cerebral infarction without residual deficits: Secondary | ICD-10-CM

## 2021-03-14 DIAGNOSIS — D696 Thrombocytopenia, unspecified: Secondary | ICD-10-CM

## 2021-03-14 DIAGNOSIS — L97509 Non-pressure chronic ulcer of other part of unspecified foot with unspecified severity: Secondary | ICD-10-CM | POA: Diagnosis not present

## 2021-03-14 DIAGNOSIS — E11621 Type 2 diabetes mellitus with foot ulcer: Secondary | ICD-10-CM

## 2021-03-14 DIAGNOSIS — J181 Lobar pneumonia, unspecified organism: Secondary | ICD-10-CM | POA: Diagnosis not present

## 2021-03-14 DIAGNOSIS — I70245 Atherosclerosis of native arteries of left leg with ulceration of other part of foot: Secondary | ICD-10-CM

## 2021-03-14 MED ORDER — SERTRALINE HCL 25 MG PO TABS
25.0000 mg | ORAL_TABLET | Freq: Every day | ORAL | 1 refills | Status: DC
Start: 2021-03-14 — End: 2021-10-22

## 2021-03-14 MED ORDER — ROSUVASTATIN CALCIUM 5 MG PO TABS: ORAL_TABLET | ORAL | 1 refills | Status: AC

## 2021-03-14 NOTE — Progress Notes (Signed)
Patient ID: Angela Patterson, female   DOB: Feb 12, 1921, 87 y.o.   MRN: [id number]   Virtual Visit via vidoe Note  This visit type was conducted due to national recommendations for restrictions regarding the COVID-19 pandemic (e.g. social distancing).  This format is felt to be most appropriate for this patient at this time.  All issues noted in this document were discussed and addressed.  No physical exam was performed (except for noted visual exam findings with Video Visits).   I connected with [name] by a video enabled telemedicine application or telephone and verified that I am speaking with the correct person using two identifiers. Location patient: home Location provider: work  Persons participating in the virtual visit: patient, provider and pts daughter [name]  The limitations, risks, security and privacy concerns of performing an evaluation and management service by video and the availability of in person appointments have been discussed.  It has also been discussed with the patient that there may be a patient responsible charge related to this service. The patient expressed understanding and agreed to proceed.   Reason for visit: scheduled follow up.  HPI: Follow up regarding her diabetes.  Under palliative care.  Using a three wheeled walker to get around.  Using a cane outdoors.  On jardiance.  Tolerating.  Checks sugars 2x/week.  In 200s.  On crestor.  Tolerating.  No chest pain.  Breathing overall stable.  Some minimal sob with exertion, but overall stable.  Occasional cough.  No congestion.  No acid reflux reported.  No abdominal pain.  Bowels moving.  No diarrhea.     ROS: See pertinent positives and negatives per HPI.  Past Medical History:  Diagnosis Date   Allergy    Chicken pox    CVA (cerebral vascular accident) ([place])    Diabetes mellitus ([place])    Hypercholesterolemia    Hypertension     Past Surgical History:  Procedure Laterality Date   ABDOMINAL HYSTERECTOMY   1971   excessive bleeding   APPENDECTOMY  age 89   BREAST LUMPECTOMY  1958   benign   DILATION AND CURETTAGE OF UTERUS  1970    Family History  Problem Relation Age of Onset   Liver cancer Father    Stroke Mother    Heart attack Brother    Hypertension Daughter    Hypertension Son    Hypertension Daughter    Cancer Grandchild        breast   Diabetes Grandchild    Breast cancer Neg Hx    Colon cancer Neg Hx     SOCIAL HX: reviewed.    Current Outpatient Medications:    amLODipine (NORVASC) 5 MG tablet, TAKE 1 TABLET BY MOUTH  TWICE DAILY, Disp: 180 tablet, Rfl: 3   Cholecalciferol (VITAMIN D) 2000 units tablet, Take 2,000 Units by mouth daily. , Disp: , Rfl:    clopidogrel (PLAVIX) 75 MG tablet, TAKE 1 TABLET(75 MG) BY MOUTH DAILY, Disp: 90 tablet, Rfl: 1   empagliflozin (JARDIANCE) 25 MG TABS tablet, Take 1 tablet (25 mg total) by mouth daily., Disp: 90 tablet, Rfl: 3   glucose blood test strip, OneTouch Ultra Test strips, Disp: , Rfl:    glucose blood test strip, USE TO CHECK BLOOD SUGAR  TWO TIMES DAILY. Dx E11.9, Disp: 200 each, Rfl: 1   Lancet Devices (ONE TOUCH DELICA LANCING DEV) MISC, Use twice daily Dx: 250.00, Disp: 100 each, Rfl: 5   Multiple Vitamin (MULTIVITAMIN) tablet, Take 1  tablet by mouth daily., Disp: , Rfl:    OMEGA-3 FATTY ACIDS PO, Take 720 mg by mouth daily., Disp: , Rfl:    rosuvastatin (CRESTOR) 5 MG tablet, TAKE 1 TABLET(5 MG) BY MOUTH 2 TIMES A WEEK, Disp: 26 tablet, Rfl: 1   sertraline (ZOLOFT) 25 MG tablet, Take 1-2 tablets (25-50 mg total) by mouth daily., Disp: 180 tablet, Rfl: 1   timolol (BETIMOL) 0.5 % ophthalmic solution, 1 drop daily., Disp: , Rfl:   EXAM:  GENERAL: alert, oriented, appears well and in no acute distress  HEENT: atraumatic, conjunttiva clear, no obvious abnormalities on inspection of external nose and ears  NECK: normal movements of the head and neck  LUNGS: on inspection no signs of respiratory distress, breathing  rate appears normal, no obvious gross SOB, gasping or wheezing  CV: no obvious cyanosis  PSYCH/NEURO: pleasant and cooperative, no obvious depression or anxiety, speech and thought processing grossly intact  ASSESSMENT AND PLAN:  Discussed the following assessment and plan:  Problem List Items Addressed This Visit     Anxiety    Continue zoloft.  Stable.       Relevant Medications   sertraline (ZOLOFT) 25 MG tablet   Foot ulcer ([place])    Followed by podiatry.  Stable.        History of CVA (cerebrovascular accident)    Continue plavix.  Follow.        Hypertension    Blood pressure has been doing well on amlodipine.  Follow pressures.  Follow metabolic panel.       Relevant Medications   rosuvastatin (CRESTOR) 5 MG tablet   Lobar pneumonia ([place])    F/u CXR 12/2020 - clear.        Pure hypercholesterolemia    On crestor.  Follow lipid panel and liver function tests.        Relevant Medications   rosuvastatin (CRESTOR) 5 MG tablet   Thrombocytopenia (H[facility])    Platelet count - relatively stable - 125.  Follow cbc.       Type 2 diabetes mellitus ([place])    Remain off metformin.  Recent lactic acidosis.  On jardiance.  Follow sugars and send in readings.  Follow met b and a1c.       Relevant Medications   rosuvastatin (CRESTOR) 5 MG tablet    Return in about 3 months (around 06/14/2021) for follow up appt (42mn).   I discussed the assessment and treatment plan with the patient. The patient was provided an opportunity to ask questions and all were answered. The patient agreed with the plan and demonstrated an understanding of the instructions.   The patient was advised to call back or seek an in-person evaluation if the symptoms worsen or if the condition fails to improve as anticipated.   CEinar Pheasant MD

## 2021-03-15 ENCOUNTER — Encounter: Payer: Self-pay | Admitting: Internal Medicine

## 2021-03-15 NOTE — Assessment & Plan Note (Signed)
Followed by podiatry.  Stable.

## 2021-03-15 NOTE — Assessment & Plan Note (Signed)
F/u CXR 12/2020 - clear.

## 2021-03-15 NOTE — Assessment & Plan Note (Signed)
Continue zoloft.  Stable.  

## 2021-03-15 NOTE — Assessment & Plan Note (Signed)
Continue plavix.  Follow.

## 2021-03-15 NOTE — Assessment & Plan Note (Signed)
Blood pressure has been doing well on amlodipine.  Follow pressures.  Follow metabolic panel.

## 2021-03-15 NOTE — Assessment & Plan Note (Signed)
Platelet count - relatively stable - 125.  Follow cbc.

## 2021-03-15 NOTE — Assessment & Plan Note (Signed)
On crestor.  Follow lipid panel and liver function tests.   

## 2021-03-15 NOTE — Assessment & Plan Note (Signed)
Remain off metformin.  Recent lactic acidosis.  On jardiance.  Follow sugars and send in readings.  Follow met b and a1c.

## 2021-03-28 ENCOUNTER — Telehealth: Payer: Self-pay | Admitting: Internal Medicine

## 2021-03-28 NOTE — Telephone Encounter (Signed)
Daughter will call back to set up 50mfollow up with Dr SNicki Reaper

## 2021-04-17 ENCOUNTER — Other Ambulatory Visit: Payer: Self-pay | Admitting: Internal Medicine

## 2021-04-21 ENCOUNTER — Other Ambulatory Visit: Payer: Self-pay | Admitting: Internal Medicine

## 2021-04-22 ENCOUNTER — Ambulatory Visit (INDEPENDENT_AMBULATORY_CARE_PROVIDER_SITE_OTHER): Payer: Medicare Other | Admitting: Pharmacist

## 2021-04-22 DIAGNOSIS — L97509 Non-pressure chronic ulcer of other part of unspecified foot with unspecified severity: Secondary | ICD-10-CM

## 2021-04-22 DIAGNOSIS — Z8673 Personal history of transient ischemic attack (TIA), and cerebral infarction without residual deficits: Secondary | ICD-10-CM

## 2021-04-22 DIAGNOSIS — E11621 Type 2 diabetes mellitus with foot ulcer: Secondary | ICD-10-CM

## 2021-04-22 DIAGNOSIS — E7800 Pure hypercholesterolemia, unspecified: Secondary | ICD-10-CM

## 2021-04-22 NOTE — Chronic Care Management (AMB) (Signed)
Chronic Care Management Pharmacy Note  04/22/2021 Name:  Angela Patterson MRN:  [id number] DOB:  05-Nov-1920  Subjective: Angela Patterson is an 87 y.o. year old female who is a primary patient of Angela Pheasant, MD.  The CCM team was consulted for assistance with disease management and care coordination needs.    Engaged with patient's daughter, [name]  by telephone for follow up visit in response to provider referral for pharmacy case management and/or care coordination services.   Consent to Services:  The patient was given information about Chronic Care Management services, agreed to services, and gave verbal consent prior to initiation of services.  Please see initial visit note for detailed documentation.   Patient Care Team: Angela Pheasant, MD as PCP - General (Internal Medicine) De Hollingshead, RPH-CPP (Pharmacist)   Objective:  Lab Results  Component Value Date   CREATININE 0.76 12/20/2020   CREATININE 0.49 10/19/2020   CREATININE 0.50 10/19/2020    Lab Results  Component Value Date   HGBA1C 8.7 (H) 12/20/2020   Last diabetic Eye exam:  Lab Results  Component Value Date/Time   HMDIABEYEEXA No Retinopathy 03/13/2019 12:00 AM    Last diabetic Foot exam:  Lab Results  Component Value Date/Time   HMDIABFOOTEX on my exam.  07/09/2014 12:00 AM        Component Value Date/Time   CHOL 138 12/20/2020 0818   TRIG 128.0 12/20/2020 0818   HDL 52.20 12/20/2020 0818   CHOLHDL 3 12/20/2020 0818   VLDL 25.6 12/20/2020 0818   LDLCALC 60 12/20/2020 0818   LDLDIRECT 107.0 06/12/2020 1332    Hepatic Function Latest Ref Rng & Units 12/20/2020 10/19/2020 10/18/2020  Total Protein 6.0 - 8.3 g/dL 7.6 6.9 7.5  Albumin 3.5 - 5.2 g/dL 4.2 3.5 3.9  AST 0 - 37 U/L 55(H) 23 25  ALT 0 - 35 U/L 51(H) 17 18  Alk Phosphatase 39 - 117 U/L 77 54 60  Total Bilirubin 0.2 - 1.2 mg/dL 0.7 0.5 0.6  Bilirubin, Direct 0.0 - 0.3 mg/dL 0.1 - -    Lab Results  Component Value Date/Time    TSH 2.47 06/12/2020 01:32 PM   TSH 3.04 04/17/2019 08:58 AM    CBC Latest Ref Rng & Units 12/20/2020 10/19/2020 10/18/2020  WBC 4.0 - 10.5 K/uL 7.0 11.2(H) 10.1  Hemoglobin 12.0 - 15.0 g/dL 15.4(H) 13.1 13.7  Hematocrit 36.0 - 46.0 % 45.8 40.3 41.1  Platelets 150.0 - 400.0 K/uL 116.0(L) 137(L) 163      Social History   Tobacco Use  Smoking Status Never  Smokeless Tobacco Never   BP Readings from Last 3 Encounters:  10/19/20 140/75  06/12/20 138/70  02/20/19 138/70   Pulse Readings from Last 3 Encounters:  10/19/20 73  06/12/20 74  02/20/19 74   Wt Readings from Last 3 Encounters:  03/14/21 103 lb (46.7 kg)  10/23/20 102 lb (46.3 kg)  10/18/20 103 lb 9.9 oz (47 kg)    Assessment: Review of patient past medical history, allergies, medications, health status, including review of consultants reports, laboratory and other test data, was performed as part of comprehensive evaluation and provision of chronic care management services.   SDOH:  (Social Determinants of Health) assessments and interventions performed:  SDOH Interventions    Flowsheet Row Most Recent Value  SDOH Interventions   Financial Strain Interventions Other (Comment)  [manufacturer assistance]       CCM Care Plan  Allergies  Allergen Reactions  Actos [Pioglitazone] Swelling   Contrast Media [Iodinated Diagnostic Agents] Swelling   Prandin [Repaglinide] Swelling   Pravastatin Sodium     cramps    Medications Reviewed Today     Reviewed by Angela Pheasant, MD (Physician) on 03/15/21 at 2043  Med List Status: <None>   Medication Order Taking? Sig Documenting Provider Last Dose Status Informant  amLODipine (NORVASC) 5 MG tablet [id number] No TAKE 1 TABLET BY MOUTH  TWICE DAILY Angela Pheasant, MD Taking Active Child  Cholecalciferol (VITAMIN D) 2000 units tablet [id number] No Take 2,000 Units by mouth daily.  [provider] Taking Active Child  clopidogrel (PLAVIX) 75 MG tablet [id number] No  TAKE 1 TABLET(75 MG) BY MOUTH DAILY Angela Pheasant, MD Taking Active Child  empagliflozin (JARDIANCE) 25 MG TABS tablet [id number]  Take 1 tablet (25 mg total) by mouth daily. Angela Pheasant, MD  Active   glucose blood test strip [id number] No OneTouch Ultra Test strips [provider] Taking Active Child  glucose blood test strip [id number]  USE TO CHECK BLOOD SUGAR  TWO TIMES DAILY. Dx E11.9 Angela Pheasant, MD  Active   Lancet Devices (ONE TOUCH DELICA LANCING DEV) [place] [id number] No Use twice daily Dx: 250.00 Angela Pheasant, MD Taking Active Child  Multiple Vitamin (MULTIVITAMIN) tablet [id number] No Take 1 tablet by mouth daily. [provider] Taking Active   OMEGA-3 FATTY ACIDS PO [id number] No Take 720 mg by mouth daily. [provider] Taking Active Child  rosuvastatin (CRESTOR) 5 MG tablet [id number]  TAKE 1 TABLET(5 MG) BY MOUTH 2 TIMES A WEEK Scott, Charlene, MD  Active   sertraline (ZOLOFT) 25 MG tablet [id number]  Take 1-2 tablets (25-50 mg total) by mouth daily. Angela Pheasant, MD  Active   timolol (BETIMOL) 0.5 % ophthalmic solution [id number] No 1 drop daily. [provider] Taking Active Child            Patient Active Problem List   Diagnosis Date Noted   Weakness 10/27/2020   Lobar pneumonia ([place])    Lactic acidosis    Acute metabolic encephalopathy    CAP (community acquired pneumonia) 10/18/2020   Cough 09/15/2020   Finger lesion 06/13/2020   Nasal lesion 06/13/2020   Change in hearing 12/18/2019   Finger laceration 02/21/2019   Gait abnormality 11/13/2018   Falls 08/28/2018   Anxiety 06/22/2018   Diarrhea 12/13/2017   Rectal bleeding 07/07/2017   Foot ulcer ([place]) 04/18/2017   Open wound of foot excluding toes without complication 27/74/1287   Health care maintenance 10/27/2014   Weight loss 12/27/2013   Thrombocytopenia ([place]) 11/20/2012   Hypertension 07/21/2012   Hypercholesterolemia 07/21/2012   History of CVA  (cerebrovascular accident) 07/21/2012   Diabetes mellitus ([place]) 07/21/2012   Essential hypertension 07/21/2012   Pure hypercholesterolemia 07/21/2012   Type 2 diabetes mellitus ([place]) 07/21/2012    Immunization History  Administered Date(s) Administered   DTaP 03/21/2012   Fluad Quad(high Dose 65+) 04/19/2019, 06/12/2020   Influenza Split 03/21/2012, 04/03/2014   Influenza, High Dose Seasonal PF 04/30/2017, 05/02/2018   Influenza-Unspecified 04/09/2015, 04/14/2016, 04/28/2017   PFIZER(Purple Top)SARS-COV-2 Vaccination 08/10/2019, 08/31/2019   Pneumococcal Conjugate-13 07/09/2014   Pneumococcal Polysaccharide-23 07/09/2012   Tdap 02/16/2019    Conditions to be addressed/monitored: DMII  Care Plan : Medication Management  Updates made by De Hollingshead, RPH-CPP since 04/22/2021 12:00 AM     Problem: Diabetes, hx CVA      Long-Range Goal: Disease Progression Prevention   Start Date: 11/15/2020  Recent Progress: On track  Priority: High  Note:   Current Barriers:  Unable to independently afford treatment regimen Unable to achieve control of diabetes   Pharmacist Clinical Goal(s):  Over the next 90 days, patient will verbalize ability to afford treatment regimen Over the next 90 days, patient will achieve control of diabetes as evidenced by A1c through collaboration with PharmD and provider.   Interventions: 1:1 collaboration with Angela Pheasant, MD regarding development and update of comprehensive plan of care as evidenced by provider attestation and co-signature Inter-disciplinary care team collaboration (see longitudinal plan of care) Comprehensive medication review performed; medication list updated in electronic medical record  SDOH: Worked with Palliative. They noted that she qualifies for Hospice. Palliative Care PA is coming out later this month. Still able to dress herself every morning, fix breakfast. Daughter comes over every day 10-10:30. Doing word search  every morning.   Diabetes: Uncontrolled, but more relaxed goal of <8% with no side effects is likely appropriate; current treatment: Jardiance 25 mg daily Per daughter, no mention of any side effects from SGLT2 therapy. Patient stays adequately hydrated.  Approved for Jardiance assistance through 07/19/21 Hx metformin therapy. Discontinued at recent hospitalization due to lactic acidosis Current glucose readings: fasting 170-180; post prandial 215-280;  Reviewed refill procedure.  Recommended to continue current regimen at this time. Will plan to reapply for assistance in December.   Hypertension: Controlled at more relaxed goal of ~150/90; current treatment: amlodipine 5 mg BID  Home readings: not checking recently, but they plan to start checking periodically.  Recommended to continue current regimen. Encouraged periodic home BP checks.   Hyperlipidemia/ secondary ASCVD prevention Appropriately managed; current treatment: rosuvastatin 5 mg 2 days weekly (Wednesday and Saturday) Antiplatelet therapy: clopidogrel 75 mg daily (hx CVA)  Recommended to continue current regimen.  Depression Appropriately managed per last report to PCP; current treatment: sertraline 50 mg daily (2 25 mg tabs) Previously advised [name] that the script could be changed to a 50 mg tablet strength in the future.  Recommended to continue current regimen.  Supplements: Vitamin D, omega 3 fatty acids, multivitamin  Patient Goals/Self-Care Activities Over the next 90 days, patient will:  - take medications as prescribed check glucose 2-3 times weekly, document, and provide at future appointments collaborate with provider on medication access solutions  Follow Up Plan: Telephone follow up appointment with care management team member scheduled for: ~12 weeks     Medication Assistance:  Jardiance obtained through [facility] medication assistance program.  Enrollment ends 07/19/21  Patient's preferred pharmacy  is:  [facility] [place], [address] [place] [address] Phone: [phone] Fax: [phone]  [facility]  ([place], [place] [facility] [address] [address] Phone: [phone] Fax: [phone]  [place], [place] - [address], [address] [place], [place] [address] Phone: [phone] Fax: [phone]    Follow Up:  Patient agrees to Care Plan and Follow-up.  Plan: Telephone follow up appointment with care management team member scheduled for:  ~12 weeks  Catie Darnelle Maffucci, PharmD, [place], [place] Clinical Pharmacist [employer] [facility] [employer] [phone]

## 2021-04-22 NOTE — Patient Instructions (Signed)
Visit Information  PATIENT GOALS:  Goals Addressed               This Visit's Progress     Patient Stated     Medication Monitoring (pt-stated)        Patient Goals/Self-Care Activities Over the next 90 days, patient will:  - take medications as prescribed check glucose 2-3 times weekly, document, and provide at future appointments collaborate with provider on medication access solutions        Patient verbalizes understanding of instructions provided today and agrees to view in [place].   Plan: Telephone follow up appointment with care management team member scheduled for:  ~12 weeks  Catie Darnelle Maffucci, PharmD, [place], [place] Clinical Pharmacist [employer] at [employer] [phone]

## 2021-05-06 ENCOUNTER — Telehealth: Payer: Self-pay | Admitting: Internal Medicine

## 2021-05-06 ENCOUNTER — Encounter: Payer: Self-pay | Admitting: Internal Medicine

## 2021-05-06 ENCOUNTER — Other Ambulatory Visit: Payer: Self-pay

## 2021-05-06 ENCOUNTER — Other Ambulatory Visit: Payer: Medicare Other | Admitting: Nurse Practitioner

## 2021-05-06 ENCOUNTER — Encounter: Payer: Self-pay | Admitting: Nurse Practitioner

## 2021-05-06 DIAGNOSIS — R630 Anorexia: Secondary | ICD-10-CM

## 2021-05-06 DIAGNOSIS — R413 Other amnesia: Secondary | ICD-10-CM

## 2021-05-06 DIAGNOSIS — I70245 Atherosclerosis of native arteries of left leg with ulceration of other part of foot: Secondary | ICD-10-CM | POA: Diagnosis not present

## 2021-05-06 DIAGNOSIS — Z515 Encounter for palliative care: Secondary | ICD-10-CM | POA: Diagnosis not present

## 2021-05-06 NOTE — Telephone Encounter (Signed)
Latoya from [facility] called in stating that the patients family would like to start hospice care and to see if Dr.Scott will serve as the attending provider for the patient.She would also like for an order to be sent over for hospice care.Please call her at [phone].

## 2021-05-06 NOTE — Progress Notes (Signed)
[facility] Palliative Care Consult Note Telephone: [phone]  Fax: [phone]    Date of encounter: 05/06/21 7:39 PM PATIENT NAME: Angela Patterson [place] [id number]   [id number] (home)  DOB: 09-12-1920 MRN: [id number] PRIMARY CARE PROVIDER:    Einar Pheasant, MD,  [address] [place] [address] [place] [id number] [phone]  RESPONSIBLE PARTY:    Contact Information     Name Relation Home Work Mobile   Marcell Barlow Daughter [phone]      I met face to face with patient and family in home. Palliative Care was asked to follow this patient by consultation request of  Einar Pheasant, MD to address advance care planning and complex medical decision making. This is a follow up visit.                                   ASSESSMENT AND PLAN / RECOMMENDATIONS: Symptom Management/Plan: ACP: DNR completed and MOST form completed placed in vynca to say DNR/Limited treatment options/no feeding tube; wishes are for IVF/Antiobiotics   2. Anorexia, improving per [name]; discussed nutrition, supplements, continue to measure, monitor weights; weight 104.6 lbs Right arm 8 inches;  Right leg 12.5 inches   3 Palliative care encounter; Palliative care encounter; Palliative medicine team will continue to support patient, patient's family, and medical team. Visit consisted of counseling and education dealing with the complex and emotionally intense issues of symptom management and palliative care in the setting of serious and potentially life-threatening illness I spent 62 minutes providing this consultation. More than 50% of the time in this consultation was spent in counseling and care coordination.  PPS: 40%  HOSPICE ELIGIBILITY/DIAGNOSIS: TBD  Chief Complaint: Follow up palliative consult for complex medical decision making  HISTORY OF PRESENT ILLNESS:  Angela Patterson is a 87 y.o. year old female   with multiple medical problems including h/o CVA,(nonhemorrhagic), TIA, DM, HTN, HLD, glaucoma, allergies, anxiety. Hospitalized 10/18/2020 to 10/19/2020 for right lower lobar pna, lactic acidosis secondary to metformin, acute metabolic encephalopathy with slurring words with CT brain negative. I called [name] to confirm pc visit, covid negative. I visited Angela Patterson, daughter [name], granddaughter in Angela Patterson home. Angela Patterson was sitting in the chair in her living home. We talked about how Angela Patterson has been feeling. Angela. [place] endorses she is doing well. We talked about symptoms including shortness of breath which Angela. [place] continues to experience. We talked about worsening with fatigue, weakness. Angela. Abplanalp walks with walker, no recent falls. We talked about intermit worsening memory, forgetful, getting things confused. We talked about appetite which has been good, weight stable. We talked about supplement. We talked about worsening dysphagia with eating, drinking. We talked about supplements. We talked about sleep pattern, Angela. [place] sleeps at night, also naps during the day. We talked about medical goals. We talked about option of Hospice services through Medicare benefit as Angela Patterson has been determined eligible by Hospice Physicians, family was not ready at that time. We talked about what services are provided, Hospice philosophy. [name] in agreement to have Hospice services, family in agreement. Therapeutic listening, emotional support provided. Questions answered.    History obtained from review of EMR, discussion with daughter, [name], granddaughter and  Angela Patterson.  I reviewed available labs, medications, imaging, studies and related documents from the EMR.  Records reviewed and summarized above.  ROS  Full 14 system review of systems performed and negative with exception of: as per HPI.   Physical Exam: Constitutional: NAD General: frail appearing, thin pleasant female EYES: lids  intact ENMT: oral mucous membranes moist CV: S1S2, RRR, no LE edema Pulmonary: LCTA, no increased work of breathing, no cough, room air Abdomen: normo-active BS + 4 quadrants, soft and non tender MSK: ambulatory with walker; muscle wasting Skin: warm and dry Neuro:  no generalized weakness,  +mild cognitive impairment Psych: non-anxious affect, A and O x 2  Questions and concerns were addressed. The patient/family was encouraged to call with questions and/or concerns. My contact information was provided. Provided general support and encouragement, no other unmet needs identified   Thank you for the opportunity to participate in the care of Angela Patterson.  The palliative care team will continue to follow. Please call our office at [phone] if we can be of additional assistance.   This chart was dictated using voice recognition software.  Despite best efforts to proofread,  errors can occur which can change the documentation meaning.   Arden Tinoco Z Mizael Sagar, NP   COVID-19 PATIENT SCREENING TOOL Asked and negative response unless otherwise noted:   Have you had symptoms of covid, tested positive or been in contact with someone with symptoms/positive test in the past 5-10 days? NO

## 2021-05-07 NOTE — Telephone Encounter (Signed)
Order placed for hospice referral.  

## 2021-05-07 NOTE — Telephone Encounter (Signed)
Spoke with [employer]. Stated that you agreed to be the attending but just needs order for hospice.

## 2021-05-09 ENCOUNTER — Other Ambulatory Visit: Payer: Self-pay

## 2021-05-09 ENCOUNTER — Ambulatory Visit (INDEPENDENT_AMBULATORY_CARE_PROVIDER_SITE_OTHER): Payer: Medicare Other | Admitting: Podiatry

## 2021-05-09 ENCOUNTER — Ambulatory Visit (INDEPENDENT_AMBULATORY_CARE_PROVIDER_SITE_OTHER): Payer: Medicare Other

## 2021-05-09 DIAGNOSIS — Z23 Encounter for immunization: Secondary | ICD-10-CM

## 2021-05-09 DIAGNOSIS — M79674 Pain in right toe(s): Secondary | ICD-10-CM | POA: Diagnosis not present

## 2021-05-09 DIAGNOSIS — B351 Tinea unguium: Secondary | ICD-10-CM

## 2021-05-09 DIAGNOSIS — M79675 Pain in left toe(s): Secondary | ICD-10-CM

## 2021-05-09 DIAGNOSIS — L989 Disorder of the skin and subcutaneous tissue, unspecified: Secondary | ICD-10-CM

## 2021-05-12 NOTE — Telephone Encounter (Signed)
[name] from [facility] would like for Dr.Scott to know that they evaluated the patient today for Hospice Care and their physician doesn't feel she is appropriate for Hospice and she will remain on the pallative care list.

## 2021-05-13 NOTE — Telephone Encounter (Signed)
FYI for you. Pt will continue with palliative care.

## 2021-05-17 ENCOUNTER — Encounter: Payer: Self-pay | Admitting: Internal Medicine

## 2021-05-19 ENCOUNTER — Other Ambulatory Visit: Payer: Medicare Other

## 2021-05-19 ENCOUNTER — Telehealth: Payer: Self-pay | Admitting: Internal Medicine

## 2021-05-19 ENCOUNTER — Other Ambulatory Visit: Payer: Self-pay

## 2021-05-19 DIAGNOSIS — L97509 Non-pressure chronic ulcer of other part of unspecified foot with unspecified severity: Secondary | ICD-10-CM | POA: Diagnosis not present

## 2021-05-19 DIAGNOSIS — E7800 Pure hypercholesterolemia, unspecified: Secondary | ICD-10-CM

## 2021-05-19 DIAGNOSIS — R8290 Unspecified abnormal findings in urine: Secondary | ICD-10-CM

## 2021-05-19 DIAGNOSIS — E11621 Type 2 diabetes mellitus with foot ulcer: Secondary | ICD-10-CM

## 2021-05-19 NOTE — Addendum Note (Signed)
Addended by: Leeanne Rio on: 05/19/2021 02:48 PM   Modules accepted: Orders

## 2021-05-19 NOTE — Telephone Encounter (Signed)
FYI I spoke with Angela Patterson, patient's daughter. She stated that her mom has been a little more disoriented & unsteady on her feet. She was unsure if that was due to age or if this was UTI symptoms. Angela Patterson stated that her mom's urine has smelt very foul, but after AZO as well as probiotic she thought it was less strong. Pt has not complained of anything or pain. Since this was the case & AZO mainly beneficial for the pain I advised that she not give patient today so that UA & culture be more clear. Pt's daughter stated that she would hold off on the AZO. Angela Patterson will pick up hat, cup, wipes today & bring back patient's urine sample. I also advised that if she could help clean her mom with wipes provided to make sure that sample was not contaminated would be great. She is scheduled for VV tomorrow with Joycelyn Schmid at 9:30.

## 2021-05-19 NOTE — Telephone Encounter (Signed)
Added to telephone call and routed to CMA/Nurse for triage

## 2021-05-19 NOTE — Telephone Encounter (Signed)
Angela Patterson M  P [facility] (supporting Einar Pheasant, MD) 2 days ago   Hi Dr. Nicki Reaper,  we believe [facility] has a UTI .  Symptoms are: extreme potent smell of urine, unsteady on her feet, some confusion.   She is drinking lots of water, going to the bathroom more often, taking probiotics. Is there anyway that you could call her in a prescription for antibiotics?  If you have questions or need to, please contact [name] directly to discuss [phone].   Thank you! Russell

## 2021-05-20 ENCOUNTER — Encounter: Payer: Self-pay | Admitting: Family

## 2021-05-20 ENCOUNTER — Telehealth (INDEPENDENT_AMBULATORY_CARE_PROVIDER_SITE_OTHER): Payer: Medicare Other | Admitting: Family

## 2021-05-20 DIAGNOSIS — I70245 Atherosclerosis of native arteries of left leg with ulceration of other part of foot: Secondary | ICD-10-CM

## 2021-05-20 DIAGNOSIS — R8290 Unspecified abnormal findings in urine: Secondary | ICD-10-CM

## 2021-05-20 NOTE — Assessment & Plan Note (Signed)
Symptom resolved at this time.  Patient is afebrile with no changes in mentation.  Daughter is primary historian and denies any further concerns today.  She feels comfortable with waiting for urine culture to return.  At this time urinalysis is negative.  Will await culture and asked daughter to call me back with any further concerns.

## 2021-05-20 NOTE — Telephone Encounter (Signed)
Patient is seeing NP

## 2021-05-20 NOTE — Progress Notes (Signed)
Virtual Visit via Video Note  I connected with@  on 05/20/21 at  9:30 AM EDT by a video enabled telemedicine application and verified that I am speaking with the correct person using two identifiers.  Location patient: home Location provider:work  Persons participating in the virtual visit: patient, provider  I discussed the limitations of evaluation and management by telemedicine and the availability of in person appointments. The patient expressed understanding and agreed to proceed.   HPI: Accompanied by daughter, who is primary historian.  Mother is hard of hearing.  Reason for visit was concern for urinary tract infection. Urine had had foul odor 4 days ago  this has since resolved.  Daughter is less concerned at this time as she feels like symptoms have improved.  She went to urinary office yesterday No flank , frequency, dysuria, abdominal pain, hematuria, fever, constipation, confusion. Drinking plenty of water.   She wears pull ups.  She is incontinent of stool and urine.  Last UTI 10/2020.  H/o CKD.        ROS: See pertinent positives and negatives per HPI.    EXAM:  VITALS per patient if applicable: Ht 5\' 2"  (1.575 m)   LMP 07/21/1966   BMI 18.84 kg/m  BP Readings from Last 3 Encounters:  10/19/20 140/75  06/12/20 138/70  02/20/19 138/70   Wt Readings from Last 3 Encounters:  03/14/21 103 lb (46.7 kg)  10/23/20 102 lb (46.3 kg)  10/18/20 103 lb 9.9 oz (47 kg)    GENERAL: alert, oriented, appears well and in no acute distress  HEENT: atraumatic, conjunttiva clear, no obvious abnormalities on inspection of external nose and ears  NECK: normal movements of the head and neck  LUNGS: on inspection no signs of respiratory distress, breathing rate appears normal, no obvious gross SOB, gasping or wheezing  CV: no obvious cyanosis  MS: moves all visible extremities without noticeable abnormality  PSYCH/NEURO: pleasant and cooperative, no obvious  depression or anxiety, speech and thought processing grossly intact  ASSESSMENT AND PLAN:  Discussed the following assessment and plan:  Problem List Items Addressed This Visit       Other   Abnormal urine odor    Symptom resolved at this time.  Patient is afebrile with no changes in mentation.  Daughter is primary historian and denies any further concerns today.  She feels comfortable with waiting for urine culture to return.  At this time urinalysis is negative.  Will await culture and asked daughter to call me back with any further concerns.       -we discussed possible serious and likely etiologies, options for evaluation and workup, limitations of telemedicine visit vs in person visit, treatment, treatment risks and precautions. Pt prefers to treat via telemedicine empirically rather then risking or undertaking an in person visit at this moment.  .   I discussed the assessment and treatment plan with the patient. The patient was provided an opportunity to ask questions and all were answered. The patient agreed with the plan and demonstrated an understanding of the instructions.   The patient was advised to call back or seek an in-person evaluation if the symptoms worsen or if the condition fails to improve as anticipated.   Mable Paris, FNP

## 2021-05-20 NOTE — Telephone Encounter (Signed)
Can work her in at 4:00 for virtual

## 2021-05-21 ENCOUNTER — Telehealth: Payer: Self-pay

## 2021-05-21 ENCOUNTER — Encounter: Payer: Self-pay | Admitting: Internal Medicine

## 2021-05-21 NOTE — Telephone Encounter (Signed)
420 pm.  Request received from [place], NP to schedule follow up visit in 2-4 weeks.  Phone call made to Angela Patterson and visit is scheduled for November 16 at 10 am.

## 2021-05-23 ENCOUNTER — Telehealth: Payer: Self-pay | Admitting: Nurse Practitioner

## 2021-05-23 LAB — URINALYSIS, ROUTINE W REFLEX MICROSCOPIC
Bilirubin, UA: NEGATIVE
Ketones, UA: NEGATIVE
Leukocytes,UA: NEGATIVE
Nitrite, UA: NEGATIVE
Protein,UA: NEGATIVE
RBC, UA: NEGATIVE
Specific Gravity, UA: 1.026 (ref 1.005–1.030)
Urobilinogen, Ur: 0.2 mg/dL (ref 0.2–1.0)
pH, UA: 6.5 (ref 5.0–7.5)

## 2021-05-23 LAB — URINE CULTURE

## 2021-05-23 NOTE — Telephone Encounter (Signed)
Spoke with patient's daughter [name], regarding schedule a Palliative f/u visit with NP and this was scheduled for 07/30/21 @ 11 AM.

## 2021-05-25 NOTE — Progress Notes (Signed)
   SUBJECTIVE Patient presents to office today complaining of elongated, thickened nails that cause pain while ambulating in shoes.  Patient is unable to trim their own nails. Patient is here for further evaluation and treatment.  Past Medical History:  Diagnosis Date   Allergy    Chicken pox    CVA (cerebral vascular accident) ([place])    Diabetes mellitus ([place])    Hypercholesterolemia    Hypertension     OBJECTIVE General Patient is awake, alert, and oriented x 3 and in no acute distress. Derm Skin is dry and supple bilateral. Negative open lesions or macerations. Remaining integument unremarkable. Nails are tender, long, thickened and dystrophic with subungual debris, consistent with onychomycosis, 1-5 bilateral. No signs of infection noted.  Hyperkeratotic preulcerative callus tissue was also noted to the plantar aspect of the first MTP joint of the left foot.  Patient has history of recurrent ulcer to this area although today it is doing well and it is currently healed.  No open wound noted Vasc  DP and PT pedal pulses palpable bilaterally. Temperature gradient within normal limits.  Neuro Epicritic and protective threshold sensation grossly intact bilaterally.  Musculoskeletal Exam No symptomatic pedal deformities noted bilateral. Muscular strength within normal limits.  ASSESSMENT 1.  Pain due to onychomycosis of toenails both 2.  Preulcerative callus lesion left plantar first MTP joint  PLAN OF CARE 1. Patient evaluated today.  2. Instructed to maintain good pedal hygiene and foot care.  3. Mechanical debridement of nails 1-5 bilaterally performed using a nail nipper. Filed with dremel without incident.  4.  Excisional debridement of the hyperkeratotic callus tissue was performed using a 312 scalpel without incident or bleeding  5.  Return to clinic in 3 mos.    [name], DPM [facility]  Dr. [name], DPM    [address], [place] [address]                Office [phone]  Fax [phone]

## 2021-06-04 ENCOUNTER — Encounter: Payer: Self-pay | Admitting: Nurse Practitioner

## 2021-06-04 ENCOUNTER — Encounter: Payer: Self-pay | Admitting: Internal Medicine

## 2021-06-04 ENCOUNTER — Other Ambulatory Visit: Payer: Medicare Other | Admitting: Nurse Practitioner

## 2021-06-04 ENCOUNTER — Other Ambulatory Visit: Payer: Self-pay

## 2021-06-04 VITALS — BP 146/62 | HR 64 | Temp 98.4°F | Wt 107.2 lb

## 2021-06-04 DIAGNOSIS — S81801A Unspecified open wound, right lower leg, initial encounter: Secondary | ICD-10-CM

## 2021-06-04 DIAGNOSIS — R630 Anorexia: Secondary | ICD-10-CM

## 2021-06-04 DIAGNOSIS — I639 Cerebral infarction, unspecified: Secondary | ICD-10-CM | POA: Diagnosis not present

## 2021-06-04 DIAGNOSIS — Z515 Encounter for palliative care: Secondary | ICD-10-CM

## 2021-06-04 NOTE — Addendum Note (Signed)
Addended by: Shawn Stall on: 06/04/2021 03:10 PM   Modules accepted: Level of Service

## 2021-06-04 NOTE — Telephone Encounter (Signed)
See attached.  For work in tomorrow if ok to wait until tomorrow.

## 2021-06-04 NOTE — Progress Notes (Signed)
[facility] Consult Note Telephone: [phone]  Fax: [phone]    Date of encounter: 06/04/21 10:54 AM PATIENT NAME: [place] [address]   [id number] (home)  DOB: [birth year] MRN: [id number] PRIMARY CARE PROVIDER:    Einar Pheasant, MD,  [address] [place] [address] [phone]  RESPONSIBLE PARTY:    Contact Information     Name Relation Home Work [place] E Daughter [phone]      Due to the COVID-19 crisis, this visit was done via telemedicine from my office and it was initiated and consent by this patient and or family.  I connected with Vance Gather RN Palliative ACC with [name], [name] daughter and  NARIA ABBEY OR PROXY on 06/04/21 by a telephone as video not available enabled telemedicine application and verified that I am speaking with the correct person using two identifiers.   I discussed the limitations of evaluation and management by telemedicine. The patient expressed understanding and agreed to proceed.   I met face to face with patient and family in the home via virtual visit.  Palliative Care was asked to follow this patient by consultation request of  Einar Pheasant, MD to address advance care planning and complex medical decision making. This is a follow up visit ASSESSMENT AND PLAN / RECOMMENDATIONS:  Symptom Management/Plan: ACP:  Reviewed MOST and DNR form.  Daughter [name] confirms wishes and desire to to keep patient home.  2.  Anorexia:  Improving.  2.6 lb weight gain in the last 3 weeks.  Ms. Albino endorses a good appetite and continues to have family support with meals and Meals on Wheels. Will continue to monitor with re-visit hospice should >10% loss  3.  Wound to right lower leg secondary to Fall:  Ms. Vanderzanden sustained a fall in her bathroom about 3 weeks ago.  She reports her legs  "gave out" and she fell into the bathtub.  Denies loss of consciousness.  Sustained a laceration to her right posterior lower leg.  Theresa-daughter has been providing wound care qod.   Area is cleansed with a wound cleaner and vaseline dressing is applied with cloth tape.  Edges of the wound bed are white, wound bed with some granulating tissue present.  Redness noted to area surrounding the wound but no warmth noted. With clinical assessment, discussed with [name], recommended to go to ED or schedule visit with Dr Nicki Reaper today as per viewing wound appears possible cellulitis which may need IV anabiotics. Discussed at length with [name]. [name] in agreement to calling Dr Bary Leriche office for appointment  4. Dysphagia secondary to CVA:  Ms. Picco and [name] endorse this has progressive worsened over the last month.  Increase is coughing reported and witnessed on this visit.  Excessive oral secretions also noted.  Patient utilizing tissues to remove excessive secretions from her mouth.   Follow up Palliative Care Visit: Palliative care will continue to follow for complex medical decision making, advance care planning, and clarification of goals. Return in 4- 8 weeks or prn.  I spent 27 minutes providing this consultation. More than 50% of the time in this consultation was spent in counseling and care coordination.  PPS: 50%  HOSPICE ELIGIBILITY/DIAGNOSIS: TBD  Chief Complaint: Follow up palliative consult for complex medical decision making.  HISTORY OF PRESENT ILLNESS:  TOM RAGSDALE is a 87 y.o. year old female  with multiple  medical problems including h/o CVA,(nonhemorrhagic), TIA, DM, HTN, HLD, glaucoma, allergies, anxiety.  Ms. Humphres is being followed by Palliative Care every 4-8 weeks and PRN.   I visit with [name] and her daughter [name] in the home and connected Natalia Leatherwood, NP telephonically by conference call with [name], [name] daughter and Ms. Green.  Ms. Briere was seen by  [facility] last month was not admitted to services due to stability with weight gain.  Ms. Montero endorses a fall in the bathroom about 3 weeks ago. Since Ms. Hesch sustained a laceration to her right posterior leg measuring 2 x 0.5 inches.  Ms. Fillingim denies loss of consciousness and endorses her legs "gave out" and she fell into the bathtub.  Area is concerning for infection.  Discussed options including recommended IV antibiotics to addressed the concerning wound.  Possible ED or Med Center visit would be required for IV antibiotics or same day primary provider visit.  [name] will follow up with PCP regarding treatment and appointment.  She has pictures of the leg wound after the fall and today and will send them to PCP through [facility].  [name] will also follow up with a phone call to PCP office.  Discussed anorexia is improving and noted weight gain reported by [name] and [name].  Ms. Gilchrest is receiving Meals on Wheels and family is assisting with meal prep. Discussed Dysphagia continues to be an ongoing problem but has worsened over the last month.  Increase coughing and oral secretions are reported.  Ms. Tones is utilizing tissues to remove secretions. Discussed role pc in poc, f/u visit. Emotional support provided.   History obtained from review of EMR, discussion with primary team, and interview with family, facility staff/caregiver and/or [name].  I reviewed available labs, medications, imaging, studies and related documents from the EMR.  Records reviewed and summarized above.   ROS General: NAD EYES: denies vision changes ENMT: + dysphagia, progressively worse over the last month Cardiovascular: denies chest pain, very mild DOE Pulmonary: + cough, denies increased SOB Abdomen: endorses good appetite, denies constipation, endorses continence of bowel GU: denies dysuria, endorses some incontinence of urine wearing depends. MSK:  denies weakness,  fall reported 3 weeks  ago-patient fell backwards into the bathtub sustained laceration to right posterior leg.  Skin: denies rashes, + right lower posterior leg laceration Neurological: denies pain, denies insomnia Psych: Endorses positive mood Heme/lymph/immuno: + bruises present to arms and legs, abnormal bleeding  Physical Exam: Current and past weights:107.2 lbs, 3 weeks ago weight was 104.6 lbs Constitutional: NAD General: frail appearing and thin  EYES: anicteric sclera, lids intact, no discharge  ENMT: + hearing loss, oral mucous membranes moist, dentition intact CV: RRR, no LE edema Pulmonary: LCTA, no increased work of breathing, + cough, room air Abdomen: intake 75-100%, normo-active BS + 4 quadrants, soft and non tender MSK: + sarcopenia, moves all extremities, ambulatory with a rolling walker Skin: warm and dry, no rashes, + wound to right posterior leg; laceration to her right posterior leg measuring 2 x 0.5 inches Neuro:  + generalized weakness,  + cognitive impairment Psych: non-anxious affect, A and O x 2 Exam per Vance Gather RN; reviewed   Thank you for the opportunity to participate in the care of [name].  The palliative care team will continue to follow. Please call our office at [phone] if we can be of additional assistance.   Questions and concerns were addressed. The patient/family was encouraged to call  with questions and/or concerns. Provided general support and encouragement, no other unmet needs identified   Lorenza Burton, RN   COVID-19 PATIENT SCREENING TOOL Asked and negative response unless otherwise noted:   Have you had symptoms of covid, tested positive or been in contact with someone with symptoms/positive test in the past 5-10 days?  No

## 2021-06-04 NOTE — Telephone Encounter (Signed)
I am unable to confirm (by the pictures) if IV antibiotics are needed (of if oral abx are adequate).  If she is doing ok and does not need something more urgent today, I can work her in tomorrow for evaluation.  Make sure aware that if IV abx are needed, may have to send to [facility].

## 2021-06-04 NOTE — Telephone Encounter (Signed)
Confirmed doing ok. Nothing acute at this time. No acute symptoms. Daughter stated she is acting like her normal self. Patient has been scheduled for tomorrow at 1:30.

## 2021-06-04 NOTE — Telephone Encounter (Signed)
Pt daughter Janalyn Shy called in stating that she sent over a Mychart message about Pt fall that she had a few weeks ago. Pt daughter stated if someone can give her a callback in regards to her mothers fall. Pt callback number is [phone]

## 2021-06-05 ENCOUNTER — Ambulatory Visit (INDEPENDENT_AMBULATORY_CARE_PROVIDER_SITE_OTHER): Payer: Medicare Other | Admitting: Internal Medicine

## 2021-06-05 ENCOUNTER — Other Ambulatory Visit: Payer: Self-pay

## 2021-06-05 DIAGNOSIS — S81801A Unspecified open wound, right lower leg, initial encounter: Secondary | ICD-10-CM | POA: Diagnosis not present

## 2021-06-05 DIAGNOSIS — E11621 Type 2 diabetes mellitus with foot ulcer: Secondary | ICD-10-CM | POA: Diagnosis not present

## 2021-06-05 DIAGNOSIS — I70245 Atherosclerosis of native arteries of left leg with ulceration of other part of foot: Secondary | ICD-10-CM

## 2021-06-05 DIAGNOSIS — D696 Thrombocytopenia, unspecified: Secondary | ICD-10-CM | POA: Diagnosis not present

## 2021-06-05 DIAGNOSIS — I10 Essential (primary) hypertension: Secondary | ICD-10-CM | POA: Diagnosis not present

## 2021-06-05 DIAGNOSIS — L97509 Non-pressure chronic ulcer of other part of unspecified foot with unspecified severity: Secondary | ICD-10-CM

## 2021-06-05 NOTE — Progress Notes (Signed)
Patient ID: Angela Patterson, female   DOB: 08/20/1920, 87 y.o.   MRN: [id number]   Subjective:    Patient ID: Angela Patterson, female    DOB: [birth year], 87 y.o.   MRN: [id number]  This visit occurred during the SARS-CoV-2 public health emergency.  Safety protocols were in place, including screening questions prior to the visit, additional usage of staff PPE, and extensive cleaning of exam room while observing appropriate contact time as indicated for disinfecting solutions.   Patient here for work in appt.    Chief Complaint  Patient presents with   skin tear   .   HPI [place] three weeks ago.  Has skin tear/wound - right posterior leg.  They have been cleaning and applying vaseline.  Keeping covered.  Palliative care evaluated and was questioning need for abx.  No surrounding infection. No pain.  She is eating.  Breathing stable.  No increased cough or congestion.     Past Medical History:  Diagnosis Date   Allergy    Chicken pox    CVA (cerebral vascular accident) ([place])    Diabetes mellitus ([place])    Hypercholesterolemia    Hypertension    Past Surgical History:  Procedure Laterality Date   ABDOMINAL HYSTERECTOMY  1971   excessive bleeding   APPENDECTOMY  age 42   BREAST LUMPECTOMY  1958   benign   DILATION AND CURETTAGE OF UTERUS  1970   Family History  Problem Relation Age of Onset   Liver cancer Father    Stroke Mother    Heart attack Brother    Hypertension Daughter    Hypertension Son    Hypertension Daughter    Cancer Grandchild        breast   Diabetes Grandchild    Breast cancer Neg Hx    Colon cancer Neg Hx    Social History   Socioeconomic History   Marital status: Widowed    Spouse name: Not on file   Number of children: 3   Years of education: Not on file   Highest education level: Not on file  Occupational History   Not on file  Tobacco Use   Smoking status: Never   Smokeless tobacco: Never  Substance and Sexual Activity   Alcohol use: No     Alcohol/week: 0.0 standard drinks   Drug use: No   Sexual activity: Not on file  Other Topics Concern   Not on file  Social History Narrative   Not on file   Social Determinants of Health   Financial Resource Strain: Medium Risk   Difficulty of Paying Living Expenses: Somewhat hard  Food Insecurity: No Food Insecurity   Worried About Running Out of Food in the Last Year: Never true   Ran Out of Food in the Last Year: Never true  Transportation Needs: No Transportation Needs   Lack of Transportation (Medical): No   Lack of Transportation (Non-Medical): No  Physical Activity: Insufficiently Active   Days of Exercise per Week: 7 days   Minutes of Exercise per Session: 20 min  Stress: No Stress Concern Present   Feeling of Stress : Not at all  Social Connections: Unknown   Frequency of Communication with Friends and Family: Not on file   Frequency of Social Gatherings with Friends and Family: More than three times a week   Attends Religious Services: Not on file   Active Member of Clubs or Organizations: Not on file   Attends Archivist  Meetings: Not on file   Marital Status: Not on file     Review of Systems  Constitutional:  Negative for appetite change and unexpected weight change.  HENT:  Negative for congestion and sinus pressure.   Respiratory:  Negative for cough, chest tightness and shortness of breath.   Cardiovascular:  Negative for chest pain, palpitations and leg swelling.  Gastrointestinal:  Negative for constipation, diarrhea, nausea and vomiting.  Genitourinary:  Negative for difficulty urinating and dysuria.  Musculoskeletal:  Negative for joint swelling and myalgias.  Skin:        Skin tear/wound - right posterior leg.  No surrounding erythema.    Neurological:  Negative for dizziness, light-headedness and headaches.  Psychiatric/Behavioral:  Negative for agitation and dysphoric mood.       Objective:     BP 128/70   Pulse 67   Temp 97.6 F  (36.4 C)   Resp 16   Wt 107 lb (48.5 kg)   LMP 07/21/1966   SpO2 97%   BMI 19.57 kg/m  Wt Readings from Last 3 Encounters:  06/05/21 107 lb (48.5 kg)  06/04/21 107 lb 3.2 oz (48.6 kg)  03/14/21 103 lb (46.7 kg)    Physical Exam Vitals reviewed.  Constitutional:      General: She is not in acute distress.    Appearance: Normal appearance.  HENT:     Head: Normocephalic and atraumatic.     Right Ear: External ear normal.     Left Ear: External ear normal.  Eyes:     General: No scleral icterus.       Right eye: No discharge.        Left eye: No discharge.     Conjunctiva/sclera: Conjunctivae normal.  Neck:     Thyroid: No thyromegaly.  Cardiovascular:     Rate and Rhythm: Normal rate and regular rhythm.  Pulmonary:     Effort: No respiratory distress.     Breath sounds: Normal breath sounds. No wheezing.  Abdominal:     General: Bowel sounds are normal.     Palpations: Abdomen is soft.     Tenderness: There is no abdominal tenderness.  Musculoskeletal:     Cervical back: Neck supple. No tenderness.     Comments: Open wound - right posterior leg.  No surrounding erythema or increased tenderness.    Lymphadenopathy:     Cervical: No cervical adenopathy.  Skin:    Findings: No erythema or rash.  Neurological:     Mental Status: She is alert.  Psychiatric:        Mood and Affect: Mood normal.        Behavior: Behavior normal.     Outpatient Encounter Medications as of 06/05/2021  Medication Sig   amLODipine (NORVASC) 5 MG tablet TAKE 1 TABLET(5 MG) BY MOUTH TWICE DAILY   Cholecalciferol (VITAMIN D) 2000 units tablet Take 2,000 Units by mouth daily.    clopidogrel (PLAVIX) 75 MG tablet TAKE 1 TABLET(75 MG) BY MOUTH DAILY   empagliflozin (JARDIANCE) 25 MG TABS tablet Take 1 tablet (25 mg total) by mouth daily.   glucose blood test strip OneTouch Ultra Test strips   glucose blood test strip USE TO CHECK BLOOD SUGAR  TWO TIMES DAILY. Dx E11.9   Lancet Devices (ONE  TOUCH DELICA LANCING DEV) MISC Use twice daily Dx: 250.00   Multiple Vitamin (MULTIVITAMIN) tablet Take 1 tablet by mouth daily.   OMEGA-3 FATTY ACIDS PO Take 720 mg by mouth daily.  rosuvastatin (CRESTOR) 5 MG tablet TAKE 1 TABLET(5 MG) BY MOUTH 2 TIMES A WEEK   sertraline (ZOLOFT) 25 MG tablet Take 1-2 tablets (25-50 mg total) by mouth daily. (Patient taking differently: Take 25 mg by mouth daily.)   timolol (BETIMOL) 0.5 % ophthalmic solution 1 drop daily.   No facility-administered encounter medications on file as of 06/05/2021.     Lab Results  Component Value Date   WBC 7.0 12/20/2020   HGB 15.4 (H) 12/20/2020   HCT 45.8 12/20/2020   PLT 116.0 (L) 12/20/2020   GLUCOSE 180 (H) 12/20/2020   CHOL 138 12/20/2020   TRIG 128.0 12/20/2020   HDL 52.20 12/20/2020   LDLDIRECT 107.0 06/12/2020   LDLCALC 60 12/20/2020   ALT 51 (H) 12/20/2020   AST 55 (H) 12/20/2020   NA 139 12/20/2020   K 4.1 12/20/2020   CL 102 12/20/2020   CREATININE 0.76 12/20/2020   BUN 22 12/20/2020   CO2 26 12/20/2020   TSH 2.47 06/12/2020   HGBA1C 8.7 (H) 12/20/2020   MICROALBUR 1.6 12/20/2020    CT Head Wo Contrast  Result Date: 10/18/2020 CLINICAL DATA:  Encephalopathy EXAM: CT HEAD WITHOUT CONTRAST TECHNIQUE: Contiguous axial images were obtained from the base of the skull through the vertex without intravenous contrast. COMPARISON:  None. FINDINGS: Brain: There is no mass, hemorrhage or extra-axial collection. There is generalized atrophy without lobar predilection. Hypodensity of the white matter is most commonly associated with chronic microvascular disease. Old small vessel infarcts of the basal ganglia on the right. Vascular: Atherosclerotic calcification of the vertebral and internal carotid arteries at the skull base. No abnormal hyperdensity of the major intracranial arteries or dural venous sinuses. Skull: The visualized skull base, calvarium and extracranial soft tissues are normal. Sinuses/Orbits:  No fluid levels or advanced mucosal thickening of the visualized paranasal sinuses. No mastoid or middle ear effusion. The orbits are normal. IMPRESSION: Chronic small vessel disease and generalized volume loss without acute abnormality. Electronically Signed   By: Ulyses Jarred M.D.   On: 10/18/2020 22:03   DG Chest Portable 1 View  Result Date: 10/18/2020 CLINICAL DATA:  Altered mental status.  Evaluate for infiltrate. EXAM: PORTABLE CHEST 1 VIEW COMPARISON:  One-view chest x-ray 04/16/2017 FINDINGS: Heart is enlarged. Atherosclerotic changes are noted at the aortic arch. No edema or effusion is present. Asymmetric scratched at changes of COPD are again noted. Asymmetric right lower lobe airspace disease is present. IMPRESSION: 1. Asymmetric right lower lobe airspace disease concerning for pneumonia. 2. Stable changes of COPD. 3. Cardiomegaly without failure 4. Aortic atherosclerosis. Electronically Signed   By: San Morelle M.D.   On: 10/18/2020 20:28   DG Foot Complete Left  Result Date: 10/19/2020 CLINICAL DATA:  Healing wound under left great toe EXAM: LEFT FOOT - COMPLETE 3+ VIEW COMPARISON:  None. FINDINGS: No visible bone destruction to suggest acute osteomyelitis. No acute fracture, subluxation or dislocation. Soft tissues are intact. IMPRESSION: No acute bony abnormality. Electronically Signed   By: Rolm Baptise M.D.   On: 10/19/2020 00:16       Assessment & Plan:   Problem List Items Addressed This Visit     Hypertension    Blood pressure has been doing well on amlodipine.  Follow pressures.  Follow metabolic panel.       Thrombocytopenia (HCC)    Platelet count 116 last check.  Follow cbc.       Type 2 diabetes mellitus ([place])    On jardiance.  Follow sugars and send in readings.  Follow met b and a1c.       Wound of right leg    Skin tear/open wound right posterior leg.  No increased erythema.  No evidence of infection.  Stop vaseline.  Dressing changed and applied new  dressing.  Daughter instructed on how to dress wound.  Follow.  Call with update.          Einar Pheasant, MD

## 2021-06-13 ENCOUNTER — Encounter: Payer: Self-pay | Admitting: Internal Medicine

## 2021-06-13 DIAGNOSIS — S81801A Unspecified open wound, right lower leg, initial encounter: Secondary | ICD-10-CM | POA: Insufficient documentation

## 2021-06-13 NOTE — Assessment & Plan Note (Signed)
On jardiance.  Follow sugars and send in readings.  Follow met b and a1c.

## 2021-06-13 NOTE — Assessment & Plan Note (Signed)
Platelet count 116 last check.  Follow cbc.

## 2021-06-13 NOTE — Assessment & Plan Note (Signed)
Skin tear/open wound right posterior leg.  No increased erythema.  No evidence of infection.  Stop vaseline.  Dressing changed and applied new dressing.  Daughter instructed on how to dress wound.  Follow.  Call with update.

## 2021-06-13 NOTE — Assessment & Plan Note (Signed)
Blood pressure has been doing well on amlodipine.  Follow pressures.  Follow metabolic panel.

## 2021-06-14 ENCOUNTER — Encounter: Payer: Self-pay | Admitting: Internal Medicine

## 2021-06-15 NOTE — Telephone Encounter (Signed)
Please confirm how her wound is doing.

## 2021-06-17 NOTE — Telephone Encounter (Signed)
See if need wound care nurse to come out to house - has palliative care arranged already?  Also, can they get at medical supply store, etc?

## 2021-06-17 NOTE — Telephone Encounter (Signed)
Daughter stated that wound is healing but is healing slowly. Does not look worse. She has been changing every other day so it can get some air. Needing more of the big tegaderms so they can change it more often and let it get air. We do not have any of the big tegaderms to give them.

## 2021-06-18 NOTE — Telephone Encounter (Signed)
Angela Patterson is going to call palliative care and have someone come out. If they decide to use wound care or if palliative care is unable to come out, then she will let us know but daughter says the wound is continuing to look better.

## 2021-06-30 ENCOUNTER — Encounter: Payer: Self-pay | Admitting: Internal Medicine

## 2021-07-01 ENCOUNTER — Telehealth (INDEPENDENT_AMBULATORY_CARE_PROVIDER_SITE_OTHER): Payer: Medicare Other | Admitting: Internal Medicine

## 2021-07-01 DIAGNOSIS — E7800 Pure hypercholesterolemia, unspecified: Secondary | ICD-10-CM

## 2021-07-01 DIAGNOSIS — F419 Anxiety disorder, unspecified: Secondary | ICD-10-CM

## 2021-07-01 DIAGNOSIS — I70245 Atherosclerosis of native arteries of left leg with ulceration of other part of foot: Secondary | ICD-10-CM | POA: Diagnosis not present

## 2021-07-01 DIAGNOSIS — D696 Thrombocytopenia, unspecified: Secondary | ICD-10-CM

## 2021-07-01 DIAGNOSIS — E11621 Type 2 diabetes mellitus with foot ulcer: Secondary | ICD-10-CM | POA: Diagnosis not present

## 2021-07-01 DIAGNOSIS — L97509 Non-pressure chronic ulcer of other part of unspecified foot with unspecified severity: Secondary | ICD-10-CM | POA: Diagnosis not present

## 2021-07-01 DIAGNOSIS — I10 Essential (primary) hypertension: Secondary | ICD-10-CM

## 2021-07-01 DIAGNOSIS — Z8673 Personal history of transient ischemic attack (TIA), and cerebral infarction without residual deficits: Secondary | ICD-10-CM

## 2021-07-01 DIAGNOSIS — R1310 Dysphagia, unspecified: Secondary | ICD-10-CM | POA: Diagnosis not present

## 2021-07-01 DIAGNOSIS — S81801A Unspecified open wound, right lower leg, initial encounter: Secondary | ICD-10-CM

## 2021-07-01 IMAGING — DX RIGHT KNEE - COMPLETE 4+ VIEW
4 series · 4 of 4 positions shown · non-contrast
Comparison: None.

CLINICAL DATA: [AGE] female status post fall. Pain.

EXAM:
RIGHT KNEE - COMPLETE 4+ VIEW

[knee ap]
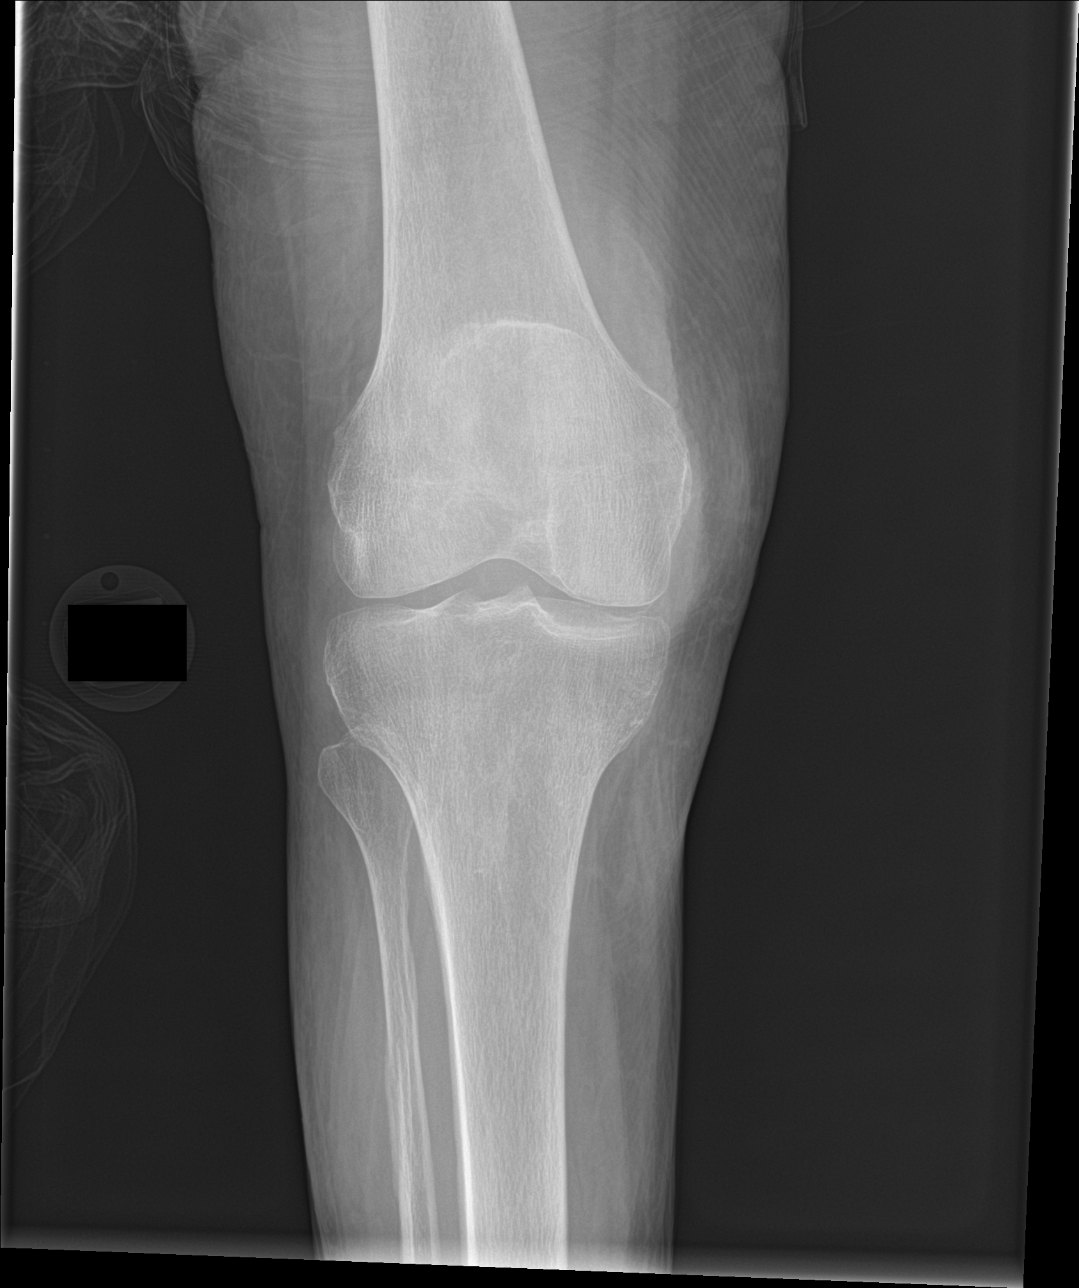

[knee lat]
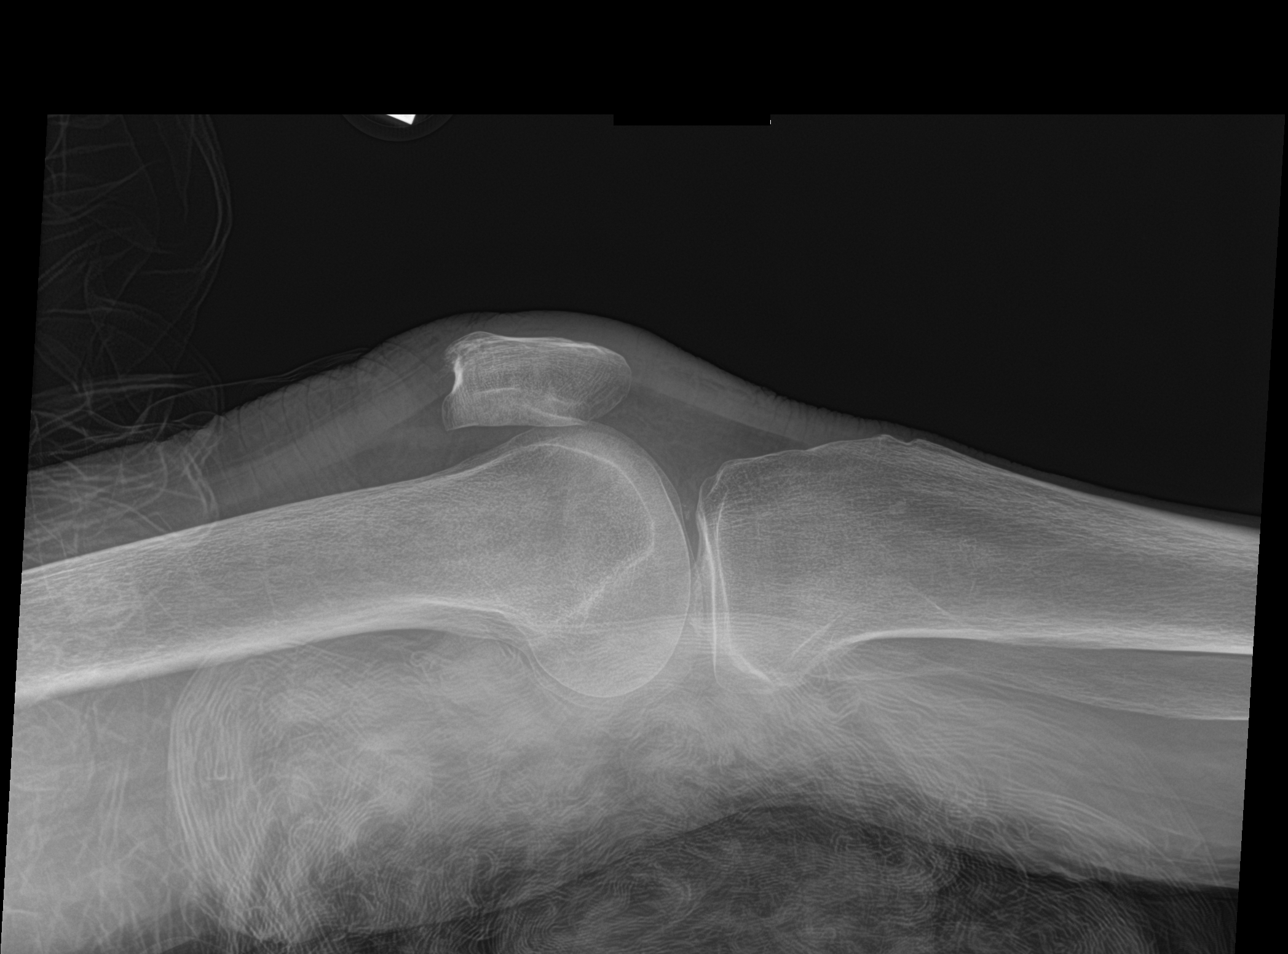

[knee obl (1 of 2)]
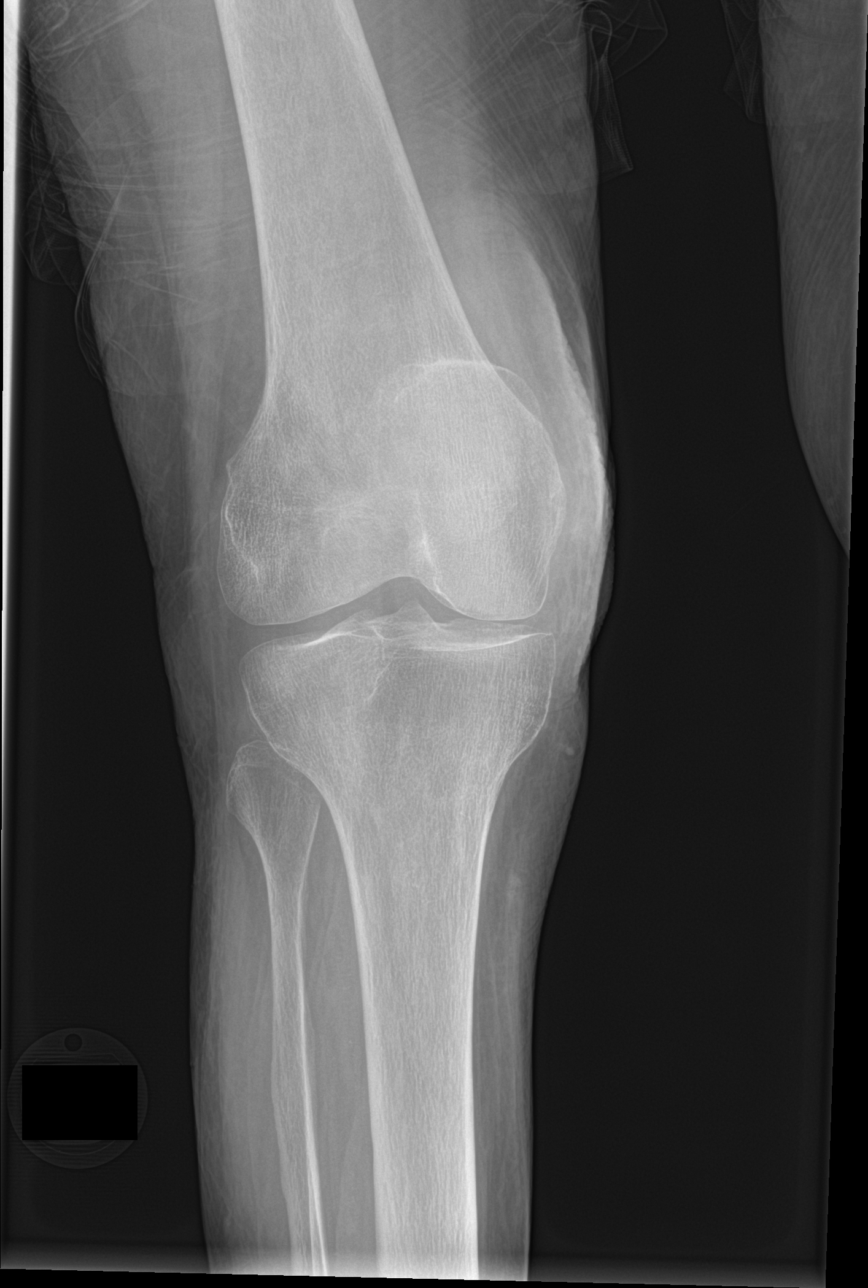

[knee obl (2 of 2)]
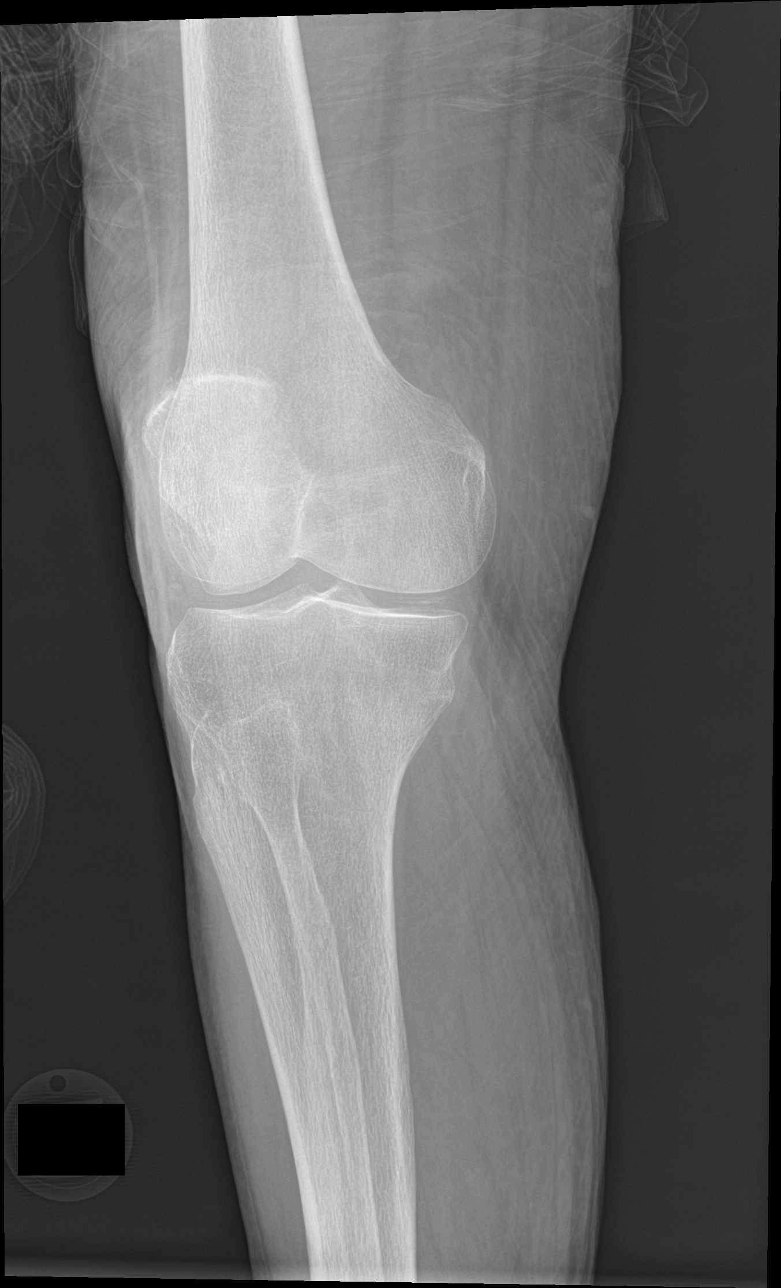

[4 of 4 positions shown; findings below may reference images not displayed]

FINDINGS: Bone mineralization is within normal limits for age. Possible trace
joint effusion on the cross-table lateral view. Patella appears
intact. No acute osseous abnormality identified. Joint spaces are
normal for age. No discrete soft tissue injury.
IMPRESSION: Trace joint effusion but no acute fracture or dislocation identified
about the right knee.

## 2021-07-01 NOTE — Progress Notes (Signed)
Patient ID: Angela Patterson, female   DOB: June 04, 1921, 87 y.o.   MRN: [id number]   Virtual Visit via video Note  This visit type was conducted due to national recommendations for restrictions regarding the COVID-19 pandemic (e.g. social distancing).  This format is felt to be most appropriate for this patient at this time.  All issues noted in this document were discussed and addressed.  No physical exam was performed (except for noted visual exam findings with Video Visits).   I connected with [name] by a video enabled telemedicine application and verified that I am speaking with the correct person using two identifiers. Location patient: home Location provider: work Persons participating in the virtual visit: patient, provider  The limitations, risks, security and privacy concerns of performing an evaluation and management service by video and the availability of in person appointments have been discussed.  It has also been discussed with the patient that there may be a patient responsible charge related to this service. The patient expressed understanding and agreed to proceed.   Reason for visit: follow up appt   HPI: Follow up regarding her blood pressure, blood sugar and her lower extremity wound.  She is accompanied by her daughter.  History obtained from both of them.  She is doing well.  Wound is healing.  Picture reviewed.  No evidence of infection.  No chest pain.  Breathing stable.  Has noticed some occasional choking when eating.  Is eating and eating well.  Avoiding straws.  Coughing at times - related to this.  No fever.  No chest congestion.  No abdominal pain.  Bowels stable.  No increased diarrhea.  Discussed swallowing evaluation and home ST.  Declines at this time.     ROS: See pertinent positives and negatives per HPI.  Past Medical History:  Diagnosis Date   Allergy    Chicken pox    CVA (cerebral vascular accident) ([place])    Diabetes mellitus ([place])     Hypercholesterolemia    Hypertension     Past Surgical History:  Procedure Laterality Date   ABDOMINAL HYSTERECTOMY  1971   excessive bleeding   APPENDECTOMY  age 54   BREAST LUMPECTOMY  1958   benign   DILATION AND CURETTAGE OF UTERUS  1970    Family History  Problem Relation Age of Onset   Liver cancer Father    Stroke Mother    Heart attack Brother    Hypertension Daughter    Hypertension Son    Hypertension Daughter    Cancer Grandchild        breast   Diabetes Grandchild    Breast cancer Neg Hx    Colon cancer Neg Hx     SOCIAL HX: reviewed.    Current Outpatient Medications:    amLODipine (NORVASC) 5 MG tablet, TAKE 1 TABLET(5 MG) BY MOUTH TWICE DAILY, Disp: 180 tablet, Rfl: 3   Cholecalciferol (VITAMIN D) 2000 units tablet, Take 2,000 Units by mouth daily. , Disp: , Rfl:    clopidogrel (PLAVIX) 75 MG tablet, TAKE 1 TABLET(75 MG) BY MOUTH DAILY, Disp: 90 tablet, Rfl: 1   empagliflozin (JARDIANCE) 25 MG TABS tablet, Take 1 tablet (25 mg total) by mouth daily., Disp: 90 tablet, Rfl: 3   glucose blood test strip, OneTouch Ultra Test strips, Disp: , Rfl:    glucose blood test strip, USE TO CHECK BLOOD SUGAR  TWO TIMES DAILY. Dx E11.9, Disp: 200 each, Rfl: 1   Lancet Devices (ONE TOUCH  DELICA LANCING DEV) MISC, Use twice daily Dx: 250.00, Disp: 100 each, Rfl: 5   Multiple Vitamin (MULTIVITAMIN) tablet, Take 1 tablet by mouth daily., Disp: , Rfl:    OMEGA-3 FATTY ACIDS PO, Take 720 mg by mouth daily., Disp: , Rfl:    rosuvastatin (CRESTOR) 5 MG tablet, TAKE 1 TABLET(5 MG) BY MOUTH 2 TIMES A WEEK, Disp: 26 tablet, Rfl: 1   sertraline (ZOLOFT) 25 MG tablet, Take 1-2 tablets (25-50 mg total) by mouth daily. (Patient taking differently: Take 25 mg by mouth daily.), Disp: 180 tablet, Rfl: 1   timolol (BETIMOL) 0.5 % ophthalmic solution, 1 drop daily., Disp: , Rfl:   EXAM:  GENERAL: alert, oriented, appears well and in no acute distress  HEENT: atraumatic, conjunttiva  clear, no obvious abnormalities on inspection of external nose and ears  NECK: normal movements of the head and neck  LUNGS: on inspection no signs of respiratory distress, breathing rate appears normal, no obvious gross SOB, gasping or wheezing  CV: no obvious cyanosis  PSYCH/NEURO: pleasant and cooperative, no obvious depression or anxiety, speech and thought processing grossly intact  ASSESSMENT AND PLAN:  Discussed the following assessment and plan:  Problem List Items Addressed This Visit     Anxiety    Continue zoloft.  Stable.        History of CVA (cerebrovascular accident)    Continue plavix.  Follow.        Hypertension    Blood pressure has been doing well on amlodipine.  Follow pressures.  Follow metabolic panel.       Pure hypercholesterolemia    On crestor.  Follow lipid panel and liver function tests.        Swallowing difficulty    Swallowing issues as outlined.  She is eating and reportedly eating well.  Avoiding straws.  Discussed taking small bites, chewing food well and eating slowly.  Desires no further evaluation at this time.       Thrombocytopenia (HCC)    Platelet count 125 last check.  Follow cbc.       Type 2 diabetes mellitus (HCC)    Sugars have been stable.  Continue jardiance.  Follow sugars.  Follow met b and a1c.       Wound of right leg    Improved.  Healing.  No evidence of infection.  Continue wound care as they are doing.  Follow.         Return in about 3 months (around 09/29/2021) for follow up appt (63mn).   I discussed the assessment and treatment plan with the patient. The patient was provided an opportunity to ask questions and all were answered. The patient agreed with the plan and demonstrated an understanding of the instructions.   The patient was advised to call back or seek an in-person evaluation if the symptoms worsen or if the condition fails to improve as anticipated.    CEinar Pheasant MD

## 2021-07-06 ENCOUNTER — Encounter: Payer: Self-pay | Admitting: Internal Medicine

## 2021-07-06 DIAGNOSIS — R1310 Dysphagia, unspecified: Secondary | ICD-10-CM | POA: Insufficient documentation

## 2021-07-06 NOTE — Assessment & Plan Note (Signed)
Improved.  Healing.  No evidence of infection.  Continue wound care as they are doing.  Follow.

## 2021-07-06 NOTE — Assessment & Plan Note (Addendum)
Sugars have been stable.  Continue jardiance.  Follow sugars.  Follow met b and a1c.

## 2021-07-06 NOTE — Assessment & Plan Note (Signed)
Platelet count 125 last check.  Follow cbc.

## 2021-07-06 NOTE — Assessment & Plan Note (Signed)
On crestor.  Follow lipid panel and liver function tests.   

## 2021-07-06 NOTE — Assessment & Plan Note (Signed)
Swallowing issues as outlined.  She is eating and reportedly eating well.  Avoiding straws.  Discussed taking small bites, chewing food well and eating slowly.  Desires no further evaluation at this time.

## 2021-07-06 NOTE — Assessment & Plan Note (Signed)
Continue zoloft.  Stable.  

## 2021-07-06 NOTE — Assessment & Plan Note (Signed)
Blood pressure has been doing well on amlodipine.  Follow pressures.  Follow metabolic panel.

## 2021-07-06 NOTE — Assessment & Plan Note (Signed)
Continue plavix.  Follow.

## 2021-07-09 ENCOUNTER — Ambulatory Visit (INDEPENDENT_AMBULATORY_CARE_PROVIDER_SITE_OTHER): Payer: Medicare Other | Admitting: Pharmacist

## 2021-07-09 DIAGNOSIS — E7800 Pure hypercholesterolemia, unspecified: Secondary | ICD-10-CM

## 2021-07-09 DIAGNOSIS — I10 Essential (primary) hypertension: Secondary | ICD-10-CM

## 2021-07-09 DIAGNOSIS — E1140 Type 2 diabetes mellitus with diabetic neuropathy, unspecified: Secondary | ICD-10-CM

## 2021-07-09 NOTE — Chronic Care Management (AMB) (Signed)
Chronic Care Management CCM Pharmacy Note  07/09/2021 Name:  Angela Patterson MRN:  [id number] DOB:  Feb 23, 1921  Summary: - Tolerating regimen well. Due to reapply for assistance  Recommendations/Changes made from today's visit: - Will collaborate with patient, PCP on Jardiance reapplication - Scheduled 3 m PCP f/u  Subjective: Angela Patterson is an 87 y.o. year old female who is a primary patient of Einar Pheasant, MD.  The CCM team was consulted for assistance with disease management and care coordination needs.    Engaged with patient's daughter, [name] (on [place]) by telephone for follow up visit for pharmacy case management and/or care coordination services.   Objective:  Medications Reviewed Today     Reviewed by De Hollingshead, RPH-CPP (Pharmacist) on 07/09/21 at 1031  Med List Status: <None>   Medication Order Taking? Sig Documenting Provider Last Dose Status Informant  amLODipine (NORVASC) 5 MG tablet [id number] Yes TAKE 1 TABLET(5 MG) BY MOUTH TWICE DAILY Einar Pheasant, MD Taking Active   Cholecalciferol (VITAMIN D) 2000 units tablet [id number] Yes Take 2,000 Units by mouth daily.  [provider] Taking Active Child  clopidogrel (PLAVIX) 75 MG tablet [id number] Yes TAKE 1 TABLET(75 MG) BY MOUTH DAILY Einar Pheasant, MD Taking Active   Cranberry 500 MG CHEW [id number] Yes Chew 1 each by mouth daily. [provider] Taking Active   empagliflozin (JARDIANCE) 25 MG TABS tablet [id number] Yes Take 1 tablet (25 mg total) by mouth daily. Einar Pheasant, MD Taking Active   glucose blood test strip [id number] Yes OneTouch Ultra Test strips [provider] Taking Active Child  glucose blood test strip [id number] Yes USE TO CHECK BLOOD SUGAR  TWO TIMES DAILY. Dx E11.9 Einar Pheasant, MD Taking Active   Lancet Devices (ONE [facility] DELICA LANCING DEV) [place] [id number] Yes Use twice daily Dx: 250.00 Einar Pheasant, MD Taking Active Child  Multiple Vitamin  (MULTIVITAMIN) tablet [id number] Yes Take 1 tablet by mouth daily. [provider] Taking Active   OMEGA-3 FATTY ACIDS PO [id number] Yes Take 720 mg by mouth daily. [provider] Taking Active Child  rosuvastatin (CRESTOR) 5 MG tablet [id number] Yes TAKE 1 TABLET(5 MG) BY MOUTH 2 TIMES A WEEK Scott, Charlene, MD Taking Active   sertraline (ZOLOFT) 25 MG tablet [id number] Yes Take 1-2 tablets (25-50 mg total) by mouth daily.  Patient taking differently: Take 25 mg by mouth daily.   Einar Pheasant, MD Taking Active            Med Note Ulen, JULIE A   Wed Jun 04, 2021 10:27 AM) 2 tabs at night  timolol (BETIMOL) 0.5 % ophthalmic solution [id number] Yes 1 drop daily. [provider] Taking Active Child            Pertinent Labs:   Lab Results  Component Value Date   HGBA1C 8.7 (H) 12/20/2020   Lab Results  Component Value Date   CHOL 138 12/20/2020   HDL 52.20 12/20/2020   LDLCALC 60 12/20/2020   LDLDIRECT 107.0 06/12/2020   TRIG 128.0 12/20/2020   CHOLHDL 3 12/20/2020   Lab Results  Component Value Date   CREATININE 0.76 12/20/2020   BUN 22 12/20/2020   NA 139 12/20/2020   K 4.1 12/20/2020   CL 102 12/20/2020   CO2 26 12/20/2020    SDOH:  (Social Determinants of Health) assessments and interventions performed:  SDOH Interventions    Flowsheet Row Most Recent Value  SDOH Interventions   Financial Strain Interventions  Other (Comment)  [manufacturer assistance]       CCM Care Plan  Review of patient past medical history, allergies, medications, health status, including review of consultants reports, laboratory and other test data, was performed as part of comprehensive evaluation and provision of chronic care management services.   Care Plan : Medication Management  Updates made by De Hollingshead, RPH-CPP since 07/09/2021 12:00 AM     Problem: Diabetes, hx CVA      Long-Range Goal: Disease Progression Prevention   Start Date:  11/15/2020  Recent Progress: On track  Priority: High  Note:   Current Barriers:  Unable to independently afford treatment regimen Unable to achieve control of diabetes   Pharmacist Clinical Goal(s):  Over the next 90 days, patient will verbalize ability to afford treatment regimen Over the next 90 days, patient will achieve control of diabetes as evidenced by A1c through collaboration with PharmD and provider.   Interventions: 1:1 collaboration with Einar Pheasant, MD regarding development and update of comprehensive plan of care as evidenced by provider attestation and co-signature Inter-disciplinary care team collaboration (see longitudinal plan of care) Comprehensive medication review performed; medication list updated in electronic medical record  Health Maintenance   Yearly diabetic eye exam: due - recommended that [name] schedule this Yearly diabetic foot exam: due- placed reminder in upcoming appointment notes Urine microalbumin: up to date Yearly influenza vaccination: up to date Td/Tdap vaccination: up to date Pneumonia vaccination: up to date COVID vaccinations: due - declined Shingrix vaccinations: due - declined Colonoscopy: up to date Bone density scan: up to date Mammogram: up to date  Diabetes: Uncontrolled, but more relaxed goal of <8% with no side effects is likely appropriate; current treatment: Jardiance 25 mg daily  Approved for Jardiance assistance through 07/19/21 Hx metformin therapy. Discontinued at recent hospitalization due to lactic acidosis Current glucose readings: fasting 190s ; post prandial 200-250s Reports leg wound is healing well. Given that, along with age, glucose control is adequate Continue current regimen at this time Will collaborate w/ CPhT, patient, and providers to reapply for patient assistance for Jardiace for 2023. Hypertension: Controlled at last visit; current treatment: amlodipine 5 mg BID  Home readings: not checking  recently, but they plan to start checking periodically.  Recommended to continue current regimen. Encouraged periodic home BP checks.   Hyperlipidemia/ secondary ASCVD prevention Appropriately managed; current treatment: rosuvastatin 5 mg 2 days weekly (Wednesday and Saturday) Antiplatelet therapy: clopidogrel 75 mg daily (hx CVA)  Recommended to continue current regimen.  Depression Appropriately managed per last report to PCP; current treatment: sertraline 50 mg daily (2 25 mg tabs) Previously advised [name] that the script could be changed to a 50 mg tablet strength in the future.  Recommended to continue current regimen.  GERD?/Choking Uncontrolled; [name] reports occasional episodes of choking if patient drinks/eats too big of bites. Reports PCP advised trial of famotidine, but that they have not tried it yet Reviewed to trial famotidine 20 mg ~ 30 minutes before a meal. Recommended to try for ~ a week and if no benefit, reach back out  Supplements: Vitamin D, omega 3 fatty acids, multivitamin  Patient Goals/Self-Care Activities Over the next 90 days, patient will:  - take medications as prescribed check glucose 2-3 times weekly, document, and provide at future appointments collaborate with provider on medication access solutions      Plan: Telephone follow up appointment with care management team member scheduled for:  5 months  Catie Darnelle Maffucci, PharmD, [place], CPP  Contractor at [employer] [phone]

## 2021-07-09 NOTE — Patient Instructions (Signed)
Visit Information  Following are the goals we discussed today:  Patient Goals/Self-Care Activities Over the next 90 days, patient will:  - take medications as prescribed check glucose 2-3 times weekly, document, and provide at future appointments collaborate with provider on medication access solutions        Plan: Telephone follow up appointment with care management team member scheduled for:  5 months   Catie Darnelle Maffucci, PharmD, [place], CPP Clinical Pharmacist [place] at [facility] [phone]     Please call the care guide team at [phone] if you need to cancel or reschedule your appointment.   Patient verbalizes understanding of instructions provided today and agrees to view in [place].

## 2021-07-10 ENCOUNTER — Encounter: Payer: Self-pay | Admitting: Internal Medicine

## 2021-07-10 NOTE — Telephone Encounter (Signed)
See message.

## 2021-07-16 ENCOUNTER — Encounter: Payer: Self-pay | Admitting: Internal Medicine

## 2021-07-16 ENCOUNTER — Ambulatory Visit (INDEPENDENT_AMBULATORY_CARE_PROVIDER_SITE_OTHER): Payer: Medicare Other

## 2021-07-16 ENCOUNTER — Telehealth: Payer: Self-pay

## 2021-07-16 VITALS — Ht 62.0 in | Wt 106.0 lb

## 2021-07-16 DIAGNOSIS — Z0000 Encounter for general adult medical examination without abnormal findings: Secondary | ICD-10-CM

## 2021-07-16 NOTE — Telephone Encounter (Signed)
Patient daughter call back in to speak with Denisa, Please call back

## 2021-07-16 NOTE — Patient Instructions (Addendum)
°  Ms. Horiuchi , Thank you for taking time to come for your Medicare Wellness Visit. I appreciate your ongoing commitment to your health goals. Please review the following plan we discussed and let me know if I can assist you in the future.   These are the goals we discussed:  Goals       Patient Stated     Medication Monitoring (pt-stated)      Patient Goals/Self-Care Activities Over the next 90 days, patient will:  - take medications as prescribed check glucose 2-3 times weekly, document, and provide at future appointments collaborate with provider on medication access solutions      Other     Follow up with Primary Care Provider as needed        This is a list of the screening recommended for you and due dates:  Health Maintenance  Topic Date Due   Eye exam for diabetics  03/03/2021   Hemoglobin A1C  06/21/2021   COVID-19 Vaccine (3 - Booster for Pfizer series) 07/17/2021*   Zoster (Shingles) Vaccine (1 of 2) 09/29/2021*   Urine Protein Check  12/20/2021   Complete foot exam   05/09/2022   Tetanus Vaccine  02/15/2029   Pneumonia Vaccine  Completed   Flu Shot  Completed   HPV Vaccine  Aged Out   Mammogram  Discontinued   DEXA scan (bone density measurement)  Discontinued  *Topic was postponed. The date shown is not the original due date.

## 2021-07-16 NOTE — Telephone Encounter (Signed)
Daughter is aware 

## 2021-07-16 NOTE — Telephone Encounter (Addendum)
Provider felt that in person eval of wound would be more efficient given patients age, history, wounds, etc. Patient has been scheduled with [facility] to see Dr Amalia Hailey on Friday 12/30.

## 2021-07-16 NOTE — Telephone Encounter (Signed)
Unable to reach patient on preferred number. No answer. Unable to leave message. Reschedule.

## 2021-07-16 NOTE — Progress Notes (Addendum)
Subjective:   Angela Patterson is a 87 y.o. female who presents for Medicare Annual (Subsequent) preventive examination.  Review of Systems    No ROS.  Medicare Wellness Virtual Visit.  Visual/audio telehealth visit, UTA vital signs.   See social history for additional risk factors.   Cardiac Risk Factors include: advanced age (>3men, >66 women)     Objective:    Today's Vitals   07/16/21 1322  Weight: 106 lb (48.1 kg)  Height: 5\' 2"  (1.575 m)   Body mass index is 19.39 kg/m.  Advanced Directives 07/16/2021 10/19/2020 10/18/2020 07/15/2020 07/12/2019 02/16/2019 07/06/2018  Does Patient Have a Medical Advance Directive? Yes - No Yes No No Yes  Type of Paramedic of [place];Living will - - [place];Living will  Does patient want to make changes to medical advance directive? No - Patient declined - - No - Patient declined - - No - Patient declined  Copy of [place] in Chart? Yes - validated most recent copy scanned in chart (See row information) - - No - copy requested - - No - copy requested  Would patient like information on creating a medical advance directive? - No - Patient declined - - Yes (MAU/Ambulatory/Procedural Areas - Information given) - -    Current Medications (verified) Outpatient Encounter Medications as of 07/16/2021  Medication Sig   amLODipine (NORVASC) 5 MG tablet TAKE 1 TABLET(5 MG) BY MOUTH TWICE DAILY   Cholecalciferol (VITAMIN D) 2000 units tablet Take 2,000 Units by mouth daily.    clopidogrel (PLAVIX) 75 MG tablet TAKE 1 TABLET(75 MG) BY MOUTH DAILY   Cranberry 500 MG CHEW Chew 1 each by mouth daily.   empagliflozin (JARDIANCE) 25 MG TABS tablet Take 1 tablet (25 mg total) by mouth daily.   glucose blood test strip OneTouch Ultra Test strips   glucose blood test strip USE TO CHECK BLOOD SUGAR  TWO TIMES DAILY. Dx E11.9   Lancet Devices (ONE TOUCH DELICA LANCING  DEV) MISC Use twice daily Dx: 250.00   Multiple Vitamin (MULTIVITAMIN) tablet Take 1 tablet by mouth daily.   OMEGA-3 FATTY ACIDS PO Take 720 mg by mouth daily.   rosuvastatin (CRESTOR) 5 MG tablet TAKE 1 TABLET(5 MG) BY MOUTH 2 TIMES A WEEK   sertraline (ZOLOFT) 25 MG tablet Take 1-2 tablets (25-50 mg total) by mouth daily. (Patient taking differently: Take 25 mg by mouth daily.)   timolol (BETIMOL) 0.5 % ophthalmic solution 1 drop daily.   No facility-administered encounter medications on file as of 07/16/2021.    Allergies (verified) Actos [pioglitazone], Contrast media [iodinated contrast media], Prandin [repaglinide], and Pravastatin sodium   History: Past Medical History:  Diagnosis Date   Allergy    Chicken pox    CVA (cerebral vascular accident) ([place])    Diabetes mellitus ([place])    Hypercholesterolemia    Hypertension    Past Surgical History:  Procedure Laterality Date   ABDOMINAL HYSTERECTOMY  1971   excessive bleeding   APPENDECTOMY  age 70   BREAST LUMPECTOMY  1958   benign   DILATION AND CURETTAGE OF UTERUS  1970   Family History  Problem Relation Age of Onset   Liver cancer Father    Stroke Mother    Heart attack Brother    Hypertension Daughter    Hypertension Son    Hypertension Daughter    Cancer Grandchild  breast   Diabetes Grandchild    Breast cancer Neg Hx    Colon cancer Neg Hx    Social History   Socioeconomic History   Marital status: Widowed    Spouse name: Not on file   Number of children: 3   Years of education: Not on file   Highest education level: Not on file  Occupational History   Not on file  Tobacco Use   Smoking status: Never   Smokeless tobacco: Never  Substance and Sexual Activity   Alcohol use: No    Alcohol/week: 0.0 standard drinks   Drug use: No   Sexual activity: Not on file  Other Topics Concern   Not on file  Social History Narrative   Not on file   Social Determinants of Health   Financial  Resource Strain: Medium Risk   Difficulty of Paying Living Expenses: Somewhat hard  Food Insecurity: No Food Insecurity   Worried About Running Out of Food in the Last Year: Never true   Ran Out of Food in the Last Year: Never true  Transportation Needs: No Transportation Needs   Lack of Transportation (Medical): No   Lack of Transportation (Non-Medical): No  Physical Activity: Insufficiently Active   Days of Exercise per Week: 7 days   Minutes of Exercise per Session: 20 min  Stress: No Stress Concern Present   Feeling of Stress : Not at all  Social Connections: Unknown   Frequency of Communication with Friends and Family: Not on file   Frequency of Social Gatherings with Friends and Family: More than three times a week   Attends Religious Services: Not on Electrical engineer or Organizations: Not on file   Attends Archivist Meetings: Not on file   Marital Status: Not on file    Tobacco Counseling Counseling given: Not Answered   Clinical Intake:  Pre-visit preparation completed: Yes        Diabetes: Yes (Followed by PCP)  How often do you need to have someone help you when you read instructions, pamphlets, or other written materials from your doctor or pharmacy?: 1 - Never  Diabetes: How often do you monitor your CBG's? Twice weekly.   Diabetes Management: Followed by PCP.  Does the patient want to be seen by Chronic Care Management for management of their diabetes?  No  Would the patient like to be referred to a Nutritionist or for Diabetic Management?  No   Diabetic Exams: Diabetic Eye Exam: Overdue for diabetic eye exam. Pt has been advised about the importance in completing this exam. Patient advised to call and schedule an eye exam. Daughter plans to schedule.     Interpreter Needed?: No      Activities of Daily Living In your present state of health, do you have any difficulty performing the following activities: 07/16/2021 10/19/2020   Hearing? Y Y  Comment Hearing aids -  Vision? N N  Difficulty concentrating or making decisions? N N  Walking or climbing stairs? Y N  Comment Unsteady gait -  Dressing or bathing? N N  Doing errands, shopping? Y N  Comment - daughter gets groceries for [employer] and eating ? N -  Using the Toilet? N -  In the past six months, have you accidently leaked urine? Y -  Comment Managed with daily depend. Followed by pcp -  Do you have problems with loss of bowel control? N -  Managing your Medications?  Y -  Managing your Finances? Y -  Housekeeping or managing your Housekeeping? Y -  Some recent data might be hidden    Patient Care Team: Einar Pheasant, MD as PCP - General (Internal Medicine) De Hollingshead, RPH-CPP (Pharmacist)  Indicate any recent Medical Services you may have received from other than [facility] providers in the past year (date may be approximate).     Assessment:   This is a routine wellness examination for Angela Patterson.  Virtual Visit via Telephone Note  I connected with  [name] on 07/16/21 at  1:15 PM EST by telephone and verified that I am speaking with the correct person using two identifiers.  Persons participating in the virtual visit: patient/daughter/Nurse Health Advisor   I discussed the limitations, risks, security and privacy concerns of performing an evaluation and management service by telephone and the availability of in person appointments. The patient expressed understanding and agreed to proceed.  Interactive audio and video telecommunications were attempted between this nurse and patient, however failed, due to patient having technical difficulties OR patient did not have access to video capability.  We continued and completed visit with audio only.  Some vital signs may be absent or patient reported.   Hearing/Vision screen Hearing Screening - Comments:: Followed by ENT Visits as needed  Hearing aid, bilateral  Vision Screening  - Comments:: Followed by [facility]  Wears corrective lenses  Visits every 12 months  No retinopathy reported  Cataract extraction, bilateral  They have regular follow up with the ophthalmologist  Dietary issues and exercise activities discussed:  Regular diet   Goals Addressed             This Visit's Progress    Follow up with Primary Care Provider       As needed       Depression Screen [facility] 2/9 Scores 07/16/2021 06/05/2021 07/15/2020 07/12/2019 07/06/2018 07/05/2017 03/02/2017  PHQ - 2 Score 0 0 0 0 0 0 0  PHQ- 9 Score - - - - - 0 -    Fall Risk Fall Risk  07/16/2021 06/05/2021 07/15/2020 07/12/2019 07/06/2018  Falls in the past year? 1 1 0 1 0  Number falls in past yr: - 1 0 - -  Injury with Fall? - 1 0 - -  Risk for fall due to : History of fall(s) History of fall(s) - - -  Follow up Falls evaluation completed Falls evaluation completed Falls evaluation completed Falls prevention discussed -    FALL RISK PREVENTION PERTAINING TO THE HOME: Home free of loose throw rugs in walkways, pet beds, electrical cords, etc? Yes  Adequate lighting in your home to reduce risk of falls? Yes   ASSISTIVE DEVICES UTILIZED TO PREVENT FALLS:  Life alert? Yes  Use of a cane, walker or w/c? Yes  Grab bars in the bathroom? Yes  Shower chair or bench in shower? Yes   TIMED UP AND GO: Was the test performed? No .   Cognitive Function:  Patient is alert.  Monitors days/months by reading the newspaper. Enjoys word search puzzles.   6CIT Screen 07/06/2018 07/05/2017  What Year? 0 points 0 points  What month? 0 points 0 points  What time? 0 points 0 points  Count back from 20 0 points 0 points  Months in reverse 0 points 0 points  Repeat phrase 0 points 0 points  Total Score 0 0    Immunizations Immunization History  Administered Date(s) Administered  DTaP 03/21/2012   Fluad Quad(high Dose 65+) 04/19/2019, 06/12/2020, 05/09/2021   Influenza Split 03/21/2012,  04/03/2014   Influenza, High Dose Seasonal PF 04/30/2017, 05/02/2018   Influenza-Unspecified 04/09/2015, 04/14/2016, 04/28/2017   PFIZER(Purple Top)SARS-COV-2 Vaccination 08/10/2019, 08/31/2019   Pneumococcal Conjugate-13 07/09/2014   Pneumococcal Polysaccharide-23 07/09/2012   Tdap 02/16/2019   Screening Tests Health Maintenance  Topic Date Due   OPHTHALMOLOGY EXAM  03/03/2021   HEMOGLOBIN A1C  06/21/2021   COVID-19 Vaccine (3 - Booster for Pfizer series) 07/17/2021 (Originally 10/26/2019)   Zoster Vaccines- Shingrix (1 of 2) 09/29/2021 (Originally [birth year]/1973)   URINE MICROALBUMIN  12/20/2021   FOOT EXAM  05/09/2022   TETANUS/TDAP  02/15/2029   Pneumonia Vaccine 61+ Years old  Completed   INFLUENZA VACCINE  Completed   HPV VACCINES  Aged Out   MAMMOGRAM  Discontinued   DEXA SCAN  Discontinued   Health Maintenance Health Maintenance Due  Topic Date Due   OPHTHALMOLOGY EXAM  03/03/2021   HEMOGLOBIN A1C  06/21/2021   Lung Cancer Screening: (Low Dose CT Chest recommended if Age 69-80 years, 30 pack-year currently smoking OR have quit w/in 15years.) does not qualify.   Hepatitis C Screening: does not qualify.  Vision Screening: Recommended annual ophthalmology exams for early detection of glaucoma and other disorders of the eye.  Dental Screening: Recommended annual dental exams for proper oral hygiene  Community Resource Referral / Chronic Care Management: CRR required this visit?  No   CCM required this visit?  No      Plan:   Keep all routine maintenance appointments.   I have personally reviewed and noted the following in the patients chart:   Medical and social history Use of alcohol, tobacco or illicit drugs  Current medications and supplements including opioid prescriptions. Not taking opioids. Functional ability and status Nutritional status Physical activity Advanced directives List of other physicians Hospitalizations, surgeries, and ER visits in previous  12 months Vitals Screenings to include cognitive, depression, and falls Referrals and appointments  In addition, I have reviewed and discussed with patient certain preventive protocols, quality metrics, and best practice recommendations. A written personalized care plan for preventive services as well as general preventive health recommendations were provided to patient.     OBrien-Blaney, Riddik Senna L, LPN   22/08/5425    I have reviewed the above information and agree with above.   Deborra Medina, MD

## 2021-07-16 NOTE — Telephone Encounter (Signed)
Spoke with daughter and advised no availability tomorrow. Tentatively scheduled for VV Friday at 12:00. Discussed with [place] supervisor to confirm provider ok to do wound eval virtually. Will confirm with daughter tomorrow.

## 2021-07-16 NOTE — Telephone Encounter (Signed)
Pt daughter callback to speak with you. Pt daughter requesting callback.

## 2021-07-16 NOTE — Telephone Encounter (Signed)
Advised that Dr Nicki Reaper is out of the office until next week. Daughter is going to call her podiatrist and let me know if they are not able to see her.

## 2021-07-18 ENCOUNTER — Ambulatory Visit (INDEPENDENT_AMBULATORY_CARE_PROVIDER_SITE_OTHER): Payer: Medicare Other | Admitting: Podiatry

## 2021-07-18 ENCOUNTER — Other Ambulatory Visit: Payer: Self-pay

## 2021-07-18 ENCOUNTER — Other Ambulatory Visit: Payer: Self-pay | Admitting: Podiatry

## 2021-07-18 ENCOUNTER — Encounter: Payer: Self-pay | Admitting: Podiatry

## 2021-07-18 DIAGNOSIS — L97311 Non-pressure chronic ulcer of right ankle limited to breakdown of skin: Secondary | ICD-10-CM | POA: Diagnosis not present

## 2021-07-18 DIAGNOSIS — L97512 Non-pressure chronic ulcer of other part of right foot with fat layer exposed: Secondary | ICD-10-CM

## 2021-07-18 MED ORDER — GENTAMICIN SULFATE 0.1 % EX CREA: 1.0000 "application " | TOPICAL_CREAM | Freq: Two times a day (BID) | CUTANEOUS | 1 refills | Status: DC

## 2021-07-18 MED ORDER — DOXYCYCLINE HYCLATE 100 MG PO TABS
100.0000 mg | ORAL_TABLET | Freq: Two times a day (BID) | ORAL | 0 refills | Status: DC
Start: 2021-07-18 — End: 2021-08-25

## 2021-07-19 DIAGNOSIS — E7800 Pure hypercholesterolemia, unspecified: Secondary | ICD-10-CM | POA: Diagnosis not present

## 2021-07-19 DIAGNOSIS — I10 Essential (primary) hypertension: Secondary | ICD-10-CM | POA: Diagnosis not present

## 2021-07-19 DIAGNOSIS — E1140 Type 2 diabetes mellitus with diabetic neuropathy, unspecified: Secondary | ICD-10-CM | POA: Diagnosis not present

## 2021-07-23 ENCOUNTER — Telehealth: Payer: Self-pay | Admitting: Pharmacy Technician

## 2021-07-23 DIAGNOSIS — Z596 Low income: Secondary | ICD-10-CM

## 2021-07-23 LAB — WOUND CULTURE

## 2021-07-23 NOTE — Progress Notes (Signed)
[facility])                                            [place] Team    07/23/2021  HEIDEMARIE GOODNOW August 06, 1920 [id number]                                      Medication Assistance Referral  Referral From: [facility] Embedded RPh Catie T.   Medication/Company: [name] / BI Patient application portion:  Mailed Provider application portion: Interoffice Mailed to Dr. Nicki Reaper Provider address/fax verified via: Office website   Marketta Valadez P. Chaquetta Schlottman, [place]  [phone]

## 2021-07-27 NOTE — Progress Notes (Signed)
HPI: 88 y.o. female presenting today for follow-up evaluation of a chronic ulcer to the plantar aspect of the left foot.  Patient also has a new complaint of a wound that developed to the lateral aspect of the right foot and ankle.  Patient states that on 05/15/2021 she fell in her tub and developed a wound to the lateral aspect of the ankle.  Is been very painful and tender and the patient is on Plavix secondary to stroke several years ago.  She said there was a heavy amount of bleeding.  They have been dressing the foot daily.  The present for further treatment and evaluation  Past Medical History:  Diagnosis Date   Allergy    Chicken pox    CVA (cerebral vascular accident) ([place])    Diabetes mellitus ([place])    Hypercholesterolemia    Hypertension     Past Surgical History:  Procedure Laterality Date   ABDOMINAL HYSTERECTOMY  1971   excessive bleeding   APPENDECTOMY  age 89   BREAST LUMPECTOMY  1958   benign   DILATION AND CURETTAGE OF UTERUS  1970    Allergies  Allergen Reactions   Actos [Pioglitazone] Swelling   Contrast Media [Iodinated Contrast Media] Swelling   Prandin [Repaglinide] Swelling   Pravastatin Sodium     cramps         Physical Exam: General: The patient is alert and oriented x3 in no acute distress.  Dermatology: There continues to be wound to the plantar aspect of the first MTP joint left foot that appears very stable.  It measures approximately 1.0 x 1.0 x 0.1 cm.  New wounds developed to the lateral malleolus with a well adhered eschar it measures approximately 1.0 x 1.0 x 0.1 cm.  there is some slight erythema around the area.  No drainage.  No malodor.  There is also a crusted ulcer to the lateral aspect of the proximal ankle/leg.  Please see above noted photo.  Minimal serous drainage noted.  Vascular: Palpable pedal pulses bilaterally. Capillary refill within normal limits.    Neurological: Light touch and protective threshold grossly  intact  Musculoskeletal Exam: No pedal deformities noted  Radiographic Exam:  X-rays were taken here in the office and reviewed however they are not transitioned over to the epic system.  No fractures were identified.  Diffuse degenerative changes noted which is within normal limits given the patient's age.  Assessment: 1.  Ulcer/wound right lateral ankle x2 secondary to fall injury.  DOI: 05/15/2021 2.  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 Patient evaluated. X-Rays reviewed.  2.  Cultures were taken of the proximal wound with the serous drainage and sent to pathology 3.  Medically necessary excisional debridement including subcutaneous tissue was performed using a tissue nipper.  Excisional debridement of all necrotic nonviable tissue down to healthier bleeding viable tissue was performed.  Was not too aggressive with this due to the patient being on anticoagulant medication and therapy 4.  Prescription for doxycycline 100 mg 2 times daily #20 5.  Prescription for gentamicin cream applied daily with a light dressing 6.  Return to clinic in 2 weeks.  We will likely need to update x-rays in 2 weeks since they did not transition over into the epic system      [name], DPM [facility]  Dr. [name], DPM    [address]  [place], [place] [address]                Office [phone]  Fax [phone]

## 2021-07-28 ENCOUNTER — Telehealth: Payer: Self-pay | Admitting: Pharmacist

## 2021-07-28 NOTE — Telephone Encounter (Signed)
Patient's daughter, [name], called in. Wondered if patient's income still met eligibility for [facility] Cares. Confirmed that it does. She also notes they have about a week of Jardiance 25 mg remaining. Will prepare sample  Medication Samples have been provided to the patient.  Drug name: Jardiance       Strength: 25 mg        Qty: 2 boxes  LOT: [id number]   Exp.Date: 01/2023  Dosing instructions: Take 1 tablet by mouth daily   The patient has been instructed regarding the correct time, dose, and frequency of taking this medication, including desired effects and most common side effects.   De Hollingshead 1:13 PM 07/28/2021

## 2021-07-30 ENCOUNTER — Other Ambulatory Visit: Payer: Self-pay

## 2021-07-30 ENCOUNTER — Other Ambulatory Visit: Payer: Medicare Other | Admitting: Nurse Practitioner

## 2021-07-30 ENCOUNTER — Encounter: Payer: Self-pay | Admitting: Nurse Practitioner

## 2021-07-30 DIAGNOSIS — R630 Anorexia: Secondary | ICD-10-CM

## 2021-07-30 DIAGNOSIS — R1310 Dysphagia, unspecified: Secondary | ICD-10-CM

## 2021-07-30 DIAGNOSIS — Z515 Encounter for palliative care: Secondary | ICD-10-CM

## 2021-07-30 DIAGNOSIS — I639 Cerebral infarction, unspecified: Secondary | ICD-10-CM | POA: Diagnosis not present

## 2021-07-30 NOTE — Progress Notes (Signed)
[facility] Palliative Care Consult Note Telephone: [phone]  Fax: [phone]    Date of encounter: 07/30/21 4:54 PM PATIENT NAME: Angela Patterson [place] [address]   [phone] (home)  DOB: Jun 16, 1921 MRN: [id number] PRIMARY CARE PROVIDER:    Einar Pheasant, MD,  [address] [place] [address] [phone]  RESPONSIBLE PARTY:    Contact Information     Name Relation Home Work Mobile   Angela Patterson Daughter [phone]      I met face to face with patient and family in home. Palliative Care was asked to follow this patient by consultation request of  Angela Pheasant, MD to address advance care planning and complex medical decision making. This is a follow up visit.                                  ASSESSMENT AND PLAN / RECOMMENDATIONS:  Symptom Management/Plan: ACP:  DNR; MOST form in [place]; Wishes limited addition interventions, +antibiotics, +IVF, No feeding tube   2.  Anorexia:  Improving.  Angela Patterson gained 2 cm right arm; 1cm right thigh; Angela Patterson endorses a good appetite and continues to have family support with meals and Meals on Wheels. Will continue to monitor with re-visit hospice should >10% loss Current weight today 105 lbs Right upper arm 21 cm Right thigh 33 cm  3.  Wound to right lower leg secondary to Fall: improving, continue to be followed by Podiatry who is managing.    4. Dysphagia secondary to CVA:  we talked about increase in PND, discussed option of zyrtec to see about minimizing allergies to help. We talked about option of thicket, though Angela Patterson does not like. We talked about choking with secretions at times and water. We talked about aspiration precautions.    Follow up Palliative Care Visit: Palliative care will continue to follow for complex medical decision making, advance care planning, and clarification of goals. Return 8  weeks or prn.  I spent 61 minutes providing this consultation. More than 50% of the time in this consultation was spent in counseling and care coordination. PPS: 50%  Chief Complaint: Follow up palliative consult for complex medical decision making  HISTORY OF PRESENT ILLNESS:  Angela Patterson is a 88 y.o. year old female  with multiple medical problems including h/o CVA,(nonhemorrhagic), TIA, DM, HTN, HLD, glaucoma, allergies, anxiety.  I called Angela Patterson to confirm follow up palliative visit with Angela Patterson, Angela Patterson's daughter. Angela Patterson in agreement. I visited Angela Patterson and her daughter Angela Patterson in their home. We talked about how Ms. Coval has been doing. No recent falls, hospitalizations, infections. Wound has been healing, cared for by Podiatry. We talked about ros, symptoms, weights, appetite, functional ability, no new cognitive changes. We talked about dysphagia, aspiration precautions, thicket, option of trying zyrtec to help with secretions, drainage. We talked about medical goals, reviewed role pc in poc. Therapeutic listening, emotional support provided. Questions answered. Scheduled f/u PC visit.   History obtained from review of EMR, discussion with Angela Patterson, Angela Patterson's daughter and [name].  I reviewed available labs, medications, imaging, studies and related documents from the EMR.  Records reviewed and summarized above.   ROS 10 point system reviewed all negative except HPI  Physical Exam: Constitutional: NAD General: frail appearing, thin, pleasant elderly female EYES: lids intact ENMT: oral mucous membranes moist CV: S1S2,  RRR Pulmonary: LCTA, no increased work of breathing, no cough, room air Abdomen:  normo-active BS + 4 quadrants, soft and non tender MSK: ambulatory with walker; muscle wasting Skin: warm and dry; right lower leg multiple healing wounds Neuro:  + generalized weakness,  + cognitive impairment Psych: non-anxious affect, A and O x 2 Thank you for the  opportunity to participate in the care of [name].  The palliative care team will continue to follow. Please call our office at [phone] if we can be of additional assistance  Questions and concerns were addressed. The patient/family was encouraged to call with questions and/or concerns. My business card was provided. Provided general support and encouragement, no other unmet needs identified   This chart was dictated using voice recognition software.  Despite best efforts to proofread,  errors can occur which can change the documentation meaning. .   Elieser Tetrick Z Kierre Deines, NP   COVID-19 PATIENT SCREENING TOOL Asked and negative response unless otherwise noted:   Have you had symptoms of covid, tested positive or been in contact with someone with symptoms/positive test in the past 5-10 days? NO

## 2021-08-01 ENCOUNTER — Other Ambulatory Visit: Payer: Self-pay

## 2021-08-01 ENCOUNTER — Ambulatory Visit (INDEPENDENT_AMBULATORY_CARE_PROVIDER_SITE_OTHER): Payer: Medicare Other | Admitting: Podiatry

## 2021-08-01 ENCOUNTER — Encounter: Payer: Self-pay | Admitting: Podiatry

## 2021-08-01 VITALS — Temp 98.0°F

## 2021-08-01 DIAGNOSIS — L97512 Non-pressure chronic ulcer of other part of right foot with fat layer exposed: Secondary | ICD-10-CM | POA: Diagnosis not present

## 2021-08-01 DIAGNOSIS — M79674 Pain in right toe(s): Secondary | ICD-10-CM

## 2021-08-01 DIAGNOSIS — B351 Tinea unguium: Secondary | ICD-10-CM | POA: Diagnosis not present

## 2021-08-01 DIAGNOSIS — M79675 Pain in left toe(s): Secondary | ICD-10-CM

## 2021-08-01 DIAGNOSIS — L989 Disorder of the skin and subcutaneous tissue, unspecified: Secondary | ICD-10-CM

## 2021-08-01 DIAGNOSIS — E0843 Diabetes mellitus due to underlying condition with diabetic autonomic (poly)neuropathy: Secondary | ICD-10-CM

## 2021-08-01 NOTE — Progress Notes (Signed)
° °  HPI: 88 y.o. female presenting today for follow-up evaluation of ulcers that developed to the right foot and ankle.  Last visit doxycycline was prescribed and she has been applying gentamicin cream daily.  There has been significant improvement.  Patient is also requesting a nail trim today.  She has thick dystrophic nails and she is unable to trim her own toenails.  Past Medical History:  Diagnosis Date   Allergy    Chicken pox    CVA (cerebral vascular accident) ([place])    Diabetes mellitus ([place])    Hypercholesterolemia    Hypertension     Past Surgical History:  Procedure Laterality Date   ABDOMINAL HYSTERECTOMY  1971   excessive bleeding   APPENDECTOMY  age 82   BREAST LUMPECTOMY  1958   benign   DILATION AND CURETTAGE OF UTERUS  1970    Allergies  Allergen Reactions   Actos [Pioglitazone] Swelling   Contrast Media [Iodinated Contrast Media] Swelling   Prandin [Repaglinide] Swelling   Pravastatin Sodium     cramps        Physical Exam: General: The patient is alert and oriented x3 in no acute distress.  Dermatology: Hyperkeratotic dystrophic nails noted to the toes 1-5 bilateral  Ulcer noted to the lateral aspect of the fibular malleolus right and posterior ankle right.  Please see above noted pictures.  The erythema and drainage has improved significantly.  Wound base is granular with some fibrotic tissue.  Please see above noted picture.  The wounds both measure about 1.0 cm x 1.0 cm time 0.2 cm.  Vascular: Palpable pedal pulses bilaterally. Capillary refill within normal limits.    Neurological: Light touch and protective threshold grossly intact  Musculoskeletal Exam: Plantarflexed first ray.  Assessment: 1.  Ulcer/wound right lateral ankle x2 secondary to fall injury.  DOI: 05/15/2021 2.  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  Pain due to onychomycosis of toenails both   Plan of Care:  1. Patient evaluated. X-Rays reviewed.  2.   Mechanical debridement of nails 1-5 bilateral was performed using a nail nipper without incident. 3.  Medically necessary excisional debridement including subcutaneous tissue was performed using a tissue nipper.  Excisional debridement of all necrotic nonviable tissue down to healthier bleeding viable tissue was performed.  Was not too aggressive with this due to the patient being on anticoagulant medication and therapy 4.  Continue gentamicin cream daily 5.  Excisional debridement of the hyperkeratotic preulcerative callus tissue was performed to the plantar aspect of the first MTP joint left foot 6.  Return to clinic in 3 weeks     [name], DPM [facility]  Dr. [name], DPM    [address], [place] [address]                Office [phone]  Fax [phone]

## 2021-08-05 ENCOUNTER — Telehealth: Payer: Self-pay | Admitting: Pharmacy Technician

## 2021-08-05 DIAGNOSIS — Z596 Low income: Secondary | ICD-10-CM

## 2021-08-05 NOTE — Progress Notes (Signed)
[facility])                                            [place] Team    08/05/2021  Angela Patterson 1920/12/10 [id number]  Received both patient and provider portion(s) of patient assistance application(s) for Jardiance. Faxed completed application and required documents into BI.    Romona Murdy P. [name], [place]  [phone]

## 2021-08-18 ENCOUNTER — Telehealth: Payer: Self-pay

## 2021-08-18 NOTE — Telephone Encounter (Signed)
915 am.  Phone call made to patient to schedule a home visit for February per request of Christin Gusler, NP.  No answer but text message received requesting a call back later today.

## 2021-08-19 ENCOUNTER — Ambulatory Visit: Payer: Medicare Other | Admitting: Podiatry

## 2021-08-19 ENCOUNTER — Telehealth: Payer: Self-pay

## 2021-08-19 NOTE — Telephone Encounter (Signed)
11 am.  Request from Natalia Leatherwood, NP to schedule a home visit with patient in February.  Spoke with daughter and visit is scheduled for 2/14 @ 1 pm.

## 2021-08-21 ENCOUNTER — Emergency Department: Payer: Medicare Other

## 2021-08-21 ENCOUNTER — Other Ambulatory Visit: Payer: Self-pay

## 2021-08-21 ENCOUNTER — Inpatient Hospital Stay
Admission: EM | Admit: 2021-08-21 | Discharge: 2021-08-25 | DRG: 605 | Disposition: A | Discharge status: Skilled Nursing Facility | Payer: Medicare Other | Attending: Internal Medicine | Admitting: Internal Medicine

## 2021-08-21 DIAGNOSIS — R52 Pain, unspecified: Secondary | ICD-10-CM | POA: Diagnosis not present

## 2021-08-21 DIAGNOSIS — Z823 Family history of stroke: Secondary | ICD-10-CM | POA: Diagnosis not present

## 2021-08-21 DIAGNOSIS — W19XXXA Unspecified fall, initial encounter: Secondary | ICD-10-CM

## 2021-08-21 DIAGNOSIS — Z8619 Personal history of other infectious and parasitic diseases: Secondary | ICD-10-CM

## 2021-08-21 DIAGNOSIS — M79604 Pain in right leg: Secondary | ICD-10-CM | POA: Diagnosis not present

## 2021-08-21 DIAGNOSIS — E1169 Type 2 diabetes mellitus with other specified complication: Secondary | ICD-10-CM

## 2021-08-21 DIAGNOSIS — Z8673 Personal history of transient ischemic attack (TIA), and cerebral infarction without residual deficits: Secondary | ICD-10-CM | POA: Diagnosis not present

## 2021-08-21 DIAGNOSIS — R9431 Abnormal electrocardiogram [ECG] [EKG]: Secondary | ICD-10-CM | POA: Diagnosis not present

## 2021-08-21 DIAGNOSIS — E1165 Type 2 diabetes mellitus with hyperglycemia: Secondary | ICD-10-CM | POA: Diagnosis present

## 2021-08-21 DIAGNOSIS — Z90710 Acquired absence of both cervix and uterus: Secondary | ICD-10-CM

## 2021-08-21 DIAGNOSIS — S0003XA Contusion of scalp, initial encounter: Secondary | ICD-10-CM | POA: Diagnosis not present

## 2021-08-21 DIAGNOSIS — Z79899 Other long term (current) drug therapy: Secondary | ICD-10-CM

## 2021-08-21 DIAGNOSIS — Z833 Family history of diabetes mellitus: Secondary | ICD-10-CM | POA: Diagnosis not present

## 2021-08-21 DIAGNOSIS — Y92000 Kitchen of unspecified non-institutional (private) residence as  the place of occurrence of the external cause: Secondary | ICD-10-CM

## 2021-08-21 DIAGNOSIS — D696 Thrombocytopenia, unspecified: Secondary | ICD-10-CM | POA: Diagnosis present

## 2021-08-21 DIAGNOSIS — E782 Mixed hyperlipidemia: Secondary | ICD-10-CM | POA: Diagnosis not present

## 2021-08-21 DIAGNOSIS — E119 Type 2 diabetes mellitus without complications: Secondary | ICD-10-CM

## 2021-08-21 DIAGNOSIS — W1830XA Fall on same level, unspecified, initial encounter: Secondary | ICD-10-CM | POA: Diagnosis present

## 2021-08-21 DIAGNOSIS — Z7902 Long term (current) use of antithrombotics/antiplatelets: Secondary | ICD-10-CM

## 2021-08-21 DIAGNOSIS — F03918 Unspecified dementia, unspecified severity, with other behavioral disturbance: Secondary | ICD-10-CM | POA: Diagnosis present

## 2021-08-21 DIAGNOSIS — I700 Atherosclerosis of aorta: Secondary | ICD-10-CM | POA: Diagnosis not present

## 2021-08-21 DIAGNOSIS — Z9049 Acquired absence of other specified parts of digestive tract: Secondary | ICD-10-CM

## 2021-08-21 DIAGNOSIS — Z7984 Long term (current) use of oral hypoglycemic drugs: Secondary | ICD-10-CM

## 2021-08-21 DIAGNOSIS — R1312 Dysphagia, oropharyngeal phase: Secondary | ICD-10-CM | POA: Diagnosis not present

## 2021-08-21 DIAGNOSIS — E7800 Pure hypercholesterolemia, unspecified: Secondary | ICD-10-CM | POA: Diagnosis present

## 2021-08-21 DIAGNOSIS — L97512 Non-pressure chronic ulcer of other part of right foot with fat layer exposed: Secondary | ICD-10-CM | POA: Diagnosis present

## 2021-08-21 DIAGNOSIS — S0181XA Laceration without foreign body of other part of head, initial encounter: Secondary | ICD-10-CM | POA: Diagnosis present

## 2021-08-21 DIAGNOSIS — H9190 Unspecified hearing loss, unspecified ear: Secondary | ICD-10-CM | POA: Diagnosis present

## 2021-08-21 DIAGNOSIS — Z7401 Bed confinement status: Secondary | ICD-10-CM | POA: Diagnosis not present

## 2021-08-21 DIAGNOSIS — S81811A Laceration without foreign body, right lower leg, initial encounter: Principal | ICD-10-CM

## 2021-08-21 DIAGNOSIS — E440 Moderate protein-calorie malnutrition: Secondary | ICD-10-CM | POA: Diagnosis present

## 2021-08-21 DIAGNOSIS — R2681 Unsteadiness on feet: Secondary | ICD-10-CM | POA: Diagnosis not present

## 2021-08-21 DIAGNOSIS — S01119A Laceration without foreign body of unspecified eyelid and periocular area, initial encounter: Secondary | ICD-10-CM

## 2021-08-21 DIAGNOSIS — Z681 Body mass index (BMI) 19 or less, adult: Secondary | ICD-10-CM | POA: Diagnosis not present

## 2021-08-21 DIAGNOSIS — R498 Other voice and resonance disorders: Secondary | ICD-10-CM | POA: Diagnosis not present

## 2021-08-21 DIAGNOSIS — Z9181 History of falling: Secondary | ICD-10-CM

## 2021-08-21 DIAGNOSIS — Z20822 Contact with and (suspected) exposure to covid-19: Secondary | ICD-10-CM | POA: Diagnosis present

## 2021-08-21 DIAGNOSIS — S8991XA Unspecified injury of right lower leg, initial encounter: Secondary | ICD-10-CM | POA: Diagnosis not present

## 2021-08-21 DIAGNOSIS — I480 Paroxysmal atrial fibrillation: Secondary | ICD-10-CM

## 2021-08-21 DIAGNOSIS — I10 Essential (primary) hypertension: Secondary | ICD-10-CM

## 2021-08-21 DIAGNOSIS — I4510 Unspecified right bundle-branch block: Secondary | ICD-10-CM | POA: Diagnosis present

## 2021-08-21 DIAGNOSIS — Z043 Encounter for examination and observation following other accident: Secondary | ICD-10-CM | POA: Diagnosis not present

## 2021-08-21 DIAGNOSIS — M47812 Spondylosis without myelopathy or radiculopathy, cervical region: Secondary | ICD-10-CM | POA: Diagnosis not present

## 2021-08-21 DIAGNOSIS — H409 Unspecified glaucoma: Secondary | ICD-10-CM

## 2021-08-21 DIAGNOSIS — Z8249 Family history of ischemic heart disease and other diseases of the circulatory system: Secondary | ICD-10-CM

## 2021-08-21 DIAGNOSIS — Z888 Allergy status to other drugs, medicaments and biological substances status: Secondary | ICD-10-CM

## 2021-08-21 DIAGNOSIS — Z66 Do not resuscitate: Secondary | ICD-10-CM | POA: Diagnosis present

## 2021-08-21 DIAGNOSIS — E11621 Type 2 diabetes mellitus with foot ulcer: Secondary | ICD-10-CM | POA: Diagnosis present

## 2021-08-21 DIAGNOSIS — M6259 Muscle wasting and atrophy, not elsewhere classified, multiple sites: Secondary | ICD-10-CM | POA: Diagnosis not present

## 2021-08-21 DIAGNOSIS — R404 Transient alteration of awareness: Secondary | ICD-10-CM | POA: Diagnosis not present

## 2021-08-21 DIAGNOSIS — Z736 Limitation of activities due to disability: Secondary | ICD-10-CM | POA: Diagnosis not present

## 2021-08-21 DIAGNOSIS — Y92009 Unspecified place in unspecified non-institutional (private) residence as the place of occurrence of the external cause: Secondary | ICD-10-CM | POA: Diagnosis not present

## 2021-08-21 DIAGNOSIS — G319 Degenerative disease of nervous system, unspecified: Secondary | ICD-10-CM | POA: Diagnosis not present

## 2021-08-21 DIAGNOSIS — R739 Hyperglycemia, unspecified: Secondary | ICD-10-CM | POA: Diagnosis not present

## 2021-08-21 DIAGNOSIS — R488 Other symbolic dysfunctions: Secondary | ICD-10-CM | POA: Diagnosis not present

## 2021-08-21 DIAGNOSIS — M549 Dorsalgia, unspecified: Secondary | ICD-10-CM | POA: Diagnosis not present

## 2021-08-21 DIAGNOSIS — R911 Solitary pulmonary nodule: Secondary | ICD-10-CM | POA: Diagnosis not present

## 2021-08-21 DIAGNOSIS — R5381 Other malaise: Secondary | ICD-10-CM | POA: Diagnosis not present

## 2021-08-21 DIAGNOSIS — Z91041 Radiographic dye allergy status: Secondary | ICD-10-CM

## 2021-08-21 DIAGNOSIS — F3289 Other specified depressive episodes: Secondary | ICD-10-CM | POA: Diagnosis not present

## 2021-08-21 LAB — COMPREHENSIVE METABOLIC PANEL
ALT: 24 U/L (ref 0–44)
AST: 33 U/L (ref 15–41)
Albumin: 3.6 g/dL (ref 3.5–5.0)
Alkaline Phosphatase: 72 U/L (ref 38–126)
Anion gap: 7 (ref 5–15)
BUN: 21 mg/dL (ref 8–23)
CO2: 26 mmol/L (ref 22–32)
Calcium: 8.9 mg/dL (ref 8.9–10.3)
Chloride: 100 mmol/L (ref 98–111)
Creatinine, Ser: 0.8 mg/dL (ref 0.44–1.00)
GFR, Estimated: >60 mL/min (ref >60)
Glucose, Bld: 275 mg/dL — ABNORMAL HIGH (ref 70–99)
Potassium: 4.1 mmol/L (ref 3.5–5.1)
Sodium: 133 mmol/L — ABNORMAL LOW (ref 135–145)
Total Bilirubin: 0.9 mg/dL (ref 0.3–1.2)
Total Protein: 7.1 g/dL (ref 6.5–8.1)

## 2021-08-21 LAB — CBC WITH DIFFERENTIAL/PLATELET
Abs Immature Granulocytes: 0.06 10*3/uL (ref 0.00–0.07)
Basophils Absolute: 0 10*3/uL (ref 0.0–0.1)
Basophils Relative: 0 %
Eosinophils Absolute: 0.2 10*3/uL (ref 0.0–0.5)
Eosinophils Relative: 2 %
HCT: 42.8 % (ref 36.0–46.0)
Hemoglobin: 13.8 g/dL (ref 12.0–15.0)
Immature Granulocytes: 1 %
Lymphocytes Relative: 14 %
Lymphs Abs: 1.4 10*3/uL (ref 0.7–4.0)
MCH: 31.2 pg (ref 26.0–34.0)
MCHC: 32.2 g/dL (ref 30.0–36.0)
MCV: 96.6 fL (ref 80.0–100.0)
Monocytes Absolute: 0.9 10*3/uL (ref 0.1–1.0)
Monocytes Relative: 8 %
Neutro Abs: 7.9 10*3/uL — ABNORMAL HIGH (ref 1.7–7.7)
Neutrophils Relative %: 75 %
Platelets: 123 10*3/uL — ABNORMAL LOW (ref 150–400)
RBC: 4.43 MIL/uL (ref 3.87–5.11)
RDW: 12.4 % (ref 11.5–15.5)
WBC: 10.5 10*3/uL (ref 4.0–10.5)
nRBC: 0 % (ref 0.0–0.2)

## 2021-08-21 LAB — TROPONIN I (HIGH SENSITIVITY)
Troponin I (High Sensitivity): 8 ng/L (ref <18)
Troponin I (High Sensitivity): 7 ng/L (ref <18)

## 2021-08-21 LAB — GLUCOSE, CAPILLARY
Glucose-Capillary: 257 mg/dL — ABNORMAL HIGH (ref 70–99)
Glucose-Capillary: 263 mg/dL — ABNORMAL HIGH (ref 70–99)

## 2021-08-21 LAB — CBG MONITORING, ED: Glucose-Capillary: 163 mg/dL — ABNORMAL HIGH (ref 70–99)

## 2021-08-21 LAB — RESP PANEL BY RT-PCR (FLU A&B, COVID) ARPGX2
Influenza A by PCR: NEGATIVE
Influenza B by PCR: NEGATIVE
SARS Coronavirus 2 by RT PCR: NEGATIVE

## 2021-08-21 MED ORDER — ADULT MULTIVITAMIN W/MINERALS CH
1.0000 | ORAL_TABLET | Freq: Every day | ORAL | Status: DC
  Administered 2021-08-21 – 2021-08-25 (×5): 1 via ORAL
  Filled 2021-08-21 (×5): qty 1

## 2021-08-21 MED ORDER — AMLODIPINE BESYLATE 5 MG PO TABS
5.0000 mg | ORAL_TABLET | Freq: Every day | ORAL | Status: DC
  Administered 2021-08-21 – 2021-08-25 (×5): 5 mg via ORAL
  Filled 2021-08-21 (×5): qty 1

## 2021-08-21 MED ORDER — LIDOCAINE-EPINEPHRINE 2 %-1:100000 IJ SOLN
20.0000 mL | Freq: Once | INTRAMUSCULAR | Status: AC
  Administered 2021-08-21: 20 mL
  Filled 2021-08-21: qty 1

## 2021-08-21 MED ORDER — ONDANSETRON HCL 4 MG PO TABS: 4.0000 mg | ORAL_TABLET | Freq: Four times a day (QID) | ORAL | Status: DC | PRN

## 2021-08-21 MED ORDER — SERTRALINE HCL 50 MG PO TABS
50.0000 mg | ORAL_TABLET | Freq: Every day | ORAL | Status: DC
  Administered 2021-08-21 – 2021-08-24 (×4): 50 mg via ORAL
  Filled 2021-08-21 (×4): qty 1

## 2021-08-21 MED ORDER — LORAZEPAM 2 MG/ML IJ SOLN
1.0000 mg | Freq: Once | INTRAMUSCULAR | Status: AC
  Administered 2021-08-21: 1 mg via INTRAVENOUS
  Filled 2021-08-21: qty 1

## 2021-08-21 MED ORDER — ACETAMINOPHEN 325 MG PO TABS
650.0000 mg | ORAL_TABLET | Freq: Four times a day (QID) | ORAL | Status: DC | PRN
  Administered 2021-08-21 – 2021-08-24 (×3): 650 mg via ORAL
  Filled 2021-08-21 (×3): qty 2

## 2021-08-21 MED ORDER — SODIUM CHLORIDE 0.9 % IV BOLUS
500.0000 mL | Freq: Once | INTRAVENOUS | Status: AC
Start: 2021-08-21 — End: 2021-08-21
  Administered 2021-08-21: 500 mL via INTRAVENOUS

## 2021-08-21 MED ORDER — ENSURE ENLIVE PO LIQD
237.0000 mL | Freq: Two times a day (BID) | ORAL | Status: DC
  Administered 2021-08-21 – 2021-08-24 (×5): 237 mL via ORAL

## 2021-08-21 MED ORDER — ACETAMINOPHEN 650 MG RE SUPP: 650.0000 mg | Freq: Four times a day (QID) | RECTAL | Status: DC | PRN

## 2021-08-21 MED ORDER — INSULIN ASPART 100 UNIT/ML IJ SOLN
0.0000 [IU] | Freq: Three times a day (TID) | INTRAMUSCULAR | Status: DC
  Filled 2021-08-21 (×3): qty 1

## 2021-08-21 MED ORDER — EMPAGLIFLOZIN 25 MG PO TABS
25.0000 mg | ORAL_TABLET | Freq: Every day | ORAL | Status: DC
  Administered 2021-08-21 – 2021-08-25 (×5): 25 mg via ORAL
  Filled 2021-08-21 (×6): qty 1

## 2021-08-21 MED ORDER — ONDANSETRON HCL 4 MG/2ML IJ SOLN: 4.0000 mg | Freq: Four times a day (QID) | INTRAMUSCULAR | Status: DC | PRN

## 2021-08-21 MED ORDER — SODIUM CHLORIDE 0.9% FLUSH
3.0000 mL | Freq: Two times a day (BID) | INTRAVENOUS | Status: DC
  Administered 2021-08-21 – 2021-08-24 (×7): 3 mL via INTRAVENOUS

## 2021-08-21 MED ORDER — TIMOLOL MALEATE 0.5 % OP SOLN
1.0000 [drp] | Freq: Every day | OPHTHALMIC | Status: DC
  Administered 2021-08-22 – 2021-08-25 (×4): 1 [drp] via OPHTHALMIC
  Filled 2021-08-21: qty 5

## 2021-08-21 MED ORDER — CLOPIDOGREL BISULFATE 75 MG PO TABS
75.0000 mg | ORAL_TABLET | Freq: Every day | ORAL | Status: DC
  Administered 2021-08-21 – 2021-08-24 (×3): 75 mg via ORAL
  Filled 2021-08-21 (×3): qty 1

## 2021-08-21 NOTE — H&P (Signed)
History and Physical    Patient: Angela Patterson SWN:[id number] DOB: [birth year] DOA: 08/21/2021 DOS: the patient was seen and examined on 08/21/2021 PCP: Einar Pheasant, MD  Patient coming from: Home  Chief Complaint:  Chief Complaint  Patient presents with   Fall    HPI: Angela Patterson is a 88 y.o. female with medical history significant for DM, HTN, prior CVA, glaucoma, hearing impaired, chronic ulcer right foot followed by podiatry, history of frequent falls, thrombocytopenia who presented to the ED following a fall.  Patient states she went to the kitchen to get up glass of water and while she was standing that she fell and was unable to get up.  She did not lose consciousness, and had to scoot on the floor from the bathroom to the bedroom to get to telephone when she was able to call her daughter who found on the floor with trail of blood.  Patient sustained a laceration to the forehead and the right shin.  She denies other complaints.  She was not recently ill.  Patient has a history of frequent falls and at baseline ambulates with a walker but often does not use it.  Daughter at bedside contributes to the history.  ED course: Vitals within normal limits except for slightly elevated blood pressure of 159/73 Blood work: Glucose 275, platelets 123, troponin 8.  CMP and CBC unremarkable  EKG, personally viewed and interpreted: A-fib with RVR with lots of artifact pending repeat  Imaging: Trauma imaging with CT C-spine and head, right tib-fib and chest x-ray all negative for acute injury and nonacute  Patient underwent laceration repair of the forehead.  Hospitalist consulted for admission for possible syncope work-up and overnight observation.  Review of Systems: As mentioned in the history of present illness. All other systems reviewed and are negative. Past Medical History:  Diagnosis Date   Allergy    Chicken pox    CVA (cerebral vascular accident) ([place])    Diabetes mellitus ([place])     Hypercholesterolemia    Hypertension    Past Surgical History:  Procedure Laterality Date   ABDOMINAL HYSTERECTOMY  1971   excessive bleeding   APPENDECTOMY  age 72   BREAST LUMPECTOMY  1958   benign   [place]   Social History:  reports that she has never smoked. She has never used smokeless tobacco. She reports that she does not drink alcohol and does not use drugs.  Allergies  Allergen Reactions   Actos [Pioglitazone] Swelling   Contrast Media [Iodinated Contrast Media] Swelling   Prandin [Repaglinide] Swelling   Pravastatin Sodium     cramps    Family History  Problem Relation Age of Onset   Liver cancer Father    Stroke Mother    Heart attack Brother    Hypertension Daughter    Hypertension Son    Hypertension Daughter    Cancer Grandchild        breast   Diabetes Grandchild    Breast cancer Neg Hx    Colon cancer Neg Hx     Prior to Admission medications   Medication Sig Start Date End Date Taking? Authorizing Provider  amLODipine (NORVASC) 5 MG tablet TAKE 1 TABLET(5 MG) BY MOUTH TWICE DAILY 04/17/21   Einar Pheasant, MD  Cholecalciferol (VITAMIN D) 2000 units tablet Take 2,000 Units by mouth daily.     [provider]  clopidogrel (PLAVIX) 75 MG tablet TAKE 1 TABLET(75 MG) BY MOUTH  DAILY 04/21/21   Einar Pheasant, MD  Cranberry 500 MG CHEW Chew 1 each by mouth daily.    [provider]  doxycycline (VIBRA-TABS) 100 MG tablet Take 1 tablet (100 mg total) by mouth 2 (two) times daily. 07/18/21   [name], DPM  empagliflozin (JARDIANCE) 25 MG TABS tablet Take 1 tablet (25 mg total) by mouth daily. 01/22/21   Einar Pheasant, MD  gentamicin cream (GARAMYCIN) 0.1 % Apply 1 application topically 2 (two) times daily. 07/18/21   [name], DPM  glucose blood test strip OneTouch Ultra Test strips    [provider]  glucose blood test strip USE TO CHECK BLOOD SUGAR  TWO TIMES DAILY. Dx E11.9 01/17/21    Einar Pheasant, MD  Lancet Devices (ONE TOUCH DELICA LANCING DEV) MISC Use twice daily Dx: 250.00 10/30/13   Einar Pheasant, MD  Multiple Vitamin (MULTIVITAMIN) tablet Take 1 tablet by mouth daily.    [provider]  OMEGA-3 FATTY ACIDS PO Take 720 mg by mouth daily.    [provider]  rosuvastatin (CRESTOR) 5 MG tablet TAKE 1 TABLET(5 MG) BY MOUTH 2 TIMES A WEEK 03/14/21   Einar Pheasant, MD  sertraline (ZOLOFT) 25 MG tablet Take 1-2 tablets (25-50 mg total) by mouth daily. Patient taking differently: Take 25 mg by mouth daily. 03/14/21   Einar Pheasant, MD  timolol (BETIMOL) 0.5 % ophthalmic solution 1 drop daily.    [provider]    Physical Exam: Vitals:   08/21/21 0230 08/21/21 0245 08/21/21 0300 08/21/21 0315  BP: (!) 142/75  (!) 159/73   Pulse:      Resp: 18 15 (!) 21 18  Temp:      TempSrc:      SpO2:       Physical Exam HENT:     Head: Laceration present. No contusion.     Comments: Hematoma frontal scalp with laceration. Dry bandage Cardiovascular:     Rate and Rhythm: Regular rhythm.     Pulses: Normal pulses.     Heart sounds: Normal heart sounds.  Pulmonary:     Effort: Pulmonary effort is normal.     Breath sounds: Normal breath sounds.  Abdominal:     General: Abdomen is flat.     Palpations: Abdomen is soft.  Musculoskeletal:        General: Normal range of motion.     Cervical back: No pain with movement or spinous process tenderness.  Skin:    General: Skin is warm and dry.     Findings: Bruising and lesion present.     Comments: Small ulcer right lateral malleolus. Right lower leg in unsoiled bandage  Neurological:     General: No focal deficit present.     Mental Status: She is alert. Mental status is at baseline.  Psychiatric:        Mood and Affect: Mood normal.        Behavior: Behavior normal.     Data Reviewed: ED providers note, triage note, past records from PCP and Palliative care and Podiatry, vitals,  labs, EKG, images  Assessment and Plan: * Fall at home, initial encounter Suspect mechanical fall based on history Daughter reports a history of prior falls No acute neurologic deficit and CT head negative Daughter declines additional work-up for possible syncope or TIA/stroke Telemetry to evaluate for arrhythmias, due to possible A-fib Fall precautions PT/OT eval Daughter plans to get 24-hour care from mother for at least the  next 48 hours once discharged but willing to talk to case management  Paroxysmal atrial fibrillation ([place]) EKG on arrival showed A-fib at 164, with no prior history of A-fib Rate is now controlled without intervention IV hydration Discussed risks and benefits of anticoagulation for stroke prevention with daughter and given frequent falls, daughter declines systemic anticoagulation. Cardiac monitoring  Laceration of right lower leg Secondary to fall Wound care  Laceration of eyebrow and forehead, initial encounter Sutured in the ED Given blood loss as described by daughter will give NS 500 mls Wound care  Thrombocytopenia ([place])- (present on admission) Chronic and stable  History of CVA (cerebrovascular accident) History of TIA many years ago, per daughter at bedside Continue Plavix and rosuvastatin  Ulcer of right foot with fat layer exposed ([place]) Sustained following a fall several months prior Followed by podiatry.  Last seen 08/01/2021 Wound care  Glaucoma Continue home timolol ophthalmic solution  Hearing impairment Attend to communication needs  Type 2 diabetes mellitus (HCC) Sliding scale insulin coverage for now  Essential hypertension- (present on admission) Continue home antihypertensives, amlodipine    Advance Care Planning:   Code Status: Prior DNR  Consults: none  Family Communication: Daughter at bedside  Severity of Illness: The appropriate patient status for this patient is OBSERVATION. Observation status is judged to be  reasonable and necessary in order to provide the required intensity of service to ensure the patient's safety. The patient's presenting symptoms, physical exam findings, and initial radiographic and laboratory data in the context of their medical condition is felt to place them at decreased risk for further clinical deterioration. Furthermore, it is anticipated that the patient will be medically stable for discharge from the hospital within 2 midnights of admission.   SCD for DVT prophylaxis  Author: Athena Masse, MD 08/21/2021 3:45 AM  For on call review [email or web address].

## 2021-08-21 NOTE — Assessment & Plan Note (Signed)
Continue home antihypertensives, amlodipine

## 2021-08-21 NOTE — Assessment & Plan Note (Signed)
EKG on arrival showed A-fib at 164, with no prior history of A-fib Rate is now controlled without intervention IV hydration Discussed risks and benefits of anticoagulation for stroke prevention with daughter and given frequent falls, daughter declines systemic anticoagulation. Cardiac monitoring

## 2021-08-21 NOTE — Assessment & Plan Note (Addendum)
Sutured in the ED Given blood loss as described by daughter will give NS 500 mls Wound care

## 2021-08-21 NOTE — Assessment & Plan Note (Addendum)
Suspect mechanical fall based on history Daughter reports a history of prior falls No acute neurologic deficit and CT head negative Daughter declines additional work-up for possible syncope or TIA/stroke Telemetry to evaluate for arrhythmias, due to possible A-fib Fall precautions PT/OT eval Daughter plans to get 24-hour care from mother for at least the next 48 hours once discharged but willing to talk to case management

## 2021-08-21 NOTE — Evaluation (Signed)
Occupational Therapy Evaluation Patient Details Name: Angela Patterson MRN: [id number] DOB: 30-Jul-1920 Today's Date: 08/21/2021   History of Present Illness Pt is a 88 yo female that presented to the ED from a fall, admitted for observation for potential syncopal workup. PMH of CVA, DM, HTN, hearing impaired, frequent falls, thrombocytopenia, and chronic R foot ulcer.   Clinical Impression   Patient presenting with decreased Ind in self care, balance, functional mobility/transfers, endurance, and safety awareness. Patient lives at home alone with support from family for IADLs and ADLs as needed. Family assists pt with shower for safety. She utilizes three wheeled walker at home for mobility. She is able to dress herself and they check in on her frequently. They reported plans to have additional assist/supervision for pt at discharge. Patient currently functioning at min guard - min A with RW for toileting needs and hygiene.  Patient will benefit from acute OT to increase overall independence in the areas of ADLs, functional mobility, and safety awareness in order to safely discharge home.     Recommendations for follow up therapy are one component of a multi-disciplinary discharge planning process, led by the attending physician.  Recommendations may be updated based on patient status, additional functional criteria and insurance authorization.   Follow Up Recommendations  Home health OT    Assistance Recommended at Discharge Intermittent Supervision/Assistance  Patient can return home with the following A little help with walking and/or transfers;A little help with bathing/dressing/bathroom;Assistance with cooking/housework;Direct supervision/assist for medications management;Direct supervision/assist for financial management;Help with stairs or ramp for entrance    Functional Status Assessment  Patient has had a recent decline in their functional status and demonstrates the ability to make  significant improvements in function in a reasonable and predictable amount of time.  Equipment Recommendations  None recommended by OT       Precautions / Restrictions Precautions Precautions: Fall Restrictions Weight Bearing Restrictions: No      Mobility Bed Mobility Overal bed mobility: Needs Assistance Bed Mobility: Sit to Supine       Sit to supine: Supervision   General bed mobility comments: increased time    Transfers Overall transfer level: Needs assistance Equipment used: Rolling walker (2 wheels) Transfers: Sit to/from Stand Sit to Stand: Min guard                  Balance Overall balance assessment: Needs assistance Sitting-balance support: Bilateral upper extremity supported Sitting balance-Leahy Scale: Good     Standing balance support: Bilateral upper extremity supported, During functional activity, Reliant on assistive device for balance Standing balance-Leahy Scale: Fair                             ADL either performed or assessed with clinical judgement   ADL Overall ADL's : Needs assistance/impaired                         Toilet Transfer: Minimal Teacher, English as a foreign language;Ambulation;Rolling walker (2 wheels)   Toileting- Clothing Manipulation and Hygiene: Min guard;Sit to/from stand               Vision Patient Visual Report: No change from baseline              Pertinent Vitals/Pain Pain Assessment Pain Assessment: No/denies pain        Extremity/Trunk Assessment Upper Extremity Assessment Upper Extremity Assessment: Overall WFL for tasks assessed   Lower Extremity  Assessment Lower Extremity Assessment: Overall WFL for tasks assessed       Communication Communication Communication: HOH   Cognition Arousal/Alertness: Awake/alert Behavior During Therapy: WFL for tasks assessed/performed Overall Cognitive Status: Within Functional Limits for tasks assessed                                  General Comments: Pt HOH, but oriented to self, family in the room. Pt is plesasant and cooperative throughout                [place] expects to be discharged to:: Private residence Living Arrangements: Alone Available Help at Discharge: Family;Available PRN/intermittently Type of Home: House Home Access: Stairs to enter [employer] of Steps: 2 Entrance Stairs-Rails: Right Home Layout: One level     Bathroom Shower/Tub: Teacher, early years/pre: Handicapped height Bathroom Accessibility: No   Home Equipment: Other (comment);Tub bench;BSC/3in1 (3 wheeled walker)          Prior Functioning/Environment Prior Level of Function : Needs assist       Physical Assist : ADLs (physical)   ADLs (physical): Bathing;Grooming;Dressing;IADLs   ADLs Comments: daughter and niece assist with shower and pt can dress herself        OT Problem List: Decreased strength;Decreased range of motion;Decreased activity tolerance;Impaired balance (sitting and/or standing);Decreased safety awareness;Decreased knowledge of use of DME or AE      OT Treatment/Interventions: Self-care/ADL training;Therapeutic exercise;Therapeutic activities;Energy conservation;Visual/perceptual remediation/compensation;DME and/or AE instruction;Patient/family education;Balance training    OT Goals(Current goals can be found in the care plan section) Acute Rehab OT Goals Patient Stated Goal: to return home OT Goal Formulation: With patient/family Time For Goal Achievement: 09/04/21 Potential to Achieve Goals: Good ADL Goals Pt Will Perform Grooming: with modified independence;standing Pt Will Perform Lower Body Dressing: with modified independence;sit to/from stand Pt Will Transfer to Toilet: with modified independence;ambulating Pt Will Perform Toileting - Clothing Manipulation and hygiene: with modified independence;sit to/from stand  OT Frequency: Min  2X/week       AM-PAC OT "6 Clicks" Daily Activity     Outcome Measure Help from another person eating meals?: None Help from another person taking care of personal grooming?: A Little Help from another person toileting, which includes using toliet, bedpan, or urinal?: A Little Help from another person bathing (including washing, rinsing, drying)?: A Little Help from another person to put on and taking off regular upper body clothing?: A Little Help from another person to put on and taking off regular lower body clothing?: A Little 6 Click Score: 19   End of Session Equipment Utilized During Treatment: Rolling walker (2 wheels) Nurse Communication: Mobility status  Activity Tolerance: Patient tolerated treatment well;Patient limited by fatigue Patient left: in bed;with call bell/phone within reach;with bed alarm set  OT Visit Diagnosis: Unsteadiness on feet (R26.81);Repeated falls (R29.6);Muscle weakness (generalized) (M62.81)                Time: 1025-8527 OT Time Calculation (min): 22 min Charges:  OT General Charges $OT Visit: 1 Visit OT Evaluation $OT Eval Moderate Complexity: 1 Mod OT Treatments $Self Care/Home Management : 8-22 mins  Darleen Crocker, MS, OTR/L , CBIS ascom [phone]  08/21/21, 4:18 PM

## 2021-08-21 NOTE — Care Management Obs Status (Signed)
[facility] NOTIFICATION   Patient Details  Name: Angela Patterson MRN: [id number] Date of Birth: 1920-07-26   Medicare Observation Status Notification Given:  Yes    Beverly Sessions, RN 08/21/2021, 3:19 PM

## 2021-08-21 NOTE — TOC Initial Note (Addendum)
Transition of Care [facility]) - Initial/Assessment Note    Patient Details  Name: Angela Patterson MRN: [id number] Date of Birth: [birth year]  [facility]) CM/SW Contact:    Beverly Sessions, RN Phone Number: 08/21/2021, 3:04 PM  Clinical Narrative:                  Admitted BPZ:WCHE at home Admitted from:Home.  Lives alone.  Family rotates providing care. Patient is alone 3pm - till morning NID:[email or web address] [facility] Current home health/prior home health/DME: 3 wheel walker  PT eval pending.  Daughter at bedside.  Daughter prefers for patient not to go to SNF if they are able to. Daughter notified if SNF is the disposition then patient would have to go to SNF participating in the 3 day medicare waiver, patient currently in observation status  Confirmed with [facility] that they are still accepting 3 day waiver.  Daughter states she would not be interested in [facility]   If appropriate for home health daughter states they would like to use [facility]  Expected Discharge Plan: [place]     Patient Goals and CMS Choice     Choice offered to / list presented to : Patient  Expected Discharge Plan and Services Expected Discharge Plan: [place] Acute Care Choice: Durable Medical Equipment Living arrangements for the past 2 months: Single Family Home                                      Prior Living Arrangements/Services Living arrangements for the past 2 months: Single Family Home Lives with:: Self Patient language and need for interpreter reviewed:: Yes Do you feel safe going back to the place where you live?: Yes      Need for Family Participation in Patient Care: Yes (Comment) Care giver support system in place?: Yes (comment)   Criminal Activity/Legal Involvement Pertinent to Current Situation/Hospitalization: No - Comment as needed  Activities of Daily Living Home Assistive  Devices/Equipment: CBG Meter, Built-in shower seat, Walker (specify type) ADL Screening (condition at time of admission) Patient's cognitive ability adequate to safely complete daily activities?: Yes Is the patient deaf or have difficulty hearing?: No Does the patient have difficulty seeing, even when wearing glasses/contacts?: No Does the patient have difficulty concentrating, remembering, or making decisions?: Yes Patient able to express need for assistance with ADLs?: Yes Does the patient have difficulty dressing or bathing?: Yes Independently performs ADLs?: No Communication: Independent Dressing (OT): Needs assistance Is this a change from baseline?: Pre-admission baseline Feeding: Independent Bathing: Needs assistance Is this a change from baseline?: Pre-admission baseline Toileting: Needs assistance Is this a change from baseline?: Pre-admission baseline In/Out Bed: Needs assistance, Independent Walks in Home: Needs assistance Is this a change from baseline?: Pre-admission baseline Does the patient have difficulty walking or climbing stairs?: Yes Weakness of Legs: Both Weakness of Arms/Hands: Both  Permission Sought/Granted                  Emotional Assessment       Orientation: : Oriented to Self Alcohol / Substance Use: Not Applicable Psych Involvement: No (comment)  Admission diagnosis:  Fall [W19.XXXA] Fall, initial encounter [id number]] Forehead laceration, initial encounter [S01.81XA] Laceration of right lower extremity excluding thigh, initial encounter [S81.811A] Patient Active Problem List   Diagnosis  Date Noted   Glaucoma 08/21/2021   Laceration of eyebrow and forehead, initial encounter 08/21/2021   Laceration of right lower leg 08/21/2021   Paroxysmal atrial fibrillation ([place]) 08/21/2021   Swallowing difficulty 07/06/2021   Wound of right leg 06/13/2021   Abnormal urine odor 05/20/2021   Weakness 10/27/2020   Lobar pneumonia (HCC)    Lactic  acidosis    Acute metabolic encephalopathy    CAP (community acquired pneumonia) 10/18/2020   Cough 09/15/2020   Finger lesion 06/13/2020   Nasal lesion 06/13/2020   Hearing impairment 12/18/2019   Finger laceration 02/21/2019   Gait abnormality 11/13/2018   Fall at home, initial encounter 08/28/2018   Anxiety 06/22/2018   Diarrhea 12/13/2017   Rectal bleeding 07/07/2017   Ulcer of right foot with fat layer exposed ([place]) 04/18/2017   Open wound of foot excluding toes without complication 48/25/0037   Health care maintenance 10/27/2014   Weight loss 12/27/2013   Thrombocytopenia ([place]) 11/20/2012   Hypercholesterolemia 07/21/2012   History of CVA (cerebrovascular accident) 07/21/2012   Essential hypertension 07/21/2012   Pure hypercholesterolemia 07/21/2012   Type 2 diabetes mellitus ([place]) 07/21/2012   PCP:  Einar Pheasant, MD Pharmacy:   [facility] [phone] [address], [address] [place] [address] Phone: [phone] Fax: [phone]  [facility] ([place], [place] [address] [place] [place] [address] Phone: [phone] Fax: [phone]  [place], [place] - [address], [address], [place] [address] Phone: [phone] Fax: [phone]     Social Determinants of Health (SDOH) Interventions    Readmission Risk Interventions No flowsheet data found.

## 2021-08-21 NOTE — Assessment & Plan Note (Signed)
Chronic and stable.   

## 2021-08-21 NOTE — Assessment & Plan Note (Signed)
Continue home timolol ophthalmic solution

## 2021-08-21 NOTE — Assessment & Plan Note (Signed)
Sliding scale insulin coverage for now. 

## 2021-08-21 NOTE — TOC Progression Note (Signed)
Transition of Care [facility]) - Progression Note    Patient Details  Name: Angela Patterson MRN: [id number] Date of Birth: 1920-12-23  [facility]) CM/SW Contact  Beverly Sessions, RN Phone Number: 08/21/2021, 4:09 PM  Clinical Narrative:    Updated daughter on recommendation of SNF.  She declines bed search at this time, and would like to discuss with her siblings over night    Expected Discharge Plan: [place]    Expected Discharge Plan and Services Expected Discharge Plan: [place] Choice: Durable Medical Equipment Living arrangements for the past 2 months: Single Family Home                                       Social Determinants of Health (SDOH) Interventions    Readmission Risk Interventions No flowsheet data found.

## 2021-08-21 NOTE — ED Triage Notes (Signed)
Pt presents to the ETC via [facility] for a unwitnessed fall at home. Pt stated that she got out of bed and went to the kitchen to get a drink of water. At some point fell, she was able to scoot herself from bedroom, bathroom. She was able to get to a phone and call her niece who found her. Laceration to the forehead. C/o R leg pain. Skin tear to RLE. On blood thinners. EMS stated approx 200 ml of blood throughout the house. HOH. Alert and orientated to person, place.

## 2021-08-21 NOTE — Assessment & Plan Note (Signed)
Attend to communication needs

## 2021-08-21 NOTE — ED Provider Notes (Signed)
[facility] Provider Note    Event Date/Time   First MD Initiated Contact with Patient 08/21/21 [phone]     (approximate)   History   Fall   HPI  Angela Patterson is a 88 y.o. female who comes from home.  She apparently went to get some water and fell in the kitchen.  She is very hard of hearing but cannot tell us exactly why she fell.      Physical Exam   Triage Vital Signs: ED Triage Vitals  Enc Vitals Group     BP      Pulse      Resp      Temp      Temp src      SpO2      Weight      Height      Head Circumference      [place] Flow      Pain Score      Pain Loc      Pain Edu?      Excl. in [place]?     Most recent vital signs: Vitals:   08/21/21 0300 08/21/21 0315  BP: (!) 159/73   Pulse:    Resp: (!) 21 18  Temp:    SpO2:       General: Awake, alert, complains of pain in her right lower leg.  Has a dressing on her head where she cut her head. Neck supple does not appear to be tender CV:  Good peripheral perfusion.  Heart regular rate and rhythm no audible murmurs Chest: Does not appear to be tender Resp:  Normal effort.  Lungs are clear Abd:  No distention.  Soft and nontender Extremities: Arms are moving well with no apparent injury new tenderness legs patient complains of a lot of pain on when I go near the right lower leg.  She does not appear to have pain in the knees left leg thighs or the hips or the pelvis.   ED Results / Procedures / Treatments   Labs (all labs ordered are listed, but only abnormal results are displayed) Labs Reviewed  COMPREHENSIVE METABOLIC PANEL - Abnormal; Notable for the following components:      Result Value   Sodium 133 (*)    Glucose, Bld 275 (*)    All other components within normal limits  CBC WITH DIFFERENTIAL/PLATELET - Abnormal; Notable for the following components:   Platelets 123 (*)    Neutro Abs 7.9 (*)    All other components within normal limits  RESP PANEL BY RT-PCR (FLU A&B,  COVID) ARPGX2  HEMOGLOBIN A1C  TROPONIN I (HIGH SENSITIVITY)  TROPONIN I (HIGH SENSITIVITY)     EKG EKG read and interpreted by me shows what appears to be A-fib at a rate of about 64 difficult to tell because the large blocks are not present on this EKG.  The axis appears to be normal there does appear to be right bundle branch block.    RADIOLOGY  CT of the head and neck read by radiology reviewed by me is negative Chest x-ray read by radiology reviewed by me shows no acute problems Tib-fib read by radiology reviewed by me does not show a fracture there is soft tissue evidence of the laceration.  PROCEDURES:  Critical Care performed: \  Procedures wound on the leg was cleaned with Betadine anesthetized with 1% lidocaine with epinephrine and irrigated with normal saline no foreign bodies were seen.  The  wound was closed with about 6 stitches of 4-0 Prolene. Wound on the head was skin was cleaned with Betadine and irrigated with normal saline after being anesthetized with 1% lidocaine with epinephrine.  There was a small arteriolar bleed in the middle of the wound.  A figure-of-eight stitch was placed in this with Vicryl.  The wound was then brought together closing the round appearing skin defect I used 4-0 Prolene for that as well. Forehead laceration was approximately three-quarter centimeter in diameter and the leg laceration was about 3-1/2 cm long.  The leg laceration did go down into the subcu tissue.  MEDICATIONS ORDERED IN ED: Medications  sodium chloride flush (NS) 0.9 % injection 3 mL (has no administration in time range)  insulin aspart (novoLOG) injection 0-9 Units (has no administration in time range)  acetaminophen (TYLENOL) tablet 650 mg (has no administration in time range)    Or  acetaminophen (TYLENOL) suppository 650 mg (has no administration in time range)  ondansetron (ZOFRAN) tablet 4 mg (has no administration in time range)    Or  ondansetron (ZOFRAN)  injection 4 mg (has no administration in time range)  clopidogrel (PLAVIX) tablet 75 mg (has no administration in time range)  amLODipine (NORVASC) tablet 5 mg (has no administration in time range)  empagliflozin (JARDIANCE) tablet 25 mg (has no administration in time range)  sertraline (ZOLOFT) tablet 50 mg (has no administration in time range)  timolol (BETIMOL) 0.5 % ophthalmic solution 1 drop (has no administration in time range)  lidocaine-EPINEPHrine (XYLOCAINE W/EPI) 2 %-1:100000 (with pres) injection 20 mL (20 mLs Infiltration Given by Other 08/21/21 0324)  sodium chloride 0.9 % bolus 500 mL (500 mLs Intravenous New Bag/Given 08/21/21 0430)     IMPRESSION / MDM / ASSESSMENT AND PLAN / ED COURSE  I reviewed the triage vital signs and the nursing notes. Patient's son-in-law spent time with her and we cannot still figure out why she fell down or put if she passed out.  I think I will try to observe her overnight in case she did pass out. Her EKG could possibly be sinus rhythm with a very long PR this is what she had before.  The right bundle branch block is old.  So much artifact is difficult to tell.  Patient is on a blood thinner, Plavix.  Bleeding has been controlled however.  Lab work looks otherwise stable.  Troponin was negative.  I will see if we can observe this patient overnight for possible syncope I discussed patient in detail with the hospitalist.   FINAL CLINICAL IMPRESSION(S) / ED DIAGNOSES   Final diagnoses:  Fall, initial encounter  Laceration of right lower extremity excluding thigh, initial encounter  Forehead laceration, initial encounter  Possible syncope is another diagnosis   Rx / DC Orders   ED Discharge Orders     None        Note:  This document was prepared using Dragon voice recognition software and may include unintentional dictation errors.   Nena Polio, MD 08/21/21 [phone]

## 2021-08-21 NOTE — ED Notes (Signed)
Partial bed bath given to patient. Cleaned up dried blood to head, arms, and legs. Noted small skin tear to anterior R shoulder, large skin tear with clotted blood to the R shin. Large hematoma with possible small laceration difficult to visualize due to clotted blood.

## 2021-08-21 NOTE — Progress Notes (Signed)
Patient's grandson Lilian Kapur will be coming up to the floor Around 9 pm, to stay with the patient Patient has bad dementia. For questions, call ED.

## 2021-08-21 NOTE — Progress Notes (Signed)
Progress Note    Angela Patterson  HUD:[id number] DOB: 29-Aug-1920  DOA: 08/21/2021 PCP: Einar Pheasant, MD      Brief Narrative:    Medical records reviewed and are as summarized below:  Angela Patterson is a 88 y.o. female with medical history significant for stroke, dementia, diabetes mellitus, hypertension, glucoma, hearing impairment, chronic right foot ulcer, frequent falls, chronic thrombocytopenia, who presented to the hospital after a mechanical fall at home.  She did not lose consciousness.  She sustained a laceration to the left forehead and the anterior aspect of the right leg.  Laceration on the right leg was sutured in the emergency room.      Assessment/Plan:   Principal Problem:   Fall at home, initial encounter Active Problems:   History of CVA (cerebrovascular accident)   Thrombocytopenia ([place])   Essential hypertension   Type 2 diabetes mellitus (H[facility])   Ulcer of right foot with fat layer exposed ([place])   Hearing impairment   Glaucoma   Laceration of eyebrow and forehead, initial encounter   Laceration of right lower leg   Paroxysmal atrial fibrillation (HCC)    Body mass index is 19.65 kg/m.   S/p fall at home complicated by left forehead and right leg laceration: Right leg laceration was sutured in the emergency room.  Analgesics as needed for pain.  Consult PT and OT.  Fall precautions  Hypertension: Continue amlodipine  Type II DM with hyperglycemia: Continue empagliflozin.  She declined sliding scale insulin.  History of stroke: Continue Plavix  Questionable paroxysmal atrial fibrillation with RVR on EKG: I suspect this is an artifact.  Repeat EKG.  In any case, her daughter does not want any long-term anticoagulation  Other comorbidities include dementia, hearing impairment, glaucoma, chronic thrombocytopenia  Diet Order             Diet heart healthy/carb modified Room service appropriate? Yes; Fluid consistency: Thin  Diet effective now                         Consultants: None  Procedures: Suturing of right leg laceration in the emergency room    Medications:    amLODipine  5 mg Oral Daily   clopidogrel  75 mg Oral Daily   empagliflozin  25 mg Oral Daily   insulin aspart  0-9 Units Subcutaneous TID WC   sertraline  50 mg Oral QHS   sodium chloride flush  3 mL Intravenous Q12H   [START ON 08/22/2021] timolol  1 drop Both Eyes Daily   Continuous Infusions:   Anti-infectives (From admission, onward)    None              Family Communication/Anticipated D/C date and plan/Code Status   DVT prophylaxis: SCDs Start: 08/21/21 0348     Code Status: DNR  Family Communication: [name], daughter, at the bedside Disposition Plan: Plan to discharge home versus SNF   Status is: Observation The patient will require care spanning > 2 midnights and should be moved to inpatient because: Unsafe discharge             Subjective:   Interval events noted.  She has no complaints.  She does not report any pain.  Objective:    Vitals:   08/21/21 0615 08/21/21 0620 08/21/21 0857 08/21/21 0917  BP: 103/86  103/86 135/70  Pulse: 65  65 (!) 57  Resp: (!) 21  (!) 21 17  Temp:  98.2 F (36.8 C) 98.2 F (36.8 C) 97.6 F (36.4 C)  TempSrc:  Axillary Oral   SpO2: 97%   98%  Weight:   47.2 kg   Height:   5\' 1"  (1.549 m)    Orthostatic VS for the past 24 hrs:  BP- Lying Pulse- Lying BP- Sitting Pulse- Sitting  08/21/21 0619 103/86 64 (!) 118/92 79    No intake or output data in the 24 hours ending 08/21/21 1216 Filed Weights   08/21/21 0857  Weight: 47.2 kg    Exam:  GEN: NAD SKIN: Left forward laceration with dressing.  Right lower anterior leg laceration has been sutured with dry and clean dressing.  Multiple bruises on the face and extremities EYES: No pallor or icterus ENT: MMM CV: RRR PULM: CTA B ABD: soft, ND, NT, +BS CNS: AAO x 3, non focal EXT: No edema or  tenderness        Data Reviewed:   I have personally reviewed following labs and imaging studies:  Labs: Labs show the following:   Basic Metabolic Panel: Recent Labs  Lab 08/21/21 0103  NA 133*  K 4.1  CL 100  CO2 26  GLUCOSE 275*  BUN 21  CREATININE 0.80  CALCIUM 8.9   GFR Estimated Creatinine Clearance: 29.3 mL/min (by C-G formula based on SCr of 0.8 mg/dL). Liver Function Tests: Recent Labs  Lab 08/21/21 0103  AST 33  ALT 24  ALKPHOS 72  BILITOT 0.9  PROT 7.1  ALBUMIN 3.6   No results for input(s): LIPASE, AMYLASE in the last 168 hours. No results for input(s): AMMONIA in the last 168 hours. Coagulation profile No results for input(s): INR, PROTIME in the last 168 hours.  CBC: Recent Labs  Lab 08/21/21 0103  WBC 10.5  NEUTROABS 7.9*  HGB 13.8  HCT 42.8  MCV 96.6  PLT 123*   Cardiac Enzymes: No results for input(s): CKTOTAL, CKMB, CKMBINDEX, TROPONINI in the last 168 hours. BNP (last 3 results) No results for input(s): PROBNP in the last 8760 hours. CBG: Recent Labs  Lab 08/21/21 0740  GLUCAP 163*   D-Dimer: No results for input(s): DDIMER in the last 72 hours. Hgb A1c: No results for input(s): HGBA1C in the last 72 hours. Lipid Profile: No results for input(s): CHOL, HDL, LDLCALC, TRIG, CHOLHDL, LDLDIRECT in the last 72 hours. Thyroid function studies: No results for input(s): TSH, T4TOTAL, T3FREE, THYROIDAB in the last 72 hours.  Invalid input(s): FREET3 Anemia work up: No results for input(s): VITAMINB12, FOLATE, FERRITIN, TIBC, IRON, RETICCTPCT in the last 72 hours. Sepsis Labs: Recent Labs  Lab 08/21/21 0103  WBC 10.5    Microbiology Recent Results (from the past 240 hour(s))  Resp Panel by RT-PCR (Flu A&B, Covid) Nasopharyngeal Swab     Status: None   Collection Time: 08/21/21  5:25 AM   Specimen: Nasopharyngeal Swab; Nasopharyngeal(NP) swabs in vial transport medium  Result Value Ref Range Status   SARS Coronavirus 2  by RT PCR NEGATIVE NEGATIVE Final    Comment: (NOTE) SARS-CoV-2 target nucleic acids are NOT DETECTED.  The SARS-CoV-2 RNA is generally detectable in upper respiratory specimens during the acute phase of infection. The lowest concentration of SARS-CoV-2 viral copies this assay can detect is 138 copies/mL. A negative result does not preclude SARS-Cov-2 infection and should not be used as the sole basis for treatment or other patient management decisions. A negative result may occur with  improper specimen collection/handling, submission of specimen other than  nasopharyngeal swab, presence of viral mutation(s) within the areas targeted by this assay, and inadequate number of viral copies(<138 copies/mL). A negative result must be combined with clinical observations, patient history, and epidemiological information. The expected result is Negative.  Fact Sheet for Patients:  EntrepreneurPulse.com.au  Fact Sheet for Healthcare Providers:  IncredibleEmployment.be  This test is no t yet approved or cleared by the [place] FDA and  has been authorized for detection and/or diagnosis of SARS-CoV-2 by FDA under an Emergency Use Authorization (EUA). This EUA will remain  in effect (meaning this test can be used) for the duration of the COVID-19 declaration under Section 564(b)(1) of the Act, 21 U.S.C.section 360bbb-3(b)(1), unless the authorization is terminated  or revoked sooner.       Influenza A by PCR NEGATIVE NEGATIVE Final   Influenza B by PCR NEGATIVE NEGATIVE Final    Comment: (NOTE) The Xpert Xpress SARS-CoV-2/FLU/RSV plus assay is intended as an aid in the diagnosis of influenza from Nasopharyngeal swab specimens and should not be used as a sole basis for treatment. Nasal washings and aspirates are unacceptable for Xpert Xpress SARS-CoV-2/FLU/RSV testing.  Fact Sheet for Patients: EntrepreneurPulse.com.au  Fact  Sheet for Healthcare Providers: IncredibleEmployment.be  This test is not yet approved or cleared by the [place] FDA and has been authorized for detection and/or diagnosis of SARS-CoV-2 by FDA under an Emergency Use Authorization (EUA). This EUA will remain in effect (meaning this test can be used) for the duration of the COVID-19 declaration under Section 564(b)(1) of the Act, 21 U.S.C. section 360bbb-3(b)(1), unless the authorization is terminated or revoked.  Performed at [facility], [address] [place], [place] [address]     Procedures and diagnostic studies:  DG Chest 2 View  Result Date: 08/21/2021 CLINICAL DATA:  Fall EXAM: CHEST - 2 VIEW COMPARISON:  12/20/2020 FINDINGS: Lungs are clear.  No pleural effusion or pneumothorax. The heart is normal in size.  Thoracic aortic atherosclerosis. Visualized osseous structures are within normal limits. IMPRESSION: Normal chest radiographs. Electronically Signed   By: Julian Hy M.D.   On: 08/21/2021 01:22   DG Tibia/Fibula Right  Result Date: 08/21/2021 CLINICAL DATA:  Fall, right leg pain EXAM: RIGHT TIBIA AND FIBULA - 2 VIEW COMPARISON:  None. FINDINGS: No fracture or dislocation is seen. The joint spaces are preserved. Soft tissue laceration along the anterior aspect of the mid tibia. No radiopaque foreign body is seen. IMPRESSION: Soft tissue laceration along the anterior aspect of the mid tibia. No fracture, dislocation, or radiopaque foreign body is seen. Electronically Signed   By: Julian Hy M.D.   On: 08/21/2021 01:22   CT Head Wo Contrast  Result Date: 08/21/2021 CLINICAL DATA:  Unwitnessed fall EXAM: CT HEAD WITHOUT CONTRAST CT CERVICAL SPINE WITHOUT CONTRAST TECHNIQUE: Multidetector CT imaging of the head and cervical spine was performed following the standard protocol without intravenous contrast. Multiplanar CT image reconstructions of the cervical spine were also generated.  RADIATION DOSE REDUCTION: This exam was performed according to the departmental dose-optimization program which includes automated exposure control, adjustment of the mA and/or kV according to patient size and/or use of iterative reconstruction technique. COMPARISON:  CT head dated 10/18/2020 FINDINGS: CT HEAD FINDINGS Brain: No evidence of acute infarction, hemorrhage, hydrocephalus, extra-axial collection or mass lesion/mass effect. Global cortical and central atrophy. Secondary ventricular prominence. Subcortical white matter and periventricular small vessel ischemic changes. Vascular: Intracranial atherosclerosis. Skull: Normal. Negative for fracture or focal lesion. Sinuses/Orbits: The visualized  paranasal sinuses are essentially clear. The mastoid air cells are unopacified. Other: Small hematoma overlying the left frontal bone (series 2/image 11). CT CERVICAL SPINE FINDINGS Alignment: Reversal the normal lower cervical lordosis, likely positional. Skull base and vertebrae: No acute fracture. No primary bone lesion or focal pathologic process. Soft tissues and spinal canal: No prevertebral fluid or swelling. No visible canal hematoma. Disc levels: Mild degenerative changes of the mid/lower cervical spine. Spinal canal is patent. Upper chest: 4 mm subpleural nodule in the anterior left lung apex (series 2/image 76). This is of questionable clinical significance given the patient's age, but if warranted, follow-up CT chest could be performed in 1 year. Other: Visualized thyroid is unremarkable. IMPRESSION: Small hematoma overlying the left frontal bone. No evidence of calvarial fracture. No evidence of acute intracranial abnormality. Atrophy with small vessel ischemic changes. No evidence of traumatic injury to the cervical spine. Mild degenerative changes of the mid/lower cervical spine. Electronically Signed   By: Julian Hy M.D.   On: 08/21/2021 01:41   CT Cervical Spine Wo Contrast  Result Date:  08/21/2021 CLINICAL DATA:  Unwitnessed fall EXAM: CT HEAD WITHOUT CONTRAST CT CERVICAL SPINE WITHOUT CONTRAST TECHNIQUE: Multidetector CT imaging of the head and cervical spine was performed following the standard protocol without intravenous contrast. Multiplanar CT image reconstructions of the cervical spine were also generated. RADIATION DOSE REDUCTION: This exam was performed according to the departmental dose-optimization program which includes automated exposure control, adjustment of the mA and/or kV according to patient size and/or use of iterative reconstruction technique. COMPARISON:  CT head dated 10/18/2020 FINDINGS: CT HEAD FINDINGS Brain: No evidence of acute infarction, hemorrhage, hydrocephalus, extra-axial collection or mass lesion/mass effect. Global cortical and central atrophy. Secondary ventricular prominence. Subcortical white matter and periventricular small vessel ischemic changes. Vascular: Intracranial atherosclerosis. Skull: Normal. Negative for fracture or focal lesion. Sinuses/Orbits: The visualized paranasal sinuses are essentially clear. The mastoid air cells are unopacified. Other: Small hematoma overlying the left frontal bone (series 2/image 11). CT CERVICAL SPINE FINDINGS Alignment: Reversal the normal lower cervical lordosis, likely positional. Skull base and vertebrae: No acute fracture. No primary bone lesion or focal pathologic process. Soft tissues and spinal canal: No prevertebral fluid or swelling. No visible canal hematoma. Disc levels: Mild degenerative changes of the mid/lower cervical spine. Spinal canal is patent. Upper chest: 4 mm subpleural nodule in the anterior left lung apex (series 2/image 76). This is of questionable clinical significance given the patient's age, but if warranted, follow-up CT chest could be performed in 1 year. Other: Visualized thyroid is unremarkable. IMPRESSION: Small hematoma overlying the left frontal bone. No evidence of calvarial fracture.  No evidence of acute intracranial abnormality. Atrophy with small vessel ischemic changes. No evidence of traumatic injury to the cervical spine. Mild degenerative changes of the mid/lower cervical spine. Electronically Signed   By: Julian Hy M.D.   On: 08/21/2021 01:41               LOS: 0 days   Keela Rubert  [facility]   Pager on [email or web address]. If 7PM-7AM, please contact night-coverage at [email or web address]     08/21/2021, 12:16 PM

## 2021-08-21 NOTE — Assessment & Plan Note (Signed)
Secondary to fall Wound care

## 2021-08-21 NOTE — Progress Notes (Signed)
Initial Nutrition Assessment  DOCUMENTATION CODES:  Non-severe (moderate) malnutrition in context of chronic illness  INTERVENTION:  Liberalize diet to Carb Modified.  Add Ensure Plus High Protein po BID, each supplement provides 350 kcal and 20 grams of protein.   Add MVI with minerals daily.  Encourage PO and supplement intake.  NUTRITION DIAGNOSIS:  Moderate Malnutrition related to chronic illness (uncontrolled T2DM) as evidenced by moderate muscle depletion, mild fat depletion.  GOAL: Patient will meet greater than or equal to 90% of their needs  MONITOR:  PO intake, Supplement acceptance, Diet advancement, Labs, Weight trends, Skin, I & O's  REASON FOR ASSESSMENT:  Consult Assessment of nutrition requirement/status  ASSESSMENT:  88 yo female with a PMH of T2DM, HTN, prior CVA, glaucoma, hearing impaired, chronic ulcer right foot followed by podiatry, history of frequent falls, and thrombocytopenia who presented to the ED following a fall.  Spoke to patient and daughter at bedside. Received most history from daughter as patient is hard of hearing. Daughter reports that patient eats very well at home and "never misses a meal."  Pt's weight has remained stable over the past 1.5 years per Epic. Daughter at bedside endorses very minimal change - UBW is between 104-107 lbs.  Daughter requested Ensure for patient. Also endorses that pt likes sweets and is very active at home.  Spoke with MD regarding liberalizing diet to regular given patient's age. Pt's daughter refusing insulin at this time, so diet to liberalize only to carb modified.  Medications: reviewed; Jardiance, SSI  Labs: reviewed; Na 133 (L), CBG 163-257 (H) HbA1c: 8.7% (12/20/2020)  NUTRITION - FOCUSED PHYSICAL EXAM: Flowsheet Row Most Recent Value  Orbital Region Mild depletion  Upper Arm Region Mild depletion  Thoracic and Lumbar Region No depletion  Buccal Region Mild depletion  Temple Region Moderate  depletion  Clavicle Bone Region Moderate depletion  Clavicle and Acromion Bone Region Moderate depletion  Scapular Bone Region Unable to assess  Dorsal Hand No depletion  Patellar Region Mild depletion  Anterior Thigh Region Moderate depletion  Posterior Calf Region Moderate depletion  Edema (RD Assessment) None  Hair Reviewed  Eyes Reviewed  Mouth Reviewed  Skin Reviewed  Nails Reviewed   Diet Order:   Diet Order             Diet Carb Modified Fluid consistency: Thin; Room service appropriate? Yes  Diet effective now                  EDUCATION NEEDS:  Education needs have been addressed  Skin:  Skin Assessment: Reviewed RN Assessment  Last BM:  08/20/21  Height:  Ht Readings from Last 1 Encounters:  08/21/21 5\' 1"  (1.549 m)   Weight:  Wt Readings from Last 1 Encounters:  08/21/21 47.2 kg   BMI:  Body mass index is 19.65 kg/m.  Estimated Nutritional Needs:  Kcal:  1400-1600 Protein:  65-80 grams Fluid:  >1.4 L  Derrel Nip, RD, LDN (she/her/hers) Clinical Inpatient Dietitian RD Pager/After-Hours/Weekend Pager # in [place]

## 2021-08-21 NOTE — Assessment & Plan Note (Addendum)
History of TIA many years ago, per daughter at bedside Continue Plavix and rosuvastatin

## 2021-08-21 NOTE — Assessment & Plan Note (Addendum)
Sustained following a fall several months prior Followed by podiatry.  Last seen 08/01/2021 Wound care
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
## 2021-08-21 NOTE — Hospital Course (Signed)
DM, HTN, prior CVA, glaucoma, hearing impaired, chronic ulcer right foot followed by podiatry, thrombocytopenia who presented to the ED following a fall, circumstances unclear.  Patient did not lose consciousness, and had to scoot on the floor from the bathroom to the bedroom to get telephone when she was able to call her niece who found on the floor with trail of blood.  Patient sustained a laceration to the forehead and the right shin.  She denies other complaints.  She was not recently ill.

## 2021-08-22 ENCOUNTER — Ambulatory Visit: Payer: Medicare Other | Admitting: Podiatry

## 2021-08-22 ENCOUNTER — Encounter: Payer: Self-pay | Admitting: Internal Medicine

## 2021-08-22 DIAGNOSIS — I4510 Unspecified right bundle-branch block: Secondary | ICD-10-CM | POA: Diagnosis present

## 2021-08-22 DIAGNOSIS — E782 Mixed hyperlipidemia: Secondary | ICD-10-CM | POA: Diagnosis not present

## 2021-08-22 DIAGNOSIS — H409 Unspecified glaucoma: Secondary | ICD-10-CM | POA: Diagnosis present

## 2021-08-22 DIAGNOSIS — R404 Transient alteration of awareness: Secondary | ICD-10-CM | POA: Diagnosis not present

## 2021-08-22 DIAGNOSIS — Z9049 Acquired absence of other specified parts of digestive tract: Secondary | ICD-10-CM | POA: Diagnosis not present

## 2021-08-22 DIAGNOSIS — Z736 Limitation of activities due to disability: Secondary | ICD-10-CM | POA: Diagnosis not present

## 2021-08-22 DIAGNOSIS — Z8673 Personal history of transient ischemic attack (TIA), and cerebral infarction without residual deficits: Secondary | ICD-10-CM | POA: Diagnosis not present

## 2021-08-22 DIAGNOSIS — Y92009 Unspecified place in unspecified non-institutional (private) residence as the place of occurrence of the external cause: Secondary | ICD-10-CM | POA: Diagnosis not present

## 2021-08-22 DIAGNOSIS — S0181XA Laceration without foreign body of other part of head, initial encounter: Secondary | ICD-10-CM | POA: Diagnosis present

## 2021-08-22 DIAGNOSIS — E440 Moderate protein-calorie malnutrition: Secondary | ICD-10-CM | POA: Insufficient documentation

## 2021-08-22 DIAGNOSIS — F3289 Other specified depressive episodes: Secondary | ICD-10-CM | POA: Diagnosis not present

## 2021-08-22 DIAGNOSIS — I480 Paroxysmal atrial fibrillation: Secondary | ICD-10-CM

## 2021-08-22 DIAGNOSIS — Z66 Do not resuscitate: Secondary | ICD-10-CM | POA: Diagnosis present

## 2021-08-22 DIAGNOSIS — E11621 Type 2 diabetes mellitus with foot ulcer: Secondary | ICD-10-CM | POA: Diagnosis present

## 2021-08-22 DIAGNOSIS — Z681 Body mass index (BMI) 19 or less, adult: Secondary | ICD-10-CM | POA: Diagnosis not present

## 2021-08-22 DIAGNOSIS — E7800 Pure hypercholesterolemia, unspecified: Secondary | ICD-10-CM | POA: Diagnosis present

## 2021-08-22 DIAGNOSIS — Z7401 Bed confinement status: Secondary | ICD-10-CM | POA: Diagnosis not present

## 2021-08-22 DIAGNOSIS — Y92000 Kitchen of unspecified non-institutional (private) residence as  the place of occurrence of the external cause: Secondary | ICD-10-CM | POA: Diagnosis not present

## 2021-08-22 DIAGNOSIS — Z20822 Contact with and (suspected) exposure to covid-19: Secondary | ICD-10-CM | POA: Diagnosis present

## 2021-08-22 DIAGNOSIS — Z9181 History of falling: Secondary | ICD-10-CM | POA: Diagnosis not present

## 2021-08-22 DIAGNOSIS — H9190 Unspecified hearing loss, unspecified ear: Secondary | ICD-10-CM | POA: Diagnosis present

## 2021-08-22 DIAGNOSIS — W19XXXA Unspecified fall, initial encounter: Secondary | ICD-10-CM | POA: Diagnosis not present

## 2021-08-22 DIAGNOSIS — Z823 Family history of stroke: Secondary | ICD-10-CM | POA: Diagnosis not present

## 2021-08-22 DIAGNOSIS — L97512 Non-pressure chronic ulcer of other part of right foot with fat layer exposed: Secondary | ICD-10-CM | POA: Diagnosis present

## 2021-08-22 DIAGNOSIS — Z833 Family history of diabetes mellitus: Secondary | ICD-10-CM | POA: Diagnosis not present

## 2021-08-22 DIAGNOSIS — R488 Other symbolic dysfunctions: Secondary | ICD-10-CM | POA: Diagnosis not present

## 2021-08-22 DIAGNOSIS — M6259 Muscle wasting and atrophy, not elsewhere classified, multiple sites: Secondary | ICD-10-CM | POA: Diagnosis not present

## 2021-08-22 DIAGNOSIS — W1830XA Fall on same level, unspecified, initial encounter: Secondary | ICD-10-CM | POA: Diagnosis present

## 2021-08-22 DIAGNOSIS — S81811A Laceration without foreign body, right lower leg, initial encounter: Secondary | ICD-10-CM | POA: Diagnosis present

## 2021-08-22 DIAGNOSIS — Z8249 Family history of ischemic heart disease and other diseases of the circulatory system: Secondary | ICD-10-CM | POA: Diagnosis not present

## 2021-08-22 DIAGNOSIS — R5381 Other malaise: Secondary | ICD-10-CM | POA: Diagnosis not present

## 2021-08-22 DIAGNOSIS — R2681 Unsteadiness on feet: Secondary | ICD-10-CM | POA: Diagnosis not present

## 2021-08-22 DIAGNOSIS — R1312 Dysphagia, oropharyngeal phase: Secondary | ICD-10-CM | POA: Diagnosis not present

## 2021-08-22 DIAGNOSIS — I10 Essential (primary) hypertension: Secondary | ICD-10-CM | POA: Diagnosis present

## 2021-08-22 DIAGNOSIS — F03918 Unspecified dementia, unspecified severity, with other behavioral disturbance: Secondary | ICD-10-CM | POA: Diagnosis present

## 2021-08-22 DIAGNOSIS — R498 Other voice and resonance disorders: Secondary | ICD-10-CM | POA: Diagnosis not present

## 2021-08-22 DIAGNOSIS — D696 Thrombocytopenia, unspecified: Secondary | ICD-10-CM | POA: Diagnosis present

## 2021-08-22 DIAGNOSIS — E1165 Type 2 diabetes mellitus with hyperglycemia: Secondary | ICD-10-CM | POA: Diagnosis present

## 2021-08-22 DIAGNOSIS — Z90710 Acquired absence of both cervix and uterus: Secondary | ICD-10-CM | POA: Diagnosis not present

## 2021-08-22 LAB — BASIC METABOLIC PANEL
Anion gap: 8 (ref 5–15)
BUN: 18 mg/dL (ref 8–23)
CO2: 25 mmol/L (ref 22–32)
Calcium: 8.6 mg/dL — ABNORMAL LOW (ref 8.9–10.3)
Chloride: 101 mmol/L (ref 98–111)
Creatinine, Ser: 0.56 mg/dL (ref 0.44–1.00)
GFR, Estimated: >60 mL/min (ref >60)
Glucose, Bld: 235 mg/dL — ABNORMAL HIGH (ref 70–99)
Potassium: 4.1 mmol/L (ref 3.5–5.1)
Sodium: 134 mmol/L — ABNORMAL LOW (ref 135–145)

## 2021-08-22 LAB — HEMOGLOBIN A1C
Hgb A1c MFr Bld: 10 % — ABNORMAL HIGH (ref 4.8–5.6)
Mean Plasma Glucose: 240 mg/dL

## 2021-08-22 LAB — CBC WITH DIFFERENTIAL/PLATELET
Abs Immature Granulocytes: 0.04 10*3/uL (ref 0.00–0.07)
Basophils Absolute: 0 10*3/uL (ref 0.0–0.1)
Basophils Relative: 1 %
Eosinophils Absolute: 0.2 10*3/uL (ref 0.0–0.5)
Eosinophils Relative: 2 %
HCT: 36.9 % (ref 36.0–46.0)
Hemoglobin: 12.3 g/dL (ref 12.0–15.0)
Immature Granulocytes: 1 %
Lymphocytes Relative: 19 %
Lymphs Abs: 1.7 10*3/uL (ref 0.7–4.0)
MCH: 31.3 pg (ref 26.0–34.0)
MCHC: 33.3 g/dL (ref 30.0–36.0)
MCV: 93.9 fL (ref 80.0–100.0)
Monocytes Absolute: 0.8 10*3/uL (ref 0.1–1.0)
Monocytes Relative: 10 %
Neutro Abs: 6 10*3/uL (ref 1.7–7.7)
Neutrophils Relative %: 67 %
Platelets: 106 10*3/uL — ABNORMAL LOW (ref 150–400)
RBC: 3.93 MIL/uL (ref 3.87–5.11)
RDW: 12.3 % (ref 11.5–15.5)
WBC: 8.9 10*3/uL (ref 4.0–10.5)
nRBC: 0 % (ref 0.0–0.2)

## 2021-08-22 LAB — GLUCOSE, CAPILLARY
Glucose-Capillary: 241 mg/dL — ABNORMAL HIGH (ref 70–99)
Glucose-Capillary: 177 mg/dL — ABNORMAL HIGH (ref 70–99)
Glucose-Capillary: 155 mg/dL — ABNORMAL HIGH (ref 70–99)
Glucose-Capillary: 144 mg/dL — ABNORMAL HIGH (ref 70–99)

## 2021-08-22 MED ORDER — HALOPERIDOL 0.5 MG PO TABS
0.5000 mg | ORAL_TABLET | Freq: Two times a day (BID) | ORAL | Status: DC
  Administered 2021-08-22 – 2021-08-24 (×4): 0.5 mg via ORAL
  Filled 2021-08-22 (×8): qty 1

## 2021-08-22 MED ORDER — HALOPERIDOL LACTATE 5 MG/ML IJ SOLN: 2.0000 mg | Freq: Four times a day (QID) | INTRAMUSCULAR | Status: DC | PRN

## 2021-08-22 NOTE — Progress Notes (Signed)
Occupational Therapy Treatment Patient Details Name: LATARRA EAGLETON MRN: [id number] DOB: 15-Feb-1921 Today's Date: 08/22/2021   History of present illness Pt is a 88 yo female that presented to the ED from a fall, admitted for observation for potential syncopal workup. PMH of CVA, DM, HTN, hearing impaired, frequent falls, thrombocytopenia, and chronic R foot ulcer.   OT comments  Significant functional decline noted since yesterday, with pt requiring max A to Dep A sit EOB d/t retropulsion, 2 person assist for repositioning in bed with pt inconsistent to follow 1 step commands.  Dep A to sit EOB while nursing administered meds with max vc for pt to sip and swallow water as pt was holding pills in mouth; RN planning to bring SLP in.  Family reported pt had never had ativan before and may be reacting to this, and reported that pt had been up very restless since the middle of the night and had just gotten to sleep upon OT arrival.  Increased assist levels for bed mobility and sitting likely related to meds and lack of sleep.  Updated d/c recommendation to SNF, though OT to reassess next session when pt may be more alert.  Family hoping to d/c pt to [facility] for rehab.  Pt will continue to benefit from skilled OT to progress ADLs and functional mobility to work towards return to [facility] and reduce risk for further functional decline during hospital stay.    Recommendations for follow up therapy are one component of a multi-disciplinary discharge planning process, led by the attending physician.  Recommendations may be updated based on patient status, additional functional criteria and insurance authorization.    Follow Up Recommendations  Skilled nursing-short term rehab (<3 hours/day)    Assistance Recommended at Discharge Frequent or constant Supervision/Assistance  Patient can return home with the following  Two people to help with walking and/or transfers;Two people to help with  bathing/dressing/bathroom;Assistance with feeding;Direct supervision/assist for medications management;Help with stairs or ramp for entrance   Equipment Recommendations  Other (comment) (defer to next venue of care)    Recommendations for Other Services      Precautions / Restrictions Precautions Precautions: Fall Restrictions Weight Bearing Restrictions: No       Mobility Bed Mobility Overal bed mobility: Needs Assistance Bed Mobility: Sit to Supine     Supine to sit: Max assist Sit to supine: Total assist   General bed mobility comments: increased confusion contributing to decline since yesterday    Transfers                         Balance Overall balance assessment: Needs assistance Sitting-balance support: Bilateral upper extremity supported, Feet supported Sitting balance-Leahy Scale: Zero Sitting balance - Comments: retropulsion sitting EOB for static sitting; standing not attempted this session Postural control: Posterior lean                                 ADL either performed or assessed with clinical judgement   ADL Overall ADL's : Needs assistance/impaired Eating/Feeding: Total assistance Eating/Feeding Details (indicate cue type and reason): Pt required max vc/tactile cues for sipping water and swallowing while RN gave meds; pt unable to hold her own cup during session; OT assisted with maintaining EOB sitting for med administration. Grooming: Dance movement psychotherapist;Total assistance Grooming Details (indicate cue type and reason): EOB  Extremity/Trunk Assessment Upper Extremity Assessment Upper Extremity Assessment: Generalized weakness   Lower Extremity Assessment Lower Extremity Assessment: Defer to PT evaluation        Vision Patient Visual Report: No change from baseline                Cognition Arousal/Alertness: Lethargic Behavior During Therapy:  (drowsy,  confused) Overall Cognitive Status: Impaired/Different from baseline Area of Impairment: Following commands, Attention                 Orientation Level: Disoriented to     Following Commands: Follows one step commands inconsistently       General Comments: family states pt had been awake since the middle of the night and finally settled down when OT arrived, so increased drowsiness appears to be contributing to increased confusion.        Exercises      Shoulder Instructions       General Comments multiple lacerations and bruising head, R side of chest, R hip, RLE from pt's fall which prompted hospitalization    Pertinent Vitals/ Pain       Pain Assessment Pain Assessment: Faces Faces Pain Scale: Hurts little more Pain Location: RLE Pain Descriptors / Indicators: Sore Pain Intervention(s): Limited activity within patient's tolerance, Monitored during session, Repositioned                                                          Frequency  Min 2X/week        Progress Toward Goals  OT Goals(current goals can now be found in the care plan section)  Progress towards OT goals: OT to reassess next treatment  Acute Rehab OT Goals Patient Stated Goal: Family wanting pt to d/c to [facility] OT Goal Formulation: With family Time For Goal Achievement: 09/04/21 Potential to Achieve Goals: [place] Discharge plan needs to be updated    Co-evaluation                 AM-PAC OT "6 Clicks" Daily Activity     Outcome Measure   Help from another person eating meals?: Total Help from another person taking care of personal grooming?: Total   Help from another person bathing (including washing, rinsing, drying)?: Total Help from another person to put on and taking off regular upper body clothing?: Total Help from another person to put on and taking off regular lower body clothing?: Total 6 Click Score: 5    End of Session    OT Visit  Diagnosis: Unsteadiness on feet (R26.81);Repeated falls (R29.6);Muscle weakness (generalized) (M62.81)   Activity Tolerance Patient limited by lethargy;Patient limited by pain   Patient Left in bed;with call bell/phone within reach;with bed alarm set;with family/visitor present   Nurse Communication Mobility status        Time: 2229-7989 OT Time Calculation (min): 21 min  Charges: OT General Charges $OT Visit: 1 Visit OT Treatments $Self Care/Home Management : 8-22 mins  Leta Speller, MS, OTR/L   Darleene Cleaver 08/22/2021, 11:50 AM

## 2021-08-22 NOTE — Progress Notes (Signed)
Offered to change this patients dressings as they are dirty and bloody. Family declined because patient is getting some much needed rest at this time. Instructed the family to call me if the patient wakes up so we can clean wounds and change bandages.

## 2021-08-22 NOTE — Progress Notes (Signed)
Physical Therapy Treatment Patient Details Name: Angela Patterson MRN: [id number] DOB: 16-Mar-1921 Today's Date: 08/22/2021   History of Present Illness Pt is a 88 yo female that presented to the ED from a fall, admitted for observation for potential syncopal workup. PMH of CVA, DM, HTN, hearing impaired, frequent falls, thrombocytopenia, and chronic R foot ulcer.    PT Comments    Pt wakes to touch, very HOH, and increased confusion from yesterday noted. MaxA to come up in to sitting EOB. Varying levels of balance but with at least unilateral support pt able to maintain upright positioning. At least 10 sit <> stands with mod-maxA performed during session, pt restless and began to explain need to use the bathroom. With 2 person assist, pt maxAx2/totalAx2 to stand pivot to [facility]. Pt able to void and have BM, maxA for pericare. Returned to supine with all needs in reach, all linens,gowns, and briefs changed as needed. The patient would benefit from further skilled PT intervention to continue to progress towards goals. Recommendation remains appropriate.     Recommendations for follow up therapy are one component of a multi-disciplinary discharge planning process, led by the attending physician.  Recommendations may be updated based on patient status, additional functional criteria and insurance authorization.  Follow Up Recommendations  Skilled nursing-short term rehab (<3 hours/day)     Assistance Recommended at Discharge Frequent or constant Supervision/Assistance  Patient can return home with the following Assist for transportation;Help with stairs or ramp for entrance;A lot of help with bathing/dressing/bathroom;A lot of help with walking and/or transfers;Assistance with cooking/housework;Direct supervision/assist for medications management   Equipment Recommendations  Rolling walker (2 wheels)    Recommendations for Other Services       Precautions / Restrictions Precautions Precautions:  Fall Restrictions Weight Bearing Restrictions: No     Mobility  Bed Mobility Overal bed mobility: Needs Assistance Bed Mobility: Supine to Sit, Sit to Supine     Supine to sit: Max assist Sit to supine: Mod assist   General bed mobility comments: increased confusion contributing to decline since yesterday    Transfers Overall transfer level: Needs assistance Equipment used: 2 person hand held assist Transfers: Sit to/from Stand, Bed to chair/wheelchair/BSC Sit to Stand: Max assist, Mod assist, +2 safety/equipment Stand pivot transfers: Mod assist, Max assist, Total assist, +2 physical assistance, +2 safety/equipment              Ambulation/Gait               General Gait Details: unable to ambulate today due to increased confusion, agitation   Stairs             Wheelchair Mobility    Modified Rankin (Stroke Patients Only)       Balance Overall balance assessment: Needs assistance Sitting-balance support: Feet supported Sitting balance-Leahy Scale: Fair Sitting balance - Comments: at least CGA throughout to maintain safety   Standing balance support: Bilateral upper extremity supported, During functional activity, Reliant on assistive device for balance Standing balance-Leahy Scale: Poor Standing balance comment: reliant on assistance                            Cognition Arousal/Alertness: Awake/alert Behavior During Therapy: Restless, Agitated, Anxious, Impulsive Overall Cognitive Status: Impaired/Different from baseline Area of Impairment: Following commands, Attention, Memory, Safety/judgement, Awareness                 Orientation Level: Disoriented to, Time, Situation,  Place Current Attention Level: Focused   Following Commands: Follows one step commands inconsistently       General Comments: pt disraught about going to the bathroom, at end of session settles down for sleep        Exercises      General  Comments General comments (skin integrity, edema, etc.): multiple lacerations and bruising head, R side of chest, R hip, RLE from pt's fall which prompted hospitalization      Pertinent Vitals/Pain Pain Assessment Pain Assessment: Faces Faces Pain Scale: Hurts little more Pain Location: RLE Pain Descriptors / Indicators: Guarding, Moaning Pain Intervention(s): Limited activity within patient's tolerance, Monitored during session, Repositioned    Home Living                          Prior Function            PT Goals (current goals can now be found in the care plan section) Progress towards PT goals: Progressing toward goals    Frequency    Min 2X/week      PT Plan Current plan remains appropriate    Co-evaluation              AM-PAC PT "6 Clicks" Mobility   Outcome Measure  Help needed turning from your back to your side while in a flat bed without using bedrails?: A Little Help needed moving from lying on your back to sitting on the side of a flat bed without using bedrails?: A Lot Help needed moving to and from a bed to a chair (including a wheelchair)?: A Lot Help needed standing up from a chair using your arms (e.g., wheelchair or bedside chair)?: A Lot Help needed to walk in hospital room?: Total Help needed climbing 3-5 steps with a railing? : Total 6 Click Score: 11    End of Session Equipment Utilized During Treatment: Gait belt Activity Tolerance: Other (comment) (limited by cognitive status) Patient left: in bed;with call bell/phone within reach;with bed alarm set;with family/visitor present Nurse Communication: Mobility status PT Visit Diagnosis: Unsteadiness on feet (R26.81);History of falling (Z91.81);Repeated falls (R29.6);Muscle weakness (generalized) (M62.81);Other abnormalities of gait and mobility (R26.89)     Time: 0223-3612 PT Time Calculation (min) (ACUTE ONLY): 30 min  Charges:  $Therapeutic Exercise: 8-22  mins $Therapeutic Activity: 8-22 mins                     Lieutenant Diego PT, DPT 2:31 PM,08/22/21

## 2021-08-22 NOTE — TOC Progression Note (Addendum)
Transition of Care [facility]) - Progression Note    Patient Details  Name: Angela Patterson MRN: [id number] Date of Birth: 1921/06/16  Transition of Care [facility]) CM/SW [place], RN Phone Number: 08/22/2021, 1:36 PM  Clinical Narrative: Spoke with patient's daughter, Angela Patterson who request [facility] for rehabilitation. FL2 completed and [facility], Tammy notified to review and let me know if able to make bed offer.   3:50pm Daughter called to inquire about [facility]. Acceptance. TOCRN informed daughter that [facility] was notified and is reviewing referral and will get back with [place] if able to accept. Daughter request to discuss cost for private room if approved.    Expected Discharge Plan: [place]    Expected Discharge Plan and Services Expected Discharge Plan: [place] Choice: Durable Medical Equipment Living arrangements for the past 2 months: Single Family Home                                       Social Determinants of Health (SDOH) Interventions    Readmission Risk Interventions No flowsheet data found.

## 2021-08-22 NOTE — Progress Notes (Signed)
Mobility Specialist - Progress Note   08/22/21 1146  Mobility  Activity Refused mobility    Fatigue/lethargy limiting ability to participate. Pt sleeping on arrival, briefly awakens to voice but quickly returns to sleep; difficulty to arouse a second time with voice/touch. Family present at bedside. Will attempt session another date/time as appropriate.    Kathee Delton Mobility Specialist 08/22/21, 11:48 AM

## 2021-08-22 NOTE — Progress Notes (Signed)
Progress Note    Angela Patterson  QQP:[id number] DOB: July 07, 1921  DOA: 08/21/2021 PCP: Einar Pheasant, MD      Brief Narrative:    Medical records reviewed and are as summarized below:  Angela Patterson is a 88 y.o. female with medical history significant for stroke, dementia, diabetes mellitus, hypertension, glucoma, hearing impairment, chronic right foot ulcer, frequent falls, chronic thrombocytopenia, who presented to the hospital after a mechanical fall at home.  She did not lose consciousness.  She sustained a laceration to the left forehead and the anterior aspect of the right leg.  Laceration on the right leg was sutured in the emergency room.      Assessment/Plan:   Principal Problem:   Fall at home, initial encounter Active Problems:   History of CVA (cerebrovascular accident)   Thrombocytopenia ([place])   Essential hypertension   Type 2 diabetes mellitus (HCC)   Ulcer of right foot with fat layer exposed ([place])   Hearing impairment   Glaucoma   Laceration of eyebrow and forehead, initial encounter   Laceration of right lower leg   Paroxysmal atrial fibrillation (HCC)   Malnutrition of moderate degree    Body mass index is 19.65 kg/m.   S/p fall at home complicated by left forehead and right leg laceration: Right leg laceration was sutured in the emergency room.   Analgesics as needed for pain.  PT recommended discharge to SNF.  Hypertension: Continue amlodipine  Type II DM with hyperglycemia: Continue empagliflozin.  She declined sliding scale insulin.  History of stroke: Continue Plavix.  Daughter prefers to continue Plavix  Paroxysmal atrial fibrillation noted on telemetry: Her daughter does not want any anticoagulation for stroke prophylaxis.  Dementia with behavioral disturbance/delirium: Use Haldol as needed for agitation.  Ordered scheduled low-dose Haldol as well.  Other comorbidities include dementia, hearing impairment, glaucoma, chronic  thrombocytopenia  Diet Order             Diet Carb Modified Fluid consistency: Thin; Room service appropriate? Yes  Diet effective now                        Consultants: None  Procedures: Suturing of right leg laceration in the emergency room    Medications:    amLODipine  5 mg Oral Daily   clopidogrel  75 mg Oral Daily   empagliflozin  25 mg Oral Daily   feeding supplement  237 mL Oral BID BM   haloperidol  0.5 mg Oral BID   insulin aspart  0-9 Units Subcutaneous TID WC   multivitamin with minerals  1 tablet Oral Daily   sertraline  50 mg Oral QHS   sodium chloride flush  3 mL Intravenous Q12H   timolol  1 drop Both Eyes Daily   Continuous Infusions:   Anti-infectives (From admission, onward)    None              Family Communication/Anticipated D/C date and plan/Code Status   DVT prophylaxis: SCDs Start: 08/21/21 0348     Code Status: DNR  Family Communication: [name] (daughter), her son-in-law and Dot (her sister) Disposition Plan: Plan to discharge home versus SNF   Status is: Observation The patient will require care spanning > 2 midnights and should be moved to inpatient because: Unsafe discharge             Subjective:   Interval events noted.  She is confused and unable to provide  any history.  She has been confused and agitated according to the family at the bedside  Objective:    Vitals:   08/21/21 0917 08/21/21 2024 08/22/21 0541 08/22/21 0818  BP: 135/70 (!) 146/75 (!) 141/73 (!) 132/93  Pulse: (!) 57 69 67 67  Resp: 17 20 18 18   Temp: 97.6 F (36.4 C) (!) 97.5 F (36.4 C) 99 F (37.2 C) 98.6 F (37 C)  TempSrc:  Oral Oral Oral  SpO2: 98% 97% 95% 95%  Weight:      Height:       No data found.    Intake/Output Summary (Last 24 hours) at 08/22/2021 1331 Last data filed at 08/22/2021 1016 Gross per 24 hour  Intake 380 ml  Output --  Net 380 ml   Filed Weights   08/21/21 0857  Weight: 47.2 kg     Exam:  GEN: NAD SKIN: Left forehead laceration covered with dressing.  Right leg laceration with stitches still intact without bleeding.  Multiple bruises on the face and extremities EYES: EOMI ENT: MMM CV: RRR PULM: CTA B ABD: soft, ND, NT, +BS CNS: Alert but disoriented and restless EXT: No edema or tenderness       Data Reviewed:   I have personally reviewed following labs and imaging studies:  Labs: Labs show the following:   Basic Metabolic Panel: Recent Labs  Lab 08/21/21 0103 08/22/21 0503  NA 133* 134*  K 4.1 4.1  CL 100 101  CO2 26 25  GLUCOSE 275* 235*  BUN 21 18  CREATININE 0.80 0.56  CALCIUM 8.9 8.6*   GFR Estimated Creatinine Clearance: 29.3 mL/min (by C-G formula based on SCr of 0.56 mg/dL). Liver Function Tests: Recent Labs  Lab 08/21/21 0103  AST 33  ALT 24  ALKPHOS 72  BILITOT 0.9  PROT 7.1  ALBUMIN 3.6   No results for input(s): LIPASE, AMYLASE in the last 168 hours. No results for input(s): AMMONIA in the last 168 hours. Coagulation profile No results for input(s): INR, PROTIME in the last 168 hours.  CBC: Recent Labs  Lab 08/21/21 0103 08/22/21 0503  WBC 10.5 8.9  NEUTROABS 7.9* 6.0  HGB 13.8 12.3  HCT 42.8 36.9  MCV 96.6 93.9  PLT 123* 106*   Cardiac Enzymes: No results for input(s): CKTOTAL, CKMB, CKMBINDEX, TROPONINI in the last 168 hours. BNP (last 3 results) No results for input(s): PROBNP in the last 8760 hours. CBG: Recent Labs  Lab 08/21/21 0740 08/21/21 1234 08/21/21 2113 08/22/21 0903 08/22/21 1154  GLUCAP 163* 257* 263* 241* 177*   D-Dimer: No results for input(s): DDIMER in the last 72 hours. Hgb A1c: Recent Labs    08/21/21 0123  HGBA1C 10.0*   Lipid Profile: No results for input(s): CHOL, HDL, LDLCALC, TRIG, CHOLHDL, LDLDIRECT in the last 72 hours. Thyroid function studies: No results for input(s): TSH, T4TOTAL, T3FREE, THYROIDAB in the last 72 hours.  Invalid input(s): FREET3 Anemia  work up: No results for input(s): VITAMINB12, FOLATE, FERRITIN, TIBC, IRON, RETICCTPCT in the last 72 hours. Sepsis Labs: Recent Labs  Lab 08/21/21 0103 08/22/21 0503  WBC 10.5 8.9    Microbiology Recent Results (from the past 240 hour(s))  Resp Panel by RT-PCR (Flu A&B, Covid) Nasopharyngeal Swab     Status: None   Collection Time: 08/21/21  5:25 AM   Specimen: Nasopharyngeal Swab; Nasopharyngeal(NP) swabs in vial transport medium  Result Value Ref Range Status   SARS Coronavirus 2 by RT PCR NEGATIVE NEGATIVE  Final    Comment: (NOTE) SARS-CoV-2 target nucleic acids are NOT DETECTED.  The SARS-CoV-2 RNA is generally detectable in upper respiratory specimens during the acute phase of infection. The lowest concentration of SARS-CoV-2 viral copies this assay can detect is 138 copies/mL. A negative result does not preclude SARS-Cov-2 infection and should not be used as the sole basis for treatment or other patient management decisions. A negative result may occur with  improper specimen collection/handling, submission of specimen other than nasopharyngeal swab, presence of viral mutation(s) within the areas targeted by this assay, and inadequate number of viral copies(<138 copies/mL). A negative result must be combined with clinical observations, patient history, and epidemiological information. The expected result is Negative.  Fact Sheet for Patients:  EntrepreneurPulse.com.au  Fact Sheet for Healthcare Providers:  IncredibleEmployment.be  This test is no t yet approved or cleared by the [place] FDA and  has been authorized for detection and/or diagnosis of SARS-CoV-2 by FDA under an Emergency Use Authorization (EUA). This EUA will remain  in effect (meaning this test can be used) for the duration of the COVID-19 declaration under Section 564(b)(1) of the Act, 21 U.S.C.section 360bbb-3(b)(1), unless the authorization is terminated   or revoked sooner.       Influenza A by PCR NEGATIVE NEGATIVE Final   Influenza B by PCR NEGATIVE NEGATIVE Final    Comment: (NOTE) The Xpert Xpress SARS-CoV-2/FLU/RSV plus assay is intended as an aid in the diagnosis of influenza from Nasopharyngeal swab specimens and should not be used as a sole basis for treatment. Nasal washings and aspirates are unacceptable for Xpert Xpress SARS-CoV-2/FLU/RSV testing.  Fact Sheet for Patients: EntrepreneurPulse.com.au  Fact Sheet for Healthcare Providers: IncredibleEmployment.be  This test is not yet approved or cleared by the [place] FDA and has been authorized for detection and/or diagnosis of SARS-CoV-2 by FDA under an Emergency Use Authorization (EUA). This EUA will remain in effect (meaning this test can be used) for the duration of the COVID-19 declaration under Section 564(b)(1) of the Act, 21 U.S.C. section 360bbb-3(b)(1), unless the authorization is terminated or revoked.  Performed at [facility], [address] [place], [place] [address]     Procedures and diagnostic studies:  DG Chest 2 View  Result Date: 08/21/2021 CLINICAL DATA:  Fall EXAM: CHEST - 2 VIEW COMPARISON:  12/20/2020 FINDINGS: Lungs are clear.  No pleural effusion or pneumothorax. The heart is normal in size.  Thoracic aortic atherosclerosis. Visualized osseous structures are within normal limits. IMPRESSION: Normal chest radiographs. Electronically Signed   By: Julian Hy M.D.   On: 08/21/2021 01:22   DG Tibia/Fibula Right  Result Date: 08/21/2021 CLINICAL DATA:  Fall, right leg pain EXAM: RIGHT TIBIA AND FIBULA - 2 VIEW COMPARISON:  None. FINDINGS: No fracture or dislocation is seen. The joint spaces are preserved. Soft tissue laceration along the anterior aspect of the mid tibia. No radiopaque foreign body is seen. IMPRESSION: Soft tissue laceration along the anterior aspect of the mid tibia. No  fracture, dislocation, or radiopaque foreign body is seen. Electronically Signed   By: Julian Hy M.D.   On: 08/21/2021 01:22   CT Head Wo Contrast  Result Date: 08/21/2021 CLINICAL DATA:  Unwitnessed fall EXAM: CT HEAD WITHOUT CONTRAST CT CERVICAL SPINE WITHOUT CONTRAST TECHNIQUE: Multidetector CT imaging of the head and cervical spine was performed following the standard protocol without intravenous contrast. Multiplanar CT image reconstructions of the cervical spine were also generated. RADIATION DOSE REDUCTION: This exam was  performed according to the departmental dose-optimization program which includes automated exposure control, adjustment of the mA and/or kV according to patient size and/or use of iterative reconstruction technique. COMPARISON:  CT head dated 10/18/2020 FINDINGS: CT HEAD FINDINGS Brain: No evidence of acute infarction, hemorrhage, hydrocephalus, extra-axial collection or mass lesion/mass effect. Global cortical and central atrophy. Secondary ventricular prominence. Subcortical white matter and periventricular small vessel ischemic changes. Vascular: Intracranial atherosclerosis. Skull: Normal. Negative for fracture or focal lesion. Sinuses/Orbits: The visualized paranasal sinuses are essentially clear. The mastoid air cells are unopacified. Other: Small hematoma overlying the left frontal bone (series 2/image 11). CT CERVICAL SPINE FINDINGS Alignment: Reversal the normal lower cervical lordosis, likely positional. Skull base and vertebrae: No acute fracture. No primary bone lesion or focal pathologic process. Soft tissues and spinal canal: No prevertebral fluid or swelling. No visible canal hematoma. Disc levels: Mild degenerative changes of the mid/lower cervical spine. Spinal canal is patent. Upper chest: 4 mm subpleural nodule in the anterior left lung apex (series 2/image 76). This is of questionable clinical significance given the patient's age, but if warranted, follow-up CT  chest could be performed in 1 year. Other: Visualized thyroid is unremarkable. IMPRESSION: Small hematoma overlying the left frontal bone. No evidence of calvarial fracture. No evidence of acute intracranial abnormality. Atrophy with small vessel ischemic changes. No evidence of traumatic injury to the cervical spine. Mild degenerative changes of the mid/lower cervical spine. Electronically Signed   By: Julian Hy M.D.   On: 08/21/2021 01:41   CT Cervical Spine Wo Contrast  Result Date: 08/21/2021 CLINICAL DATA:  Unwitnessed fall EXAM: CT HEAD WITHOUT CONTRAST CT CERVICAL SPINE WITHOUT CONTRAST TECHNIQUE: Multidetector CT imaging of the head and cervical spine was performed following the standard protocol without intravenous contrast. Multiplanar CT image reconstructions of the cervical spine were also generated. RADIATION DOSE REDUCTION: This exam was performed according to the departmental dose-optimization program which includes automated exposure control, adjustment of the mA and/or kV according to patient size and/or use of iterative reconstruction technique. COMPARISON:  CT head dated 10/18/2020 FINDINGS: CT HEAD FINDINGS Brain: No evidence of acute infarction, hemorrhage, hydrocephalus, extra-axial collection or mass lesion/mass effect. Global cortical and central atrophy. Secondary ventricular prominence. Subcortical white matter and periventricular small vessel ischemic changes. Vascular: Intracranial atherosclerosis. Skull: Normal. Negative for fracture or focal lesion. Sinuses/Orbits: The visualized paranasal sinuses are essentially clear. The mastoid air cells are unopacified. Other: Small hematoma overlying the left frontal bone (series 2/image 11). CT CERVICAL SPINE FINDINGS Alignment: Reversal the normal lower cervical lordosis, likely positional. Skull base and vertebrae: No acute fracture. No primary bone lesion or focal pathologic process. Soft tissues and spinal canal: No prevertebral  fluid or swelling. No visible canal hematoma. Disc levels: Mild degenerative changes of the mid/lower cervical spine. Spinal canal is patent. Upper chest: 4 mm subpleural nodule in the anterior left lung apex (series 2/image 76). This is of questionable clinical significance given the patient's age, but if warranted, follow-up CT chest could be performed in 1 year. Other: Visualized thyroid is unremarkable. IMPRESSION: Small hematoma overlying the left frontal bone. No evidence of calvarial fracture. No evidence of acute intracranial abnormality. Atrophy with small vessel ischemic changes. No evidence of traumatic injury to the cervical spine. Mild degenerative changes of the mid/lower cervical spine. Electronically Signed   By: Julian Hy M.D.   On: 08/21/2021 01:41               LOS: 0 days  Clevland Cork  [employer], advertising on [email or web address]. If 7PM-7AM, please contact night-coverage at [email or web address]     08/22/2021, 1:31 PM

## 2021-08-22 NOTE — NC FL2 (Signed)
[place] LEVEL OF CARE SCREENING TOOL     IDENTIFICATION  Patient Name: Angela Patterson Birthdate: 1920-10-30 Sex: female Admission Date (Current Location): 08/21/2021  [facility] and [place] Number:  Engineering geologist and Address:  [facility], [address], [place], [place] [address]      Provider Number: [id number]  Attending Physician Name and Address:  Jennye Boroughs, MD  Relative Name and Phone Number:  Marcell Barlow (Daughter)   [phone]    Current Level of Care: Hospital Recommended Level of Care: [place] Prior Approval Number:    Date Approved/Denied:   PASRR Number: [id number]  Discharge Plan: SNF    Current Diagnoses: Patient Active Problem List   Diagnosis Date Noted   Malnutrition of moderate degree 08/22/2021   Glaucoma 08/21/2021   Laceration of eyebrow and forehead, initial encounter 08/21/2021   Laceration of right lower leg 08/21/2021   Paroxysmal atrial fibrillation ([place]) 08/21/2021   Swallowing difficulty 07/06/2021   Wound of right leg 06/13/2021   Abnormal urine odor 05/20/2021   Weakness 10/27/2020   Lobar pneumonia ([place])    Lactic acidosis    Acute metabolic encephalopathy    CAP (community acquired pneumonia) 10/18/2020   Cough 09/15/2020   Finger lesion 06/13/2020   Nasal lesion 06/13/2020   Hearing impairment 12/18/2019   Finger laceration 02/21/2019   Gait abnormality 11/13/2018   Fall at home, initial encounter 08/28/2018   Anxiety 06/22/2018   Diarrhea 12/13/2017   Rectal bleeding 07/07/2017   Ulcer of right foot with fat layer exposed ([place]) 04/18/2017   Open wound of foot excluding toes without complication 53/74/8270   Health care maintenance 10/27/2014   Weight loss 12/27/2013   Thrombocytopenia ([place]) 11/20/2012   Hypercholesterolemia 07/21/2012   History of CVA (cerebrovascular accident) 07/21/2012   Essential hypertension 07/21/2012   Pure  hypercholesterolemia 07/21/2012   Type 2 diabetes mellitus ([place]) 07/21/2012    Orientation RESPIRATION BLADDER Height & Weight     Self  Normal External catheter Weight: 47.2 kg Height:  5\' 1"  (154.9 cm)  BEHAVIORAL SYMPTOMS/MOOD NEUROLOGICAL BOWEL NUTRITION STATUS        Diet  AMBULATORY STATUS COMMUNICATION OF NEEDS Skin   Extensive Assist Verbally Skin abrasions, Bruising (Right knee abraison, facial brusing due to fall)                       Personal Care Assistance Level of Assistance  Bathing, Feeding, Dressing Bathing Assistance: Limited assistance Feeding assistance: Limited assistance Dressing Assistance: Limited assistance     Functional Limitations Info  Sight, Hearing, Speech   Hearing Info: Impaired (hard of hearing) Speech Info: Adequate    SPECIAL CARE FACTORS FREQUENCY  PT (By licensed PT), OT (By licensed OT)     PT Frequency: 5x week OT Frequency: 5x week            Contractures Contractures Info: Not present    Additional Factors Info  Code Status, Allergies Code Status Info: DNR Allergies Info: Actos, Contrast Dye, Prandin, Pravastatin Sodium           Current Medications (08/22/2021):  This is the current hospital active medication list Current Facility-Administered Medications  Medication Dose Route Frequency Provider Last Rate Last Admin   acetaminophen (TYLENOL) tablet 650 mg  650 mg Oral Q6H PRN Athena Masse, MD   650 mg at 08/21/21 1453   Or   acetaminophen (TYLENOL) suppository 650 mg  650  mg Rectal Q6H PRN Athena Masse, MD       amLODipine (NORVASC) tablet 5 mg  5 mg Oral Daily Athena Masse, MD   5 mg at 08/22/21 0910   clopidogrel (PLAVIX) tablet 75 mg  75 mg Oral Daily Athena Masse, MD   75 mg at 08/21/21 1751   empagliflozin (JARDIANCE) tablet 25 mg  25 mg Oral Daily Athena Masse, MD   25 mg at 08/22/21 0910   feeding supplement (ENSURE ENLIVE / ENSURE PLUS) liquid 237 mL  237 mL Oral BID BM Jennye Boroughs, MD    237 mL at 08/22/21 0910   haloperidol (HALDOL) tablet 0.5 mg  0.5 mg Oral BID Jennye Boroughs, MD   0.5 mg at 08/22/21 1053   haloperidol lactate (HALDOL) injection 2 mg  2 mg Intramuscular Q6H PRN Jennye Boroughs, MD       insulin aspart (novoLOG) injection 0-9 Units  0-9 Units Subcutaneous TID WC Athena Masse, MD       multivitamin with minerals tablet 1 tablet  1 tablet Oral Daily Jennye Boroughs, MD   1 tablet at 08/22/21 0910   ondansetron (ZOFRAN) tablet 4 mg  4 mg Oral Q6H PRN Athena Masse, MD       Or   ondansetron [facility]) injection 4 mg  4 mg Intravenous Q6H PRN Athena Masse, MD       sertraline (ZOLOFT) tablet 50 mg  50 mg Oral QHS Judd Gaudier V, MD   50 mg at 08/21/21 2030   sodium chloride flush (NS) 0.9 % injection 3 mL  3 mL Intravenous Q12H Judd Gaudier V, MD   3 mL at 08/22/21 0911   timolol (TIMOPTIC) 0.5 % ophthalmic solution 1 drop  1 drop Both Eyes Daily Athena Masse, MD   1 drop at 08/22/21 7902     Discharge Medications: Please see discharge summary for a list of discharge medications.  Relevant Imaging Results:  Relevant Lab Results:   Additional Information SS# [id number]  Kerin Salen, RN

## 2021-08-23 LAB — GLUCOSE, CAPILLARY
Glucose-Capillary: 133 mg/dL — ABNORMAL HIGH (ref 70–99)
Glucose-Capillary: 179 mg/dL — ABNORMAL HIGH (ref 70–99)
Glucose-Capillary: 202 mg/dL — ABNORMAL HIGH (ref 70–99)
Glucose-Capillary: 131 mg/dL — ABNORMAL HIGH (ref 70–99)

## 2021-08-23 MED ORDER — ORAL CARE MOUTH RINSE
15.0000 mL | Freq: Two times a day (BID) | OROMUCOSAL | Status: DC
  Administered 2021-08-23 – 2021-08-25 (×5): 15 mL via OROMUCOSAL

## 2021-08-23 NOTE — TOC Progression Note (Signed)
Transition of Care [facility]) - Progression Note    Patient Details  Name: Angela Patterson MRN: [id number] Date of Birth: August 01, 1920  [facility] CM/SW Contact  Izola Price, RN Phone Number: 08/23/2021, 4:14 PM  Clinical Narrative:  2/4: Spoke with daughter and updated her that [facility] said private room. Updated contact number and messaged to Tammy at [facility] to contact daughter and aim for discharge Sun/Monday. Simmie Davies RN CM   Daughter [phone] new cell. Number in chart is old home phone and disconnected.  Simmie Davies RN CM   Expected Discharge Plan: [place]    Expected Discharge Plan and Services Expected Discharge Plan: [place] Choice: Durable Medical Equipment Living arrangements for the past 2 months: Single Family Home                                       Social Determinants of Health (SDOH) Interventions    Readmission Risk Interventions No flowsheet data found.

## 2021-08-23 NOTE — Progress Notes (Signed)
Mobility Specialist - Progress Note   08/23/21 1500  Mobility  Activity Ambulated with assistance in room;Transferred to/from BSC  Level of Assistance Moderate assist, patient does 50-74%  Assistive Device Other (Comment)  Distance Ambulated (ft) 3 ft  Activity Response Tolerated fair  $Mobility charge 1 Mobility    Pt lying in bed upon arrival, utilizing RA. Pt was modA for transfers and taking a few steps along bedside. Fair sitting balance this date. Pt transferred to [facility] for urinal output. HOH but able to process steps for sequencing with extra time and a few tactile cues. Total assist +2 for peri-care. Pt returned supine with alarm set, needs in reach, family at bedside.    Kathee Delton Mobility Specialist 08/23/21, 3:21 PM

## 2021-08-23 NOTE — Progress Notes (Signed)
Progress Note    Angela Patterson  GMW:[id number] DOB: 03-31-1921  DOA: 08/21/2021 PCP: Einar Pheasant, MD      Brief Narrative:    Medical records reviewed and are as summarized below:  Angela Patterson is a 88 y.o. female with medical history significant for stroke, dementia, diabetes mellitus, hypertension, glucoma, hearing impairment, chronic right foot ulcer, frequent falls, chronic thrombocytopenia, who presented to the hospital after a mechanical fall at home.  She did not lose consciousness.  She sustained a laceration to the left forehead and the anterior aspect of the right leg.  Laceration on the right leg was sutured in the emergency room.      Assessment/Plan:   Principal Problem:   Fall at home, initial encounter Active Problems:   History of CVA (cerebrovascular accident)   Thrombocytopenia ([place])   Essential hypertension   Type 2 diabetes mellitus (HCC)   Ulcer of right foot with fat layer exposed ([place])   Hearing impairment   Glaucoma   Laceration of eyebrow and forehead, initial encounter   Laceration of right lower leg   Paroxysmal atrial fibrillation (HCC)   Malnutrition of moderate degree    Body mass index is 18.58 kg/m.   S/p fall at home complicated by left forehead and right leg laceration: Right leg laceration was sutured in the emergency room.   Analgesics as needed for pain.  Awaiting placement to SNF.  Follow-up with case manager to assist with disposition  Hypertension: Continue amlodipine  Type II DM with hyperglycemia: Continue empagliflozin.  She declined sliding scale insulin.  History of stroke: Continue Plavix  Paroxysmal atrial fibrillation : Her daughter does not want any anticoagulation for stroke prophylaxis.  Dementia with behavioral disturbance/delirium: Continue low-dose Haldol and Haldol injection as needed for agitation.  Other comorbidities include dementia, hearing impairment, glaucoma, chronic  thrombocytopenia   Diet Order             Diet Carb Modified Fluid consistency: Thin; Room service appropriate? Yes  Diet effective now                        Consultants: None  Procedures: Suturing of right leg laceration in the emergency room    Medications:    amLODipine  5 mg Oral Daily   clopidogrel  75 mg Oral Daily   empagliflozin  25 mg Oral Daily   feeding supplement  237 mL Oral BID BM   haloperidol  0.5 mg Oral BID   insulin aspart  0-9 Units Subcutaneous TID WC   mouth rinse  15 mL Mouth Rinse BID   multivitamin with minerals  1 tablet Oral Daily   sertraline  50 mg Oral QHS   sodium chloride flush  3 mL Intravenous Q12H   timolol  1 drop Both Eyes Daily   Continuous Infusions:   Anti-infectives (From admission, onward)    None              Family Communication/Anticipated D/C date and plan/Code Status   DVT prophylaxis: SCDs Start: 08/21/21 0348     Code Status: DNR  Family Communication: Plan discussed with his son and daughter at the bedside Disposition Plan: Plan to discharge to SNF   Status is: Inpatient Remains inpatient appropriate because: Unsafe discharge                      Subjective:   Interval events noted.  Her  family (son and daughter) was at the bedside.  It was reported that patient has been sleepy and they think that Haldol has helped.  Objective:    Vitals:   08/22/21 2005 08/23/21 0223 08/23/21 0510 08/23/21 0900  BP: (!) 129/58 (!) 128/59 104/80 116/65  Pulse: (!) 53 95 (!) 58 (!) 109  Resp: 16 20 16    Temp: 98.8 F (37.1 C) 98.7 F (37.1 C) 98.4 F (36.9 C) 98 F (36.7 C)  TempSrc:  Oral  Oral  SpO2: 93% 94% 93% 95%  Weight:   44.6 kg   Height:       No data found.    Intake/Output Summary (Last 24 hours) at 08/23/2021 1050 Last data filed at 08/22/2021 2106 Gross per 24 hour  Intake 3 ml  Output --  Net 3 ml   Filed Weights   08/21/21 0857 08/23/21 0510   Weight: 47.2 kg 44.6 kg    Exam:  GEN: NAD SKIN: Dressing on left forehead laceration is clean, dry and intact.  Dressing on right leg wound is clean, dry and intact.  Multiple bruises on the face and extremities EYES: Anicteric ENT: MMM CV: RRR PULM: CTA B ABD: soft, ND, +BS CNS: Drowsy EXT: No edema or tenderness          Data Reviewed:   I have personally reviewed following labs and imaging studies:  Labs: Labs show the following:   Basic Metabolic Panel: Recent Labs  Lab 08/21/21 0103 08/22/21 0503  NA 133* 134*  K 4.1 4.1  CL 100 101  CO2 26 25  GLUCOSE 275* 235*  BUN 21 18  CREATININE 0.80 0.56  CALCIUM 8.9 8.6*   GFR Estimated Creatinine Clearance: 27.6 mL/min (by C-G formula based on SCr of 0.56 mg/dL). Liver Function Tests: Recent Labs  Lab 08/21/21 0103  AST 33  ALT 24  ALKPHOS 72  BILITOT 0.9  PROT 7.1  ALBUMIN 3.6   No results for input(s): LIPASE, AMYLASE in the last 168 hours. No results for input(s): AMMONIA in the last 168 hours. Coagulation profile No results for input(s): INR, PROTIME in the last 168 hours.  CBC: Recent Labs  Lab 08/21/21 0103 08/22/21 0503  WBC 10.5 8.9  NEUTROABS 7.9* 6.0  HGB 13.8 12.3  HCT 42.8 36.9  MCV 96.6 93.9  PLT 123* 106*   Cardiac Enzymes: No results for input(s): CKTOTAL, CKMB, CKMBINDEX, TROPONINI in the last 168 hours. BNP (last 3 results) No results for input(s): PROBNP in the last 8760 hours. CBG: Recent Labs  Lab 08/22/21 0903 08/22/21 1154 08/22/21 1642 08/22/21 2108 08/23/21 0906  GLUCAP 241* 177* 155* 144* 133*   D-Dimer: No results for input(s): DDIMER in the last 72 hours. Hgb A1c: Recent Labs    08/21/21 0123  HGBA1C 10.0*   Lipid Profile: No results for input(s): CHOL, HDL, LDLCALC, TRIG, CHOLHDL, LDLDIRECT in the last 72 hours. Thyroid function studies: No results for input(s): TSH, T4TOTAL, T3FREE, THYROIDAB in the last 72 hours.  Invalid input(s):  FREET3 Anemia work up: No results for input(s): VITAMINB12, FOLATE, FERRITIN, TIBC, IRON, RETICCTPCT in the last 72 hours. Sepsis Labs: Recent Labs  Lab 08/21/21 0103 08/22/21 0503  WBC 10.5 8.9    Microbiology Recent Results (from the past 240 hour(s))  Resp Panel by RT-PCR (Flu A&B, Covid) Nasopharyngeal Swab     Status: None   Collection Time: 08/21/21  5:25 AM   Specimen: Nasopharyngeal Swab; Nasopharyngeal(NP) swabs in vial transport medium  Result Value Ref Range Status   SARS Coronavirus 2 by RT PCR NEGATIVE NEGATIVE Final    Comment: (NOTE) SARS-CoV-2 target nucleic acids are NOT DETECTED.  The SARS-CoV-2 RNA is generally detectable in upper respiratory specimens during the acute phase of infection. The lowest concentration of SARS-CoV-2 viral copies this assay can detect is 138 copies/mL. A negative result does not preclude SARS-Cov-2 infection and should not be used as the sole basis for treatment or other patient management decisions. A negative result may occur with  improper specimen collection/handling, submission of specimen other than nasopharyngeal swab, presence of viral mutation(s) within the areas targeted by this assay, and inadequate number of viral copies(<138 copies/mL). A negative result must be combined with clinical observations, patient history, and epidemiological information. The expected result is Negative.  Fact Sheet for Patients:  EntrepreneurPulse.com.au  Fact Sheet for Healthcare Providers:  IncredibleEmployment.be  This test is no t yet approved or cleared by the [place] FDA and  has been authorized for detection and/or diagnosis of SARS-CoV-2 by FDA under an Emergency Use Authorization (EUA). This EUA will remain  in effect (meaning this test can be used) for the duration of the COVID-19 declaration under Section 564(b)(1) of the Act, 21 U.S.C.section 360bbb-3(b)(1), unless the authorization is  terminated  or revoked sooner.       Influenza A by PCR NEGATIVE NEGATIVE Final   Influenza B by PCR NEGATIVE NEGATIVE Final    Comment: (NOTE) The Xpert Xpress SARS-CoV-2/FLU/RSV plus assay is intended as an aid in the diagnosis of influenza from Nasopharyngeal swab specimens and should not be used as a sole basis for treatment. Nasal washings and aspirates are unacceptable for Xpert Xpress SARS-CoV-2/FLU/RSV testing.  Fact Sheet for Patients: EntrepreneurPulse.com.au  Fact Sheet for Healthcare Providers: IncredibleEmployment.be  This test is not yet approved or cleared by the [place] FDA and has been authorized for detection and/or diagnosis of SARS-CoV-2 by FDA under an Emergency Use Authorization (EUA). This EUA will remain in effect (meaning this test can be used) for the duration of the COVID-19 declaration under Section 564(b)(1) of the Act, 21 U.S.C. section 360bbb-3(b)(1), unless the authorization is terminated or revoked.  Performed at [facility], [address] [place], [place] [address]     Procedures and diagnostic studies:  No results found.             LOS: 1 day   Kaydin Karbowski  [facility]   Pager on [email or web address]. If 7PM-7AM, please contact night-coverage at [email or web address]     08/23/2021, 10:50 AM

## 2021-08-23 NOTE — TOC Progression Note (Signed)
Transition of Care [facility]) - Progression Note    Patient Details  Name: Angela Patterson MRN: [id number] Date of Birth: [birth year]  [facility] CM/SW Contact  Izola Price, RN Phone Number: 08/23/2021, 9:43 AM  Clinical Narrative: 2/4: [facility] has accepted, but patient's daughter would like to discuss cost for private room if approved.  Informed Teresa at [facility] of this and supplied daughter's contact number.  Will monitor for discharge readiness over the weekend. Daughter contact information: Marcell Barlow (Daughter) [phone] [facility])  Simmie Davies RN [facility] [phone]   Expected Discharge Plan: [place]    Expected Discharge Plan and Services Expected Discharge Plan: [place] Choice: Durable Medical Equipment Living arrangements for the past 2 months: Single Family Home                                       Social Determinants of Health (SDOH) Interventions    Readmission Risk Interventions No flowsheet data found.

## 2021-08-24 LAB — GLUCOSE, CAPILLARY
Glucose-Capillary: 144 mg/dL — ABNORMAL HIGH (ref 70–99)
Glucose-Capillary: 177 mg/dL — ABNORMAL HIGH (ref 70–99)
Glucose-Capillary: 211 mg/dL — ABNORMAL HIGH (ref 70–99)
Glucose-Capillary: 213 mg/dL — ABNORMAL HIGH (ref 70–99)

## 2021-08-24 NOTE — Progress Notes (Addendum)
Progress Note    Angela Patterson  VOZ:[id number] DOB: 1921/03/11  DOA: 08/21/2021 PCP: Einar Pheasant, MD      Brief Narrative:    Medical records reviewed and are as summarized below:  Angela Patterson is a 88 y.o. female with medical history significant for stroke, dementia, diabetes mellitus, hypertension, glucoma, hearing impairment, chronic right foot ulcer, frequent falls, chronic thrombocytopenia, who presented to the hospital after a mechanical fall at home.  She did not lose consciousness.  She sustained a laceration to the left forehead and the anterior aspect of the right leg.  Laceration on the right leg was sutured in the emergency room.      Assessment/Plan:   Principal Problem:   Fall at home, initial encounter Active Problems:   History of CVA (cerebrovascular accident)   Thrombocytopenia ([place])   Essential hypertension   Type 2 diabetes mellitus (HCC)   Ulcer of right foot with fat layer exposed ([place])   Hearing impairment   Glaucoma   Laceration of eyebrow and forehead, initial encounter   Laceration of right lower leg   Paroxysmal atrial fibrillation (HCC)   Malnutrition of moderate degree    Body mass index is 18.41 kg/m.   S/p fall at home complicated by left forehead and right leg laceration: Right leg laceration was sutured in the emergency room.   Analgesics as needed for pain.  Awaiting placement to SNF.    Hypertension: Continue amlodipine  Type II DM with hyperglycemia: Continue empagliflozin.  She declined sliding scale insulin.  History of stroke: Continue Plavix  Paroxysmal atrial fibrillation : Family declines anticoagulation because of advanced age and increased risk of bleeding  Dementia with behavioral disturbance/delirium: Continue low-dose Haldol and Haldol injection as needed for agitation.  Other comorbidities include dementia, hearing impairment, glaucoma, chronic thrombocytopenia   Diet Order             Diet Carb  Modified Fluid consistency: Thin; Room service appropriate? Yes  Diet effective now                        Consultants: None  Procedures: Suturing of facial and right leg laceration in the emergency room    Medications:    amLODipine  5 mg Oral Daily   clopidogrel  75 mg Oral Daily   empagliflozin  25 mg Oral Daily   feeding supplement  237 mL Oral BID BM   haloperidol  0.5 mg Oral BID   insulin aspart  0-9 Units Subcutaneous TID WC   mouth rinse  15 mL Mouth Rinse BID   multivitamin with minerals  1 tablet Oral Daily   sertraline  50 mg Oral QHS   sodium chloride flush  3 mL Intravenous Q12H   timolol  1 drop Both Eyes Daily   Continuous Infusions:   Anti-infectives (From admission, onward)    None              Family Communication/Anticipated D/C date and plan/Code Status   DVT prophylaxis: SCDs Start: 08/21/21 0348     Code Status: DNR  Family Communication: Plan discussed with her son and daughter at the bedside Disposition Plan: Plan to discharge to SNF tomorrow   Status is: Inpatient Remains inpatient appropriate because: Unsafe discharge                      Subjective:   Interval events noted.  She has no  complaints.  Her son and daughter were at the bedside.   Objective:    Vitals:   08/23/21 2025 08/24/21 0400 08/24/21 0600 08/24/21 0759  BP: 138/73 (!) 120/56  113/70  Pulse: (!) 107 65  (!) 108  Resp: 20 20  18   Temp: 98 F (36.7 C) 98.7 F (37.1 C)  98 F (36.7 C)  TempSrc: Oral Oral  Oral  SpO2: 96% 93%  96%  Weight:   44.2 kg   Height:       No data found.    Intake/Output Summary (Last 24 hours) at 08/24/2021 1121 Last data filed at 08/24/2021 0401 Gross per 24 hour  Intake 3 ml  Output 300 ml  Net -297 ml   Filed Weights   08/21/21 0857 08/23/21 0510 08/24/21 0600  Weight: 47.2 kg 44.6 kg 44.2 kg    Exam:  GEN: NAD SKIN: Dressing on left forehead laceration is clean, dry and  intact.  Dressing on right leg wound is clean and dry as well.  Multiple bruises on the face and extremities EYES: No pallor or icterus ENT: MMM CV: Irregular rate and rhythm PULM: CTA B ABD: soft, ND, NT, +BS CNS: AAO x 2 (person and place), nonfocal EXT: No edema or tenderness         Data Reviewed:   I have personally reviewed following labs and imaging studies:  Labs: Labs show the following:   Basic Metabolic Panel: Recent Labs  Lab 08/21/21 0103 08/22/21 0503  NA 133* 134*  K 4.1 4.1  CL 100 101  CO2 26 25  GLUCOSE 275* 235*  BUN 21 18  CREATININE 0.80 0.56  CALCIUM 8.9 8.6*   GFR Estimated Creatinine Clearance: 27.4 mL/min (by C-G formula based on SCr of 0.56 mg/dL). Liver Function Tests: Recent Labs  Lab 08/21/21 0103  AST 33  ALT 24  ALKPHOS 72  BILITOT 0.9  PROT 7.1  ALBUMIN 3.6   No results for input(s): LIPASE, AMYLASE in the last 168 hours. No results for input(s): AMMONIA in the last 168 hours. Coagulation profile No results for input(s): INR, PROTIME in the last 168 hours.  CBC: Recent Labs  Lab 08/21/21 0103 08/22/21 0503  WBC 10.5 8.9  NEUTROABS 7.9* 6.0  HGB 13.8 12.3  HCT 42.8 36.9  MCV 96.6 93.9  PLT 123* 106*   Cardiac Enzymes: No results for input(s): CKTOTAL, CKMB, CKMBINDEX, TROPONINI in the last 168 hours. BNP (last 3 results) No results for input(s): PROBNP in the last 8760 hours. CBG: Recent Labs  Lab 08/23/21 0906 08/23/21 1135 08/23/21 1617 08/23/21 2236 08/24/21 0801  GLUCAP 133* 179* 202* 131* 144*   D-Dimer: No results for input(s): DDIMER in the last 72 hours. Hgb A1c: No results for input(s): HGBA1C in the last 72 hours.  Lipid Profile: No results for input(s): CHOL, HDL, LDLCALC, TRIG, CHOLHDL, LDLDIRECT in the last 72 hours. Thyroid function studies: No results for input(s): TSH, T4TOTAL, T3FREE, THYROIDAB in the last 72 hours.  Invalid input(s): FREET3 Anemia work up: No results for  input(s): VITAMINB12, FOLATE, FERRITIN, TIBC, IRON, RETICCTPCT in the last 72 hours. Sepsis Labs: Recent Labs  Lab 08/21/21 0103 08/22/21 0503  WBC 10.5 8.9    Microbiology Recent Results (from the past 240 hour(s))  Resp Panel by RT-PCR (Flu A&B, Covid) Nasopharyngeal Swab     Status: None   Collection Time: 08/21/21  5:25 AM   Specimen: Nasopharyngeal Swab; Nasopharyngeal(NP) swabs in vial transport medium  Result Value Ref Range Status   SARS Coronavirus 2 by RT PCR NEGATIVE NEGATIVE Final    Comment: (NOTE) SARS-CoV-2 target nucleic acids are NOT DETECTED.  The SARS-CoV-2 RNA is generally detectable in upper respiratory specimens during the acute phase of infection. The lowest concentration of SARS-CoV-2 viral copies this assay can detect is 138 copies/mL. A negative result does not preclude SARS-Cov-2 infection and should not be used as the sole basis for treatment or other patient management decisions. A negative result may occur with  improper specimen collection/handling, submission of specimen other than nasopharyngeal swab, presence of viral mutation(s) within the areas targeted by this assay, and inadequate number of viral copies(<138 copies/mL). A negative result must be combined with clinical observations, patient history, and epidemiological information. The expected result is Negative.  Fact Sheet for Patients:  EntrepreneurPulse.com.au  Fact Sheet for Healthcare Providers:  IncredibleEmployment.be  This test is no t yet approved or cleared by the [place] FDA and  has been authorized for detection and/or diagnosis of SARS-CoV-2 by FDA under an Emergency Use Authorization (EUA). This EUA will remain  in effect (meaning this test can be used) for the duration of the COVID-19 declaration under Section 564(b)(1) of the Act, 21 U.S.C.section 360bbb-3(b)(1), unless the authorization is terminated  or revoked sooner.        Influenza A by PCR NEGATIVE NEGATIVE Final   Influenza B by PCR NEGATIVE NEGATIVE Final    Comment: (NOTE) The Xpert Xpress SARS-CoV-2/FLU/RSV plus assay is intended as an aid in the diagnosis of influenza from Nasopharyngeal swab specimens and should not be used as a sole basis for treatment. Nasal washings and aspirates are unacceptable for Xpert Xpress SARS-CoV-2/FLU/RSV testing.  Fact Sheet for Patients: EntrepreneurPulse.com.au  Fact Sheet for Healthcare Providers: IncredibleEmployment.be  This test is not yet approved or cleared by the [place] FDA and has been authorized for detection and/or diagnosis of SARS-CoV-2 by FDA under an Emergency Use Authorization (EUA). This EUA will remain in effect (meaning this test can be used) for the duration of the COVID-19 declaration under Section 564(b)(1) of the Act, 21 U.S.C. section 360bbb-3(b)(1), unless the authorization is terminated or revoked.  Performed at [facility], [address] [place], [place] [address]     Procedures and diagnostic studies:  No results found.             LOS: 2 days   Yusuf Yu  [facility]   Pager on [email or web address]. If 7PM-7AM, please contact night-coverage at [email or web address]     08/24/2021, 11:21 AM

## 2021-08-25 ENCOUNTER — Telehealth: Payer: Self-pay | Admitting: Pharmacy Technician

## 2021-08-25 DIAGNOSIS — R1312 Dysphagia, oropharyngeal phase: Secondary | ICD-10-CM | POA: Diagnosis not present

## 2021-08-25 DIAGNOSIS — E1165 Type 2 diabetes mellitus with hyperglycemia: Secondary | ICD-10-CM | POA: Diagnosis not present

## 2021-08-25 DIAGNOSIS — R488 Other symbolic dysfunctions: Secondary | ICD-10-CM | POA: Diagnosis not present

## 2021-08-25 DIAGNOSIS — Z736 Limitation of activities due to disability: Secondary | ICD-10-CM | POA: Diagnosis not present

## 2021-08-25 DIAGNOSIS — I70212 Atherosclerosis of native arteries of extremities with intermittent claudication, left leg: Secondary | ICD-10-CM | POA: Diagnosis not present

## 2021-08-25 DIAGNOSIS — Z8673 Personal history of transient ischemic attack (TIA), and cerebral infarction without residual deficits: Secondary | ICD-10-CM | POA: Diagnosis not present

## 2021-08-25 DIAGNOSIS — U071 COVID-19: Secondary | ICD-10-CM | POA: Diagnosis not present

## 2021-08-25 DIAGNOSIS — R319 Hematuria, unspecified: Secondary | ICD-10-CM | POA: Diagnosis not present

## 2021-08-25 DIAGNOSIS — I10 Essential (primary) hypertension: Secondary | ICD-10-CM | POA: Diagnosis not present

## 2021-08-25 DIAGNOSIS — Y92009 Unspecified place in unspecified non-institutional (private) residence as the place of occurrence of the external cause: Secondary | ICD-10-CM | POA: Diagnosis not present

## 2021-08-25 DIAGNOSIS — M6259 Muscle wasting and atrophy, not elsewhere classified, multiple sites: Secondary | ICD-10-CM | POA: Diagnosis not present

## 2021-08-25 DIAGNOSIS — S81801D Unspecified open wound, right lower leg, subsequent encounter: Secondary | ICD-10-CM | POA: Diagnosis not present

## 2021-08-25 DIAGNOSIS — Z515 Encounter for palliative care: Secondary | ICD-10-CM | POA: Diagnosis not present

## 2021-08-25 DIAGNOSIS — E1140 Type 2 diabetes mellitus with diabetic neuropathy, unspecified: Secondary | ICD-10-CM | POA: Diagnosis not present

## 2021-08-25 DIAGNOSIS — S81801A Unspecified open wound, right lower leg, initial encounter: Secondary | ICD-10-CM | POA: Diagnosis not present

## 2021-08-25 DIAGNOSIS — L97319 Non-pressure chronic ulcer of right ankle with unspecified severity: Secondary | ICD-10-CM | POA: Diagnosis not present

## 2021-08-25 DIAGNOSIS — R404 Transient alteration of awareness: Secondary | ICD-10-CM | POA: Diagnosis not present

## 2021-08-25 DIAGNOSIS — E782 Mixed hyperlipidemia: Secondary | ICD-10-CM | POA: Diagnosis not present

## 2021-08-25 DIAGNOSIS — I70233 Atherosclerosis of native arteries of right leg with ulceration of ankle: Secondary | ICD-10-CM | POA: Diagnosis not present

## 2021-08-25 DIAGNOSIS — Z7401 Bed confinement status: Secondary | ICD-10-CM | POA: Diagnosis not present

## 2021-08-25 DIAGNOSIS — M542 Cervicalgia: Secondary | ICD-10-CM | POA: Diagnosis not present

## 2021-08-25 DIAGNOSIS — H409 Unspecified glaucoma: Secondary | ICD-10-CM | POA: Diagnosis not present

## 2021-08-25 DIAGNOSIS — R498 Other voice and resonance disorders: Secondary | ICD-10-CM | POA: Diagnosis not present

## 2021-08-25 DIAGNOSIS — S81811D Laceration without foreign body, right lower leg, subsequent encounter: Secondary | ICD-10-CM | POA: Diagnosis not present

## 2021-08-25 DIAGNOSIS — K219 Gastro-esophageal reflux disease without esophagitis: Secondary | ICD-10-CM | POA: Diagnosis not present

## 2021-08-25 DIAGNOSIS — F3289 Other specified depressive episodes: Secondary | ICD-10-CM | POA: Diagnosis not present

## 2021-08-25 DIAGNOSIS — E440 Moderate protein-calorie malnutrition: Secondary | ICD-10-CM | POA: Diagnosis not present

## 2021-08-25 DIAGNOSIS — R531 Weakness: Secondary | ICD-10-CM | POA: Diagnosis not present

## 2021-08-25 DIAGNOSIS — D696 Thrombocytopenia, unspecified: Secondary | ICD-10-CM | POA: Diagnosis not present

## 2021-08-25 DIAGNOSIS — R2681 Unsteadiness on feet: Secondary | ICD-10-CM | POA: Diagnosis not present

## 2021-08-25 DIAGNOSIS — W19XXXA Unspecified fall, initial encounter: Secondary | ICD-10-CM | POA: Diagnosis not present

## 2021-08-25 DIAGNOSIS — R296 Repeated falls: Secondary | ICD-10-CM | POA: Diagnosis not present

## 2021-08-25 DIAGNOSIS — I480 Paroxysmal atrial fibrillation: Secondary | ICD-10-CM | POA: Diagnosis not present

## 2021-08-25 DIAGNOSIS — L97311 Non-pressure chronic ulcer of right ankle limited to breakdown of skin: Secondary | ICD-10-CM | POA: Diagnosis not present

## 2021-08-25 DIAGNOSIS — H9193 Unspecified hearing loss, bilateral: Secondary | ICD-10-CM | POA: Diagnosis not present

## 2021-08-25 DIAGNOSIS — Z596 Low income: Secondary | ICD-10-CM

## 2021-08-25 DIAGNOSIS — M6281 Muscle weakness (generalized): Secondary | ICD-10-CM | POA: Diagnosis not present

## 2021-08-25 DIAGNOSIS — N329 Bladder disorder, unspecified: Secondary | ICD-10-CM | POA: Diagnosis not present

## 2021-08-25 DIAGNOSIS — S0093XD Contusion of unspecified part of head, subsequent encounter: Secondary | ICD-10-CM | POA: Diagnosis not present

## 2021-08-25 DIAGNOSIS — R5381 Other malaise: Secondary | ICD-10-CM | POA: Diagnosis not present

## 2021-08-25 LAB — GLUCOSE, CAPILLARY
Glucose-Capillary: 170 mg/dL — ABNORMAL HIGH (ref 70–99)
Glucose-Capillary: 155 mg/dL — ABNORMAL HIGH (ref 70–99)

## 2021-08-25 NOTE — Evaluation (Signed)
Occupational Therapy Treatment Patient Details Name: Angela Patterson MRN: [id number] DOB: 05-23-1921 Today's Date: 08/25/2021   History of Present Illness Pt is a 88 yo female that presented to the ED from a fall, admitted for observation for potential syncopal workup. PMH of CVA, DM, HTN, hearing impaired, frequent falls, thrombocytopenia, and chronic R foot ulcer.   Clinical Impression    Pt. Performed self-grooming skills Independently with set-up. Pt. Required minA donning a fresh gown, MinA with cues for supine to sit to the EOB with presented with good unsupported sitting balance at the EOB, Pt. Required ModA sit to stand from the EOB, MinA to transfer from the bed to the BSCommode, MinA to stand from the BSCommode, and transfer beck to the bed. Pt. Required Mod A for toilet hygiene care, and clothing negotiation skills following toileting. Pt. is HOH, and required verbal, and tactile cues.  Pt. Continues to benefit from OT services for ADL training, A/E training, and pt. Education about home modification, and DME. Pt. Continues to be most appropriate for SNF level of care upon discharge, with follow-up OT services.      Recommendations for follow up therapy are one component of a multi-disciplinary discharge planning process, led by the attending physician.  Recommendations may be updated based on patient status, additional functional criteria and insurance authorization.   Follow Up Recommendations  Skilled nursing-short term rehab (<3 hours/day)    Assistance Recommended at Discharge Frequent or constant Supervision/Assistance  Patient can return home with the following A lot of help with bathing/dressing/bathroom;A little help with walking and/or transfers    Functional Status Assessment  Patient has had a recent decline in their functional status and demonstrates the ability to make significant improvements in function in a reasonable and predictable amount of time.  Equipment  Recommendations  BSC/3in1    Recommendations for Other Services       Precautions / Restrictions        Mobility Bed Mobility Overal bed mobility: Needs Assistance       Supine to sit: Min assist Sit to supine: Min assist        Transfers     Transfers: Sit to/from Stand Sit to Stand: Min assist, Mod assist (ModA to stand from the bed, MinA to stand from the BSCommode) Stand pivot transfers: Min assist                Balance  Good unsupported sitting balance at the EOB  Poor standing balance                                         ADL either performed or assessed with clinical judgement   ADL       Grooming: Independent;Wash/dry face;Set up Grooming Details (indicate cue type and reason): Hand hygiene with set-up         Upper Body Dressing : Set up;Minimal assistance Upper Body Dressing Details (indicate cue type and reason): Gown     Toilet Transfer: Minimal assistance   Toileting- Clothing Manipulation and Hygiene: Moderate assistance       Functional mobility during ADLs: Minimal assistance;Rolling walker (2 wheels) General ADL Comments: Increased time, and cues for standing balance     Vision Baseline Vision/History: 1 Wears glasses Patient Visual Report: No change from baseline       Perception     Praxis  Pertinent Vitals/Pain Pain Assessment Pain Assessment: No/denies pain     Hand Dominance     Extremity/Trunk Assessment Upper Extremity Assessment Upper Extremity Assessment: Generalized weakness           Communication     Cognition Arousal/Alertness: Awake/alert Behavior During Therapy: WFL for tasks assessed/performed                                   General Comments: Pt. is HOH     General Comments       Exercises     Shoulder Instructions      Home Living                                          Prior Functioning/Environment                           OT Problem List: Decreased strength;Decreased range of motion;Decreased activity tolerance;Impaired balance (sitting and/or standing);Decreased safety awareness;Decreased knowledge of use of DME or AE      OT Treatment/Interventions: Self-care/ADL training;Therapeutic exercise;Therapeutic activities;Energy conservation;Visual/perceptual remediation/compensation;DME and/or AE instruction;Patient/family education;Balance training    OT Goals(Current goals can be found in the care plan section)    OT Frequency: Min 2X/week    Co-evaluation              AM-PAC OT "6 Clicks" Daily Activity     Outcome Measure Help from another person eating meals?: A Little Help from another person taking care of personal grooming?: A Little Help from another person toileting, which includes using toliet, bedpan, or urinal?: A Little Help from another person bathing (including washing, rinsing, drying)?: A Lot Help from another person to put on and taking off regular upper body clothing?: A Little Help from another person to put on and taking off regular lower body clothing?: A Lot 6 Click Score: 16   End of Session Equipment Utilized During Treatment: Rolling walker (2 wheels) Nurse Communication: Mobility status  Activity Tolerance: Patient limited by lethargy;Patient limited by pain Patient left: in bed;with call bell/phone within reach;with bed alarm set;with family/visitor present  OT Visit Diagnosis: Unsteadiness on feet (R26.81);Repeated falls (R29.6);Muscle weakness (generalized) (M62.81)                Time: 7619-5093 OT Time Calculation (min): 38 min Charges:  OT General Charges $OT Visit: 1 Visit OT Treatments $Self Care/Home Management : 38-52 mins Harrel Carina, MS, OTR/L  Harrel Carina 08/25/2021, 11:36 AM

## 2021-08-25 NOTE — Progress Notes (Addendum)
Occupational Therapy Treatment Patient Details Name: Angela Patterson MRN: [id number] DOB: 14-Aug-1920 Today's Date: 08/25/2021   History of present illness Pt is a 88 yo female that presented to the ED from a fall, admitted for observation for potential syncopal workup. PMH of CVA, DM, HTN, hearing impaired, frequent falls, thrombocytopenia, and chronic R foot ulcer.   OT comments  Pt. Was independently able to perform self grooming skills independently with set-up. Pt. Required minA for bed mobility to perform supine to sit to the EOB. Pt. Required minA to donn a fresh gown. Pt. Was able to sit unsupported with good sitting balance at the EOB. Pt. Required ModA to perform sit to stand from the EOB, MinA transfers to and from the [place]. MinA to stand from the [place]. Pt. Requires cues for hand placement. Pt. Required ModA for toilet hygiene care, and for clothing negotiation. Pt. Continues to benefit from OT services for ADL training, A/E training, and pt. Education about home modification, and DME. SNF level of care continues to be the most appropriate placement upon discharge, with follow-up OT services.    Recommendations for follow up therapy are one component of a multi-disciplinary discharge planning process, led by the attending physician.  Recommendations may be updated based on patient status, additional functional criteria and insurance authorization.    Follow Up Recommendations  Skilled nursing-short term rehab (<3 hours/day)    Assistance Recommended at Discharge Frequent or constant Supervision/Assistance  Patient can return home with the following  A lot of help with bathing/dressing/bathroom;A little help with walking and/or transfers   Equipment Recommendations  BSC/3in1    Recommendations for Other Services      Precautions / Restrictions   Fall      Mobility Bed Mobility Overal bed mobility: Needs Assistance       Supine to sit: Min assist Sit to supine: Min  assist        Transfers     Transfers: Sit to/from Stand Sit to Stand: Min assist, Mod assist (ModA to stand from the bed, MinA to stand from the BSCommode) Stand pivot transfers: Min assist              Pt. Demonstrated good unsupported sitting balance at the EOB  Pt. Demonstrated poor standing balance  Balance                                           ADL either performed or assessed with clinical judgement   ADL       Grooming: Independent;Wash/dry face;Set up Grooming Details (indicate cue type and reason): Hand hygiene with set-up         Upper Body Dressing : Set up;Minimal assistance Upper Body Dressing Details (indicate cue type and reason): Gown     Toilet Transfer: Minimal assistance   Toileting- Clothing Manipulation and Hygiene: Moderate assistance       Functional mobility during ADLs: Minimal assistance;Rolling walker (2 wheels) General ADL Comments: Increased time, and cues for standing balance    Extremity/Trunk Assessment Upper Extremity Assessment Upper Extremity Assessment: Generalized weakness            Vision Baseline Vision/History: 1 Wears glasses Patient Visual Report: No change from baseline     Perception     Praxis      Cognition Arousal/Alertness: Awake/alert Behavior During Therapy: WFL for tasks assessed/performed  General Comments: Pt. is [facility]        Exercises      Shoulder Instructions       General Comments      Pertinent Vitals/ Pain       Pain Assessment Pain Assessment: No/denies pain  Home Living                                          Prior Functioning/Environment              Frequency  Min 2X/week        Progress Toward Goals  OT Goals(current goals can now be found in the care plan section)        Plan      Co-evaluation                 AM-PAC OT "6 Clicks" Daily Activity      Outcome Measure   Help from another person eating meals?: A Little Help from another person taking care of personal grooming?: A Little Help from another person toileting, which includes using toliet, bedpan, or urinal?: A Little Help from another person bathing (including washing, rinsing, drying)?: A Lot Help from another person to put on and taking off regular upper body clothing?: A Little Help from another person to put on and taking off regular lower body clothing?: A Lot 6 Click Score: 16    End of Session Equipment Utilized During Treatment: Rolling walker (2 wheels)  OT Visit Diagnosis: Unsteadiness on feet (R26.81);Repeated falls (R29.6);Muscle weakness (generalized) (M62.81)   Activity Tolerance Patient limited by lethargy;Patient limited by pain   Patient Left in bed;with call bell/phone within reach;with bed alarm set;with family/visitor present   Nurse Communication Mobility status        Time: 0539-7673 OT Time Calculation (min): 38 min  Charges: OT General Charges $OT Visit: 1 Visit OT Treatments $Self Care/Home Management : 38-52 mins  Harrel Carina, MS, OTR/L   Harrel Carina 08/25/2021, 11:46 AM

## 2021-08-25 NOTE — TOC Transition Note (Signed)
Transition of Care [facility]) - CM/SW Discharge Note   Patient Details  Name: Angela Patterson MRN: [id number] Date of Birth: 08-20-1920  [facility] of [facility]) CM/SW Contact:  Beverly Sessions, RN Phone Number: 08/25/2021, 12:52 PM   Clinical Narrative:      [facility] will [facility] to: [facility] Anticipated DC date:08/25/21  Family notified:Teresa  Transport by: EMS  Per MD patient ready for DC to . RN, patient, patient's family, and facility notified of DC. Discharge Summary sent to facility. RN given number for report. DC packet on chart. Ambulance transport requested for patient.  TOC signing off.  Isaias Cowman [facility] [phone]    Barriers to Discharge: Other (must enter comment) (SNF cannot accept patient till Monday 08/25/21)   Patient Goals and CMS Choice     Choice offered to / list presented to : Patient  Discharge Placement                       Discharge Plan and Services     Post Acute Care Choice: Durable Medical Equipment                               Social Determinants of Health (SDOH) Interventions     Readmission Risk Interventions No flowsheet data found.

## 2021-08-25 NOTE — Progress Notes (Signed)
Report was called to [facility] and given to Winifred Olive.

## 2021-08-25 NOTE — Plan of Care (Signed)

## 2021-08-25 NOTE — Discharge Summary (Addendum)
Physician Discharge Summary  EMMALIN JAQUESS PZW:[id number] DOB: August 04, 1920 DOA: 08/21/2021  PCP: Einar Pheasant, MD  Admit date: 08/21/2021 Discharge date: 08/25/2021  Discharge disposition: SNF   Recommendations for Outpatient Follow-Up:   Follow-up with podiatrist for right foot wound Follow-up with physician at the nursing home within 3 days of discharge Follow-up with PCP in 1 to 2 weeks for routine health maintenance   Discharge Diagnosis:   Principal Problem:   Fall at home, initial encounter Active Problems:   History of CVA (cerebrovascular accident)   Thrombocytopenia ([place])   Essential hypertension   Type 2 diabetes mellitus ([place])   Ulcer of right foot with fat layer exposed ([place])   Hearing impairment   Glaucoma   Laceration of eyebrow and forehead, initial encounter   Laceration of right lower leg   Paroxysmal atrial fibrillation ([facility])   Malnutrition of moderate degree    Discharge Condition: Stable.  Diet recommendation:  Diet Order             Diet - low sodium heart healthy           Diet Carb Modified           Diet Carb Modified Fluid consistency: Thin; Room service appropriate? Yes  Diet effective now                     Code Status: DNR     Hospital Course:   Ms. REIDA HEM is a 88 y.o. female with medical history significant for stroke, dementia, diabetes mellitus, hypertension, glucoma, hearing impairment, chronic right foot ulcer, frequent falls, chronic thrombocytopenia, who presented to the hospital after a mechanical fall at home.  She did not lose consciousness.  She sustained a laceration to the left forehead and the anterior aspect of the right leg.  Lacerations on the face and right leg were sutured in the emergency room.  Hospital course was complicated by delirium/agitation.  This improved with Haldol.  She also had paroxysmal atrial fibrillation which is probably new.  Her family declined anticoagulation for stroke  prophylaxis because of advanced age and increased risk of bleeding.  She was evaluated by PT and OT who recommended further rehabilitation at a skilled nursing facility.  Her condition has improved and she is deemed stable for discharge to SNF today.  Discharge plan was discussed with the patient and [name], daughter, at the bedside.      Discharge Exam:    Vitals:   08/24/21 2003 08/25/21 0452 08/25/21 0600 08/25/21 0834  BP: 119/80 136/77  133/85  Pulse: (!) 108 62  (!) 103  Resp: 18 18  16   Temp: 98.2 F (36.8 C) 98.1 F (36.7 C)  97.6 F (36.4 C)  TempSrc:    Oral  SpO2: 94% 94%  94%  Weight:   46.5 kg   Height:         GEN: NAD SKIN: Warm and dry EYES: No pallor or icterus ENT: MMM CV: RRR PULM: CTA B ABD: soft, ND, NT, +BS CNS: AAO x 3, non focal EXT: No edema or tenderness   The results of significant diagnostics from this hospitalization (including imaging, microbiology, ancillary and laboratory) are listed below for reference.     Procedures and Diagnostic Studies:   DG Chest 2 View  Result Date: 08/21/2021 CLINICAL DATA:  Fall EXAM: CHEST - 2 VIEW COMPARISON:  12/20/2020 FINDINGS: Lungs are clear.  No pleural effusion or pneumothorax. The heart is normal  in size.  Thoracic aortic atherosclerosis. Visualized osseous structures are within normal limits. IMPRESSION: Normal chest radiographs. Electronically Signed   By: Julian Hy M.D.   On: 08/21/2021 01:22   DG Tibia/Fibula Right  Result Date: 08/21/2021 CLINICAL DATA:  Fall, right leg pain EXAM: RIGHT TIBIA AND FIBULA - 2 VIEW COMPARISON:  None. FINDINGS: No fracture or dislocation is seen. The joint spaces are preserved. Soft tissue laceration along the anterior aspect of the mid tibia. No radiopaque foreign body is seen. IMPRESSION: Soft tissue laceration along the anterior aspect of the mid tibia. No fracture, dislocation, or radiopaque foreign body is seen. Electronically Signed   By: Julian Hy  M.D.   On: 08/21/2021 01:22   CT Head Wo Contrast  Result Date: 08/21/2021 CLINICAL DATA:  Unwitnessed fall EXAM: CT HEAD WITHOUT CONTRAST CT CERVICAL SPINE WITHOUT CONTRAST TECHNIQUE: Multidetector CT imaging of the head and cervical spine was performed following the standard protocol without intravenous contrast. Multiplanar CT image reconstructions of the cervical spine were also generated. RADIATION DOSE REDUCTION: This exam was performed according to the departmental dose-optimization program which includes automated exposure control, adjustment of the mA and/or kV according to patient size and/or use of iterative reconstruction technique. COMPARISON:  CT head dated 10/18/2020 FINDINGS: CT HEAD FINDINGS Brain: No evidence of acute infarction, hemorrhage, hydrocephalus, extra-axial collection or mass lesion/mass effect. Global cortical and central atrophy. Secondary ventricular prominence. Subcortical white matter and periventricular small vessel ischemic changes. Vascular: Intracranial atherosclerosis. Skull: Normal. Negative for fracture or focal lesion. Sinuses/Orbits: The visualized paranasal sinuses are essentially clear. The mastoid air cells are unopacified. Other: Small hematoma overlying the left frontal bone (series 2/image 11). CT CERVICAL SPINE FINDINGS Alignment: Reversal the normal lower cervical lordosis, likely positional. Skull base and vertebrae: No acute fracture. No primary bone lesion or focal pathologic process. Soft tissues and spinal canal: No prevertebral fluid or swelling. No visible canal hematoma. Disc levels: Mild degenerative changes of the mid/lower cervical spine. Spinal canal is patent. Upper chest: 4 mm subpleural nodule in the anterior left lung apex (series 2/image 76). This is of questionable clinical significance given the patient's age, but if warranted, follow-up CT chest could be performed in 1 year. Other: Visualized thyroid is unremarkable. IMPRESSION: Small hematoma  overlying the left frontal bone. No evidence of calvarial fracture. No evidence of acute intracranial abnormality. Atrophy with small vessel ischemic changes. No evidence of traumatic injury to the cervical spine. Mild degenerative changes of the mid/lower cervical spine. Electronically Signed   By: Julian Hy M.D.   On: 08/21/2021 01:41   CT Cervical Spine Wo Contrast  Result Date: 08/21/2021 CLINICAL DATA:  Unwitnessed fall EXAM: CT HEAD WITHOUT CONTRAST CT CERVICAL SPINE WITHOUT CONTRAST TECHNIQUE: Multidetector CT imaging of the head and cervical spine was performed following the standard protocol without intravenous contrast. Multiplanar CT image reconstructions of the cervical spine were also generated. RADIATION DOSE REDUCTION: This exam was performed according to the departmental dose-optimization program which includes automated exposure control, adjustment of the mA and/or kV according to patient size and/or use of iterative reconstruction technique. COMPARISON:  CT head dated 10/18/2020 FINDINGS: CT HEAD FINDINGS Brain: No evidence of acute infarction, hemorrhage, hydrocephalus, extra-axial collection or mass lesion/mass effect. Global cortical and central atrophy. Secondary ventricular prominence. Subcortical white matter and periventricular small vessel ischemic changes. Vascular: Intracranial atherosclerosis. Skull: Normal. Negative for fracture or focal lesion. Sinuses/Orbits: The visualized paranasal sinuses are essentially clear. The mastoid air cells are unopacified.  Other: Small hematoma overlying the left frontal bone (series 2/image 11). CT CERVICAL SPINE FINDINGS Alignment: Reversal the normal lower cervical lordosis, likely positional. Skull base and vertebrae: No acute fracture. No primary bone lesion or focal pathologic process. Soft tissues and spinal canal: No prevertebral fluid or swelling. No visible canal hematoma. Disc levels: Mild degenerative changes of the mid/lower  cervical spine. Spinal canal is patent. Upper chest: 4 mm subpleural nodule in the anterior left lung apex (series 2/image 76). This is of questionable clinical significance given the patient's age, but if warranted, follow-up CT chest could be performed in 1 year. Other: Visualized thyroid is unremarkable. IMPRESSION: Small hematoma overlying the left frontal bone. No evidence of calvarial fracture. No evidence of acute intracranial abnormality. Atrophy with small vessel ischemic changes. No evidence of traumatic injury to the cervical spine. Mild degenerative changes of the mid/lower cervical spine. Electronically Signed   By: Julian Hy M.D.   On: 08/21/2021 01:41     Labs:   Basic Metabolic Panel: Recent Labs  Lab 08/21/21 0103 08/22/21 0503  NA 133* 134*  K 4.1 4.1  CL 100 101  CO2 26 25  GLUCOSE 275* 235*  BUN 21 18  CREATININE 0.80 0.56  CALCIUM 8.9 8.6*   GFR Estimated Creatinine Clearance: 28.8 mL/min (by C-G formula based on SCr of 0.56 mg/dL). Liver Function Tests: Recent Labs  Lab 08/21/21 0103  AST 33  ALT 24  ALKPHOS 72  BILITOT 0.9  PROT 7.1  ALBUMIN 3.6   No results for input(s): LIPASE, AMYLASE in the last 168 hours. No results for input(s): AMMONIA in the last 168 hours. Coagulation profile No results for input(s): INR, PROTIME in the last 168 hours.  CBC: Recent Labs  Lab 08/21/21 0103 08/22/21 0503  WBC 10.5 8.9  NEUTROABS 7.9* 6.0  HGB 13.8 12.3  HCT 42.8 36.9  MCV 96.6 93.9  PLT 123* 106*   Cardiac Enzymes: No results for input(s): CKTOTAL, CKMB, CKMBINDEX, TROPONINI in the last 168 hours. BNP: Invalid input(s): POCBNP CBG: Recent Labs  Lab 08/24/21 0801 08/24/21 1212 08/24/21 1628 08/24/21 2101 08/25/21 0824  GLUCAP 144* 177* 211* 213* 170*   D-Dimer No results for input(s): DDIMER in the last 72 hours. Hgb A1c No results for input(s): HGBA1C in the last 72 hours. Lipid Profile No results for input(s): CHOL, HDL,  LDLCALC, TRIG, CHOLHDL, LDLDIRECT in the last 72 hours. Thyroid function studies No results for input(s): TSH, T4TOTAL, T3FREE, THYROIDAB in the last 72 hours.  Invalid input(s): FREET3 Anemia work up No results for input(s): VITAMINB12, FOLATE, FERRITIN, TIBC, IRON, RETICCTPCT in the last 72 hours. Microbiology Recent Results (from the past 240 hour(s))  Resp Panel by RT-PCR (Flu A&B, Covid) Nasopharyngeal Swab     Status: None   Collection Time: 08/21/21  5:25 AM   Specimen: Nasopharyngeal Swab; Nasopharyngeal(NP) swabs in vial transport medium  Result Value Ref Range Status   SARS Coronavirus 2 by RT PCR NEGATIVE NEGATIVE Final    Comment: (NOTE) SARS-CoV-2 target nucleic acids are NOT DETECTED.  The SARS-CoV-2 RNA is generally detectable in upper respiratory specimens during the acute phase of infection. The lowest concentration of SARS-CoV-2 viral copies this assay can detect is 138 copies/mL. A negative result does not preclude SARS-Cov-2 infection and should not be used as the sole basis for treatment or other patient management decisions. A negative result may occur with  improper specimen collection/handling, submission of specimen other than nasopharyngeal swab, presence of  viral mutation(s) within the areas targeted by this assay, and inadequate number of viral copies(<138 copies/mL). A negative result must be combined with clinical observations, patient history, and epidemiological information. The expected result is Negative.  Fact Sheet for Patients:  EntrepreneurPulse.com.au  Fact Sheet for Healthcare Providers:  IncredibleEmployment.be  This test is no t yet approved or cleared by the [place] FDA and  has been authorized for detection and/or diagnosis of SARS-CoV-2 by FDA under an Emergency Use Authorization (EUA). This EUA will remain  in effect (meaning this test can be used) for the duration of the COVID-19  declaration under Section 564(b)(1) of the Act, 21 U.S.C.section 360bbb-3(b)(1), unless the authorization is terminated  or revoked sooner.       Influenza A by PCR NEGATIVE NEGATIVE Final   Influenza B by PCR NEGATIVE NEGATIVE Final    Comment: (NOTE) The Xpert Xpress SARS-CoV-2/FLU/RSV plus assay is intended as an aid in the diagnosis of influenza from Nasopharyngeal swab specimens and should not be used as a sole basis for treatment. Nasal washings and aspirates are unacceptable for Xpert Xpress SARS-CoV-2/FLU/RSV testing.  Fact Sheet for Patients: EntrepreneurPulse.com.au  Fact Sheet for Healthcare Providers: IncredibleEmployment.be  This test is not yet approved or cleared by the [place] FDA and has been authorized for detection and/or diagnosis of SARS-CoV-2 by FDA under an Emergency Use Authorization (EUA). This EUA will remain in effect (meaning this test can be used) for the duration of the COVID-19 declaration under Section 564(b)(1) of the Act, 21 U.S.C. section 360bbb-3(b)(1), unless the authorization is terminated or revoked.  Performed at [facility], [address] [place], [place] [address]      Discharge Instructions:   Discharge Instructions     Diet - low sodium heart healthy   Complete by: As directed    Diet Carb Modified   Complete by: As directed    Discharge instructions   Complete by: As directed    Sutures on the face and right leg can be removed on 08/28/2021   Discharge wound care:   Complete by: As directed    Sutures on the face and right leg can be removed on 08/28/2021   Increase activity slowly   Complete by: As directed       Allergies as of 08/25/2021       Reactions   Actos [pioglitazone] Swelling   Contrast Media [iodinated Contrast Media] Swelling   Prandin [repaglinide] Swelling   Pravastatin Sodium    cramps        Medication List     STOP taking these  medications    doxycycline 100 MG tablet Commonly known as: VIBRA-TABS       TAKE these medications    amLODipine 5 MG tablet Commonly known as: NORVASC TAKE 1 TABLET(5 MG) BY MOUTH TWICE DAILY   clopidogrel 75 MG tablet Commonly known as: PLAVIX TAKE 1 TABLET(75 MG) BY MOUTH DAILY   Cranberry 500 MG Chew Chew 1 each by mouth daily.   empagliflozin 25 MG Tabs tablet Commonly known as: Jardiance Take 1 tablet (25 mg total) by mouth daily.   gentamicin cream 0.1 % Commonly known as: GARAMYCIN Apply 1 application topically 2 (two) times daily.   glucose blood test strip OneTouch Ultra Test strips   glucose blood test strip USE TO CHECK BLOOD SUGAR  TWO TIMES DAILY. Dx E11.9   multivitamin tablet Take 1 tablet by mouth daily.   OMEGA-3 FATTY ACIDS PO Take 720  mg by mouth daily.   ONE TOUCH DELICA LANCING DEV Misc Use twice daily Dx: 250.00   rosuvastatin 5 MG tablet Commonly known as: CRESTOR TAKE 1 TABLET(5 MG) BY MOUTH 2 TIMES A WEEK   sertraline 25 MG tablet Commonly known as: ZOLOFT Take 1-2 tablets (25-50 mg total) by mouth daily. What changed: how much to take   timolol 0.5 % ophthalmic solution Commonly known as: BETIMOL 1 drop daily.   Vitamin D 50 MCG (2000 UT) tablet Take 2,000 Units by mouth daily.               Discharge Care Instructions  (From admission, onward)           Start     Ordered   08/25/21 0000  Discharge wound care:       Comments: Sutures on the face and right leg can be removed on 08/28/2021   08/25/21 1048               If you experience worsening of your admission symptoms, develop shortness of breath, life threatening emergency, suicidal or homicidal thoughts you must seek medical attention immediately by calling 911 or calling your MD immediately  if symptoms less severe.   You must read complete instructions/literature along with all the possible adverse reactions/side effects for all the medicines  you take and that have been prescribed to you. Take any new medicines after you have completely understood and accept all the possible adverse reactions/side effects.    Please note   You were cared for by a hospitalist during your hospital stay. If you have any questions about your discharge medications or the care you received while you were in the hospital after you are discharged, you can call the unit and asked to speak with the hospitalist on call if the hospitalist that took care of you is not available. Once you are discharged, your primary care physician will handle any further medical issues. Please note that NO REFILLS for any discharge medications will be authorized once you are discharged, as it is imperative that you return to your primary care physician (or establish a relationship with a primary care physician if you do not have one) for your aftercare needs so that they can reassess your need for medications and monitor your lab values.       Time coordinating discharge: Greater than 30 minutes  Signed:  Jaquel Coomer  [facility] 08/25/2021, 10:49 AM   Pager on [email or web address]. If 7PM-7AM, please contact night-coverage at [email or web address]

## 2021-08-25 NOTE — Progress Notes (Signed)
[facility])                                            [place] Team    08/25/2021  Angela Patterson October 14, 1920 [id number]  Care coordination call placed to [facility] in regard to [facility] application.  Spoke to [place] who informs patient is APPROVED 08/09/21-07/19/22. She informs patient is to call BI as she needs her medication refilled just as she did in 2022  She informs to allow 14 days for BI to process and deliver to her home.  Rockey Guarino P. [place], [place]  [phone]

## 2021-08-25 NOTE — Plan of Care (Signed)
°  Problem: Activity: Goal: Risk for activity intolerance will decrease Outcome: Progressing   Problem: Coping: Goal: Level of anxiety will decrease Outcome: Progressing   Problem: Elimination: Goal: Will not experience complications related to bowel motility Outcome: Progressing Goal: Will not experience complications related to urinary retention Outcome: Progressing   Problem: Pain Managment: Goal: General experience of comfort will improve Outcome: Progressing   Problem: Skin Integrity: Goal: Risk for impaired skin integrity will decrease Outcome: Progressing

## 2021-08-26 DIAGNOSIS — I10 Essential (primary) hypertension: Secondary | ICD-10-CM | POA: Diagnosis not present

## 2021-08-26 DIAGNOSIS — D696 Thrombocytopenia, unspecified: Secondary | ICD-10-CM | POA: Diagnosis not present

## 2021-08-26 DIAGNOSIS — S81811D Laceration without foreign body, right lower leg, subsequent encounter: Secondary | ICD-10-CM | POA: Diagnosis not present

## 2021-08-26 DIAGNOSIS — I480 Paroxysmal atrial fibrillation: Secondary | ICD-10-CM | POA: Diagnosis not present

## 2021-08-26 DIAGNOSIS — R296 Repeated falls: Secondary | ICD-10-CM | POA: Diagnosis not present

## 2021-08-26 DIAGNOSIS — L97319 Non-pressure chronic ulcer of right ankle with unspecified severity: Secondary | ICD-10-CM | POA: Diagnosis not present

## 2021-08-26 DIAGNOSIS — S0093XD Contusion of unspecified part of head, subsequent encounter: Secondary | ICD-10-CM | POA: Diagnosis not present

## 2021-08-26 DIAGNOSIS — E1140 Type 2 diabetes mellitus with diabetic neuropathy, unspecified: Secondary | ICD-10-CM | POA: Diagnosis not present

## 2021-08-26 DIAGNOSIS — H409 Unspecified glaucoma: Secondary | ICD-10-CM | POA: Diagnosis not present

## 2021-08-26 DIAGNOSIS — H9193 Unspecified hearing loss, bilateral: Secondary | ICD-10-CM | POA: Diagnosis not present

## 2021-08-26 DIAGNOSIS — N329 Bladder disorder, unspecified: Secondary | ICD-10-CM | POA: Diagnosis not present

## 2021-08-26 DIAGNOSIS — M6281 Muscle weakness (generalized): Secondary | ICD-10-CM | POA: Diagnosis not present

## 2021-08-27 DIAGNOSIS — I70233 Atherosclerosis of native arteries of right leg with ulceration of ankle: Secondary | ICD-10-CM | POA: Diagnosis not present

## 2021-08-28 DIAGNOSIS — S81801D Unspecified open wound, right lower leg, subsequent encounter: Secondary | ICD-10-CM | POA: Diagnosis not present

## 2021-08-28 DIAGNOSIS — K219 Gastro-esophageal reflux disease without esophagitis: Secondary | ICD-10-CM | POA: Diagnosis not present

## 2021-08-28 DIAGNOSIS — R319 Hematuria, unspecified: Secondary | ICD-10-CM | POA: Diagnosis not present

## 2021-08-29 ENCOUNTER — Other Ambulatory Visit: Payer: Self-pay

## 2021-08-29 ENCOUNTER — Non-Acute Institutional Stay: Payer: Medicare Other | Admitting: Primary Care

## 2021-08-29 DIAGNOSIS — S81801A Unspecified open wound, right lower leg, initial encounter: Secondary | ICD-10-CM

## 2021-08-29 DIAGNOSIS — Z515 Encounter for palliative care: Secondary | ICD-10-CM | POA: Diagnosis not present

## 2021-08-29 DIAGNOSIS — E440 Moderate protein-calorie malnutrition: Secondary | ICD-10-CM | POA: Diagnosis not present

## 2021-08-29 DIAGNOSIS — R531 Weakness: Secondary | ICD-10-CM | POA: Diagnosis not present

## 2021-08-29 NOTE — Progress Notes (Signed)
[facility] Consult Note Telephone: [phone]  Fax: [phone]    Date of encounter: 08/29/21 2:10 PM PATIENT NAME: Angela Patterson [place] [id number]   [phone] (home)  DOB: [birth year] MRN: [id number] PRIMARY CARE PROVIDER:    Einar Pheasant, MD,  [address] [place] [address] [id number]  REFERRING PROVIDER:   Rica Koyanagi MD  RESPONSIBLE PARTY:    Contact Information     Name Relation Home Work Mobile   Angela Patterson Daughter [phone]        I met face to face with patient and family in  [facility] facility. Palliative Care was asked to follow this patient by consultation request of  Rica Koyanagi MD to address advance care planning and complex medical decision making. This is a follow up visit.                                   ASSESSMENT AND PLAN / RECOMMENDATIONS:   Advance Care Planning/Goals of Care: Goals include to maximize quality of life and symptom management. Patient/health care surrogate gave his/her permission to discuss.Our advance care planning conversation included a discussion about:    Exploration of personal, cultural or spiritual beliefs that might influence medical decisions  Exploration of goals of care in the event of a sudden injury or illness  CODE STATUS: DNR  Symptom Management/Plan:  I met with Angela Patterson in her skilled facility room. Her family and Caregiver were present. Patient was here for rehab following a fall. She has multiple face bruising a  left forehead laceration and a right lower leg laceration with sutures. Daughter with patient states she may not be coming back home they are considering whether to place in the facility. Patient is alert and interactive. She is able to tell me her name and converse.  Discussed case with SNF NP as well.  Recent A1C = 10 % albumin 4.2. Daughter endorses she is  making headway with her exercises and I will follow for palliative care needs.   Follow up Palliative Care Visit: Palliative care will continue to follow for complex medical decision making, advance care planning, and clarification of goals. Return 4 weeks or prn.  I spent 25 minutes providing this consultation. More than 50% of the time in this consultation was spent in counseling and care coordination.  PPS: 30%  HOSPICE ELIGIBILITY/DIAGNOSIS: TBD  Chief Complaint: debility, decline  HISTORY OF PRESENT ILLNESS:  Angela Patterson is a 88 y.o. year old female  with Angela Patterson is a 88 y.o. female with medical history significant for stroke, dementia, diabetes mellitus, hypertension, glucoma, hearing impairment, chronic right foot ulcer, frequent falls, chronic thrombocytopenia, who presented to the hospital after a mechanical fall at home.  She did not lose consciousness.  She sustained a laceration to the left forehead and the anterior aspect of the right leg.  Lacerations on the face and right leg were sutured in the emergency room.   Hospital course was complicated by delirium/agitation. She also had paroxysmal atrial fibrillation.  Her family declined anticoagulation for stroke prophylaxis because of advanced age and increased risk of bleeding.  She was evaluated by PT and OT who recommended further rehabilitation at a skilled nursing facility.  She is at [facility] for rehab. Daughter is deciding about permanent placement due to decline.  History obtained from  review of EMR, discussion with primary team, and interview with family, facility staff/caregiver and/or Angela Patterson.  I reviewed available labs, medications, imaging, studies and related documents from the EMR.  Records reviewed and summarized above.   ROS  General: NAD ENMT: denies dysphagia Pulmonary: denies cough, denies increased SOB Abdomen: endorses good appetite, denies constipation, endorses continence of bowel GU: denies  dysuria, endorses continence of urine MSK:  endorses  increased weakness,  no falls reported Skin: denies rashes or wounds Neurological: denies pain, denies insomnia Psych: Endorses positive mood Heme/lymph/immuno: denies bruises, abnormal bleeding  Physical Exam: Current and past weights: 102 lbs Constitutional: NAD General: frail appearing, thin EYES: anicteric sclera, lids intact, no discharge  ENMT: hard of  hearing, oral mucous membranes moist, dentition intact CV: no LE edema Pulmonary:no increased work of breathing, no cough, room air Abdomen: intake 50%, no ascites GU: deferred MSK: + sarcopenia, moves all extremities Skin: warm and dry, no rashes or wounds on visible skin Neuro:  +generalized weakness,  + cognitive impairment Psych: non-anxious affect, A and O x 1 Hem/lymph/immuno: no widespread bruising   Thank you for the opportunity to participate in the care of Angela Patterson.  The palliative care team will continue to follow. Please call our office at [phone] if we can be of additional assistance.   Jason Coop, NP DNP, AGPCNP-BC  COVID-19 PATIENT SCREENING TOOL Asked and negative response unless otherwise noted:   Have you had symptoms of covid, tested positive or been in contact with someone with symptoms/positive test in the past 5-10 days?

## 2021-09-01 DIAGNOSIS — R319 Hematuria, unspecified: Secondary | ICD-10-CM | POA: Diagnosis not present

## 2021-09-01 DIAGNOSIS — I480 Paroxysmal atrial fibrillation: Secondary | ICD-10-CM | POA: Diagnosis not present

## 2021-09-01 DIAGNOSIS — U071 COVID-19: Secondary | ICD-10-CM | POA: Diagnosis not present

## 2021-09-01 DIAGNOSIS — E1140 Type 2 diabetes mellitus with diabetic neuropathy, unspecified: Secondary | ICD-10-CM | POA: Diagnosis not present

## 2021-09-02 ENCOUNTER — Other Ambulatory Visit: Payer: Medicare Other

## 2021-09-04 DIAGNOSIS — U071 COVID-19: Secondary | ICD-10-CM | POA: Diagnosis not present

## 2021-09-04 DIAGNOSIS — M542 Cervicalgia: Secondary | ICD-10-CM | POA: Diagnosis not present

## 2021-09-08 DIAGNOSIS — I480 Paroxysmal atrial fibrillation: Secondary | ICD-10-CM | POA: Diagnosis not present

## 2021-09-08 DIAGNOSIS — U071 COVID-19: Secondary | ICD-10-CM | POA: Diagnosis not present

## 2021-09-10 DIAGNOSIS — M6281 Muscle weakness (generalized): Secondary | ICD-10-CM | POA: Diagnosis not present

## 2021-09-10 DIAGNOSIS — R296 Repeated falls: Secondary | ICD-10-CM | POA: Diagnosis not present

## 2021-09-12 DIAGNOSIS — U071 COVID-19: Secondary | ICD-10-CM | POA: Diagnosis not present

## 2021-09-12 DIAGNOSIS — I480 Paroxysmal atrial fibrillation: Secondary | ICD-10-CM | POA: Diagnosis not present

## 2021-09-15 DIAGNOSIS — I480 Paroxysmal atrial fibrillation: Secondary | ICD-10-CM | POA: Diagnosis not present

## 2021-09-15 DIAGNOSIS — E1140 Type 2 diabetes mellitus with diabetic neuropathy, unspecified: Secondary | ICD-10-CM | POA: Diagnosis not present

## 2021-09-15 DIAGNOSIS — S81801D Unspecified open wound, right lower leg, subsequent encounter: Secondary | ICD-10-CM | POA: Diagnosis not present

## 2021-09-16 ENCOUNTER — Ambulatory Visit: Payer: Medicare Other | Admitting: Podiatry

## 2021-09-17 DIAGNOSIS — I70233 Atherosclerosis of native arteries of right leg with ulceration of ankle: Secondary | ICD-10-CM | POA: Diagnosis not present

## 2021-09-18 DIAGNOSIS — S81801D Unspecified open wound, right lower leg, subsequent encounter: Secondary | ICD-10-CM | POA: Diagnosis not present

## 2021-09-18 DIAGNOSIS — I480 Paroxysmal atrial fibrillation: Secondary | ICD-10-CM | POA: Diagnosis not present

## 2021-09-18 DIAGNOSIS — E1140 Type 2 diabetes mellitus with diabetic neuropathy, unspecified: Secondary | ICD-10-CM | POA: Diagnosis not present

## 2021-09-18 DIAGNOSIS — M6281 Muscle weakness (generalized): Secondary | ICD-10-CM | POA: Diagnosis not present

## 2021-09-19 ENCOUNTER — Non-Acute Institutional Stay: Payer: Medicare Other | Admitting: Primary Care

## 2021-09-19 ENCOUNTER — Other Ambulatory Visit: Payer: Self-pay

## 2021-09-20 DIAGNOSIS — S81802D Unspecified open wound, left lower leg, subsequent encounter: Secondary | ICD-10-CM | POA: Diagnosis not present

## 2021-09-20 DIAGNOSIS — I480 Paroxysmal atrial fibrillation: Secondary | ICD-10-CM | POA: Diagnosis not present

## 2021-09-20 DIAGNOSIS — I119 Hypertensive heart disease without heart failure: Secondary | ICD-10-CM | POA: Diagnosis not present

## 2021-09-20 DIAGNOSIS — Z9181 History of falling: Secondary | ICD-10-CM | POA: Diagnosis not present

## 2021-09-20 DIAGNOSIS — M6259 Muscle wasting and atrophy, not elsewhere classified, multiple sites: Secondary | ICD-10-CM | POA: Diagnosis not present

## 2021-09-20 DIAGNOSIS — E1165 Type 2 diabetes mellitus with hyperglycemia: Secondary | ICD-10-CM | POA: Diagnosis not present

## 2021-09-20 DIAGNOSIS — H409 Unspecified glaucoma: Secondary | ICD-10-CM | POA: Diagnosis not present

## 2021-09-20 DIAGNOSIS — U071 COVID-19: Secondary | ICD-10-CM | POA: Diagnosis not present

## 2021-09-20 DIAGNOSIS — F330 Major depressive disorder, recurrent, mild: Secondary | ICD-10-CM | POA: Diagnosis not present

## 2021-09-20 DIAGNOSIS — S90521D Blister (nonthermal), right ankle, subsequent encounter: Secondary | ICD-10-CM | POA: Diagnosis not present

## 2021-09-20 DIAGNOSIS — K219 Gastro-esophageal reflux disease without esophagitis: Secondary | ICD-10-CM | POA: Diagnosis not present

## 2021-09-20 DIAGNOSIS — Z7902 Long term (current) use of antithrombotics/antiplatelets: Secondary | ICD-10-CM | POA: Diagnosis not present

## 2021-09-20 DIAGNOSIS — Z7984 Long term (current) use of oral hypoglycemic drugs: Secondary | ICD-10-CM | POA: Diagnosis not present

## 2021-09-20 DIAGNOSIS — Z8673 Personal history of transient ischemic attack (TIA), and cerebral infarction without residual deficits: Secondary | ICD-10-CM | POA: Diagnosis not present

## 2021-09-22 ENCOUNTER — Telehealth: Payer: Self-pay | Admitting: Internal Medicine

## 2021-09-22 DIAGNOSIS — U071 COVID-19: Secondary | ICD-10-CM | POA: Diagnosis not present

## 2021-09-22 DIAGNOSIS — I480 Paroxysmal atrial fibrillation: Secondary | ICD-10-CM | POA: Diagnosis not present

## 2021-09-22 DIAGNOSIS — E1165 Type 2 diabetes mellitus with hyperglycemia: Secondary | ICD-10-CM | POA: Diagnosis not present

## 2021-09-22 DIAGNOSIS — S81802D Unspecified open wound, left lower leg, subsequent encounter: Secondary | ICD-10-CM | POA: Diagnosis not present

## 2021-09-22 DIAGNOSIS — I119 Hypertensive heart disease without heart failure: Secondary | ICD-10-CM | POA: Diagnosis not present

## 2021-09-22 DIAGNOSIS — S90521D Blister (nonthermal), right ankle, subsequent encounter: Secondary | ICD-10-CM | POA: Diagnosis not present

## 2021-09-22 NOTE — Telephone Encounter (Signed)
Angela Patterson From [facility] called in stating that she have sent of a plan of care order sent over to our office. Angela Patterson was calling to confirm if we received the orders. Angela Patterson requesting verbal orders for wound care.  ? ?Verbal Orders: Wound Care: 1W1, [id number], 2W1, 1W6 with 2 Prn.  ? ?Verbal Orders : PT and OT ? ?Angela Patterson stated if there is any questions or concern please feel free to contact her at [phone] or fax number [phone] ?

## 2021-09-23 NOTE — Telephone Encounter (Signed)
Signed and placed in box.   

## 2021-09-23 NOTE — Telephone Encounter (Signed)
Orders placed in quick sign ?

## 2021-09-23 NOTE — Telephone Encounter (Signed)
faxed

## 2021-09-24 DIAGNOSIS — I119 Hypertensive heart disease without heart failure: Secondary | ICD-10-CM | POA: Diagnosis not present

## 2021-09-24 DIAGNOSIS — I480 Paroxysmal atrial fibrillation: Secondary | ICD-10-CM | POA: Diagnosis not present

## 2021-09-24 DIAGNOSIS — S90521D Blister (nonthermal), right ankle, subsequent encounter: Secondary | ICD-10-CM | POA: Diagnosis not present

## 2021-09-24 DIAGNOSIS — E1165 Type 2 diabetes mellitus with hyperglycemia: Secondary | ICD-10-CM | POA: Diagnosis not present

## 2021-09-24 DIAGNOSIS — S81802D Unspecified open wound, left lower leg, subsequent encounter: Secondary | ICD-10-CM | POA: Diagnosis not present

## 2021-09-24 DIAGNOSIS — U071 COVID-19: Secondary | ICD-10-CM | POA: Diagnosis not present

## 2021-09-24 NOTE — Telephone Encounter (Signed)
Verbals given. Written orders will be faxed for Dr Nicki Reaper to sign. ?

## 2021-09-24 NOTE — Telephone Encounter (Signed)
Angela Patterson From [facility] called in stating that she have sent over a plan of care order to our office for after evaluation.  Angela Patterson was calling to confirm if we received the after evaluation orders. [name] requesting verbal orders for PT and OT.  ? ?Verbal Orders for OT: 1W6 ?Verbal Orders for PT: 1W1, 2W3, 1W4 ? ?Angela Patterson stated if there is any questions or concern please feel free to contact her at [phone] or fax number [phone] ?

## 2021-09-25 ENCOUNTER — Ambulatory Visit: Payer: Self-pay | Admitting: Pharmacist

## 2021-09-25 DIAGNOSIS — S90521D Blister (nonthermal), right ankle, subsequent encounter: Secondary | ICD-10-CM | POA: Diagnosis not present

## 2021-09-25 DIAGNOSIS — R269 Unspecified abnormalities of gait and mobility: Secondary | ICD-10-CM | POA: Diagnosis not present

## 2021-09-25 DIAGNOSIS — S51802A Unspecified open wound of left forearm, initial encounter: Secondary | ICD-10-CM | POA: Diagnosis not present

## 2021-09-25 DIAGNOSIS — I119 Hypertensive heart disease without heart failure: Secondary | ICD-10-CM | POA: Diagnosis not present

## 2021-09-25 DIAGNOSIS — U071 COVID-19: Secondary | ICD-10-CM | POA: Diagnosis not present

## 2021-09-25 DIAGNOSIS — S81802D Unspecified open wound, left lower leg, subsequent encounter: Secondary | ICD-10-CM | POA: Diagnosis not present

## 2021-09-25 DIAGNOSIS — H9113 Presbycusis, bilateral: Secondary | ICD-10-CM | POA: Diagnosis not present

## 2021-09-25 DIAGNOSIS — E1165 Type 2 diabetes mellitus with hyperglycemia: Secondary | ICD-10-CM | POA: Diagnosis not present

## 2021-09-25 DIAGNOSIS — Z66 Do not resuscitate: Secondary | ICD-10-CM | POA: Diagnosis not present

## 2021-09-25 DIAGNOSIS — M1991 Primary osteoarthritis, unspecified site: Secondary | ICD-10-CM | POA: Diagnosis not present

## 2021-09-25 DIAGNOSIS — I480 Paroxysmal atrial fibrillation: Secondary | ICD-10-CM | POA: Diagnosis not present

## 2021-09-25 DIAGNOSIS — E119 Type 2 diabetes mellitus without complications: Secondary | ICD-10-CM | POA: Diagnosis not present

## 2021-09-25 DIAGNOSIS — K219 Gastro-esophageal reflux disease without esophagitis: Secondary | ICD-10-CM | POA: Diagnosis not present

## 2021-09-25 DIAGNOSIS — S91001D Unspecified open wound, right ankle, subsequent encounter: Secondary | ICD-10-CM | POA: Diagnosis not present

## 2021-09-25 NOTE — Patient Instructions (Addendum)
Hi Angela Patterson (and [place]),  ? ?I am being asked to quickly transition into another role within the health system, so unfortunately I am unable to keep our next appointment. Please continue to follow up with your primary care provider as scheduled.  ? ?As a reminder - For prescription refills through the [facility] Patient Assistance Program, call [phone]. Once you have been enrolled in the program, your prescriptions can easily be refilled by contacting the phone number above Monday though Friday 8:30 AM - 6:00 PM.  ? ? ?It has been a pleasure working with you! ? ?Catie Darnelle Maffucci, PharmD ? ?

## 2021-09-25 NOTE — Chronic Care Management (AMB) (Signed)
?  Chronic Care Management  ? ?Note ? ?09/25/2021 ?Name: Angela Patterson MRN: [id number] DOB: 1921-07-14 ? ? ? ?Closing pharmacy CCM case at this time.  Patient has clinic contact information for future questions or concerns.  ? ?Catie Darnelle Maffucci, PharmD, [place], CPP ?Clinical Pharmacist ?Therapist, music at [employer] ?[phone] ? ?

## 2021-09-26 NOTE — Telephone Encounter (Signed)
Faxed

## 2021-09-26 NOTE — Telephone Encounter (Signed)
Angela Patterson called in to confirm if Dr. Nicki Reaper received after evaluation orders through fax.  ?

## 2021-09-29 DIAGNOSIS — E1165 Type 2 diabetes mellitus with hyperglycemia: Secondary | ICD-10-CM | POA: Diagnosis not present

## 2021-09-29 DIAGNOSIS — U071 COVID-19: Secondary | ICD-10-CM | POA: Diagnosis not present

## 2021-09-29 DIAGNOSIS — S81802D Unspecified open wound, left lower leg, subsequent encounter: Secondary | ICD-10-CM | POA: Diagnosis not present

## 2021-09-29 DIAGNOSIS — S90521D Blister (nonthermal), right ankle, subsequent encounter: Secondary | ICD-10-CM | POA: Diagnosis not present

## 2021-09-29 DIAGNOSIS — I119 Hypertensive heart disease without heart failure: Secondary | ICD-10-CM | POA: Diagnosis not present

## 2021-09-29 DIAGNOSIS — I480 Paroxysmal atrial fibrillation: Secondary | ICD-10-CM | POA: Diagnosis not present

## 2021-09-30 DIAGNOSIS — I119 Hypertensive heart disease without heart failure: Secondary | ICD-10-CM | POA: Diagnosis not present

## 2021-09-30 DIAGNOSIS — S81802D Unspecified open wound, left lower leg, subsequent encounter: Secondary | ICD-10-CM | POA: Diagnosis not present

## 2021-09-30 DIAGNOSIS — S90521D Blister (nonthermal), right ankle, subsequent encounter: Secondary | ICD-10-CM | POA: Diagnosis not present

## 2021-09-30 DIAGNOSIS — I480 Paroxysmal atrial fibrillation: Secondary | ICD-10-CM | POA: Diagnosis not present

## 2021-09-30 DIAGNOSIS — U071 COVID-19: Secondary | ICD-10-CM | POA: Diagnosis not present

## 2021-09-30 DIAGNOSIS — E1165 Type 2 diabetes mellitus with hyperglycemia: Secondary | ICD-10-CM | POA: Diagnosis not present

## 2021-10-02 DIAGNOSIS — I480 Paroxysmal atrial fibrillation: Secondary | ICD-10-CM | POA: Diagnosis not present

## 2021-10-02 DIAGNOSIS — U071 COVID-19: Secondary | ICD-10-CM | POA: Diagnosis not present

## 2021-10-02 DIAGNOSIS — I119 Hypertensive heart disease without heart failure: Secondary | ICD-10-CM | POA: Diagnosis not present

## 2021-10-02 DIAGNOSIS — S81802D Unspecified open wound, left lower leg, subsequent encounter: Secondary | ICD-10-CM | POA: Diagnosis not present

## 2021-10-02 DIAGNOSIS — S90521D Blister (nonthermal), right ankle, subsequent encounter: Secondary | ICD-10-CM | POA: Diagnosis not present

## 2021-10-02 DIAGNOSIS — E1165 Type 2 diabetes mellitus with hyperglycemia: Secondary | ICD-10-CM | POA: Diagnosis not present

## 2021-10-06 ENCOUNTER — Other Ambulatory Visit: Payer: Self-pay

## 2021-10-06 ENCOUNTER — Non-Acute Institutional Stay: Payer: Medicare Other | Admitting: Student

## 2021-10-06 ENCOUNTER — Telehealth: Payer: Self-pay | Admitting: Student

## 2021-10-06 DIAGNOSIS — S90521D Blister (nonthermal), right ankle, subsequent encounter: Secondary | ICD-10-CM | POA: Diagnosis not present

## 2021-10-06 DIAGNOSIS — U071 COVID-19: Secondary | ICD-10-CM | POA: Diagnosis not present

## 2021-10-06 DIAGNOSIS — R531 Weakness: Secondary | ICD-10-CM

## 2021-10-06 DIAGNOSIS — S91001D Unspecified open wound, right ankle, subsequent encounter: Secondary | ICD-10-CM

## 2021-10-06 DIAGNOSIS — S81802D Unspecified open wound, left lower leg, subsequent encounter: Secondary | ICD-10-CM | POA: Diagnosis not present

## 2021-10-06 DIAGNOSIS — Z515 Encounter for palliative care: Secondary | ICD-10-CM

## 2021-10-06 DIAGNOSIS — E1165 Type 2 diabetes mellitus with hyperglycemia: Secondary | ICD-10-CM | POA: Diagnosis not present

## 2021-10-06 DIAGNOSIS — I480 Paroxysmal atrial fibrillation: Secondary | ICD-10-CM | POA: Diagnosis not present

## 2021-10-06 DIAGNOSIS — R630 Anorexia: Secondary | ICD-10-CM

## 2021-10-06 DIAGNOSIS — I119 Hypertensive heart disease without heart failure: Secondary | ICD-10-CM | POA: Diagnosis not present

## 2021-10-06 NOTE — Telephone Encounter (Signed)
Palliative NP spoke with patient's daughter to provide her an update on visit today. She denies any needs or concerns at this time.  ?

## 2021-10-06 NOTE — Progress Notes (Signed)
? ? ?Manufacturing engineer ?Community Palliative Care Consult Note ?Telephone: [phone]  ?Fax: [phone]  ? ?Date of encounter: 10/06/21 ?11:18 AM ?PATIENT NAME: Angela Patterson ?[address] ?[place] [address]   ?[phone] (home)  ?DOB: 10/07/1920 ?MRN: [id number] ?PRIMARY CARE PROVIDER:    ?Angela Pheasant, MD,  ?[address] ?[place] [address] ?[phone] ? ?REFERRING PROVIDER:   ?Angela Pheasant, MD ?[address] ?[address] ?[place],  [place] [address] ?[phone] ? ?RESPONSIBLE PARTY:    ?Contact Information   ? ? Name Relation Home Work Mobile  ? Angela Patterson Daughter [phone]  ? ?  ? ? ? ?I met face to face with patient in the facility. Palliative Care was asked to follow this patient by consultation request of   to address advance care planning and complex medical decision making. This is the initial visit.  ? ? ?                                 ASSESSMENT AND PLAN / RECOMMENDATIONS:  ? ?Advance Care Planning/Goals of Care: Goals include to maximize quality of life and symptom management. Patient/health care surrogate gave his/her permission to discuss.Our advance care planning conversation included a discussion about:    ?The value and importance of advance care planning  ?Experiences with loved ones who have been seriously ill or have died  ?Exploration of personal, cultural or spiritual beliefs that might influence medical decisions  ?Exploration of goals of care in the event of a sudden injury or illness  ?Review MOST form; code status ?CODE STATUS: DNR ? ?Symptom Management/Plan: ? ?Wound to right lateral ankle-area is scabbed over, left open to air. Continue HH SN for wound care as ordered. Monitor for worsening, signs of infection.  ? ?Generalized weakness-uses walker for ambulation; participate in therapy as directed.  ? ?Appetite-patient endorses a fair appetite; no access to current weight. Last weight on  file is 102 pounds; asked staff to obtain weight. Will monitor for weight loss.  ? ?Follow up Palliative Care Visit: Palliative care will continue to follow for complex medical decision making, advance care planning, and clarification of goals. Return in 6-8 weeks or prn. ? ? ?This visit was coded based on medical decision making (MDM). ? ?PPS: 40% ? ?HOSPICE ELIGIBILITY/DIAGNOSIS: TBD ? ?Chief Complaint: Palliative Medicine follow up visit.  ? ?HISTORY OF PRESENT ILLNESS:  Angela Patterson is a 88 y.o. year old female  with CVA, dementia, T2DM, thrombocytopenia, paroxysmal atrial fibrillation. Patient hospitalized 2/2-08/25/21 due to mechanical fall. Patient transferred from [facility]. Currently resides at [facility].   ? ?Patient received resting in chair; she arouses to stimulation. She is able to answer direct questions. She denies any needs. No pain, shortness of breath, constipation. States she is sleeping well at night. Uses walker for ambulation. Endorses fair appetite. Staff report patient does require assistance with adl's, toileting. She does ambulate ad lib about her room; no falls reported. A 10-point ROS is negative, except for the pertinent positives and negatives detailed per the HPI.  ? ?History obtained from review of EMR, discussion with primary team, and interview with family, facility staff/caregiver and/or [name].  ?I reviewed available labs, medications, imaging, studies and related documents from the EMR.  Records reviewed and summarized above.  ? ? ?Physical Exam: ?Pulse 92, resp 16, b/p 120/80, sats 95% on room air ?Constitutional: NAD ?General: frail  appearing, thin ?EYES: anicteric sclera, lids intact, no discharge  ?ENMT: hard of hearing, oral mucous membranes moist ?CV: S1S2, Irregular rate, rhythm, trace pedal LE edema ?Pulmonary: LCTA, no increased work of breathing, no cough, room air ?Abdomen: normo-active BS + 4 quadrants, soft and non tender, no ascites ?GU: deferred ?MSK:  sarcopenia, moves all extremities, ambulatory ?Skin: warm and dry, no rashes, healed wound to RLE, LOTA. Right lateral ankle wound, scabbed over  left open to air ?Neuro:  generalized weakness,  A & O x 2, forgetful ?Psych: non-anxious affect, pleasant, cooperative  ?Hem/lymph/immuno: no widespread bruising ?CURRENT PROBLEM LIST:  ?Patient Active Problem List  ? Diagnosis Date Noted  ? Malnutrition of moderate degree 08/22/2021  ? Glaucoma 08/21/2021  ? Laceration of eyebrow and forehead, initial encounter 08/21/2021  ? Laceration of right lower leg 08/21/2021  ? Paroxysmal atrial fibrillation ([place]) 08/21/2021  ? Swallowing difficulty 07/06/2021  ? Wound of right leg 06/13/2021  ? Abnormal urine odor 05/20/2021  ? Weakness 10/27/2020  ? Lobar pneumonia ([place])   ? Lactic acidosis   ? Acute metabolic encephalopathy   ? CAP (community acquired pneumonia) 10/18/2020  ? Cough 09/15/2020  ? Finger lesion 06/13/2020  ? Nasal lesion 06/13/2020  ? Hearing impairment 12/18/2019  ? Finger laceration 02/21/2019  ? Gait abnormality 11/13/2018  ? Fall at home, initial encounter 08/28/2018  ? Anxiety 06/22/2018  ? Diarrhea 12/13/2017  ? Rectal bleeding 07/07/2017  ? Ulcer of right foot with fat layer exposed ([place]) 04/18/2017  ? Open wound of foot excluding toes without complication 09/32/6712  ? Health care maintenance 10/27/2014  ? Weight loss 12/27/2013  ? Thrombocytopenia ([place]) 11/20/2012  ? Hypercholesterolemia 07/21/2012  ? History of CVA (cerebrovascular accident) 07/21/2012  ? Essential hypertension 07/21/2012  ? Pure hypercholesterolemia 07/21/2012  ? Type 2 diabetes mellitus ([place]) 07/21/2012  ? ?PAST MEDICAL HISTORY:  ?Active Ambulatory Problems  ?  Diagnosis Date Noted  ? Hypercholesterolemia 07/21/2012  ? History of CVA (cerebrovascular accident) 07/21/2012  ? Thrombocytopenia ([place]) 11/20/2012  ? Weight loss 12/27/2013  ? Health care maintenance 10/27/2014  ? Open wound of foot excluding toes without complication  45/80/9983  ? Essential hypertension 07/21/2012  ? Pure hypercholesterolemia 07/21/2012  ? Type 2 diabetes mellitus ([place]) 07/21/2012  ? Ulcer of right foot with fat layer exposed ([place]) 04/18/2017  ? Rectal bleeding 07/07/2017  ? Diarrhea 12/13/2017  ? Anxiety 06/22/2018  ? Fall at home, initial encounter 08/28/2018  ? Gait abnormality 11/13/2018  ? Finger laceration 02/21/2019  ? Hearing impairment 12/18/2019  ? Finger lesion 06/13/2020  ? Nasal lesion 06/13/2020  ? Cough 09/15/2020  ? CAP (community acquired pneumonia) 10/18/2020  ? Lobar pneumonia ([place])   ? Lactic acidosis   ? Acute metabolic encephalopathy   ? Weakness 10/27/2020  ? Abnormal urine odor 05/20/2021  ? Wound of right leg 06/13/2021  ? Swallowing difficulty 07/06/2021  ? Glaucoma 08/21/2021  ? Laceration of eyebrow and forehead, initial encounter 08/21/2021  ? Laceration of right lower leg 08/21/2021  ? Paroxysmal atrial fibrillation ([place]) 08/21/2021  ? Malnutrition of moderate degree 08/22/2021  ? ?Resolved Ambulatory Problems  ?  Diagnosis Date Noted  ? Cerebral artery occlusion with cerebral infarction ([place]) 07/21/2012  ? URI (upper respiratory infection) 04/18/2017  ? ?Past Medical History:  ?Diagnosis Date  ? Allergy   ? Chicken pox   ? CVA (cerebral vascular accident) [facility])   ? Diabetes mellitus ([place])   ? Hypertension   ? ?SOCIAL HX:  ?  Social History  ? ?Tobacco Use  ? Smoking status: Never  ? Smokeless tobacco: Never  ?Substance Use Topics  ? Alcohol use: No  ?  Alcohol/week: 0.0 standard drinks  ? ?FAMILY HX:  ?Family History  ?Problem Relation Age of Onset  ? Liver cancer Father   ? Stroke Mother   ? Heart attack Brother   ? Hypertension Daughter   ? Hypertension Son   ? Hypertension Daughter   ? Cancer Grandchild   ?     breast  ? Diabetes Grandchild   ? Breast cancer Neg Hx   ? Colon cancer Neg Hx   ?   ? ?ALLERGIES:  ?Allergies  ?Allergen Reactions  ? Actos [Pioglitazone] Swelling  ? Contrast Media [Iodinated Contrast Media] Swelling  ?  Prandin [Repaglinide] Swelling  ? Pravastatin Sodium   ?  cramps  ?   ?PERTINENT MEDICATIONS:  ?Outpatient Encounter Medications as of 10/06/2021  ?Medication Sig  ? amLODipine (NORVASC) 5 MG tablet TAKE 1 TA

## 2021-10-07 DIAGNOSIS — I480 Paroxysmal atrial fibrillation: Secondary | ICD-10-CM | POA: Diagnosis not present

## 2021-10-07 DIAGNOSIS — I119 Hypertensive heart disease without heart failure: Secondary | ICD-10-CM | POA: Diagnosis not present

## 2021-10-07 DIAGNOSIS — E1165 Type 2 diabetes mellitus with hyperglycemia: Secondary | ICD-10-CM | POA: Diagnosis not present

## 2021-10-07 DIAGNOSIS — S90521D Blister (nonthermal), right ankle, subsequent encounter: Secondary | ICD-10-CM | POA: Diagnosis not present

## 2021-10-07 DIAGNOSIS — U071 COVID-19: Secondary | ICD-10-CM | POA: Diagnosis not present

## 2021-10-07 DIAGNOSIS — S81802D Unspecified open wound, left lower leg, subsequent encounter: Secondary | ICD-10-CM | POA: Diagnosis not present

## 2021-10-08 ENCOUNTER — Other Ambulatory Visit: Payer: Self-pay

## 2021-10-08 ENCOUNTER — Other Ambulatory Visit: Payer: Medicare Other | Admitting: Nurse Practitioner

## 2021-10-08 DIAGNOSIS — S81802D Unspecified open wound, left lower leg, subsequent encounter: Secondary | ICD-10-CM | POA: Diagnosis not present

## 2021-10-08 DIAGNOSIS — U071 COVID-19: Secondary | ICD-10-CM | POA: Diagnosis not present

## 2021-10-08 DIAGNOSIS — I480 Paroxysmal atrial fibrillation: Secondary | ICD-10-CM | POA: Diagnosis not present

## 2021-10-08 DIAGNOSIS — I119 Hypertensive heart disease without heart failure: Secondary | ICD-10-CM | POA: Diagnosis not present

## 2021-10-08 DIAGNOSIS — S90521D Blister (nonthermal), right ankle, subsequent encounter: Secondary | ICD-10-CM | POA: Diagnosis not present

## 2021-10-08 DIAGNOSIS — E1165 Type 2 diabetes mellitus with hyperglycemia: Secondary | ICD-10-CM | POA: Diagnosis not present

## 2021-10-09 DIAGNOSIS — I480 Paroxysmal atrial fibrillation: Secondary | ICD-10-CM | POA: Diagnosis not present

## 2021-10-09 DIAGNOSIS — H9113 Presbycusis, bilateral: Secondary | ICD-10-CM | POA: Diagnosis not present

## 2021-10-09 DIAGNOSIS — S91001A Unspecified open wound, right ankle, initial encounter: Secondary | ICD-10-CM | POA: Diagnosis not present

## 2021-10-09 DIAGNOSIS — E11628 Type 2 diabetes mellitus with other skin complications: Secondary | ICD-10-CM | POA: Diagnosis not present

## 2021-10-09 DIAGNOSIS — I119 Hypertensive heart disease without heart failure: Secondary | ICD-10-CM | POA: Diagnosis not present

## 2021-10-09 DIAGNOSIS — L97312 Non-pressure chronic ulcer of right ankle with fat layer exposed: Secondary | ICD-10-CM | POA: Diagnosis not present

## 2021-10-09 DIAGNOSIS — B351 Tinea unguium: Secondary | ICD-10-CM | POA: Diagnosis not present

## 2021-10-09 DIAGNOSIS — E1159 Type 2 diabetes mellitus with other circulatory complications: Secondary | ICD-10-CM | POA: Diagnosis not present

## 2021-10-09 DIAGNOSIS — I7091 Generalized atherosclerosis: Secondary | ICD-10-CM | POA: Diagnosis not present

## 2021-10-09 DIAGNOSIS — F330 Major depressive disorder, recurrent, mild: Secondary | ICD-10-CM | POA: Diagnosis not present

## 2021-10-09 DIAGNOSIS — K219 Gastro-esophageal reflux disease without esophagitis: Secondary | ICD-10-CM | POA: Diagnosis not present

## 2021-10-09 DIAGNOSIS — L603 Nail dystrophy: Secondary | ICD-10-CM | POA: Diagnosis not present

## 2021-10-10 DIAGNOSIS — I119 Hypertensive heart disease without heart failure: Secondary | ICD-10-CM | POA: Diagnosis not present

## 2021-10-10 DIAGNOSIS — E1165 Type 2 diabetes mellitus with hyperglycemia: Secondary | ICD-10-CM | POA: Diagnosis not present

## 2021-10-10 DIAGNOSIS — S81802D Unspecified open wound, left lower leg, subsequent encounter: Secondary | ICD-10-CM | POA: Diagnosis not present

## 2021-10-10 DIAGNOSIS — S90521D Blister (nonthermal), right ankle, subsequent encounter: Secondary | ICD-10-CM | POA: Diagnosis not present

## 2021-10-10 DIAGNOSIS — U071 COVID-19: Secondary | ICD-10-CM | POA: Diagnosis not present

## 2021-10-10 DIAGNOSIS — I480 Paroxysmal atrial fibrillation: Secondary | ICD-10-CM | POA: Diagnosis not present

## 2021-10-11 ENCOUNTER — Other Ambulatory Visit: Payer: Self-pay | Admitting: Internal Medicine

## 2021-10-13 DIAGNOSIS — E1165 Type 2 diabetes mellitus with hyperglycemia: Secondary | ICD-10-CM | POA: Diagnosis not present

## 2021-10-13 DIAGNOSIS — S90521D Blister (nonthermal), right ankle, subsequent encounter: Secondary | ICD-10-CM | POA: Diagnosis not present

## 2021-10-13 DIAGNOSIS — I480 Paroxysmal atrial fibrillation: Secondary | ICD-10-CM | POA: Diagnosis not present

## 2021-10-13 DIAGNOSIS — I119 Hypertensive heart disease without heart failure: Secondary | ICD-10-CM | POA: Diagnosis not present

## 2021-10-13 DIAGNOSIS — U071 COVID-19: Secondary | ICD-10-CM | POA: Diagnosis not present

## 2021-10-13 DIAGNOSIS — S81802D Unspecified open wound, left lower leg, subsequent encounter: Secondary | ICD-10-CM | POA: Diagnosis not present

## 2021-10-14 DIAGNOSIS — E1165 Type 2 diabetes mellitus with hyperglycemia: Secondary | ICD-10-CM | POA: Diagnosis not present

## 2021-10-14 DIAGNOSIS — S81802D Unspecified open wound, left lower leg, subsequent encounter: Secondary | ICD-10-CM | POA: Diagnosis not present

## 2021-10-14 DIAGNOSIS — U071 COVID-19: Secondary | ICD-10-CM | POA: Diagnosis not present

## 2021-10-14 DIAGNOSIS — I119 Hypertensive heart disease without heart failure: Secondary | ICD-10-CM | POA: Diagnosis not present

## 2021-10-14 DIAGNOSIS — I480 Paroxysmal atrial fibrillation: Secondary | ICD-10-CM | POA: Diagnosis not present

## 2021-10-14 DIAGNOSIS — S90521D Blister (nonthermal), right ankle, subsequent encounter: Secondary | ICD-10-CM | POA: Diagnosis not present

## 2021-10-15 ENCOUNTER — Ambulatory Visit: Payer: Medicare Other | Admitting: Internal Medicine

## 2021-10-15 ENCOUNTER — Other Ambulatory Visit: Payer: Medicare Other

## 2021-10-15 DIAGNOSIS — E1165 Type 2 diabetes mellitus with hyperglycemia: Secondary | ICD-10-CM | POA: Diagnosis not present

## 2021-10-15 DIAGNOSIS — I119 Hypertensive heart disease without heart failure: Secondary | ICD-10-CM | POA: Diagnosis not present

## 2021-10-15 DIAGNOSIS — I480 Paroxysmal atrial fibrillation: Secondary | ICD-10-CM | POA: Diagnosis not present

## 2021-10-15 DIAGNOSIS — S90521D Blister (nonthermal), right ankle, subsequent encounter: Secondary | ICD-10-CM | POA: Diagnosis not present

## 2021-10-15 DIAGNOSIS — S81802D Unspecified open wound, left lower leg, subsequent encounter: Secondary | ICD-10-CM | POA: Diagnosis not present

## 2021-10-15 DIAGNOSIS — U071 COVID-19: Secondary | ICD-10-CM | POA: Diagnosis not present

## 2021-10-16 DIAGNOSIS — S90521D Blister (nonthermal), right ankle, subsequent encounter: Secondary | ICD-10-CM | POA: Diagnosis not present

## 2021-10-16 DIAGNOSIS — E1165 Type 2 diabetes mellitus with hyperglycemia: Secondary | ICD-10-CM | POA: Diagnosis not present

## 2021-10-16 DIAGNOSIS — U071 COVID-19: Secondary | ICD-10-CM | POA: Diagnosis not present

## 2021-10-16 DIAGNOSIS — I119 Hypertensive heart disease without heart failure: Secondary | ICD-10-CM | POA: Diagnosis not present

## 2021-10-16 DIAGNOSIS — I480 Paroxysmal atrial fibrillation: Secondary | ICD-10-CM | POA: Diagnosis not present

## 2021-10-16 DIAGNOSIS — S81802D Unspecified open wound, left lower leg, subsequent encounter: Secondary | ICD-10-CM | POA: Diagnosis not present

## 2021-10-20 DIAGNOSIS — S90521D Blister (nonthermal), right ankle, subsequent encounter: Secondary | ICD-10-CM | POA: Diagnosis not present

## 2021-10-20 DIAGNOSIS — I119 Hypertensive heart disease without heart failure: Secondary | ICD-10-CM | POA: Diagnosis not present

## 2021-10-20 DIAGNOSIS — K219 Gastro-esophageal reflux disease without esophagitis: Secondary | ICD-10-CM | POA: Diagnosis not present

## 2021-10-20 DIAGNOSIS — I480 Paroxysmal atrial fibrillation: Secondary | ICD-10-CM | POA: Diagnosis not present

## 2021-10-20 DIAGNOSIS — M6259 Muscle wasting and atrophy, not elsewhere classified, multiple sites: Secondary | ICD-10-CM | POA: Diagnosis not present

## 2021-10-20 DIAGNOSIS — F330 Major depressive disorder, recurrent, mild: Secondary | ICD-10-CM | POA: Diagnosis not present

## 2021-10-20 DIAGNOSIS — Z7902 Long term (current) use of antithrombotics/antiplatelets: Secondary | ICD-10-CM | POA: Diagnosis not present

## 2021-10-20 DIAGNOSIS — Z7984 Long term (current) use of oral hypoglycemic drugs: Secondary | ICD-10-CM | POA: Diagnosis not present

## 2021-10-20 DIAGNOSIS — S81802D Unspecified open wound, left lower leg, subsequent encounter: Secondary | ICD-10-CM | POA: Diagnosis not present

## 2021-10-20 DIAGNOSIS — U071 COVID-19: Secondary | ICD-10-CM | POA: Diagnosis not present

## 2021-10-20 DIAGNOSIS — E1165 Type 2 diabetes mellitus with hyperglycemia: Secondary | ICD-10-CM | POA: Diagnosis not present

## 2021-10-20 DIAGNOSIS — H409 Unspecified glaucoma: Secondary | ICD-10-CM | POA: Diagnosis not present

## 2021-10-20 DIAGNOSIS — Z8673 Personal history of transient ischemic attack (TIA), and cerebral infarction without residual deficits: Secondary | ICD-10-CM | POA: Diagnosis not present

## 2021-10-20 DIAGNOSIS — Z9181 History of falling: Secondary | ICD-10-CM | POA: Diagnosis not present

## 2021-10-21 DIAGNOSIS — E1165 Type 2 diabetes mellitus with hyperglycemia: Secondary | ICD-10-CM | POA: Diagnosis not present

## 2021-10-21 DIAGNOSIS — U071 COVID-19: Secondary | ICD-10-CM | POA: Diagnosis not present

## 2021-10-21 DIAGNOSIS — S81802D Unspecified open wound, left lower leg, subsequent encounter: Secondary | ICD-10-CM | POA: Diagnosis not present

## 2021-10-21 DIAGNOSIS — I119 Hypertensive heart disease without heart failure: Secondary | ICD-10-CM | POA: Diagnosis not present

## 2021-10-21 DIAGNOSIS — I480 Paroxysmal atrial fibrillation: Secondary | ICD-10-CM | POA: Diagnosis not present

## 2021-10-21 DIAGNOSIS — S90521D Blister (nonthermal), right ankle, subsequent encounter: Secondary | ICD-10-CM | POA: Diagnosis not present

## 2021-10-22 ENCOUNTER — Emergency Department: Payer: Medicare Other

## 2021-10-22 ENCOUNTER — Inpatient Hospital Stay
Admission: EM | Admit: 2021-10-22 | Discharge: 2021-10-30 | DRG: 637 | Disposition: A | Discharge status: Skilled Nursing Facility | Payer: Medicare Other | Attending: Family Medicine | Admitting: Family Medicine

## 2021-10-22 DIAGNOSIS — E440 Moderate protein-calorie malnutrition: Secondary | ICD-10-CM | POA: Diagnosis not present

## 2021-10-22 DIAGNOSIS — F03A4 Unspecified dementia, mild, with anxiety: Secondary | ICD-10-CM | POA: Diagnosis not present

## 2021-10-22 DIAGNOSIS — Z8249 Family history of ischemic heart disease and other diseases of the circulatory system: Secondary | ICD-10-CM

## 2021-10-22 DIAGNOSIS — E11621 Type 2 diabetes mellitus with foot ulcer: Secondary | ICD-10-CM | POA: Diagnosis not present

## 2021-10-22 DIAGNOSIS — R6510 Systemic inflammatory response syndrome (SIRS) of non-infectious origin without acute organ dysfunction: Secondary | ICD-10-CM | POA: Diagnosis present

## 2021-10-22 DIAGNOSIS — H9193 Unspecified hearing loss, bilateral: Secondary | ICD-10-CM | POA: Diagnosis present

## 2021-10-22 DIAGNOSIS — N179 Acute kidney failure, unspecified: Secondary | ICD-10-CM | POA: Diagnosis present

## 2021-10-22 DIAGNOSIS — I4891 Unspecified atrial fibrillation: Secondary | ICD-10-CM

## 2021-10-22 DIAGNOSIS — E861 Hypovolemia: Secondary | ICD-10-CM | POA: Diagnosis present

## 2021-10-22 DIAGNOSIS — D72829 Elevated white blood cell count, unspecified: Secondary | ICD-10-CM | POA: Diagnosis present

## 2021-10-22 DIAGNOSIS — R404 Transient alteration of awareness: Secondary | ICD-10-CM | POA: Diagnosis not present

## 2021-10-22 DIAGNOSIS — E860 Dehydration: Secondary | ICD-10-CM | POA: Diagnosis not present

## 2021-10-22 DIAGNOSIS — Z7984 Long term (current) use of oral hypoglycemic drugs: Secondary | ICD-10-CM | POA: Diagnosis not present

## 2021-10-22 DIAGNOSIS — R339 Retention of urine, unspecified: Secondary | ICD-10-CM | POA: Diagnosis present

## 2021-10-22 DIAGNOSIS — R112 Nausea with vomiting, unspecified: Secondary | ICD-10-CM | POA: Diagnosis present

## 2021-10-22 DIAGNOSIS — E43 Unspecified severe protein-calorie malnutrition: Secondary | ICD-10-CM | POA: Diagnosis present

## 2021-10-22 DIAGNOSIS — Z66 Do not resuscitate: Secondary | ICD-10-CM | POA: Diagnosis present

## 2021-10-22 DIAGNOSIS — Z800 Family history of malignant neoplasm of digestive organs: Secondary | ICD-10-CM

## 2021-10-22 DIAGNOSIS — Z681 Body mass index (BMI) 19 or less, adult: Secondary | ICD-10-CM | POA: Diagnosis not present

## 2021-10-22 DIAGNOSIS — G9341 Metabolic encephalopathy: Secondary | ICD-10-CM | POA: Diagnosis present

## 2021-10-22 DIAGNOSIS — K828 Other specified diseases of gallbladder: Secondary | ICD-10-CM | POA: Diagnosis not present

## 2021-10-22 DIAGNOSIS — M6259 Muscle wasting and atrophy, not elsewhere classified, multiple sites: Secondary | ICD-10-CM | POA: Diagnosis not present

## 2021-10-22 DIAGNOSIS — I480 Paroxysmal atrial fibrillation: Secondary | ICD-10-CM | POA: Diagnosis present

## 2021-10-22 DIAGNOSIS — Z833 Family history of diabetes mellitus: Secondary | ICD-10-CM | POA: Diagnosis not present

## 2021-10-22 DIAGNOSIS — R41841 Cognitive communication deficit: Secondary | ICD-10-CM | POA: Diagnosis not present

## 2021-10-22 DIAGNOSIS — Z9049 Acquired absence of other specified parts of digestive tract: Secondary | ICD-10-CM

## 2021-10-22 DIAGNOSIS — D72828 Other elevated white blood cell count: Secondary | ICD-10-CM | POA: Diagnosis not present

## 2021-10-22 DIAGNOSIS — Z7189 Other specified counseling: Secondary | ICD-10-CM | POA: Diagnosis not present

## 2021-10-22 DIAGNOSIS — R54 Age-related physical debility: Secondary | ICD-10-CM | POA: Diagnosis not present

## 2021-10-22 DIAGNOSIS — E1169 Type 2 diabetes mellitus with other specified complication: Secondary | ICD-10-CM

## 2021-10-22 DIAGNOSIS — K429 Umbilical hernia without obstruction or gangrene: Secondary | ICD-10-CM | POA: Diagnosis not present

## 2021-10-22 DIAGNOSIS — I10 Essential (primary) hypertension: Secondary | ICD-10-CM | POA: Diagnosis present

## 2021-10-22 DIAGNOSIS — Z79899 Other long term (current) drug therapy: Secondary | ICD-10-CM

## 2021-10-22 DIAGNOSIS — N3289 Other specified disorders of bladder: Secondary | ICD-10-CM | POA: Diagnosis not present

## 2021-10-22 DIAGNOSIS — R4781 Slurred speech: Secondary | ICD-10-CM | POA: Diagnosis present

## 2021-10-22 DIAGNOSIS — L97509 Non-pressure chronic ulcer of other part of unspecified foot with unspecified severity: Secondary | ICD-10-CM | POA: Diagnosis not present

## 2021-10-22 DIAGNOSIS — L8990 Pressure ulcer of unspecified site, unspecified stage: Secondary | ICD-10-CM | POA: Insufficient documentation

## 2021-10-22 DIAGNOSIS — E1110 Type 2 diabetes mellitus with ketoacidosis without coma: Secondary | ICD-10-CM | POA: Diagnosis not present

## 2021-10-22 DIAGNOSIS — Z741 Need for assistance with personal care: Secondary | ICD-10-CM | POA: Diagnosis not present

## 2021-10-22 DIAGNOSIS — E119 Type 2 diabetes mellitus without complications: Secondary | ICD-10-CM

## 2021-10-22 DIAGNOSIS — R6520 Severe sepsis without septic shock: Secondary | ICD-10-CM

## 2021-10-22 DIAGNOSIS — I517 Cardiomegaly: Secondary | ICD-10-CM | POA: Diagnosis not present

## 2021-10-22 DIAGNOSIS — Z823 Family history of stroke: Secondary | ICD-10-CM | POA: Diagnosis not present

## 2021-10-22 DIAGNOSIS — R5383 Other fatigue: Secondary | ICD-10-CM | POA: Diagnosis not present

## 2021-10-22 DIAGNOSIS — I700 Atherosclerosis of aorta: Secondary | ICD-10-CM | POA: Diagnosis not present

## 2021-10-22 DIAGNOSIS — A419 Sepsis, unspecified organism: Secondary | ICD-10-CM | POA: Diagnosis not present

## 2021-10-22 DIAGNOSIS — K4090 Unilateral inguinal hernia, without obstruction or gangrene, not specified as recurrent: Secondary | ICD-10-CM | POA: Diagnosis not present

## 2021-10-22 DIAGNOSIS — E870 Hyperosmolality and hypernatremia: Secondary | ICD-10-CM | POA: Diagnosis present

## 2021-10-22 DIAGNOSIS — Z8673 Personal history of transient ischemic attack (TIA), and cerebral infarction without residual deficits: Secondary | ICD-10-CM

## 2021-10-22 DIAGNOSIS — F3289 Other specified depressive episodes: Secondary | ICD-10-CM | POA: Diagnosis not present

## 2021-10-22 DIAGNOSIS — E7800 Pure hypercholesterolemia, unspecified: Secondary | ICD-10-CM | POA: Diagnosis not present

## 2021-10-22 DIAGNOSIS — Z7902 Long term (current) use of antithrombotics/antiplatelets: Secondary | ICD-10-CM

## 2021-10-22 DIAGNOSIS — F419 Anxiety disorder, unspecified: Secondary | ICD-10-CM | POA: Diagnosis not present

## 2021-10-22 DIAGNOSIS — R2681 Unsteadiness on feet: Secondary | ICD-10-CM | POA: Diagnosis not present

## 2021-10-22 DIAGNOSIS — I959 Hypotension, unspecified: Secondary | ICD-10-CM | POA: Diagnosis not present

## 2021-10-22 DIAGNOSIS — E1165 Type 2 diabetes mellitus with hyperglycemia: Secondary | ICD-10-CM | POA: Diagnosis not present

## 2021-10-22 DIAGNOSIS — R4182 Altered mental status, unspecified: Secondary | ICD-10-CM | POA: Diagnosis not present

## 2021-10-22 DIAGNOSIS — R1111 Vomiting without nausea: Secondary | ICD-10-CM | POA: Diagnosis not present

## 2021-10-22 DIAGNOSIS — R739 Hyperglycemia, unspecified: Secondary | ICD-10-CM | POA: Diagnosis not present

## 2021-10-22 DIAGNOSIS — R000 Tachycardia, unspecified: Secondary | ICD-10-CM | POA: Diagnosis not present

## 2021-10-22 DIAGNOSIS — Z90710 Acquired absence of both cervix and uterus: Secondary | ICD-10-CM

## 2021-10-22 DIAGNOSIS — R531 Weakness: Secondary | ICD-10-CM | POA: Diagnosis not present

## 2021-10-22 DIAGNOSIS — E782 Mixed hyperlipidemia: Secondary | ICD-10-CM | POA: Diagnosis not present

## 2021-10-22 DIAGNOSIS — R110 Nausea: Secondary | ICD-10-CM | POA: Diagnosis not present

## 2021-10-22 DIAGNOSIS — Z7401 Bed confinement status: Secondary | ICD-10-CM | POA: Diagnosis not present

## 2021-10-22 DIAGNOSIS — T383X5A Adverse effect of insulin and oral hypoglycemic [antidiabetic] drugs, initial encounter: Secondary | ICD-10-CM | POA: Diagnosis present

## 2021-10-22 DIAGNOSIS — R1311 Dysphagia, oral phase: Secondary | ICD-10-CM | POA: Diagnosis not present

## 2021-10-22 HISTORY — DX: Elevated white blood cell count, unspecified: D72.829

## 2021-10-22 HISTORY — DX: Sepsis, unspecified organism: A41.9

## 2021-10-22 HISTORY — DX: Nausea with vomiting, unspecified: R11.2

## 2021-10-22 HISTORY — DX: Metabolic encephalopathy: G93.41

## 2021-10-22 HISTORY — DX: Acute kidney failure, unspecified: N17.9

## 2021-10-22 HISTORY — DX: Type 2 diabetes mellitus with ketoacidosis without coma: E11.10

## 2021-10-22 LAB — CBC WITH DIFFERENTIAL/PLATELET
Abs Immature Granulocytes: 0.1 10*3/uL — ABNORMAL HIGH (ref 0.00–0.07)
Basophils Absolute: 0 10*3/uL (ref 0.0–0.1)
Basophils Relative: 0 %
Eosinophils Absolute: 0.1 10*3/uL (ref 0.0–0.5)
Eosinophils Relative: 1 %
HCT: 52 % — ABNORMAL HIGH (ref 36.0–46.0)
Hemoglobin: 16.2 g/dL — ABNORMAL HIGH (ref 12.0–15.0)
Immature Granulocytes: 1 %
Lymphocytes Relative: 14 %
Lymphs Abs: 2 10*3/uL (ref 0.7–4.0)
MCH: 29.3 pg (ref 26.0–34.0)
MCHC: 31.2 g/dL (ref 30.0–36.0)
MCV: 94.2 fL (ref 80.0–100.0)
Monocytes Absolute: 0.8 10*3/uL (ref 0.1–1.0)
Monocytes Relative: 6 %
Neutro Abs: 11.2 10*3/uL — ABNORMAL HIGH (ref 1.7–7.7)
Neutrophils Relative %: 78 %
Platelets: 199 10*3/uL (ref 150–400)
RBC: 5.52 MIL/uL — ABNORMAL HIGH (ref 3.87–5.11)
RDW: 13.4 % (ref 11.5–15.5)
WBC: 14.2 10*3/uL — ABNORMAL HIGH (ref 4.0–10.5)
nRBC: 0 % (ref 0.0–0.2)

## 2021-10-22 LAB — URINALYSIS, ROUTINE W REFLEX MICROSCOPIC
Bilirubin Urine: NEGATIVE
Glucose, UA: >=500 mg/dL — AB
Hgb urine dipstick: NEGATIVE
Ketones, ur: 20 mg/dL — AB
Leukocytes,Ua: NEGATIVE
Nitrite: NEGATIVE
Protein, ur: NEGATIVE mg/dL
Specific Gravity, Urine: 1.027 (ref 1.005–1.030)
pH: 5 (ref 5.0–8.0)

## 2021-10-22 LAB — BLOOD GAS, VENOUS
Acid-base deficit: 2.2 mmol/L — ABNORMAL HIGH (ref 0.0–2.0)
Bicarbonate: 22.4 mmol/L (ref 20.0–28.0)
O2 Saturation: 92.7 %
Patient temperature: 37
pCO2, Ven: 37 mmHg — ABNORMAL LOW (ref 44–60)
pH, Ven: 7.39 (ref 7.25–7.43)
pO2, Ven: 62 mmHg — ABNORMAL HIGH (ref 32–45)

## 2021-10-22 LAB — MRSA NEXT GEN BY PCR, NASAL: MRSA by PCR Next Gen: NOT DETECTED

## 2021-10-22 LAB — COMPREHENSIVE METABOLIC PANEL
ALT: 17 U/L (ref 0–44)
AST: 22 U/L (ref 15–41)
Albumin: 3.7 g/dL (ref 3.5–5.0)
Alkaline Phosphatase: 69 U/L (ref 38–126)
Anion gap: 17 — ABNORMAL HIGH (ref 5–15)
BUN: 56 mg/dL — ABNORMAL HIGH (ref 8–23)
CO2: 22 mmol/L (ref 22–32)
Calcium: 9.5 mg/dL (ref 8.9–10.3)
Chloride: 110 mmol/L (ref 98–111)
Creatinine, Ser: 1.19 mg/dL — ABNORMAL HIGH (ref 0.44–1.00)
GFR, Estimated: 41 mL/min — ABNORMAL LOW (ref >60)
Glucose, Bld: 287 mg/dL — ABNORMAL HIGH (ref 70–99)
Potassium: 4 mmol/L (ref 3.5–5.1)
Sodium: 149 mmol/L — ABNORMAL HIGH (ref 135–145)
Total Bilirubin: 1.7 mg/dL — ABNORMAL HIGH (ref 0.3–1.2)
Total Protein: 7.8 g/dL (ref 6.5–8.1)

## 2021-10-22 LAB — GLUCOSE, CAPILLARY
Glucose-Capillary: 161 mg/dL — ABNORMAL HIGH (ref 70–99)
Glucose-Capillary: 188 mg/dL — ABNORMAL HIGH (ref 70–99)
Glucose-Capillary: 233 mg/dL — ABNORMAL HIGH (ref 70–99)

## 2021-10-22 LAB — LIPASE, BLOOD: Lipase: 36 U/L (ref 11–51)

## 2021-10-22 LAB — MAGNESIUM: Magnesium: 2.4 mg/dL (ref 1.7–2.4)

## 2021-10-22 LAB — PROCALCITONIN: Procalcitonin: <0.1 ng/mL

## 2021-10-22 MED ORDER — LACTATED RINGERS IV BOLUS
1000.0000 mL | Freq: Once | INTRAVENOUS | Status: AC
  Administered 2021-10-22: 1000 mL via INTRAVENOUS

## 2021-10-22 MED ORDER — CHLORHEXIDINE GLUCONATE CLOTH 2 % EX PADS
6.0000 | MEDICATED_PAD | Freq: Every day | CUTANEOUS | Status: DC
  Administered 2021-10-22 – 2021-10-29 (×7): 6 via TOPICAL

## 2021-10-22 MED ORDER — ENOXAPARIN SODIUM 40 MG/0.4ML IJ SOSY: 40.0000 mg | PREFILLED_SYRINGE | INTRAMUSCULAR | Status: DC

## 2021-10-22 MED ORDER — METOPROLOL TARTRATE 5 MG/5ML IV SOLN: 5.0000 mg | INTRAVENOUS | Status: DC | PRN

## 2021-10-22 MED ORDER — ONDANSETRON HCL 4 MG PO TABS: 4.0000 mg | ORAL_TABLET | Freq: Four times a day (QID) | ORAL | Status: DC | PRN

## 2021-10-22 MED ORDER — SERTRALINE HCL 50 MG PO TABS
50.0000 mg | ORAL_TABLET | Freq: Every day | ORAL | Status: DC
  Administered 2021-10-24 – 2021-10-30 (×7): 50 mg via ORAL
  Filled 2021-10-22 (×8): qty 1

## 2021-10-22 MED ORDER — POTASSIUM CHLORIDE 10 MEQ/100ML IV SOLN
10.0000 meq | INTRAVENOUS | Status: AC
  Administered 2021-10-22: 10 meq via INTRAVENOUS
  Filled 2021-10-22 (×2): qty 100

## 2021-10-22 MED ORDER — METRONIDAZOLE 500 MG/100ML IV SOLN
500.0000 mg | Freq: Two times a day (BID) | INTRAVENOUS | Status: DC
  Administered 2021-10-23: 500 mg via INTRAVENOUS
  Filled 2021-10-22 (×3): qty 100

## 2021-10-22 MED ORDER — ACETAMINOPHEN 650 MG RE SUPP: 650.0000 mg | Freq: Four times a day (QID) | RECTAL | Status: AC | PRN

## 2021-10-22 MED ORDER — SODIUM CHLORIDE 0.9 % IV SOLN
2.0000 g | Freq: Once | INTRAVENOUS | Status: AC
  Administered 2021-10-23: 2 g via INTRAVENOUS
  Filled 2021-10-22: qty 12.5

## 2021-10-22 MED ORDER — ONDANSETRON HCL 4 MG/2ML IJ SOLN
4.0000 mg | Freq: Four times a day (QID) | INTRAMUSCULAR | Status: DC | PRN
  Administered 2021-10-27: 4 mg via INTRAVENOUS
  Filled 2021-10-22: qty 2

## 2021-10-22 MED ORDER — ACETAMINOPHEN 500 MG PO TABS
1000.0000 mg | ORAL_TABLET | Freq: Four times a day (QID) | ORAL | Status: AC | PRN
  Administered 2021-10-25: 1000 mg via ORAL
  Filled 2021-10-22: qty 2

## 2021-10-22 MED ORDER — LACTATED RINGERS IV SOLN: INTRAVENOUS | Status: DC

## 2021-10-22 MED ORDER — INSULIN REGULAR(HUMAN) IN NACL 100-0.9 UT/100ML-% IV SOLN
INTRAVENOUS | Status: DC
  Administered 2021-10-22: 5 [IU]/h via INTRAVENOUS
  Filled 2021-10-22: qty 100

## 2021-10-22 MED ORDER — VANCOMYCIN HCL 1250 MG/250ML IV SOLN
1250.0000 mg | Freq: Once | INTRAVENOUS | Status: DC
  Filled 2021-10-22 (×2): qty 250

## 2021-10-22 MED ORDER — METOPROLOL TARTRATE 5 MG/5ML IV SOLN
5.0000 mg | INTRAVENOUS | Status: DC | PRN
Start: 2021-10-22 — End: 2021-10-22

## 2021-10-22 MED ORDER — DEXTROSE IN LACTATED RINGERS 5 % IV SOLN
INTRAVENOUS | Status: DC
  Administered 2021-10-22: 1000 mL via INTRAVENOUS

## 2021-10-22 MED ORDER — AMLODIPINE BESYLATE 5 MG PO TABS: 5.0000 mg | ORAL_TABLET | Freq: Every day | ORAL | Status: DC

## 2021-10-22 MED ORDER — METOPROLOL TARTRATE 5 MG/5ML IV SOLN
5.0000 mg | INTRAVENOUS | Status: DC | PRN
  Administered 2021-10-22: 5 mg via INTRAVENOUS
  Filled 2021-10-22: qty 5

## 2021-10-22 MED ORDER — LACTATED RINGERS IV BOLUS (SEPSIS): 1000.0000 mL | Freq: Once | INTRAVENOUS | Status: DC

## 2021-10-22 MED ORDER — DEXTROSE 50 % IV SOLN: 0.0000 mL | INTRAVENOUS | Status: DC | PRN

## 2021-10-22 MED ORDER — METOPROLOL TARTRATE 5 MG/5ML IV SOLN: 5.0000 mg | Freq: Two times a day (BID) | INTRAVENOUS | Status: DC | PRN

## 2021-10-22 MED ORDER — LACTATED RINGERS IV BOLUS
500.0000 mL | Freq: Once | INTRAVENOUS | Status: AC
  Administered 2021-10-22: 500 mL via INTRAVENOUS

## 2021-10-22 MED ORDER — CLOPIDOGREL BISULFATE 75 MG PO TABS
75.0000 mg | ORAL_TABLET | Freq: Every day | ORAL | Status: DC
  Administered 2021-10-24 – 2021-10-30 (×7): 75 mg via ORAL
  Filled 2021-10-22 (×7): qty 1

## 2021-10-22 MED ORDER — ROSUVASTATIN CALCIUM 10 MG PO TABS
5.0000 mg | ORAL_TABLET | ORAL | Status: DC
Start: 2021-10-23 — End: 2021-10-30
  Administered 2021-10-27 – 2021-10-30 (×2): 5 mg via ORAL
  Filled 2021-10-22 (×2): qty 1

## 2021-10-22 MED ORDER — LACTATED RINGERS IV BOLUS: 20.0000 mL/kg | Freq: Once | INTRAVENOUS | Status: DC

## 2021-10-22 MED ORDER — ENOXAPARIN SODIUM 30 MG/0.3ML IJ SOSY
30.0000 mg | PREFILLED_SYRINGE | INTRAMUSCULAR | Status: DC
  Administered 2021-10-22 – 2021-10-29 (×8): 30 mg via SUBCUTANEOUS
  Filled 2021-10-22 (×8): qty 0.3

## 2021-10-22 NOTE — ED Notes (Signed)
Patient transported to CT 

## 2021-10-22 NOTE — ED Notes (Signed)
Pt given blanket.

## 2021-10-22 NOTE — ED Notes (Signed)
Pt BIBA for n/v x 3 days, with new onset AMS. Pt a/ox1, able to follow some basic commands. Per family, pt vomiting x 3 days, denies diarrhea, dysuria or foul urine, CP, SOB, ABD pain.  ?

## 2021-10-22 NOTE — Progress Notes (Signed)
CODE SEPSIS - PHARMACY COMMUNICATION ? ?**Broad Spectrum Antibiotics should be administered within 1 hour of Sepsis diagnosis** ? ?Time Code Sepsis Called/Page Received: 2210 ? ?Antibiotics Ordered: Cefepime, Flagyl, Vancomycin ? ?Time of 1st antibiotic administration: 0030 ? ?Additional action taken: Pt moved from ED to ICU prior to initial abx being started.  Spoke with RN on ICU.  Pt arrived without any abx having been pulled from ED Pyxis or Vancomycin dose initially tubed to ED by pharmacy. Pharmacy redispensed  and tubed abx doses to ICU, resulting in delay in first abx administration. ? ?If necessary, Name of Provider/Nurse Contacted: Tess, RN (ICU) ? ?Renda Rolls, PharmD, MBA ?10/22/2021 ?10:12 PM ? ? ?

## 2021-10-22 NOTE — Assessment & Plan Note (Addendum)
Home amlodipine was held on admission with soft blood pressures while on Cardizem drip.  BPs have improved since. ?--Now on Cardizem CD 240 mg daily for BP and HR control ?--Stop amlodipine ?

## 2021-10-22 NOTE — Assessment & Plan Note (Addendum)
Resolved.  Mild, possibly due to Jardiance, patient has never been on insulin.  Patient presented with anion gap metabolic acidosis, ketonuria.   ?Resolved with insulin infusion. ?Transitioned to subcutaneous insulin. ?

## 2021-10-22 NOTE — Assessment & Plan Note (Addendum)
POA, appears resolved.  Likely due to DKA.  Yesterday, patient was lethargic, only opened eyes briefly.  Family report baseline mild dementia with no sundowning type behaviors.  No focal neurologic deficits.  This morning, patient sitting up, awake, talkative able to express needs.  Working with SLP. ?Appears at her baseline mental status. ?

## 2021-10-22 NOTE — Progress Notes (Signed)
Pharmacy Antibiotic Note ? ?Angela Patterson is a 88 y.o. female admitted on 10/22/2021 with sepsis from unknown source.  Pharmacy has been consulted for Cefepime and Vancomycin dosing x 7 days ? ?Plan: ?Cefepime 2 gm q24h per indication and renal fxn. ? ?Pt ordered initial dose of Vancomycin 1 gm ?Vancomycin 750 mg IV Q 48 hrs.  ?Goal AUC 400-550. ?Expected AUC: 510.5 ?SCr used: 0.93 ?TBW 43.3 kg < IBW 54.7 kg ? ?Pharmacy will continue to follow and will adjust abx dosing whenever warranted. ? ? ?Height: '5\' 4"'$  (162.6 cm) ?Weight: 54.4 kg (120 lb) ?IBW/kg (Calculated) : 54.7 ? ?Temp (24hrs), Avg:97.8 ?F (36.6 ?C), Min:97.8 ?F (36.6 ?C), Max:97.8 ?F (36.6 ?C) ? ?Recent Labs  ?Lab 10/22/21 ?1813  ?WBC 14.2*  ?CREATININE 1.19*  ?  ?Estimated Creatinine Clearance: 22.7 mL/min (A) (by C-G formula based on SCr of 1.19 mg/dL (H)).   ? ?Allergies  ?Allergen Reactions  ? Actos [Pioglitazone] Swelling  ? Contrast Media [Iodinated Contrast Media] Swelling  ? Prandin [Repaglinide] Swelling  ? Pravastatin Sodium   ?  cramps  ? ? ?Antimicrobials this admission: ?4/06 Vancomycin >> x 7 days ?4/06 Cefepime >> x 7 days ?4/06 Metronidazole >> x 7 days ? ?Microbiology results: ?4/05 BCx: Pending ? ?Thank you for allowing pharmacy to be a part of this patient?s care. ? ?Renda Rolls, PharmD, MBA ?10/22/2021 ?10:13 PM ? ? ?

## 2021-10-22 NOTE — Sepsis Progress Note (Signed)
Patient transferred to the floor from the ED without having initial lactic or blood cultures drawn, or having antibiotics started.  There may be a delay due to this.  Message sent via secure chat to new bedside RN requesting lab draws & initiation of antibiotics. ?

## 2021-10-22 NOTE — Assessment & Plan Note (Addendum)
Resolved. Presented with creatinine 1.19, mild AKI.  Prerenal etiology in the setting of dehydration related to DKA.   ?Creatinine normalized with IV fluids.   ?--Monitor BMP. ? ?

## 2021-10-22 NOTE — Assessment & Plan Note (Addendum)
Continue home sertraline 

## 2021-10-22 NOTE — Hospital Course (Addendum)
[name] is a 88 year old female with history of paroxysmal atrial fibrillation, hypertension, history of CVA, dementia, non-insulin-dependent diabetes mellitus, who presents emergency department for chief concerns of nausea and vomiting and altered mental status.  Patient takes Jardiance for her diabetes. ? ?ED course: Afebrile, RR 22, HR 102, BP 160/61 and improved to 150/103, spO2 of 96% on room air.  Labs notable for hypernatremia 149, BUN 56, creatinine 1.19, glucose 287, GFR 41, WBC 14.2, hemoconcentration with Hbg 16.2.  Anion gap elevated at 17, total bili 1.7, urinalysis with ketonuria but no evidence of infection. ? ?Patient admitted with DKA likely secondary to [facility].  Started on insulin infusion.  Once DKA resolved, patient transitioned to subcutaneous insulin.  [name] is held and will be discontinued at discharge. ? ?Patient developed A-fib with RVR requiring Cardizem drip.  Blood pressures are soft but MAP stable, continued monitoring patient in ICU another day. ?Started on short-acting oral Cardizem 4/7 with reasonable HR control (variable HR 52-110), BP's stable. ? ?

## 2021-10-22 NOTE — H&P (Addendum)
?History and Physical  ? ?DANNELL RACZKOWSKI XNA:[id number] DOB: Sep 23, 1920 DOA: 10/22/2021 ? ?PCP: Einar Pheasant, MD  ?Outpatient Specialists: Dr. Amalia Hailey, podiatry ?Patient coming from: Home via EMS ? ?I have personally briefly reviewed patient's old medical records in [place]. ? ?Chief Concern: Altered mental status ? ?HPI: [name] is a 88 year old female with history of paroxysmal atrial fibrillation, hypertension, history of CVA, dementia, non-insulin-dependent diabetes mellitus, who presents emergency department for chief concerns of nausea and vomiting and altered mental status. ? ?Initial vitals in the emergency department showed temperature of 97.8, respiration rate 22, heart rate of 102, initial blood pressure 160/61 and improved to 150/103, SPO2 of 96% on room air. ? ?Serum sodium 149, potassium 4.0, chloride of 110, bicarb of 22, BUN of 56, serum creatinine of 1.19, nonfasting blood glucose 287, GFR 41, WBC 14.2, hemoglobin 16.2, platelets of 199. ? ?Anion gap was elevated at 17.  T. bili elevated at 1.7.  UA was found to be negative for leukocytes and nitrites and positive for ketones. ? ?ED treatment: Lactated ringer bolus for total of 1.5 L, metoprolol tartrate 5 mg IV one-time dose. ? ?At bedside patient was able to tell me her name, her age, and she knows her daughters and a son-in-law at bedside. ? ?She denies being in any pain at this time.  She appears frail and tries to participate in HPI however she does have severe hard of hearing.  She does not appear to be in acute distress. ? ?Daughter states that on Sunday, 10/19/2021, [facility] facility called daughter stating that patient was not feeling well and had some nausea and vomiting.  They denied any reported fever or diarrhea.  Patient has been in bed all day.  On day of admission family decided to call EMS because patient was becoming weaker and more lethargic. ? ?Daughter states that at baseline patient sits up and looks out the window  and is more active and interactive and answers more questions.  Since her mom has been laying in bed and has had poor p.o. intake along with nausea and vomiting, daughter felt that patient may be dehydrated and is more confused thus prompting EMS to be called for ED presentation. ? ?Social history: She lives at [place]  ? ?ROS: Unable to complete due to dementia and severe hearing loss ? ?ED Course: Discussed with EDP, patient requiring hospitalization for chief concerns of intractable nausea and vomiting, AKI and altered mental status. ? ?Assessment/Plan ? ?Principal Problem: ?  DKA (diabetic ketoacidosis) ([place]) ?Active Problems: ?  Hypercholesterolemia ?  History of CVA (cerebrovascular accident) ?  Essential hypertension ?  Pure hypercholesterolemia ?  Type 2 diabetes mellitus ([place]) ?  Anxiety ?  Acute metabolic encephalopathy ?  Paroxysmal atrial fibrillation (H[facility]) ?  Leukocytosis ?  SIRS due to infectious process with acute organ dysfunction ([place]) ?  Nausea and vomiting ?  AKI (acute kidney injury) ([place]) ?  Hypernatremia ?  ?Assessment and Plan: ? ?* DKA (diabetic ketoacidosis) ([place]) ?- Suspect mild, checking lactic acid ?- Presumed secondary to GI loss ?- Etiology work-up in progress ?- Check troponin stat, check procalcitonin, lactic acid ?- Admit to stepdown inpatient ?- Endo tool started ?- N.p.o. except for sips of meds and ice chips ? ?Hypernatremia ?- Presumed chronic ?- Serum sodium on presentation was 149 and on recheck is 148, start D5W at 125 mL/h ?- Discussed with nursing staff ? ?AKI (acute kidney injury) ([place]) ?- Suspect multifactorial including  prerenal and postrenal  ?- Status post Foley placement in the emergency department ?- Fluid treatment per sepsis and hyperglycemia presumed DKA ?- Would recommend a.m. team consider nephrology consultation if serum creatinine does not improve in the a.m. ?- BMP in the a.m. ? ?Nausea and vomiting ?- As needed ondansetron p.o. and IV ordered ? ?SIRS due to  infectious process with acute organ dysfunction ([place]) ?- Work-up in progress for source ?- Portable chest x-ray was negative for acute cardiopulmonary process ?- Check procalcitonin, lactic acid, high sensitive troponin ?- Patient has elevated respiration rate, heart rate, leukocytosis, altered mental status/neuro involvement, organ involvement is endocrine ?- Blood cultures x2, lactic acid ordered ?- Status post 1.5 L bolus per EDP ?- Ordered additional LR 1 L bolus ?- LR IVF ordered per DKA order set ?- Discussed with nursing staff at bedside that labs will need to be obtained stat ? ?Leukocytosis ?- Check procalcitonin and lactic acid stat ?- Work-up in progress ? ?Acute metabolic encephalopathy ?- Per family, she is currently not at her baseline ?- Patient's baseline patient is sitting up, and not sleeping in bed all day and more active ? ?Anxiety ?- Sertraline 50 mg daily resumed ? ?Essential hypertension ?- Resumed home amlodipine 5 mg daily ? ?Hypercholesterolemia ?- Rosuvastatin 5 mg daily resumed ? ?Chart reviewed.  ? ?DVT prophylaxis: Enoxaparin ?Code Status: DNR, confirmed with family ?Diet: N.p.o. except for sips with meds and ice chips ?Family Communication: Updated daughters and son-in-law at bedside ?Disposition Plan: Pending clinical course ?Consults called: None at this time ?Admission status: Stepdown, inpatient ? ?Past Medical History:  ?Diagnosis Date  ? Allergy   ? Chicken pox   ? CVA (cerebral vascular accident) [facility])   ? Diabetes mellitus ([place])   ? Hypercholesterolemia   ? Hypertension   ? ?Past Surgical History:  ?Procedure Laterality Date  ? ABDOMINAL HYSTERECTOMY  1971  ? excessive bleeding  ? APPENDECTOMY  age 18  ? BREAST LUMPECTOMY  1958  ? benign  ? [place] OF UTERUS  1970  ? ?Social History:  reports that she has never smoked. She has never used smokeless tobacco. She reports that she does not drink alcohol and does not use drugs. ? ?Allergies  ?Allergen Reactions  ?  Actos [Pioglitazone] Swelling  ? Contrast Media [Iodinated Contrast Media] Swelling  ? Prandin [Repaglinide] Swelling  ? Pravastatin Sodium   ?  cramps  ? ?Family History  ?Problem Relation Age of Onset  ? Liver cancer Father   ? Stroke Mother   ? Heart attack Brother   ? Hypertension Daughter   ? Hypertension Son   ? Hypertension Daughter   ? Cancer Grandchild   ?     breast  ? Diabetes Grandchild   ? Breast cancer Neg Hx   ? Colon cancer Neg Hx   ? ?Family history: Family history reviewed and not pertinent. ? ?Prior to Admission medications   ?Medication Sig Start Date End Date Taking? Authorizing Provider  ?acetaminophen (TYLENOL) 325 MG tablet Take 325 mg by mouth every 4 (four) hours as needed for mild pain.   Yes [provider]  ?amLODipine (NORVASC) 5 MG tablet TAKE 1 TABLET(5 MG) BY MOUTH TWICE DAILY 04/17/21  Yes Einar Pheasant, MD  ?ascorbic acid (VITAMIN C) 500 MG tablet Take 500 mg by mouth daily.   Yes [provider]  ?Cholecalciferol (VITAMIN D) 2000 units tablet Take 2,000 Units by mouth daily.    Yes [provider]  ?  clopidogrel (PLAVIX) 75 MG tablet TAKE 1 TABLET(75 MG) BY MOUTH DAILY 10/11/21  Yes Kennyth Arnold, FNP  ?Cranberry 500 MG CAPS Take 500 mg by mouth daily.   Yes [provider]  ?empagliflozin (JARDIANCE) 25 MG TABS tablet Take 1 tablet (25 mg total) by mouth daily. 01/22/21  Yes Einar Pheasant, MD  ?Astrid Drafts 1000 MG CAPS Take 1 capsule by mouth daily.   Yes [provider]  ?Multiple Vitamin (MULTIVITAMIN) tablet Take 1 tablet by mouth daily.   Yes [provider]  ?omeprazole (PRILOSEC) 20 MG capsule Take 20 mg by mouth daily.   Yes [provider]  ?ondansetron (ZOFRAN) 4 MG tablet Take 4 mg by mouth every 6 (six) hours as needed for nausea or vomiting.   Yes [provider]  ?rosuvastatin (CRESTOR) 5 MG tablet TAKE 1 TABLET(5 MG) BY MOUTH 2 TIMES A WEEK ?Patient taking differently: Take 5 mg by mouth See  admin instructions. Take 1 tablet ('5mg'$ ) by mouth every Monday and Thursday morning 03/14/21  Yes Einar Pheasant, MD  ?sertraline (ZOLOFT) 50 MG tablet Take 50 mg by mouth daily.   Yes Provider, Historical,

## 2021-10-22 NOTE — Assessment & Plan Note (Addendum)
Suspect due to hypovolemia.  Patient is afebrile, no leukocytosis, family deny recent signs or symptoms of infection.  Started on broad-spectrum antibiotics on admission. ?-- Stop antibiotics and monitor clinically for signs or symptoms of infection ? ?Patient remained clinically stable with out signs of infection. ?

## 2021-10-22 NOTE — Progress Notes (Signed)
Anticoagulation monitoring(Lovenox): ? ?88yo  F ordered Lovenox 40 mg Q24h ?   ?Filed Weights  ? 10/22/21 1814  ?Weight: 54.4 kg (120 lb)  ? ?BMI 20  ? ?Lab Results  ?Component Value Date  ? CREATININE 1.19 (H) 10/22/2021  ? CREATININE 0.56 08/22/2021  ? CREATININE 0.80 08/21/2021  ? ?Estimated Creatinine Clearance: 22.7 mL/min (A) (by C-G formula based on SCr of 1.19 mg/dL (H)). ?Hemoglobin & Hematocrit  ?   ?Component Value Date/Time  ? HGB 16.2 (H) 10/22/2021 1813  ? HCT 52.0 (H) 10/22/2021 1813  ? ? ? ?Per Protocol for Patient with estCrcl < 30 ml/min and BMI < 30, will transition to Lovenox 30 mg Q24h  ?  ?  ?Chinita Greenland PharmD ?Clinical Pharmacist ?10/22/2021 ? ?

## 2021-10-22 NOTE — Assessment & Plan Note (Addendum)
Continue statin. 

## 2021-10-22 NOTE — Assessment & Plan Note (Addendum)
POA, now resolved.   ?Suspect due to DKA. ?Zofran as needed. ?

## 2021-10-22 NOTE — ED Provider Notes (Signed)
? ?[facility] ?Provider Note ? ? ? Event Date/Time  ? First MD Initiated Contact with Patient 10/22/21 1812   ?  (approximate) ? ? ?History  ? ?Chief Complaint ?Altered Mental Status (N/v x 3 days) ? ? ?HPI ? ?Angela Patterson is a 88 y.o. female with past medical history of hypertension, diabetes, stroke, paroxysmal atrial fibrillation, and dementia who presents to the ED for nausea and vomiting.  History is limited due to patient's dementia and majority of history is obtained from EMS.  They state that patient has been dealing with persistent nausea and vomiting for the past 3 days.  They have been attempting to treat this with Zofran at her nursing facility without success.  She has not had any diarrhea and has not complained of any abdominal pain.  They do state that patient has seemed more confused than her baseline, patient currently oriented only to self.  Patient is unable to answer any questions. ?  ? ? ?Physical Exam  ? ?Triage Vital Signs: ?ED Triage Vitals  ?Enc Vitals Group  ?   BP   ?   Pulse   ?   Resp   ?   Temp   ?   Temp src   ?   SpO2   ?   Weight   ?   Height   ?   Head Circumference   ?   Peak Flow   ?   Pain Score   ?   Pain Loc   ?   Pain Edu?   ?   Excl. in [place]?   ? ? ?Most recent vital signs: ?Vitals:  ? 10/22/21 1817 10/22/21 2100  ?BP: (!) 160/61 (!) 150/103  ?Pulse: (!) 102 (!) 55  ?Resp: (!) 22 15  ?Temp: 97.8 ?F (36.6 ?C)   ?SpO2: 96% 94%  ? ? ?Constitutional: Alert and oriented to person, but not place, time, or situation. ?Eyes: Conjunctivae are normal.  Pupils equal, round, and reactive to light bilaterally. ?Head: Atraumatic. ?Nose: No congestion/rhinnorhea. ?Mouth/Throat: Mucous membranes are dry. ?Cardiovascular: Tachycardic, irregularly irregular rhythm. Grossly normal heart sounds.  2+ radial pulses bilaterally. ?Respiratory: Normal respiratory effort.  No retractions. Lungs CTAB. ?Gastrointestinal: Soft and nontender. No distention. ?Musculoskeletal: No lower  extremity tenderness nor edema.  ?Neurologic: Slurred speech noted. No gross focal neurologic deficits are appreciated. ? ? ? ?ED Results / Procedures / Treatments  ? ?Labs ?(all labs ordered are listed, but only abnormal results are displayed) ?Labs Reviewed  ?CBC WITH DIFFERENTIAL/PLATELET - Abnormal; Notable for the following components:  ?    Result Value  ? WBC 14.2 (*)   ? RBC 5.52 (*)   ? Hemoglobin 16.2 (*)   ? HCT 52.0 (*)   ? Neutro Abs 11.2 (*)   ? Abs Immature Granulocytes 0.10 (*)   ? All other components within normal limits  ?COMPREHENSIVE METABOLIC PANEL - Abnormal; Notable for the following components:  ? Sodium 149 (*)   ? Glucose, Bld 287 (*)   ? BUN 56 (*)   ? Creatinine, Ser 1.19 (*)   ? Total Bilirubin 1.7 (*)   ? GFR, Estimated 41 (*)   ? Anion gap 17 (*)   ? All other components within normal limits  ?URINALYSIS, ROUTINE W REFLEX MICROSCOPIC - Abnormal; Notable for the following components:  ? Color, Urine YELLOW (*)   ? APPearance HAZY (*)   ? Glucose, UA >=500 (*)   ? Ketones, ur 20 (*)   ?  Bacteria, UA RARE (*)   ? All other components within normal limits  ?LIPASE, BLOOD  ?MAGNESIUM  ?BASIC METABOLIC PANEL  ?BASIC METABOLIC PANEL  ?BASIC METABOLIC PANEL  ?BASIC METABOLIC PANEL  ?OSMOLALITY  ?HEMOGLOBIN A1C  ?LACTIC ACID, PLASMA  ?LACTIC ACID, PLASMA  ?BASIC METABOLIC PANEL  ?CBC  ?BRAIN NATRIURETIC PEPTIDE  ?TROPONIN I (HIGH SENSITIVITY)  ? ? ? ?EKG ? ?ED ECG REPORT ?Tempie Hoist, the attending physician, personally viewed and interpreted this ECG. ? ? Date: 10/22/2021 ? EKG Time: 18:25 ? Rate: 94 ? Rhythm: atrial fibrillation ? Axis: Normal ? Intervals:right bundle branch block ? ST&T Change: None ? ?RADIOLOGY ?Chest x-ray reviewed by me with no infiltrate, edema, or effusion.  CT head reviewed by me with no hemorrhage or midline shift. ? ?PROCEDURES: ? ?Critical Care performed: No ? ?.1-3 Lead EKG Interpretation ?Performed by: Blake Divine, MD ?Authorized by: Blake Divine, MD   ? ?  Interpretation: abnormal   ?  ECG rate:  100-120 ?  ECG rate assessment: tachycardic   ?  Rhythm: atrial fibrillation   ?  Ectopy: none   ?  Conduction: normal   ? ? ?MEDICATIONS ORDERED IN ED: ?Medications  ?clopidogrel (PLAVIX) tablet 75 mg (has no administration in time range)  ?insulin regular, human (MYXREDLIN) 100 units/ 100 mL infusion (has no administration in time range)  ?lactated ringers infusion (has no administration in time range)  ?dextrose 5 % in lactated ringers infusion (has no administration in time range)  ?dextrose 50 % solution 0-50 mL (has no administration in time range)  ?potassium chloride 10 mEq in 100 mL IVPB (has no administration in time range)  ?acetaminophen (TYLENOL) tablet 1,000 mg (has no administration in time range)  ?  Or  ?acetaminophen (TYLENOL) suppository 650 mg (has no administration in time range)  ?ondansetron (ZOFRAN) tablet 4 mg (has no administration in time range)  ?  Or  ?ondansetron (ZOFRAN) injection 4 mg (has no administration in time range)  ?enoxaparin (LOVENOX) injection 30 mg (has no administration in time range)  ?metoprolol tartrate (LOPRESSOR) injection 5 mg (has no administration in time range)  ?lactated ringers bolus 1,000 mL (0 mLs Intravenous Stopped 10/22/21 2028)  ?lactated ringers bolus 500 mL (500 mLs Intravenous New Bag/Given 10/22/21 2040)  ? ? ? ?IMPRESSION / MDM / ASSESSMENT AND PLAN / ED COURSE  ?I reviewed the triage vital signs and the nursing notes. ?             ?               ? ?88 y.o. female with past medical history of hypertension, diabetes, stroke, paroxysmal atrial fibrillation, and dementia who presents to the ED for 3 days of persistent nausea and vomiting now with worsening of her mental status. ? ?Differential diagnosis includes, but is not limited to, stroke, intracranial hemorrhage, sepsis, UTI, dehydration, electrolyte abnormality, pneumonia, aspiration. ? ?Patient awake and alert on arrival but is only oriented to self,  does not appear to have any focal neurologic deficits.  She does appear to be significantly confused and less responsive from her baseline per family, noted to be in atrial fibrillation with RVR.  She appears quite dehydrated, was hydrated with IV fluids without significant improvement in heart rate and we will treat heart rate with IV metoprolol.  CT head is negative for acute process and chest x-ray is unremarkable, CT of abdomen/pelvis shows distended urinary bladder with hydroureter tracking upwards.  Given AKI  with likely dehydration and obstructive cause, Foley catheter was placed.  UA does not appear consistent with infection.  Labs also concerning for hyponatremia, likely secondary to dehydration.  CBC with elevated hemoglobin and leukocytosis, consistent with hemoconcentration.  Patient given a total of 1.5 L of IV fluids, heart rate improving with IV metoprolol.  Given ongoing altered mental status with A-fib RVR, case discussed with hospitalist for admission. ? ?The patient is on the cardiac monitor to evaluate for evidence of arrhythmia and/or significant heart rate changes. ? ?  ? ? ?FINAL CLINICAL IMPRESSION(S) / ED DIAGNOSES  ? ?Final diagnoses:  ?Altered mental status, unspecified altered mental status type  ?Urinary retention  ?AKI (acute kidney injury) ([place])  ?Atrial fibrillation with RVR ([place])  ? ? ? ?Rx / DC Orders  ? ?ED Discharge Orders   ? ? None  ? ?  ? ? ? ?Note:  This document was prepared using Dragon voice recognition software and may include unintentional dictation errors. ?  ?Blake Divine, MD ?10/22/21 2122 ? ?

## 2021-10-22 NOTE — ED Triage Notes (Signed)
Pt BIBA for n/x 3 days. Pt also AMS ?

## 2021-10-22 NOTE — Assessment & Plan Note (Addendum)
Presented with tachypnea, leukocytosis. ?UA and chest x-ray negative for infection.  Suspect SIRS due to DKA. ?--Stop antibiotics and monitor clinically ?-- Follow-up blood cultures, NGTD ?-- Off IV fluids ?

## 2021-10-22 NOTE — Sepsis Progress Note (Signed)
Monitoring for the code sepsis protocol. °

## 2021-10-23 DIAGNOSIS — G9341 Metabolic encephalopathy: Secondary | ICD-10-CM | POA: Diagnosis not present

## 2021-10-23 DIAGNOSIS — E1110 Type 2 diabetes mellitus with ketoacidosis without coma: Secondary | ICD-10-CM | POA: Diagnosis not present

## 2021-10-23 DIAGNOSIS — E870 Hyperosmolality and hypernatremia: Secondary | ICD-10-CM

## 2021-10-23 HISTORY — DX: Hyperosmolality and hypernatremia: E87.0

## 2021-10-23 LAB — GLUCOSE, CAPILLARY
Glucose-Capillary: 106 mg/dL — ABNORMAL HIGH (ref 70–99)
Glucose-Capillary: 139 mg/dL — ABNORMAL HIGH (ref 70–99)
Glucose-Capillary: 140 mg/dL — ABNORMAL HIGH (ref 70–99)
Glucose-Capillary: 141 mg/dL — ABNORMAL HIGH (ref 70–99)
Glucose-Capillary: 142 mg/dL — ABNORMAL HIGH (ref 70–99)
Glucose-Capillary: 144 mg/dL — ABNORMAL HIGH (ref 70–99)
Glucose-Capillary: 146 mg/dL — ABNORMAL HIGH (ref 70–99)
Glucose-Capillary: 158 mg/dL — ABNORMAL HIGH (ref 70–99)
Glucose-Capillary: 172 mg/dL — ABNORMAL HIGH (ref 70–99)
Glucose-Capillary: 177 mg/dL — ABNORMAL HIGH (ref 70–99)
Glucose-Capillary: 207 mg/dL — ABNORMAL HIGH (ref 70–99)

## 2021-10-23 LAB — BASIC METABOLIC PANEL
Anion gap: 11 (ref 5–15)
Anion gap: 13 (ref 5–15)
Anion gap: 16 — ABNORMAL HIGH (ref 5–15)
Anion gap: 9 (ref 5–15)
BUN: 29 mg/dL — ABNORMAL HIGH (ref 8–23)
BUN: 36 mg/dL — ABNORMAL HIGH (ref 8–23)
BUN: 42 mg/dL — ABNORMAL HIGH (ref 8–23)
BUN: 47 mg/dL — ABNORMAL HIGH (ref 8–23)
CO2: 20 mmol/L — ABNORMAL LOW (ref 22–32)
CO2: 23 mmol/L (ref 22–32)
CO2: 23 mmol/L (ref 22–32)
CO2: 24 mmol/L (ref 22–32)
Calcium: 8.5 mg/dL — ABNORMAL LOW (ref 8.9–10.3)
Calcium: 8.9 mg/dL (ref 8.9–10.3)
Calcium: 8.9 mg/dL (ref 8.9–10.3)
Calcium: 9.6 mg/dL (ref 8.9–10.3)
Chloride: 107 mmol/L (ref 98–111)
Chloride: 110 mmol/L (ref 98–111)
Chloride: 112 mmol/L — ABNORMAL HIGH (ref 98–111)
Chloride: 113 mmol/L — ABNORMAL HIGH (ref 98–111)
Creatinine, Ser: 0.68 mg/dL (ref 0.44–1.00)
Creatinine, Ser: 0.69 mg/dL (ref 0.44–1.00)
Creatinine, Ser: 0.84 mg/dL (ref 0.44–1.00)
Creatinine, Ser: 0.93 mg/dL (ref 0.44–1.00)
GFR, Estimated: 56 mL/min — ABNORMAL LOW (ref >60)
GFR, Estimated: >60 mL/min (ref >60)
GFR, Estimated: >60 mL/min (ref >60)
GFR, Estimated: >60 mL/min (ref >60)
Glucose, Bld: 154 mg/dL — ABNORMAL HIGH (ref 70–99)
Glucose, Bld: 189 mg/dL — ABNORMAL HIGH (ref 70–99)
Glucose, Bld: 200 mg/dL — ABNORMAL HIGH (ref 70–99)
Glucose, Bld: 211 mg/dL — ABNORMAL HIGH (ref 70–99)
Potassium: 3.3 mmol/L — ABNORMAL LOW (ref 3.5–5.1)
Potassium: 3.4 mmol/L — ABNORMAL LOW (ref 3.5–5.1)
Potassium: 3.6 mmol/L (ref 3.5–5.1)
Potassium: 3.8 mmol/L (ref 3.5–5.1)
Sodium: 143 mmol/L (ref 135–145)
Sodium: 143 mmol/L (ref 135–145)
Sodium: 147 mmol/L — ABNORMAL HIGH (ref 135–145)
Sodium: 148 mmol/L — ABNORMAL HIGH (ref 135–145)

## 2021-10-23 LAB — COMPREHENSIVE METABOLIC PANEL
ALT: 14 U/L (ref 0–44)
AST: 21 U/L (ref 15–41)
Albumin: 2.9 g/dL — ABNORMAL LOW (ref 3.5–5.0)
Alkaline Phosphatase: 52 U/L (ref 38–126)
Anion gap: 8 (ref 5–15)
BUN: 36 mg/dL — ABNORMAL HIGH (ref 8–23)
CO2: 25 mmol/L (ref 22–32)
Calcium: 8.7 mg/dL — ABNORMAL LOW (ref 8.9–10.3)
Chloride: 111 mmol/L (ref 98–111)
Creatinine, Ser: 0.74 mg/dL (ref 0.44–1.00)
GFR, Estimated: >60 mL/min (ref >60)
Glucose, Bld: 163 mg/dL — ABNORMAL HIGH (ref 70–99)
Potassium: 3.2 mmol/L — ABNORMAL LOW (ref 3.5–5.1)
Sodium: 144 mmol/L (ref 135–145)
Total Bilirubin: 1.1 mg/dL (ref 0.3–1.2)
Total Protein: 6.3 g/dL — ABNORMAL LOW (ref 6.5–8.1)

## 2021-10-23 LAB — TROPONIN I (HIGH SENSITIVITY): Troponin I (High Sensitivity): 16 ng/L (ref <18)

## 2021-10-23 LAB — OSMOLALITY: Osmolality: 346 mOsm/kg (ref 275–295)

## 2021-10-23 LAB — LACTIC ACID, PLASMA
Lactic Acid, Venous: 1.7 mmol/L (ref 0.5–1.9)
Lactic Acid, Venous: 2.1 mmol/L (ref 0.5–1.9)

## 2021-10-23 LAB — BRAIN NATRIURETIC PEPTIDE: B Natriuretic Peptide: 173.4 pg/mL — ABNORMAL HIGH (ref 0.0–100.0)

## 2021-10-23 MED ORDER — INSULIN ASPART 100 UNIT/ML IJ SOLN
0.0000 [IU] | Freq: Three times a day (TID) | INTRAMUSCULAR | Status: DC
  Administered 2021-10-23 (×2): 2 [IU] via SUBCUTANEOUS
  Administered 2021-10-23: 3 [IU] via SUBCUTANEOUS
  Filled 2021-10-23 (×2): qty 1

## 2021-10-23 MED ORDER — VANCOMYCIN HCL 750 MG/150ML IV SOLN: 750.0000 mg | INTRAVENOUS | Status: DC

## 2021-10-23 MED ORDER — INSULIN ASPART 100 UNIT/ML IJ SOLN
0.0000 [IU] | INTRAMUSCULAR | Status: DC
  Administered 2021-10-23 (×2): 1 [IU] via SUBCUTANEOUS
  Administered 2021-10-24: 7 [IU] via SUBCUTANEOUS
  Administered 2021-10-24: 2 [IU] via SUBCUTANEOUS
  Administered 2021-10-24: 1 [IU] via SUBCUTANEOUS
  Administered 2021-10-25 (×2): 2 [IU] via SUBCUTANEOUS
  Administered 2021-10-25: 3 [IU] via SUBCUTANEOUS
  Filled 2021-10-23 (×8): qty 1

## 2021-10-23 MED ORDER — POTASSIUM CL IN DEXTROSE 5% 20 MEQ/L IV SOLN
20.0000 meq | INTRAVENOUS | Status: AC
  Administered 2021-10-23 (×2): 20 meq via INTRAVENOUS
  Filled 2021-10-23 (×2): qty 1000

## 2021-10-23 MED ORDER — DEXTROSE 5 % IV SOLN: INTRAVENOUS | Status: DC

## 2021-10-23 MED ORDER — INSULIN GLARGINE-YFGN 100 UNIT/ML ~~LOC~~ SOLN
5.0000 [IU] | Freq: Every day | SUBCUTANEOUS | Status: DC
  Administered 2021-10-23: 5 [IU] via SUBCUTANEOUS
  Filled 2021-10-23: qty 0.05

## 2021-10-23 MED ORDER — DILTIAZEM HCL-DEXTROSE 125-5 MG/125ML-% IV SOLN (PREMIX)
5.0000 mg/h | INTRAVENOUS | Status: AC
  Administered 2021-10-23: 10 mg/h via INTRAVENOUS
  Administered 2021-10-23: 5 mg/h via INTRAVENOUS
  Administered 2021-10-23: 7.5 mg/h via INTRAVENOUS
  Administered 2021-10-24: 5 mg/h via INTRAVENOUS
  Filled 2021-10-23 (×2): qty 125

## 2021-10-23 MED ORDER — SODIUM CHLORIDE 0.9 % IV SOLN: 2.0000 g | INTRAVENOUS | Status: DC

## 2021-10-23 MED ORDER — VANCOMYCIN HCL IN DEXTROSE 1-5 GM/200ML-% IV SOLN
1000.0000 mg | Freq: Once | INTRAVENOUS | Status: AC
  Administered 2021-10-23: 1000 mg via INTRAVENOUS
  Filled 2021-10-23: qty 200

## 2021-10-23 NOTE — Progress Notes (Signed)
SLP Cancellation Note ? ?Patient Details ?Name: Angela Patterson ?MRN: [id number] ?DOB: [birth year] ? ? ?Cancelled treatment:       Reason Eval/Treat Not Completed: Medical issues which prohibited therapy;Fatigue/lethargy limiting ability to participate  ? ?SLP consult received and appreciated. Chart review completed. Daughters at bedside. Daughters note pt with hx of dysphagia. Pt uses a volume-limiting cup similar to [place] at facility. Pt unable to rouse long enough for safe participation in clinical swallowing evaluation despite max verbal and tactile cues. Recommend continuation of NPO pending improvements in LOA.  ? ?SLP to f/u this PM to determine appropriateness for clinical swallowing evaluation. ? ?Pt's daughters and RN made aware of the above. Daughters verbalized understanding/agreement. ? ?Time spent with pt/family: 1015-1030 ? ?Cherrie Gauze, M.S., CCC-SLP ?Speech-Language Pathologist ?[facility] ?[phone] ([place])  ? ?Angela Patterson ?10/23/2021, 10:42 AM ?

## 2021-10-23 NOTE — Assessment & Plan Note (Addendum)
Presented with DKA, suspected due to [place].  Stop Jardiance.  Now on subcutaneous insulin.   ? ?CONTINUE: ?-- Semglee 9 units daily ?-- Novolog 3 units TID + Sliding Scale 0-9 units as follows: ? ?Take 0-9 units 3 times daily with meals as follows: ?If CBG 70-120: 0 units ?If CBG 121-150: 1 unit ?If CBG 151-200: 2 units ?If CBG 201-250: 3 units ?If CBG 251-300: 5 units ?If CBG 301-350: 7 units ?If CBG 351-400: 9 units ?If CBG over 400: 12 units and call MD  ? ?

## 2021-10-23 NOTE — Progress Notes (Signed)
Inpatient Diabetes Program Recommendations ? ?AACE/ADA: New Consensus Statement on Inpatient Glycemic Control (2015) ? ?Target Ranges:  Prepandial:   less than 140 mg/dL ?     Peak postprandial:   less than 180 mg/dL (1-2 hours) ?     Critically ill patients:  140 - 180 mg/dL  ? ?Lab Results  ?Component Value Date  ? GLUCAP 158 (H) 10/23/2021  ? HGBA1C 10.0 (H) 08/21/2021  ? ? ?Review of Glycemic Control ? ?Diabetes history: DM 2 ?Outpatient Diabetes medications: Jardiance 25 mg Daily ?Current orders for Inpatient glycemic control:  ?Semglee 5 units ?Novolog 0-9 units tid ? ?A1c 10% on 2/2 ? ?Inpatient Diabetes Program Recommendations:   ? ?Note: Pt is on an SGLT-2, Jardiance, at home which could precipitate DKA. Would recommend d/c prior to discharge and have prescriber follow up with pt. Pt may need insulin at Facility as A1c was elevated in February. ? ?Thanks, ? ?Tama Headings RN, MSN, BC-ADM ?Inpatient Diabetes Coordinator ?Team Pager [phone] (8a-5p) ? ? ? ?

## 2021-10-23 NOTE — Assessment & Plan Note (Addendum)
Resolved.  Presented with sodium 149.  Likely due to dehydration in the setting of DKA and free water deficit.    Monitor BMP. ?

## 2021-10-23 NOTE — Assessment & Plan Note (Signed)
On statin.

## 2021-10-23 NOTE — Assessment & Plan Note (Addendum)
With DKA, went into with A-fib RVR requiring Cardizem drip.   ?Not on anticoagulation presumed due to falls.  Is on Plavix.   ? ?Echocardiogram 4/7: EF 60 to 65% without LV regional wall motion abnormalities, indeterminate diastolic parameters, mild to moderate MR, moderate TR ? ?BP tolerating long-acting Cardizem and rates overall well controlled, elevated at times. ?-- Cardizem CD 240 mg daily ?

## 2021-10-23 NOTE — Assessment & Plan Note (Signed)
On Plavix and statin ?

## 2021-10-23 NOTE — Progress Notes (Signed)
? ?      CROSS COVER NOTE ? ?NAME: Angela Patterson ?MRN: [id number] ?DOB : 1920/10/30 ? ? ?Notified by nursing they received ENDOTOOL alert that patient is to be transitioned off of insulin infusion. ? ?Chart reviewed. Anion gap is closed and CBG has normalized. ? ?Patient not on scheduled basal insulin at home - will place on lower limit 0.1 units per kg of basal/bolus insulin therapy due to age and BMI of 16, adjust as necessary.  This will include 5U of basal insulin daily and SSI with meals. ? ?Insulin infusion to be stopped 2 hrs after administration of basal insulin administration.    ? ?Initiating accuchecks QAC and QHS. ? ?Neomia Glass MHA, MSN, FNP-BC ?Nurse Practitioner ?[facility] ?[place] ?Pager [phone] ? ? ? ?

## 2021-10-23 NOTE — Progress Notes (Signed)
?Progress Note ? ? ?Patient: Angela Patterson NLG:[id number] DOB: 06/19/1921 DOA: 10/22/2021     1 ?DOS: the patient was seen and examined on 10/23/2021 ?  ?Brief hospital course: ?[name] is a 88 year old female with history of paroxysmal atrial fibrillation, hypertension, history of CVA, dementia, non-insulin-dependent diabetes mellitus, who presents emergency department for chief concerns of nausea and vomiting and altered mental status.  Patient takes Jardiance for her diabetes. ? ?ED course: Afebrile, RR 22, HR 102, BP 160/61 and improved to 150/103, spO2 of 96% on room air.  Labs notable for hypernatremia 149, BUN 56, creatinine 1.19, glucose 287, GFR 41, WBC 14.2, hemoconcentration with Hbg 16.2.  Anion gap elevated at 17, total bili 1.7, urinalysis with ketonuria but no evidence of infection. ? ?Patient admitted with DKA likely secondary to [facility].  Started on insulin infusion.  Once DKA resolved, patient transitioned to subcutaneous insulin.  Angela Patterson is held and will be discontinued at discharge. ? ?Patient developed A-fib with RVR requiring Cardizem drip.  Blood pressures are soft but MAP stable, continue monitoring patient in ICU for now. ? ? ?Assessment and Plan: ?* DKA (diabetic ketoacidosis) ([place]) ?Mild, possibly due to Jardiance, patient has never been on insulin.  Patient presented with anion gap metabolic acidosis, ketonuria.  Resolved with insulin infusion. ?Transitioned to subcutaneous insulin. ?Continue IV fluids ? ?Hypernatremia ?Present with sodium 149.  Likely due to dehydration in the setting of DKA and free water deficit.  Sodium level this afternoon normalized 143.  Monitor BMP. ? ?AKI (acute kidney injury) ([place]) ?Presented with creatinine 1.19, mild AKI. ?Prerenal etiology in the setting of dehydration related to DKA.  Creatinine normalized with IV fluids.  Monitor BMP. ? ?Nausea and vomiting ?Suspect due to DKA. ?Zofran as needed ? ?SIRS due to infectious process with acute organ  dysfunction ([place]) ?Presented with tachypnea, leukocytosis. ?UA and chest x-ray negative for infection.  Suspect SIRS due to DKA. ?--Stop antibiotics and monitor clinically ?-- Follow-up blood cultures ?-- Continue IV fluids ? ?Leukocytosis ?Suspect due to hypovolemia.  Patient is afebrile, no leukocytosis, family deny recent signs or symptoms of infection.  Started on broad-spectrum antibiotics on admission. ?-- Stop antibiotics and monitor clinically for signs or symptoms of infection ? ?Paroxysmal atrial fibrillation ([facility]) ?Now with A-fib RVR requiring Cardizem drip. ?Not on anticoagulation presumed due to falls.  Is on Plavix. ?-- Continue Cardizem drip ?-- Monitor on telemetry ?-- Monitor replace electrolytes ?-- Consider cardiology consult if worsening or persistent ? ?Acute metabolic encephalopathy ?Likely due to DKA.  Patient lethargic, only opens eyes briefly.  Family report baseline mild dementia with no sundowning type behaviors.  No focal neurologic deficits. ?--Delirium precautions ?-- Manage underlying conditions as outlined ? ?Anxiety ?Continue home sertraline ? ?Type 2 diabetes mellitus ([place]) ?Presented with DKA, suspected due to [place].  Stop Jardiance.  Now on subcutaneous insulin.  Will discuss for change in therapy with family.  Monitor CBGs.  Sliding scale NovoLog. ? ?Pure hypercholesterolemia ?On statin ? ?Essential hypertension ?Hold amlodipine with soft blood pressures on Cardizem drip ? ?History of CVA (cerebrovascular accident) ?On Plavix and statin ? ?Hypercholesterolemia ?Continue statin ? ? ? ? ?  ? ?Subjective: Patient is somnolent and difficult to arouse, only opens eyes just briefly, does not interact.  Seen later this morning with 2 daughters at bedside and remains somnolent.  They report that she was up and awake all night.  No acute events reported.  She did go into A-fib RVR  today. ? ?Physical Exam: ?Vitals:  ? 10/23/21 1600 10/23/21 1630 10/23/21 1900 10/23/21 2000  ?BP: (!)  89/55 103/67 (!) 83/57 (!) 114/57  ?Pulse: 92 (!) 117 74 83  ?Resp: 18 15 (!) 27 15  ?Temp: 97.6 ?F (36.4 ?C)  98.8 ?F (37.1 ?C)   ?TempSrc: Axillary  Oral   ?SpO2: 96% 95% 96% 95%  ?Weight:      ?Height:      ? ?General exam: Somnolent, difficult to arouse, no acute distress, frail ?HEENT: Eyes closed, dry mucus membranes  ?Respiratory system: CTAB, no wheezes, rales or rhonchi, normal respiratory effort. ?Cardiovascular system: normal S1/S2, RRR, no pedal edema.   ?Gastrointestinal system: soft, NT, ND, no HSM felt, +bowel sounds. ?Central nervous system: Unable to evaluate due to somnolence ?Extremities: no edema, normal tone ?Skin: dry, intact, normal temperature ?Psychiatry: n unable to assess due to somnolence ? ? ?Data Reviewed: ? ?Labs reviewed notable for potassium 3.3, glucose 200, BUN 36, creatinine improved to 0.69, calcium 8.5.  CBGs later today 177, 207 ? ?Family Communication: 2 daughters at bedside this morning ? ?Disposition: ?Status is: Inpatient ?Remains inpatient appropriate because: Severity of illness on IV therapy as outlined above ? ? Planned Discharge Destination:  SNF versus return to ALF with home health pending PT and OT evaluations. ? ? ? ?Time spent: 45 minutes ? ?Author: ?Ezekiel Slocumb, DO ?10/23/2021 8:08 PM ? ?For on call review [email or web address].  ?

## 2021-10-24 ENCOUNTER — Inpatient Hospital Stay (HOSPITAL_COMMUNITY)
Admit: 2021-10-24 | Discharge: 2021-10-24 | Disposition: A | Discharge status: Home / Self Care | Payer: Medicare Other | Attending: Internal Medicine | Admitting: Internal Medicine

## 2021-10-24 DIAGNOSIS — E1110 Type 2 diabetes mellitus with ketoacidosis without coma: Secondary | ICD-10-CM | POA: Diagnosis not present

## 2021-10-24 DIAGNOSIS — E43 Unspecified severe protein-calorie malnutrition: Secondary | ICD-10-CM | POA: Diagnosis not present

## 2021-10-24 DIAGNOSIS — I4891 Unspecified atrial fibrillation: Secondary | ICD-10-CM

## 2021-10-24 DIAGNOSIS — I480 Paroxysmal atrial fibrillation: Secondary | ICD-10-CM | POA: Diagnosis not present

## 2021-10-24 DIAGNOSIS — I10 Essential (primary) hypertension: Secondary | ICD-10-CM | POA: Diagnosis not present

## 2021-10-24 LAB — PHOSPHORUS: Phosphorus: 2.6 mg/dL (ref 2.5–4.6)

## 2021-10-24 LAB — GLUCOSE, CAPILLARY
Glucose-Capillary: 122 mg/dL — ABNORMAL HIGH (ref 70–99)
Glucose-Capillary: 180 mg/dL — ABNORMAL HIGH (ref 70–99)
Glucose-Capillary: 187 mg/dL — ABNORMAL HIGH (ref 70–99)
Glucose-Capillary: 318 mg/dL — ABNORMAL HIGH (ref 70–99)
Glucose-Capillary: 93 mg/dL (ref 70–99)
Glucose-Capillary: 95 mg/dL (ref 70–99)

## 2021-10-24 LAB — BASIC METABOLIC PANEL
Anion gap: 6 (ref 5–15)
BUN: 21 mg/dL (ref 8–23)
CO2: 26 mmol/L (ref 22–32)
Calcium: 8.5 mg/dL — ABNORMAL LOW (ref 8.9–10.3)
Chloride: 107 mmol/L (ref 98–111)
Creatinine, Ser: 0.67 mg/dL (ref 0.44–1.00)
GFR, Estimated: >60 mL/min (ref >60)
Glucose, Bld: 120 mg/dL — ABNORMAL HIGH (ref 70–99)
Potassium: 3.4 mmol/L — ABNORMAL LOW (ref 3.5–5.1)
Sodium: 139 mmol/L (ref 135–145)

## 2021-10-24 LAB — ECHOCARDIOGRAM COMPLETE
AR max vel: 2.5 cm2
AV Area VTI: 2.66 cm2
AV Area mean vel: 2.74 cm2
AV Mean grad: 1.5 mmHg
AV Peak grad: 2.5 mmHg
Ao pk vel: 0.8 m/s
Area-P 1/2: 3.79 cm2
Height: 64 in
MV VTI: 2.03 cm2
S' Lateral: 1.6 cm
Weight: 1527.35 oz

## 2021-10-24 LAB — HEMOGLOBIN A1C
Hgb A1c MFr Bld: 10.5 % — ABNORMAL HIGH (ref 4.8–5.6)
Hgb A1c MFr Bld: 10.7 % — ABNORMAL HIGH (ref 4.8–5.6)
Mean Plasma Glucose: 255 mg/dL
Mean Plasma Glucose: 260 mg/dL

## 2021-10-24 LAB — MAGNESIUM: Magnesium: 1.9 mg/dL (ref 1.7–2.4)

## 2021-10-24 MED ORDER — LACTATED RINGERS IV SOLN: INTRAVENOUS | Status: DC

## 2021-10-24 MED ORDER — DILTIAZEM HCL 30 MG PO TABS
30.0000 mg | ORAL_TABLET | Freq: Four times a day (QID) | ORAL | Status: DC
  Administered 2021-10-24 – 2021-10-25 (×4): 30 mg via ORAL
  Filled 2021-10-24 (×4): qty 1

## 2021-10-24 MED ORDER — NYSTATIN 100000 UNIT/ML MT SUSP
5.0000 mL | Freq: Four times a day (QID) | OROMUCOSAL | Status: DC
  Administered 2021-10-24 – 2021-10-29 (×16): 500000 [IU] via ORAL
  Filled 2021-10-24 (×16): qty 5

## 2021-10-24 MED ORDER — POTASSIUM CHLORIDE 10 MEQ/100ML IV SOLN
10.0000 meq | INTRAVENOUS | Status: AC
  Administered 2021-10-24 (×3): 10 meq via INTRAVENOUS
  Filled 2021-10-24 (×3): qty 100

## 2021-10-24 MED ORDER — GLUCERNA SHAKE PO LIQD
237.0000 mL | Freq: Three times a day (TID) | ORAL | Status: DC
Start: 2021-10-24 — End: 2021-10-30
  Administered 2021-10-24 – 2021-10-30 (×15): 237 mL via ORAL

## 2021-10-24 MED ORDER — ADULT MULTIVITAMIN W/MINERALS CH
1.0000 | ORAL_TABLET | Freq: Every day | ORAL | Status: DC
  Administered 2021-10-25 – 2021-10-30 (×6): 1 via ORAL
  Filled 2021-10-24 (×6): qty 1

## 2021-10-24 NOTE — Progress Notes (Signed)
Cross Cover ?Nurse question regarding need for fluid orders.Prior fluid orders dicontinued with discontinuation of the dka protocol. Patient starte on LR at 100 ?

## 2021-10-24 NOTE — Progress Notes (Addendum)
Initial Nutrition Assessment ? ?DOCUMENTATION CODES:  ? ?Severe malnutrition in context of social or environmental circumstances ? ?INTERVENTION:  ? ?Glucerna Shake po TID, each supplement provides 220 kcal and 10 grams of protein ? ?MVI po daily  ? ?Daily weights  ? ?Pt at high refeed risk; recommend monitor potassium, magnesium and phosphorus labs daily until stable ? ?NUTRITION DIAGNOSIS:  ? ?Severe Malnutrition related to social / environmental circumstances (advanced age) as evidenced by severe fat depletion, severe muscle depletion. ? ?GOAL:  ? ?Patient will meet greater than or equal to 90% of their needs ? ?MONITOR:  ? ?PO intake, Supplement acceptance, Labs, Weight trends, Skin, I & O's ? ?REASON FOR ASSESSMENT:  ? ?Other (Comment) (Low BMI) ?  ? ?ASSESSMENT:  ? ?88 year old female with history of paroxysmal atrial fibrillation, hypertension, CVA, dementia, non-insulin-dependent diabetes mellitus, HOH, anxiety and HLD who is admitted with DKA. ? ?Met with pt in room today. Pt is HOH and is a poor historian so unable to provide much meaningful nutrition related history. Pt does report poor oral intake at baseline and reports that she is not hungry today. Pt seen by SLP and initiated on a dysphagia 1/thin liquid diet. Pt reports that she drinks Ensure at home. RD will add supplements and MVI to help pt meet her estimated needs. Pt is at high refeed risk. Per chart, pt is down 7lbs(7%) over the past 2 months; this is significant weight loss.  ? ?Medications reviewed and include: plavix, insulin, lovenox, LRS _0 /hr, KCl ? ?Labs reviewed: K 3.4(L), P 2.6 wnl, Mg 1.9 wnl  ?Wbc- 14.2(H) ?Cbgs- 95, 122 x 24 hrs ?AIC 10.5(H)- 4/6  ? ?NUTRITION - FOCUSED PHYSICAL EXAM: ? ?Flowsheet Row Most Recent Value  ?Orbital Region Severe depletion  ?Upper Arm Region Moderate depletion  ?Thoracic and Lumbar Region Severe depletion  ?Buccal Region Moderate depletion  ?Temple Region Severe depletion  ?Clavicle Bone Region  Severe depletion  ?Clavicle and Acromion Bone Region Severe depletion  ?Scapular Bone Region Severe depletion  ?Dorsal Hand Severe depletion  ?Patellar Region Severe depletion  ?Anterior Thigh Region Severe depletion  ?Posterior Calf Region Severe depletion  ?Edema (RD Assessment) None  ?Hair Reviewed  ?Eyes Reviewed  ?Mouth Reviewed  ?Skin Reviewed  ?Nails Reviewed  ? ?Diet Order:   ?Diet Order   ? ?       ?  DIET - DYS 1 Room service appropriate? Yes with Assist; Fluid consistency: Thin  Diet effective now       ?  ? ?  ?  ? ?  ? ?EDUCATION NEEDS:  ? ?Not appropriate for education at this time ? ?Skin:  Skin Assessment: Reviewed RN Assessment (PI right foot, ecchymosis) ? ?Last BM:  PTA ? ?Height:  ? ?Ht Readings from Last 1 Encounters:  ?10/22/21 _1  (1.626 m)  ? ? ?Weight:  ? ?Wt Readings from Last 1 Encounters:  ?10/22/21 43.3 kg  ? ? ?Ideal Body Weight:  54.5 kg ? ?BMI:  Body mass index is 16.39 kg/m?. ? ?Estimated Nutritional Needs:  ? ?Kcal:  1200-1400kcal/day ? ?Protein:  60-70g/day ? ?Fluid:  1.1-1.3L/day ? ?Koleen Distance MS, RD, LDN ?Please refer to [facility] for RD and/or RD on-call/weekend/after hours pager ? ?

## 2021-10-24 NOTE — Assessment & Plan Note (Addendum)
Related to advanced age, mild dementia, social/environmental circumstances, as evidenced by severe fat and muscle depletion. ?-- Dietitian following, appreciate recommendations ?-- Started on Glucerna shakes, MVI ?--Daily weights ? ?Body mass index is 16.39 kg/m?Marland Kitchen ? ?

## 2021-10-24 NOTE — Progress Notes (Signed)
*  PRELIMINARY RESULTS* ?Echocardiogram ?2D Echocardiogram has been performed. ? ?Celicia Minahan, [place] ?10/24/2021, 2:36 PM ?

## 2021-10-24 NOTE — Evaluation (Signed)
Physical Therapy Evaluation ?Patient Details ?Name: Angela Patterson ?MRN: [id number] ?DOB: [birth year] ?Today's Date: 10/24/2021 ? ?History of Present Illness ? 88 year old female with history of paroxysmal atrial fibrillation, hypertension, history of CVA, dementia, non-insulin-dependent diabetes mellitus, who presents emergency department for chief concerns of nausea and vomiting and altered mental status. ?  ?Clinical Impression ? Patient alert, agreeable to PT with education, very HOH with mild improvement hearing aides. Family at bedside provided PLOF; recently transitioned to [facility], and had progressed to the point where she was walking with a walker. Family was providing assistance as able, ALF assists with some ADLs, meals, med management, and cleaning. Pt/family reported no other falls since previous hospital admission.   ? ?The patient was able to perform supine to sit with HOB elevated and reliance on bed rails, CGA with extended time. She did reported dizziness, BP 117/97, HR 117. Dizziness mildly improved with time. Sit <> Stand ultimately minA, pt puts forth great effort but needs minA for steadying and PT stabilization of RW to pull on. She was able to take a few steps to recliner in room, somewhat impulsive, and requiring minA with RW for safety.  Overall the patient demonstrated deficits (see "PT Problem List") that impede the patient's functional abilities, safety, and mobility and would benefit from skilled PT intervention. Recommendation at this time is SNF pending further patient progress with mobility due to current level of assistance needed.  ?   ? ?Recommendations for follow up therapy are one component of a multi-disciplinary discharge planning process, led by the attending physician.  Recommendations may be updated based on patient status, additional functional criteria and insurance authorization. ? ?Follow Up Recommendations Skilled nursing-short term rehab (<3 hours/day) ? ?   ?Assistance Recommended at Discharge    ?Patient can return home with the following ? A lot of help with walking and/or transfers;A lot of help with bathing/dressing/bathroom;Assist for transportation;Direct supervision/assist for financial management;Assistance with cooking/housework;Help with stairs or ramp for entrance;Direct supervision/assist for medications management ? ?  ?Equipment Recommendations None recommended by PT  ?Recommendations for Other Services ?    ?  ?Functional Status Assessment Patient has had a recent decline in their functional status and demonstrates the ability to make significant improvements in function in a reasonable and predictable amount of time.  ? ?  ?Precautions / Restrictions Precautions ?Precautions: Fall ?Restrictions ?Weight Bearing Restrictions: No  ? ?  ? ?Mobility ? Bed Mobility ?Overal bed mobility: Needs Assistance ?Bed Mobility: Supine to Sit ?  ?  ?Supine to sit: Min guard, HOB elevated ?  ?  ?General bed mobility comments: reliant on bed rails to come up into sitting ?  ? ?Transfers ?Overall transfer level: Needs assistance ?Equipment used: Rolling walker (2 wheels) ?Transfers: Sit to/from Stand, Bed to chair/wheelchair/BSC ?Sit to Stand: Min assist ?Stand pivot transfers: Min assist ?  ?  ?  ?  ?General transfer comment: impulsive, and quick to finish the transfer, minA for steadying and RW management ?  ? ?Ambulation/Gait ?  ?  ?  ?  ?  ?  ?  ?  ? ?Stairs ?  ?  ?  ?  ?  ? ?Wheelchair Mobility ?  ? ?Modified Rankin (Stroke Patients Only) ?  ? ?  ? ?Balance Overall balance assessment: Needs assistance ?Sitting-balance support: Feet supported, Bilateral upper extremity supported ?Sitting balance-Leahy Scale: Fair ?Sitting balance - Comments: min guard ?  ?Standing balance support: Reliant on assistive device for balance,  During functional activity, Bilateral upper extremity supported ?Standing balance-Leahy Scale: Poor ?  ?  ?  ?  ?  ?  ?  ?  ?  ?  ?  ?  ?    ? ? ? ?Pertinent Vitals/Pain Pain Assessment ?Pain Assessment: Faces ?Faces Pain Scale: No hurt  ? ? ?Home Living Family/patient expects to be discharged to:: Assisted living ?  ?Available Help at Discharge: Family;Available PRN/intermittently ?Type of Home: Assisted living ?  ?  ?  ?  ?  ?Home Equipment: Other (comment);Tub bench;BSC/3in1 (3 wheeled walker) ?   ?  ?Prior Function Prior Level of Function : Needs assist ?  ?  ?  ?Physical Assist : ADLs (physical) ?  ?ADLs (physical): Bathing;Grooming;Dressing;IADLs ?Mobility Comments: Per daughter, pt with recent transition to [facility]. Had progressed enough recently to be able to walk to the bathroom and back with a RW. ALF assists with some ADLs/meals/meds/cleaning ?  ?  ? ? ?Hand Dominance  ? Dominant Hand: Right ? ?  ?Extremity/Trunk Assessment  ? Upper Extremity Assessment ?Upper Extremity Assessment: Generalized weakness ?  ? ?Lower Extremity Assessment ?Lower Extremity Assessment: Generalized weakness ?  ? ?Cervical / Trunk Assessment ?Cervical / Trunk Assessment: Kyphotic  ?Communication  ? Communication: HOH  ?Cognition Arousal/Alertness: Awake/alert ?Behavior During Therapy: [facility] for tasks assessed/performed ?Overall Cognitive Status: Within Functional Limits for tasks assessed ?  ?  ?  ?  ?  ?  ?  ?  ?  ?  ?  ?  ?  ?  ?  ?  ?General Comments: Pt is pleasant and cooperative. Slow to process but also very HOH. She follows commands ?  ?  ? ?  ?General Comments   ? ?  ?Exercises    ? ?Assessment/Plan  ?  ?PT Assessment Patient needs continued PT services  ?PT Problem List Decreased strength;Decreased mobility;Decreased activity tolerance;Decreased balance;Decreased knowledge of use of DME ? ?   ?  ?PT Treatment Interventions DME instruction;Therapeutic exercise;Gait training;Balance training;Stair training;Neuromuscular re-education;Functional mobility training;Therapeutic activities;Patient/family education   ? ?PT Goals (Current goals can be found in  the Care Plan section)  ?Acute Rehab PT Goals ?Patient Stated Goal: to move better ?PT Goal Formulation: With patient/family ?Time For Goal Achievement: 11/07/21 ?Potential to Achieve Goals: Good ? ?  ?Frequency Min 2X/week ?  ? ? ?Co-evaluation   ?  ?  ?  ?  ? ? ?  ?AM-PAC PT "6 Clicks" Mobility  ?Outcome Measure Help needed turning from your back to your side while in a flat bed without using bedrails?: A Little ?Help needed moving from lying on your back to sitting on the side of a flat bed without using bedrails?: A Lot ?Help needed moving to and from a bed to a chair (including a wheelchair)?: A Little ?Help needed standing up from a chair using your arms (e.g., wheelchair or bedside chair)?: A Little ?Help needed to walk in hospital room?: A Lot ?Help needed climbing 3-5 steps with a railing? : Total ?6 Click Score: 14 ? ?  ?End of Session Equipment Utilized During Treatment: Gait belt ?Activity Tolerance: Patient tolerated treatment well;Patient limited by fatigue ?Patient left: in chair;with chair alarm set;with call bell/phone within reach;with nursing/sitter in room;with family/visitor present ?Nurse Communication: Mobility status ?PT Visit Diagnosis: Other abnormalities of gait and mobility (R26.89);Difficulty in walking, not elsewhere classified (R26.2);Muscle weakness (generalized) (M62.81) ?  ? ?Time: 2355-7322 ?PT Time Calculation (min) (ACUTE ONLY): 28 min ? ? ?Charges:  PT Evaluation ?$PT Eval Low Complexity: 1 Low ?PT Treatments ?$Therapeutic Activity: 23-37 mins ?  ?   ? ?Lieutenant Diego PT, DPT ?3:45 PM,10/24/21 ? ?

## 2021-10-24 NOTE — Evaluation (Signed)
Occupational Therapy Evaluation ?Patient Details ?Name: Angela Patterson ?MRN: [id number] ?DOB: 1920-09-07 ?Today's Date: 10/24/2021 ? ? ?History of Present Illness 88 year old female with history of paroxysmal atrial fibrillation, hypertension, history of CVA, dementia, non-insulin-dependent diabetes mellitus, who presents emergency department for chief concerns of nausea and vomiting and altered mental status.  ? ?Clinical Impression ?  ?Patient presenting with decreased Ind in self care, balance, functional mobility/transfers, endurance, and safety awareness. Patient reports living at home with assist from daughter and niece as needed. She ambulates with three wheeled walker. Family assists with IADLs and occasional ADLs. Patient needing min A for bed mobility. She stands with mod A and takes several side steps to the R with RW and min A. Pt fatigues very quickly but is motivated to participate. Pt returns to bed and needing increased assistance secondary to fatigue. Patient will benefit from acute OT to increase overall independence in the areas of ADLs, functional mobility,and safety awareness in order to safely discharge to next venue of care.  ?   ? ?Recommendations for follow up therapy are one component of a multi-disciplinary discharge planning process, led by the attending physician.  Recommendations may be updated based on patient status, additional functional criteria and insurance authorization.  ? ?Follow Up Recommendations ? Skilled nursing-short term rehab (<3 hours/day)  ?  ?Assistance Recommended at Discharge Frequent or constant Supervision/Assistance  ?Patient can return home with the following A lot of help with walking and/or transfers;A lot of help with bathing/dressing/bathroom;Assistance with cooking/housework;Help with stairs or ramp for entrance;Assist for transportation ? ?  ?Functional Status Assessment ? Patient has had a recent decline in their functional status and demonstrates the ability  to make significant improvements in function in a reasonable and predictable amount of time.  ?Equipment Recommendations ? Other (comment) (defer to next venue of care)  ?  ?   ?Precautions / Restrictions Precautions ?Precautions: Fall ?Restrictions ?Weight Bearing Restrictions: No  ? ?  ? ?Mobility Bed Mobility ?Overal bed mobility: Needs Assistance ?Bed Mobility: Supine to Sit, Sit to Supine ?  ?  ?Supine to sit: Min guard ?Sit to supine: Mod assist ?  ?General bed mobility comments: more assist needed when returning to bed secondary to fatigue ?  ? ?Transfers ?Overall transfer level: Needs assistance ?Equipment used: Rolling walker (2 wheels) ?Transfers: Sit to/from Stand ?Sit to Stand: Mod assist ?  ?  ?  ?  ?  ?  ?  ? ?  ?Balance Overall balance assessment: Needs assistance ?Sitting-balance support: Feet supported, Bilateral upper extremity supported ?Sitting balance-Leahy Scale: Fair ?Sitting balance - Comments: min guard ?  ?Standing balance support: Reliant on assistive device for balance, During functional activity, Bilateral upper extremity supported ?Standing balance-Leahy Scale: Poor ?  ?  ?  ?  ?  ?  ?  ?  ?  ?  ?  ?  ?   ? ?ADL either performed or assessed with clinical judgement  ? ?ADL Overall ADL's : Needs assistance/impaired ?  ?  ?Grooming: Wash/dry hands;Wash/dry face;Min guard;Sitting ?  ?  ?  ?  ?  ?  ?  ?  ?  ?  ?  ?  ?  ?  ?  ?  ?   ? ? ? ?Vision Baseline Vision/History: 1 Wears glasses ?Ability to See in Adequate Light: 0 Adequate ?Patient Visual Report: No change from baseline ?   ?   ?   ?   ? ?Pertinent Vitals/Pain Pain  Assessment ?Pain Assessment: Faces ?Faces Pain Scale: No hurt  ? ? ? ?Hand Dominance Right ?  ?Extremity/Trunk Assessment Upper Extremity Assessment ?Upper Extremity Assessment: Generalized weakness ?  ?Lower Extremity Assessment ?Lower Extremity Assessment: Generalized weakness ?  ?  ?  ?Communication Communication ?Communication: HOH ?  ?Cognition Arousal/Alertness:  Awake/alert ?Behavior During Therapy: [facility] for tasks assessed/performed ?Overall Cognitive Status: Within Functional Limits for tasks assessed ?  ?  ?  ?  ?  ?  ?  ?  ?  ?  ?  ?  ?  ?  ?  ?  ?General Comments: Pt is pleasant and cooperative. Slow to process but also very HOH. She follows commands ?  ?  ?   ?   ?   ? ? ?Home Living Family/patient expects to be discharged to:: Private residence ?Living Arrangements: Alone ?Available Help at Discharge: Family;Available PRN/intermittently ?Type of Home: House ?Home Access: Stairs to enter ?Entrance Stairs-Number of Steps: 2 ?Entrance Stairs-Rails: Right ?Home Layout: One level ?  ?  ?Bathroom Shower/Tub: Tub/shower unit ?  ?Bathroom Toilet: Handicapped height ?Bathroom Accessibility: No ?  ?Home Equipment: Other (comment);Tub bench;BSC/3in1 (3 wheeled walker) ?  ?  ?  ? ?  ?Prior Functioning/Environment Prior Level of Function : Needs assist ?  ?  ?  ?  ?  ?  ?  ?ADLs Comments: daughter and niece assist with shower and pt can dress herself ?  ? ?  ?  ?OT Problem List: Decreased strength;Decreased activity tolerance;Impaired balance (sitting and/or standing);Decreased safety awareness ?  ?   ?OT Treatment/Interventions: Self-care/ADL training;Therapeutic exercise;Therapeutic activities;Energy conservation;DME and/or AE instruction;Manual therapy;Balance training;Patient/family education  ?  ?OT Goals(Current goals can be found in the care plan section) Acute Rehab OT Goals ?Patient Stated Goal: to get stronger ?OT Goal Formulation: With patient ?Time For Goal Achievement: 11/07/21 ?Potential to Achieve Goals: Good ?ADL Goals ?Pt Will Perform Grooming: with supervision;standing ?Pt Will Perform Lower Body Dressing: with supervision;sit to/from stand ?Pt Will Transfer to Toilet: with supervision;ambulating ?Pt Will Perform Toileting - Clothing Manipulation and hygiene: with supervision;sit to/from stand  ?OT Frequency: Min 2X/week ?  ? ?   ?AM-PAC OT "6 Clicks" Daily  Activity     ?Outcome Measure Help from another person eating meals?: None ?Help from another person taking care of personal grooming?: A Little ?Help from another person toileting, which includes using toliet, bedpan, or urinal?: A Lot ?Help from another person bathing (including washing, rinsing, drying)?: A Lot ?Help from another person to put on and taking off regular upper body clothing?: A Little ?Help from another person to put on and taking off regular lower body clothing?: A Lot ?6 Click Score: 16 ?  ?End of Session Equipment Utilized During Treatment: Rolling walker (2 wheels) ?Nurse Communication: Mobility status ? ?Activity Tolerance: Patient limited by fatigue ?Patient left: in bed ? ?OT Visit Diagnosis: Unsteadiness on feet (R26.81);Muscle weakness (generalized) (M62.81)  ?              ?Time: 2202-5427 ?OT Time Calculation (min): 19 min ?Charges:  OT General Charges ?$OT Visit: 1 Visit ?OT Evaluation ?$OT Eval Moderate Complexity: 1 Mod ?OT Treatments ?$Therapeutic Activity: 8-22 mins ? ?Darleen Crocker, [place], OTR/L , CBIS ?ascom [phone]  ?10/24/21, 12:26 PM  ?

## 2021-10-24 NOTE — Progress Notes (Signed)
?  ?  Palliative Medicine Team ? ? ? ?Palliative Medicine consult noted. Due to high referral volume, there may be a delay seeing this patient. Please call the Palliative Medicine Team office at [phone] if recommendations are needed in the interim. A provider will be available on site 10/27/2021. If patient is expected to discharge before this date, please make outpatient palliative care referral as appropriate.  ?  ? ?  ? ?Thank you for inviting [place] to participate in the care of this patient. ?  ?Damian Leavell, MSN, RN ?Palliative Medicine Team  ?

## 2021-10-24 NOTE — Progress Notes (Addendum)
?Progress Note ? ? ?Patient: Angela Patterson GPQ:[id number] DOB: 1921/01/24 DOA: 10/22/2021     3 ?DOS: the patient was seen and examined on 10/25/2021 ?  ?Brief hospital course: ?[name] is a 88 year old female with history of paroxysmal atrial fibrillation, hypertension, history of CVA, dementia, non-insulin-dependent diabetes mellitus, who presents emergency department for chief concerns of nausea and vomiting and altered mental status.  Patient takes Jardiance for her diabetes. ? ?ED course: Afebrile, RR 22, HR 102, BP 160/61 and improved to 150/103, spO2 of 96% on room air.  Labs notable for hypernatremia 149, BUN 56, creatinine 1.19, glucose 287, GFR 41, WBC 14.2, hemoconcentration with Hbg 16.2.  Anion gap elevated at 17, total bili 1.7, urinalysis with ketonuria but no evidence of infection. ? ?Patient admitted with DKA likely secondary to [facility].  Started on insulin infusion.  Once DKA resolved, patient transitioned to subcutaneous insulin.  [name] is held and will be discontinued at discharge. ? ?Patient developed A-fib with RVR requiring Cardizem drip.  Blood pressures are soft but MAP stable, continued monitoring patient in ICU another day. ?Started on short-acting oral Cardizem 4/7 with reasonable HR control (variable HR 52-110), BP's stable. ? ? ?Assessment and Plan: ?* DKA (diabetic ketoacidosis) ([place]) ?Resolved.  Mild, possibly due to Jardiance, patient has never been on insulin.  Patient presented with anion gap metabolic acidosis, ketonuria.   ?Resolved with insulin infusion. ?Transitioned to subcutaneous insulin. ? ?Protein-calorie malnutrition, severe ?Related to advanced age, mild dementia, social/environmental circumstances, as evidenced by severe fat and muscle depletion. ?-- Dietitian following, appreciate recommendations ?-- Started on Glucerna shakes, MVI ?--Daily weights ?-- Monitor for refeeding syndrome ?Body mass index is 16.39 kg/m?. ? ? ?Hypernatremia ?Resolved.  Presented with  sodium 149.  Likely due to dehydration in the setting of DKA and free water deficit.    Monitor BMP. ? ?AKI (acute kidney injury) ([place]) ?Presented with creatinine 1.19, mild AKI. ?Prerenal etiology in the setting of dehydration related to DKA.  Creatinine normalized with IV fluids.  Monitor BMP. ? ?Creatinine today 0.67 ? ?Nausea and vomiting ?POA, now resolved.  Suspect due to DKA. ?Zofran as needed ? ?SIRS due to infectious process with acute organ dysfunction ([place]) ?Presented with tachypnea, leukocytosis. ?UA and chest x-ray negative for infection.  Suspect SIRS due to DKA. ?--Stop antibiotics and monitor clinically ?-- Follow-up blood cultures ?-- Continue IV fluids ? ?Leukocytosis ?Suspect due to hypovolemia.  Patient is afebrile, no leukocytosis, family deny recent signs or symptoms of infection.  Started on broad-spectrum antibiotics on admission. ?-- Stop antibiotics and monitor clinically for signs or symptoms of infection ? ?Paroxysmal atrial fibrillation ([facility]) ?Now with A-fib RVR requiring Cardizem drip. ?Not on anticoagulation presumed due to falls.  Is on Plavix.  Heart rates well controlled on Cardizem drip, intermittent spikes to 110's seen on monitor. ?--Transition to short acting oral Cardizem 30 mg q6 ?--Stop Cardizem drip about 1 hour after oral given ?-- Monitor on telemetry ?-- Monitor replace electrolytes ?-- Echo pending ? ?Acute metabolic encephalopathy ?POA, appears resolved.  Likely due to DKA.  Yesterday, patient was lethargic, only opened eyes briefly.  Family report baseline mild dementia with no sundowning type behaviors.  No focal neurologic deficits.  This morning, patient sitting up, awake, talkative able to express needs.  Working with SLP. ?Appears at her baseline mental status. ?--Delirium precautions ?-- Manage underlying conditions as outlined ? ?Anxiety ?Continue home sertraline ? ?Type 2 diabetes mellitus ([place]) ?Presented with DKA, suspected  due to [place].  Stop Jardiance.   Now on subcutaneous insulin.  Will discuss for change in therapy with family.  Monitor CBGs.  Sliding scale NovoLog. ? ?Pure hypercholesterolemia ?On statin ? ?Essential hypertension ?Hold amlodipine with soft blood pressures on Cardizem drip.  We will transition to oral Cardizem today. ? ?History of CVA (cerebrovascular accident) ?On Plavix and statin ? ?Hypercholesterolemia ?Continue statin ? ? ? ? ?  ? ?Subjective: Patient seen sitting up awake in bed working with SLP in ICU this morning.  She is awake and alert, talkative.  Mental status seems to be back at her baseline today.  She denies having any pain or feeling sick.  She denies pain in her mouth or throat (has some ?thrush).  Daughters not yet at bedside during my encounter. ? ?Physical Exam: ?Vitals:  ? 10/24/21 2350 10/25/21 0349 10/25/21 0426 10/25/21 0734  ?BP: 110/76 119/86  (!) 139/92  ?Pulse: (!) 52 (!) 110  (!) 101  ?Resp: '17 16  18  '$ ?Temp: (!) 97.5 ?F (36.4 ?C) 97.9 ?F (36.6 ?C)  97.9 ?F (36.6 ?C)  ?TempSrc:  Axillary    ?SpO2: 97% 98%  97%  ?Weight:   46.2 kg   ?Height:      ? ?General exam: awake, alert, no acute distress, frail ?HEENT: moist mucus membranes, hard of hearing  ?Respiratory system: CTAB, no wheezes, rales or rhonchi, normal respiratory effort. ?Cardiovascular system: normal S1/S2, irregular rhythm, regular rate, no pedal edema.   ?Gastrointestinal system: soft, nontender abdomen. ?Central nervous system: Alert, oriented to self, no gross focal neurologic deficits, normal speech ?Extremities: moves all, patchy ecchymosis on bilateral upper extremities, normal tone ?Skin: dry, intact, normal temperature ?Psychiatry: normal mood, congruent affect, judgement and insight appear normal ? ? ?Data Reviewed: ? ?Labs reviewed and notable for potassium 3.4, glucose 120, calcium 8.5.  CBGs 95, 187. ?Echocardiogram is pending ? ?Family Communication: 2 daughters were at bedside on rounds yesterday.  They had not yet arrived when patient seen  this morning.  Will attempt to update this afternoon. ? ?Disposition: ?Status is: Inpatient ?Remains inpatient appropriate because: Severity of illness still on Cardizem drip for A-fib RVR.  Anticipate discharge in 24 to 48 hours pending further clinical improvement and PT/OT evaluations. ? ? Planned Discharge Destination:  ALF with home health versus SNF ? ? ? ?Time spent: 40 minutes ? ?Author: ?Ezekiel Slocumb, DO ?10/25/2021 9:26 AM ? ?For on call review [email or web address].  ?

## 2021-10-24 NOTE — Evaluation (Signed)
Clinical/Bedside Swallow Evaluation ?Patient Details  ?Name: Angela Patterson ?MRN: [id number] ?Date of Birth: 05-15-1921 ? ?Today's Date: 10/24/2021 ?Time: SLP Start Time (ACUTE ONLY): 5400 SLP Stop Time (ACUTE ONLY): 0950 ?SLP Time Calculation (min) (ACUTE ONLY): 60 min ? ?Past Medical History:  ?Past Medical History:  ?Diagnosis Date  ? Allergy   ? Chicken pox   ? CVA (cerebral vascular accident) [facility])   ? Diabetes mellitus ([place])   ? Hypercholesterolemia   ? Hypertension   ? ?Past Surgical History:  ?Past Surgical History:  ?Procedure Laterality Date  ? ABDOMINAL HYSTERECTOMY  1971  ? excessive bleeding  ? APPENDECTOMY  age 27  ? BREAST LUMPECTOMY  1958  ? benign  ? [place] OF UTERUS  1970  ? ?HPI:  ?Pt is a 88 year old female with history of Multiple medical issues including Dementia, dysphagia - uses a dysphagia drink cup w/ thin liquids for controlled flow, DKA, paroxysmal atrial fibrillation, hypertension, CVA, non-insulin-dependent diabetes mellitus, HOH, anxiety, severe malnutrition and HLD who is admitted with DKA.  ?  ?Assessment / Plan / Recommendation  ?Clinical Impression ? Pt seen for BSE this morning. Pt presented w/ full alertness, verbal but HOH. She followed basic instructions w/ Min-Mod cues. Pt has Baseline Dementia, dysphagia and uses a Provale dysphagia drink cup at Baseline at her facility.  ? ?Pt appears to present w/ min, generalized oropharyngeal phase dysphagia in light of declined Cognitive status, Baseline Dementia and in light of advanced age in setting of acute illness. Any Cognitive decline can impact overall awareness/timing of swallow and safety during po tasks which increases risk for aspiration, choking. Pt's generalized dysphagia and risk for aspiration is present, but it appears reduced when following general aspiration precautions and using a modified diet consistency (puree) w/ her Provale dysphagia drink cup as has been recommended and is her Baseline. She required  Min+ verbal/visual cues for during po tasks.     ? ?Pt consumed several trials of ice chips, purees, and thin liquids w/ No overt clinical s/s of aspiration noted w/ the exception of a throat clearing/cough x1 post a trial of thin liquid -- no further noted at trials continued. No continued cough, no decline in vocal quality; and no decline in respiratory status during/post trials -- O2 sats remained 98% throughout. Oral phase was adequate for bolus management and oral clearing of the boluses given; timely bolus acceptance, A-P transfer, and swallowing to clear. Pt attempted self-feeding self by holding her own Cup but required feeding support of foods; suspect impact from the current illness and Cognitive decline overall. Holding own Cup when drinking improves safety of swallowing.  ?OM Exam appeared [facility] w/ No unilateral weakness noted. Some confusion of OM tasks and oral care.   ? ?D/t pt's Baseline, declined Cognitive status and her risk for aspiration currently in setting of acute illness/hospitalization/advanced age, recommend initiation of the dysphagia level 1(puree) w/ Thin liquids via using her Provale dysphagia drink cup; general aspiration precautions; reduce Distractions during meals and engage pt during po's at meal for self-feeding. Pills Crushed in Puree for safer swallowing. Support w/ feeding at meals as needed. MD/NSG updated.  ?ST services will monitor diet tolerance while admitted and recommends follow w/ Palliative Care for [place] and education re: impact of Cognitive decline/Dementia on swallowing. ST services can follow pt at discharge to her facility for further education as needed -- pt appears close to/at her Baseline from a dysphagia standpoint. Precautions posted in room.  ?  SLP Visit Diagnosis: Dysphagia, oropharyngeal phase (R13.12) (suspect related to baseline Cognitive decline/Dementia) ?   ?Aspiration Risk ? Mild aspiration risk;Risk for inadequate nutrition/hydration (reduced following  general aspiration precautions)  ?  ?Diet Recommendation   dysphagia level 1(puree) w/ Thin liquids via using her Provale dysphagia drink cup; general aspiration precautions; reduce Distractions during meals and engage pt during po's at meal for self-feeding. Support w/ feeding at meals as needed.  ? ?Medication Administration: Crushed with puree  ?  ?Other  Recommendations Recommended Consults:  (Palliative Care f/u; Dietician following) ?Oral Care Recommendations: Oral care BID;Oral care before and after PO;Staff/trained caregiver to provide oral care ?Other Recommendations:  (n/a)   ? ?Recommendations for follow up therapy are one component of a multi-disciplinary discharge planning process, led by the attending physician.  Recommendations may be updated based on patient status, additional functional criteria and insurance authorization. ? ?Follow up Recommendations Follow physician's recommendations for discharge plan and follow up therapies (at d/c to facility)  ? ? ?  ?Assistance Recommended at Discharge Frequent or constant Supervision/Assistance  ?Functional Status Assessment Patient has had a recent decline in their functional status and demonstrates the ability to make significant improvements in function in a reasonable and predictable amount of time.  ?Frequency and Duration min 1 x/week  ?1 week ?  ?   ? ?Prognosis Prognosis for Safe Diet Advancement: Fair ?Barriers to Reach Goals: Cognitive deficits;Time post onset;Severity of deficits ?Barriers/Prognosis Comment: baseline Dementia; uses a dysphagia drink cup  ? ?  ? ?Swallow Study   ?General Date of Onset: 10/22/21 ?HPI: Pt is a 88 year old female with history of Multiple medical issues including Dementia, dysphagia - uses a dysphagia drink cup w/ thin liquids for controlled flow, DKA, paroxysmal atrial fibrillation, hypertension, CVA, non-insulin-dependent diabetes mellitus, HOH, anxiety, severe malnutrition and HLD who is admitted with DKA. ?Type of  Study: Bedside Swallow Evaluation ?Previous Swallow Assessment: none ?Diet Prior to this Study: NPO ?Temperature Spikes Noted: No ?Respiratory Status: Room air ?History of Recent Intubation: No ?Behavior/Cognition: Alert;Cooperative;Pleasant mood;Confused;Distractible;Requires cueing (HOH) ?Oral Cavity Assessment: Dry (sticky) ?Oral Care Completed by SLP: Yes ?Oral Cavity - Dentition: Adequate natural dentition ?Vision: Functional for self-feeding (to hold own cup to drink) ?Self-Feeding Abilities: Able to feed self;Needs assist;Needs set up;Total assist (can hold own cup to drink) ?Patient Positioning: Upright in bed (needed positioning support) ?Baseline Vocal Quality: Normal ?Volitional Cough: Cognitively unable to elicit ?Volitional Swallow: Unable to elicit  ?  ?Oral/Motor/Sensory Function Overall Oral Motor/Sensory Function: Within functional limits   ?Ice Chips Ice chips: Within functional limits ?Presentation: Spoon (fed; 3 trials)   ?Thin Liquid Thin Liquid: Within functional limits ?Presentation: Cup;Self Fed (provale dysphagia drink cup)  ?  ?Nectar Thick Nectar Thick Liquid: Not tested   ?Honey Thick Honey Thick Liquid: Not tested   ?Puree Puree: Within functional limits ?Presentation: Spoon (fed; 15+ trials)   ?Solid ? ? ?  Solid: Not tested  ? ?  ? ? ? ? ? ? ?Orinda Kenner, MS, CCC-SLP ?Speech Language Pathologist ?Rehab Services; [place] ?[phone] (ascom) ?Lorence Nagengast ?10/24/2021,12:02 PM ? ? ? ?

## 2021-10-25 DIAGNOSIS — I10 Essential (primary) hypertension: Secondary | ICD-10-CM | POA: Diagnosis not present

## 2021-10-25 DIAGNOSIS — I480 Paroxysmal atrial fibrillation: Secondary | ICD-10-CM | POA: Diagnosis not present

## 2021-10-25 DIAGNOSIS — E1110 Type 2 diabetes mellitus with ketoacidosis without coma: Secondary | ICD-10-CM | POA: Diagnosis not present

## 2021-10-25 LAB — BASIC METABOLIC PANEL
Anion gap: 7 (ref 5–15)
BUN: 17 mg/dL (ref 8–23)
CO2: 26 mmol/L (ref 22–32)
Calcium: 8.6 mg/dL — ABNORMAL LOW (ref 8.9–10.3)
Chloride: 106 mmol/L (ref 98–111)
Creatinine, Ser: 0.63 mg/dL (ref 0.44–1.00)
GFR, Estimated: >60 mL/min (ref >60)
Glucose, Bld: 153 mg/dL — ABNORMAL HIGH (ref 70–99)
Potassium: 4 mmol/L (ref 3.5–5.1)
Sodium: 139 mmol/L (ref 135–145)

## 2021-10-25 LAB — GLUCOSE, CAPILLARY
Glucose-Capillary: 159 mg/dL — ABNORMAL HIGH (ref 70–99)
Glucose-Capillary: 153 mg/dL — ABNORMAL HIGH (ref 70–99)
Glucose-Capillary: 246 mg/dL — ABNORMAL HIGH (ref 70–99)
Glucose-Capillary: 227 mg/dL — ABNORMAL HIGH (ref 70–99)
Glucose-Capillary: 370 mg/dL — ABNORMAL HIGH (ref 70–99)

## 2021-10-25 LAB — MAGNESIUM: Magnesium: 1.9 mg/dL (ref 1.7–2.4)

## 2021-10-25 MED ORDER — INSULIN ASPART 100 UNIT/ML IJ SOLN
0.0000 [IU] | Freq: Three times a day (TID) | INTRAMUSCULAR | Status: DC
  Administered 2021-10-25 – 2021-10-26 (×2): 3 [IU] via SUBCUTANEOUS
  Administered 2021-10-26 (×2): 5 [IU] via SUBCUTANEOUS
  Administered 2021-10-27: 2 [IU] via SUBCUTANEOUS
  Administered 2021-10-27: 5 [IU] via SUBCUTANEOUS
  Administered 2021-10-27: 7 [IU] via SUBCUTANEOUS
  Administered 2021-10-28: 6 [IU] via SUBCUTANEOUS
  Administered 2021-10-28 – 2021-10-29 (×2): 1 [IU] via SUBCUTANEOUS
  Administered 2021-10-29: 5 [IU] via SUBCUTANEOUS
  Administered 2021-10-29: 6 [IU] via SUBCUTANEOUS
  Administered 2021-10-30: 5 [IU] via SUBCUTANEOUS
  Administered 2021-10-30: 3 [IU] via SUBCUTANEOUS
  Filled 2021-10-25 (×14): qty 1

## 2021-10-25 MED ORDER — DILTIAZEM HCL ER COATED BEADS 180 MG PO CP24
180.0000 mg | ORAL_CAPSULE | Freq: Every day | ORAL | Status: DC
  Administered 2021-10-25: 180 mg via ORAL
  Filled 2021-10-25: qty 1

## 2021-10-25 NOTE — Progress Notes (Signed)
Mobility Specialist - Progress Note ? ? 10/25/21 1612  ?Mobility  ?Activity Contraindicated/medical hold  ? ? ? ?Pt sleeping upon arrival. RN requested not to bother pt at this time d/t stomach issues earlier this date. Will attempt at another date and time. ? ?Merrily Brittle ?Mobility Specialist ?10/25/21, 4:13 PM ? ? ? ? ?

## 2021-10-25 NOTE — Progress Notes (Signed)
Speech Language Pathology Treatment: Dysphagia  ?Patient Details ?Name: Angela Patterson ?MRN: [id number] ?DOB: [birth year] ?Today's Date: 10/25/2021 ?Time: 1950-9326 ?SLP Time Calculation (min) (ACUTE ONLY): 60 min ? ?Assessment / Plan / Recommendation ?Clinical Impression ? Pt seen this morning for ongoing assessment of swallowing; trials to upgrade food consistency in pt's diet. Pt presented w/ alertness, verbal, but HOH and seemed min fatigued. She followed basic instructions w/ Min-Mod cues. Pt has Baseline Dementia, dysphagia and uses a Provale dysphagia drink cup at Baseline at her facility.  ?  ?Pt appears to present w/ min, generalized oropharyngeal phase dysphagia in setting of declined Cognitive status, Baseline Dementia and in light of advanced age w/ ongoing acute illness and hospitalization. Any Cognitive decline and illness in the advanced ages can impact overall awareness/timing of swallow and safety during po tasks which increases risk for aspiration, choking. Pt's generalized dysphagia and risk for aspiration is present, but it appears reduced when following general aspiration precautions and using a modified diet consistency (puree) w/ her Provale dysphagia drink cup as has been recommended and is her Baseline. She required Min+ verbal/visual cues for during po tasks.  Pt is HOH and easily fatigued w/ tasks.  ?  ?Pt consumed several trials of purees and thin liquids w/ No overt clinical s/s of aspiration noted; No cough, no decline in vocal quality; and no decline in respiratory status during/post trials -- O2 sats remained 98% throughout. Pt utilized single sips not overfilling her mouth w/ large sips. Oral phase was adequate for bolus management and oral clearing of the boluses given; timely bolus acceptance, A-P transfer, and swallowing to clear. Pt self-fed by holding her own Cup and used utensil to feed self foods. Holding own Cup when drinking improves safety of swallowing.  ? ?OF NOTE: trials(4) of  a more minced/mech soft consistency were trialed w/ pt -- oral phase deficits noted c/b increased mastication effort/time and min disorganized prep/mastication of the increased textured trials. She eventually transferred and cleared the trials, but the work appeared effortful, and pt's Stamina is reduced at this time secondary to acute illness.  ?Daughter later reported "it takes Mom an hour to eat her meals at [place]" baseline. Using an increased textured diet currently would cause concern for pt to be able to meet nutrition needs.  She is eating ~50%+ of the pureed consistency currently, then an Ensure.  ?  ?D/t pt's Baseline, declined Cognitive status w/ risk for aspiration currently in setting of acute illness/hospitalization/advanced age, and her decreased STAMINA w/ tasks, recommend continue the dysphagia level 1(puree) w/ Thin liquids via using her Provale dysphagia drink cup; general aspiration precautions; reduce Distractions during meals and engage pt during po's at meal for self-feeding. Pills Crushed in Puree for safer swallowing. Support w/ feeding at meals as needed. MD/NSG updated.  ?Pt has Palliative Care ongoing- recommend continued education re: impact of Cognitive decline/Dementia on swallowing and oral intake in general. ST services can follow pt at discharge at her facility for further upgrade of diet when the time is appropriate(when she has improved Stamina for tasks) and for education as needed -- pt appears close to/at her Baseline from a dysphagia standpoint. Precautions posted in room.  ? ?TC w/ Daughter after session included: updated on pt's progress in the past 2-3 days; education on general aspiration precautions w/ the inclusion of using her Dysphagia drink Cup but also providing another Dysphagia drink Cup for just WATER for hydration -- she cannot change out the liquids  herself; foods and diet consistency; food prep and upgrade into a more Minced foods diet in next 2-3 weeks post  acute illness and when Stamina has increased; recs to flavor foods w/ condiments. Daughter agreed. Handouts left for the Daughter - given to Warren in room.  ? ? ?  ?HPI HPI: Pt is a 88 year old female with history of Multiple medical issues including Dementia, dysphagia - uses a dysphagia drink cup w/ thin liquids for controlled flow, DKA, paroxysmal atrial fibrillation, hypertension, CVA, non-insulin-dependent diabetes mellitus, HOH, anxiety, severe malnutrition and HLD who is admitted with DKA. ?  ?   ?SLP Plan ? All goals met ? ?  ?  ?Recommendations for follow up therapy are one component of a multi-disciplinary discharge planning process, led by the attending physician.  Recommendations may be updated based on patient status, additional functional criteria and insurance authorization. ?  ? ?Recommendations  ?Diet recommendations: Dysphagia 1 (puree);Thin liquid ?Liquids provided via: Cup;No straw (her own Dysphagia drink cup) ?Medication Administration: Crushed with puree ?Supervision: Patient able to self feed (w/ setup) ?Compensations: Minimize environmental distractions;Slow rate;Small sips/bites;Lingual sweep for clearance of pocketing;Follow solids with liquid ?Postural Changes and/or Swallow Maneuvers: Out of bed for meals;Seated upright 90 degrees;Upright 30-60 min after meal  ?   ?    ?   ? ? ? ? General recommendations:  (Dietician f/u; Palliative Care ongoing) ?Oral Care Recommendations: Oral care BID;Oral care before and after PO;Staff/trained caregiver to provide oral care ?Follow Up Recommendations: Follow physician's recommendations for discharge plan and follow up therapies (if needed for diet upgrade (food consistency) in the next 2-3 weeks) ?Assistance recommended at discharge: Intermittent Supervision/Assistance ?SLP Visit Diagnosis: Dysphagia, oropharyngeal phase (R13.12) (suspect related to Baseline Cognitive decline/Dementia) ?Plan: All goals met ? ? ? ? ?  ?  ? ? ? ? ?Orinda Kenner,  MS, CCC-SLP ?Speech Language Pathologist ?Rehab Services; [place] ?[phone] (ascom) ?Niomi Valent ? ?10/25/2021, 12:35 PM ?

## 2021-10-25 NOTE — Progress Notes (Signed)
?Progress Note ? ? ?Patient: Angela Patterson WIO:[id number] DOB: 02-23-1921 DOA: 10/22/2021     3 ?DOS: the patient was seen and examined on 10/25/2021 ?  ?Brief hospital course: ?[name] is a 88 year old female with history of paroxysmal atrial fibrillation, hypertension, history of CVA, dementia, non-insulin-dependent diabetes mellitus, who presents emergency department for chief concerns of nausea and vomiting and altered mental status.  Patient takes Jardiance for her diabetes. ? ?ED course: Afebrile, RR 22, HR 102, BP 160/61 and improved to 150/103, spO2 of 96% on room air.  Labs notable for hypernatremia 149, BUN 56, creatinine 1.19, glucose 287, GFR 41, WBC 14.2, hemoconcentration with Hbg 16.2.  Anion gap elevated at 17, total bili 1.7, urinalysis with ketonuria but no evidence of infection. ? ?Patient admitted with DKA likely secondary to [facility].  Started on insulin infusion.  Once DKA resolved, patient transitioned to subcutaneous insulin.  Angela Patterson is held and will be discontinued at discharge. ? ?Patient developed A-fib with RVR requiring Cardizem drip.  Blood pressures are soft but MAP stable, continued monitoring patient in ICU another day. ?Started on short-acting oral Cardizem 4/7 with reasonable HR control (variable HR 52-110), BP's stable. ? ? ?Assessment and Plan: ?* DKA (diabetic ketoacidosis) ([place]) ?Resolved.  Mild, possibly due to Jardiance, patient has never been on insulin.  Patient presented with anion gap metabolic acidosis, ketonuria.   ?Resolved with insulin infusion. ?Transitioned to subcutaneous insulin. ? ?Protein-calorie malnutrition, severe ?Related to advanced age, mild dementia, social/environmental circumstances, as evidenced by severe fat and muscle depletion. ?-- Dietitian following, appreciate recommendations ?-- Started on Glucerna shakes, MVI ?--Daily weights ?-- Monitor for refeeding syndrome ?Body mass index is 16.39 kg/m?. ? ? ?Hypernatremia ?Resolved.  Presented with  sodium 149.  Likely due to dehydration in the setting of DKA and free water deficit.    Monitor BMP. ? ?AKI (acute kidney injury) ([place]) ?Presented with creatinine 1.19, mild AKI. ?Prerenal etiology in the setting of dehydration related to DKA.  Creatinine normalized with IV fluids.  Monitor BMP. ? ?Creatinine today 0.63 ? ?Nausea and vomiting ?POA, now resolved.  Suspect due to DKA. ?Zofran as needed ? ?SIRS due to infectious process with acute organ dysfunction ([place]) ?Presented with tachypnea, leukocytosis. ?UA and chest x-ray negative for infection.  Suspect SIRS due to DKA. ?--Stop antibiotics and monitor clinically ?-- Follow-up blood cultures, NGTD ?-- Off IV fluids ? ?Leukocytosis ?Suspect due to hypovolemia.  Patient is afebrile, no leukocytosis, family deny recent signs or symptoms of infection.  Started on broad-spectrum antibiotics on admission. ?-- Stop antibiotics and monitor clinically for signs or symptoms of infection ? ?Paroxysmal atrial fibrillation ([facility]) ?With DKA, went into with A-fib RVR requiring Cardizem drip.   ?Not on anticoagulation presumed due to falls.  Is on Plavix.   ?Transitioned to short acting oral Cardizem yesterday.  Heart rates appear overall well controlled, labile, ranging from 52-110 overnight and this morning. ?--Transition to Cardizem CD 180 mg daily ?-- Monitor on telemetry ?-- Monitor replace electrolytes ? ?Echocardiogram 4/7: EF 60 to 65% without LV regional wall motion abnormalities, indeterminate diastolic parameters, mild to moderate MR, moderate TR ? ?Acute metabolic encephalopathy ?POA, appears resolved.  Likely due to DKA.  Yesterday, patient was lethargic, only opened eyes briefly.  Family report baseline mild dementia with no sundowning type behaviors.  No focal neurologic deficits.  This morning, patient sitting up, awake, talkative able to express needs.  Working with SLP. ?Appears at her baseline mental status. ?--  Delirium precautions ?-- Manage underlying  conditions as outlined ? ?Anxiety ?Continue home sertraline ? ?Type 2 diabetes mellitus ([place]) ?Presented with DKA, suspected due to [place].  Stop Jardiance.  Now on subcutaneous insulin.  Will discuss for change in therapy with family.  Monitor CBGs.  Sliding scale NovoLog. ? ?Pure hypercholesterolemia ?On statin ? ?Essential hypertension ?Hold amlodipine with soft blood pressures on Cardizem drip.  BPs have improved since. ?4/7 started on short acting oral Cardizem ?-- Transition to Cardizem CD 180 mg daily ? ?History of CVA (cerebrovascular accident) ?On Plavix and statin ? ?Hypercholesterolemia ?Continue statin ? ? ? ? ?  ? ?Subjective: Patient awake sitting up in bed with grandson and great-grandson at bedside when seen this morning.  She reports being tired, but otherwise denies acute complaints including feeling sick or having any pain.  Per SLP, did great with ability to drink water today without signs of aspiration. ? ?Physical Exam: ?Vitals:  ? 10/25/21 0349 10/25/21 0426 10/25/21 0734 10/25/21 1150  ?BP: 119/86  (!) 139/92 120/61  ?Pulse: (!) 110  (!) 101 90  ?Resp: '16  18 19  '$ ?Temp: 97.9 ?F (36.6 ?C)  97.9 ?F (36.6 ?C) (!) 97.4 ?F (36.3 ?C)  ?TempSrc: Axillary   Axillary  ?SpO2: 98%  97% 100%  ?Weight:  46.2 kg    ?Height:      ? ?General exam: awake, alert, no acute distress, frail ?HEENT: Mucous membranes, hard of hearing ?Respiratory system: CTAB, no wheezes, rales or rhonchi, normal respiratory effort. ?Cardiovascular system: normal S1/S2, RRR, no pedal edema.   ?Gastrointestinal system: soft, nontender with bowel sounds present ?Central nervous system: A&O x self and hospital. no gross focal neurologic deficits, normal speech ?Extremities: moves all, no cyanosis, normal tone ?Skin: dry, intact, normal temperature ?Psychiatry: normal mood, congruent affect, abnormal judgement and insight due to dementia ? ? ? ?Data Reviewed: ? ?Labs reviewed and notable for glucose 153 on BMP, calcium 8.6.  CBGs  today 159, 153, 246 ? ?Family Communication: Grandson and great-grandson at bedside on rounds today ? ?Disposition: ?Status is: Inpatient ?Remains inpatient appropriate because: Requires SNF placement for short-term rehab.  Still with intermittent tachycardia transitioning from short acting to long-acting rate control agent as outlined, continue monitoring on telemetry. ? ? Planned Discharge Destination: Skilled nursing facility ? ? ? ?Time spent: 35 minutes ? ?Author: ?Ezekiel Slocumb, DO ?10/25/2021 3:44 PM ? ?For on call review [email or web address].  ?

## 2021-10-26 DIAGNOSIS — L97509 Non-pressure chronic ulcer of other part of unspecified foot with unspecified severity: Secondary | ICD-10-CM | POA: Diagnosis not present

## 2021-10-26 DIAGNOSIS — E11621 Type 2 diabetes mellitus with foot ulcer: Secondary | ICD-10-CM | POA: Diagnosis not present

## 2021-10-26 DIAGNOSIS — I480 Paroxysmal atrial fibrillation: Secondary | ICD-10-CM | POA: Diagnosis not present

## 2021-10-26 DIAGNOSIS — E1110 Type 2 diabetes mellitus with ketoacidosis without coma: Secondary | ICD-10-CM | POA: Diagnosis not present

## 2021-10-26 LAB — GLUCOSE, CAPILLARY
Glucose-Capillary: 204 mg/dL — ABNORMAL HIGH (ref 70–99)
Glucose-Capillary: 265 mg/dL — ABNORMAL HIGH (ref 70–99)
Glucose-Capillary: 254 mg/dL — ABNORMAL HIGH (ref 70–99)
Glucose-Capillary: 362 mg/dL — ABNORMAL HIGH (ref 70–99)

## 2021-10-26 MED ORDER — DILTIAZEM HCL ER COATED BEADS 120 MG PO CP24
240.0000 mg | ORAL_CAPSULE | Freq: Every day | ORAL | Status: DC
  Administered 2021-10-26 – 2021-10-30 (×5): 240 mg via ORAL
  Filled 2021-10-26 (×5): qty 2

## 2021-10-26 MED ORDER — INSULIN GLARGINE-YFGN 100 UNIT/ML ~~LOC~~ SOLN
6.0000 [IU] | Freq: Every day | SUBCUTANEOUS | Status: DC
  Administered 2021-10-26 – 2021-10-27 (×2): 6 [IU] via SUBCUTANEOUS
  Filled 2021-10-26 (×3): qty 0.06

## 2021-10-26 NOTE — Progress Notes (Signed)
?Progress Note ? ? ?Patient: Angela Patterson JJK:[id number] DOB: 1920/09/12 DOA: 10/22/2021     4 ?DOS: the patient was seen and examined on 10/26/2021 ?  ?Brief hospital course: ?Ms. Wanette Robison is a 88 year old female with history of paroxysmal atrial fibrillation, hypertension, history of CVA, dementia, non-insulin-dependent diabetes mellitus, who presents emergency department for chief concerns of nausea and vomiting and altered mental status.  Patient takes Jardiance for her diabetes. ? ?ED course: Afebrile, RR 22, HR 102, BP 160/61 and improved to 150/103, spO2 of 96% on room air.  Labs notable for hypernatremia 149, BUN 56, creatinine 1.19, glucose 287, GFR 41, WBC 14.2, hemoconcentration with Hbg 16.2.  Anion gap elevated at 17, total bili 1.7, urinalysis with ketonuria but no evidence of infection. ? ?Patient admitted with DKA likely secondary to [facility].  Started on insulin infusion.  Once DKA resolved, patient transitioned to subcutaneous insulin.  [name] is held and will be discontinued at discharge. ? ?Patient developed A-fib with RVR requiring Cardizem drip.  Blood pressures are soft but MAP stable, continued monitoring patient in ICU another day. ?Started on short-acting oral Cardizem 4/7 with reasonable HR control (variable HR 52-110), BP's stable. ? ? ?Assessment and Plan: ?* DKA (diabetic ketoacidosis) ([place]) ?Resolved.  Mild, possibly due to Jardiance, patient has never been on insulin.  Patient presented with anion gap metabolic acidosis, ketonuria.   ?Resolved with insulin infusion. ?Transitioned to subcutaneous insulin. ? ?Protein-calorie malnutrition, severe ?Related to advanced age, mild dementia, social/environmental circumstances, as evidenced by severe fat and muscle depletion. ?-- Dietitian following, appreciate recommendations ?-- Started on Glucerna shakes, MVI ?--Daily weights ?-- Monitor for refeeding syndrome ?Body mass index is 16.39 kg/m?. ? ? ?Hypernatremia ?Resolved.  Presented with  sodium 149.  Likely due to dehydration in the setting of DKA and free water deficit.    Monitor BMP. ? ?AKI (acute kidney injury) ([place]) ?Presented with creatinine 1.19, mild AKI. ?Prerenal etiology in the setting of dehydration related to DKA.  Creatinine normalized with IV fluids.  Monitor BMP. ? ?Creatinine today 0.63 ? ?Nausea and vomiting ?POA, now resolved.  Suspect due to DKA. ?Zofran as needed ? ?SIRS due to infectious process with acute organ dysfunction ([place]) ?Presented with tachypnea, leukocytosis. ?UA and chest x-ray negative for infection.  Suspect SIRS due to DKA. ?--Stop antibiotics and monitor clinically ?-- Follow-up blood cultures, NGTD ?-- Off IV fluids ? ?Leukocytosis ?Suspect due to hypovolemia.  Patient is afebrile, no leukocytosis, family deny recent signs or symptoms of infection.  Started on broad-spectrum antibiotics on admission. ?-- Stop antibiotics and monitor clinically for signs or symptoms of infection ? ?Paroxysmal atrial fibrillation (HCC) ?With DKA, went into with A-fib RVR requiring Cardizem drip.   ?Not on anticoagulation presumed due to falls.  Is on Plavix.   ? ?BP tolerating long-acting Cardizem and rates overall well controlled, elevated at times. ?-- Increase Cardizem CD 180 >>240 mg daily ?-- Monitor on telemetry ?-- Monitor replace electrolytes ? ?Echocardiogram 4/7: EF 60 to 65% without LV regional wall motion abnormalities, indeterminate diastolic parameters, mild to moderate MR, moderate TR ? ?Acute metabolic encephalopathy ?POA, appears resolved.  Likely due to DKA.  Yesterday, patient was lethargic, only opened eyes briefly.  Family report baseline mild dementia with no sundowning type behaviors.  No focal neurologic deficits.  This morning, patient sitting up, awake, talkative able to express needs.  Working with SLP. ?Appears at her baseline mental status. ?--Delirium precautions ?-- Manage underlying conditions as outlined ? ?  Anxiety ?Continue home  sertraline ? ?Type 2 diabetes mellitus ([place]) ?Presented with DKA, suspected due to [place].  Stop Jardiance.  Now on subcutaneous insulin.  Will discuss for change in therapy with family.  Monitor CBGs.  Sliding scale NovoLog. ? ?4/9: Hyperglycemia as high as 370 in the past 24 hours, multiple above 200 ?-- Add Semglee 60 units daily ? ?Pure hypercholesterolemia ?On statin ? ?Essential hypertension ?Hold amlodipine with soft blood pressures on Cardizem drip.  BPs have improved since. ?4/7 started on short acting oral Cardizem ?-- Transition to Cardizem CD 180 mg daily ? ?History of CVA (cerebrovascular accident) ?On Plavix and statin ? ?Hypercholesterolemia ?Continue statin ? ? ? ? ?  ? ?Subjective: Patient was sleeping comfortably with no patient at bedside when seen on rounds this morning.  She woke briefly denied pain or feeling sick.  No other acute complaints or acute events reported. ? ?Physical Exam: ?Vitals:  ? 10/26/21 0039 10/26/21 0408 10/26/21 0411 10/26/21 6270  ?BP: (!) 127/59  133/78 (!) 139/94  ?Pulse: 100  86 (!) 107  ?Resp: '16  16 14  '$ ?Temp: 98 ?F (36.7 ?C)  98.4 ?F (36.9 ?C) (!) 97.4 ?F (36.3 ?C)  ?TempSrc:   Oral   ?SpO2: 96%  95% 98%  ?Weight:  46 kg    ?Height:      ? ?General exam: Sleeping comfortably, no acute distress, frail  ?Respiratory system: CTAB, no wheezes, rales or rhonchi, normal respiratory effort. ?Cardiovascular system: normal J5/K0, RRR, systolic murmur noted, no pedal edema.   ?Gastrointestinal system: soft, nondistended abdomen. ?Central nervous system: Unable to evaluate due to somnolence ?Extremities: no edema, normal tone ?Skin: dry, intact, normal temperature, patchy ecchymosis on bilateral upper extremities ?Psychiatry: Unable to evaluate due to somnolence ? ? ?Data Reviewed: ? ?Labs reviewed and notable for hyperglycemia. CBGs since yesterday morning: 153 >> 246 >> 227 >> 370 at bedtime last night >> 204 this morning ? ?Family Communication: None at bedside.  Family  were at bedside on rounds yesterday.   ? ?Disposition: ?Status is: Inpatient ?Remains inpatient appropriate because: Awaiting SNF placement for rehab.  Hyperglycemia with insulin regimen changes underway. ? ? ? Planned Discharge Destination: Skilled nursing facility ? ? ? ?Time spent: 35 minutes ? ?Author: ?Ezekiel Slocumb, DO ?10/26/2021 4:09 PM ? ?For on call review [email or web address].  ?

## 2021-10-27 ENCOUNTER — Other Ambulatory Visit: Payer: Self-pay

## 2021-10-27 DIAGNOSIS — Z7189 Other specified counseling: Secondary | ICD-10-CM

## 2021-10-27 DIAGNOSIS — L97509 Non-pressure chronic ulcer of other part of unspecified foot with unspecified severity: Secondary | ICD-10-CM | POA: Diagnosis not present

## 2021-10-27 DIAGNOSIS — E43 Unspecified severe protein-calorie malnutrition: Secondary | ICD-10-CM | POA: Diagnosis not present

## 2021-10-27 DIAGNOSIS — E11621 Type 2 diabetes mellitus with foot ulcer: Secondary | ICD-10-CM | POA: Diagnosis not present

## 2021-10-27 DIAGNOSIS — E1110 Type 2 diabetes mellitus with ketoacidosis without coma: Secondary | ICD-10-CM | POA: Diagnosis not present

## 2021-10-27 LAB — GLUCOSE, CAPILLARY
Glucose-Capillary: 179 mg/dL — ABNORMAL HIGH (ref 70–99)
Glucose-Capillary: 339 mg/dL — ABNORMAL HIGH (ref 70–99)
Glucose-Capillary: 296 mg/dL — ABNORMAL HIGH (ref 70–99)
Glucose-Capillary: 248 mg/dL — ABNORMAL HIGH (ref 70–99)

## 2021-10-27 LAB — BASIC METABOLIC PANEL
Anion gap: 6 (ref 5–15)
BUN: 14 mg/dL (ref 8–23)
CO2: 30 mmol/L (ref 22–32)
Calcium: 8.4 mg/dL — ABNORMAL LOW (ref 8.9–10.3)
Chloride: 100 mmol/L (ref 98–111)
Creatinine, Ser: 0.56 mg/dL (ref 0.44–1.00)
GFR, Estimated: >60 mL/min (ref >60)
Glucose, Bld: 186 mg/dL — ABNORMAL HIGH (ref 70–99)
Potassium: 3.8 mmol/L (ref 3.5–5.1)
Sodium: 136 mmol/L (ref 135–145)

## 2021-10-27 LAB — CULTURE, BLOOD (ROUTINE X 2)
Culture: NO GROWTH
Culture: NO GROWTH

## 2021-10-27 LAB — MAGNESIUM: Magnesium: 2 mg/dL (ref 1.7–2.4)

## 2021-10-27 MED ORDER — LIDOCAINE 5 % EX PTCH
1.0000 | MEDICATED_PATCH | CUTANEOUS | Status: DC
  Administered 2021-10-28 – 2021-10-29 (×2): 1 via TRANSDERMAL
  Filled 2021-10-27 (×3): qty 1

## 2021-10-27 MED ORDER — ACETAMINOPHEN 325 MG PO TABS
650.0000 mg | ORAL_TABLET | Freq: Once | ORAL | Status: AC
  Administered 2021-10-27: 650 mg via ORAL
  Filled 2021-10-27: qty 2

## 2021-10-27 MED ORDER — INSULIN ASPART 100 UNIT/ML IJ SOLN
3.0000 [IU] | Freq: Three times a day (TID) | INTRAMUSCULAR | Status: DC
  Administered 2021-10-28 – 2021-10-30 (×6): 3 [IU] via SUBCUTANEOUS
  Filled 2021-10-27 (×6): qty 1

## 2021-10-27 NOTE — [place] FL2 (Signed)
?[place] MEDICAID FL2 LEVEL OF CARE SCREENING TOOL  ?  ? ?IDENTIFICATION  ?Patient Name: ?Angela Patterson Birthdate: [birth year] Sex: female Admission Date (Current Location): ?10/22/2021  ?[place] and [place] Number: ? [place] ?  Facility and Address:  ?[facility], [address], [place], [place] [address] ?     Provider Number: ?[id number]  ?Attending Physician Name and Address:  ?Ezekiel Slocumb, DO ? Relative Name and Phone Number:  ?[name] (daughter) [phone] ?   ?Current Level of Care: ?Hospital Recommended Level of Care: ?[place] Prior Approval Number: ?  ? ?Date Approved/Denied: ?  PASRR Number: ?[id number] ? ?Discharge Plan: ?SNF ?  ? ?Current Diagnoses: ?Patient Active Problem List  ? Diagnosis Date Noted  ? Protein-calorie malnutrition, severe 10/24/2021  ? Hypernatremia 10/23/2021  ? DKA (diabetic ketoacidosis) ([place]) 10/22/2021  ? Leukocytosis 10/22/2021  ? SIRS due to infectious process with acute organ dysfunction ([place]) 10/22/2021  ? Nausea and vomiting 10/22/2021  ? AKI (acute kidney injury) ([place]) 10/22/2021  ? Malnutrition of moderate degree 08/22/2021  ? Glaucoma 08/21/2021  ? Laceration of eyebrow and forehead, initial encounter 08/21/2021  ? Laceration of right lower leg 08/21/2021  ? Paroxysmal atrial fibrillation ([place]) 08/21/2021  ? Swallowing difficulty 07/06/2021  ? Wound of right leg 06/13/2021  ? Abnormal urine odor 05/20/2021  ? Weakness 10/27/2020  ? Lobar pneumonia ([place])   ? Lactic acidosis   ? Acute metabolic encephalopathy   ? CAP (community acquired pneumonia) 10/18/2020  ? Cough 09/15/2020  ? Finger lesion 06/13/2020  ? Nasal lesion 06/13/2020  ? Hearing impairment 12/18/2019  ? Finger laceration 02/21/2019  ? Gait abnormality 11/13/2018  ? Fall at home, initial encounter 08/28/2018  ? Anxiety 06/22/2018  ? Diarrhea 12/13/2017  ? Rectal bleeding 07/07/2017  ? Ulcer of right foot with fat layer exposed ([place]) 04/18/2017  ? Open wound of  foot excluding toes without complication 23/53/6144  ? Health care maintenance 10/27/2014  ? Weight loss 12/27/2013  ? Thrombocytopenia ([place]) 11/20/2012  ? Hypercholesterolemia 07/21/2012  ? History of CVA (cerebrovascular accident) 07/21/2012  ? Essential hypertension 07/21/2012  ? Pure hypercholesterolemia 07/21/2012  ? Type 2 diabetes mellitus ([place] ) 07/21/2012  ? ? ?Orientation RESPIRATION BLADDER Height & Weight   ?  ?Self, Time ? Normal Incontinent, External catheter Weight: 100 lb 15.5 oz (45.8 kg) ?Height:  '5\' 4"'$  (162.6 cm)  ?BEHAVIORAL SYMPTOMS/MOOD NEUROLOGICAL BOWEL NUTRITION STATUS  ?    Incontinent Diet (see discharge summary)  ?AMBULATORY STATUS COMMUNICATION OF NEEDS Skin   ?Limited Assist Verbally Other (Comment) (pressure injury right foot) ?  ?  ?  ?    ?     ?     ? ? ?Personal Care Assistance Level of Assistance  ?Bathing, Feeding, Dressing, Total care Bathing Assistance: Limited assistance ?Feeding assistance: Limited assistance ?Dressing Assistance: Limited assistance ?Total Care Assistance: Limited assistance  ? ?Functional Limitations Info  ?Hearing, Sight, Speech Sight Info: Adequate ?Hearing Info: Impaired ?Speech Info: Adequate  ? ? ?SPECIAL CARE FACTORS FREQUENCY  ?PT (By licensed PT), OT (By licensed OT)   ?  ?PT Frequency: min 4x weekly ?OT Frequency: min 4x weekly ?  ?  ?  ?   ? ? ?Contractures Contractures Info: Not present  ? ? ?Additional Factors Info  ?Code Status, Allergies Code Status Info: DNR ?Allergies Info: Actos (Pioglitazone)   Contrast Media (Iodinated Contrast Media)   Prandin (Repaglinide)   Pravastatin Sodium ?  ?  ?  ?   ? ?  Current Medications (10/27/2021):  This is the current hospital active medication list ?Current Facility-Administered Medications  ?Medication Dose Route Frequency Provider Last Rate Last Admin  ? Chlorhexidine Gluconate Cloth 2 % PADS 6 each  6 each Topical Q0600 Cox, Amy N, DO   6 each at 10/27/21 0433  ? clopidogrel (PLAVIX) tablet 75 mg  75 mg  Oral Daily Cox, Amy N, DO   75 mg at 10/27/21 0900  ? dextrose 50 % solution 0-50 mL  0-50 mL Intravenous PRN Cox, Amy N, DO      ? diltiazem (CARDIZEM CD) 24 hr capsule 240 mg  240 mg Oral Daily Nicole Kindred A, DO   240 mg at 10/27/21 0900  ? enoxaparin (LOVENOX) injection 30 mg  30 mg Subcutaneous Q24H Cox, Amy N, DO   30 mg at 10/26/21 2118  ? feeding supplement (GLUCERNA SHAKE) (GLUCERNA SHAKE) liquid 237 mL  237 mL Oral TID BM Nicole Kindred A, DO   237 mL at 10/27/21 0910  ? insulin aspart (novoLOG) injection 0-9 Units  0-9 Units Subcutaneous TID WC Nicole Kindred A, DO   5 Units at 10/26/21 1715  ? insulin glargine-yfgn (SEMGLEE) injection 6 Units  6 Units Subcutaneous Daily Nicole Kindred A, DO   6 Units at 10/26/21 1057  ? metoprolol tartrate (LOPRESSOR) injection 5 mg  5 mg Intravenous Q5 min PRN Cox, Amy N, DO      ? multivitamin with minerals tablet 1 tablet  1 tablet Oral Daily Nicole Kindred A, DO   1 tablet at 10/27/21 0900  ? nystatin (MYCOSTATIN) 100000 UNIT/ML suspension 500,000 Units  5 mL Oral QID Nicole Kindred A, DO   500,000 Units at 10/27/21 0900  ? ondansetron (ZOFRAN) tablet 4 mg  4 mg Oral Q6H PRN Cox, Amy N, DO      ? Or  ? ondansetron (ZOFRAN) injection 4 mg  4 mg Intravenous Q6H PRN Cox, Amy N, DO      ? rosuvastatin (CRESTOR) tablet 5 mg  5 mg Oral Once per day on Mon Thu Cox, Amy N, DO      ? sertraline (ZOLOFT) tablet 50 mg  50 mg Oral Daily Cox, Amy N, DO   50 mg at 10/27/21 3005  ? ? ? ?Discharge Medications: ?Please see discharge summary for a list of discharge medications. ? ?Relevant Imaging Results: ? ?Relevant Lab Results: ? ? ?Additional Information ?SSN: [id number] ? ?Alberteen Sam, LCSW ? ? ? ? ?

## 2021-10-27 NOTE — Consult Note (Addendum)
? ?                                                                                ?Consultation Note ?Date: 10/27/2021  ? ?Patient Name: Angela Patterson  ?DOB: 1921-07-16  MRN: [id number]  Age / Sex: 88 y.o., female  ?PCP: Einar Pheasant, MD ?Referring Physician: Ezekiel Slocumb, DO ? ?Reason for Consultation: Establishing goals of care ? ?HPI/Patient Profile: Angela Patterson is a 88 year old female with history of paroxysmal atrial fibrillation, hypertension, history of CVA, dementia, non-insulin-dependent diabetes mellitus, who presents emergency department for chief concerns of nausea and vomiting and altered mental status. ? ?Clinical Assessment and Goals of Care: ?Patient is sitting in bed with daughter and son in law at bedside.  They discussed that the patient has 3 children and is widowed. She is very HOH with hearing aids squealing when she turns her head. Daughter states she does not leave hearing aids here because she is concerned they will be thrown away. They tell me that she has been at [facility] for the past month following a hospitalization. ? ?At baseline, daughter states patient is an outdoor person who was using a pole saw a few months ago.  She states that she had been tending her garden and taking care of her house.  Her family would come in and check on her daily but she was independent at baseline.  ? ?We discussed her diagnoses, prognosis, GOC, EOL wishes disposition and options. ? ?Created space and opportunity for patient  to explore thoughts and feelings regarding current medical information.  They state with every decline in the past patient has been able to bounce back after a couple weeks.  They state she does not bounce 100% back to where she was, but close to it. They tell me they have understood that at some point she will not bounce back. ? ?A detailed discussion was had today regarding advanced directives.  Concepts specific to code status, artifical feeding and hydration, IV  antibiotics and rehospitalization were discussed.  The difference between an aggressive medical intervention path and a comfort care path was discussed.  Values and goals of care important to patient and family were attempted to be elicited. ? ?Discussed limitations of medical interventions to prolong quality of life in some situations and discussed the concept of human mortality. ? ?They state she hates living in a facility.  They state she is not a woman that wants to sit or be bored.  They discussed that losing her functional independence would be worse than death for her. ? ?Discussed seeing how she does with her new pur?ed diet.  Discussed that if she does not do well with a pur?ed diet, considering transitioning her to her regular diet, and allow her to do things her way and on her terms.  ? ?They confirm DNR/DNI.  ? ?Recommend palliative to continue to follow outpatient. ? ?SUMMARY OF RECOMMENDATIONS   ?Recommend outpatient palliative to follow closely with transition to hospice when patient and family are ready. ? ? ?  ? ?Primary Diagnoses: ?Present on Admission: ? Anxiety ? Acute metabolic encephalopathy ? Essential hypertension ? Hypercholesterolemia ? DKA (  diabetic ketoacidosis) ([place]) ? Paroxysmal atrial fibrillation (HCC) ? Pure hypercholesterolemia ? Leukocytosis ? SIRS due to infectious process with acute organ dysfunction ([place]) ? Nausea and vomiting ? AKI (acute kidney injury) ([place]) ? Hypernatremia ? ? ?I have reviewed the medical record, interviewed the patient and family, and examined the patient. The following aspects are pertinent. ? ?Past Medical History:  ?Diagnosis Date  ? Allergy   ? Chicken pox   ? CVA (cerebral vascular accident) [facility])   ? Diabetes mellitus ([place])   ? Hypercholesterolemia   ? Hypertension   ? ?Social History  ? ?Socioeconomic History  ? Marital status: Widowed  ?  Spouse name: Not on file  ? Number of children: 3  ? Years of education: Not on file  ? Highest education level:  Not on file  ?Occupational History  ? Not on file  ?Tobacco Use  ? Smoking status: Never  ? Smokeless tobacco: Never  ?Substance and Sexual Activity  ? Alcohol use: No  ?  Alcohol/week: 0.0 standard drinks  ? Drug use: No  ? Sexual activity: Not on file  ?Other Topics Concern  ? Not on file  ?Social History Narrative  ? Not on file  ? ?Social Determinants of Health  ? ?Financial Resource Strain: Medium Risk  ? Difficulty of Paying Living Expenses: Somewhat hard  ?Food Insecurity: No Food Insecurity  ? Worried About Charity fundraiser in the Last Year: Never true  ? Ran Out of Food in the Last Year: Never true  ?Transportation Needs: No Transportation Needs  ? Lack of Transportation (Medical): No  ? Lack of Transportation (Non-Medical): No  ?Physical Activity: Insufficiently Active  ? Days of Exercise per Week: 7 days  ? Minutes of Exercise per Session: 20 min  ?Stress: No Stress Concern Present  ? Feeling of Stress : Not at all  ?Social Connections: Unknown  ? Frequency of Communication with Friends and Family: Not on file  ? Frequency of Social Gatherings with Friends and Family: More than three times a week  ? Attends Religious Services: Not on file  ? Active Member of Clubs or Organizations: Not on file  ? Attends Archivist Meetings: Not on file  ? Marital Status: Not on file  ? ?Family History  ?Problem Relation Age of Onset  ? Liver cancer Father   ? Stroke Mother   ? Heart attack Brother   ? Hypertension Daughter   ? Hypertension Son   ? Hypertension Daughter   ? Cancer Grandchild   ?     breast  ? Diabetes Grandchild   ? Breast cancer Neg Hx   ? Colon cancer Neg Hx   ? ?Scheduled Meds: ? Chlorhexidine Gluconate Cloth  6 each Topical Q0600  ? clopidogrel  75 mg Oral Daily  ? diltiazem  240 mg Oral Daily  ? enoxaparin (LOVENOX) injection  30 mg Subcutaneous Q24H  ? feeding supplement (GLUCERNA SHAKE)  237 mL Oral TID BM  ? insulin aspart  0-9 Units Subcutaneous TID WC  ? insulin glargine-yfgn  6  Units Subcutaneous Daily  ? multivitamin with minerals  1 tablet Oral Daily  ? nystatin  5 mL Oral QID  ? rosuvastatin  5 mg Oral Once per day on Mon Thu  ? sertraline  50 mg Oral Daily  ? ?Continuous Infusions: ?PRN Meds:.dextrose, metoprolol tartrate, ondansetron **OR** ondansetron (ZOFRAN) IV ?Medications Prior to Admission:  ?Prior to Admission medications   ?Medication Sig Start Date End  Date Taking? Authorizing Provider  ?acetaminophen (TYLENOL) 325 MG tablet Take 325 mg by mouth every 4 (four) hours as needed for mild pain.   Yes [provider]  ?amLODipine (NORVASC) 5 MG tablet TAKE 1 TABLET(5 MG) BY MOUTH TWICE DAILY 04/17/21  Yes Einar Pheasant, MD  ?ascorbic acid (VITAMIN C) 500 MG tablet Take 500 mg by mouth daily.   Yes [provider]  ?Cholecalciferol (VITAMIN D) 2000 units tablet Take 2,000 Units by mouth daily.    Yes [provider]  ?clopidogrel (PLAVIX) 75 MG tablet TAKE 1 TABLET(75 MG) BY MOUTH DAILY 10/11/21  Yes Kennyth Arnold, FNP  ?Cranberry 500 MG CAPS Take 500 mg by mouth daily.   Yes [provider]  ?empagliflozin (JARDIANCE) 25 MG TABS tablet Take 1 tablet (25 mg total) by mouth daily. 01/22/21  Yes Einar Pheasant, MD  ?Astrid Drafts 1000 MG CAPS Take 1 capsule by mouth daily.   Yes [provider]  ?Multiple Vitamin (MULTIVITAMIN) tablet Take 1 tablet by mouth daily.   Yes [provider]  ?omeprazole (PRILOSEC) 20 MG capsule Take 20 mg by mouth daily.   Yes [provider]  ?ondansetron (ZOFRAN) 4 MG tablet Take 4 mg by mouth every 6 (six) hours as needed for nausea or vomiting.   Yes [provider]  ?rosuvastatin (CRESTOR) 5 MG tablet TAKE 1 TABLET(5 MG) BY MOUTH 2 TIMES A WEEK ?Patient taking differently: Take 5 mg by mouth See admin instructions. Take 1 tablet ('5mg'$ ) by mouth every Monday and Thursday morning 03/14/21  Yes Einar Pheasant, MD  ?sertraline (ZOLOFT) 50 MG tablet Take 50 mg by mouth daily.   Yes  [provider]  ?timolol (BETIMOL) 0.5 % ophthalmic solution Place 1 drop into both eyes in the morning.   Yes [provider]  ? ?Allergies  ?Allergen Reactions  ? Actos [Pioglitazone] Swelling

## 2021-10-27 NOTE — Progress Notes (Signed)
Inpatient Diabetes Program Recommendations ? ?AACE/ADA: New Consensus Statement on Inpatient Glycemic Control (2015) ? ?Target Ranges:  Prepandial:   less than 140 mg/dL ?     Peak postprandial:   less than 180 mg/dL (1-2 hours) ?     Critically ill patients:  140 - 180 mg/dL  ? ? Latest Reference Range & Units 10/26/21 08:34 10/26/21 12:34 10/26/21 16:31 10/26/21 22:13  ?Glucose-Capillary 70 - 99 mg/dL 204 (H) ? ?3 units Novolog 265 (H) ? ?5 units Novolog ? ?6 units Semglee 254 (H) ? ?5 units Novolog 362 (H)  ?(H): Data is abnormally high ? Latest Reference Range & Units 10/27/21 08:23 10/27/21 12:19  ?Glucose-Capillary 70 - 99 mg/dL 179 (H) ? ?2 units Novolog ? ?6 units Semglee 339 (H) ? ?7 units Novolog ?  ?(H): Data is abnormally high ? ? ? ?Home DM Meds: Jardiance 25 mg daily ? ?Current Orders: Semglee 6 units daily ?    Novolog Sensitive Correction Scale/ SSI (0-9 units) TID AC  ? ? ?MD- Note Semglee started yesterday AM ? ?AM CBG better but still quite elevated at 12pm today ? ?Looks like pt is getting Pureed PO diet + PO Supps (Glucerna TID between meals) ? ?May consider starting Novolog Meal Coverage: Novolog 3 units TID with meals to start ?HOLD if pt eats <50% meal ? ? ? ?Pt is on an SGLT-2, Jardiance, at home which could precipitate DKA. Would recommend d/c prior to discharge and have prescriber follow up with pt. Pt may need insulin at Facility as A1c was elevated in February ? ? ? ?--Will follow patient during hospitalization-- ? ?Wyn Quaker RN, MSN, CDE ?Diabetes Coordinator ?Inpatient Glycemic Control Team ?Team Pager: [phone] (8a-5p) ? ?

## 2021-10-27 NOTE — TOC Initial Note (Signed)
Transition of Care (TOC) - Initial/Assessment Note  ? ? ?Patient Details  ?Name: Angela Patterson ?MRN: [id number] ?Date of Birth: 1920-07-30 ? ?Transition of Care (TOC) CM/SW Contact:    ?Alberteen Sam, LCSW ?Phone Number: ?10/27/2021, 9:29 AM ? ?Clinical Narrative:                 ? ?Patient from [facility] [place] spoke with patient's daughter [name] about upgraded PT recs of SNF. She's in agreement with preference of [facility].  ? ?CSW has sent referral and reached out to [facility] andTammy at [facility] pending bed offer at this time.  ? ?Expected Discharge Plan: [place] ?Barriers to Discharge: Continued Medical Work up ? ? ?Patient Goals and CMS Choice ?Patient states their goals for this hospitalization and ongoing recovery are:: to go home ?CMS Medicare.gov Compare Post Acute Care list provided to:: Patient Represenative (must comment) (daughter [name]) ?Choice offered to / list presented to : Adult Children ? ?Expected Discharge Plan and Services ?Expected Discharge Plan: [place] ?  ?  ?  ?Living arrangements for the past 2 months: [place]) ?                ?  ?  ?  ?  ?  ?  ?  ?  ?  ?  ? ?Prior Living Arrangements/Services ?Living arrangements for the past 2 months: [place]) ?Lives with:: Facility Resident ?  ?       ?  ?  ?  ?  ? ?Activities of Daily Living ?  ?ADL Screening (condition at time of admission) ?Patient's cognitive ability adequate to safely complete daily activities?: Yes ?Is the patient deaf or have difficulty hearing?: Yes ?Does the patient have difficulty seeing, even when wearing glasses/contacts?: Yes ?Does the patient have difficulty concentrating, remembering, or making decisions?: Yes ?Patient able to express need for assistance with ADLs?: Yes ?Does the patient have difficulty dressing or bathing?: Yes ?Independently performs ADLs?: No ?Communication: Dependent ? ?Permission Sought/Granted ?  ?  ?   ?   ?   ?    ? ?Emotional Assessment ?  ?  ?  ?  ?  ?  ? ?Admission diagnosis:  Urinary retention [R33.9] ?DKA (diabetic ketoacidosis) ([place]) [E11.10] ?AKI (acute kidney injury) ([place]) [N17.9] ?Atrial fibrillation with RVR ([place]) [I48.91] ?Altered mental status, unspecified altered mental status type [R41.82] ?Patient Active Problem List  ? Diagnosis Date Noted  ? Protein-calorie malnutrition, severe 10/24/2021  ? Hypernatremia 10/23/2021  ? DKA (diabetic ketoacidosis) ([place]) 10/22/2021  ? Leukocytosis 10/22/2021  ? SIRS due to infectious process with acute organ dysfunction ([place]) 10/22/2021  ? Nausea and vomiting 10/22/2021  ? AKI (acute kidney injury) ([place]) 10/22/2021  ? Malnutrition of moderate degree 08/22/2021  ? Glaucoma 08/21/2021  ? Laceration of eyebrow and forehead, initial encounter 08/21/2021  ? Laceration of right lower leg 08/21/2021  ? Paroxysmal atrial fibrillation ([place]) 08/21/2021  ? Swallowing difficulty 07/06/2021  ? Wound of right leg 06/13/2021  ? Abnormal urine odor 05/20/2021  ? Weakness 10/27/2020  ? Lobar pneumonia ([place])   ? Lactic acidosis   ? Acute metabolic encephalopathy   ? CAP (community acquired pneumonia) 10/18/2020  ? Cough 09/15/2020  ? Finger lesion 06/13/2020  ? Nasal lesion 06/13/2020  ? Hearing impairment 12/18/2019  ? Finger laceration 02/21/2019  ? Gait abnormality 11/13/2018  ? Fall at home, initial encounter 08/28/2018  ? Anxiety 06/22/2018  ? Diarrhea 12/13/2017  ?  Rectal bleeding 07/07/2017  ? Ulcer of right foot with fat layer exposed ([place]) 04/18/2017  ? Open wound of foot excluding toes without complication 82/02/1387  ? Health care maintenance 10/27/2014  ? Weight loss 12/27/2013  ? Thrombocytopenia ([place]) 11/20/2012  ? Hypercholesterolemia 07/21/2012  ? History of CVA (cerebrovascular accident) 07/21/2012  ? Essential hypertension 07/21/2012  ? Pure hypercholesterolemia 07/21/2012  ? Type 2 diabetes mellitus ([place]) 07/21/2012  ? ?PCP:  Einar Pheasant, MD ?Pharmacy:   ?[facility] [place] [address] ?[place] [address] ?[address] ?[place] [address] ?Phone: [phone] Fax: [phone] ? ? ? ? ?Social Determinants of Health (SDOH) Interventions ?  ? ?Readmission Risk Interventions ?   ? View : No data to display.  ?  ?  ?  ? ? ? ?

## 2021-10-27 NOTE — Care Management Important Message (Signed)
Important Message ? ?Patient Details  ?Name: Angela Patterson ?MRN: [id number] ?Date of Birth: 1920/08/06 ? ? ?Medicare Important Message Given:  Yes ? ? ? ? ?Dannette Barbara ?10/27/2021, 12:09 PM ?

## 2021-10-27 NOTE — Progress Notes (Addendum)
?Progress Note ? ? ?Patient: Angela Patterson LTJ:[id number] DOB: [birth year] DOA: 10/22/2021     5 ?DOS: the patient was seen and examined on 10/27/2021 ?  ?Brief hospital course: ?[name] is a 88 year old female with history of paroxysmal atrial fibrillation, hypertension, history of CVA, dementia, non-insulin-dependent diabetes mellitus, who presents emergency department for chief concerns of nausea and vomiting and altered mental status.  Patient takes Jardiance for her diabetes. ? ?ED course: Afebrile, RR 22, HR 102, BP 160/61 and improved to 150/103, spO2 of 96% on room air.  Labs notable for hypernatremia 149, BUN 56, creatinine 1.19, glucose 287, GFR 41, WBC 14.2, hemoconcentration with Hbg 16.2.  Anion gap elevated at 17, total bili 1.7, urinalysis with ketonuria but no evidence of infection. ? ?Patient admitted with DKA likely secondary to [facility].  Started on insulin infusion.  Once DKA resolved, patient transitioned to subcutaneous insulin.  [name] is held and will be discontinued at discharge. ? ?Patient developed A-fib with RVR requiring Cardizem drip.  Blood pressures are soft but MAP stable, continued monitoring patient in ICU another day. ?Started on short-acting oral Cardizem 4/7 with reasonable HR control (variable HR 52-110), BP's stable. ? ? ?Assessment and Plan: ?* DKA (diabetic ketoacidosis) ([place]) ?Resolved.  Mild, possibly due to Jardiance, patient has never been on insulin.  Patient presented with anion gap metabolic acidosis, ketonuria.   ?Resolved with insulin infusion. ?Transitioned to subcutaneous insulin. ? ?Protein-calorie malnutrition, severe ?Related to advanced age, mild dementia, social/environmental circumstances, as evidenced by severe fat and muscle depletion. ?-- Dietitian following, appreciate recommendations ?-- Started on Glucerna shakes, MVI ?--Daily weights ?-- Monitor for refeeding syndrome ?Body mass index is 16.39 kg/m?. ? ? ?Hypernatremia ?Resolved.  Presented  with sodium 149.  Likely due to dehydration in the setting of DKA and free water deficit.    Monitor BMP. ? ?AKI (acute kidney injury) ([place]) ?Presented with creatinine 1.19, mild AKI. ?Prerenal etiology in the setting of dehydration related to DKA.  Creatinine normalized with IV fluids.  Monitor BMP. ? ?Creatinine today 0.63 ? ?Nausea and vomiting ?POA, now resolved.  Suspect due to DKA. ?Zofran as needed ? ?SIRS due to infectious process with acute organ dysfunction ([place]) ?Presented with tachypnea, leukocytosis. ?UA and chest x-ray negative for infection.  Suspect SIRS due to DKA. ?--Stop antibiotics and monitor clinically ?-- Follow-up blood cultures, NGTD ?-- Off IV fluids ? ?Leukocytosis ?Suspect due to hypovolemia.  Patient is afebrile, no leukocytosis, family deny recent signs or symptoms of infection.  Started on broad-spectrum antibiotics on admission. ?-- Stop antibiotics and monitor clinically for signs or symptoms of infection ? ?Paroxysmal atrial fibrillation (H[facility]) ?With DKA, went into with A-fib RVR requiring Cardizem drip.   ?Not on anticoagulation presumed due to falls.  Is on Plavix.   ? ?BP tolerating long-acting Cardizem and rates overall well controlled, elevated at times. ?-- Increase Cardizem CD 180 >>240 mg daily ?-- Monitor on telemetry ?-- Monitor replace electrolytes ? ?Echocardiogram 4/7: EF 60 to 65% without LV regional wall motion abnormalities, indeterminate diastolic parameters, mild to moderate MR, moderate TR ? ?Acute metabolic encephalopathy ?POA, appears resolved.  Likely due to DKA.  Yesterday, patient was lethargic, only opened eyes briefly.  Family report baseline mild dementia with no sundowning type behaviors.  No focal neurologic deficits.  This morning, patient sitting up, awake, talkative able to express needs.  Working with SLP. ?Appears at her baseline mental status. ?--Delirium precautions ?-- Manage underlying conditions as outlined ? ?  Anxiety ?Continue home  sertraline ? ?Type 2 diabetes mellitus ([place]) ?Presented with DKA, suspected due to [place].  Stop Jardiance.  Now on subcutaneous insulin.   ? ?4/9: Hyperglycemia as high as 370 in the past 24 hours, multiple above 200 ? ?-- Continue Semglee 6 units daily ?--Add Novolog 3 units TID ?--Continue sliding scale Novolog ? ? ?Pure hypercholesterolemia ?On statin ? ?Essential hypertension ?Hold amlodipine with soft blood pressures on Cardizem drip.  BPs have improved since. ?4/7 started on short acting oral Cardizem ?-- Transition to Cardizem CD 180 mg daily ? ?History of CVA (cerebrovascular accident) ?On Plavix and statin ? ?Hypercholesterolemia ?Continue statin ? ? ? ? ?  ? ?Subjective: Patient up in recliner eating breakfast this AM.  In good spirits.  She denies any pain, feeling sick or anything bothering her.  No acute events reported.  ? ?Physical Exam: ?Vitals:  ? 10/27/21 0735 10/27/21 1148 10/27/21 1538 10/27/21 1942  ?BP: 132/78 117/61 139/61 (!) 126/53  ?Pulse: 89  98 (!) 44  ?Resp: '18 20 18 19  '$ ?Temp: 97.8 ?F (36.6 ?C) (!) 97.4 ?F (36.3 ?C) (!) 96.7 ?F (35.9 ?C) 98 ?F (36.7 ?C)  ?TempSrc: Oral Axillary    ?SpO2: 98% 96% 100% 92%  ?Weight:      ?Height:      ? ?General exam: awake eating breakfast in recliner, no acute distress, frail  ?Respiratory system: CTAB, no wheezes, rales or rhonchi, normal respiratory effort. ?Cardiovascular system: normal O2/H4, RRR, systolic murmur noted, no pedal edema.   ?Central nervous system: no gross focal deficits, normal speech ?Skin: dry, intact, normal temperature, patchy ecchymosis on bilateral upper extremities ?Psychiatry: normal mood, congruent affect ? ? ?Data Reviewed: ? ?Notable labs: glucose 186, Ca 8.4.   ?CBG's today: 179>>339>>296 ? ?Family Communication: None at bedside.  Family were at bedside on rounds 4/8.   ? ?Disposition: ?Status is: Inpatient ?Remains inpatient appropriate because: Awaiting SNF placement for rehab.   ? ? ? Planned Discharge Destination:  Skilled nursing facility ? ? ? ?Time spent: 35 minutes including time spent at bedside and in coordination of care ? ?Author: ?Ezekiel Slocumb, DO ?10/27/2021 7:57 PM ? ?For on call review [email or web address].  ?

## 2021-10-27 NOTE — Progress Notes (Signed)
Occupational Therapy Treatment ?Patient Details ?Name: Angela Patterson ?MRN: [id number] ?DOB: [birth year] ?Today's Date: 10/27/2021 ? ? ?History of present illness 88 year old female with history of paroxysmal atrial fibrillation, hypertension, history of CVA, dementia, non-insulin-dependent diabetes mellitus, who presents emergency department for chief concerns of nausea and vomiting and altered mental status. ?  ?OT comments ? Upon entering the room, pt seated in recliner chair and finishing breakfast. Pt needing min cuing for safety in order to follow swallowing precautions. Pt does not have hearing aide donned and they are not present in the room this session. Pt is agreeable to OT intervention. She stands with min A with RW and min cuing for technique and hand placement. She has posterior bias in standing and is unable to follow commands for standing marches. When she off loads to take a step she has posterior LOB requiring mod A to correct. Pt taking rest break  and standing 2 additional times in same manner. Pt remains standing for 1-2 minutes each rep. She combs hair and washes face with close supervision for seated balance. Pt requesting to remain in recliner chair and rest. All needs within reach. Chair alarm donned and activated. Pt continues to benefit from OT intervention.   ? ?Recommendations for follow up therapy are one component of a multi-disciplinary discharge planning process, led by the attending physician.  Recommendations may be updated based on patient status, additional functional criteria and insurance authorization. ?   ?Follow Up Recommendations ? Skilled nursing-short term rehab (<3 hours/day)  ?  ?Assistance Recommended at Discharge Frequent or constant Supervision/Assistance  ?Patient can return home with the following ? A lot of help with walking and/or transfers;A lot of help with bathing/dressing/bathroom;Assistance with cooking/housework;Help with stairs or ramp for entrance;Assist for  transportation ?  ?Equipment Recommendations ? Other (comment) (defer to next venue of care)  ?  ?   ?Precautions / Restrictions Precautions ?Precautions: Fall ?Restrictions ?Weight Bearing Restrictions: No  ? ? ?  ? ?Mobility Bed Mobility ?  ?  ?  ?  ?  ?  ?  ?General bed mobility comments: seated in recliner chair ?  ? ?Transfers ?Overall transfer level: Needs assistance ?Equipment used: Rolling walker (2 wheels) ?Transfers: Sit to/from Stand ?Sit to Stand: Min assist ?  ?  ?  ?  ?  ?General transfer comment: min A x 3 reps sit <>stand with use of RW ?  ?  ?Balance Overall balance assessment: Needs assistance ?Sitting-balance support: Feet supported, Bilateral upper extremity supported ?Sitting balance-Leahy Scale: Fair ?  ?  ?Standing balance support: Reliant on assistive device for balance, During functional activity, Bilateral upper extremity supported ?Standing balance-Leahy Scale: Poor ?  ?  ?  ?  ?  ?  ?  ?  ?  ?  ?  ?  ?   ? ?ADL either performed or assessed with clinical judgement  ? ?ADL Overall ADL's : Needs assistance/impaired ?  ?  ?Grooming: Wash/dry hands;Wash/dry face;Sitting;Set up;Supervision/safety;Brushing hair ?  ?  ?  ?  ?  ?  ?  ?  ?  ?  ?  ?  ?  ?  ?  ?  ?  ?  ? ?Extremity/Trunk Assessment Upper Extremity Assessment ?Upper Extremity Assessment: Generalized weakness ?  ?Lower Extremity Assessment ?Lower Extremity Assessment: Generalized weakness ?  ?  ?  ? ?Vision Patient Visual Report: No change from baseline ?  ?  ?   ?   ? ?Cognition Arousal/Alertness:  Awake/alert ?Behavior During Therapy: [facility] for tasks assessed/performed ?Overall Cognitive Status: Within Functional Limits for tasks assessed ?  ?  ?  ?  ?  ?  ?  ?  ?  ?  ?  ?  ?  ?  ?  ?  ?General Comments: Pt is pleasant and cooperative. Slow to process but also very HOH. She follows commands ?  ?  ?   ?   ?   ?   ? ? ?Pertinent Vitals/ Pain       Pain Assessment ?Pain Assessment: Faces ?Faces Pain Scale: No hurt ? ?   ?   ? ?Frequency ?  Min 2X/week  ? ? ? ? ?  ?Progress Toward Goals ? ?OT Goals(current goals can now be found in the care plan section) ? Progress towards OT goals: Progressing toward goals ? ?Acute Rehab OT Goals ?Patient Stated Goal: to get stronger ?OT Goal Formulation: With patient ?Time For Goal Achievement: 11/07/21 ?Potential to Achieve Goals: Good  ?Plan Discharge plan remains appropriate;Frequency remains appropriate   ? ?   ?AM-PAC OT "6 Clicks" Daily Activity     ?Outcome Measure ? ? Help from another person eating meals?: None ?Help from another person taking care of personal grooming?: A Little ?Help from another person toileting, which includes using toliet, bedpan, or urinal?: A Lot ?Help from another person bathing (including washing, rinsing, drying)?: A Lot ?Help from another person to put on and taking off regular upper body clothing?: A Little ?Help from another person to put on and taking off regular lower body clothing?: A Lot ?6 Click Score: 16 ? ?  ?End of Session Equipment Utilized During Treatment: Rolling walker (2 wheels) ? ?OT Visit Diagnosis: Unsteadiness on feet (R26.81);Muscle weakness (generalized) (M62.81) ?  ?Activity Tolerance Patient limited by fatigue ?  ?Patient Left in chair;with call bell/phone within reach;with chair alarm set;with family/visitor present ?  ?Nurse Communication Mobility status ?  ? ?   ? ?Time: 4008-6761 ?OT Time Calculation (min): 18 min ? ?Charges: OT General Charges ?$OT Visit: 1 Visit ?OT Treatments ?$Therapeutic Activity: 8-22 mins ? ?Darleen Crocker, MS, OTR/L , CBIS ?ascom [phone]  ?10/27/21, 3:18 PM  ?

## 2021-10-27 NOTE — Progress Notes (Signed)
Physical Therapy Treatment ?Patient Details ?Name: Angela Patterson ?MRN: [id number] ?DOB: 21-Sep-1920 ?Today's Date: 10/27/2021 ? ? ?History of Present Illness 88 year old female with history of paroxysmal atrial fibrillation, hypertension, history of CVA, dementia, non-insulin-dependent diabetes mellitus, who presents emergency department for chief concerns of nausea and vomiting and altered mental status. ? ?  ?PT Comments  ? ? Patient alert, HOH but agreeable and pleasant throughout. She was able to perform supine to sit with CGA and bed rails, but required min-modA throughout most of sitting and medicine administration with nursing, occasional CGA to remain balance. She transferred to recliner with minA and RW, some improvement in patient management of RW but still unsteady. She was able to perform several more sit <> stands with min-modA, and ambulated 20f twice. Very decreased step height/length, extended time, pt with intermittent buckling, and posterior lean, but motivated to walk. Pt up in chair with posey chair belt alarm in place, set up for breakfast with RN student at bedside. The patient would benefit from further skilled PT intervention to continue to progress towards goals. Recommendation remains appropriate.  ? ? ? ?   ?Recommendations for follow up therapy are one component of a multi-disciplinary discharge planning process, led by the attending physician.  Recommendations may be updated based on patient status, additional functional criteria and insurance authorization. ? ?Follow Up Recommendations ? Skilled nursing-short term rehab (<3 hours/day) ?  ?  ?Assistance Recommended at Discharge    ?Patient can return home with the following A lot of help with walking and/or transfers;A lot of help with bathing/dressing/bathroom;Assist for transportation;Direct supervision/assist for financial management;Assistance with cooking/housework;Help with stairs or ramp for entrance;Direct supervision/assist for  medications management ?  ?Equipment Recommendations ? None recommended by PT  ?  ?Recommendations for Other Services   ? ? ?  ?Precautions / Restrictions Precautions ?Precautions: Fall ?Restrictions ?Weight Bearing Restrictions: No  ?  ? ?Mobility ? Bed Mobility ?Overal bed mobility: Needs Assistance ?Bed Mobility: Supine to Sit ?  ?  ?Supine to sit: Min guard ?  ?  ?General bed mobility comments: reliant on bed rails to come up into sitting ?  ? ?Transfers ?Overall transfer level: Needs assistance ?Equipment used: Rolling walker (2 wheels) ?Transfers: Sit to/from Stand, Bed to chair/wheelchair/BSC ?Sit to Stand: Min assist ?Stand pivot transfers: Min assist ?  ?  ?  ?  ?General transfer comment: some improvement in patients ability to manage RW, but ultimately still minA due to unsteadiness ?  ? ?Ambulation/Gait ?  ?Gait Distance (Feet):  (574f twice) ?Assistive device: Rolling walker (2 wheels) ?  ?  ?  ?  ?General Gait Details: very decreased step height/length, extended time, pt with intermittent buckling, and posterior lean ? ? ?Stairs ?  ?  ?  ?  ?  ? ? ?Wheelchair Mobility ?  ? ?Modified Rankin (Stroke Patients Only) ?  ? ? ?  ?Balance Overall balance assessment: Needs assistance ?Sitting-balance support: Feet supported, Bilateral upper extremity supported ?Sitting balance-Leahy Scale: Poor ?Sitting balance - Comments: min-modA for most of sitting, occasional CGA. Pt sat EOB for several minutes to medication administration from staff ?  ?Standing balance support: Reliant on assistive device for balance, During functional activity, Bilateral upper extremity supported ?Standing balance-Leahy Scale: Poor ?  ?  ?  ?  ?  ?  ?  ?  ?  ?  ?  ?  ?  ? ?  ?Cognition Arousal/Alertness: Awake/alert ?Behavior [facility]: [facility]  assessed/performed ?Overall Cognitive Status: Within Functional Limits for tasks assessed ?  ?  ?  ?  ?  ?  ?  ?  ?  ?  ?  ?  ?  ?  ?  ?  ?General Comments: Pt is pleasant and  cooperative. Slow to process but also very HOH. She follows commands ?  ?  ? ?  ?Exercises   ? ?  ?General Comments   ?  ?  ? ?Pertinent Vitals/Pain Pain Assessment ?Pain Assessment: Faces ?Faces Pain Scale: No hurt  ? ? ?Home Living   ?  ?  ?  ?  ?  ?  ?  ?  ?  ?   ?  ?Prior Function    ?  ?  ?   ? ?PT Goals (current goals can now be found in the care plan section) Progress towards PT goals: Progressing toward goals ? ?  ?Frequency ? ? ? Min 2X/week ? ? ? ?  ?PT Plan Current plan remains appropriate  ? ? ?Co-evaluation   ?  ?  ?  ?  ? ?  ?AM-PAC PT "6 Clicks" Mobility   ?Outcome Measure ? Help needed turning from your back to your side while in a flat bed without using bedrails?: A Little ?Help needed moving from lying on your back to sitting on the side of a flat bed without using bedrails?: A Lot ?Help needed moving to and from a bed to a chair (including a wheelchair)?: A Little ?Help needed standing up from a chair using your arms (e.g., wheelchair or bedside chair)?: A Little ?Help needed to walk in hospital room?: A Lot ?Help needed climbing 3-5 steps with a railing? : Total ?6 Click Score: 14 ? ?  ?End of Session Equipment Utilized During Treatment: Gait belt ?Activity Tolerance: Patient tolerated treatment well ?Patient left: in chair;with chair alarm set;with call bell/phone within reach;with nursing/sitter in room (posey chair fast lap alarm) ?Nurse Communication: Mobility status ?PT Visit Diagnosis: Other abnormalities of gait and mobility (R26.89);Difficulty in walking, not elsewhere classified (R26.2);Muscle weakness (generalized) (M62.81) ?  ? ? ?Time: 6546-5035 ?PT Time Calculation (min) (ACUTE ONLY): 27 min ? ?Charges:  $Therapeutic Activity: 23-37 mins          ?          ? ?Lieutenant Diego PT, DPT ?10:24 AM,10/27/21 ? ? ?

## 2021-10-28 ENCOUNTER — Encounter: Payer: Self-pay | Admitting: Internal Medicine

## 2021-10-28 DIAGNOSIS — L8990 Pressure ulcer of unspecified site, unspecified stage: Secondary | ICD-10-CM | POA: Insufficient documentation

## 2021-10-28 DIAGNOSIS — E1110 Type 2 diabetes mellitus with ketoacidosis without coma: Secondary | ICD-10-CM | POA: Diagnosis not present

## 2021-10-28 LAB — GLUCOSE, CAPILLARY
Glucose-Capillary: 222 mg/dL — ABNORMAL HIGH (ref 70–99)
Glucose-Capillary: 144 mg/dL — ABNORMAL HIGH (ref 70–99)
Glucose-Capillary: 112 mg/dL — ABNORMAL HIGH (ref 70–99)
Glucose-Capillary: 109 mg/dL — ABNORMAL HIGH (ref 70–99)

## 2021-10-28 MED ORDER — INSULIN GLARGINE-YFGN 100 UNIT/ML ~~LOC~~ SOLN
10.0000 [IU] | Freq: Every day | SUBCUTANEOUS | 11 refills | Status: AC
Start: 2021-10-29 — End: ?

## 2021-10-28 MED ORDER — LIDOCAINE 5 % EX PTCH
1.0000 | MEDICATED_PATCH | CUTANEOUS | 0 refills | Status: AC
Start: 2021-10-28 — End: ?

## 2021-10-28 MED ORDER — GLUCERNA SHAKE PO LIQD: 237.0000 mL | Freq: Three times a day (TID) | ORAL | 0 refills | Status: AC

## 2021-10-28 MED ORDER — INSULIN ASPART 100 UNIT/ML IJ SOLN: 0.0000 [IU] | Freq: Three times a day (TID) | INTRAMUSCULAR | 11 refills | Status: AC

## 2021-10-28 MED ORDER — DILTIAZEM HCL ER COATED BEADS 240 MG PO CP24: 240.0000 mg | ORAL_CAPSULE | Freq: Every day | ORAL | Status: AC

## 2021-10-28 MED ORDER — INSULIN GLARGINE-YFGN 100 UNIT/ML ~~LOC~~ SOLN
9.0000 [IU] | Freq: Every day | SUBCUTANEOUS | Status: DC
  Administered 2021-10-28 – 2021-10-30 (×3): 9 [IU] via SUBCUTANEOUS
  Filled 2021-10-28 (×3): qty 0.09

## 2021-10-28 MED ORDER — INSULIN ASPART 100 UNIT/ML IJ SOLN: 4.0000 [IU] | Freq: Three times a day (TID) | INTRAMUSCULAR | 11 refills | Status: DC

## 2021-10-28 MED ORDER — LEVALBUTEROL HCL 0.63 MG/3ML IN NEBU
0.6300 mg | INHALATION_SOLUTION | Freq: Four times a day (QID) | RESPIRATORY_TRACT | Status: DC | PRN
  Administered 2021-10-28: 0.63 mg via RESPIRATORY_TRACT
  Filled 2021-10-28 (×2): qty 3

## 2021-10-28 NOTE — Progress Notes (Signed)
PT Cancellation Note ? ?Patient Details ?Name: Angela Patterson ?MRN: [id number] ?DOB: 1920-11-23 ? ? ?Cancelled Treatment:    Reason Eval/Treat Not Completed: Other (comment). Pt sleeping soundly, PT to re-attempt as able.  ? ?Lieutenant Diego PT, DPT ?10:32 AM,10/28/21 ? ?

## 2021-10-28 NOTE — TOC Transition Note (Deleted)
Transition of Care (TOC) - CM/SW Discharge Note ? ? ?Patient Details  ?Name: Angela Patterson ?MRN: [id number] ?Date of Birth: 12-03-1920 ? ?Transition of Care (TOC) CM/SW Contact:  ?Broden Holt A Carnell Beavers, LCSW ?Phone Number: ?10/28/2021, 1:19 PM ? ? ?Clinical Narrative:   Clinical Social Worker facilitated patient discharge including contacting patient family and facility to confirm patient discharge plans.  Clinical information faxed to facility. CSW attempted to reach pt's daughter several times however, unable to reach her.  CSW arranged ambulance transport via [facility] to [facility] (room 704).  RN to call [phone] for report prior to discharge. ? ? ? ? ? ?Final next level of care: [place] ?Barriers to Discharge: No Barriers Identified ? ? ?Patient Goals and CMS Choice ?Patient states their goals for this hospitalization and ongoing recovery are:: to go home ?CMS Medicare.gov Compare Post Acute Care list provided to:: Patient Represenative (must comment) (daughter [name]) ?Choice offered to / list presented to : Adult Children ? ?Discharge Placement ?  ?           ?Patient chooses bed at:  ([facility]) ?Patient to be transferred to facility by: [facility] ?  ?Patient and family notified of of transfer: 10/28/21 ? ?Discharge Plan and Services ?  ?  ?           ?  ?  ?  ?  ?  ?  ?  ?  ?  ?  ? ?Social Determinants of Health (SDOH) Interventions ?  ? ? ?Readmission Risk Interventions ?   ? View : No data to display.  ?  ?  ?  ? ? ? ? ? ?

## 2021-10-28 NOTE — Progress Notes (Signed)
Mobility Specialist - Progress Note ? ? 10/28/21 1400  ?Mobility  ?Activity Refused mobility  ? ? ? ?Pt sleeping on arrival with daughter at bedside requesting to hold off on mobility at this time d/t pt having a rough morning. Will attempt another date/time.  ? ? ?Kathee Delton ?Mobility Specialist ?10/28/21, 2:10 PM ? ? ? ? ? ?

## 2021-10-28 NOTE — Discharge Summary (Addendum)
?Physician Discharge Summary ?  ?Patient: Angela Patterson MRN: [id number] DOB: [birth year]  ?Admit date:     10/22/2021  ?Discharge date: 10/30/2021  ?Discharge Physician: Shawna Clamp  ? ?PCP: Einar Pheasant, MD  ? ?Recommendations at discharge:  ? ?Follow up with Primary Care in 1 week. ?Follow up on glycemic control and adjust insulin as indicated. ?Jardiance was discontinued, possibly precipitated DKA.  ?Consider alternative therapy or continuing on insulin longer term ?Repeat BMP, Mg, CBC in 1 week ?Palliative care to follow at [facility] after discharge. ? ?Discharge Diagnoses: ?Active Problems: ?  Hypercholesterolemia ?  History of CVA (cerebrovascular accident) ?  Essential hypertension ?  Pure hypercholesterolemia ?  Type 2 diabetes mellitus ([place]) ?  Anxiety ?  Paroxysmal atrial fibrillation (HCC) ?  Protein-calorie malnutrition, severe ?  Pressure injury of skin ? ?Principal Problem (Resolved): ?  DKA (diabetic ketoacidosis) ([place]) ?Resolved Problems: ?  Acute metabolic encephalopathy ?  Leukocytosis ?  SIRS due to infectious process with acute organ dysfunction ([place]) ?  Nausea and vomiting ?  AKI (acute kidney injury) ([place]) ?  Hypernatremia ? ?Hospital Course: ?[name] is a 88 year old female with history of paroxysmal atrial fibrillation, hypertension, history of CVA, dementia, non-insulin-dependent diabetes mellitus, who presents emergency department for chief concerns of nausea and vomiting and altered mental status.  Patient takes Jardiance for her diabetes. ? ?ED course: Afebrile, RR 22, HR 102, BP 160/61 and improved to 150/103, spO2 of 96% on room air.  Labs notable for hypernatremia 149, BUN 56, creatinine 1.19, glucose 287, GFR 41, WBC 14.2, hemoconcentration with Hbg 16.2.  Anion gap elevated at 17, total bili 1.7, urinalysis with ketonuria but no evidence of infection. ? ?Patient admitted with DKA likely secondary to [facility].  Started on insulin infusion.  Once DKA resolved, patient transitioned  to subcutaneous insulin.  Angela Patterson is held and will be discontinued at discharge. ? ?Patient developed A-fib with RVR requiring Cardizem drip.  Blood pressures are soft but MAP stable, continued monitoring patient in ICU another day. ?Started on short-acting oral Cardizem 4/7 with reasonable HR control (variable HR 52-110), BP's stable. ? ? ?Assessment and Plan: ?* DKA (diabetic ketoacidosis) (HCC)-resolved as of 10/28/2021 ?Resolved.  Mild, possibly due to Jardiance, patient has never been on insulin.  Patient presented with anion gap metabolic acidosis, ketonuria.   ?Resolved with insulin infusion. ?Transitioned to subcutaneous insulin. ? ?Protein-calorie malnutrition, severe ?Related to advanced age, mild dementia, social/environmental circumstances, as evidenced by severe fat and muscle depletion. ?-- Dietitian following, appreciate recommendations ?-- Started on Glucerna shakes, MVI ?--Daily weights ? ?Body mass index is 16.39 kg/m?. ? ? ?Paroxysmal atrial fibrillation (HCC) ?With DKA, went into with A-fib RVR requiring Cardizem drip.   ?Not on anticoagulation presumed due to falls.  Is on Plavix.   ? ?Echocardiogram 4/7: EF 60 to 65% without LV regional wall motion abnormalities, indeterminate diastolic parameters, mild to moderate MR, moderate TR ? ?BP tolerating long-acting Cardizem and rates overall well controlled, elevated at times. ?-- Cardizem CD 240 mg daily ? ?Anxiety ?Continue home sertraline ? ?Type 2 diabetes mellitus ([place]) ?Presented with DKA, suspected due to [place].  Stop Jardiance.  Now on subcutaneous insulin.   ? ?CONTINUE: ?-- Semglee 9 units daily ?-- Novolog 3 units TID + Sliding Scale 0-9 units as follows: ? ?Take 0-9 units 3 times daily with meals as follows: ?If CBG 70-120: 0 units ?If CBG 121-150: 1 unit ?If CBG 151-200: 2 units ?If CBG 201-250:  3 units ?If CBG 251-300: 5 units ?If CBG 301-350: 7 units ?If CBG 351-400: 9 units ?If CBG over 400: 12 units and call MD  ? ? ?Pure  hypercholesterolemia ?On statin ? ?Essential hypertension ?Home amlodipine was held on admission with soft blood pressures while on Cardizem drip.  BPs have improved since. ?--Now on Cardizem CD 240 mg daily for BP and HR control ?--Stop amlodipine ? ?History of CVA (cerebrovascular accident) ?On Plavix and statin ? ?Hypercholesterolemia ?Continue statin ? ?Hypernatremia-resolved as of 10/28/2021 ?Resolved.  Presented with sodium 149.  Likely due to dehydration in the setting of DKA and free water deficit.    Monitor BMP. ? ?AKI (acute kidney injury) (HCC)-resolved as of 10/28/2021 ?Resolved. Presented with creatinine 1.19, mild AKI.  Prerenal etiology in the setting of dehydration related to DKA.   ?Creatinine normalized with IV fluids.   ?--Monitor BMP. ? ? ?Nausea and vomiting-resolved as of 10/28/2021 ?POA, now resolved.   ?Suspect due to DKA. ?Zofran as needed. ? ?SIRS due to infectious process with acute organ dysfunction (HCC)-resolved as of 10/28/2021 ?Presented with tachypnea, leukocytosis. ?UA and chest x-ray negative for infection.  Suspect SIRS due to DKA. ?--Stop antibiotics and monitor clinically ?-- Follow-up blood cultures, NGTD ?-- Off IV fluids ? ?Leukocytosis-resolved as of 10/28/2021 ?Suspect due to hypovolemia.  Patient is afebrile, no leukocytosis, family deny recent signs or symptoms of infection.  Started on broad-spectrum antibiotics on admission. ?-- Stop antibiotics and monitor clinically for signs or symptoms of infection ? ?Patient remained clinically stable with out signs of infection. ? ?Acute metabolic encephalopathy-resolved as of 10/28/2021 ?POA, appears resolved.  Likely due to DKA.  Yesterday, patient was lethargic, only opened eyes briefly.  Family report baseline mild dementia with no sundowning type behaviors.  No focal neurologic deficits.  This morning, patient sitting up, awake, talkative able to express needs.  Working with SLP. ?Appears at her baseline mental status. ? ? ?   ? ? ?Consultants: Palliative Care ?Procedures performed: None ?  ?Disposition: Skilled nursing facility ? ?Diet recommendation:  ?Discharge Diet Orders (From admission, onward)  ? ?  Start     Ordered  ? 10/28/21 0000  Diet - low sodium heart healthy       ? 10/28/21 1225  ? ?  ?  ? ?  ? ?Dysphagia type 1 Thin Liquid - Carb Controlled ? ? ?DISCHARGE MEDICATION: ?Allergies as of 10/30/2021   ? ?   Reactions  ? Actos [pioglitazone] Swelling  ? Contrast Media [iodinated Contrast Media] Swelling  ? Prandin [repaglinide] Swelling  ? Pravastatin Sodium   ? cramps  ? ?  ? ?  ?Medication List  ?  ? ?STOP taking these medications   ? ?amLODipine 5 MG tablet ?Commonly known as: NORVASC ?  ?empagliflozin 25 MG Tabs tablet ?Commonly known as: Jardiance ?  ? ?  ? ?TAKE these medications   ? ?acetaminophen 325 MG tablet ?Commonly known as: TYLENOL ?Take 325 mg by mouth every 4 (four) hours as needed for mild pain. ?  ?ascorbic acid 500 MG tablet ?Commonly known as: VITAMIN C ?Take 500 mg by mouth daily. ?  ?clopidogrel 75 MG tablet ?Commonly known as: PLAVIX ?TAKE 1 TABLET(75 MG) BY MOUTH DAILY ?  ?Cranberry 500 MG Caps ?Take 500 mg by mouth daily. ?  ?diltiazem 240 MG 24 hr capsule ?Commonly known as: CARDIZEM CD ?Take 1 capsule (240 mg total) by mouth daily. ?  ?feeding supplement (GLUCERNA SHAKE) Liqd ?Take 237  mLs by mouth 3 (three) times daily between meals. ?  ?insulin aspart 100 UNIT/ML injection ?Commonly known as: novoLOG ?Inject 4 Units into the skin 3 (three) times daily with meals. Hold if less than 50% of meal consumed. ?  ?insulin aspart 100 UNIT/ML injection ?Commonly known as: novoLOG ?Inject 0-9 Units into the skin 3 (three) times daily with meals. Take 0-9 units 3 times daily with meals as follows: ?If CBG 70-120: 0 units ?If CBG 121-150: 1 unit ?If CBG 151-200: 2 units ?If CBG 201-250: 3 units ?If CBG 251-300: 5 units ?If CBG 301-350: 7 units ?If CBG 351-400: 9 units ?If CBG over 400: 12 units and call MD ?   ?insulin glargine-yfgn 100 UNIT/ML injection ?Commonly known as: SEMGLEE ?Inject 0.1 mLs (10 Units total) into the skin daily. ?  ?Krill Oil 1000 MG Caps ?Take 1 capsule by mouth daily. ?  ?lidocaine 5 % ?Commonly

## 2021-10-28 NOTE — TOC Progression Note (Signed)
Transition of Care (TOC) - Progression Note  ? ? ?Patient Details  ?Name: Angela Patterson ?MRN: [id number] ?Date of Birth: 07-23-1920 ? ?Transition of Care (TOC) CM/SW Contact  ?Michaila Kenney A Joelle Flessner, LCSW ?Phone Number: ?10/28/2021, 2:39 PM ? ?Clinical Narrative:   DC canceled. Will plan for dc tomorrow to [facility]. SNF and daughter updated. ? ? ? ?Expected Discharge Plan: [place] ?Barriers to Discharge: No Barriers Identified ? ?Expected Discharge Plan and Services ?Expected Discharge Plan: [place] ?  ?  ?  ?Living arrangements for the past 2 months: [place]) ?Expected Discharge Date: 10/28/21               ?  ?  ?  ?  ?  ?  ?  ?  ?  ?  ? ? ?Social Determinants of Health (SDOH) Interventions ?  ? ?Readmission Risk Interventions ?   ? View : No data to display.  ?  ?  ?  ? ? ?

## 2021-10-29 ENCOUNTER — Inpatient Hospital Stay: Payer: Medicare Other

## 2021-10-29 DIAGNOSIS — E1110 Type 2 diabetes mellitus with ketoacidosis without coma: Secondary | ICD-10-CM | POA: Diagnosis not present

## 2021-10-29 LAB — GLUCOSE, CAPILLARY
Glucose-Capillary: 137 mg/dL — ABNORMAL HIGH (ref 70–99)
Glucose-Capillary: 242 mg/dL — ABNORMAL HIGH (ref 70–99)
Glucose-Capillary: 263 mg/dL — ABNORMAL HIGH (ref 70–99)
Glucose-Capillary: 232 mg/dL — ABNORMAL HIGH (ref 70–99)

## 2021-10-29 NOTE — Progress Notes (Signed)
?PROGRESS NOTE ? ? ? ?Angela Patterson  NFA:[id number] DOB: 1920-10-14 DOA: 10/22/2021 ? ?PCP: Angela Pheasant, MD  ? ? ?Brief Narrative: ?This 88 year old female with medical history significant of paroxysmal atrial fibrillation, hypertension, history of CVA, dementia, non-insulin-dependent diabetes mellitus, who presented in the emergency department for chief concerns of nausea and vomiting and altered mental status.  Patient takes Jardiance for her diabetes. ?Patient admitted for DKA likely secondary to [facility].  Started on insulin infusion.  Once DKA resolved, patient transitioned to subcutaneous insulin.  Angela Patterson is held and will be discontinued at discharge. Patient has developed A-fib with RVR requiring Cardizem drip. Blood pressures are soft but MAP stable, she was continued to be monitored in ICU. ?Started on short-acting oral Cardizem 4/7 with reasonable HR control (variable HR 52-110), BP's stable. ?Patient was discharged yesterday but due to aspiration episode, discharge canceled.  Speech and swallow evaluation completed.  Patient remains medically stable but at high risk for aspiration.  We will continue to observe tonight, do voiding trial. ?3.  Plan: Discharge to [facility] 10/30/2021. ? ?Assessment & Plan: ?  ?Active Problems: ?  Hypercholesterolemia ?  History of CVA (cerebrovascular accident) ?  Essential hypertension ?  Pure hypercholesterolemia ?  Type 2 diabetes mellitus ([place]) ?  Anxiety ?  Paroxysmal atrial fibrillation (HCC) ?  Protein-calorie malnutrition, severe ?  Pressure injury of skin ? ?DKA > resolved. ?Possibly due to [facility], patient has never been on insulin. ?Patient presented with high anion gap metabolic acidosis, ketonuria. ?DKA resolved with IV infusion. ?Transition to subcutaneous insulin. ? ?Protein-calorie malnutrition, severe ?Related to advanced age, mild dementia, social/environmental circumstances, as evidenced by severe fat and muscle depletion. ?Dietitian following, appreciate  recommendations ?Started on Glucerna shakes, MVI ?Daily weights ?  ?Body mass index is 16.39 kg/m?. ? ?Paroxysmal atrial fibrillation: ?Patient went into atrial fibrillation with RVR requiring Cardizem drip in the setting of DKA ?Not on anticoagulation presumed due to falls.  New Plavix. ?2D echo shows LVEF 60 to 65% without regional wall motion abnormalities. ?Continue long-acting Cardizem, heart rate overall well controlled. ? ?Anxiety disorder: ?Continue Zoloft. ? ?Type II diabetes: ?Presented with DKA suspected due to Jardiance. ?Stop Jardiance.  Now on subcutaneous insulin. ?Continue Semglee 9 units daily ?Continue NovoLog 3 units 3 times daily plus sliding scale. ? ?Take 0-9 units 3 times daily with meals as follows: ?If CBG 70-120: 0 units ?If CBG 121-150: 1 unit ?If CBG 151-200: 2 units ?If CBG 201-250: 3 units ?If CBG 251-300: 5 units ?If CBG 301-350: 7 units ?If CBG 351-400: 9 units ?If CBG over 400: 12 units and call MD  ? ?Hypercholesterolemia: ?Continue statins. ? ?Essential hypertension: ?Continue Cardizem CD 240 mg daily ?Amlodipine discontinued. ? ?History of CVA: ?Continue Plavix and statin. ? ?Hypernatremia-resolved as of 10/28/2021 ?Resolved.  Presented with sodium 149.  Likely due to dehydration in the setting of DKA and free water deficit.    Monitor BMP. ?  ?AKI (acute kidney injury) (HCC)-resolved as of 10/28/2021 ?Resolved. Presented with creatinine 1.19, mild AKI.   ?Prerenal etiology in the setting of dehydration related to DKA.   ?Creatinine normalized with IV fluids.   ?--Monitor BMP. ?   ?Nausea and vomiting-resolved as of 10/28/2021 ?POA, now resolved.   ?Suspect due to DKA. ?Zofran as needed. ?  ?SIRS due to infectious process with acute organ dysfunction (HCC)-resolved as of 10/28/2021 ?Presented with tachypnea, leukocytosis. ?UA and chest x-ray negative for infection.  Suspect SIRS due to DKA. ?Stopped  antibiotics and monitor clinically ?-- Follow-up blood cultures, NGTD ?-- Off IV  fluids ?  ?Leukocytosis-resolved as of 10/28/2021 ?Suspect due to hypovolemia.  Patient is afebrile, no leukocytosis, family deny recent signs or symptoms of infection.  Started on broad-spectrum antibiotics on admission. ?-- Stop antibiotics and monitor clinically for signs or symptoms of infection ?  ?Patient remained clinically stable with out signs of infection. ?  ?Acute metabolic encephalopathy-resolved as of 10/28/2021 ?POA, appears resolved.  Likely due to DKA.  Patient was lethargic, only opened eyes briefly on 4/11.  Family reports baseline mild dementia with no sundowning type behaviors. No focal neurologic deficits.  This morning, patient sitting up, awake, talkative able to express needs.  Working with SLP. ?Appears at her baseline mental status. ?  ? ? ?DVT prophylaxis: Lovenox ?Code Status: DNR ?Family Communication: Daughter at bedside ?Disposition Plan:  ?Status is: Inpatient ?Remains inpatient appropriate because: Admitted for DKA went into A-fib requiring IV Cardizem.  Now heart rate is well controlled.  Patient had aspiration event yesterday, remains lethargic.  She is much improved today.  Speech and swallow completed. ?Anticipated discharge to [facility] 10/30/2021.  Voiding trial ? ? ?Consultants:  ?Cardiology ? ?Procedures: Echocardiogram ?Antimicrobials: ?Anti-infectives (From admission, onward)  ? ? Start     Dose/Rate Route Frequency Ordered Stop  ? 10/24/21 2300  vancomycin (VANCOREADY) IVPB 750 mg/150 mL  Status:  Discontinued       ? 750 mg ?150 mL/hr over 60 Minutes Intravenous Every 48 hours 10/23/21 0110 10/23/21 0852  ? 10/23/21 2200  ceFEPIme (MAXIPIME) 2 g in sodium chloride 0.9 % 100 mL IVPB  Status:  Discontinued       ? 2 g ?200 mL/hr over 30 Minutes Intravenous Every 24 hours 10/23/21 0054 10/23/21 0852  ? 10/23/21 0145  vancomycin (VANCOCIN) IVPB 1000 mg/200 mL premix       ? 1,000 mg ?200 mL/hr over 60 Minutes Intravenous  Once 10/23/21 0057 10/23/21 0207  ? 10/22/21 2215  ceFEPIme  (MAXIPIME) 2 g in sodium chloride 0.9 % 100 mL IVPB       ? 2 g ?200 mL/hr over 30 Minutes Intravenous  Once 10/22/21 2205 10/23/21 0100  ? 10/22/21 2215  metroNIDAZOLE (FLAGYL) IVPB 500 mg  Status:  Discontinued       ? 500 mg ?100 mL/hr over 60 Minutes Intravenous Every 12 hours 10/22/21 2205 10/23/21 0852  ? 10/22/21 2215  vancomycin (VANCOREADY) IVPB 1250 mg/250 mL  Status:  Discontinued       ? 1,250 mg ?166.7 mL/hr over 90 Minutes Intravenous  Once 10/22/21 2205 10/23/21 0057  ? ?  ? ?Subjective: ?Patient was seen and examined at bedside.  Overnight events noted. ?Patient has aspiration event yesterday became lethargic.  She is much improved today. ?She is alert, oriented x 2, hard of hearing, having breakfast. ? ?Objective: ?Vitals:  ? 10/29/21 0341 10/29/21 0353 10/29/21 0811 10/29/21 0835  ?BP: (!) 126/97  (!) 155/107 (!) 148/134  ?Pulse: 61  (!) 135 86  ?Resp: '14  16 18  '$ ?Temp: 97.8 ?F (36.6 ?C)  97.6 ?F (36.4 ?C) 98 ?F (36.7 ?C)  ?TempSrc: Oral  Oral   ?SpO2: 98%  93% 95%  ?Weight:  45.1 kg    ?Height:      ? ? ?Intake/Output Summary (Last 24 hours) at 10/29/2021 1044 ?Last data filed at 10/28/2021 1315 ?Gross per 24 hour  ?Intake --  ?Output 1050 ml  ?Net -1050 ml  ? ?Filed  Weights  ? 10/27/21 0432 10/28/21 0500 10/29/21 0353  ?Weight: 45.8 kg 46 kg 45.1 kg  ? ? ?Examination: ? ?General exam: Appears comfortable, not in any acute distress, deconditioned. ?Respiratory system: CTA bilaterally, no wheezing, no crackles, respiratory effort normal. ?Cardiovascular system: S1 & S2 heard, regular rate and rhythm, no murmur.  Gastrointestinal system: Abdomen is soft, nontender, nondistended, BS+ ?Central nervous system: Alert and oriented x 2. No focal neurological deficits. ?Extremities: No edema, no cyanosis, no clubbing. ?Skin: No rashes, lesions or ulcers ?Psychiatry: Judgement and insight appear normal. Mood & affect appropriate.  ? ? ? ?Data Reviewed: I have personally reviewed following labs and imaging  studies ? ?CBC: ?Recent Labs  ?Lab 10/22/21 ?1813  ?WBC 14.2*  ?NEUTROABS 11.2*  ?HGB 16.2*  ?HCT 52.0*  ?MCV 94.2  ?PLT 199  ? ?Basic Metabolic Panel: ?Recent Labs  ?Lab 10/22/21 ?1813 10/22/21 ?2337 10/23/21

## 2021-10-29 NOTE — Progress Notes (Signed)
Physical Therapy Treatment ?Patient Details ?Name: Angela Patterson ?MRN: [id number] ?DOB: 02-14-1921 ?Today's Date: 10/29/2021 ? ? ?History of Present Illness 88 year old female with history of paroxysmal atrial fibrillation, hypertension, history of CVA, dementia, non-insulin-dependent diabetes mellitus, who presents emergency department for chief concerns of nausea and vomiting and altered mental status. ? ?  ?PT Comments  ? ? Patient alert, HOH and willing to participate with therapy, and becomes very thankful and eager to continue mobility. She was able to perform supine to sit with bed rails, CGA, extended time.  Sit <> stand 3-4 times during session, initially minA  but with BUE support progressed to CGA-supervision. Pt ambulated several bouts today with very close chair follow, CGA and RW, improvement noted in gait quality and confidence. Pt up in chair with all needs in reach and chair fast lap belt alarm in place. The patient would benefit from further skilled PT intervention to continue to progress towards goals. Recommendation remains appropriate.  ? ? ?HR and spO2 monitored, HR in 120s-130s with activities, but did recover back to 100-110s with rest. spO2 >90% throughout on room air.  ?  ?Recommendations for follow up therapy are one component of a multi-disciplinary discharge planning process, led by the attending physician.  Recommendations may be updated based on patient status, additional functional criteria and insurance authorization. ? ?Follow Up Recommendations ? Skilled nursing-short term rehab (<3 hours/day) ?  ?  ?Assistance Recommended at Discharge    ?Patient can return home with the following A lot of help with walking and/or transfers;A lot of help with bathing/dressing/bathroom;Assist for transportation;Direct supervision/assist for financial management;Assistance with cooking/housework;Help with stairs or ramp for entrance;Direct supervision/assist for medications management ?  ?Equipment  Recommendations ? None recommended by PT  ?  ?Recommendations for Other Services   ? ? ?  ?Precautions / Restrictions Precautions ?Precautions: Fall ?Restrictions ?Weight Bearing Restrictions: No  ?  ? ?Mobility ? Bed Mobility ?Overal bed mobility: Needs Assistance ?Bed Mobility: Supine to Sit ?  ?  ?Supine to sit: Min guard, HOB elevated ?  ?  ?  ?  ? ?Transfers ?Overall transfer level: Needs assistance ?Equipment used: Rolling walker (2 wheels) ?Transfers: Sit to/from Stand ?Sit to Stand: Min guard, Supervision ?Stand pivot transfers: Min guard ?  ?  ?  ?  ?  ?  ? ?Ambulation/Gait ?Ambulation/Gait assistance: Min guard ?Gait Distance (Feet):  (47f, 364f 2034f?Assistive device: Rolling walker (2 wheels) ?  ?Gait velocity: decreased ?  ?  ?General Gait Details: very close chair follow. improvement in step length, height, and steadiness noted ? ? ?Stairs ?  ?  ?  ?  ?  ? ? ?Wheelchair Mobility ?  ? ?Modified Rankin (Stroke Patients Only) ?  ? ? ?  ?Balance Overall balance assessment: Needs assistance ?Sitting-balance support: Feet supported, Bilateral upper extremity supported ?Sitting balance-Leahy Scale: Fair ?  ?  ?Standing balance support: Reliant on assistive device for balance, During functional activity, Bilateral upper extremity supported ?Standing balance-Leahy Scale: Poor ?  ?  ?  ?  ?  ?  ?  ?  ?  ?  ?  ?  ?  ? ?  ?Cognition Arousal/Alertness: Awake/alert ?Behavior During Therapy: [facility] tasks assessed/performed ?Overall Cognitive Status: Within Functional Limits for tasks assessed ?  ?  ?  ?  ?  ?  ?  ?  ?  ?  ?  ?  ?  ?  ?  ?  ?General Comments:  Pt is pleasant and cooperative. HOH ?  ?  ? ?  ?Exercises   ? ?  ?General Comments   ?  ?  ? ?Pertinent Vitals/Pain Pain Assessment ?Pain Assessment: Faces ?Faces Pain Scale: No hurt  ? ? ?Home Living   ?  ?  ?  ?  ?  ?  ?  ?  ?  ?   ?  ?Prior Function    ?  ?  ?   ? ?PT Goals (current goals can now be found in the care plan section) Progress towards PT  goals: Progressing toward goals ? ?  ?Frequency ? ? ? Min 2X/week ? ? ? ?  ?PT Plan Current plan remains appropriate  ? ? ?Co-evaluation   ?  ?  ?  ?  ? ?  ?AM-PAC PT "6 Clicks" Mobility   ?Outcome Measure ? Help needed turning from your back to your side while in a flat bed without using bedrails?: A Little ?Help needed moving from lying on your back to sitting on the side of a flat bed without using bedrails?: A Little ?Help needed moving to and from a bed to a chair (including a wheelchair)?: A Little ?Help needed standing up from a chair using your arms (e.g., wheelchair or bedside chair)?: A Little ?Help needed to walk in hospital room?: A Little ?Help needed climbing 3-5 steps with a railing? : Total ?6 Click Score: 16 ? ?  ?End of Session Equipment Utilized During Treatment: Gait belt ?Activity Tolerance: Patient tolerated treatment well ?Patient left: in chair;with chair alarm set;with call bell/phone within reach;with nursing/sitter in room ?Nurse Communication: Mobility status ?PT Visit Diagnosis: Other abnormalities of gait and mobility (R26.89);Difficulty in walking, not elsewhere classified (R26.2);Muscle weakness (generalized) (M62.81) ?  ? ? ?Time: 2122-4825 ?PT Time Calculation (min) (ACUTE ONLY): 38 min ? ?Charges:  $Therapeutic Exercise: 23-37 mins ?$Therapeutic Activity: 8-22 mins          ?          ? ?Lieutenant Diego PT, DPT ?12:04 PM,10/29/21 ? ? ?

## 2021-10-29 NOTE — TOC Progression Note (Signed)
Transition of Care (TOC) - Progression Note  ? ? ?Patient Details  ?Name: Angela Patterson ?MRN: [id number] ?Date of Birth: 07-20-1921 ? ?Transition of Care (TOC) CM/SW Contact  ?Laurena Slimmer, RN ?Phone Number: ?10/29/2021, 4:24 PM ? ?Clinical Narrative:    ? ?Called Tina at [facility] to advised of pending d/c 4/13. Left a message. ? ? ?Expected Discharge Plan: [place] ?Barriers to Discharge: No Barriers Identified ? ?Expected Discharge Plan and Services ?Expected Discharge Plan: [place] ?  ?  ?  ?Living arrangements for the past 2 months: [place]) ?Expected Discharge Date: 10/28/21               ?  ?  ?  ?  ?  ?  ?  ?  ?  ?  ? ? ?Social Determinants of Health (SDOH) Interventions ?  ? ?Readmission Risk Interventions ?   ? View : No data to display.  ?  ?  ?  ? ? ?

## 2021-10-29 NOTE — Progress Notes (Signed)
Speech Language Pathology Treatment: Dysphagia  ?Patient Details ?Name: Angela Patterson ?MRN: [id number] ?DOB: Mar 26, 1921 ?Today's Date: 10/29/2021 ?Time: 0321-2248 ?SLP Time Calculation (min) (ACUTE ONLY): 60 min ? ?Assessment / Plan / Recommendation ?Clinical Impression ? Pt seen this morning for ongoing assessment of swallowing. Per report, pt "choked" on her liquid medicine yesterday w/ NSG -- she was not using her Dysphagia drink cup w/ the liquid med. She was drowsy yesterday PM and dinner meal was held in order for pt to rest(planned w/ NSG).  ?This morning, she presented w/ alertness, verbal, but HOH. She followed basic instructions w/ Min-Mod cues. Pt has Baseline Dementia, dysphagia and uses a Provale dysphagia drink cup at Baseline at her facility. Pt is also of advanced age(89). Pt assisted in sitting up but needed full support w/ positioning d/t deconditioning.  ?  ?Pt appears to present w/ min, generalized oropharyngeal phase dysphagia in setting of declined Cognitive status, Baseline Dementia and in light of advanced age(89) w/ ongoing acute illness and hospitalization. Pt's Daughter also endorsed issues of Reflux as noted by at a previous Rehab admit. Any Cognitive decline and illness in the advanced ages can impact overall awareness/timing of swallow and safety during po tasks which increases risk for aspiration, choking. Pt's generalized dysphagia and risk for aspiration is present, but it appears reduced when following general aspiration precautions and using a modified diet consistency (puree) w/ her Provale dysphagia drink cup as has been recommended(and is her Baseline). She required Min+ verbal/visual cues for during po tasks.  Pt is HOH and easily fatigued w/ tasks.  ?  ?Pt consumed her Breakfast meal of purees and thin liquids in her Provale Dysphagia Cup w/ No overt clinical s/s of aspiration noted; No coughing, no decline in vocal quality; and no decline in respiratory status during/post trials  -- O2 sats remained 98% when assessed. Pt utilized single sips not overfilling her mouth w/ large sips. Oral phase was adequate for bolus management and oral clearing of the boluses given; timely bolus acceptance, A-P transfer, and swallowing to clear. Pt self-fed by holding her own Cup and used utensil to feed self foods. Holding own Cup when drinking improves safety of swallowing.  ? ?  ?D/t pt's Baseline, declined Cognitive status w/ risk for aspiration currently in setting of acute illness/hospitalization/advanced age/Reflux, and her decreased STAMINA w/ tasks, recommend continue the dysphagia level 1(puree) w/ Thin liquids via using her Provale dysphagia drink cup; general aspiration precautions; reduce Distractions during meals and engage pt during po's at meal for self-feeding. Pills Crushed in Puree for safer swallowing. Support w/ feeding at meals as needed. MD/NSG updated.  ?Pt has Palliative Care ongoing- recommend continued education re: impact of Cognitive decline/Dementia on swallowing and oral intake in general. ST services can follow pt at discharge at her facility for further upgrade of diet when the time is appropriate(when she has improved Stamina for tasks) and for education as needed -- pt appears close to/at her Baseline from a dysphagia standpoint. Precautions posted in room.  ?Thorough education given to Daughter who arrived; also NSG in charge of pt today. Education included on the above session, continuing this diet (food) consistency to best support pt's nutrition safely, and the need to use her Dysphagia Drink Cup w/ any liquid meds -- pt is currently taking an oral med for possible Thrush per NSG. This will need to be followed at Discharge if still on the liquid medication; Daughter updated and agreed. Handouts given to Daughter  last session.  ? ? ?  ?HPI HPI: Pt is a 88 year old female with history of Multiple medical issues including Dementia, dysphagia - uses a dysphagia drink cup w/  thin liquids for controlled flow, DKA, paroxysmal atrial fibrillation, hypertension, CVA, non-insulin-dependent diabetes mellitus, HOH, anxiety, severe malnutrition and HLD who is admitted with DKA. ?  ?   ?SLP Plan ? All goals met ? ?  ?  ?Recommendations for follow up therapy are one component of a multi-disciplinary discharge planning process, led by the attending physician.  Recommendations may be updated based on patient status, additional functional criteria and insurance authorization. ?  ? ?Recommendations  ?Diet recommendations: Dysphagia 1 (puree);Thin liquid ?Liquids provided via: Cup;No straw (her own Dysphagia Cup) ?Medication Administration: Crushed with puree ?Supervision: Patient able to self feed;Intermittent supervision to cue for compensatory strategies (setup support) ?Compensations: Slow rate;Small sips/bites;Minimize environmental distractions;Lingual sweep for clearance of pocketing;Follow solids with liquid ?Postural Changes and/or Swallow Maneuvers: Out of bed for meals;Seated upright 90 degrees;Upright 30-60 min after meal  ?   ?    ?   ? ? ? ? General recommendations:  (Dietician f/u; Paliative Care f/u) ?Oral Care Recommendations: Oral care BID;Oral care before and after PO;Staff/trained caregiver to provide oral care ?Follow Up Recommendations: Follow physician's recommendations for discharge plan and follow up therapies (if needed for diet upgrade (food consistency) in the next 2-3 weeks) ?Assistance recommended at discharge: Intermittent Supervision/Assistance ?SLP Visit Diagnosis: Dysphagia, oropharyngeal phase (R13.12) (suspect related to Baseline Cognitive decline/Dementia; advanced age) ?Plan: All goals met ? ? ? ? ?  ?  ? ? ? ? ? ?Angela Kenner, MS, CCC-SLP ?Speech Language Pathologist ?Rehab Services; [place] ?[phone] (ascom) ?Ardean Melroy ? ?10/29/2021, 4:40 PM ?

## 2021-10-29 NOTE — Progress Notes (Signed)
Mobility Specialist - Progress Note ? ? 10/29/21 1632  ?Mobility  ?Activity Refused mobility  ? ? ? ?Pt declined mobility, no reason specified. Pt reporting just returning to bed from chair. Does seem a little confused on arrival. Pt called out to desk at time of entry and stated "I was calling out for help to get back to bed, but I am in bed". Also believes it to be early/late morning. Reoriented to time. Pt left in bed with needs in reach.  ? ? ?Kathee Delton ?Mobility Specialist ?10/29/21, 4:34 PM ? ? ? ? ?

## 2021-10-29 NOTE — Progress Notes (Signed)
Occupational Therapy Treatment ?Patient Details ?Name: Angela Patterson ?MRN: [id number] ?DOB: [birth year] ?Today's Date: 10/29/2021 ? ? ?History of present illness 88 year old female with history of paroxysmal atrial fibrillation, hypertension, history of CVA, dementia, non-insulin-dependent diabetes mellitus, who presents emergency department for chief concerns of nausea and vomiting and altered mental status. ?  ?OT comments ? Pt seen for OT treatment on this date. Upon arrival to room, pt asleep in bed however easily awoken. Pt reporting no pain and agreeable to OT tx. Pt required MIN GUARD for supine>sit transfer. Once seated EOB, pt demonstrated FAIR static sitting balance, however required intermittent MIN A for dynamic sitting balance during seated LB dressing. Pt performed x1 sit>stand transfer requiring MIN A. Once upright, pt reporting urge to urinate. Pt walked to bathroom and performed toilet transfer requiring MIN A d/t decreased balance, strength, and activity tolerance. Pt did not void (unsure if d/t to difficulty urinating or decreased sustained attention). After walking back to bed, pt reported fatigue and requesting to sleep. Pt required MOD A for sit>supine transfer and was left sitting upright in bed with all needs within reach. Pt is making good progress toward goals. Pt continues to benefit from skilled OT services to maximize return to PLOF and minimize risk of future falls, injury, caregiver burden, and readmission. Will continue to follow POC. Discharge recommendation remains appropriate.    ? ?Recommendations for follow up therapy are one component of a multi-disciplinary discharge planning process, led by the attending physician.  Recommendations may be updated based on patient status, additional functional criteria and insurance authorization. ?   ?Follow Up Recommendations ? Skilled nursing-short term rehab (<3 hours/day)  ?  ?Assistance Recommended at Discharge Frequent or constant  Supervision/Assistance  ?Patient can return home with the following ? A lot of help with bathing/dressing/bathroom;Assistance with cooking/housework;Help with stairs or ramp for entrance;Assist for transportation;A little help with walking and/or transfers ?  ?Equipment Recommendations ? Other (comment) (defer to next venue of care)  ?  ?   ?Precautions / Restrictions Precautions ?Precautions: Fall ?Restrictions ?Weight Bearing Restrictions: No  ? ? ?  ? ?Mobility Bed Mobility ?Overal bed mobility: Needs Assistance ?Bed Mobility: Supine to Sit, Sit to Supine ?  ?  ?Supine to sit: Min guard, HOB elevated ?Sit to supine: Mod assist ?  ?General bed mobility comments: MOD A for managing b/l LE during sit>supine ?  ? ?Transfers ?Overall transfer level: Needs assistance ?Equipment used: Rolling walker (2 wheels) ?Transfers: Sit to/from Stand ?Sit to Stand: Min assist ?  ?  ?  ?  ?  ?  ?  ?  ?Balance Overall balance assessment: Needs assistance ?Sitting-balance support: No upper extremity supported, Feet unsupported ?Sitting balance-Leahy Scale: Poor ?Sitting balance - Comments: Requires SUPERVISION for static sitting balance, and requires MIN A for dynamic sitting balance during LB dressing ?Postural control: Posterior lean, Left lateral lean ?Standing balance support: Reliant on assistive device for balance, During functional activity, Bilateral upper extremity supported ?Standing balance-Leahy Scale: Poor ?Standing balance comment: Requires MIN A for functional mobility of short household distances ?  ?  ?  ?  ?  ?  ?  ?  ?  ?  ?  ?   ? ?ADL either performed or assessed with clinical judgement  ? ?ADL Overall ADL's : Needs assistance/impaired ?  ?  ?  ?  ?  ?  ?  ?  ?  ?  ?Lower Body Dressing: Minimal assistance;Sitting/lateral leans ?Lower Body  Dressing Details (indicate cue type and reason): Requires MIN A for balance while donning/doffing socks sitting EOB ?Toilet Transfer: Minimal  assistance;Ambulation;BSC/3in1;Rolling walker (2 wheels) (BSC frame over toilet) ?  ?  ?  ?  ?  ?Functional mobility during ADLs: Minimal assistance;Rolling walker (2 wheels) ?  ?  ? ? ? ?Cognition Arousal/Alertness: Awake/alert ?Behavior During Therapy: [facility] for tasks assessed/performed ?Overall Cognitive Status: History of cognitive impairments - at baseline ?  ?  ?  ?  ?  ?  ?  ?  ?  ?  ?  ?  ?  ?  ?  ?  ?General Comments: Pt alert and oriented to self and place. Pleasant and agreeable throughout. HOH ?  ?  ?   ?   ?   ?   ? ? ?Pertinent Vitals/ Pain       Pain Assessment ?Pain Assessment: No/denies pain ? ?   ?   ? ?Frequency ? Min 2X/week  ? ? ? ? ?  ?Progress Toward Goals ? ?OT Goals(current goals can now be found in the care plan section) ? Progress towards OT goals: Progressing toward goals ? ?Acute Rehab OT Goals ?Patient Stated Goal: to get stronger ?OT Goal Formulation: With patient ?Time For Goal Achievement: 11/07/21 ?Potential to Achieve Goals: Good  ?Plan Discharge plan remains appropriate;Frequency remains appropriate   ? ?   ?AM-PAC OT "6 Clicks" Daily Activity     ?Outcome Measure ? ? Help from another person eating meals?: None ?Help from another person taking care of personal grooming?: A Little ?Help from another person toileting, which includes using toliet, bedpan, or urinal?: A Little ?Help from another person bathing (including washing, rinsing, drying)?: A Lot ?Help from another person to put on and taking off regular upper body clothing?: A Little ?Help from another person to put on and taking off regular lower body clothing?: A Lot ?6 Click Score: 17 ? ?  ?End of Session Equipment Utilized During Treatment: Rolling walker (2 wheels) ? ?OT Visit Diagnosis: Unsteadiness on feet (R26.81);Muscle weakness (generalized) (M62.81) ?  ?Activity Tolerance Patient tolerated treatment well ?  ?Patient Left in bed;with call bell/phone within reach;with bed alarm set ?  ?Nurse Communication Mobility  status ?  ? ?   ? ?Time: 7116-5790 ?OT Time Calculation (min): 15 min ? ?Charges: OT General Charges ?$OT Visit: 1 Visit ?OT Treatments ?$Self Care/Home Management : 8-22 mins ? ?Fredirick Maudlin, OTR/L ?[place] ? ?

## 2021-10-30 DIAGNOSIS — R1111 Vomiting without nausea: Secondary | ICD-10-CM | POA: Diagnosis not present

## 2021-10-30 DIAGNOSIS — E1165 Type 2 diabetes mellitus with hyperglycemia: Secondary | ICD-10-CM | POA: Diagnosis not present

## 2021-10-30 DIAGNOSIS — R112 Nausea with vomiting, unspecified: Secondary | ICD-10-CM | POA: Diagnosis not present

## 2021-10-30 DIAGNOSIS — R739 Hyperglycemia, unspecified: Secondary | ICD-10-CM | POA: Diagnosis not present

## 2021-10-30 DIAGNOSIS — L89321 Pressure ulcer of left buttock, stage 1: Secondary | ICD-10-CM | POA: Diagnosis not present

## 2021-10-30 DIAGNOSIS — R531 Weakness: Secondary | ICD-10-CM | POA: Diagnosis not present

## 2021-10-30 DIAGNOSIS — Z515 Encounter for palliative care: Secondary | ICD-10-CM | POA: Diagnosis not present

## 2021-10-30 DIAGNOSIS — R4182 Altered mental status, unspecified: Secondary | ICD-10-CM | POA: Diagnosis not present

## 2021-10-30 DIAGNOSIS — M6259 Muscle wasting and atrophy, not elsewhere classified, multiple sites: Secondary | ICD-10-CM | POA: Diagnosis not present

## 2021-10-30 DIAGNOSIS — M542 Cervicalgia: Secondary | ICD-10-CM | POA: Diagnosis not present

## 2021-10-30 DIAGNOSIS — E871 Hypo-osmolality and hyponatremia: Secondary | ICD-10-CM | POA: Diagnosis not present

## 2021-10-30 DIAGNOSIS — R2681 Unsteadiness on feet: Secondary | ICD-10-CM | POA: Diagnosis not present

## 2021-10-30 DIAGNOSIS — R41841 Cognitive communication deficit: Secondary | ICD-10-CM | POA: Diagnosis not present

## 2021-10-30 DIAGNOSIS — I4892 Unspecified atrial flutter: Secondary | ICD-10-CM | POA: Diagnosis not present

## 2021-10-30 DIAGNOSIS — I4891 Unspecified atrial fibrillation: Secondary | ICD-10-CM | POA: Diagnosis not present

## 2021-10-30 DIAGNOSIS — R5383 Other fatigue: Secondary | ICD-10-CM | POA: Diagnosis not present

## 2021-10-30 DIAGNOSIS — M6281 Muscle weakness (generalized): Secondary | ICD-10-CM | POA: Diagnosis not present

## 2021-10-30 DIAGNOSIS — E7800 Pure hypercholesterolemia, unspecified: Secondary | ICD-10-CM | POA: Diagnosis not present

## 2021-10-30 DIAGNOSIS — Z7984 Long term (current) use of oral hypoglycemic drugs: Secondary | ICD-10-CM | POA: Diagnosis not present

## 2021-10-30 DIAGNOSIS — Z7401 Bed confinement status: Secondary | ICD-10-CM | POA: Diagnosis not present

## 2021-10-30 DIAGNOSIS — R110 Nausea: Secondary | ICD-10-CM | POA: Diagnosis not present

## 2021-10-30 DIAGNOSIS — R404 Transient alteration of awareness: Secondary | ICD-10-CM | POA: Diagnosis not present

## 2021-10-30 DIAGNOSIS — R109 Unspecified abdominal pain: Secondary | ICD-10-CM | POA: Diagnosis not present

## 2021-10-30 DIAGNOSIS — R000 Tachycardia, unspecified: Secondary | ICD-10-CM | POA: Diagnosis not present

## 2021-10-30 DIAGNOSIS — E1110 Type 2 diabetes mellitus with ketoacidosis without coma: Secondary | ICD-10-CM | POA: Diagnosis not present

## 2021-10-30 DIAGNOSIS — G9341 Metabolic encephalopathy: Secondary | ICD-10-CM | POA: Diagnosis not present

## 2021-10-30 DIAGNOSIS — R1311 Dysphagia, oral phase: Secondary | ICD-10-CM | POA: Diagnosis not present

## 2021-10-30 DIAGNOSIS — R9431 Abnormal electrocardiogram [ECG] [EKG]: Secondary | ICD-10-CM | POA: Diagnosis not present

## 2021-10-30 DIAGNOSIS — R627 Adult failure to thrive: Secondary | ICD-10-CM | POA: Diagnosis not present

## 2021-10-30 DIAGNOSIS — Z66 Do not resuscitate: Secondary | ICD-10-CM | POA: Diagnosis not present

## 2021-10-30 DIAGNOSIS — I959 Hypotension, unspecified: Secondary | ICD-10-CM | POA: Diagnosis not present

## 2021-10-30 DIAGNOSIS — E1169 Type 2 diabetes mellitus with other specified complication: Secondary | ICD-10-CM | POA: Diagnosis not present

## 2021-10-30 DIAGNOSIS — D696 Thrombocytopenia, unspecified: Secondary | ICD-10-CM | POA: Diagnosis not present

## 2021-10-30 DIAGNOSIS — R1312 Dysphagia, oropharyngeal phase: Secondary | ICD-10-CM | POA: Diagnosis not present

## 2021-10-30 DIAGNOSIS — H9190 Unspecified hearing loss, unspecified ear: Secondary | ICD-10-CM | POA: Diagnosis not present

## 2021-10-30 DIAGNOSIS — I2510 Atherosclerotic heart disease of native coronary artery without angina pectoris: Secondary | ICD-10-CM | POA: Diagnosis not present

## 2021-10-30 DIAGNOSIS — Z681 Body mass index (BMI) 19 or less, adult: Secondary | ICD-10-CM | POA: Diagnosis not present

## 2021-10-30 DIAGNOSIS — F0394 Unspecified dementia, unspecified severity, with anxiety: Secondary | ICD-10-CM | POA: Diagnosis not present

## 2021-10-30 DIAGNOSIS — I4510 Unspecified right bundle-branch block: Secondary | ICD-10-CM | POA: Diagnosis not present

## 2021-10-30 DIAGNOSIS — R079 Chest pain, unspecified: Secondary | ICD-10-CM | POA: Diagnosis not present

## 2021-10-30 DIAGNOSIS — E43 Unspecified severe protein-calorie malnutrition: Secondary | ICD-10-CM | POA: Diagnosis not present

## 2021-10-30 DIAGNOSIS — I10 Essential (primary) hypertension: Secondary | ICD-10-CM | POA: Diagnosis not present

## 2021-10-30 DIAGNOSIS — D72829 Elevated white blood cell count, unspecified: Secondary | ICD-10-CM | POA: Diagnosis not present

## 2021-10-30 DIAGNOSIS — F3289 Other specified depressive episodes: Secondary | ICD-10-CM | POA: Diagnosis not present

## 2021-10-30 DIAGNOSIS — E782 Mixed hyperlipidemia: Secondary | ICD-10-CM | POA: Diagnosis not present

## 2021-10-30 DIAGNOSIS — I480 Paroxysmal atrial fibrillation: Secondary | ICD-10-CM | POA: Diagnosis not present

## 2021-10-30 DIAGNOSIS — R0681 Apnea, not elsewhere classified: Secondary | ICD-10-CM | POA: Diagnosis not present

## 2021-10-30 DIAGNOSIS — E440 Moderate protein-calorie malnutrition: Secondary | ICD-10-CM | POA: Diagnosis not present

## 2021-10-30 DIAGNOSIS — Z8673 Personal history of transient ischemic attack (TIA), and cerebral infarction without residual deficits: Secondary | ICD-10-CM | POA: Diagnosis not present

## 2021-10-30 DIAGNOSIS — Z741 Need for assistance with personal care: Secondary | ICD-10-CM | POA: Diagnosis not present

## 2021-10-30 DIAGNOSIS — E785 Hyperlipidemia, unspecified: Secondary | ICD-10-CM | POA: Diagnosis not present

## 2021-10-30 LAB — BASIC METABOLIC PANEL
Anion gap: 6 (ref 5–15)
BUN: 21 mg/dL (ref 8–23)
CO2: 29 mmol/L (ref 22–32)
Calcium: 8.6 mg/dL — ABNORMAL LOW (ref 8.9–10.3)
Chloride: 98 mmol/L (ref 98–111)
Creatinine, Ser: 0.55 mg/dL (ref 0.44–1.00)
GFR, Estimated: >60 mL/min (ref >60)
Glucose, Bld: 180 mg/dL — ABNORMAL HIGH (ref 70–99)
Potassium: 4.2 mmol/L (ref 3.5–5.1)
Sodium: 133 mmol/L — ABNORMAL LOW (ref 135–145)

## 2021-10-30 LAB — PHOSPHORUS: Phosphorus: 3.3 mg/dL (ref 2.5–4.6)

## 2021-10-30 LAB — CBC
HCT: 41.5 % (ref 36.0–46.0)
Hemoglobin: 13.4 g/dL (ref 12.0–15.0)
MCH: 29.9 pg (ref 26.0–34.0)
MCHC: 32.3 g/dL (ref 30.0–36.0)
MCV: 92.6 fL (ref 80.0–100.0)
Platelets: 102 10*3/uL — ABNORMAL LOW (ref 150–400)
RBC: 4.48 MIL/uL (ref 3.87–5.11)
RDW: 13.1 % (ref 11.5–15.5)
WBC: 6.8 10*3/uL (ref 4.0–10.5)
nRBC: 0 % (ref 0.0–0.2)

## 2021-10-30 LAB — GLUCOSE, CAPILLARY
Glucose-Capillary: 204 mg/dL — ABNORMAL HIGH (ref 70–99)
Glucose-Capillary: 285 mg/dL — ABNORMAL HIGH (ref 70–99)

## 2021-10-30 LAB — MAGNESIUM: Magnesium: 2 mg/dL (ref 1.7–2.4)

## 2021-10-30 NOTE — Progress Notes (Signed)
Mobility Specialist - Progress Note ? ? 10/30/21 1200  ?Mobility  ?Activity Transferred to/from [facility]  ?Level of Assistance Standby assist, set-up cues, supervision of patient - no hands on  ?Assistive Device Front wheel walker  ?Distance Ambulated (ft) 4 ft  ?Activity Response Tolerated well  ?$Mobility charge 1 Mobility  ? ? ? ?Pt transferred to/from [facility] for urinal output. No complaints. VC for hand placement. Pt left semi-supine in bed with lunch tray set up. Daughter at bedside.  ? ? ?Kathee Delton ?Mobility Specialist ?10/30/21, 12:42 PM ? ? ? ? ?

## 2021-10-30 NOTE — Progress Notes (Signed)
?PROGRESS NOTE ? ? ? ?Angela Patterson  TWS:[id number] DOB: [birth year] DOA: 10/22/2021 ? ?PCP: Einar Pheasant, MD  ? ? ?Brief Narrative: ?This 88 year old female with medical history significant of paroxysmal atrial fibrillation, hypertension, history of CVA, dementia, non-insulin-dependent diabetes mellitus, who presented in the emergency department for chief concerns of nausea and vomiting and altered mental status.  Patient takes Jardiance for her diabetes. ?Patient admitted for DKA likely secondary to [facility].  Started on insulin infusion.  Once DKA resolved, patient transitioned to subcutaneous insulin.  [name] is held and will be discontinued at discharge. Patient has developed A-fib with RVR requiring Cardizem drip. Blood pressures are soft but MAP stable, she was continued to be monitored in ICU. ?Started on short-acting oral Cardizem 4/7 with reasonable HR control (variable HR 52-110), BP's stable. ?Patient was discharged 04/11 but due to aspiration episode, discharge canceled.  Speech and swallow evaluation completed.  Patient remains medically stable but at high risk for aspiration. She was kept under observation, she has successfully voided.  ?Plan: Discharge to [facility] 10/30/2021. ? ?Assessment & Plan: ?  ?Active Problems: ?  Hypercholesterolemia ?  History of CVA (cerebrovascular accident) ?  Essential hypertension ?  Pure hypercholesterolemia ?  Type 2 diabetes mellitus ([place]) ?  Anxiety ?  Paroxysmal atrial fibrillation (HCC) ?  Protein-calorie malnutrition, severe ?  Pressure injury of skin ? ?DKA > resolved. ?Possibly due to [facility], patient has never been on insulin. ?Patient presented with high anion gap metabolic acidosis, ketonuria. ?DKA resolved with IV insulin and fluids. ?Transitioned to subcutaneous insulin. ? ?Protein-calorie malnutrition, severe ?Related to advanced age, mild dementia, social/environmental circumstances, as evidenced by severe fat and muscle depletion. ?Dietitian following,  appreciate recommendations ?Started on Glucerna shakes, MVI ?Daily weights ?  ?Body mass index is 16.39 kg/m?. ? ?Paroxysmal atrial fibrillation: ?Patient went into atrial fibrillation with RVR requiring Cardizem drip in the setting of DKA ?Not on anticoagulation presumed due to falls.  Continue Plavix. ?2D echo shows LVEF 60 to 65% without regional wall motion abnormalities. ?Continue long-acting Cardizem, heart rate overall well controlled. ? ?Anxiety disorder: ?Continue Zoloft. ? ?Type II diabetes: ?Presented with DKA suspected due to Jardiance. ?Stop Jardiance.  Now on subcutaneous insulin. ?Continue Semglee 9 units daily ?Continue NovoLog 3 units 3 times daily plus sliding scale. ? ?Take 0-9 units 3 times daily with meals as follows: ?If CBG 70-120: 0 units ?If CBG 121-150: 1 unit ?If CBG 151-200: 2 units ?If CBG 201-250: 3 units ?If CBG 251-300: 5 units ?If CBG 301-350: 7 units ?If CBG 351-400: 9 units ?If CBG over 400: 12 units and call MD  ? ?Hypercholesterolemia: ?Continue statins. ? ?Essential hypertension: ?Continue Cardizem CD 240 mg daily ?Amlodipine discontinued. ? ?History of CVA: ?Continue Plavix and statin. ? ?Hypernatremia-resolved as of 10/28/2021 ?Resolved.  Presented with sodium 149.  Likely due to dehydration in the setting of DKA and free water deficit.    Monitor BMP. ?  ?AKI (acute kidney injury) (HCC)-resolved as of 10/28/2021 ?Resolved. Presented with creatinine 1.19, mild AKI.   ?Prerenal etiology in the setting of dehydration related to DKA.   ?Creatinine normalized with IV fluids.   ?--Monitor BMP. ?   ?Nausea and vomiting-resolved as of 10/28/2021 ?POA, now resolved.   ?Suspect due to DKA. ?Zofran as needed. ?  ?SIRS due to infectious process with acute organ dysfunction (HCC)-resolved as of 10/28/2021 ?Presented with tachypnea, leukocytosis. ?UA and chest x-ray negative for infection.  Suspect SIRS due to DKA. ?Stopped  antibiotics and monitor clinically ?-- Follow-up blood cultures,  NGTD ?-- Off IV fluids ?  ?Leukocytosis-resolved as of 10/28/2021 ?Suspect due to hypovolemia.  Patient is afebrile, no leukocytosis, family deny recent signs or symptoms of infection.  Started on broad-spectrum antibiotics on admission. ?-- Stop antibiotics and monitor clinically for signs or symptoms of infection ?  ?Patient remained clinically stable with out signs of infection. ?  ?Acute metabolic encephalopathy-resolved as of 10/28/2021 ?POA, appears resolved.  Likely due to DKA.  Patient was lethargic, only opened eyes briefly on 4/11.  Family reports baseline mild dementia with no sundowning type behaviors. No focal neurologic deficits.  This morning, patient sitting up, awake, talkative able to express needs.  Working with SLP. ?Appears at her baseline mental status. ?  ? ? ?DVT prophylaxis: Lovenox ?Code Status: DNR ?Family Communication: Daughter at bedside ?Disposition Plan:  ?Status is: Inpatient ?Remains inpatient appropriate because: Admitted for DKA went into A-fib requiring IV Cardizem.  Now heart rate is well controlled.  Patient had aspiration event yesterday, remains lethargic.  She is much improved today.  Speech and swallow completed. ?Patient successfully voided.  Patient is being discharged to SNF today. ? ? ?Consultants:  ?Cardiology ? ?Procedures: Echocardiogram ?Antimicrobials: ?Anti-infectives (From admission, onward)  ? ? Start     Dose/Rate Route Frequency Ordered Stop  ? 10/24/21 2300  vancomycin (VANCOREADY) IVPB 750 mg/150 mL  Status:  Discontinued       ? 750 mg ?150 mL/hr over 60 Minutes Intravenous Every 48 hours 10/23/21 0110 10/23/21 0852  ? 10/23/21 2200  ceFEPIme (MAXIPIME) 2 g in sodium chloride 0.9 % 100 mL IVPB  Status:  Discontinued       ? 2 g ?200 mL/hr over 30 Minutes Intravenous Every 24 hours 10/23/21 0054 10/23/21 0852  ? 10/23/21 0145  vancomycin (VANCOCIN) IVPB 1000 mg/200 mL premix       ? 1,000 mg ?200 mL/hr over 60 Minutes Intravenous  Once 10/23/21 0057 10/23/21  0207  ? 10/22/21 2215  ceFEPIme (MAXIPIME) 2 g in sodium chloride 0.9 % 100 mL IVPB       ? 2 g ?200 mL/hr over 30 Minutes Intravenous  Once 10/22/21 2205 10/23/21 0100  ? 10/22/21 2215  metroNIDAZOLE (FLAGYL) IVPB 500 mg  Status:  Discontinued       ? 500 mg ?100 mL/hr over 60 Minutes Intravenous Every 12 hours 10/22/21 2205 10/23/21 0852  ? 10/22/21 2215  vancomycin (VANCOREADY) IVPB 1250 mg/250 mL  Status:  Discontinued       ? 1,250 mg ?166.7 mL/hr over 90 Minutes Intravenous  Once 10/22/21 2205 10/23/21 0057  ? ?  ? ?Subjective: ?Patient was seen and examined at bedside.  Overnight events noted. ?She is alert, oriented x 2, hard of hearing, having breakfast. ?Patient is being discharged to SNF today. ? ?Objective: ?Vitals:  ? 10/29/21 2026 10/30/21 0510 10/30/21 0510 10/30/21 0721  ?BP: (!) 101/54 125/71 125/71 (!) 133/59  ?Pulse: (!) 58 67 67 (!) 52  ?Resp: '16 16 16   '$ ?Temp: 98.3 ?F (36.8 ?C) 98.5 ?F (36.9 ?C) 98.5 ?F (36.9 ?C) 97.7 ?F (36.5 ?C)  ?TempSrc:      ?SpO2: 97% 97% 97% 97%  ?Weight:  45.8 kg    ?Height:      ? ? ?Intake/Output Summary (Last 24 hours) at 10/30/2021 1130 ?Last data filed at 10/29/2021 1938 ?Gross per 24 hour  ?Intake 480 ml  ?Output 200 ml  ?Net 280 ml  ? ?  Filed Weights  ? 10/28/21 0500 10/29/21 0353 10/30/21 0510  ?Weight: 46 kg 45.1 kg 45.8 kg  ? ? ?Examination: ? ?General exam: Appears comfortable, not in any acute distress, deconditioned. ?Respiratory system: CTA bilaterally, no wheezing, no crackles, respiratory effort normal. ?Cardiovascular system: S1 & S2 heard, regular rate and rhythm, no murmur.   ?Gastrointestinal system: Abdomen is soft, non tender, non distended, BS+ ?Central nervous system: Alert and oriented x 2. No focal neurological deficits. ?Extremities: No edema, no cyanosis, no clubbing. ?Skin: No rashes, lesions or ulcers ?Psychiatry: Judgement and insight appear normal. Mood & affect appropriate.  ? ? ? ?Data Reviewed: I have personally reviewed following labs and  imaging studies ? ?CBC: ?Recent Labs  ?Lab 10/30/21 ?5183  ?WBC 6.8  ?HGB 13.4  ?HCT 41.5  ?MCV 92.6  ?PLT 102*  ? ?Basic Metabolic Panel: ?Recent Labs  ?Lab 10/23/21 ?1329 10/24/21 ?0440 10/25/21 ?3582 04/

## 2021-10-30 NOTE — TOC Transition Note (Signed)
Transition of Care (TOC) - CM/SW Discharge Note ? ? ?Patient Details  ?Name: Angela Patterson ?MRN: [id number] ?Date of Birth: 02-Jun-1921 ? ?Transition of Care (TOC) CM/SW Contact:  ?Laurena Slimmer, RN ?Phone Number: ?10/30/2021, 11:53 AM ? ? ?Clinical Narrative:    ?Confirmed patient could d/c with Tina at [facility]. D/c order received. FL2 & discharge summary sent in hub. Family and nurse notified. Nurse will call report to [phone] (Room 704).EMS transport set up. TOC signing off.  ? ? ?Final next level of care: [place] ?Barriers to Discharge: No Barriers Identified ? ? ?Patient Goals and CMS Choice ?Patient states their goals for this hospitalization and ongoing recovery are:: to go home ?CMS Medicare.gov Compare Post Acute Care list provided to:: Patient Represenative (must comment) (daughter [name]) ?Choice offered to / list presented to : Adult Children ? ?Discharge Placement ?  ?           ?Patient chooses bed at:  ([facility]) ?Patient to be transferred to facility by: [facility] ?  ?Patient and family notified of of transfer: 10/28/21 ? ?Discharge Plan and Services ?  ?  ?           ?  ?  ?  ?  ?  ?  ?  ?  ?  ?  ? ?Social Determinants of Health (SDOH) Interventions ?  ? ? ?Readmission Risk Interventions ?   ? View : No data to display.  ?  ?  ?  ? ? ? ? ? ?

## 2021-10-30 NOTE — Plan of Care (Signed)
?  Problem: Education: ?Goal: Knowledge of General Education information will improve ?Description: Including pain rating scale, medication(s)/side effects and non-pharmacologic comfort measures ?Outcome: Progressing ?  ?Problem: Health Behavior/Discharge Planning: ?Goal: Ability to manage health-related needs will improve ?Outcome: Progressing ?  ?Problem: Clinical Measurements: ?Goal: Ability to maintain clinical measurements within normal limits will improve ?Outcome: Progressing ?Goal: Will remain free from infection ?Outcome: Progressing ?Goal: Diagnostic test results will improve ?Outcome: Progressing ?Goal: Respiratory complications will improve ?Outcome: Progressing ?Goal: Cardiovascular complication will be avoided ?Outcome: Progressing ?  ?Problem: Activity: ?Goal: Risk for activity intolerance will decrease ?Outcome: Progressing ?  ?Problem: Nutrition: ?Goal: Adequate nutrition will be maintained ?Outcome: Progressing ?  ?Problem: Coping: ?Goal: Level of anxiety will decrease ?Outcome: Progressing ?  ?Problem: Elimination: ?Goal: Will not experience complications related to bowel motility ?Outcome: Progressing ?Goal: Will not experience complications related to urinary retention ?Outcome: Progressing ?  ?Problem: Pain Managment: ?Goal: General experience of comfort will improve ?Outcome: Progressing ?  ?Problem: Safety: ?Goal: Ability to remain free from injury will improve ?Outcome: Progressing ?  ?Problem: Skin Integrity: ?Goal: Risk for impaired skin integrity will decrease ?Outcome: Progressing ?  ?Problem: Elimination: ?Goal: Will not experience complications related to bowel motility ?Outcome: Progressing ?  ?Problem: Pain Managment: ?Goal: General experience of comfort will improve ?Outcome: Progressing ?  ?Problem: Safety: ?Goal: Ability to remain free from injury will improve ?Outcome: Progressing ?  ?Problem: Skin Integrity: ?Goal: Risk for impaired skin integrity will decrease ?Outcome:  Progressing ?  ?

## 2021-10-30 NOTE — Progress Notes (Signed)
Report called to [facility] EMS called.  ?

## 2021-10-30 NOTE — Progress Notes (Signed)
PT Cancellation Note ? ?Patient Details ?Name: Angela Patterson ?MRN: [id number] ?DOB: August 02, 1920 ? ? ?Cancelled Treatment:     Pt declined, had worked with Mobility Team earlier for transfers and is currently awaiting transfer to [place]. Will continue per POC if discharge plans change ? ?Josie Dixon ?10/30/2021, 1:02 PM ?

## 2021-10-31 DIAGNOSIS — I480 Paroxysmal atrial fibrillation: Secondary | ICD-10-CM | POA: Diagnosis not present

## 2021-10-31 DIAGNOSIS — R1312 Dysphagia, oropharyngeal phase: Secondary | ICD-10-CM | POA: Diagnosis not present

## 2021-10-31 DIAGNOSIS — M6281 Muscle weakness (generalized): Secondary | ICD-10-CM | POA: Diagnosis not present

## 2021-10-31 DIAGNOSIS — E1165 Type 2 diabetes mellitus with hyperglycemia: Secondary | ICD-10-CM | POA: Diagnosis not present

## 2021-11-02 ENCOUNTER — Emergency Department: Payer: Medicare Other

## 2021-11-02 ENCOUNTER — Inpatient Hospital Stay
Admission: EM | Admit: 2021-11-02 | Discharge: 2021-11-17 | DRG: 951 | Disposition: E | Payer: Medicare Other | Source: Skilled Nursing Facility | Attending: Family Medicine | Admitting: Family Medicine

## 2021-11-02 ENCOUNTER — Encounter: Payer: Self-pay | Admitting: Emergency Medicine

## 2021-11-02 ENCOUNTER — Other Ambulatory Visit: Payer: Self-pay

## 2021-11-02 DIAGNOSIS — R0681 Apnea, not elsewhere classified: Secondary | ICD-10-CM | POA: Diagnosis not present

## 2021-11-02 DIAGNOSIS — E785 Hyperlipidemia, unspecified: Secondary | ICD-10-CM | POA: Diagnosis not present

## 2021-11-02 DIAGNOSIS — L89321 Pressure ulcer of left buttock, stage 1: Secondary | ICD-10-CM | POA: Diagnosis present

## 2021-11-02 DIAGNOSIS — R112 Nausea with vomiting, unspecified: Secondary | ICD-10-CM | POA: Diagnosis present

## 2021-11-02 DIAGNOSIS — IMO0001 Reserved for inherently not codable concepts without codable children: Secondary | ICD-10-CM

## 2021-11-02 DIAGNOSIS — I4892 Unspecified atrial flutter: Secondary | ICD-10-CM | POA: Diagnosis not present

## 2021-11-02 DIAGNOSIS — R110 Nausea: Secondary | ICD-10-CM | POA: Diagnosis not present

## 2021-11-02 DIAGNOSIS — E119 Type 2 diabetes mellitus without complications: Secondary | ICD-10-CM

## 2021-11-02 DIAGNOSIS — D72829 Elevated white blood cell count, unspecified: Secondary | ICD-10-CM

## 2021-11-02 DIAGNOSIS — R627 Adult failure to thrive: Secondary | ICD-10-CM | POA: Diagnosis present

## 2021-11-02 DIAGNOSIS — G9341 Metabolic encephalopathy: Secondary | ICD-10-CM | POA: Diagnosis present

## 2021-11-02 DIAGNOSIS — Z8673 Personal history of transient ischemic attack (TIA), and cerebral infarction without residual deficits: Secondary | ICD-10-CM | POA: Diagnosis not present

## 2021-11-02 DIAGNOSIS — Z681 Body mass index (BMI) 19 or less, adult: Secondary | ICD-10-CM | POA: Diagnosis not present

## 2021-11-02 DIAGNOSIS — R5383 Other fatigue: Secondary | ICD-10-CM | POA: Diagnosis not present

## 2021-11-02 DIAGNOSIS — M542 Cervicalgia: Secondary | ICD-10-CM | POA: Diagnosis not present

## 2021-11-02 DIAGNOSIS — E871 Hypo-osmolality and hyponatremia: Secondary | ICD-10-CM | POA: Diagnosis present

## 2021-11-02 DIAGNOSIS — R109 Unspecified abdominal pain: Secondary | ICD-10-CM | POA: Diagnosis present

## 2021-11-02 DIAGNOSIS — R531 Weakness: Secondary | ICD-10-CM

## 2021-11-02 DIAGNOSIS — I4510 Unspecified right bundle-branch block: Secondary | ICD-10-CM | POA: Diagnosis present

## 2021-11-02 DIAGNOSIS — I480 Paroxysmal atrial fibrillation: Secondary | ICD-10-CM | POA: Diagnosis not present

## 2021-11-02 DIAGNOSIS — R4182 Altered mental status, unspecified: Principal | ICD-10-CM | POA: Diagnosis present

## 2021-11-02 DIAGNOSIS — R1311 Dysphagia, oral phase: Secondary | ICD-10-CM | POA: Diagnosis not present

## 2021-11-02 DIAGNOSIS — E1169 Type 2 diabetes mellitus with other specified complication: Secondary | ICD-10-CM | POA: Diagnosis present

## 2021-11-02 DIAGNOSIS — Z7984 Long term (current) use of oral hypoglycemic drugs: Secondary | ICD-10-CM

## 2021-11-02 DIAGNOSIS — Z515 Encounter for palliative care: Principal | ICD-10-CM

## 2021-11-02 DIAGNOSIS — M6259 Muscle wasting and atrophy, not elsewhere classified, multiple sites: Secondary | ICD-10-CM | POA: Diagnosis not present

## 2021-11-02 DIAGNOSIS — Z8249 Family history of ischemic heart disease and other diseases of the circulatory system: Secondary | ICD-10-CM

## 2021-11-02 DIAGNOSIS — Z800 Family history of malignant neoplasm of digestive organs: Secondary | ICD-10-CM

## 2021-11-02 DIAGNOSIS — E43 Unspecified severe protein-calorie malnutrition: Secondary | ICD-10-CM | POA: Diagnosis present

## 2021-11-02 DIAGNOSIS — R2681 Unsteadiness on feet: Secondary | ICD-10-CM | POA: Diagnosis not present

## 2021-11-02 DIAGNOSIS — R739 Hyperglycemia, unspecified: Secondary | ICD-10-CM | POA: Diagnosis not present

## 2021-11-02 DIAGNOSIS — Z66 Do not resuscitate: Secondary | ICD-10-CM | POA: Diagnosis present

## 2021-11-02 DIAGNOSIS — E7800 Pure hypercholesterolemia, unspecified: Secondary | ICD-10-CM | POA: Diagnosis present

## 2021-11-02 DIAGNOSIS — R000 Tachycardia, unspecified: Secondary | ICD-10-CM | POA: Diagnosis not present

## 2021-11-02 DIAGNOSIS — D696 Thrombocytopenia, unspecified: Secondary | ICD-10-CM | POA: Diagnosis present

## 2021-11-02 DIAGNOSIS — I4891 Unspecified atrial fibrillation: Secondary | ICD-10-CM

## 2021-11-02 DIAGNOSIS — Z833 Family history of diabetes mellitus: Secondary | ICD-10-CM

## 2021-11-02 DIAGNOSIS — R41841 Cognitive communication deficit: Secondary | ICD-10-CM | POA: Diagnosis not present

## 2021-11-02 DIAGNOSIS — R079 Chest pain, unspecified: Secondary | ICD-10-CM | POA: Diagnosis not present

## 2021-11-02 DIAGNOSIS — R9431 Abnormal electrocardiogram [ECG] [EKG]: Secondary | ICD-10-CM | POA: Diagnosis not present

## 2021-11-02 DIAGNOSIS — Z741 Need for assistance with personal care: Secondary | ICD-10-CM | POA: Diagnosis not present

## 2021-11-02 DIAGNOSIS — I10 Essential (primary) hypertension: Secondary | ICD-10-CM | POA: Diagnosis not present

## 2021-11-02 DIAGNOSIS — R1111 Vomiting without nausea: Secondary | ICD-10-CM | POA: Diagnosis not present

## 2021-11-02 DIAGNOSIS — Z90710 Acquired absence of both cervix and uterus: Secondary | ICD-10-CM

## 2021-11-02 DIAGNOSIS — I2510 Atherosclerotic heart disease of native coronary artery without angina pectoris: Secondary | ICD-10-CM | POA: Diagnosis not present

## 2021-11-02 DIAGNOSIS — F419 Anxiety disorder, unspecified: Secondary | ICD-10-CM | POA: Diagnosis present

## 2021-11-02 DIAGNOSIS — H9190 Unspecified hearing loss, unspecified ear: Secondary | ICD-10-CM | POA: Diagnosis not present

## 2021-11-02 DIAGNOSIS — Z9049 Acquired absence of other specified parts of digestive tract: Secondary | ICD-10-CM

## 2021-11-02 DIAGNOSIS — L89301 Pressure ulcer of unspecified buttock, stage 1: Secondary | ICD-10-CM

## 2021-11-02 DIAGNOSIS — F0394 Unspecified dementia, unspecified severity, with anxiety: Secondary | ICD-10-CM | POA: Diagnosis present

## 2021-11-02 DIAGNOSIS — Z823 Family history of stroke: Secondary | ICD-10-CM

## 2021-11-02 LAB — CBC WITH DIFFERENTIAL/PLATELET
Abs Immature Granulocytes: 0.07 10*3/uL (ref 0.00–0.07)
Basophils Absolute: 0 10*3/uL (ref 0.0–0.1)
Basophils Relative: 0 %
Eosinophils Absolute: 0.1 10*3/uL (ref 0.0–0.5)
Eosinophils Relative: 1 %
HCT: 44.4 % (ref 36.0–46.0)
Hemoglobin: 13.9 g/dL (ref 12.0–15.0)
Immature Granulocytes: 1 %
Lymphocytes Relative: 14 %
Lymphs Abs: 1.8 10*3/uL (ref 0.7–4.0)
MCH: 29.4 pg (ref 26.0–34.0)
MCHC: 31.3 g/dL (ref 30.0–36.0)
MCV: 93.9 fL (ref 80.0–100.0)
Monocytes Absolute: 0.9 10*3/uL (ref 0.1–1.0)
Monocytes Relative: 7 %
Neutro Abs: 9.8 10*3/uL — ABNORMAL HIGH (ref 1.7–7.7)
Neutrophils Relative %: 77 %
Platelets: 125 10*3/uL — ABNORMAL LOW (ref 150–400)
RBC: 4.73 MIL/uL (ref 3.87–5.11)
RDW: 13.2 % (ref 11.5–15.5)
WBC: 12.7 10*3/uL — ABNORMAL HIGH (ref 4.0–10.5)
nRBC: 0 % (ref 0.0–0.2)

## 2021-11-02 LAB — TROPONIN I (HIGH SENSITIVITY): Troponin I (High Sensitivity): 7 ng/L (ref <18)

## 2021-11-02 LAB — CBG MONITORING, ED: Glucose-Capillary: 256 mg/dL — ABNORMAL HIGH (ref 70–99)

## 2021-11-02 MED ORDER — GLYCOPYRROLATE 0.2 MG/ML IJ SOLN
0.2000 mg | INTRAMUSCULAR | Status: DC | PRN
  Filled 2021-11-02: qty 1

## 2021-11-02 MED ORDER — LOPERAMIDE HCL 1 MG/7.5ML PO SUSP
4.0000 mg | ORAL | Status: DC | PRN
  Filled 2021-11-02: qty 30

## 2021-11-02 MED ORDER — MORPHINE SULFATE (PF) 2 MG/ML IV SOLN
2.0000 mg | INTRAVENOUS | Status: DC | PRN
  Administered 2021-11-03: 1 mg via INTRAVENOUS
  Administered 2021-11-05 – 2021-11-06 (×3): 2 mg via INTRAVENOUS
  Filled 2021-11-02 (×5): qty 1

## 2021-11-02 MED ORDER — LORAZEPAM 2 MG/ML IJ SOLN
1.0000 mg | INTRAMUSCULAR | Status: DC | PRN
  Administered 2021-11-02 – 2021-11-05 (×7): 1 mg via INTRAVENOUS
  Filled 2021-11-02 (×7): qty 1

## 2021-11-02 MED ORDER — POLYETHYLENE GLYCOL 3350 17 G PO PACK: 17.0000 g | PACK | Freq: Every day | ORAL | Status: DC | PRN

## 2021-11-02 MED ORDER — ONDANSETRON HCL 4 MG PO TABS: 4.0000 mg | ORAL_TABLET | ORAL | Status: DC | PRN

## 2021-11-02 MED ORDER — HALOPERIDOL LACTATE 5 MG/ML IJ SOLN
0.5000 mg | INTRAMUSCULAR | Status: DC | PRN
  Administered 2021-11-03 – 2021-11-09 (×2): 0.5 mg via INTRAVENOUS
  Filled 2021-11-02 (×3): qty 1

## 2021-11-02 MED ORDER — MORPHINE SULFATE (CONCENTRATE) 10 MG/0.5ML PO SOLN: 5.0000 mg | ORAL | Status: DC | PRN

## 2021-11-02 MED ORDER — HALOPERIDOL 0.5 MG PO TABS
0.5000 mg | ORAL_TABLET | ORAL | Status: DC | PRN
  Filled 2021-11-02: qty 1

## 2021-11-02 MED ORDER — ENOXAPARIN SODIUM 30 MG/0.3ML IJ SOSY: 30.0000 mg | PREFILLED_SYRINGE | INTRAMUSCULAR | Status: DC

## 2021-11-02 MED ORDER — HALOPERIDOL LACTATE 2 MG/ML PO CONC
0.5000 mg | ORAL | Status: DC | PRN
  Filled 2021-11-02: qty 0.3

## 2021-11-02 MED ORDER — SODIUM CHLORIDE 0.9 % IV SOLN
12.5000 mg | Freq: Four times a day (QID) | INTRAVENOUS | Status: DC | PRN
  Filled 2021-11-02: qty 0.5

## 2021-11-02 MED ORDER — HYDROCODONE-ACETAMINOPHEN 5-325 MG PO TABS: 1.0000 | ORAL_TABLET | ORAL | Status: DC | PRN

## 2021-11-02 MED ORDER — TEMAZEPAM 7.5 MG PO CAPS: 15.0000 mg | ORAL_CAPSULE | Freq: Every evening | ORAL | Status: DC | PRN

## 2021-11-02 MED ORDER — BIOTENE DRY MOUTH MT LIQD
15.0000 mL | OROMUCOSAL | Status: DC | PRN
Start: 2021-11-02 — End: 2021-11-14
  Filled 2021-11-02: qty 15

## 2021-11-02 MED ORDER — ONDANSETRON HCL 4 MG PO TABS: 4.0000 mg | ORAL_TABLET | Freq: Four times a day (QID) | ORAL | Status: DC | PRN

## 2021-11-02 MED ORDER — GLYCOPYRROLATE 1 MG PO TABS
1.0000 mg | ORAL_TABLET | ORAL | Status: DC | PRN
  Filled 2021-11-02: qty 1

## 2021-11-02 MED ORDER — SODIUM CHLORIDE 0.9 % IV SOLN
12.5000 mg | Freq: Four times a day (QID) | INTRAVENOUS | Status: DC | PRN
  Administered 2021-11-02: 12.5 mg via INTRAVENOUS
  Filled 2021-11-02 (×2): qty 0.5

## 2021-11-02 MED ORDER — ACETAMINOPHEN 325 MG PO TABS
650.0000 mg | ORAL_TABLET | Freq: Four times a day (QID) | ORAL | Status: DC | PRN
Start: 2021-11-02 — End: 2021-11-14

## 2021-11-02 MED ORDER — ACETAMINOPHEN 325 MG RE SUPP: 650.0000 mg | Freq: Four times a day (QID) | RECTAL | Status: DC | PRN

## 2021-11-02 MED ORDER — ACETAMINOPHEN 650 MG RE SUPP: 650.0000 mg | Freq: Four times a day (QID) | RECTAL | Status: DC | PRN

## 2021-11-02 MED ORDER — ONDANSETRON HCL 4 MG/2ML IJ SOLN
4.0000 mg | INTRAMUSCULAR | Status: DC | PRN
  Administered 2021-11-02 – 2021-11-03 (×2): 4 mg via INTRAVENOUS
  Filled 2021-11-02 (×3): qty 2

## 2021-11-02 MED ORDER — DOCUSATE NICU RECTAL SYRINGE 10 MG/ML
1.0000 mL | Freq: Two times a day (BID) | RECTAL | Status: DC | PRN
  Filled 2021-11-02: qty 1

## 2021-11-02 MED ORDER — ONDANSETRON 4 MG PO TBDP: 4.0000 mg | ORAL_TABLET | Freq: Four times a day (QID) | ORAL | Status: DC | PRN

## 2021-11-02 MED ORDER — POLYVINYL ALCOHOL 1.4 % OP SOLN
1.0000 [drp] | Freq: Four times a day (QID) | OPHTHALMIC | Status: DC | PRN
  Filled 2021-11-02: qty 15

## 2021-11-02 MED ORDER — ONDANSETRON HCL 4 MG/2ML IJ SOLN: 4.0000 mg | Freq: Four times a day (QID) | INTRAMUSCULAR | Status: DC | PRN

## 2021-11-02 MED ORDER — HYDROMORPHONE HCL 1 MG/ML IJ SOLN
0.5000 mg | INTRAMUSCULAR | Status: DC | PRN
  Administered 2021-11-04 – 2021-11-09 (×17): 0.5 mg via INTRAVENOUS
  Filled 2021-11-02 (×18): qty 1

## 2021-11-02 MED ORDER — ACETAMINOPHEN 325 MG PO TABS: 650.0000 mg | ORAL_TABLET | Freq: Four times a day (QID) | ORAL | Status: DC | PRN

## 2021-11-02 MED ORDER — ONDANSETRON HCL 4 MG/2ML IJ SOLN
4.0000 mg | Freq: Four times a day (QID) | INTRAMUSCULAR | Status: DC | PRN
  Administered 2021-11-02: 4 mg via INTRAVENOUS
  Filled 2021-11-02: qty 2

## 2021-11-02 NOTE — ED Provider Notes (Signed)
? ?[facility] ?Provider Note ? ? ? Event Date/Time  ? First MD Initiated Contact with Patient 10/27/2021 1132   ?  (approximate) ? ? ?History  ? ?Abdominal Pain and Emesis ? ? ?HPI ? ?Angela Patterson is a 88 y.o. female  with history of paroxysmal atrial fibrillation, hypertension, history of CVA, dementia, non-insulin-dependent diabetes mellitus, who [facility].  She had not been feeling really well yesterday and felt tired.  Last night she was found on the floor in the bathroom apparently fell and pulled the cord.  This morning she was eating a little bit but then when family went to see her at 66 AM she had been vomiting and vomiting and was very groggy and minimally responsive like she was earlier this month when she had DKA. ? ?  ? ? ?Physical Exam  ? ?Triage Vital Signs: ?ED Triage Vitals  ?Enc Vitals Group  ?   BP   ?   Pulse   ?   Resp   ?   Temp   ?   Temp src   ?   SpO2   ?   Weight   ?   Height   ?   Head Circumference   ?   [facility]   ?   Pain Score   ?   Pain Loc   ?   Pain Edu?   ?   Excl. in [place]?   ? ? ?Most recent vital signs: ?Vitals:  ? 11/07/2021 1133 11/11/2021 1138  ?BP:  (!) 153/82  ?Pulse:  62  ?Resp:  (!) 26  ?Temp:  (!) 97.5 ?F (36.4 ?C)  ?SpO2: 98% 93%  ? ? ?Constitutional: Sleepy difficult to arouse ?CV:  Good peripheral perfusion.  Heart regular rate and rhythm no audible murmurs ?Resp:  Normal effort.  Lungs are clear ?Abd:  No distention.  Abdomen soft does not appear to be tender ?Patient has occasional tremors.  These do not appear to be seizures there is a history of this in the past. ? ? ?ED Results / Procedures / Treatments  ? ?Labs ?(all labs ordered are listed, but only abnormal results are displayed) ?Labs Reviewed  ?CBC WITH DIFFERENTIAL/PLATELET - Abnormal; Notable for the following components:  ?    Result Value  ? WBC 12.7 (*)   ? Platelets 125 (*)   ? Neutro Abs 9.8 (*)   ? All other components within normal limits  ?CBG MONITORING, ED -  Abnormal; Notable for the following components:  ? Glucose-Capillary 256 (*)   ? All other components within normal limits  ?URINALYSIS, ROUTINE W REFLEX MICROSCOPIC  ?LACTIC ACID, PLASMA  ?LACTIC ACID, PLASMA  ?COMPREHENSIVE METABOLIC PANEL  ?LIPASE, BLOOD  ?TROPONIN I (HIGH SENSITIVITY)  ? ? ? ?EKG ? ?EKG read interpreted by me shows a flutter rate of 96 slight right axis right bundle branch block similar to prior EKGs. ? ? ?RADIOLOGY ?CT of the abdomen read by radiology reviewed by me shows no acute changes ? ? ?PROCEDURES: ? ?Critical Care performed:  ? ?Procedures ?Discussed patient with family who is here.  They gave me most of the history.  Patient is not at her baseline. ?----------------------------------------- ?12:32 PM on 10/27/2021 ?----------------------------------------- ?Rest the family has now arrived.  They all decided that the patient is 30-1/86 years old she has a living will which specifies she did not want any aggressive things done if she was dying and they feel that  this is her second visit in a short amount of time for altered mental status they would like her to get palliative care.  I have no objection at all to this.  I have ordered palliative care consult and comfort care admission.  This is what the family wants. ? ?MEDICATIONS ORDERED IN ED: ?Medications - No data to display ? ? ?IMPRESSION / MDM / ASSESSMENT AND PLAN / ED COURSE  ?I reviewed the triage vital signs and the nursing notes. ?Labs have hemolyzed family does not want them redrawn. ? ? ? ? ? ? ?FINAL CLINICAL IMPRESSION(S) / ED DIAGNOSES  ? ?Final diagnoses:  ?Altered mental status, unspecified altered mental status type  ? ? ? ?Rx / DC Orders  ? ?ED Discharge Orders   ? ? None  ? ?  ? ? ? ?Note:  This document was prepared using Dragon voice recognition software and may include unintentional dictation errors. ?  ?Nena Polio, MD ?11/15/2021 1234 ? ?

## 2021-11-02 NOTE — Hospital Course (Addendum)
This 88 year old female with history of CVA, non-insulin-dependent diabetes mellitus, dementia, paroxysmal atrial fibrillation, hypertension, anxiety, severe protein calorie malnutrition, pressure injury of the skin, hyperlipidemia, recent hospitalization for altered mental status secondary to DKA, recent AKI, recent hypernatremia, recent sirs, who presents to the emergency department from [facility] with chief concerns about altered mental status, worsening weakness, vomiting. ? ?The patient was made comfort care measures by the family.  Currently awaiting hospice home bed availability.  Patient previously following commands, but in the last 24 hours, has become nonresponsive.  Anticipate hospital death. ? ? ? ? ?

## 2021-11-02 NOTE — ED Triage Notes (Signed)
Pt to ED via [facility] from [facility] [place] for Abdominal pain, nausea, and vomiting. EMS reports several patients at facility with same. Pt family came in around 1030 and requested she be sent for evaluation. Pt was given approximately 500 cc of fluids in route as well as 4 mg of Zofran. Pt was in controlled A. Fib on the monitor with a history of same. Pt is currently in NAD.  ?

## 2021-11-02 NOTE — Assessment & Plan Note (Addendum)
Recent hospitalization for DKA.  ?No further treatments for diabetes since comfort care measures.   ?Holding cholesterol medication at this time. ?

## 2021-11-02 NOTE — Progress Notes (Signed)
Manufacturing engineer (ACC) ? ?Referral received for EOL care at [facility]. ? ?Unfortunately, we do not have a bed to offer. ? ?ACC will update family and hospital once eligibility for Hospice Home has been determined and we have a bed available. ? ?Thank you, ?Venia Carbon BSN, RN ?[facility] Liaison  ?

## 2021-11-02 NOTE — ED Notes (Signed)
Pt daughters at bedside. Pt daughters state she is on palliative care and is a DNR. They wish to move pt to hospice care and not seek anymore aggressive treatment. Family going to speak with pt son to make a decision. Cinda Quest, MD made aware.   ?

## 2021-11-02 NOTE — Progress Notes (Signed)
Chaplain Maggie made initial visit with family gathered at bedside. Pt is known to have strong faith over a lifetime. Family impressed she has been a disciplined, hard working woman. Storytelling and prayer were central to this visit. Family awaits pt's admission and then discerning decisions related to next steps in goals of care. Continued support available per on call chaplain. ?

## 2021-11-02 NOTE — TOC Progression Note (Signed)
Transition of Care (TOC) - Progression Note  ? ? ?Patient Details  ?Name: Angela Patterson ?MRN: [id number] ?Date of Birth: 11-01-1920 ? ?Transition of Care (TOC) CM/SW Contact  ?[place], LCSWA ?Phone Number: ?10/31/2021, 3:20 PM ? ?Clinical Narrative:    ? ?TOC consult for hospice for comfort measures. CSW shared patient's daughter's contact information with representative from [place].  ? ?No further TOC needs at this time.  ? ?  ?  ? ?Expected Discharge Plan and Services ?  ?  ?  ?  ?  ?                ?  ?  ?  ?  ?  ?  ?  ?  ?  ?  ? ? ?Social Determinants of Health (SDOH) Interventions ?  ? ?Readmission Risk Interventions ?   ? View : No data to display.  ?  ?  ?  ? ? ?

## 2021-11-02 NOTE — H&P (Signed)
?History and Physical  ? ?[name] AQT:[id number] DOB: 08-19-1920 DOA: 11/01/2021 ? ?PCP: Einar Pheasant, MD  ?Outpatient Specialists: Dr. Amalia Hailey, Podiatry ?Patient coming from: [facility] via EMS ? ?I have personally briefly reviewed patient's old medical records in [place]. ? ?Chief Concern: Altered mental status, weakness ? ?HPI: [name] is a 88 year old female with history of CVA, non-insulin-dependent diabetes mellitus, dementia, paroxysmal atrial fibrillation, hypertension, anxiety, severe protein calorie malnutrition, pressure injury of the skin, hyperlipidemia, recent hospitalization for altered mental status secondary to DKA, recent AKI, recent hypernatremia, recent sirs, who presents emergency department from [facility] for chief concerns of altered mental status, worsening weakness, vomiting. ? ?Initial vitals in the emergency department showed temperature of 97.5, respiration rate of 26, heart rate 62, blood pressure 153/82, SPO2 of 93% on room air. ? ?Initial labs in the ED showed WBC elevated at 12.7, hemoglobin 13.9, platelets of 125.  High sensitive troponin was 7. ? ?CMP was ordered.  Family discussed with emergency medicine provider, that they would like patient to be admitted for comfort care. ? ?ED imaging: CT abdomen and pelvis without contrast in the ED was read as no evidence of intestinal obstruction or pneumoperitoneum.  There is no hydronephrosis.  Extensive atherosclerotic changes are noted.  2.3 cm ectasia in the infrarenal aorta, coronary artery calcifications are seen.  Cardiomegaly.  Lumbar spondylosis. ? ?CT of the head without contrast and CT cervical spine without contrast: Was read as no acute intracranial findings are seen on noncontrast CT of the brain.  H&P.  Small vessel disease.  No recent fractures seen.  Cervical spondylosis.  No significant interval changes are noted on cervical spine. ? ?ED treatment: None ? ?Per ED staff notes: Patient brought  in via EMS and prior to ED presentation, EMS administered 500 mL of fluid and ondansetron 4 mg.  Patient was noted to have controlled atrial fibrillation on monitor and was not in acute distress. ? ?At bedside patient appeared frail, weak.  She opens her eyes to moderate pressure sternal rub.  She opens her eyes to loud verbal stimuli.  Though her eyes are not fully open and pupils are not able to be appreciated.  She appears to be resting deeply. ? ?Per Angela Patterson, I last saw her somewhat well on 11/01/2021, sitting in the Courtyard in [facility].  She ate a little bit at that time.  She did endorse to her daughter that she really wanted to rest and lay in bed.  She did not want to be doing activity or sitting in the Courtyard. ? ?On day of admission, patient had a small bite of food to eat in the morning and she promptly vomited.  EMS was called and per nursing note, 500 mL of fluid and 4 mg of ondansetron were administered by EMS. ? ?On my evaluation, her son Angela Patterson and his wife were at bedside.  After Angela Patterson to discuss the above, patient's 2 daughters Angela Patterson and Angela Patterson) and a son in law entered the room. ? ?Full discussion with the above 3 children was conducted by myself with son and 2 daughters.  All 3 of her children agreed that patient will be made comfort care. ? ?Social history: Patient formally lived at [facility] assisted [facility].  Due to recent hospitalization for DKA, patient was discharged to [facility] for physical rehabilitation ? ?ROS: Unable to complete due to nonverbal at this time. ? ?ED Course: Discussed with emergency medicine provider, patient requiring hospitalization for  chief concerns of comfort measures. ? ?Assessment/Plan ? ?Principal Problem: ?  End of life care ?Active Problems: ?  Hypercholesterolemia ?  History of CVA (cerebrovascular accident) ?  Thrombocytopenia ([place]) ?  Essential hypertension ?  Type 2 diabetes mellitus ([place]) ?  Anxiety ?  Hearing impairment ?  Weakness ?   Paroxysmal atrial fibrillation (HCC) ?  Leukocytosis ?  Protein-calorie malnutrition, severe ?  Altered mental status ?  Prolonged QT interval ?  ?Assessment and Plan: ? ?* End of life care ?- Patient presenting with apparent failure to thrive with possible sepsis, etiology was not worked up due to patient's family requesting comfort ?- After discussion in the emergency medicine provider, family has decided to proceed with comfort care only ?- Comfort care order set utilized ?- No antibiotics or IVF as per family's request ?- Pain control with morphine as needed, would transition to a drip if needed but patient does not appear to be in pain at this time ?- RN can pronounce death ? ?Type 2 diabetes mellitus ([place]) ?- Not insulin-dependent ? ?Chart reviewed.  ? ?Hospitalization from 10/22/2021 to 10/30/2021: Patient was admitted for altered mental status presumed secondary to mild DKA with acute kidney injury given that patient had anion gap metabolic acidosis, ketonuria, this was presumed possibly due to [place] as patient has never been on insulin.  Patient was also noted to have atrial fibrillation with RVR, started on Cardizem drip. Patient was admitted to stepdown, started on insulin drip, DKA resolved and patient was transferred to medical floor. During the time of admission, patient had blood pressure low to normotensive with stable MAP, prompting additional day and stepdown prior to transfer to medical floor. Patient was also started on short acting oral Cardizem on 10/24/2021 with reasonable heart rate control and stable blood pressure. ? ?DVT prophylaxis: None ?Code Status: DNR/DNI ?Diet: Comfort feeds ?Family Communication: Updated and discussed extensively with family at bedside: Angela Patterson (son), and daughters: Angela Patterson ?Disposition Plan: Anticipate hospital death ?Consults called: Spiritual, TOC ?Admission status: Inpatient, palliative care ? ?Past Medical History:  ?Diagnosis Date  ? Acute metabolic  encephalopathy   ? AKI (acute kidney injury) ([place]) 10/22/2021  ? Allergy   ? Chicken pox   ? CVA (cerebral vascular accident) [facility])   ? Diabetes mellitus ([place])   ? DKA (diabetic ketoacidosis) ([place]) 10/22/2021  ? Hypercholesterolemia   ? Hypernatremia 10/23/2021  ? Hypertension   ? Leukocytosis 10/22/2021  ? Nausea and vomiting 10/22/2021  ? SIRS due to infectious process with acute organ dysfunction ([place]) 10/22/2021  ? ?Past Surgical History:  ?Procedure Laterality Date  ? ABDOMINAL HYSTERECTOMY  1971  ? excessive bleeding  ? APPENDECTOMY  age 3  ? BREAST LUMPECTOMY  1958  ? benign  ? [place] OF UTERUS  1970  ? ?Social History:  reports that she has never smoked. She has never used smokeless tobacco. She reports that she does not drink alcohol and does not use drugs. ? ?Allergies  ?Allergen Reactions  ? Actos [Pioglitazone] Swelling  ? Contrast Media [Iodinated Contrast Media] Swelling  ? Prandin [Repaglinide] Swelling  ? Pravastatin Sodium   ?  cramps  ? ?Family History  ?Problem Relation Age of Onset  ? Liver cancer Father   ? Stroke Mother   ? Heart attack Brother   ? Hypertension Daughter   ? Hypertension Son   ? Hypertension Daughter   ? Cancer Grandchild   ?     breast  ? Diabetes  Grandchild   ? Breast cancer Neg Hx   ? Colon cancer Neg Hx   ? ?Family history: Family history reviewed and not pertinent. ? ?Prior to Admission medications   ?Medication Sig Start Date End Date Taking? Authorizing Provider  ?ascorbic acid (VITAMIN C) 500 MG tablet Take 500 mg by mouth daily.   Yes [provider]  ?Cholecalciferol (VITAMIN D) 2000 units tablet Take 2,000 Units by mouth daily.    Yes [provider]  ?clopidogrel (PLAVIX) 75 MG tablet TAKE 1 TABLET(75 MG) BY MOUTH DAILY 10/11/21  Yes Kennyth Arnold, FNP  ?Cranberry 500 MG CAPS Take 500 mg by mouth daily.   Yes [provider]  ?diltiazem (CARDIZEM CD) 240 MG 24 hr capsule Take 1 capsule (240 mg total) by mouth daily. 10/29/21  Yes  Nicole Kindred A, DO  ?insulin aspart (NOVOLOG) 100 UNIT/ML injection Inject 0-9 Units into the skin 3 (three) times daily with meals. Take 0-9 units 3 times daily with meals as follows: ?If CBG 70-120: 0 uni

## 2021-11-02 NOTE — Assessment & Plan Note (Addendum)
Patient with comfort care measures.  As needed medications for comfort.   ?Hospice bed available on 4/25, however patient is now nonresponsive and has periods of apnea.   ?Family declined, because of concern that she is actively dying.  ?Anticipate hospital death. ?

## 2021-11-03 ENCOUNTER — Encounter: Payer: Self-pay | Admitting: Internal Medicine

## 2021-11-03 ENCOUNTER — Non-Acute Institutional Stay: Payer: Medicare Other | Admitting: Primary Care

## 2021-11-03 DIAGNOSIS — R112 Nausea with vomiting, unspecified: Secondary | ICD-10-CM

## 2021-11-03 DIAGNOSIS — I480 Paroxysmal atrial fibrillation: Secondary | ICD-10-CM

## 2021-11-03 DIAGNOSIS — E1169 Type 2 diabetes mellitus with other specified complication: Secondary | ICD-10-CM

## 2021-11-03 DIAGNOSIS — D696 Thrombocytopenia, unspecified: Secondary | ICD-10-CM

## 2021-11-03 DIAGNOSIS — L89301 Pressure ulcer of unspecified buttock, stage 1: Secondary | ICD-10-CM

## 2021-11-03 DIAGNOSIS — E43 Unspecified severe protein-calorie malnutrition: Secondary | ICD-10-CM | POA: Diagnosis not present

## 2021-11-03 DIAGNOSIS — Z515 Encounter for palliative care: Secondary | ICD-10-CM | POA: Diagnosis not present

## 2021-11-03 DIAGNOSIS — R9431 Abnormal electrocardiogram [ECG] [EKG]: Secondary | ICD-10-CM

## 2021-11-03 DIAGNOSIS — E785 Hyperlipidemia, unspecified: Secondary | ICD-10-CM

## 2021-11-03 NOTE — Progress Notes (Signed)
?  Progress Note ? ? ?Patient: Angela Patterson POI:[id number] DOB: 1921-05-01 DOA: 11/11/2021     1 ?DOS: the patient was seen and examined on 11/03/2021 ?  ? ? ?Assessment and Plan: ?* End of life care ?Patient with comfort care measures.  As needed medications for comfort.  Assessed by hospice home liaison.  Currently no hospice home beds. ? ?Type 2 diabetes mellitus with hyperlipidemia ([place]) ?Recent hospitalization for DKA.  No further treatments for diabetes since comfort care measures.  Holding cholesterol medication at this time. ? ?Protein-calorie malnutrition, severe ?With failure to thrive patient has severe protein calorie malnutrition. ? ?Nausea & vomiting ?As needed medications for nausea vomiting. ? ?Thrombocytopenia ([place]) ?Last platelet count 125. ? ?Pressure injury of buttock, stage 1 ?Present on admission, see full description below. ? ?Prolonged QT interval ?No treatment for this, since comfort care measures. ? ?Paroxysmal atrial fibrillation ([place]) ?No further treatment for this either. ? ? ? ? ?  ? ?Subjective: Patient moved her arm to sternal rub.  Resting comfortably.  As per family she did sit up last night and vomited. ? ?Physical Exam: ?Vitals:  ? 11/03/2021 1400 11/12/2021 1430 11/09/2021 1500 11/03/21 0735  ?BP: (!) 128/95 124/78 115/70 121/76  ?Pulse: (!) 117 85 (!) 48 (!) 110  ?Resp: 16 13 (!) 21 16  ?Temp:      ?TempSrc:      ?SpO2: 100% 95% 96% 97%  ? ?Physical Exam ?HENT:  ?   Head: Normocephalic.  ?   Mouth/Throat:  ?   Pharynx: No oropharyngeal exudate.  ?Eyes:  ?   General: Lids are normal.  ?   Conjunctiva/sclera: Conjunctivae normal.  ?Cardiovascular:  ?   Rate and Rhythm: Normal rate and regular rhythm.  ?   Heart sounds: Normal heart sounds, S1 normal and S2 normal.  ?Pulmonary:  ?   Breath sounds: No decreased breath sounds, wheezing, rhonchi or rales.  ?Musculoskeletal:  ?   Right lower leg: No swelling.  ?   Left lower leg: No swelling.  ?Skin: ?   General: Skin is warm.  ?   Findings: No  rash.  ?   Comments: Chronic lower extremity skin discoloration.  ?Neurological:  ?   Mental Status: She is lethargic.  ?   Comments: Move left arm to sternal rub.  ?  ?Data Reviewed: ?Normal laboratory and radiological data with being comfort care measures. ? ?Family Communication: Spoke with her niece at the bedside this morning and daughter on the phone this afternoon ? ?Disposition: ?Status is: Inpatient ?Remains inpatient appropriate because: Receiving comfort care measures here in the hospital ? ?Planned Discharge Destination: Hospice home when bed available ? ?Author: ?Loletha Grayer, MD ?11/03/2021 3:28 PM ? ?For on call review [email or web address].  ?

## 2021-11-03 NOTE — Assessment & Plan Note (Signed)
Last platelet count 125. ?

## 2021-11-03 NOTE — TOC Progression Note (Signed)
Transition of Care (TOC) - Progression Note  ? ? ?Patient Details  ?Name: MINIYA MIGUEZ ?MRN: [id number] ?Date of Birth: 05-16-1921 ? ?Transition of Care (TOC) CM/SW Contact  ?Tansy Lorek A Emrys Mck[place]ie, LCSW ?Phone Number: ?11/03/2021, 11:40 AM ? ?Clinical Narrative: Pt will transfer to Hospice home when bed available.    ? ? ? ?  ?  ? ?Expected Discharge Plan and Services ?  ?  ?  ?  ?  ?                ?  ?  ?  ?  ?  ?  ?  ?  ?  ?  ? ? ?Social Determinants of Health (SDOH) Interventions ?  ? ?Readmission Risk Interventions ?   ? View : No data to display.  ?  ?  ?  ? ? ?

## 2021-11-03 NOTE — Assessment & Plan Note (Signed)
No treatment for this, since comfort care measures. ?

## 2021-11-03 NOTE — Assessment & Plan Note (Deleted)
No further treatment for this either. ?

## 2021-11-03 NOTE — Assessment & Plan Note (Signed)
With failure to thrive patient has severe protein calorie malnutrition. ?

## 2021-11-03 NOTE — Assessment & Plan Note (Addendum)
Continue Zofran as needed for nausea and vomiting. ?

## 2021-11-03 NOTE — Progress Notes (Addendum)
Manufacturing engineer [facility]) Hospital Liaison Note ? ?Received request for family interest in [place]. Visited patient at bedside and spoke with daughter/Teresa to confirm interest and explain services. ? ?Approval for Hospice Home is determined by [facility] MD. Once [facility] MD has determined Hospice Home eligibility, [place] will update hospital staff and family. Eligibility is approved  ? ?Please do not hesitate to call with any hospice related questions.  ?  ?Thank you for the opportunity to participate in this patient's care. ? ?Daphene Calamity, MSW ?[place]  ?[phone] ? ?

## 2021-11-03 NOTE — Assessment & Plan Note (Signed)
Present on admission, see full description below. 

## 2021-11-04 DIAGNOSIS — Z515 Encounter for palliative care: Secondary | ICD-10-CM | POA: Diagnosis not present

## 2021-11-04 DIAGNOSIS — E1169 Type 2 diabetes mellitus with other specified complication: Secondary | ICD-10-CM | POA: Diagnosis not present

## 2021-11-04 DIAGNOSIS — E43 Unspecified severe protein-calorie malnutrition: Secondary | ICD-10-CM | POA: Diagnosis not present

## 2021-11-04 DIAGNOSIS — L89321 Pressure ulcer of left buttock, stage 1: Secondary | ICD-10-CM

## 2021-11-04 DIAGNOSIS — R112 Nausea with vomiting, unspecified: Secondary | ICD-10-CM | POA: Diagnosis not present

## 2021-11-04 DIAGNOSIS — I4891 Unspecified atrial fibrillation: Secondary | ICD-10-CM

## 2021-11-04 DIAGNOSIS — E871 Hypo-osmolality and hyponatremia: Secondary | ICD-10-CM

## 2021-11-04 NOTE — Assessment & Plan Note (Signed)
Last sodium only two points lower than normal range. ?

## 2021-11-04 NOTE — Progress Notes (Signed)
?Progress Note ? ? ?Patient: Angela Patterson GYI:[id number] DOB: Dec 17, 1920 DOA: 11/08/2021     2 ?DOS: the patient was seen and examined on 11/04/2021 ?  ?Brief hospital course: ?[name] is a 88 year old female with history of CVA, non-insulin-dependent diabetes mellitus, dementia, paroxysmal atrial fibrillation, hypertension, anxiety, severe protein calorie malnutrition, pressure injury of the skin, hyperlipidemia, recent hospitalization for altered mental status secondary to DKA, recent AKI, recent hypernatremia, recent sirs, who presents emergency department from [facility] for chief concerns of altered mental status, worsening weakness, vomiting. ? ?The patient was made comfort care measures by the family.  Currently awaiting hospice home bed availability.  The patient is lethargic and not opening her eyes but able to squeeze my hands on 11/04/2021 to command. ? ? ? ? ? ?Assessment and Plan: ?* End of life care ?Patient with comfort care measures.  As needed medications for comfort.  Currently no hospice home beds as of 11/04/2021. ? ?Type 2 diabetes mellitus with hyperlipidemia ([place]) ?Recent hospitalization for DKA.  No further treatments for diabetes since comfort care measures.  Holding cholesterol medication at this time. ? ?Protein-calorie malnutrition, severe ?With failure to thrive patient has severe protein calorie malnutrition. ? ?Nausea & vomiting ?As needed medications for nausea vomiting. ? ?Thrombocytopenia ([place]) ?Last platelet count 125. ? ?Hyponatremia ?Last sodium only two points lower than normal range. ? ?Atrial fibrillation with rapid ventricular response ([place]) ?Heart rate elevated this morning.  No treatment for this with being comfort care measures. ? ?Pressure injury of buttock, stage 1 ?Present on admission, see full description below. ? ?Prolonged QT interval ?No treatment for this, since comfort care measures. ? ? ? ?  ? ?Subjective: Patient kept her eyes closed.  Patient did not  talk to me.  As per the family she was able to talk with them a little bit last night.  The patient was able to squeeze my hands to command this morning. ? ?Physical Exam: ?Vitals:  ? 11/06/2021 1430 10/21/2021 1500 11/03/21 0735 11/03/21 1627  ?BP: 124/78 115/70 121/76 123/83  ?Pulse: 85 (!) 48 (!) 110 (!) 114  ?Resp: 13 (!) '21 16 16  '$ ?Temp:    99.2 ?F (37.3 ?C)  ?TempSrc:    Axillary  ?SpO2: 95% 96% 97% 96%  ? ?Physical Exam ?HENT:  ?   Head: Normocephalic.  ?   Mouth/Throat:  ?   Pharynx: No oropharyngeal exudate.  ?Eyes:  ?   General: Lids are normal.  ?   Conjunctiva/sclera: Conjunctivae normal.  ?Cardiovascular:  ?   Rate and Rhythm: Tachycardia present. Rhythm irregularly irregular.  ?   Heart sounds: Normal heart sounds, S1 normal and S2 normal.  ?Pulmonary:  ?   Breath sounds: No decreased breath sounds, wheezing, rhonchi or rales.  ?Musculoskeletal:  ?   Right lower leg: No swelling.  ?   Left lower leg: No swelling.  ?Skin: ?   General: Skin is warm.  ?   Findings: No rash.  ?   Comments: Chronic lower extremity skin discoloration.  ?Neurological:  ?   Mental Status: She is lethargic.  ?   Comments: Today able to squeeze my hands to command.  ?  ?Data Reviewed: ?No further lab draw ? ?Family Communication: 3 family members at bedside this morning ? ?Disposition: ?Status is: Inpatient ?Remains inpatient appropriate because: Receiving comfort care measures here in the hospital until hospice home bed availability opens up. ? ?Planned Discharge Destination: Hospice facility once a bed opens  up ? ?Author: ?Loletha Grayer, MD ?11/04/2021 2:17 PM ? ?For on call review [email or web address].  ?

## 2021-11-04 NOTE — Assessment & Plan Note (Signed)
Heart rate elevated this morning.  No treatment for this with being comfort care measures. ?

## 2021-11-04 NOTE — Progress Notes (Signed)
[place] [facility]) Hospital Liaison Note ?  ?Unfortunately, Hospice Home is not able to offer a room today. Family and [facility] Manager aware hospital liaison will follow up tomorrow or sooner if a room becomes available.  ?  ?Please do not hesitate to call with any hospice related questions.  ?  ?Thank you for the opportunity to participate in this patient's care. ?  ?Daphene Calamity, MSW ?[place] ?[phone] ?Daphene Calamity, MSW  ?

## 2021-11-05 DIAGNOSIS — Z515 Encounter for palliative care: Secondary | ICD-10-CM | POA: Diagnosis not present

## 2021-11-05 NOTE — TOC Progression Note (Signed)
Transition of Care (TOC) - Progression Note  ? ? ?Patient Details  ?Name: YENY SCHMOLL ?MRN: [id number] ?Date of Birth: [birth year] ? ?Transition of Care (TOC) CM/SW Contact  ?Pete Pelt, RN ?Phone Number: ?11/05/2021, 11:34 AM ? ?Clinical Narrative:   As per [facility] there are no inpatient hospice beds for patient today.  ? ? ? ?  ?  ? ?Expected Discharge Plan and Services ?  ?  ?  ?  ?  ?                ?  ?  ?  ?  ?  ?  ?  ?  ?  ?  ? ? ?Social Determinants of Health (SDOH) Interventions ?  ? ?Readmission Risk Interventions ?   ? View : No data to display.  ?  ?  ?  ? ? ?

## 2021-11-05 NOTE — Progress Notes (Signed)
[place] [facility]) Hospital Liaison Note ?  ?Unfortunately, Hospice Home is not able to offer a room today. Family and [facility] Manager aware hospital liaison will follow up tomorrow or sooner if a room becomes available.  ?  ?Please do not hesitate to call with any hospice related questions.  ?  ?Thank you for the opportunity to participate in this patient's care. ?  ?Daphene Calamity, MSW ?[place] ?[phone] ?

## 2021-11-05 NOTE — Telephone Encounter (Signed)
Called and spoke to Angela Patterson.  You wanted this sent back to cancel the pt assistance.  Thanks.  ?

## 2021-11-05 NOTE — Care Management Important Message (Signed)
Important Message ? ?Patient Details  ?Name: Angela Patterson ?MRN: [id number] ?Date of Birth: [birth year] ? ? ?Medicare Important Message Given:  Other (see comment) ? ?Disposition to discharge to hospice home pending bed availability.  Medicare IM withheld at this time out of respect for patient and family. ? ? ?Dannette Barbara ?11/05/2021, 8:14 AM ?

## 2021-11-05 NOTE — Telephone Encounter (Signed)
Patient is in Hospice care at the hospital and palliative care, they are wanting [place] to stop her patient assistance meds, but they would just like to speak with you personally since you have been her provider so long. Call [name]. Or the cousin number listed in below message.  ?

## 2021-11-05 NOTE — Progress Notes (Signed)
[place] at [facility] ? ? ?PATIENT NAME: Angela Patterson   ? ?MR#:  [id number] ? ?DATE OF BIRTH:  February 12, 1921 ? ?SUBJECTIVE:  ?patient resting quietly. Family stepped out for meal. Patient receiving comfort care meds per RN ? ? ? ?VITALS:  ?Blood pressure 105/76, pulse (!) 131, temperature 98.8 ?F (37.1 ?C), resp. rate 12, last menstrual period 07/21/1966, SpO2 92 %. ? ?PHYSICAL EXAMINATION:  ?limited ?GENERAL:  88 y.o.-year-old patient lying in the bed with no acute distress. Chronically ill ?LUNGS: Normal breath sounds bilaterally ?CARDIOVASCULAR: S1, S2 normal.  ?NEUROLOGIC: lethargic noncommunicative ? ? ?LABORATORY PANEL:  ?CBC ?Recent Labs  ?Lab 10/20/2021 ?1139  ?WBC 12.7*  ?HGB 13.9  ?HCT 44.4  ?PLT 125*  ? ? ?Chemistries  ?Recent Labs  ?Lab 10/30/21 ?7544  ?NA 133*  ?K 4.2  ?CL 98  ?CO2 29  ?GLUCOSE 180*  ?BUN 21  ?CREATININE 0.55  ?CALCIUM 8.6*  ?MG 2.0  ? ? ?Assessment and Plan ?[name] is a 88 year old female with history of CVA, non-insulin-dependent diabetes mellitus, dementia, paroxysmal atrial fibrillation, hypertension, anxiety, severe protein calorie malnutrition, pressure injury of the skin, hyperlipidemia, recent hospitalization for altered mental status secondary to DKA, recent AKI, recent hypernatremia, recent sirs, who presents emergency department from [facility] for chief concerns of altered mental status, worsening weakness, vomiting. ? The patient was made comfort care measures by the family. Currently awaiting hospice home bed availability.   ? ?End of life care ?Patient with comfort care measures.  As needed medications for comfort.  Currently no hospice home beds as of 11/05/2021. ?  ?Type 2 diabetes mellitus with hyperlipidemia ([place]) ?Recent hospitalization for DKA.  No further treatments for diabetes since comfort care measures.  Holding cholesterol medication at this time. ?  ?Protein-calorie malnutrition, severe ?With failure to thrive patient has  severe protein calorie malnutrition. ?  ?Nausea & vomiting ?As needed medications for nausea vomiting. ?  ?Thrombocytopenia ([place]) ?Last platelet count 125. ?  ?Hyponatremia ?Last sodium only two points lower than normal range. ?  ?Atrial fibrillation with rapid ventricular response ([place]) ?Heart rate elevated this morning.  No treatment for this with being comfort care measures. ?  ?Pressure injury of buttock, stage 1 ?Present on admission, see full description below. ?  ?Prolonged QT interval ?No treatment for this, since comfort care measures. ? ?Family communication : dter [name] ?CODE STATUS: DNR DNI ?Status is: Inpatient ?Remains inpatient appropriate because: continue comfort care. Awaiting hospice bed ?  ? ?TOTAL TIME TAKING CARE OF THIS PATIENT: 15 minutes.  ?>50% time spent on counselling and coordination of care ? ?Note: This dictation was prepared with Dragon dictation along with smaller phrase technology. Any transcriptional errors that result from this process are unintentional. ? ?Fritzi Mandes M.D  ? ? ?[facility]  ? ?CC: ?Primary care physician; Einar Pheasant, MD  ?

## 2021-11-06 DIAGNOSIS — Z515 Encounter for palliative care: Secondary | ICD-10-CM | POA: Diagnosis not present

## 2021-11-06 NOTE — TOC Progression Note (Signed)
Transition of Care (TOC) - Progression Note  ? ? ?Patient Details  ?Name: Angela Patterson ?MRN: [id number] ?Date of Birth: 07-Jun-1921 ? ?Transition of Care (TOC) CM/SW Contact  ?Pete Pelt, RN ?Phone Number: ?11/06/2021, 10:11 AM ? ?Clinical Narrative:  no inpatient hospice beds today per [facility]  ? ? ? ?  ?  ? ?Expected Discharge Plan and Services ?  ?  ?  ?  ?  ?                ?  ?  ?  ?  ?  ?  ?  ?  ?  ?  ? ? ?Social Determinants of Health (SDOH) Interventions ?  ? ?Readmission Risk Interventions ?   ? View : No data to display.  ?  ?  ?  ? ? ?

## 2021-11-06 NOTE — Progress Notes (Signed)
[place] at [facility] ? ? ?PATIENT NAME: Angela Patterson   ? ?MR#:  [id number] ? ?DATE OF BIRTH:  1921/03/17 ? ?SUBJECTIVE:  ?patient resting quietly. Family  at baseline ? Patient receiving comfort care meds per RN ? ?VITALS:  ?Blood pressure (!) 157/101, pulse (!) 129, temperature (!) 97.5 ?F (36.4 ?C), temperature source Oral, resp. rate 20, last menstrual period 07/21/1966, SpO2 93 %. ? ?PHYSICAL EXAMINATION:  ?limited ?GENERAL:  88 y.o.-year-old patient lying in the bed with no acute distress. Chronically ill ?LUNGS: Normal breath sounds bilaterally ?CARDIOVASCULAR: S1, S2 normal.  ?NEUROLOGIC: lethargic noncommunicative ? ? ?LABORATORY PANEL:  ?CBC ?Recent Labs  ?Lab 10/30/2021 ?1139  ?WBC 12.7*  ?HGB 13.9  ?HCT 44.4  ?PLT 125*  ? ? ? ?Chemistries  ?No results for input(s): NA, K, CL, CO2, GLUCOSE, BUN, CREATININE, CALCIUM, MG, AST, ALT, ALKPHOS, BILITOT in the last 168 hours. ? ?Invalid input(s): GFRCGP ? ? ?Assessment and Plan ?Ms. Tonika Eden is a 88 year old female with history of CVA, non-insulin-dependent diabetes mellitus, dementia, paroxysmal atrial fibrillation, hypertension, anxiety, severe protein calorie malnutrition, pressure injury of the skin, hyperlipidemia, recent hospitalization for altered mental status secondary to DKA, recent AKI, recent hypernatremia, recent sirs, who presents emergency department from [facility] for chief concerns of altered mental status, worsening weakness, vomiting. ? The patient was made comfort care measures by the family. Currently awaiting hospice home bed availability.   ? ?End of life care ?Patient with comfort care measures.  As needed medications for comfort.  Currently no hospice home beds as of 11/05/2021. ?  ?Type 2 diabetes mellitus with hyperlipidemia ([place]) ?Recent hospitalization for DKA.  No further treatments for diabetes since comfort care measures.  Holding cholesterol medication at this time. ?  ?Protein-calorie  malnutrition, severe ?With failure to thrive patient has severe protein calorie malnutrition. ?  ?Thrombocytopenia ([place]) ?Last platelet count 125. ?  ?Atrial fibrillation with rapid ventricular response ([place]) ?Heart rate elevated this morning.  No treatment for this with being comfort care measures. ?  ?Pressure injury of buttock, stage 1 ?Present on admission, see full description below. ?  ?Family communication : dter [name] ?CODE STATUS: DNR DNI ?Status is: Inpatient ?Remains inpatient appropriate because: continue comfort care. Awaiting hospice bed ?  ? ?TOTAL TIME TAKING CARE OF THIS PATIENT: 15 minutes.  ?>50% time spent on counselling and coordination of care ? ?Note: This dictation was prepared with Dragon dictation along with smaller phrase technology. Any transcriptional errors that result from this process are unintentional. ? ?Fritzi Mandes M.D  ? ? ?[facility]  ? ?CC: ?Primary care physician; Einar Pheasant, MD  ?

## 2021-11-06 NOTE — Progress Notes (Signed)
Nutrition Brief Note  Chart reviewed. Pt now transitioning to comfort care.  No further nutrition interventions planned at this time.  Please re-consult as needed.   Asher Torpey W, RD, LDN, CDCES Registered Dietitian II Certified Diabetes Care and Education Specialist Please refer to [facility] for RD and/or RD on-call/weekend/after hours pager   

## 2021-11-06 NOTE — Progress Notes (Signed)
[place] [facility]) Hospital Liaison Note ?  ?Unfortunately, Hospice Home is not able to offer a room today. Family and [facility] Manager aware hospital liaison will follow up tomorrow or sooner if a room becomes available.  ?  ?Please do not hesitate to call with any hospice related questions.  ?  ?Thank you for the opportunity to participate in this patient's care. ?  ?Daphene Calamity, MSW ?[place] ?[phone] ?

## 2021-11-07 DIAGNOSIS — Z515 Encounter for palliative care: Secondary | ICD-10-CM | POA: Diagnosis not present

## 2021-11-07 NOTE — Progress Notes (Signed)
[place] at [facility] ? ? ?PATIENT NAME: Angela Patterson   ? ?MR#:  [id number] ? ?DATE OF BIRTH:  Jan 05, 1921 ? ?SUBJECTIVE:  ?patient resting quietly. Family  at baseline ? Patient receiving comfort care meds per RN ? ?VITALS:  ?Blood pressure 95/75, pulse (!) 133, temperature (!) 97.5 ?F (36.4 ?C), resp. rate 10, last menstrual period 07/21/1966, SpO2 94 %. ? ?PHYSICAL EXAMINATION:  ?limited ?GENERAL:  88 y.o.-year-old patient lying in the bed with no acute distress. Chronically ill ?LUNGS: Normal breath sounds bilaterally apneic  spells ?CARDIOVASCULAR: S1, S2 normal.  ?NEUROLOGIC: lethargic noncommunicative ? ?LABORATORY PANEL:  ?CBC ?Recent Labs  ?Lab 11/13/2021 ?1139  ?WBC 12.7*  ?HGB 13.9  ?HCT 44.4  ?PLT 125*  ? ? ? ?Chemistries  ?No results for input(s): NA, K, CL, CO2, GLUCOSE, BUN, CREATININE, CALCIUM, MG, AST, ALT, ALKPHOS, BILITOT in the last 168 hours. ? ?Invalid input(s): GFRCGP ? ? ?Assessment and Plan ?[name] is a 88 year old female with history of CVA, non-insulin-dependent diabetes mellitus, dementia, paroxysmal atrial fibrillation, hypertension, anxiety, severe protein calorie malnutrition, pressure injury of the skin, hyperlipidemia, recent hospitalization for altered mental status secondary to DKA, recent AKI, recent hypernatremia, recent sirs, who presents emergency department from [facility] for chief concerns of altered mental status, worsening weakness, vomiting. ? The patient was made comfort care measures by the family. Currently awaiting hospice home bed availability.   ? ?End of life care ?Patient with comfort care measures.  As needed medications for comfort.  Currently no hospice home beds as of 11/05/2021. ?  ?Type 2 diabetes mellitus with hyperlipidemia ([place]) ?Recent hospitalization for DKA.  No further treatments for diabetes since comfort care measures.  Holding cholesterol medication at this time. ?  ?Protein-calorie malnutrition, severe ?With  failure to thrive patient has severe protein calorie malnutrition. ?  ?Atrial fibrillation with rapid ventricular response ([place]) ?Heart rate elevated this morning.  No treatment for this with being comfort care measures. ?  ?Pressure injury of buttock, stage 1 ?Present on admission, see full description below. ?  ?Family communication : dter [name] ?CODE STATUS: DNR DNI ?Status is: Inpatient ?Remains inpatient appropriate because: continue comfort care. Awaiting hospice bed ?  ? ?TOTAL TIME TAKING CARE OF THIS PATIENT: 15 minutes.  ?>50% time spent on counselling and coordination of care ? ?Note: This dictation was prepared with Dragon dictation along with smaller phrase technology. Any transcriptional errors that result from this process are unintentional. ? ?Fritzi Mandes M.D  ? ? ?[facility]  ? ?CC: ?Primary care physician; Einar Pheasant, MD  ?

## 2021-11-07 NOTE — Progress Notes (Signed)
[place] [facility]) Hospital Liaison Note ?  ?Unfortunately, Hospice Home is not able to offer a room today. Family and [facility] Manager aware hospital liaison will follow up tomorrow or sooner if a room becomes available. HLT to continue to monitor changes in patient status.  ?  ?Please do not hesitate to call with any hospice related questions.  ?  ?Thank you for the opportunity to participate in this patient's care. ?  ?Daphene Calamity, MSW ?[place] ?[phone] ?

## 2021-11-07 NOTE — Care Management Important Message (Signed)
Important Message ? ?Patient Details  ?Name: Angela Patterson ?MRN: [id number] ?Date of Birth: March 22, 1921 ? ? ?Medicare Important Message Given:  Other (see comment) ? ?Disposition to discharge to hospice home pending bed availability.  Medicare IM withheld at this time out of respect for patient and family. ? ? ?Dannette Barbara ?11/07/2021, 8:49 AM ?

## 2021-11-08 DIAGNOSIS — Z515 Encounter for palliative care: Secondary | ICD-10-CM | POA: Diagnosis not present

## 2021-11-08 NOTE — Progress Notes (Signed)
[place] at [facility] ? ? ?PATIENT NAME: Angela Patterson   ? ?MR#:  [id number] ? ?DATE OF BIRTH:  1920-12-09 ? ?SUBJECTIVE:  ?patient resting quietly. Family  at baseline ?On comfort measures ? ?VITALS:  ?Blood pressure (!) 176/116, pulse (!) 145, temperature (!) 97.5 ?F (36.4 ?C), resp. rate (!) 7, last menstrual period 07/21/1966, SpO2 98 %. ? ?PHYSICAL EXAMINATION:  ?limited ?GENERAL:  88 y.o.-year-old patient lying in the bed with no acute distress. Chronically ill ?LUNGS: Normal breath sounds bilaterally apneic  spells ?CARDIOVASCULAR: S1, S2 normal.  ?NEUROLOGIC: lethargic noncommunicative ? ?LABORATORY PANEL:  ?CBC ?Recent Labs  ?Lab 10/18/2021 ?1139  ?WBC 12.7*  ?HGB 13.9  ?HCT 44.4  ?PLT 125*  ? ? ? ?Chemistries  ?No results for input(s): NA, K, CL, CO2, GLUCOSE, BUN, CREATININE, CALCIUM, MG, AST, ALT, ALKPHOS, BILITOT in the last 168 hours. ? ?Invalid input(s): GFRCGP ? ? ?Assessment and Plan ?Angela Patterson is a 88 year old female with history of CVA, non-insulin-dependent diabetes mellitus, dementia, paroxysmal atrial fibrillation, hypertension, anxiety, severe protein calorie malnutrition, pressure injury of the skin, hyperlipidemia, recent hospitalization for altered mental status secondary to DKA, recent AKI, recent hypernatremia, recent sirs, who presents emergency department from [facility] for chief concerns of altered mental status, worsening weakness, vomiting. ? The patient was made comfort care measures by the family. Currently awaiting hospice home bed availability.   ? ?End of life care ?Patient with comfort care measures.  As needed medications for comfort.  Currently no hospice home beds as of 11/05/2021. ?  ?Type 2 diabetes mellitus with hyperlipidemia ([place]) ?Recent hospitalization for DKA.  No further treatments for diabetes since comfort care measures.   ? ?Protein-calorie malnutrition, severe ?With failure to thrive patient has severe protein calorie  malnutrition. ?  ?Atrial fibrillation with rapid ventricular response ([place]) ?Tachycardia  No treatment for this with being comfort care measures. ?  ?Pressure injury of buttock, stage 1 ?Present on admission ?  ?Family communication : family at bedside ?CODE STATUS: DNR DNI ?Status is: Inpatient ?Remains inpatient appropriate because: continue comfort care. Awaiting hospice bed ?  ? ?TOTAL TIME TAKING CARE OF THIS PATIENT: 15 minutes.  ?>50% time spent on counselling and coordination of care ? ?Note: This dictation was prepared with Dragon dictation along with smaller phrase technology. Any transcriptional errors that result from this process are unintentional. ? ?Angela Patterson M.D  ? ? ?[facility]  ? ?CC: ?Primary care physician; Angela Pheasant, MD  ?

## 2021-11-08 NOTE — Progress Notes (Signed)
[place] [facility]) Hospital Liaison Note ?  ?Unfortunately, Hospice Home is not able to offer a room today. Family and [facility] Manager aware hospital liaison will follow up tomorrow or sooner if a room becomes available. HLT to continue to monitor changes in patient status.  ?  ?Please do not hesitate to call with any hospice related questions.  ?  ?Thank you for the opportunity to participate in this patient's care. ? ?Jhonnie Garner, BSN, Therapist, sports, WTA-C ?Hospice hospital liaison  ?[phone] ?

## 2021-11-09 DIAGNOSIS — Z515 Encounter for palliative care: Secondary | ICD-10-CM | POA: Diagnosis not present

## 2021-11-09 MED ORDER — HYDROMORPHONE HCL 1 MG/ML IJ SOLN
1.0000 mg | INTRAMUSCULAR | Status: DC | PRN
  Administered 2021-11-09 – 2021-11-13 (×12): 1 mg via INTRAVENOUS
  Filled 2021-11-09 (×12): qty 1

## 2021-11-09 NOTE — Progress Notes (Signed)
Manufacturing engineer (ACC) ? ?There are no beds available at [facility] today. ? ?ACC will update as soon as there is an opening.  ? ?Venia Carbon BSN, RN ?[facility] Liaison  ?

## 2021-11-09 NOTE — TOC Progression Note (Signed)
Transition of Care (TOC) - Progression Note  ? ? ?Patient Details  ?Name: Angela Patterson ?MRN: [id number] ?Date of Birth: 02-12-1921 ? ?Transition of Care (TOC) CM/SW Contact  ?Izola Price, RN ?Phone Number: ?11/09/2021, 1:08 PM ? ?Clinical Narrative:  4/23: Per hospice liaison, Venia Carbon, there are not hospice bed available this am. She will contact CM when there is availability.  Simmie Davies RN CM  ? ? ? ?  ?  ? ?Expected Discharge Plan and Services ?  ?  ?  ?  ?  ?                ?  ?  ?  ?  ?  ?  ?  ?  ?  ?  ? ? ?Social Determinants of Health (SDOH) Interventions ?  ? ?Readmission Risk Interventions ?   ? View : No data to display.  ?  ?  ?  ? ? ?

## 2021-11-09 NOTE — Progress Notes (Signed)
[place] at [facility] ? ? ?PATIENT NAME: Angela Patterson   ? ?MR#:  [id number] ? ?DATE OF BIRTH:  03-Nov-1920 ? ?SUBJECTIVE:  ?patient resting quietly.  ?On comfort measures ? ?VITALS:  ?Blood pressure (!) 169/71, pulse 73, temperature 98.1 ?F (36.7 ?C), resp. rate (!) 8, last menstrual period 07/21/1966, SpO2 98 %. ? ?PHYSICAL EXAMINATION:  ?limited ?GENERAL:  88 y.o.-year-old patient lying in the bed with no acute distress. Chronically ill ?NEUROLOGIC: lethargic noncommunicative ? ?LABORATORY PANEL:  ?CBC ?Recent Labs  ?Lab 10/23/2021 ?1139  ?WBC 12.7*  ?HGB 13.9  ?HCT 44.4  ?PLT 125*  ? ? ? ?Chemistries  ?No results for input(s): NA, K, CL, CO2, GLUCOSE, BUN, CREATININE, CALCIUM, MG, AST, ALT, ALKPHOS, BILITOT in the last 168 hours. ? ?Invalid input(s): GFRCGP ? ? ?Assessment and Plan ?Ms. Kenedee Molesky is a 88 year old female with history of CVA, non-insulin-dependent diabetes mellitus, dementia, paroxysmal atrial fibrillation, hypertension, anxiety, severe protein calorie malnutrition, pressure injury of the skin, hyperlipidemia, recent hospitalization for altered mental status secondary to DKA, recent AKI, recent hypernatremia, recent sirs, who presents emergency department from [facility] for chief concerns of altered mental status, worsening weakness, vomiting. ? The patient was made comfort care measures by the family. Currently awaiting hospice home bed availability.   ? ?End of life care ?Patient with comfort care measures.  As needed medications for comfort.  Currently no hospice home beds as of 11/05/2021. ?  ?Type 2 diabetes mellitus with hyperlipidemia ([place]) ?Recent hospitalization for DKA.  No further treatments for diabetes since comfort care measures.   ? ?Protein-calorie malnutrition, severe ?With failure to thrive patient has severe protein calorie malnutrition. ?  ?Atrial fibrillation with rapid ventricular response ([place]) ?Tachycardia  No treatment for this with being  comfort care measures. ?  ?Pressure injury of buttock, stage 1 ?Present on admission ?  ?Family communication : family at bedside ?CODE STATUS: DNR DNI ?Status is: Inpatient ?Remains inpatient appropriate because: continue comfort care. Awaiting hospice bed ?  ? ?TOTAL TIME TAKING CARE OF THIS PATIENT: 15 minutes.  ?>50% time spent on counselling and coordination of care ? ?Note: This dictation was prepared with Dragon dictation along with smaller phrase technology. Any transcriptional errors that result from this process are unintentional. ? ?Fritzi Mandes M.D  ? ? ?[facility]  ? ?CC: ?Primary care physician; Einar Pheasant, MD  ?

## 2021-11-10 NOTE — TOC Progression Note (Signed)
Transition of Care (TOC) - Progression Note  ? ? ?Patient Details  ?Name: Angela Patterson ?MRN: [id number] ?Date of Birth: 04-17-1921 ? ?Transition of Care (TOC) CM/SW Contact  ?Pete Pelt, RN ?Phone Number: ?11/10/2021, 11:05 AM ? ?Clinical Narrative:  As per Lorayne Bender at [facility], no inpatient hospice beds today.   ? ? ? ?  ?  ? ?Expected Discharge Plan and Services ?  ?  ?  ?  ?  ?                ?  ?  ?  ?  ?  ?  ?  ?  ?  ?  ? ? ?Social Determinants of Health (SDOH) Interventions ?  ? ?Readmission Risk Interventions ?   ? View : No data to display.  ?  ?  ?  ? ? ?

## 2021-11-10 NOTE — Progress Notes (Signed)
[place] [facility]) Hospital Liaison Note ?  ?Unfortunately, Hospice Home is not able to offer a room today. Family and [facility] Manager aware hospital liaison will follow up tomorrow or sooner if a room becomes available. HLT to continue to monitor changes in patient status.  ?  ?Please do not hesitate to call with any hospice related questions.  ?  ?Thank you for the opportunity to participate in this patient's care. ?  ?Daphene Calamity, MSW ?[place] ?[phone] ?

## 2021-11-10 NOTE — Progress Notes (Signed)
[place] at [facility] ? ? ?PATIENT NAME: Angela Patterson   ? ?MR#:  [id number] ? ?DATE OF BIRTH:  Nov 08, 1920 ? ?SUBJECTIVE:  ?patient resting quietly.  ?On comfort measures ? ?PHYSICAL EXAMINATION:  ?limited ?GENERAL:  88 y.o.-year-old patient lying in the bed with no acute distress. Chronically ill ?NEUROLOGIC: lethargic noncommunicative ? ?Assessment and Plan ?Ms. Chaunice Obie is a 88 year old female with history of CVA, non-insulin-dependent diabetes mellitus, dementia, paroxysmal atrial fibrillation, hypertension, anxiety, severe protein calorie malnutrition, pressure injury of the skin, hyperlipidemia, recent hospitalization for altered mental status secondary to DKA, recent AKI, recent hypernatremia, recent sirs, who presents emergency department from [facility] for chief concerns of altered mental status, worsening weakness, vomiting. ? The patient was made comfort care measures by the family. Currently awaiting hospice home bed availability.   ? ?End of life care ?Patient with comfort care measures.  As needed medications for comfort.  Currently no hospice home beds. ?  ?Type 2 diabetes mellitus with hyperlipidemia ([place])   ? ?Protein-calorie malnutrition, severe ?With failure to thrive  ?  ?Atrial fibrillation with rapid ventricular response ([place]) ?Tachycardia   ?  ?Pressure injury of buttock, stage 1 ?Present on admission ?  ?Family communication : family at bedside ?CODE STATUS: DNR DNI ?Status is: Inpatient ?Remains inpatient appropriate because: continue comfort care. Awaiting hospice bed ?  ? ?TOTAL TIME TAKING CARE OF THIS PATIENT: 15 minutes.  ?>50% time spent on counselling and coordination of care ? ?Note: This dictation was prepared with Dragon dictation along with smaller phrase technology. Any transcriptional errors that result from this process are unintentional. ? ?Fritzi Mandes M.D  ? ? ?[facility]  ? ?CC: ?Primary care physician; Einar Pheasant, MD  ?

## 2021-11-11 ENCOUNTER — Encounter: Payer: Self-pay | Admitting: Internal Medicine

## 2021-11-11 DIAGNOSIS — Z515 Encounter for palliative care: Secondary | ICD-10-CM | POA: Diagnosis not present

## 2021-11-11 NOTE — Progress Notes (Signed)
[facility] Progress Note ? ?Patient: Angela Patterson    TWS:[id number]  DOA: 10/19/2021    ?Date of Service: the patient was seen and examined on 11/11/2021 ? ?Brief hospital course: ?[name] is a 88 year old female with history of CVA, non-insulin-dependent diabetes mellitus, dementia, paroxysmal atrial fibrillation, hypertension, anxiety, severe protein calorie malnutrition, pressure injury of the skin, hyperlipidemia, recent hospitalization for altered mental status secondary to DKA, recent AKI, recent hypernatremia, recent sirs, who presents emergency department from [facility] for chief concerns of altered mental status, worsening weakness, vomiting. ? ?The patient was made comfort care measures by the family.  Currently awaiting hospice home bed availability.  Patient previously following commands, but in the last 24 hours, has become all but nonresponsive. ? ? ? ? ? ?Assessment and Plan: ?Assessment and Plan: ?* End of life care ?Patient with comfort care measures.  As needed medications for comfort.  Hospice bed available 4/25, however patient is now nonresponsive and has periods of apnea.  Hospice bed available on 4/25, but family declined, because of concern that she is actively dying.  I would agree and in discussion with family, discussed the following: If the patient has not passed in the next 24 hours, by 4/26, will reassess her vitals and if she appears to be more stable, then we will look at transferring her to a hospice facility. ? ?Type 2 diabetes mellitus with hyperlipidemia ([place]) ?Recent hospitalization for DKA.  No further treatments for diabetes since comfort care measures.  Holding cholesterol medication at this time. ? ?Protein-calorie malnutrition, severe ?With failure to thrive patient has severe protein calorie malnutrition. ? ?Nausea & vomiting ?As needed medications for nausea vomiting. ? ?Thrombocytopenia ([place]) ?Last platelet count 125. ? ?Hyponatremia ?Last sodium only  two points lower than normal range. ? ?Atrial fibrillation with rapid ventricular response ([place]) ?Heart rate elevated this morning.  No treatment for this with being comfort care measures. ? ?Pressure injury of buttock, stage 1 ?Present on admission, see full description below. ? ?Prolonged QT interval ?No treatment for this, since comfort care measures. ? ? ? ? ? ? ?There is no height or weight on file to calculate BMI.  ?  ?Pressure Injury 10/22/21 Foot Right (Active)  ?10/22/21 2300  ?Location: Foot  ?Location Orientation: Right  ?Staging:   ?Wound Description (Comments):   ?Present on Admission: Yes  ?Dressing Type Foam - Lift dressing to assess site every shift 11/10/21 2030  ?   ?Pressure Injury 10/28/21 Buttocks Left;Mid Stage 1 -  Intact skin with non-blanchable redness of a localized area usually over a bony prominence. (Active)  ?10/28/21 1300  ?Location: Buttocks  ?Location Orientation: Left;Mid  ?Staging: Stage 1 -  Intact skin with non-blanchable redness of a localized area usually over a bony prominence.  ?Wound Description (Comments):   ?Present on Admission:   ?Dressing Type Foam - Lift dressing to assess site every shift 11/10/21 2030  ?  ? ?Consultants: ?None ? ?Procedures: ?None ? ?Antimicrobials: ?None ? ?Code Status: DNR/comfort care ? ? ?Subjective: Patient does not respond to my voice or commands. ? ?Objective: ?Episodes of apnea ?Vitals:  ? 11/10/21 0745 11/11/21 0937  ?BP: (!) 167/75 (!) 155/84  ?Pulse: 65 (!) 54  ?Resp: 18 (!) 6  ?Temp:  98.9 ?F (37.2 ?C)  ?SpO2: 97% 92%  ? ? ?Intake/Output Summary (Last 24 hours) at 11/11/2021 1455 ?Last data filed at 11/11/2021 0510 ?Gross per 24 hour  ?Intake --  ?Output 100 ml  ?  Net -100 ml  ? ?There were no vitals filed for this visit. ?There is no height or weight on file to calculate BMI. ? ?Exam: ? ?General: Not really responsive, eyes are closed ?Cardiovascular: Regular rate and rhythm, S1-S2 ?Respiratory: Breathing is irregular,  apneic ?Musculoskeletal: No clubbing or cyanosis or edema ? ?Data Reviewed: ?No labs, comfort care ? ?Disposition:  ?Status is: Inpatient ?Remains inpatient appropriate because: Potential death in the next 24 hours ?  ? ?Anticipated discharge date: If patient does not expire then, we will look at transferring her to hospice facility on 4/26 if beds are available ? ?Family Communication: Daughter and granddaughter at the bedside ?DVT Prophylaxis: ?  None, patient is comfort care ? ? ? ?Author: ?Annita Brod ,MD ?11/11/2021 2:55 PM ? ?To reach On-call, see care teams to locate the attending and reach out via [email or web address]. ?Between 7PM-7AM, please contact night-coverage ?If you still have difficulty reaching the attending provider, please page the [facility] (Director on Call) for [facility] on amion for assistance. ? ?

## 2021-11-11 NOTE — Telephone Encounter (Signed)
Patient assistance canceled as patient DPR requested. ?

## 2021-11-11 NOTE — Care Management Important Message (Signed)
Important Message ? ?Patient Details  ?Name: Angela Patterson ?MRN: [id number] ?Date of Birth: [birth year] ? ? ?Medicare Important Message Given:  Other (see comment) ? ?Patient is on comfort care with plan to discharge to [facility] when bed available. Out of respect for the patient and family no Important Message from [facility] given. ? ? ?Juliann Pulse A Yittel Emrich ?11/11/2021, 7:48 AM ?

## 2021-11-11 NOTE — TOC Progression Note (Signed)
Transition of Care (TOC) - Progression Note  ? ? ?Patient Details  ?Name: RHESA FORSBERG ?MRN: [id number] ?Date of Birth: 01-Nov-1920 ? ?Transition of Care (TOC) CM/SW Contact  ?Pete Pelt, RN ?Phone Number: ?11/11/2021, 9:49 AM ? ?Clinical Narrative:   Family declined hospice bed.   ? ? ? ?  ?  ? ?Expected Discharge Plan and Services ?  ?  ?  ?  ?  ?                ?  ?  ?  ?  ?  ?  ?  ?  ?  ?  ? ? ?Social Determinants of Health (SDOH) Interventions ?  ? ?Readmission Risk Interventions ?   ? View : No data to display.  ?  ?  ?  ? ? ?

## 2021-11-11 NOTE — TOC Progression Note (Signed)
Transition of Care (TOC) - Progression Note  ? ? ?Patient Details  ?Name: JOYCEANN KRUSER ?MRN: [id number] ?Date of Birth: 12-Nov-1920 ? ?Transition of Care (TOC) CM/SW Contact  ?Pete Pelt, RN ?Phone Number: ?11/11/2021, 12:08 PM ? ?Clinical Narrative:    Family declined hospice bed.  Condition has declined.  Possible in hospital death. ? ? ? ?  ?  ? ?Expected Discharge Plan and Services ?  ?  ?  ?  ?  ?                ?  ?  ?  ?  ?  ?  ?  ?  ?  ?  ? ? ?Social Determinants of Health (SDOH) Interventions ?  ? ?Readmission Risk Interventions ?   ? View : No data to display.  ?  ?  ?  ? ? ?

## 2021-11-11 NOTE — Progress Notes (Signed)
[facility]) Hospital Liaison Note ? ?Bed offered to family at the [facility]. At this time, bed offer was declined as family prefers for patient to be a hospital death. IDT notified.  ? ?Please do not hesitate to call with any hospice related questions.  ?  ?Thank you for the opportunity to participate in this patient's care. ?  ?Daphene Calamity, MSW ?[place] ?[phone] ?

## 2021-11-12 DIAGNOSIS — Z515 Encounter for palliative care: Secondary | ICD-10-CM | POA: Diagnosis not present

## 2021-11-12 NOTE — Progress Notes (Signed)
[place] [facility]) Hospital Liaison Note ?  ?At this time, family prefers for patient to be a hospital death. IDT notified. ACC to continue to follow if family wishes to transition to [place]  ?  ?Please do not hesitate to call with any hospice related questions.  ?  ?Thank you for the opportunity to participate in this patient's care. ?  ?Daphene Calamity, MSW ?[place] ?[phone] ?

## 2021-11-12 NOTE — Progress Notes (Signed)
Rounded on patient. Breaths per minute has decreased to 4-6. Patient appears to be comfortable after receiving the dilaudid given at family's request. Patient's breathing had increased effort and was noisy before giving the dilaudid. ?

## 2021-11-12 NOTE — TOC Progression Note (Signed)
Transition of Care (TOC) - Progression Note  ? ? ?Patient Details  ?Name: Angela Patterson ?MRN: [id number] ?Date of Birth: January 27, 1921 ? ?Transition of Care (TOC) CM/SW Contact  ?Pete Pelt, RN ?Phone Number: ?11/12/2021, 3:51 PM ? ?Clinical Narrative:   According to care team, anticipate in hospital passing. ? ? ? ?  ?  ? ?Expected Discharge Plan and Services ?  ?  ?  ?  ?  ?                ?  ?  ?  ?  ?  ?  ?  ?  ?  ?  ? ? ?Social Determinants of Health (SDOH) Interventions ?  ? ?Readmission Risk Interventions ?   ? View : No data to display.  ?  ?  ?  ? ? ?

## 2021-11-12 NOTE — Progress Notes (Signed)
?Progress Note ? ? ?Patient: Angela Patterson AUQ:[id number] DOB: [birth year] DOA: 10/18/2021     10 ?DOS: the patient was seen and examined on 11/12/2021 ?  ?Brief hospital course: ?This 88 year old female with history of CVA, non-insulin-dependent diabetes mellitus, dementia, paroxysmal atrial fibrillation, hypertension, anxiety, severe protein calorie malnutrition, pressure injury of the skin, hyperlipidemia, recent hospitalization for altered mental status secondary to DKA, recent AKI, recent hypernatremia, recent sirs, who presents to the emergency department from [facility] with chief concerns about altered mental status, worsening weakness, vomiting. ? ?The patient was made comfort care measures by the family.  Currently awaiting hospice home bed availability.  Patient previously following commands, but in the last 24 hours, has become nonresponsive.  Anticipate hospital death. ? ? ? ? ? ?Assessment and Plan: ?* End of life care ?Patient with comfort care measures.  As needed medications for comfort.   ?Hospice bed available on 4/25, however patient is now nonresponsive and has periods of apnea.   ?Family declined, because of concern that she is actively dying. We would agree and in discussion with family, discussed the following: If the patient has not passed in the next 24 hours, by 4/26, will reassess her vitals and if she appears to be more stable, then we will look at transferring her to a hospice facility. ? ?Type 2 diabetes mellitus with hyperlipidemia ([place]) ?Recent hospitalization for DKA.  ?No further treatments for diabetes since comfort care measures.   ?Holding cholesterol medication at this time. ? ?Protein-calorie malnutrition, severe ?With failure to thrive patient has severe protein calorie malnutrition. ? ?Nausea & vomiting ?Continue Zofran as needed for nausea and vomiting. ? ?Thrombocytopenia ([place]) ?Last platelet count 125. ? ?Hyponatremia ?Last sodium only two points lower than normal  range. ? ?Atrial fibrillation with rapid ventricular response ([place]) ?Heart rate elevated this morning.  No treatment for this with being comfort care measures. ? ?Pressure injury of buttock, stage 1 ?Present on admission, see full description below. ? ?Prolonged QT interval ?No treatment for this, since comfort care measures. ? ? ?Pressure Injury 10/22/21 Foot Right (Active)  ?10/22/21 2300  ?Location: Foot  ?Location Orientation: Right  ?Staging:   ?Wound Description (Comments):   ?Present on Admission: Yes  ?   ?Pressure Injury 10/28/21 Buttocks Left;Mid Stage 1 -  Intact skin with non-blanchable redness of a localized area usually over a bony prominence. (Active)  ?10/28/21 1300  ?Location: Buttocks  ?Location Orientation: Left;Mid  ?Staging: Stage 1 -  Intact skin with non-blanchable redness of a localized area usually over a bony prominence.  ?Wound Description (Comments):   ?Present on Admission:   ? ? ?Subjective: Patient was seen and examined at bedside.  Overnight events noted. ?Patient opens eyes on calling her name otherwise remains nonverbal. ?Patient is comfort care, anticipate hospital death. ? ?Physical Exam: ?Vitals:  ? 11/10/21 0745 11/11/21 6389 11/12/21 0458 11/12/21 0808  ?BP: (!) 167/75 (!) 155/84 (!) 154/58 140/70  ?Pulse: 65 (!) 54 (!) 59 (!) 46  ?Resp: 18 (!) 6 18 (!) 6  ?Temp:  98.9 ?F (37.2 ?C) 98 ?F (36.7 ?C) 98 ?F (36.7 ?C)  ?TempSrc:   Oral Axillary  ?SpO2: 97% 92% 91% (!) 89%  ? ?General exam: Appears comfortable, open eyes on calling her name, not in distress. ?Respiratory system: CTA bilaterally, normal respiratory effort. ?Cardiovascular system: S1-S2 heard, regular rate and rhythm, no murmur ?Gastrointestinal system: Abdomen is soft, non tender, non distended, BS+ ?Central nervous system: Arousable, less responsive, open  eyes on calling name and then closes eyes ?Extremities: No edema, no cyanosis, no clubbing ?Psychiatry: Mood, insight, judgment not assessed ? ?Data Reviewed: ?I  have reviewed the last note from hospitalist,  ?I have discussed pt's care plan and test results with RN.  ? ?Family Communication: No family at bedside ? ?Disposition: ?Status is: Inpatient ?Remains inpatient appropriate because:  ? ?Patient is comfort care, anticipate hospital death. ? ? ? Planned Discharge Destination:  Hospice home/facility ? ? ? ?Time spent: 35 minutes ? ?Author: ?Shawna Clamp, MD ?11/12/2021 12:32 PM ? ?For on call review [email or web address].  ?

## 2021-11-13 DIAGNOSIS — Z515 Encounter for palliative care: Secondary | ICD-10-CM | POA: Diagnosis not present

## 2021-11-13 NOTE — Progress Notes (Signed)
?Progress Note ? ? ?Patient: Angela Patterson DOB: Feb 16, 1921 DOA: 10/29/2021     11 ?DOS: the patient was seen and examined on 11/13/2021 ?  ?Brief hospital course: ?This 88 year old female with history of CVA, non-insulin-dependent diabetes mellitus, dementia, paroxysmal atrial fibrillation, hypertension, anxiety, severe protein calorie malnutrition, pressure injury of the skin, hyperlipidemia, recent hospitalization for altered mental status secondary to DKA, recent AKI, recent hypernatremia, recent sirs, who presents to the emergency department from [facility] with chief concerns about altered mental status, worsening weakness, vomiting. ? ?The patient was made comfort care measures by the family.  Currently awaiting hospice home bed availability.  Patient previously following commands, but in the last 24 hours, has become nonresponsive.  Anticipate hospital death. ? ? ? ? ?Assessment and Plan: ?* End of life care ?Patient with comfort care measures.  As needed medications for comfort.   ?Hospice bed available on 4/25, however patient is now nonresponsive and has periods of apnea.   ?Family declined, because of concern that she is actively dying.  ?Anticipate hospital death. ? ?Type 2 diabetes mellitus with hyperlipidemia ([place]) ?Recent hospitalization for DKA.  ?No further treatments for diabetes since comfort care measures.   ?Holding cholesterol medication at this time. ? ?Protein-calorie malnutrition, severe ?With failure to thrive patient has severe protein calorie malnutrition. ? ?Nausea & vomiting ?Continue Zofran as needed for nausea and vomiting. ? ?Thrombocytopenia ([place]) ?Last platelet count 125. ? ?Hyponatremia ?Last sodium only two points lower than normal range. ? ?Atrial fibrillation with rapid ventricular response ([place]) ?Heart rate elevated this morning.  No treatment for this with being comfort care measures. ? ?Pressure injury of buttock, stage 1 ?Present on admission, see full  description below. ? ?Prolonged QT interval ?No treatment for this, since comfort care measures. ? ? ?Pressure Injury 10/22/21 Foot Right (Active)  ?10/22/21 2300  ?Location: Foot  ?Location Orientation: Right  ?Staging:   ?Wound Description (Comments):   ?Present on Admission: Yes  ?   ?Pressure Injury 10/28/21 Buttocks Left;Mid Stage 1 -  Intact skin with non-blanchable redness of a localized area usually over a bony prominence. (Active)  ?10/28/21 1300  ?Location: Buttocks  ?Location Orientation: Left;Mid  ?Staging: Stage 1 -  Intact skin with non-blanchable redness of a localized area usually over a bony prominence.  ?Wound Description (Comments):   ?Present on Admission:   ? ? ?Subjective: Patient was seen and examined at bedside.  Overnight events noted. ?Patient is nonverbal, not responsive. She is lying comfortably on comfort care. ?Patient is comfort care, anticipate hospital death. ? ?Physical Exam: ?Vitals:  ? 11/12/21 0458 11/12/21 3419 11/13/21 0844 11/13/21 0852  ?BP: (!) 154/58 140/70 129/78   ?Pulse: (!) 59 (!) 46 (!) 59   ?Resp: 18 (!) 6 11   ?Temp: 98 ?F (36.7 ?C) 98 ?F (36.7 ?C)  100 ?F (37.8 ?C)  ?TempSrc: Oral Axillary  Axillary  ?SpO2: 91% (!) 89% 93%   ? ?General exam: Appears comfortable, frail, chronically ill looking, not in distress ?Respiratory system: CTA bilaterally, normal respiratory effort.  RR 12 ?Cardiovascular system: S1-S2 heard, regular rate and rhythm, no murmur ?Gastrointestinal system: Abdomen is soft, non tender, non distended, BS+ ?Central nervous system: Nonverbal, not responsive, comfortable. ?Extremities: No edema, no cyanosis, no clubbing ?Psychiatry: Mood, insight, judgment not assessed ? ?Data Reviewed: ?I have Reviewed nursing notes, Vitals, and Lab results since pt's last encounter. Pertinent lab results recent labs ?I have discussed pt's care plan and test results with  family.  ? ?Family Communication: Family at bedside ? ?Disposition: ?Status is:  Inpatient ?Remains inpatient appropriate because:  ? ?Patient is comfort care, anticipate hospital death. ? ? ? Planned Discharge Destination:  Hospice home/facility ? ? ? ? ?Time spent: 35 minutes ? ?Author: ?Shawna Clamp, MD ?11/13/2021 11:35 AM ? ?For on call review [email or web address]  ?

## 2021-11-13 NOTE — Progress Notes (Signed)
[facility]) Hospital Liaison Note ?  ?At this time, family prefers for patient to be a hospital death. IDT notified. ACC to continue to follow if family wishes to transition to [place]  ?  ?Please do not hesitate to call with any hospice related questions.  ?  ?Thank you for the opportunity to participate in this patient's care. ?  ?Daphene Calamity, MSW ?[place] ?[phone] ?

## 2021-11-13 NOTE — Progress Notes (Signed)
Completed a round on patient around 0640. Patient continues to rest w/o s/s of pain present. Checked patient and she is dry. Spoke with daughter Judeen Hammans 2x during shift to give updates. Family will be by later. ?

## 2021-11-13 NOTE — Progress Notes (Signed)
Rounded on patient. She is in a restful state. No s/s of pain present. Bradypnea still present.   ?

## 2021-11-17 NOTE — Progress Notes (Signed)
Per family request, chaplain offered a life review and prayer a time of death. Family grieving well and thankful for care provided at [facility] for 12 days.  ? ? ? 19-Nov-2021 0400  ?Clinical Encounter Type  ?Visited With Patient and family together  ?Visit Type Death  ? ? ?

## 2021-11-17 NOTE — Death Summary Note (Addendum)
?Death Summary  ?Angela Patterson JJO:[id number] DOB: [birth year] DOA: 10/28/2021 ? ?PCP: Einar Pheasant, MD ? ?Admit date: 10/25/2021 ?Date of Death: 11/26/21 ? ?Time of Death: 02:34 am ?Notification: Einar Pheasant, MD notified of death of 2021/11/26 ? ? ?History of present illness:  ?Angela Patterson is a 88 y.o. female with  history of CVA, non-insulin-dependent diabetes mellitus, dementia, paroxysmal atrial fibrillation, hypertension, anxiety, severe protein calorie malnutrition, pressure injury of the skin, hyperlipidemia, recent hospitalization for altered mental status secondary to DKA, recent AKI, recent hypernatremia, recent sirs, who presents to the emergency department from [facility] with chief concerns about altered mental status, worsening weakness, vomiting. The patient was made comfort care measures by the family.  Currently awaiting hospice home bed availability.  Patient previously following commands, but in the last 24 hours, has become nonresponsive.  Patient died and patient was pronounced dead at 2:34 in the morning. ? ? ?Final Diagnoses:  ?Metabolic encephalopathy ?Severe protein calorie malnutrition ?CVA ?Advanced Dementia. ? ? ?The results of significant diagnostics from this hospitalization (including imaging, microbiology, ancillary and laboratory) are listed below for reference.   ? ?Significant Diagnostic Studies: ?CT ABDOMEN PELVIS WO CONTRAST ? ?Result Date: 14-Nov-2021 ?CLINICAL DATA:  Nausea, vomiting, abdominal pain EXAM: CT ABDOMEN AND PELVIS WITHOUT CONTRAST TECHNIQUE: Multidetector CT imaging of the abdomen and pelvis was performed following the standard protocol without IV contrast. RADIATION DOSE REDUCTION: This exam was performed according to the departmental dose-optimization program which includes automated exposure control, adjustment of the mA and/or kV according to patient size and/or use of iterative reconstruction technique. COMPARISON:  10/22/2021 FINDINGS: Lower chest:  Heart is enlarged in size. Breathing motion limits evaluation of lower lung fields. Increased interstitial markings are seen in the the periphery of lower lung fields, possibly interstitial lung disease. Coronary artery calcifications are seen. Hepatobiliary: Liver measures 17.1 cm in length. Small calcification is seen in the right lobe. Gallbladder is unremarkable. Pancreas: There is coarse calcification in the tail of pancreas. There is pancreatic atrophy. Spleen: Unremarkable. Adrenals/Urinary Tract: There is mild hyperplasia of adrenals. There is no hydronephrosis. There are no renal or ureteral stones. Urinary bladder is not distended. Stomach/Bowel: Stomach is unremarkable. Small bowel loops are not dilated. Appendix is not distinctly seen. There is no focal pericecal inflammation. There is no significant wall thickening in colon. There is no pericolic stranding. Vascular/Lymphatic: Extensive arterial calcifications are seen. There is ectasia of infrarenal aorta measuring 2.3 cm in diameter. Reproductive: Uterus is not seen.  There are no adnexal masses. Other: There is no ascites or pneumoperitoneum. Musculoskeletal: Degenerative changes are noted in the lumbar spine, particularly severe at L2-L3 and L3-L4 levels. There is spinal stenosis at multiple levels, more so at L4-L5 level. There is encroachment of neural foramina at multiple levels. IMPRESSION: There is no evidence of intestinal obstruction or pneumoperitoneum. There is no hydronephrosis. Extensive atherosclerotic changes are noted. There is 2.3 cm ectasia in the infrarenal aorta. Coronary artery calcifications are seen. Cardiomegaly. Lumbar spondylosis. Other findings as described in the body of the report. Electronically Signed   By: Elmer Picker M.D.   On: 10/25/2021 12:22  ? ?CT Abdomen Pelvis Wo Contrast ? ?Result Date: 10/22/2021 ?CLINICAL DATA:  88 year old with abdominal pain. EXAM: CT ABDOMEN AND PELVIS WITHOUT CONTRAST TECHNIQUE:  Multidetector CT imaging of the abdomen and pelvis was performed following the standard protocol without IV contrast. RADIATION DOSE REDUCTION: This exam was performed according to the departmental dose-optimization program which includes automated exposure control,  adjustment of the mA and/or kV according to patient size and/or use of iterative reconstruction technique. COMPARISON:  None. FINDINGS: Lower chest: Subpleural reticulation in the lung bases. Mild cardiomegaly. Hepatobiliary: Motion artifact limits assessment. No focal liver abnormality is seen. Punctate hepatic granuloma. Physiologically distended gallbladder without calcified gallstone. No biliary dilatation. Pancreas: Atrophic.  No ductal dilatation or inflammation. Spleen: Normal in size without focal abnormality. Adrenals/Urinary Tract: No adrenal nodule. There is prominence of both renal collecting systems, right greater than left. The urinary bladder is distension, possibly source of renal collecting system dilatation. Mild left renal parenchymal atrophy. No urolithiasis. No bladder wall thickening. No focal renal lesion on this unenhanced exam. Stomach/Bowel: Detailed bowel assessment is limited in the absence of enteric contrast and presence of patient motion artifact. The stomach is decompressed. No small bowel obstruction. No small bowel inflammation. The appendix is not confidently seen. No appendicitis. Moderate volume of colonic stool. There is colonic redundancy. No colonic wall thickening or inflammation. No diverticulitis. Vascular/Lymphatic: Advanced aortic atherosclerosis. No aortic aneurysm. No portal venous or mesenteric gas. No abdominopelvic adenopathy. Reproductive: Hysterectomy.  No adnexal mass. Other: No free air or ascites. Small fat containing left inguinal hernia. Diminutive fat containing umbilical hernia. Musculoskeletal: Lumbar degenerative change most prominent at L2-L3 and L3-L4. Diffuse bony under mineralization. No  acute osseous findings. IMPRESSION: 1. Distended urinary bladder with prominence of both renal collecting systems, right greater than left, possibly related to bladder distension. Recommend correlation with urinalysis. 2. Moderate volume of colonic stool with colonic redundancy, suggesting constipation. No bowel obstruction or inflammation. Aortic Atherosclerosis (ICD10-I70.0). Electronically Signed   By: Keith Rake M.D.   On: 10/22/2021 19:04  ? ?DG Chest 2 View ? ?Result Date: 10/22/2021 ?CLINICAL DATA:  Weakness EXAM: CHEST - 2 VIEW COMPARISON:  None. FINDINGS: Lungs are well expanded, symmetric, and clear. No pneumothorax or pleural effusion. Cardiac size within normal limits. Pulmonary vascularity is normal. Osseous structures are age-appropriate. No acute bone abnormality. IMPRESSION: No active cardiopulmonary disease. Electronically Signed   By: Fidela Salisbury M.D.   On: 10/22/2021 19:06  ? ?CT Head Wo Contrast ? ?Result Date: 11/06/2021 ?CLINICAL DATA:  Trauma, altered mental status EXAM: CT HEAD WITHOUT CONTRAST TECHNIQUE: Contiguous axial images were obtained from the base of the skull through the vertex without intravenous contrast. RADIATION DOSE REDUCTION: This exam was performed according to the departmental dose-optimization program which includes automated exposure control, adjustment of the mA and/or kV according to patient size and/or use of iterative reconstruction technique. COMPARISON:  10/22/2021 FINDINGS: Brain: No acute intracranial findings are seen. There is prominence of third and both lateral ventricles. Cortical sulci are prominent. There is decreased density in the periventricular and subcortical white matter. Vascular: There are scattered arterial calcifications. Skull: No fracture is seen in the calvarium. Sinuses/Orbits: Unremarkable. Other: None IMPRESSION: No acute intracranial findings are seen in noncontrast CT brain. Atrophy. Small-vessel disease. Electronically Signed   By:  Elmer Picker M.D.   On: 11/11/2021 12:30  ? ?CT Head Wo Contrast ? ?Result Date: 10/22/2021 ?CLINICAL DATA:  Altered mental status EXAM: CT HEAD WITHOUT CONTRAST TECHNIQUE: Contiguous axial images were o

## 2021-11-17 NOTE — Progress Notes (Signed)
[address] and spoke with [employer]. She stated that patient wasn't a donor candidate based on age. Reference number is [id number]. ?

## 2021-11-17 NOTE — Progress Notes (Signed)
Checked on patient and she wasn't breathing but was still warm. Checked apical pulse with stethoscope and no heartbeat could be heard. Verified death with charge nurse Kennyth Lose. Time of death is 72. Notified Sharion Settler, NP and [name] (patient's daughter). [name] stated that they would be using Vella Redhead [phone]). ?

## 2021-11-17 NOTE — Progress Notes (Signed)
Cross Cover ?Patient with comfort care goals passed away 0234 AMon this day 12-11-21.  Death pronounced by 2 RNs ?

## 2021-11-17 DEATH — deceased

## 2021-11-18 ENCOUNTER — Telehealth: Payer: Self-pay | Admitting: Podiatry

## 2021-11-18 NOTE — Telephone Encounter (Signed)
Pts granddaughter called to let you personally know that pt passed away. She said she loved you and our entire staff. ?

## 2021-12-16 ENCOUNTER — Telehealth: Payer: Medicare Other

## 2022-01-09 ENCOUNTER — Other Ambulatory Visit: Payer: Self-pay | Admitting: Internal Medicine

## 2023-05-05 IMAGING — DX DG CHEST 2V
2 series · 2 of 2 positions shown · non-contrast
Comparison: Chest x-ray dated October 18, 2020.

CLINICAL DATA: Pneumonia follow-up.

EXAM:
CHEST - 2 VIEW

[chest pa]
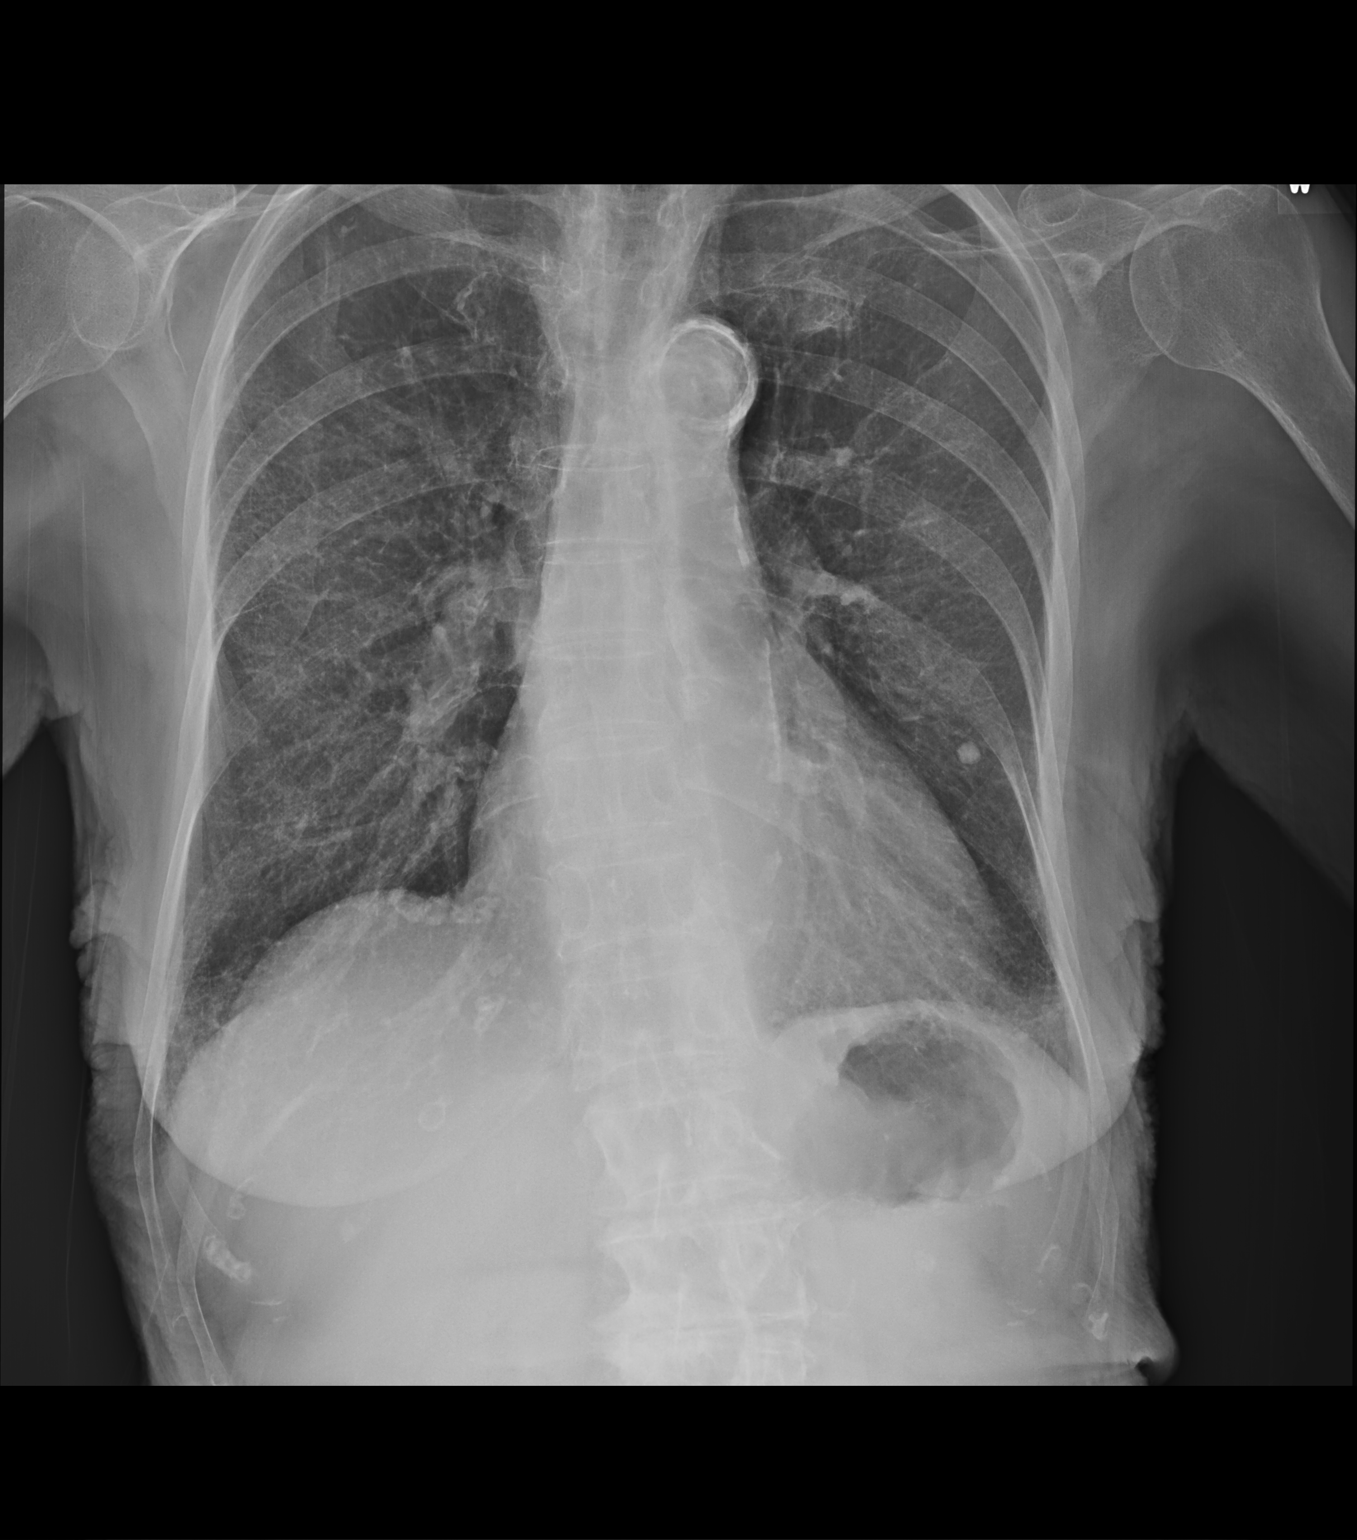

[chest lat]
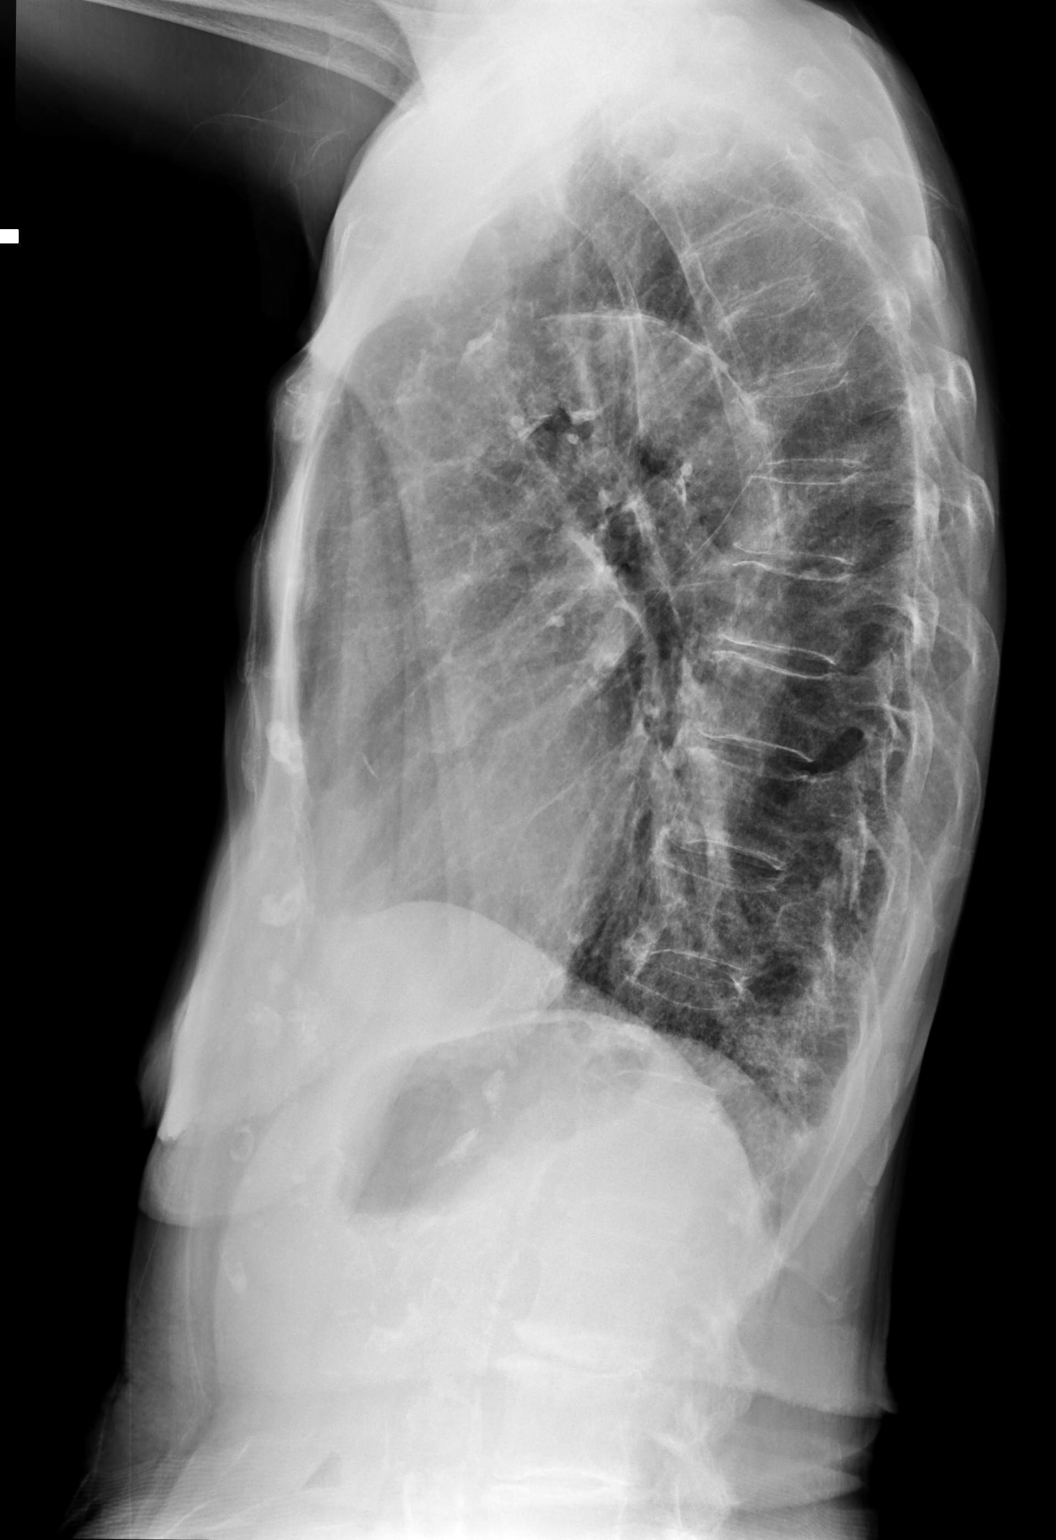

[2 of 2 positions shown; findings below may reference images not displayed]

FINDINGS: Unchanged mild cardiomegaly. Chronic interstitial coarsening.
Resolved right lower lobe pneumonia. Unchanged small calcified
granuloma in the left lung. No focal consolidation, pleural
effusion, or pneumothorax. No acute osseous abnormality.
IMPRESSION: 1. Resolved right lower lobe pneumonia.

## 2024-06-06 NOTE — Telephone Encounter (Signed)
 open in error
